# Patient Record
Sex: Female | Born: 1935 | Race: Black or African American | Hispanic: No | Smoking: Never smoker
Health system: Southern US, Community
[De-identification: ages and names within clinical notes are randomized; demographics above are authoritative.]

## PROBLEM LIST (undated history)

## (undated) DIAGNOSIS — N1832 Chronic kidney disease, stage 3b: Secondary | ICD-10-CM

## (undated) DIAGNOSIS — K219 Gastro-esophageal reflux disease without esophagitis: Secondary | ICD-10-CM

## (undated) DIAGNOSIS — I509 Heart failure, unspecified: Secondary | ICD-10-CM

## (undated) DIAGNOSIS — F419 Anxiety disorder, unspecified: Secondary | ICD-10-CM

## (undated) DIAGNOSIS — B351 Tinea unguium: Secondary | ICD-10-CM

## (undated) DIAGNOSIS — Z951 Presence of aortocoronary bypass graft: Secondary | ICD-10-CM

## (undated) DIAGNOSIS — M109 Gout, unspecified: Secondary | ICD-10-CM

## (undated) DIAGNOSIS — Z8701 Personal history of pneumonia (recurrent): Secondary | ICD-10-CM

## (undated) DIAGNOSIS — Z950 Presence of cardiac pacemaker: Secondary | ICD-10-CM

## (undated) DIAGNOSIS — I480 Paroxysmal atrial fibrillation: Secondary | ICD-10-CM

## (undated) DIAGNOSIS — E785 Hyperlipidemia, unspecified: Secondary | ICD-10-CM

## (undated) DIAGNOSIS — N183 Chronic kidney disease, stage 3 unspecified: Secondary | ICD-10-CM

## (undated) DIAGNOSIS — I2119 ST elevation (STEMI) myocardial infarction involving other coronary artery of inferior wall: Secondary | ICD-10-CM

## (undated) DIAGNOSIS — F329 Major depressive disorder, single episode, unspecified: Secondary | ICD-10-CM

## (undated) DIAGNOSIS — R600 Localized edema: Secondary | ICD-10-CM

## (undated) DIAGNOSIS — R51 Headache: Secondary | ICD-10-CM

## (undated) DIAGNOSIS — I255 Ischemic cardiomyopathy: Secondary | ICD-10-CM

## (undated) DIAGNOSIS — E039 Hypothyroidism, unspecified: Secondary | ICD-10-CM

## (undated) DIAGNOSIS — R410 Disorientation, unspecified: Secondary | ICD-10-CM

## (undated) DIAGNOSIS — I2510 Atherosclerotic heart disease of native coronary artery without angina pectoris: Secondary | ICD-10-CM

## (undated) DIAGNOSIS — E1129 Type 2 diabetes mellitus with other diabetic kidney complication: Secondary | ICD-10-CM

## (undated) DIAGNOSIS — I10 Essential (primary) hypertension: Secondary | ICD-10-CM

## (undated) DIAGNOSIS — Z9861 Coronary angioplasty status: Secondary | ICD-10-CM

## (undated) DIAGNOSIS — M1990 Unspecified osteoarthritis, unspecified site: Secondary | ICD-10-CM

## (undated) HISTORY — DX: Chronic kidney disease, stage 3b: N18.32

## (undated) HISTORY — DX: Ischemic cardiomyopathy: I25.5

## (undated) HISTORY — PX: CARDIAC CATHETERIZATION: SHX172

## (undated) HISTORY — DX: Presence of aortocoronary bypass graft: Z95.1

## (undated) HISTORY — DX: Paroxysmal atrial fibrillation: I48.0

## (undated) HISTORY — DX: Coronary angioplasty status: Z98.61

## (undated) HISTORY — DX: Personal history of pneumonia (recurrent): Z87.01

## (undated) HISTORY — DX: Type 2 diabetes mellitus with other diabetic kidney complication: E11.29

## (undated) HISTORY — DX: ST elevation (STEMI) myocardial infarction involving other coronary artery of inferior wall: I21.19

## (undated) HISTORY — DX: Unspecified osteoarthritis, unspecified site: M19.90

## (undated) HISTORY — DX: Essential (primary) hypertension: I10

## (undated) HISTORY — DX: Atherosclerotic heart disease of native coronary artery without angina pectoris: I25.10

## (undated) HISTORY — DX: Gastro-esophageal reflux disease without esophagitis: K21.9

## (undated) HISTORY — DX: Gout, unspecified: M10.9

## (undated) HISTORY — DX: Hyperlipidemia, unspecified: E78.5

## (undated) HISTORY — DX: Localized edema: R60.0

## (undated) HISTORY — PX: JOINT REPLACEMENT: SHX530

## (undated) HISTORY — DX: Chronic kidney disease, stage 3 (moderate): N18.3

## (undated) HISTORY — DX: Tinea unguium: B35.1

---

## 1998-04-29 ENCOUNTER — Other Ambulatory Visit: Admission: RE | Admit: 1998-04-29 | Discharge: 1998-04-29 | Payer: Self-pay | Admitting: *Deleted

## 1998-06-17 ENCOUNTER — Encounter: Payer: Self-pay | Admitting: Endocrinology

## 1998-06-17 ENCOUNTER — Ambulatory Visit (HOSPITAL_COMMUNITY): Admission: RE | Admit: 1998-06-17 | Discharge: 1998-06-17 | Payer: Self-pay | Admitting: Endocrinology

## 1999-04-27 ENCOUNTER — Other Ambulatory Visit: Admission: RE | Admit: 1999-04-27 | Discharge: 1999-04-27 | Payer: Self-pay | Admitting: *Deleted

## 1999-07-26 ENCOUNTER — Encounter: Payer: Self-pay | Admitting: Endocrinology

## 1999-07-26 ENCOUNTER — Encounter: Admission: RE | Admit: 1999-07-26 | Discharge: 1999-07-26 | Payer: Self-pay | Admitting: Endocrinology

## 2000-05-24 ENCOUNTER — Other Ambulatory Visit: Admission: RE | Admit: 2000-05-24 | Discharge: 2000-05-24 | Payer: Self-pay | Admitting: *Deleted

## 2000-07-26 ENCOUNTER — Encounter: Admission: RE | Admit: 2000-07-26 | Discharge: 2000-07-26 | Payer: Self-pay | Admitting: Endocrinology

## 2000-07-26 ENCOUNTER — Encounter: Payer: Self-pay | Admitting: Endocrinology

## 2001-07-20 ENCOUNTER — Encounter: Payer: Self-pay | Admitting: Endocrinology

## 2001-07-20 ENCOUNTER — Encounter: Admission: RE | Admit: 2001-07-20 | Discharge: 2001-07-20 | Payer: Self-pay | Admitting: Endocrinology

## 2002-07-29 ENCOUNTER — Encounter: Payer: Self-pay | Admitting: Endocrinology

## 2002-07-29 ENCOUNTER — Encounter: Admission: RE | Admit: 2002-07-29 | Discharge: 2002-07-29 | Payer: Self-pay | Admitting: Endocrinology

## 2002-12-09 ENCOUNTER — Encounter: Payer: Self-pay | Admitting: Orthopedic Surgery

## 2002-12-09 ENCOUNTER — Encounter: Admission: RE | Admit: 2002-12-09 | Discharge: 2002-12-09 | Payer: Self-pay | Admitting: Orthopedic Surgery

## 2003-05-14 ENCOUNTER — Encounter: Admission: RE | Admit: 2003-05-14 | Discharge: 2003-05-14 | Payer: Self-pay | Admitting: Orthopedic Surgery

## 2003-06-26 ENCOUNTER — Other Ambulatory Visit: Admission: RE | Admit: 2003-06-26 | Discharge: 2003-06-26 | Payer: Self-pay | Admitting: Endocrinology

## 2003-08-20 ENCOUNTER — Encounter: Admission: RE | Admit: 2003-08-20 | Discharge: 2003-08-20 | Payer: Self-pay | Admitting: Endocrinology

## 2003-09-22 ENCOUNTER — Inpatient Hospital Stay (HOSPITAL_COMMUNITY): Admission: RE | Admit: 2003-09-22 | Discharge: 2003-09-26 | Payer: Self-pay | Admitting: Orthopedic Surgery

## 2004-09-09 ENCOUNTER — Encounter: Admission: RE | Admit: 2004-09-09 | Discharge: 2004-09-09 | Payer: Self-pay | Admitting: Endocrinology

## 2005-09-15 ENCOUNTER — Encounter: Admission: RE | Admit: 2005-09-15 | Discharge: 2005-09-15 | Payer: Self-pay | Admitting: Endocrinology

## 2005-12-28 ENCOUNTER — Encounter: Admission: RE | Admit: 2005-12-28 | Discharge: 2005-12-28 | Payer: Self-pay | Admitting: Orthopedic Surgery

## 2006-09-22 ENCOUNTER — Encounter: Admission: RE | Admit: 2006-09-22 | Discharge: 2006-09-22 | Payer: Self-pay | Admitting: Endocrinology

## 2007-01-01 ENCOUNTER — Inpatient Hospital Stay (HOSPITAL_COMMUNITY): Admission: RE | Admit: 2007-01-01 | Discharge: 2007-01-05 | Payer: Self-pay | Admitting: Orthopedic Surgery

## 2007-01-09 ENCOUNTER — Encounter: Admission: RE | Admit: 2007-01-09 | Discharge: 2007-01-09 | Payer: Self-pay | Admitting: Endocrinology

## 2007-09-26 ENCOUNTER — Encounter: Admission: RE | Admit: 2007-09-26 | Discharge: 2007-09-26 | Payer: Self-pay | Admitting: Endocrinology

## 2008-09-30 ENCOUNTER — Encounter: Admission: RE | Admit: 2008-09-30 | Discharge: 2008-09-30 | Payer: Self-pay | Admitting: Endocrinology

## 2009-10-02 ENCOUNTER — Encounter: Admission: RE | Admit: 2009-10-02 | Discharge: 2009-10-02 | Payer: Self-pay | Admitting: Endocrinology

## 2010-07-23 ENCOUNTER — Emergency Department (HOSPITAL_COMMUNITY)
Admission: EM | Admit: 2010-07-23 | Discharge: 2010-07-23 | Payer: Self-pay | Source: Home / Self Care | Admitting: Emergency Medicine

## 2010-07-26 LAB — POCT CARDIAC MARKERS
CKMB, poc: 2.5 ng/mL (ref 1.0–8.0)
Myoglobin, poc: 277 ng/mL (ref 12–200)
Troponin i, poc: <0.05 ng/mL (ref 0.00–0.09)

## 2010-07-26 LAB — DIFFERENTIAL
Basophils Absolute: 0 10*3/uL (ref 0.0–0.1)
Basophils Relative: 1 % (ref 0–1)
Eosinophils Absolute: 0.3 10*3/uL (ref 0.0–0.7)
Eosinophils Relative: 4 % (ref 0–5)
Lymphocytes Relative: 21 % (ref 12–46)
Lymphs Abs: 1.8 10*3/uL (ref 0.7–4.0)
Monocytes Absolute: 0.7 10*3/uL (ref 0.1–1.0)
Monocytes Relative: 8 % (ref 3–12)
Neutro Abs: 5.5 10*3/uL (ref 1.7–7.7)
Neutrophils Relative %: 66 % (ref 43–77)

## 2010-07-26 LAB — CBC
HCT: 43.6 % (ref 36.0–46.0)
Hemoglobin: 14 g/dL (ref 12.0–15.0)
MCH: 26.7 pg (ref 26.0–34.0)
MCHC: 32.1 g/dL (ref 30.0–36.0)
MCV: 83.2 fL (ref 78.0–100.0)
Platelets: 214 10*3/uL (ref 150–400)
RBC: 5.24 MIL/uL — ABNORMAL HIGH (ref 3.87–5.11)
RDW: 16.6 % — ABNORMAL HIGH (ref 11.5–15.5)
WBC: 8.3 10*3/uL (ref 4.0–10.5)

## 2010-07-26 LAB — BASIC METABOLIC PANEL
BUN: 32 mg/dL — ABNORMAL HIGH (ref 6–23)
CO2: 24 mEq/L (ref 19–32)
Calcium: 9.9 mg/dL (ref 8.4–10.5)
Chloride: 108 mEq/L (ref 96–112)
Creatinine, Ser: 1.95 mg/dL — ABNORMAL HIGH (ref 0.4–1.2)
GFR calc Af Amer: 29 mL/min — ABNORMAL LOW (ref >60)
GFR calc non Af Amer: 24 mL/min — ABNORMAL LOW (ref >60)
Glucose, Bld: 199 mg/dL — ABNORMAL HIGH (ref 70–99)
Potassium: 4 mEq/L (ref 3.5–5.1)
Sodium: 143 mEq/L (ref 135–145)

## 2010-07-26 LAB — BRAIN NATRIURETIC PEPTIDE: Pro B Natriuretic peptide (BNP): <30 pg/mL (ref 0.0–100.0)

## 2010-09-06 ENCOUNTER — Other Ambulatory Visit: Payer: Self-pay | Admitting: Endocrinology

## 2010-09-06 DIAGNOSIS — Z1231 Encounter for screening mammogram for malignant neoplasm of breast: Secondary | ICD-10-CM

## 2010-10-06 ENCOUNTER — Ambulatory Visit
Admission: RE | Admit: 2010-10-06 | Discharge: 2010-10-06 | Disposition: A | Discharge status: Home / Self Care | Payer: Medicare Other | Source: Ambulatory Visit | Attending: Endocrinology | Admitting: Endocrinology

## 2010-10-06 DIAGNOSIS — Z1231 Encounter for screening mammogram for malignant neoplasm of breast: Secondary | ICD-10-CM

## 2010-11-23 NOTE — H&P (Signed)
NAMECHAVA, DULAC             ACCOUNT NO.:  [id number]   MEDICAL RECORD NO.:  [id number]          PATIENT TYPE:  INP   LOCATION:  NA                           FACILITY:  [facility]   PHYSICIAN:  Ollen Gross, M.D.    DATE OF BIRTH:  [birth year]   DATE OF ADMISSION:  01/01/2007  DATE OF DISCHARGE:                              HISTORY & PHYSICAL   DATE OF OFFICE VISIT HISTORY AND PHYSICAL:  12/28/2006   CHIEF COMPLAINT:  Right hip pain.   HISTORY OF PRESENT ILLNESS:  The patient is a 77 year old female who has  had ongoing progressive worsening right hip pain.  It has been treated  conservatively in the past.  She has also undergone intra-articular  injections, only help temporarily.  The pain over the past several  months has progressively gotten worse.  She has reached a point where  she would like to have a hip replacement.  She has had the left side  done back in 2005, now comes in to have the other side.   ALLERGIES:  STATIN DRUGS causes muscle pain and ache.   CURRENT MEDICATIONS:  Welchol, Zetia, Benicar,  Toprol XL, furosemide,  glimepiride,  Lotrel, Prevacid, Synthroid.   PAST MEDICAL HISTORY:  Hypercholesterolemia, hypertension, non-insulin-  dependent diabetes mellitus, hypothyroidism.   PAST SURGICAL HISTORY:  D&C approximately 28 years ago, left total hip  replacement 2005.   SOCIAL HISTORY:  Married, retired, nonsmoker.  No alcohol.  Six  children.   FAMILY HISTORY:  Father with history of heart disease.  Mother with  history of breast cancer and hypertension, sister with diabetes.   REVIEW OF SYSTEMS:  GENERAL: No fevers, chills or night sweats.  NEURO:  No seizures, syncope or paralysis.  RESPIRATORY: No shortness of breath,  productive cough or hemoptysis.  CARDIOVASCULAR:  No chest pain or  orthopnea.  GI: No nausea, vomiting, diarrhea  or constipation.  GU: No  dysuria, hematuria or discharge.  MUSCULOSKELETAL: Right hip.   PHYSICAL EXAM:  VITAL SIGNS:  Pulse 56, respirations 12, blood pressure  134/68.  GENERAL: 77 year old African American female, well-nourished, well-  developed, overweight, no acute distress.  She is alert, oriented and  cooperative, pleasant.  HEENT: Normocephalic, atraumatic.  Pupils round and reactive.  Oropharynx clear.  EOMs intact.  NECK:  Supple.  No bruits.  CHEST: Clear anterior, posterior chest walls.  HEART:  Regular rate and rhythm, no murmur, S1-S2 noted.  ABDOMEN: Soft, nontender.  Bowel sounds present.  Rectal and genitalia  not done and pertinent to present illness.  EXTREMITIES:  Right hip flexion 95 degrees zero, internal rotation zero,  external rotation about 20 degrees abduction.   IMPRESSION:  1. Osteoarthritis right hip  2. Hypertension  3. Hypercholesterolemia  4. Hypothyroidism.  5. Non-insulin-dependent diabetes mellitus.   PLAN:  The patient admitted to [facility] to undergo right  total hip replacement arthroplasty.  Surgery will be performed by Ollen Gross. Her medical physician, Dr. Adela Lank, will be notified of  the room number and admission and will be consulted if needed for  medical assistance for  the patient during the hospital course.      Alexzandrew L. Perkins, P.A.C.      Ollen Gross, M.D.  Electronically Signed    ALP/MEDQ  D:  12/31/2006  T:  12/31/2006  Job:  [id number]   cc:   Brooke Bonito, M.D.  Fax: [phone]   Ollen Gross, M.D.  Fax: [phone]

## 2010-11-23 NOTE — Op Note (Signed)
NAMESIERRA, [name]             ACCOUNT NO.:  [id number]   MEDICAL RECORD NO.:  [id number]          PATIENT TYPE:  INP   LOCATION:  0006                         FACILITY:  [facility]   PHYSICIAN:  Ollen Gross, M.D.    DATE OF BIRTH:  [birth year]   DATE OF PROCEDURE:  01/01/2007  DATE OF DISCHARGE:                               OPERATIVE REPORT   PREOPERATIVE DIAGNOSIS:  Osteoarthritis of the right hip.   POSTOPERATIVE DIAGNOSIS:  Osteoarthritis of the right hip.   PROCEDURE:  Right total hip arthroplasty.   SURGEON:  Ollen Gross, M.D.   ASSISTANT:  Alexzandrew L. Perkins, P.A.C.   ANESTHESIA:  General.   BLOOD LOSS:  400.   DRAINS:  None.   COMPLICATIONS:  None.   CONDITION:  Stable to recovery.   BRIEF CLINICAL NOTE:  Ms. Covington is an 77 year old female with severe  end-stage arthritis of the right hip.  She has had a previous successful  left total hip arthroplasty, and presents now for right total hip  arthroplasty.   PROCEDURE IN DETAIL:  After the successful administration of general  anesthetic, the patient was placed in left lateral decubitus position  with the right side up and held with the hip positioner.  The right  lower extremity was isolated from her perineum with plastic drapes, and  prepped and draped in the usual sterile fashion.  A posterolateral  incision was made with #10 blade, through the subcutaneous tissue to the  level of the fascia lata (which was incised in line with the skin  incision).  The sciatic nerve was palpated and protected, and the short  external rotators isolated off the femur.  A capsulectomy was performed  and the hip was dislocated.  The center of the femoral head was marked  and a trial prosthesis placed, such that the center of the trial head  corresponds to the center of native femoral head.  Osteotomy line was  marked on the femoral neck and osteotomy made with an oscillating saw.  Femoral head was removed and then the  femur retracted anteriorly to gain  acetabular exposure.   Acetabular retractors were placed and then the labrum and osteophytes  removed.  Reaming starts at 45 mm, coursing increments of 2 up to 51 mm.  Then a 52 mm Pinnacle acetabular shell was placed in anatomic position  and transfixed with two dome screws.  A trial 32-mm neutral +4 liner was  placed.   The femur was prepared with the canal finder and irrigation.  Axial  reaming was performed to 13.5 mm; proximal reaming to and 18D, and the  sleeve machined to a large.  An 18D large trial and previously placed  with an 18 x 13 stem, with a standard offset neck filling about 10  degrees beyond her native anteversion.  The 32.0 head was placed and the  hip was reduced, with outstanding stability.  There was full extension,  full external rotation to 70 degrees flexion, 40 degrees adduction, 90  degrees internal rotation, 90 degrees of flexion and 70 degrees of  internal rotation.  By placing  the right leg on top of the left it felt  as though the lengths were equal.  The hips were then dislocated and all  trials were removed.  The permanent apex hole eliminator was placed into  the acetabular shell.  The permanent 32 mm neutral +4 Marathon liner was  placed.  On the femoral side, the permanent 18D large sleeve was placed,  with an 18 x 13 stem and a 36 standard neck going 10 degrees beyond  native anteversion.  The 32 +0 head was placed, and the hip was reduced  with the same stability parameters.  The wounds were copiously irrigated  with saline solution, and the short rotators were reattached to the  femur through drill holes.  The fascia lata was closed over a Hemovac  drain with interrupted #1 Vicryl.  The subcutaneous  was closed with #1  and 2-0 Vicryl and subcuticular running 4-0 Monocryl.  The incision was  cleaned and dried, and Steri-Strips and a bulky sterile dressing  applied.  She was then awakened and transported to  recovery in stable  condition.      Ollen Gross, M.D.  Electronically Signed     FA/MEDQ  D:  01/01/2007  T:  01/02/2007  Job:  [id number]

## 2010-11-26 NOTE — Op Note (Signed)
NAME:  [name], [name]                       ACCOUNT NO.:  [id number]   MEDICAL RECORD NO.:  [id number]                   PATIENT TYPE:  INP   LOCATION:  0009                                 FACILITY:  [facility]   PHYSICIAN:  Ollen Gross, M.D.                 DATE OF BIRTH:  October 27, 1920   DATE OF PROCEDURE:  09/22/2003  DATE OF DISCHARGE:                                 OPERATIVE REPORT   PREOPERATIVE DIAGNOSIS:  Osteoarthritis, left hip.   POSTOPERATIVE DIAGNOSIS:  Osteoarthritis, left hip.   PROCEDURE:  Left total hip arthroplasty.   SURGEON:  Gus Rankin. Aluisio, M.D.   ASSISTANT:  Avel Peace, P.A.   ANESTHESIA:  General.   ESTIMATED BLOOD LOSS:  400.   DRAIN:  Hemovac x 1.   COMPLICATIONS:  None.   CONDITION:  Stable to recovery.   BRIEF CLINICAL NOTE:  Ms. Acres is an 77 year old female with severe end-  stage osteoarthritis of the left hip with pain refractory to nonoperative  management.  She presents now for left total hip arthroplasty.   PROCEDURE IN DETAIL:  After the successful administration of general  anesthetic, the patient is placed in the right lateral decubitus position  with the left side up and held with the hip positioner.  The left lower  extremity is isolated from her perineum with plastic drapes and prepped and  draped in the usual sterile fashion.  Standard posterolateral incision is  made with a 10 blade through a very thick layer of subcutaneous tissue down  to the level of the fascia lata which is incised in line with the skin  incision.  The sciatic nerve is palpated and protected, and the short  external rotator is isolated off the femur.  The capsulectomy is then  performed and the hip dislocated.  Center of the femoral head is marked and  the trial prosthesis placed such that the center of the trial head  corresponds to the center of her native femoral head.  Osteotomy line is  marked on the femoral neck and osteotomy made with an  oscillating saw.  The  femoral head is removed and then the femur retracted anteriorly to gain  acetabular exposure.   Acetabular exposure is obtained.  The retractor is placed.  The labrum and  osteophytes are removed.  Reaming started at 47 mm and coursing up in  increments of 2 to 53.  A 54 mm Pinnacle acetabular shell is placed in  anatomic position and transfixed with two dome screws.  Trial 32 mm neutral  liner is placed.   Femur is prepared first with the canal finder and then irrigation.  Axial  reaming is performed to 13.5 mm, proximal reaming to an 18D, and the sleeve  machined to a large.  An 18D large trial sleeve is placed, with an 18 x 13  stem and a 36 plus 8 neck.  Her version was  relatively neutral, so I added  10 degrees of anteversion.  We then placed a 32 plus 0 trial head and  reduced the hip.  With the 36 plus 8, there was too much offset, and we had  difficult reduction.  We went to a 36 standard which led to a more anatomic  reduction.  She had outstanding stability, full extension, full external  rotation, 70 degrees flexion, 40 degrees adduction, 90 degrees internal  rotation, and 90 degrees flexion, 70 degrees internal rotation.  By placing  the left leg on top of the right, her leg lengths were equal.  All trials  are then removed, and the permanent apex hole eliminator is placed into the  acetabular shell.  The 32 mm neutral Marathon liner is then placed.  The 18D  large sleeve is then placed into the proximal femur with 18 x 13 stem with a  standard neck.  Once again, we went about 10 degrees beyond her native  anteversion.  We then placed a 32 plus 0 head and reduced the hip with the  same stability parameters.  The wound is copiously irrigated with antibiotic  solution, and short rotator is reattached to the femur through drill holes.  Fascia lata is closed over a Hemovac drain with interrupted #1 Vicryl, subcu  closed with #1 and then 2-0 Vicryl, and  subcuticular with running 4-0  Monocryl.  Then 20 mL of 0.25% Marcaine with epinephrine are injected into  the subcutaneous tissues.  Steri-Strips and a bulky sterile dressing are  applied, and then the drain is hooked to the Vacutainer and bulky sterile  dressing applied, and she is placed into a knee immobilizer, awakened, and  transported to recovery in stable condition.                                               Ollen Gross, M.D.    FA/MEDQ  D:  09/22/2003  T:  09/22/2003  Job:  [id number]

## 2010-11-26 NOTE — Discharge Summary (Signed)
[name], [name]             ACCOUNT NO.:  [id number]   MEDICAL RECORD NO.:  [id number]          PATIENT TYPE:  INP   LOCATION:  1620                         FACILITY[facility]   PHYSICIAN:  Ollen Gross, M.D.    DATE OF BIRTH:  1921-02-25   DATE OF ADMISSION:  01/01/2007  DATE OF DISCHARGE:  01/05/2007                               DISCHARGE SUMMARY   ADMITTING DIAGNOSES:  1. Osteoarthritis of right hip.  2. Hypertension.  3. Hypercholesterolemia.  4. Hypothyroidism.  5. Non-insulin-dependent diabetes mellitus.   DISCHARGE DIAGNOSES:  1. Osteoarthritis of right hip, status post right total hip      arthroplasty.  2. Mild postoperative blood loss anemia.  3. Preexisting renal insufficiency with acute failure, improved.  4. Hypertension.  5. Hypercholesterolemia.  6. Hypothyroidism.  7. Non-insulin-dependent diabetes mellitus.  8. Postoperative atelectasis.   PROCEDURE:  January 01, 2007, right total hip; surgeon, Dr. Lequita Halt;  assistant, Avel Peace PA-C; anesthesia, general.   CONSULTS:  None.   BRIEF HISTORY:  [name] is an 85-year female with severe end-stage  arthritis of the right hip, who has a previous successful left total hip  and now presents for right total hip.   LABORATORY DATA:  Preop CBC showed a hemoglobin of 14.2, hematocrit of  43.4, white cell count 7.5; postop hemoglobin 11.5 and drifted down to  10, then to 9.8; last noted H&H were 9.3 and 27.4.  PT and PTT preop  were 15 and 34, respectively.  INR 1.2.  Serial pro times were followed;  last noted PT/INR 24.6 and 2.1.  Chemistry panel on admission did have  elevated glucose of 151, BUN and creatinine elevated both at 40 and  2.07, indicating some chronic renal insufficiency, remaining chemistry  panel within normal limits.  Serial BMETs were followed; sodium did drop  from 144 to 132 and last noted at 131.  BUN went from 40 and got as high  as 49, back down to 46; creatinine started to 2.07  and got as high as to  2.9 and back down to 2.38.  Preop UA negative.  Followup UA:  Moderate  leukocyte esterase, but only 0-2 white cells, 3-6 red cells and large  hemoglobin.  Blood group type B positive.   EKG dated January 24, 2007 is normal sinus rhythm, normal EKG, no  significant change since compared to September 15, 2003, confirmed by Dr.  Arvilla Meres.   Right hip films, December 25, 2006:  Severe right hip degenerative changes.   Two-view chest, December 25, 2006:  No acute cardiopulmonary disease with  mild cardiomegaly noted.  Bulky right carotid artery calcification.   Portable chest and pelvis, January 01, 2007:  Bilateral hip replacements  appear in satisfactory position.   Portable chest, January 03, 2007:  Very low lung volumes, right midlung,  and basilar subsegmental atelectasis.   HOSPITAL COURSE:  The patient was admitted to [facility].  She  tolerated the procedure well and later transferred to the recovery room  and orthopedic floor, started on PCA and p.o. analgesic for pain control  following the  surgery.  She did fairly well on the evening of surgery.  On the morning of day #1, did have some pain and discomfort, initially  was placed on PCA, but that was discontinued on postop day #1.  Her BUN  and creatinine had gone up on day #1.  She had a little bit of  preexisting renal insufficiency, so we held her benazepril and treated  her with fluids.  She had a little bit of low output on the morning of  day #1 also, so this was monitored closely with strict I's and O's.  By  day #2, the BUN and creatinine had gone up a little bit more and she had  had a hypoglycemic episode with her diabetes.  We checked chest x-ray  and it only showed some subsegmental atelectasis.  We continued the  fluids and held the medications.  The BUN and creatinine got as high as  49 and 2.9 at the highest point.  Dressing was changed; incision looked  good.  By day #3, she started getting  up, doing a little bit better.  The BUN and creatinine had gotten to 49 and 2.9, which were their  highest point, started diuresing off fluid and her BUN and creatinine  started to improve.  She had another hypoglycemic effect, day #2, day #3  and day #4, and we held the insulin for her diabetes.  From a therapy  standpoint, she started getting up and improving her mobility, becoming  more independent.  By postop day #4, she was doing better; her BUN and  creatinine had improved.  We stopped the fluids.  She had a hypoglycemic  effect, so we changed her medications and recommended holding her  insulin at home.  She actually did pretty well with her therapy and  sugar had responded to appropriate treatment for the hypoglycemia.  We  held her medications, she was doing better and we decided the patient  could be transferred home at that time, later that day.   DISCHARGE PLAN:  1. The patient was discharged home on January 05, 2007.  2. Discharge diagnoses:  Please see above.  3. Discharge medications:  Coumadin, Vicodin, Robaxin.  Her blood      pressure medications are currently on hold.  4. Diet:  Diabetic cardiac diet.   FOLLOWUP:  Follow up in 2 weeks, the week of July 7 through the 10th;  follow up with the PA in the office for followup care.   ACTIVITY:  Partial weightbearing, 25% to 50%, right lower extremity.  Home health PT and home health nursing.  Total hip protocol hip  precautions.   SPECIAL DISCHARGE INSTRUCTIONS:  We are going to recheck her BMET and  her hemoglobin on Monday, outpatient labs to follow up the postop anemia  and also to ensure that her BUN and creatinine are continuing to improve   DISPOSITION:  Home.   CONDITION ON DISCHARGE:  Improved.      Alexzandrew L. Perkins, P.A.C.      Ollen Gross, M.D.  Electronically Signed    ALP/MEDQ  D:  02/08/2007  T:  02/09/2007  Job:  [id number]   cc:   Brooke Bonito, M.D.  Fax: [phone]

## 2010-11-26 NOTE — H&P (Signed)
NAME:  MARCELL, PFEIFER                       ACCOUNT NO.:  [id number]   MEDICAL RECORD NO.:  [id number]                   PATIENT TYPE:  INP   LOCATION:  0460                                 FACILITY:  [facility]   PHYSICIAN:  Ollen Gross, M.D.                 DATE OF BIRTH:  Dec 03, 1920   DATE OF ADMISSION:  09/22/2003  DATE OF DISCHARGE:                                HISTORY & PHYSICAL   CHIEF COMPLAINT:  Left hip pain.   HISTORY OF PRESENT ILLNESS:  The patient is an 77 year old female seen by  Dr. Lequita Halt for ongoing left hip pain.  The pain has been progressively  getting worse to the point where she is having a difficult time walking and  getting up out of bed.  She is having some functional limitations due to her  arthritis.  She is seen in the office where x-rays show erosive end-stage  bone-on-bone changes in the left hip with a large superior lateral  osteophyte formation.  She is at a point to where she would like to have  something done about it.  Risks and benefits of a total hip procedure have  been discussed with the patient and she elected to proceed with surgery.   ALLERGIES:  There is a couple of CHOLESTEROL MEDICATIONS that she does not  TOLERATE.  She thinks one of them is BAYCOL which turned her urine a  different color and caused a rash.  Also CRESTOR caused nausea and rash.   CURRENT MEDICATIONS:  1. Amaryl 2 mg daily.  2. Toprol-XL 100 mg daily.  3. Atacand 16/12.5 mg daily.  4. WelChol 625 mg six tablets a day.  5. Zetia 10 mg daily.  6. Aspirin stopped prior to surgery.  7. Prevacid NapraPAC 500 mg three tablets a day:  Two in the morning and one     in the evening.   PAST MEDICAL HISTORY:  1. Hypertension.  2. Non-insulin-dependent diabetes mellitus.  3. History of ankle fracture.  4. Osteoarthritis.   PAST SURGICAL HISTORY:  She has had a D&C approximately 25 years ago.   SOCIAL HISTORY:  Married, retired, nonsmoker, no alcohol.  Has 5  children.  Her daughter will be assisting with care after surgery.  Lives in a one-  story home with 4 to 5 steps entering.   FAMILY HISTORY:  Father deceased age 93 with heart disease.  Mother deceased  age 20 with hypertension and breast cancer.   REVIEW OF SYSTEMS:  GENERAL:  No fevers, chills, night sweats.  NEUROLOGICAL:  No seizures, syncope, paralysis.  RESPIRATORY:  She does have  some shortness of breath on exertion.  No shortness of breath at rest.  Some  occasional trouble breathing lying flat.  No hemoptysis.  CARDIOVASCULAR:  No chest pain or angina.  GI:  No nausea, vomiting, diarrhea, constipation.  No blood or mucus in the stool.  GU:  No dysuria, hematuria, or discharge.  MUSCULOSKELETAL:  Pertinent to that of the hip found in the History of  Present Illness.   PHYSICAL EXAMINATION:  VITAL SIGNS:  Pulse 60, respirations 12, blood  pressure 200/82.  GENERAL:  An 77 year old African-American female, well nourished, well  developed, overweight.  In no acute distress.  She is accompanied by her  family.  HEENT:  Normocephalic, atraumatic.  Pupils round and reactive.  EOMs intact.  Noted to wear glasses.  NECK:  Supple.  CHEST:  Clear anterior and posterior chest walls.  No rhonchi, rales, or  wheezing.  HEART:  Regular rate and rhythm.  No murmurs.  ABDOMEN:  Soft, round, protuberant abdomen.  Bowel sounds are present.  RECTAL, BREASTS, GENITALIA:  Not done and not pertinent to present illness.  EXTREMITIES:  Left lower extremity hip flexion only is 100 degrees.  No  internal rotation, only 10 degrees of external rotation.  Abduction about 10  degrees.  She does ambulate with an antalgic gait.   IMPRESSION:  1. Osteoarthritis left hip.  2. Hypertension.  3. Hypercholesterolemia.  4. Non-insulin-dependent diabetes mellitus.   PLAN:  The patient will be admitted to [facility] to undergo a  left total hip arthroplasty.  Surgery will be performed by Dr.  Ollen Gross.  The patient's medical doctor is Dr. Juleen China.  Dr. Juleen China will be  notified of the room number and will be consulted if needed for any medical  assistance with the patient throughout the hospital course.     Alexzandrew L. Julien Girt, P.A.              Ollen Gross, M.D.    ALP/MEDQ  D:  09/23/2003  T:  09/24/2003  Job:  [id number]   cc:   Brooke Bonito, M.D.  [address]  [place]  [place] [address]  Fax: [phone]

## 2010-11-26 NOTE — Discharge Summary (Signed)
NAME:  [name], [name]                       ACCOUNT NO.:  [id number]   MEDICAL RECORD NO.:  [id number]                   PATIENT TYPE:  INP   LOCATION:  0460                                 FACILITY:  [facility]   PHYSICIAN:  Ollen Gross, M.D.                 DATE OF BIRTH:  07-Jan-1921   DATE OF ADMISSION:  09/22/2003  DATE OF DISCHARGE:  09/26/2003                                 DISCHARGE SUMMARY   ADMITTING DIAGNOSES:  1. Osteoarthritis of the left hip.  2. Hypertension.  3. Hypercholesterolemia.  4. Noninsulin-dependent diabetes mellitus.   DISCHARGE DIAGNOSIS:  1. Osteoarthritis of the left hip status post left total hip arthroplasty.  2. Mild postoperative blood loss anemia.  Did not require transfusion.  3. Hypertension.  4. Hypercholesterolemia.  5. Noninsulin-dependent diabetes mellitus.   PROCEDURE:  Date of surgery September 22, 2003 - left total hip arthroplasty.  Surgeon - Dr. Homero Fellers Aluisio.  Assistant - Avel Peace, P.A.C.  Anesthesia -  general.  Blood loss - 400 cc.  Hemovac drain x1.   BRIEF HISTORY:  [name] is an 77 year old female with severe end-stage  arthritis of the left hip that has been refractory to nonoperative  management, who now presents for left hip arthroplasty.   LABORATORY DATA:  CBC preoperative revealed hemoglobin of 14.4, hematocrit  of 43.5, white cell count 6.3, red cell count 5.3.  Differential had  elevated monos at 13.  The remaining differential within normal limits.  PT  and PTT preoperatively were 13.9 and 35 respectively.  INR of 1.1.  Serial  pro time was followed.  Last noted PT and INR were 20.3 and 2.2.  Chem panel  on admission revealed an elevated sodium preoperatively, dated September 15, 2003.  Elevated sodium was 146.  High calcium of 10.7.  Serial BMET's were  followed.  Sodium was normal during the hospital course.  Glucose went up  from 113 to 131 and then back down to 125.  Urinalysis on admission was  negative.  Blood  group type B positive.  Doppler study report on the chart  dated October 01, 2003.  No evidence of DVT, SVT, or Baker's cyst bilaterally.  EKG dated September 15, 2003 revealed normal sinus rhythm, nonspecific T wave  abnormalities.  Cannot exclude prior septal MI.  No previous tracings.  Confirms by Dr. Olga Millers.  Left hip film dated September 15, 2003 revealed  bilateral degenerative changes of the hips, left worse than right.  Two-view  of chest revealed borderline heart size with mild vascular congestion.  Portable hip film and pelvis film on September 22, 2003 revealed anatomic  __________ status post left total hip arthroplasty without acute  complicating features.   HOSPITAL COURSE:  The patient was admitted to [facility], taken to  the OR, and underwent the above-stated procedure without complication.  The  patient tolerated the procedure well, and later  was transferred to the  recovery room, and then to the orthopedic floor to continue postoperative  care.  Vital signs were followed.  The patient was given 24 hours of  postoperative IV antibiotics in the form of Ancef, placed on Coumadin,  started back on home medications, PCA and p.o. analgesic for pain control  following surgery.  PT and OT were consulted postoperatively.  The patient  was placed weightbearing as tolerated.  Hemovac drain placed at the time of  surgery was pulled on postoperative day #1 without difficulty.  She had some  mild volume overload, underwent some mild diuresis.  I&O's improved by day  #2.  She did have a little bit of left calf pain on the evening of day #1  and into day #2.  PCA's and IV's were discontinued.  Doppler study was  ordered.  Doppler study did prove to be negative during the hospital course.  By day #3, she was already feeling a little bit better, had less pain,  encouraged mobility.  From a therapy standpoint, she had actually progressed  very well.  She was up ambulating approximately  65 feet by day #2, and then  up to 120 feet by day #3.  She progressed so well, that it was decided she  could be discharged home on day #4.  The dressing was changed on  postoperative day #2, and by the time she was discharged home, the incision  was healing well.   DISCHARGE PLAN:  1. The patient was discharged home on September 26, 2003.  2. Discharge diagnosis - please see above.   DISCHARGE MEDICATIONS:  1. Percocet.  2. Robaxin.  3. Coumadin.   DIET:  Low-sodium, diabetic diet.   FOLLOW UP:  Follow up in 2 weeks.  Call for an appointment.   ACTIVITY:  Weightbearing as tolerated.  Home PT, home health nursing, hip  precautions.   DISPOSITION:  Home.   CONDITION ON DISCHARGE:  Improved.     Alexzandrew L. Julien Girt, P.A.              Ollen Gross, M.D.    ALP/MEDQ  D:  10/31/2003  T:  10/31/2003  Job:  [id number]   cc:   Brooke Bonito, M.D.  [address]  [place]  [place] [address]  Fax: [phone]

## 2011-04-27 LAB — URINALYSIS, ROUTINE W REFLEX MICROSCOPIC
Bilirubin Urine: NEGATIVE
Glucose, UA: NEGATIVE
Hgb urine dipstick: NEGATIVE
Ketones, ur: NEGATIVE
Nitrite: NEGATIVE
Protein, ur: NEGATIVE
Protein, ur: NEGATIVE
Urobilinogen, UA: 0.2
Urobilinogen, UA: 0.2

## 2011-04-27 LAB — CROSSMATCH: ABO/RH(D): B POS

## 2011-04-27 LAB — BASIC METABOLIC PANEL
BUN: 43 — ABNORMAL HIGH
BUN: 44 — ABNORMAL HIGH
BUN: 46 — ABNORMAL HIGH
BUN: 49 — ABNORMAL HIGH
BUN: 49 — ABNORMAL HIGH
CO2: 23
CO2: 25
Calcium: 8.5
Calcium: 8.6
Calcium: 9
Calcium: 9.1
Chloride: 104
Chloride: 107
Creatinine, Ser: 2.18 — ABNORMAL HIGH
Creatinine, Ser: 2.26 — ABNORMAL HIGH
Creatinine, Ser: 2.33 — ABNORMAL HIGH
Creatinine, Ser: 2.59 — ABNORMAL HIGH
GFR calc non Af Amer: 16 — ABNORMAL LOW
GFR calc non Af Amer: 19 — ABNORMAL LOW
GFR calc non Af Amer: 20 — ABNORMAL LOW
Glucose, Bld: 156 — ABNORMAL HIGH
Glucose, Bld: 185 — ABNORMAL HIGH
Glucose, Bld: 46 — ABNORMAL LOW
Glucose, Bld: 53 — ABNORMAL LOW
Glucose, Bld: 74
Potassium: 4
Potassium: 5
Sodium: 132 — ABNORMAL LOW

## 2011-04-27 LAB — CBC
HCT: 27.4 — ABNORMAL LOW
HCT: 43.4
Hemoglobin: 9.3 — ABNORMAL LOW
MCHC: 32.7
MCHC: 33.6
MCHC: 33.7
MCHC: 34.1
MCV: 80.1
MCV: 80.2
MCV: 81.2
Platelets: 205
Platelets: 208
Platelets: 224
Platelets: 229
Platelets: 260
RBC: 4.26
RDW: 14.6 — ABNORMAL HIGH
RDW: 14.7 — ABNORMAL HIGH
RDW: 14.8 — ABNORMAL HIGH
WBC: 10.7 — ABNORMAL HIGH

## 2011-04-27 LAB — COMPREHENSIVE METABOLIC PANEL
AST: 25
Albumin: 3.6
BUN: 40 — ABNORMAL HIGH
CO2: 26
Calcium: 10.2
Chloride: 108
Creatinine, Ser: 2.07 — ABNORMAL HIGH
GFR calc Af Amer: 28 — ABNORMAL LOW
GFR calc non Af Amer: 23 — ABNORMAL LOW
Total Bilirubin: 1.2

## 2011-04-27 LAB — PROTIME-INR
INR: 1.2
INR: 1.2
INR: 1.4
INR: 1.9 — ABNORMAL HIGH
INR: 2.1 — ABNORMAL HIGH
Prothrombin Time: 15.5 — ABNORMAL HIGH
Prothrombin Time: 17.1 — ABNORMAL HIGH

## 2011-04-27 LAB — APTT: aPTT: 34

## 2011-05-12 HISTORY — PX: CORONARY ANGIOPLASTY: SHX604

## 2011-05-26 ENCOUNTER — Encounter: Payer: Self-pay | Admitting: Emergency Medicine

## 2011-05-26 ENCOUNTER — Other Ambulatory Visit: Payer: Self-pay

## 2011-05-26 ENCOUNTER — Encounter (HOSPITAL_COMMUNITY)
Admission: EM | Disposition: A | Discharge status: Home Health | Payer: Self-pay | Source: Ambulatory Visit | Attending: Cardiology

## 2011-05-26 ENCOUNTER — Inpatient Hospital Stay (HOSPITAL_COMMUNITY)
Admission: EM | Admit: 2011-05-26 | Discharge: 2011-06-01 | DRG: 251 | Disposition: A | Discharge status: Home Health | Payer: Medicare Other | Source: Ambulatory Visit | Attending: Cardiology | Admitting: Cardiology

## 2011-05-26 DIAGNOSIS — I440 Atrioventricular block, first degree: Secondary | ICD-10-CM | POA: Diagnosis present

## 2011-05-26 DIAGNOSIS — L270 Generalized skin eruption due to drugs and medicaments taken internally: Secondary | ICD-10-CM | POA: Diagnosis not present

## 2011-05-26 DIAGNOSIS — N179 Acute kidney failure, unspecified: Secondary | ICD-10-CM | POA: Diagnosis present

## 2011-05-26 DIAGNOSIS — I129 Hypertensive chronic kidney disease with stage 1 through stage 4 chronic kidney disease, or unspecified chronic kidney disease: Secondary | ICD-10-CM | POA: Diagnosis present

## 2011-05-26 DIAGNOSIS — M109 Gout, unspecified: Secondary | ICD-10-CM | POA: Diagnosis present

## 2011-05-26 DIAGNOSIS — I213 ST elevation (STEMI) myocardial infarction of unspecified site: Secondary | ICD-10-CM | POA: Diagnosis present

## 2011-05-26 DIAGNOSIS — I2582 Chronic total occlusion of coronary artery: Secondary | ICD-10-CM | POA: Diagnosis present

## 2011-05-26 DIAGNOSIS — E039 Hypothyroidism, unspecified: Secondary | ICD-10-CM | POA: Diagnosis present

## 2011-05-26 DIAGNOSIS — E785 Hyperlipidemia, unspecified: Secondary | ICD-10-CM | POA: Diagnosis present

## 2011-05-26 DIAGNOSIS — Z8739 Personal history of other diseases of the musculoskeletal system and connective tissue: Secondary | ICD-10-CM

## 2011-05-26 DIAGNOSIS — T466X5A Adverse effect of antihyperlipidemic and antiarteriosclerotic drugs, initial encounter: Secondary | ICD-10-CM | POA: Diagnosis not present

## 2011-05-26 DIAGNOSIS — I2119 ST elevation (STEMI) myocardial infarction involving other coronary artery of inferior wall: Principal | ICD-10-CM | POA: Diagnosis present

## 2011-05-26 DIAGNOSIS — Z7982 Long term (current) use of aspirin: Secondary | ICD-10-CM

## 2011-05-26 DIAGNOSIS — I2510 Atherosclerotic heart disease of native coronary artery without angina pectoris: Secondary | ICD-10-CM | POA: Diagnosis present

## 2011-05-26 DIAGNOSIS — Z96649 Presence of unspecified artificial hip joint: Secondary | ICD-10-CM

## 2011-05-26 DIAGNOSIS — Y9210 Unspecified residential institution as the place of occurrence of the external cause: Secondary | ICD-10-CM | POA: Diagnosis not present

## 2011-05-26 DIAGNOSIS — I110 Hypertensive heart disease with heart failure: Secondary | ICD-10-CM | POA: Diagnosis present

## 2011-05-26 DIAGNOSIS — E1129 Type 2 diabetes mellitus with other diabetic kidney complication: Secondary | ICD-10-CM | POA: Diagnosis present

## 2011-05-26 DIAGNOSIS — E119 Type 2 diabetes mellitus without complications: Secondary | ICD-10-CM | POA: Diagnosis present

## 2011-05-26 DIAGNOSIS — Z7902 Long term (current) use of antithrombotics/antiplatelets: Secondary | ICD-10-CM

## 2011-05-26 DIAGNOSIS — Z23 Encounter for immunization: Secondary | ICD-10-CM

## 2011-05-26 DIAGNOSIS — N183 Chronic kidney disease, stage 3 unspecified: Secondary | ICD-10-CM | POA: Diagnosis present

## 2011-05-26 DIAGNOSIS — Z79899 Other long term (current) drug therapy: Secondary | ICD-10-CM

## 2011-05-26 HISTORY — DX: Hypothyroidism, unspecified: E03.9

## 2011-05-26 HISTORY — DX: Anxiety disorder, unspecified: F41.9

## 2011-05-26 HISTORY — DX: Headache: R51

## 2011-05-26 HISTORY — PX: PERCUTANEOUS CORONARY INTERVENTION-BALLOON ONLY: SHX6014

## 2011-05-26 HISTORY — PX: LEFT HEART CATHETERIZATION WITH CORONARY ANGIOGRAM: SHX5451

## 2011-05-26 SURGERY — LEFT HEART CATHETERIZATION WITH CORONARY ANGIOGRAM
Anesthesia: LOCAL

## 2011-05-26 MED ORDER — HEPARIN SODIUM (PORCINE) 5000 UNIT/ML IJ SOLN
INTRAMUSCULAR | Status: AC
  Administered 2011-05-26: 4000 [IU]
  Filled 2011-05-26: qty 1

## 2011-05-26 MED ORDER — HEPARIN (PORCINE) IN NACL 100-0.45 UNIT/ML-% IJ SOLN
900.0000 [IU]/h | INTRAMUSCULAR | Status: DC
  Administered 2011-05-27: 900 [IU]/h via INTRAVENOUS
  Filled 2011-05-26: qty 250

## 2011-05-26 MED ORDER — NITROGLYCERIN 0.2 MG/ML ON CALL CATH LAB
INTRAVENOUS | Status: AC
  Filled 2011-05-26: qty 1

## 2011-05-26 MED ORDER — ASPIRIN EC 81 MG PO TBEC: 81.0000 mg | DELAYED_RELEASE_TABLET | Freq: Every day | ORAL | Status: DC

## 2011-05-26 MED ORDER — LIDOCAINE HCL (PF) 1 % IJ SOLN
INTRAMUSCULAR | Status: AC
  Filled 2011-05-26: qty 30

## 2011-05-26 MED ORDER — GLIMEPIRIDE 2 MG PO TABS
2.0000 mg | ORAL_TABLET | Freq: Every day | ORAL | Status: DC
Start: 2011-05-27 — End: 2011-06-01
  Administered 2011-05-28 – 2011-06-01 (×3): 2 mg via ORAL
  Filled 2011-05-26 (×7): qty 1

## 2011-05-26 MED ORDER — BENAZEPRIL HCL 20 MG PO TABS
20.0000 mg | ORAL_TABLET | Freq: Every day | ORAL | Status: DC
  Filled 2011-05-26: qty 1

## 2011-05-26 MED ORDER — EPTIFIBATIDE 75 MG/100ML IV SOLN
1.0000 ug/kg/min | INTRAVENOUS | Status: AC
  Administered 2011-05-27 (×2): 1 ug/kg/min via INTRAVENOUS
  Filled 2011-05-26 (×3): qty 100

## 2011-05-26 MED ORDER — METOPROLOL TARTRATE 50 MG PO TABS
50.0000 mg | ORAL_TABLET | Freq: Two times a day (BID) | ORAL | Status: DC
  Administered 2011-05-27 – 2011-06-01 (×11): 50 mg via ORAL
  Filled 2011-05-26 (×13): qty 1

## 2011-05-26 MED ORDER — FUROSEMIDE 40 MG PO TABS
40.0000 mg | ORAL_TABLET | Freq: Two times a day (BID) | ORAL | Status: DC
  Administered 2011-05-27 (×2): 40 mg via ORAL
  Filled 2011-05-26 (×6): qty 1

## 2011-05-26 MED ORDER — NITROGLYCERIN IN D5W 200-5 MCG/ML-% IV SOLN: 3.0000 ug/min | INTRAVENOUS | Status: DC

## 2011-05-26 MED ORDER — FENTANYL CITRATE 0.05 MG/ML IJ SOLN
INTRAMUSCULAR | Status: AC
  Filled 2011-05-26: qty 2

## 2011-05-26 MED ORDER — ALLOPURINOL 100 MG PO TABS
100.0000 mg | ORAL_TABLET | Freq: Every day | ORAL | Status: DC
  Administered 2011-05-27 – 2011-06-01 (×6): 100 mg via ORAL
  Filled 2011-05-26 (×6): qty 1

## 2011-05-26 MED ORDER — BIVALIRUDIN 250 MG IV SOLR
INTRAVENOUS | Status: AC
  Filled 2011-05-26: qty 250

## 2011-05-26 MED ORDER — HEPARIN (PORCINE) IN NACL 100-0.45 UNIT/ML-% IJ SOLN
INTRAMUSCULAR | Status: AC
  Filled 2011-05-26: qty 250

## 2011-05-26 MED ORDER — INSULIN ASPART 100 UNIT/ML ~~LOC~~ SOLN
0.0000 [IU] | SUBCUTANEOUS | Status: DC
  Administered 2011-05-27: 0 [IU] via SUBCUTANEOUS
  Administered 2011-05-27 (×2): 1 [IU] via SUBCUTANEOUS
  Administered 2011-05-27 (×2): 2 [IU] via SUBCUTANEOUS
  Administered 2011-05-28: 1 [IU] via SUBCUTANEOUS
  Filled 2011-05-26 (×2): qty 3

## 2011-05-26 MED ORDER — LEVOTHYROXINE SODIUM 100 MCG PO TABS
100.0000 ug | ORAL_TABLET | Freq: Every day | ORAL | Status: DC
  Administered 2011-05-27 – 2011-06-01 (×6): 100 ug via ORAL
  Filled 2011-05-26 (×6): qty 1

## 2011-05-26 MED ORDER — NITROGLYCERIN 0.4 MG SL SUBL: 0.4000 mg | SUBLINGUAL_TABLET | SUBLINGUAL | Status: DC | PRN

## 2011-05-26 MED ORDER — SODIUM CHLORIDE 0.9 % IV BOLUS (SEPSIS)
250.0000 mL | Freq: Once | INTRAVENOUS | Status: AC
  Administered 2011-05-26: 250 mL via INTRAVENOUS

## 2011-05-26 MED ORDER — ONDANSETRON HCL 4 MG/2ML IJ SOLN
4.0000 mg | Freq: Four times a day (QID) | INTRAMUSCULAR | Status: DC | PRN
  Administered 2011-05-27: 4 mg via INTRAVENOUS
  Filled 2011-05-26: qty 2

## 2011-05-26 MED ORDER — ASPIRIN 300 MG RE SUPP
300.0000 mg | RECTAL | Status: AC
  Filled 2011-05-26: qty 1

## 2011-05-26 MED ORDER — AMLODIPINE BESYLATE 10 MG PO TABS
10.0000 mg | ORAL_TABLET | Freq: Every day | ORAL | Status: DC
  Administered 2011-05-27 – 2011-06-01 (×6): 10 mg via ORAL
  Filled 2011-05-26 (×6): qty 1

## 2011-05-26 MED ORDER — HEPARIN (PORCINE) IN NACL 2-0.9 UNIT/ML-% IJ SOLN
INTRAMUSCULAR | Status: AC
  Filled 2011-05-26: qty 2000

## 2011-05-26 MED ORDER — DEXTROSE-NACL 5-0.9 % IV SOLN
INTRAVENOUS | Status: DC
  Administered 2011-05-27 (×2): via INTRAVENOUS

## 2011-05-26 MED ORDER — EPTIFIBATIDE 75 MG/100ML IV SOLN
INTRAVENOUS | Status: AC
  Administered 2011-05-27: 1 ug/kg/min via INTRAVENOUS
  Filled 2011-05-26: qty 100

## 2011-05-26 MED ORDER — ZOLPIDEM TARTRATE 5 MG PO TABS: 5.0000 mg | ORAL_TABLET | Freq: Every evening | ORAL | Status: DC | PRN

## 2011-05-26 MED ORDER — EZETIMIBE 10 MG PO TABS
10.0000 mg | ORAL_TABLET | Freq: Every day | ORAL | Status: DC
  Administered 2011-05-28 – 2011-06-01 (×5): 10 mg via ORAL
  Filled 2011-05-26 (×6): qty 1

## 2011-05-26 MED ORDER — LORAZEPAM 0.5 MG PO TABS
0.5000 mg | ORAL_TABLET | Freq: Three times a day (TID) | ORAL | Status: DC
  Administered 2011-05-27 – 2011-06-01 (×16): 0.5 mg via ORAL
  Filled 2011-05-26 (×16): qty 1

## 2011-05-26 MED ORDER — SODIUM BICARBONATE 650 MG PO TABS
650.0000 mg | ORAL_TABLET | Freq: Every day | ORAL | Status: DC
  Administered 2011-05-27 – 2011-06-01 (×6): 650 mg via ORAL
  Filled 2011-05-26 (×6): qty 1

## 2011-05-26 MED ORDER — ACETAMINOPHEN 325 MG PO TABS: 650.0000 mg | ORAL_TABLET | ORAL | Status: DC | PRN

## 2011-05-26 NOTE — H&P (Signed)
[name] is an 77 y.o. female.   Chief Complaint:  Chest Pain STEMI HPI:  77 year old female with hx of htn and DM as well as dyslipidemia with 1-2 hours of substernal chest heaviness.  No previous cardiac history.  Past Medical History  Diagnosis Date  . Diabetes mellitus   . Hypertension     History reviewed. No pertinent past surgical history.  History reviewed. No pertinent family history. Social History:  does not have a smoking history on file. She does not have any smokeless tobacco history on file. Her alcohol and drug histories not on file.  Allergies: No Known Allergies  Medications Prior to Admission  Medication Dose Route Frequency Provider Last Rate Last Dose  . acetaminophen (TYLENOL) tablet 650 mg  650 mg Oral Q4H PRN Lenore Cordia, NP      . allopurinol (ZYLOPRIM) tablet 100 mg  100 mg Oral Daily Lenore Cordia, NP      . amLODipine (NORVASC) tablet 10 mg  10 mg Oral Daily Lenore Cordia, NP      . aspirin suppository 300 mg  300 mg Rectal NOW Lenore Cordia, NP      . benazepril (LOTENSIN) tablet 20 mg  20 mg Oral Daily Lenore Cordia, NP      . bivalirudin (ANGIOMAX) 250 MG injection           . dextrose 5 %-0.9 % sodium chloride infusion   Intravenous Continuous Lenore Cordia, NP      . eptifibatide (INTEGRILIN) 75 mg / 100 mL infusion           . eptifibatide (INTEGRILIN) 75 mg / 100 mL infusion  1 mcg/kg/min Intravenous Continuous Hilario Quarry Amend, PHARMD      . ezetimibe (ZETIA) tablet 10 mg  10 mg Oral Daily Lenore Cordia, NP      . fentaNYL (SUBLIMAZE) 0.05 MG/ML injection           . furosemide (LASIX) tablet 40 mg  40 mg Oral BID Lenore Cordia, NP      . glimepiride (AMARYL) tablet 2 mg  2 mg Oral QAC breakfast Lenore Cordia, NP      . heparin 2-0.9 UNIT/ML-% infusion           . heparin 5000 UNIT/ML injection        4,000 Units at 05/26/11 2142  . heparin ADULT infusion 100 units/mL (25000 units/250 mL)  900  Units/hr Intravenous Continuous Hilario Quarry Amend, PHARMD      . insulin aspart (novoLOG) injection 0-9 Units  0-9 Units Subcutaneous Q4H Lenore Cordia, NP      . levothyroxine (SYNTHROID, LEVOTHROID) tablet 100 mcg  100 mcg Oral Daily Lenore Cordia, NP      . lidocaine (XYLOCAINE) 1 % injection           . LORazepam (ATIVAN) tablet 0.5 mg  0.5 mg Oral Q8H Lenore Cordia, NP      . metoprolol (LOPRESSOR) tablet 50 mg  50 mg Oral BID Lenore Cordia, NP      . nitroGLYCERIN (NITROSTAT) SL tablet 0.4 mg  0.4 mg Sublingual Q5 min PRN Lenore Cordia, NP      . nitroGLYCERIN (NTG ON-CALL) 0.2 mg/mL injection           . nitroGLYCERIN 0.2 mg/mL in dextrose 5 % infusion  3-30 mcg/min Intravenous Titrated Lenore Cordia, NP      .  ondansetron (ZOFRAN) injection 4 mg  4 mg Intravenous Q6H PRN Lenore Cordia, NP      . sodium bicarbonate tablet 650 mg  650 mg Oral Daily Lenore Cordia, NP      . sodium chloride 0.9 % bolus 250 mL  250 mL Intravenous Once Nena Alexander, MD   250 mL at 05/26/11 2142  . zolpidem (AMBIEN) tablet 5 mg  5 mg Oral QHS PRN Lenore Cordia, NP      . DISCONTD: aspirin EC tablet 81 mg  81 mg Oral Daily Lenore Cordia, NP      . DISCONTD: heparin 100-0.45 UNIT/ML-% infusion            No current outpatient prescriptions on file as of 05/26/2011.    No results found for this or any previous visit (from the past 48 hour(s)). No results found.  Review of Systems  Unable to perform ROS   Blood pressure 113/66, pulse 68, resp. rate 20, SpO2 98.00%. Physical Exam  Constitutional: She is oriented to person, place, and time. She appears well-developed and well-nourished. She is cooperative. She appears distressed.  HENT:  Head: Normocephalic and atraumatic.  Eyes: Conjunctivae and EOM are normal. Pupils are equal, round, and reactive to light. No scleral icterus.  Neck: Normal range of motion. Neck supple. No tracheal deviation present. No  thyromegaly present.  Cardiovascular: Normal rate, regular rhythm, S1 normal and S2 normal.  Exam reveals distant heart sounds.   Respiratory: Effort normal. She has rhonchi.  GI: Soft. Bowel sounds are normal. She exhibits no mass. There is hepatosplenomegaly. There is no tenderness.  Musculoskeletal: Normal range of motion.  Neurological: She is alert and oriented to person, place, and time. She has normal strength. No cranial nerve deficit or sensory deficit.  Skin: Skin is warm, dry and intact. No cyanosis. Nails show no clubbing.  Psychiatric: Her speech is normal and behavior is normal. Judgment and thought content normal. Her mood appears anxious. Cognition and memory are normal.     Assessment/Plan 1) STEMI - Inferior MI 2) HTN 3) Dyslipidemia 4) DM 5) Hypothyroidism 6) gout  For emergent cardiac catheterization and likely percutaneous coronary intervention with Dr. [name].   [name],[name] ([name]) 05/26/2011, 10:15 PM  I have seen and examined the patient along with [name],[name], NP.   I have reviewed the chart, notes and new data.  I agree with [name]'s note. I saw the patient in the ER prior to taking her to the catheterization laboratory.    Key new complaints: Inferior STEMI Key new findings / data: SEE Catheterization report.  PLAN: LHC - PTCA of culprit vessel - 100% occluded RCA due to severe LM disease. See Catheterization report for additional plan.  [name] W 05/27/2011, 1:15 AM

## 2011-05-26 NOTE — ED Notes (Signed)
Dr. Herbie Baltimore cardiology at bedside to see pt. Family at bedside.

## 2011-05-26 NOTE — ED Provider Notes (Signed)
History     CSN: [id number] Arrival date & time: 05/26/2011  9:33 PM   None     Chief Complaint  Patient presents with  . Code STEMI    CP started around 2000    (Consider location/radiation/quality/duration/timing/severity/associated sxs/prior treatment) Patient is a 77 y.o. female presenting with chest pain. The history is provided by the patient and the EMS personnel.  Chest Pain The chest pain began 1 - 2 hours ago. Chest pain occurs constantly. The chest pain is improving. At its most intense, the pain is at 8/10. The pain is currently at 1/10. The severity of the pain is mild. The quality of the pain is described as heavy. The pain does not radiate. Pertinent negatives for primary symptoms include no fever, no wheezing, no dizziness and no altered mental status. She tried nitroglycerin for the symptoms. Risk factors include no known risk factors.     Past Medical History  Diagnosis Date  . Diabetes mellitus   . Hypertension     History reviewed. No pertinent past surgical history.  History reviewed. No pertinent family history.  History  Substance Use Topics  . Smoking status: Not on file  . Smokeless tobacco: Not on file  . Alcohol Use:     OB History    Grav Para Term Preterm Abortions TAB SAB Ect Mult Living                  Review of Systems  Constitutional: Negative for fever.  Respiratory: Negative for wheezing.   Cardiovascular: Positive for chest pain.  Neurological: Negative for dizziness.  Psychiatric/Behavioral: Negative for altered mental status.  All other systems reviewed and are negative.    Allergies  Review of patient's allergies indicates no known allergies.  Home Medications  No current outpatient prescriptions on file.  There were no vitals taken for this visit.  Physical Exam  Nursing note and vitals reviewed. Constitutional: She is oriented to person, place, and time. She appears well-developed and well-nourished. She  appears distressed (mild).  HENT:  Head: Normocephalic and atraumatic.  Eyes: Conjunctivae are normal. Pupils are equal, round, and reactive to light.  Neck: Normal range of motion. Neck supple.  Cardiovascular: Normal rate, regular rhythm and normal heart sounds.   Pulmonary/Chest: Effort normal and breath sounds normal. No respiratory distress.  Abdominal: Soft. There is no tenderness.  Musculoskeletal: Normal range of motion. She exhibits no edema and no tenderness.  Neurological: She is alert and oriented to person, place, and time. No cranial nerve deficit.  Skin: Skin is warm and dry.    ED Course  Procedures (including critical care time)  Labs Reviewed - No data to display No results found.   No diagnosis found.    Date: 05/26/2011  Rate:64  Rhythm: normal sinus rhythm  QRS Axis: normal  Intervals: PR prolonged  ST/T Wave abnormalities: ST elevations inferiorly  Conduction Disutrbances:first-degree A-V block   Narrative Interpretation:   Old EKG Reviewed: changes noted   MDM  PT presented due to CP.  STEMI inferior noted on EKG.  Code STEMI called prior to arrival.  Given heparin here.  ASA prior to arrival.  Minimal pain.  Given IVF due to slightly low BP.  Dr. Harding(cardiology) took pt to cath lab.          Nena Alexander, MD 05/26/11 2157

## 2011-05-26 NOTE — ED Provider Notes (Signed)
Level V casveat; urgent need for intervention Complains of anterior chest pain/epigastric pain onset 8 PM tonight pain constant. Anterior, nonradiating .Marland Kitchen Treated by EMS with 3 nitroglycerin sublingually and 4 baby aspirin his pain mild at present . On exam lungs clear to auscultation heart regular rate and rhythm abdomen obese nontender . Coat stemi called in the prehospital setting due to prehospital EKG showing acute inferior wall myocardial infarction  I read the EKG, concordantly with the resident and agree with the reading   Doug Sou, MD 05/26/11 2156

## 2011-05-26 NOTE — Procedures (Signed)
[facility]     CARDIAC CATHETERIZATION / PERCUTANEOUS CORONARY INTERVENTION REPORT  [name]  MRN: [id number] Jun 09, 1921   Date of Admission / Procedure: 05/27/2011  Performing Cardiologist: Marykay Lex MD Primary Physician: Adela Lank, MD Primary Cardiologist:  Marykay Lex  MD  Procedures Performed:  Left Heart Catheterization via 5 Fr RFA access  Native Coronary Angiography  Percutaneous coronary angioplasty to mid RCA for 100% occlusion  Intracoronary NTG injection x 3 ( each)  Indication(s): Inferior MI  Diabetes  Hypertension  Hyperlipidemia  History: 77 y.o. female with PMH above, transferred by EMS to [facility] ER with Inferior STEMI.  Onset of chest discomfort and shortness of breath was ~2000.  Arrival to ED 2132. To Cath Lab 2150.  Balloon - 2222.  Non-system delay was due to the need to consult via telephone with CT Surgeon (Dr. Laneta Simmers) due to ostial LM disease and occluded RCA - determining best COA was PTCA alone.  Consent: The procedure with Risks/Benefits/Alternatives and Indications was reviewed with the patient (and family).  All questions were answered.    Risks / Complications include, but not limited to: Death, MI, CVA/TIA, VF/VT (with defibrillation), Bradycardia (need for temporary pacer placement), contrast induced nephropathy, bleeding / bruising / hematoma / pseudoaneurysm, vascular or coronary injury (with possible emergent CT or Vascular Surgery), adverse medication reactions, infection.    The patient (and family) voice understanding and agree to proceed.    Risks of procedure as well as the alternatives and risks of each were explained to the (patient/caregiver).  Consent for procedure obtained. Consent for signed by MD and patient with RN witness -- placed on chart.  Procedure: The patient was brought to the 2nd Floor Mobile Cardiac Catheterization Lab in the fasting state and prepped and  draped in the usual sterile fashion for Right groin access.  Sterile technique was used including antiseptics, cap, gloves, gown, hand hygiene, mask and sheet.  Skin prep: Chlorhexidine;  Time Out: Verified patient identification, verified procedure, site/side was marked, verified correct patient position, special equipment/implants available, medications/allergies/relevent history reviewed, required imaging and test results available.  Performed   The right femoral head was identified using tactile and fluoroscopic technique.  The right groin was anesthetized with 1% subcutaneous Lidocaine.  The right Common Femoral Artery was accessed using the Modified Seldinger Technique with placement of a antimicrobial bonded/coated single lumen (6 Fr) sheath was placed in the Right Common Femoral Artery using the modified Seldinger technique.  The sheath was aspirated and flushed.    A 6 Fr JL4 Catheter was advanced of over a Standard J wire into the ascending Aorta.  The catheter was used to engage the Left coronary artery.  Only 2 cineangiographic views of the Left coronary artery system(s) were performed due to severe ostial Left Main disease and catheter related pressure damping and worsening inferior ST Elevations indicating poor perfusion with minimal vessel engagement.  A 6 Fr JRG Catheter was advanced of over a Standard J wire into the ascending Aorta.  The catheter was used to engage the Right coronary artery.  Multiple cineangiographic views of the Right coronary artery system were performed demonstrating mid vessel 100% thrombotic occlusion with TIMI 3 flow.  The diagnostic catheter was then exchanged for a 6Fr JR4 Guide Catheter that was used to engage the RCA, and an ACT was checked.    At this time - CT Surgery on call (Dr. Laneta Simmers) returned his page.  I stepped  to the phone to discuss the patient's presentation, history and potential therapeutic options.  Due to the definite need to consider CABG,  the decision was made to perform PTCA alone (POBA) of the RCA with the plan to avoid necessity of stent placement & oral antiplatelet therapy.  This ~5 minute phone call was an unavoidable non-system delay of the door to balloon time.  Integrelin bolus (single bolus) followed by renal dose infusion was administered.  PERCUTANEOUS CORONARY INTERVENTION PROCEDURE  After reviewing the initial cineangiography images, the culprit lesion(s) was identified, and the decision was made to proceed with percutaneous coronary intervention.  An ACT of > 200 Sec was confirmed prior to advancing the Guidewire.  Lesion #1:  Mid RCA (following a tortuous proximal vessel)  Pre-PCI Stenosis: 100 %, thrombotic Post-PCI Stenosis: 10-20%     TIMI 0 flow       TIMI 3 flow  Guide Catheter: 66F JR4    Guidewire: BMW  Pre-Dilitation Balloon: 2.0 mm x 12 mm Emerge -- Balloon time 2222 hrs.   1st Inflation:    8 Atm for 30 Sec; restored TIMI 3 flow with minimal distal embolization into RPDA; IC NTG injection   2nd Inflation:  12 Atm for 30 Sec Scout angiography did not reveal evidence of dissection or perforation  Final PTCA Balloon: 2.75 mm x 15 mm Switzer Quantum    1st Inflation:  (Distal portion of lesion) 12 Atm for 60 Sec - final diameter 2.8 mm -- IC NTG injection   2nd Inflation: (Proximal portion of lesion) 12 Atm for 90 Sec - final diameter 2.8 mm  Scout angiography did not reveal evidence of dissection or perforation,  There was residual ~10-20% stenosis following intra-coronary NTG injection. Due to the concern for PTCA related dissection, I decided to terminate the intervention at this point as there was TIMI 3 flow and no evidence of significant lesion recoil after 5 min.  The distal embolic occlusion was nearly resolved.  ECG ST Elevations were resolved and she was 100% pain free and hemodynamically stable.  This catheter was then exchanged over the Standard J wire for an angled Pigtail catheter that was  advanced across the Aortic Valve.  LV hemodynamics were measured, but due to an iSTAT lab indicating Creatinine of ~2.0 and an EDP of , Left Ventriculography was not performed.  Also, with LM stenosis, I decided to avoid the risk of Ventriculogram related VT.  The catheter was pulled back across the Aortic Valve for measurement of "pull-back" gradient.  The catheter and the wire were removed completely out of the body.  The patient was transported to the CCU  in stable condition chest pain 0/10.   The patient  was stable before, during and following the procedure.   Patient did tolerate procedure well. There were not complications.  EBL: <23ml  Medications:  Sedation:  50 mcg IV Fentanyl  Contrast:  200 ml Omnipaque  IV Heparin infusion (4000 Units administered in ER) - ACT >200sec  Integrelin single bolus with renal dose rate infusion  Hemodynamics:  Central Aortic Pressure / Mean Aortic Pressure: 116/56 mmHg,  LV Pressure / LV End diastolic Pressure:   116/18 mmHg, 25 mmHg  Left Ventriculography:  EF:  Not done  Wall Motion: not done  Coronary Angiographic Data:  Left Main:  80 - 90 % calcified ostial LM  Left Anterior Descending (LAD):  Proximal vessel not well visualized. Due to limited angiographic images (see above); there is a  tubular ~80-90% stenosis in the Mid LAD with the remainder of the vessel tapering toward the apex.  1st diagonal (D1):  Proximal vessel, no angiographic evidence of stenosis  Circumflex (LCx):  Large, No angiographically significant CAD  1st obtuse marginal:  No angiographically significant CAD  2nd obtuse marginal:  Large to moderate, No angiographically significant CAD 3rd obtuse marginal:  Moderate, No angiographically significant CAD  posterior lateral branch:  Not well visualized, but appears, No angiographically significant CAD  Right Coronary Artery: Proximal vessel is a large (~4-4.5 mm) vessel that is extremely tortuous with a  very proximal conus branch.  There is ~10-20% ostial stenosis, the vessel tapers down to a ~3.0 or smaller vessel prior to the 100% occlusion in the mid portion just after a small RV marginal branch.    right ventricle branch of right coronary artery: Small vessel, No angiographically significant CAD POST PTCA -  Residual ~10-20% in mid RCA after PTCA with 2.1mm [place] balloon; TIMI 3 Flow  posterior descending artery: small to moderate vessel - diffuse non-flow limiting disease with a distal embolic filling defect that was nearly resolved at the completion of the case  posterior lateral branch: small to moderate vessel - diffuse non-flow limiting disease   Impression: 1.  Culprit lesion is a 100% thrombotic occlusion of mid RCA, s/p successful Balloon Angioplasty wth residual ~10-20% stenosis, TIMI 3 flow with minimal distal emobolization into distal RPDA. 2.  Severe (80-90%) ostial LM stenosis that appears to be calcified with ~80-90% mid LAD lesion, but otherwise appears to have good distal targets.  3.  Elevated LVEDP with. 4.  Best overall option is CT Surgery.   5.  Hemodynamically stable with very tortuous iliac artery system - sub-optimal for IABP.    Plan: 1.  CT Surgery Consulted for MV CABG.  Other, less appealing options are med Rx only or High Risk PCI of LM preceded by stenting of existing RCA lesion to endure adequate perfusion. 2.  Admit to CCU. 3.  Keep RFA line in place overnight for BP monitoring and to allow continuation on IV Heparin and Integrelin infusion as no Oral Antiplatelet given due to potential CABG. 4.  Statin, BB. 5.  2D Echo in AM.  The case and results was discussed with the patient (and family). The case and results was not discussed with the patient's PCP as the procedure was performed late at night.  Should be notified by [facility] service in AM. The case and results was discussed with the patient's Cardiologist.  Time Spend Directly with Patient:  ~60  minutes  HARDING,DAVID W 05/26/2011, 11:47 PM

## 2011-05-26 NOTE — Progress Notes (Signed)
ANTICOAGULATION CONSULT NOTE - Initial Consult  Pharmacy Consult for Heparin/Integrilin Indication: STEMI/s/p balloon angioplasty; CVTS consult for CAD  No Known Allergies  Patient Measurements: Height: 5\' 2"  (157.5 cm) Weight: 209 lb 7 oz (95 kg) IBW/kg (Calculated) : 50.1  Heparin Dosing Weight: 73  Vital Signs: BP: 113/66 mmHg (11/15 2145) Pulse Rate: 68  (11/15 2145)  Labs: No results found for this basename: HGB:2,HCT:3,PLT:3,APTT:3,LABPROT:3,INR:3,HEPARINUNFRC:3,CREATININE:3,CKTOTAL:3,CKMB:3,TROPONINI:3 in the last 72 hours Estimated Creatinine Clearance: 21.9 ml/min (by C-G formula based on Cr of 1.95).  Medical History: Past Medical History  Diagnosis Date  . Diabetes mellitus   . Hypertension     Medications:  Prescriptions prior to admission  Medication Sig Dispense Refill  . allopurinol (ZYLOPRIM) 100 MG tablet Take 100 mg by mouth daily.        Marland Kitchen amLODipine (NORVASC) 10 MG tablet Take 10 mg by mouth daily.        Marland Kitchen aspirin 81 MG tablet Take 81 mg by mouth daily.        . benazepril (LOTENSIN) 20 MG tablet Take 20 mg by mouth daily.        . colesevelam (WELCHOL) 625 MG tablet Take 3,750 mg by mouth daily.        Marland Kitchen ezetimibe (ZETIA) 10 MG tablet Take 10 mg by mouth daily.        . furosemide (LASIX) 40 MG tablet Take 40 mg by mouth 2 (two) times daily.        Marland Kitchen glimepiride (AMARYL) 2 MG tablet Take 2 mg by mouth daily before breakfast.        . levothyroxine (SYNTHROID, LEVOTHROID) 100 MCG tablet Take 100 mcg by mouth daily.        Marland Kitchen LORazepam (ATIVAN) 0.5 MG tablet Take 0.5 mg by mouth every 8 (eight) hours.        . metoprolol (LOPRESSOR) 50 MG tablet Take 50 mg by mouth 2 (two) times daily.        Marland Kitchen olmesartan (BENICAR) 40 MG tablet Take 40 mg by mouth daily.        . sodium bicarbonate 650 MG tablet Take 650 mg by mouth daily.          Assessment: 77 y.o. female presents with STEMI. Taken to cath lab s/p balloon angioplasty of right mid artery. Also with  left main blockage. Plan for CVTS consult and may need CABG. Received integrilin bolus in cath lab and integrilin running at renal dose of 1 mcg/kg/min. Also to start heparin - pt still with sheath (to be pulled tomorrow) - per MD ok to start heparin with sheath in. Goal of Therapy:  Heparin level 0.3-0.5   Plan:  1. Continue integrilin at 37mcg/kg/min 2. Start heparin gtt at 900 units/hr 3. Heparin level and CBC 8 hr post heparin start 4. Daily heparin level and CBC beginning 11/17.  Lavonia Dana 05/26/2011,11:16 PM

## 2011-05-26 NOTE — H&P (Signed)
  Brief H&P  77 y/o AAF with HTN, HLD, DM presented via EMS with Inferior STEMI - pain 2/10 after SL NTG in EMS.  Pain at EMS arrival 90 from onset. Arrival to Cath Lab - 2156.  Risks of procedure as well as the alternatives and risks of each were explained to the (patient/caregiver).  Consent for procedure obtained. Verbal consent was given by the patient with her son & husband present.   [name] W 05/26/2011 9:59 PM

## 2011-05-27 ENCOUNTER — Encounter (HOSPITAL_COMMUNITY): Payer: Self-pay | Admitting: Certified Registered Nurse Anesthetist

## 2011-05-27 ENCOUNTER — Other Ambulatory Visit: Payer: Self-pay

## 2011-05-27 ENCOUNTER — Inpatient Hospital Stay (HOSPITAL_COMMUNITY): Payer: Medicare Other

## 2011-05-27 DIAGNOSIS — I2510 Atherosclerotic heart disease of native coronary artery without angina pectoris: Secondary | ICD-10-CM

## 2011-05-27 DIAGNOSIS — E039 Hypothyroidism, unspecified: Secondary | ICD-10-CM | POA: Diagnosis present

## 2011-05-27 LAB — POCT ACTIVATED CLOTTING TIME: Activated Clotting Time: 149 seconds

## 2011-05-27 LAB — DIFFERENTIAL
Basophils Relative: 0 % (ref 0–1)
Eosinophils Absolute: 0 10*3/uL (ref 0.0–0.7)
Eosinophils Relative: 0 % (ref 0–5)
Lymphs Abs: 0.6 10*3/uL — ABNORMAL LOW (ref 0.7–4.0)
Monocytes Absolute: 0.3 10*3/uL (ref 0.1–1.0)
Monocytes Relative: 3 % (ref 3–12)
Neutrophils Relative %: 90 % — ABNORMAL HIGH (ref 43–77)

## 2011-05-27 LAB — LIPID PANEL
HDL: 66 mg/dL (ref >39)
LDL Cholesterol: 108 mg/dL — ABNORMAL HIGH (ref 0–99)
Total CHOL/HDL Ratio: 2.9 RATIO

## 2011-05-27 LAB — CBC
HCT: 39.4 % (ref 36.0–46.0)
HCT: 40 % (ref 36.0–46.0)
Hemoglobin: 12.5 g/dL (ref 12.0–15.0)
Hemoglobin: 12.8 g/dL (ref 12.0–15.0)
MCH: 26.7 pg (ref 26.0–34.0)
MCH: 26.9 pg (ref 26.0–34.0)
MCHC: 31.7 g/dL (ref 30.0–36.0)
MCHC: 32 g/dL (ref 30.0–36.0)
MCV: 84.2 fL (ref 78.0–100.0)
MCV: 84 fL (ref 78.0–100.0)
RBC: 4.68 MIL/uL (ref 3.87–5.11)
RBC: 4.76 MIL/uL (ref 3.87–5.11)

## 2011-05-27 LAB — POCT I-STAT 3, ART BLOOD GAS (G3+)
Acid-base deficit: 1 mmol/L (ref 0.0–2.0)
Bicarbonate: 25.5 mEq/L — ABNORMAL HIGH (ref 20.0–24.0)
O2 Saturation: 95 %
pO2, Arterial: 85 mmHg (ref 80.0–100.0)

## 2011-05-27 LAB — CARDIAC PANEL(CRET KIN+CKTOT+MB+TROPI)
Relative Index: 6.7 — ABNORMAL HIGH (ref 0.0–2.5)
Total CK: 123 U/L (ref 7–177)
Total CK: 151 U/L (ref 7–177)

## 2011-05-27 LAB — POCT I-STAT, CHEM 8
Calcium, Ion: 1.23 mmol/L (ref 1.12–1.32)
Glucose, Bld: 147 mg/dL — ABNORMAL HIGH (ref 70–99)
HCT: 37 % (ref 36.0–46.0)
Hemoglobin: 12.6 g/dL (ref 12.0–15.0)
TCO2: 23 mmol/L (ref 0–100)

## 2011-05-27 LAB — COMPREHENSIVE METABOLIC PANEL
Alkaline Phosphatase: 107 U/L (ref 39–117)
BUN: 40 mg/dL — ABNORMAL HIGH (ref 6–23)
Creatinine, Ser: 1.62 mg/dL — ABNORMAL HIGH (ref 0.50–1.10)
GFR calc Af Amer: 32 mL/min — ABNORMAL LOW (ref >90)
Glucose, Bld: 146 mg/dL — ABNORMAL HIGH (ref 70–99)
Potassium: 4.4 mEq/L (ref 3.5–5.1)
Total Protein: 6 g/dL (ref 6.0–8.3)

## 2011-05-27 LAB — MAGNESIUM: Magnesium: 2.1 mg/dL (ref 1.5–2.5)

## 2011-05-27 LAB — GLUCOSE, CAPILLARY
Glucose-Capillary: 134 mg/dL — ABNORMAL HIGH (ref 70–99)
Glucose-Capillary: 129 mg/dL — ABNORMAL HIGH (ref 70–99)
Glucose-Capillary: 153 mg/dL — ABNORMAL HIGH (ref 70–99)

## 2011-05-27 LAB — TSH: TSH: 2.679 u[IU]/mL (ref 0.350–4.500)

## 2011-05-27 LAB — HEMOGLOBIN A1C
Hgb A1c MFr Bld: 5.9 % — ABNORMAL HIGH (ref <5.7)
Mean Plasma Glucose: 123 mg/dL — ABNORMAL HIGH (ref <117)

## 2011-05-27 LAB — MRSA PCR SCREENING: MRSA by PCR: NEGATIVE

## 2011-05-27 MED ORDER — BIOTENE DRY MOUTH MT LIQD
15.0000 mL | Freq: Two times a day (BID) | OROMUCOSAL | Status: DC
  Administered 2011-05-27 – 2011-06-01 (×11): 15 mL via OROMUCOSAL

## 2011-05-27 MED ORDER — ENOXAPARIN SODIUM 30 MG/0.3ML ~~LOC~~ SOLN: 30.0000 mg | SUBCUTANEOUS | Status: DC

## 2011-05-27 MED ORDER — HEPARIN (PORCINE) IN NACL 100-0.45 UNIT/ML-% IJ SOLN
1000.0000 [IU]/h | INTRAMUSCULAR | Status: DC
  Administered 2011-05-28: 1000 [IU]/h via INTRAVENOUS
  Filled 2011-05-27: qty 250

## 2011-05-27 MED ORDER — ATROPINE SULFATE 1 MG/ML IJ SOLN
INTRAMUSCULAR | Status: AC
  Filled 2011-05-27: qty 1

## 2011-05-27 MED ORDER — OXYCODONE-ACETAMINOPHEN 5-325 MG PO TABS
1.0000 | ORAL_TABLET | ORAL | Status: DC | PRN
Start: 2011-05-27 — End: 2011-06-01
  Filled 2011-05-27: qty 1

## 2011-05-27 MED ORDER — HEPARIN (PORCINE) IN NACL 100-0.45 UNIT/ML-% IJ SOLN
900.0000 [IU]/h | INTRAMUSCULAR | Status: DC
  Administered 2011-05-27: 900 [IU]/h via INTRAVENOUS
  Filled 2011-05-27: qty 250

## 2011-05-27 MED ORDER — DIAZEPAM 2 MG PO TABS: 2.0000 mg | ORAL_TABLET | ORAL | Status: DC | PRN

## 2011-05-27 MED ORDER — PNEUMOCOCCAL VAC POLYVALENT 25 MCG/0.5ML IJ INJ
0.5000 mL | INJECTION | INTRAMUSCULAR | Status: AC
  Administered 2011-05-28: 0.5 mL via INTRAMUSCULAR
  Filled 2011-05-27: qty 0.5

## 2011-05-27 MED ORDER — ASPIRIN 325 MG PO TABS
325.0000 mg | ORAL_TABLET | Freq: Every day | ORAL | Status: DC
  Filled 2011-05-27 (×2): qty 1

## 2011-05-27 MED ORDER — MORPHINE SULFATE 2 MG/ML IJ SOLN: 1.0000 mg | INTRAMUSCULAR | Status: DC | PRN

## 2011-05-27 MED ORDER — WHITE PETROLATUM GEL
Status: AC
  Administered 2011-05-27: 15:00:00
  Filled 2011-05-27: qty 5

## 2011-05-27 MED ORDER — EPTIFIBATIDE 75 MG/100ML IV SOLN
1.0000 ug/kg/min | INTRAVENOUS | Status: DC
  Administered 2011-05-27: 1 ug/kg/min via INTRAVENOUS
  Filled 2011-05-27: qty 100

## 2011-05-27 MED ORDER — PANTOPRAZOLE SODIUM 40 MG PO TBEC
40.0000 mg | DELAYED_RELEASE_TABLET | Freq: Every day | ORAL | Status: DC
  Administered 2011-05-27 – 2011-06-01 (×6): 40 mg via ORAL
  Filled 2011-05-27 (×6): qty 1

## 2011-05-27 MED ORDER — ASPIRIN EC 325 MG PO TBEC
325.0000 mg | DELAYED_RELEASE_TABLET | Freq: Every day | ORAL | Status: DC
  Administered 2011-05-27 – 2011-06-01 (×5): 325 mg via ORAL
  Filled 2011-05-27 (×6): qty 1

## 2011-05-27 MED ORDER — ROSUVASTATIN CALCIUM 20 MG PO TABS
20.0000 mg | ORAL_TABLET | Freq: Every day | ORAL | Status: DC
  Administered 2011-05-27 – 2011-05-30 (×4): 20 mg via ORAL
  Filled 2011-05-27 (×5): qty 1

## 2011-05-27 NOTE — Consult Note (Signed)
CARDIOTHORACIC SURGERY CONSULTATION REPORT  PCP is Adela Lank, MD Attending physician is Bryan Lemma, MD Referring Provider is Bryan Lemma, MD   Reason for consultation:  Left main disease, s/p acute inferior MI  HPI:  Patient is an 77 year old morbidly obese African American female from [place] with no previous history of coronary artery disease but risk factors notable for history of hypertension, type 2 diabetes mellitus, and hyperlipidemia. The patient has a long history of exertional shortness of breath that has reportedly gotten progressively worse over several years. The patient has very limited physical mobility do to arthritis involving her hips and knees. She reports that functionally she has not been able to do much of anything for quite some time. Yesterday evening she developed sudden onset of substernal chest tightness that was associated with shortness of breath and persisted, prompting her to present to the emergency room where she was diagnosed with acute of all for an inferior wall myocardial infarction. Cardiac catheterization was performed by Dr. Herbie Baltimore and notable for ostial left main stenosis and 100% occlusion of the mid right coronary artery. Telephone consultation with cardiothoracic surgery was performed and a decision was made to proceed with primary balloon angioplasty of the culprit right coronary artery. This was performed successfully and the patient has remained clinically stable since then. The patient has had no further chest pain since the intervention. Cardiothoracic surgical consultation has been requested to consider coronary artery bypass grafting.  Past Medical History  Diagnosis Date  . Diabetes mellitus   . Hypertension   . PONV (postoperative nausea and vomiting)   . Angina   . Shortness of breath   . Chronic kidney disease   . Hypothyroidism   . Headache   . Arthritis   . Anxiety   . Myocardial infarction   . Pneumonia      Past Surgical History  Procedure Date  . Joint replacement     both hips replaced  . Cardiac catheterization     Family History  Problem Relation Age of Onset  . Breast cancer Mother   . Heart disease Father     Social History History  Substance Use Topics  . Smoking status: Never Smoker   . Smokeless tobacco: Not on file  . Alcohol Use: 0.6 oz/week    1 Glasses of wine per week    Current Facility-Administered Medications  Medication Dose Route Frequency Provider Last Rate Last Dose  . acetaminophen (TYLENOL) tablet 650 mg  650 mg Oral Q4H PRN Lenore Cordia, NP      . allopurinol (ZYLOPRIM) tablet 100 mg  100 mg Oral Daily Lenore Cordia, NP   100 mg at 05/27/11 0907  . amLODipine (NORVASC) tablet 10 mg  10 mg Oral Daily Lenore Cordia, NP   10 mg at 05/27/11 0907  . antiseptic oral rinse (BIOTENE) solution 15 mL  15 mL Mouth Rinse BID Marykay Lex   15 mL at 05/27/11 0800  . aspirin EC tablet 325 mg  325 mg Oral Daily Lenore Cordia, NP   325 mg at 05/27/11 1610  . aspirin suppository 300 mg  300 mg Rectal NOW Lenore Cordia, NP      . aspirin tablet 325 mg  325 mg Oral Daily Lenore Cordia, NP      . atropine 1 MG/ML injection           . benazepril (LOTENSIN) tablet 20 mg  20 mg Oral Daily Lenore Cordia, NP      . bivalirudin (ANGIOMAX) 250 MG injection           . diazepam (VALIUM) tablet 2 mg  2 mg Oral Q4H PRN Lenore Cordia, NP      . eptifibatide (INTEGRILIN) 75 mg / 100 mL infusion  1 mcg/kg/min Intravenous Continuous Hilario Quarry Amend, PHARMD 7.6 mL/hr at 05/27/11 0640 1 mcg/kg/min at 05/27/11 0640  . ezetimibe (ZETIA) tablet 10 mg  10 mg Oral Daily Lenore Cordia, NP      . fentaNYL (SUBLIMAZE) 0.05 MG/ML injection           . furosemide (LASIX) tablet 40 mg  40 mg Oral BID Lenore Cordia, NP   40 mg at 05/27/11 0800  . glimepiride (AMARYL) tablet 2 mg  2 mg Oral QAC breakfast Lenore Cordia, NP      . heparin 2-0.9  UNIT/ML-% infusion           . heparin 5000 UNIT/ML injection        4,000 Units at 05/26/11 2142  . heparin ADULT infusion 100 units/mL (25000 units/250 mL)  900 Units/hr Intravenous Continuous Anh P Pham, PHARMD 9 mL/hr at 05/27/11 1200 900 Units/hr at 05/27/11 1200  . insulin aspart (novoLOG) injection 0-9 Units  0-9 Units Subcutaneous Q4H Lenore Cordia, NP   1 Units at 05/27/11 1200  . levothyroxine (SYNTHROID, LEVOTHROID) tablet 100 mcg  100 mcg Oral Daily Lenore Cordia, NP   100 mcg at 05/27/11 0908  . lidocaine (XYLOCAINE) 1 % injection           . LORazepam (ATIVAN) tablet 0.5 mg  0.5 mg Oral Q8H Lenore Cordia, NP   0.5 mg at 05/27/11 1437  . metoprolol (LOPRESSOR) tablet 50 mg  50 mg Oral BID Lenore Cordia, NP   50 mg at 05/27/11 1610  . morphine 2 MG/ML injection 1 mg  1 mg Intravenous Q1H PRN Lenore Cordia, NP      . nitroGLYCERIN (NITROSTAT) SL tablet 0.4 mg  0.4 mg Sublingual Q5 min PRN Lenore Cordia, NP      . nitroGLYCERIN (NTG ON-CALL) 0.2 mg/mL injection           . nitroGLYCERIN 0.2 mg/mL in dextrose 5 % infusion  3-30 mcg/min Intravenous Titrated Lenore Cordia, NP      . ondansetron [facility]) injection 4 mg  4 mg Intravenous Q6H PRN Lenore Cordia, NP   4 mg at 05/27/11 0005  . oxyCODONE-acetaminophen (PERCOCET) 5-325 MG per tablet 1-2 tablet  1-2 tablet Oral Q4H PRN Lenore Cordia, NP      . pantoprazole (PROTONIX) EC tablet 40 mg  40 mg Oral Q1200 Lenore Cordia, NP   40 mg at 05/27/11 1200  . pneumococcal 23 valent vaccine (PNU-IMMUNE) injection 0.5 mL  0.5 mL Intramuscular Tomorrow-1000 Marykay Lex      . rosuvastatin (CRESTOR) tablet 20 mg  20 mg Oral q1800 Lenore Cordia, NP      . sodium bicarbonate tablet 650 mg  650 mg Oral Daily Lenore Cordia, NP   650 mg at 05/27/11 0908  . sodium chloride 0.9 % bolus 250 mL  250 mL Intravenous Once Nena Alexander, MD   250 mL at 05/26/11 2142  . white petrolatum (VASELINE) gel            . zolpidem (AMBIEN) tablet 5  mg  5 mg Oral QHS PRN Lenore Cordia, NP      . DISCONTD: aspirin EC tablet 81 mg  81 mg Oral Daily Lenore Cordia, NP      . DISCONTD: dextrose 5 %-0.9 % sodium chloride infusion   Intravenous Continuous Lenore Cordia, NP 10 mL/hr at 05/27/11 1700    . DISCONTD: enoxaparin (LOVENOX) injection 30 mg  30 mg Subcutaneous Q24H Lenore Cordia, NP      . DISCONTD: heparin 100-0.45 UNIT/ML-% infusion           . DISCONTD: heparin ADULT infusion 100 units/mL (25000 units/250 mL)  900 Units/hr Intravenous Continuous Hilario Quarry Amend, PHARMD 9 mL/hr at 05/27/11 0600 9 mL/hr at 05/27/11 0600    No Known Allergies  Review of Systems:  General:  normal appetite but moderate progressive generalized weakness  Respiratory:  no cough, no wheezing, no hemoptysis, no pain with inspiration or cough, episode pneumonia last year  Cardiac:  As per HPI, the patient has fairly severe exertional SOB which has been slowly progressive for several years, no resting SOB, no PND, no orthopnea, she has chronic LE edema, no palpitations, no syncope  GI:   no difficulty swallowing, no hematochezia, no hematemesis, no melena, no constipation, no diarrhea  GU:   no dysuria, no urgency, no frequency, one severe UTI last year  Musculoskeletal: Significant arthritis both knees, both hips (s/p bilateral THR), and hands. Very limited mobility with walker or cane  Vascular:  no pain suggestive of claudication   Neuro:   no symptoms suggestive of TIA's, no seizures, no headaches, + peripheral neuropathy both feet  Endocrine:  Diabetes reportedly well controlled  HEENT:  no loose teeth or painful teeth,  no recent vision changes  Psych:   no anxiety, no depression    Physical Exam:   BP 138/51  Pulse 73  Temp(Src) 98.2 F (36.8 C) (Oral)  Resp 17  Ht 5\' 2"  (1.575 m)  Wt 99.9 kg (220 lb 3.8 oz)  BMI 40.28 kg/m2  SpO2 99%  General:  Obese, elderly, NAD   HEENT:  Unremarkable    Neck:   no JVD, no bruits, no adenopathy   Chest:   clear to auscultation, symmetrical breath sounds, no wheezes, no rhonchi   CV:   RRR, no murmur   Abdomen:  soft, non-tender, no masses   Extremities:  warm, adequately-perfused, pulses non-palpable, chronic venous insufficiency and chronic skin changes  Rectal/GU  Deferred  Neuro:   Grossly non-focal and symmetrical throughout  Skin:   Clean and dry, no rashes, no breakdown  Diagnostic Tests:  Cardiac catheterization performed yesterday by Dr. Herbie Baltimore is reviewed. There is 100% occlusion of the mid right coronary artery. There were 2 brief images of the left coronary circulation. There is ostial stenosis of left main coronary artery with a somewhat angulated take off of the left main. The severity of stenosis is difficult to quantify but probably is significant. The remainder of the left coronary circulation is not well visualized. There does appear to be high-grade stenosis of the distal left anterior descending coronary artery. There is significant calcification in the descending aorta and significant calcification in the coronary arteries themselves. Primary balloon angioplasty of the mid right coronary artery was performed with good result. There is right dominant coronary circulation with normal antegrade flow after completion of the procedure.  Transthoracic echocardiogram is reviewed. This demonstrates essentially normal left ventricular size and function. There is no  significant mitral regurgitation.  Impression:  Ostial left main disease with three-vessel coronary artery disease status post acute inferior wall myocardial infarction treated with primary balloon angioplasty of the culprit right coronary artery. The patient has been clinically stable since the procedure with no further chest pain. The patient has chronic exertional shortness of breath which is fairly significant despite normal left ventricular systolic function. This  could be anginal equivalent and related to the patient's left main disease. The patient also has very limited physical mobility do to generalized weakness, exertional shortness of breath, obesity, and significant chronic arthritis afflicting her hips and knees.  There is no question that long-term survival would be best if she was treated for surgical revascularization of her coronary artery disease. However, the risks of surgery will be fairly high and associated with potential for very slow physical recovery as well as the possibility that her functional status may never make it back to her current baseline. Options include medical therapy versus PCI with stenting of the right coronary artery to decrease the risk of recurrent ischemia or infarction do to the right coronary artery stenosis versus high-risk PCI and stenting of the left main coronary artery. Based upon the limited views from the catheterization, it is difficult to tell exactly how tight left main stenosis is, although it is undoubtedly significant.  Plan:  I've discussed matters at length with the patient and her entire family in her hospital room this evening. They wish to talk matters over further before the make final decision. I have made it clear that if she is ever going to consider coronary artery bypass surgery she should have it in the near future to give her the best chance for recovery and prolonged life. If her decision is that she does not wish to go through high-risk coronary artery bypass surgery it would not be reasonable to consider surgical intervention a later time should her condition deteriorate further. She is clearly not a good candidate for surgery, but I would not rule out surgery if the patient understands the risks and wants to proceed. All of their questions have been addressed.   [name] Decent. Cornelius Moras, MD

## 2011-05-27 NOTE — Progress Notes (Signed)
ANTICOAGULATION CONSULT NOTE - Follow Up Consult  Pharmacy Consult for heparin and and integrilin  Indication: CAD  No Known Allergies  Patient Measurements: Height: 5\' 2"  (157.5 cm) Weight: 220 lb 3.8 oz (99.9 kg) IBW/kg (Calculated) : 50.1  Adjusted Body Weight:  Vital Signs: Temp: 98.2 F (36.8 C) (11/16 0757) Temp src: Axillary (11/16 0757) BP: 134/48 mmHg (11/16 0800) Pulse Rate: 91  (11/16 0800)  Labs:  Basename 05/27/11 0842 05/27/11 0528 05/27/11 0240 05/27/11 0231  HGB 12.8 -- -- 12.5  HCT 40.0 -- -- 39.4  PLT 195 -- -- 202  APTT -- -- -- --  LABPROT -- -- -- --  INR -- -- -- --  HEPARINUNFRC 0.27* -- -- --  CREATININE -- -- -- 1.62*  CKTOTAL -- 151 123 --  CKMB -- 10.1* 8.7* --  TROPONINI -- 1.31* 1.04* --   Estimated Creatinine Clearance: 27 ml/min (by C-G formula based on Cr of 1.62).   Assessment:  Patient is an 77 y.o. F s/p cath on 11/15 with noted 3 vessels dz with plan to consult CVTS for possible CABG.  Heparin and Integrilin started last night with heparin level of  0.27 this AM but RN started that level was drawn 10 minutes after heparin gtt was d/ced for sheath removal.  To resume heparin 2 hours after sheath removed per MD request.    Goal of Therapy:  Heparin level 0.3-0.5 (while on Integrilin)   Plan:  1) Resume heparin at 900 units/hr two hours after sheath removal. 2) recheck 8 hour heparin level 3) no change for Integrilin   [name] P 05/27/2011,9:29 AM

## 2011-05-27 NOTE — Plan of Care (Signed)
Problem: Phase I Progression Outcomes Goal: Anginal pain relieved Denies pain Goal: Vascular site scale level 0 - I Vascular Site Scale Level 0: No bruising/bleeding/hematoma Level I (Mild): Bruising/Ecchymosis, minimal bleeding/ooozing, palpable hematoma < 3 cm Level II (Moderate): Bleeding not affecting hemodynamic parameters, pseudoaneurysm, palpable hematoma > 3 cm  R groin level 0

## 2011-05-27 NOTE — Progress Notes (Signed)
Arterial sheath on admission had no wave form present. Attempted to draw back blood and unsuccessful. Notified Dr. Herbie Baltimore. Dr. Herbie Baltimore at bedside to assess problem. Pt went bradycardic on monitor for brief period of time while nauseous and coughing. MD gave orders for zofran 4mg  IV. Will continue to monitor. Natividad Brood 05/27/2011

## 2011-05-27 NOTE — Progress Notes (Signed)
ANTICOAGULATION CONSULT NOTE - Follow Up Consult  Pharmacy Consult for heparin and and integrilin  Indication: STEMI  No Known Allergies  Patient Measurements: Height: 5\' 2"  (157.5 cm) Weight: 220 lb 3.8 oz (99.9 kg) IBW/kg (Calculated) : 50.1  Heparin dosing wt= 74kg  Vital Signs: Temp: 98 F (36.7 C) (11/16 2000) Temp src: Oral (11/16 2000) BP: 138/51 mmHg (11/16 1700) Pulse Rate: 73  (11/16 1700)  Labs:  Basename 05/27/11 1947 05/27/11 0842 05/27/11 0528 05/27/11 0240 05/27/11 0231 05/26/11 2235  HGB -- 12.8 -- -- 12.5 --  HCT -- 40.0 -- -- 39.4 37.0  PLT -- 195 -- -- 202 --  APTT -- -- -- -- -- --  LABPROT -- -- -- -- -- --  INR -- -- -- -- -- --  HEPARINUNFRC 0.21* 0.27* -- -- -- --  CREATININE -- -- -- -- 1.62* 2.00*  CKTOTAL -- -- 151 123 -- --  CKMB -- -- 10.1* 8.7* -- --  TROPONINI -- -- 1.31* 1.04* -- --   Estimated Creatinine Clearance: 27 ml/min (by C-G formula based on Cr of 1.62).   Assessment:   Patient is an 77 y.o. F s/p cath on 11/15 with noted 3 vessels dz with plan to consult CVTS for possible CABG.  Patient on heparin and integrilin (37mcg/kg/min) and current heparin level is below goal (heparin at 900 units/hr)  Goal of Therapy:  Heparin level 0.3-0.5 (while on Integrilin)   Plan:  Will increase heparin to 1050 units/hr and check a level 11/17 at 0500 (with morning labs).    Benny Lennert 05/27/2011,8:30 PM

## 2011-05-27 NOTE — Progress Notes (Addendum)
Subjective:  No Complaints  Objective:  Vital Signs in the last 24 hours: Temp:  [97.4 F (36.3 C)-97.8 F (36.6 C)] 97.8 F (36.6 C) (11/16 0400) Pulse Rate:  [68-87] 87  (11/16 0600) Resp:  [15-22] 15  (11/16 0600) BP: (105-157)/(53-87) 138/53 mmHg (11/16 0600) SpO2:  [96 %-98 %] 97 % (11/16 0600) Arterial Line BP: (116-139)/(53-63) 139/58 mmHg (11/16 0600) Weight:  [95 kg (209 lb 7 oz)-99.9 kg (220 lb 3.8 oz)] 220 lb 3.8 oz (99.9 kg) (11/16 0500)  Intake/Output from previous day: 11/15 0701 - 11/16 0700 In: 664.2 [I.V.:664.2] Out: 1025 [Urine:1025] Intake/Output from this shift: Total I/O In: 664.2 [I.V.:664.2] Out: 1025 [Urine:1025]  Physical Exam: Constitutional: She is oriented to person, place, and time. She appears well-developed and well-nourished. She is cooperative. She appears distressed.  HENT:  Head: Normocephalic and atraumatic.  Eyes: Conjunctivae and EOM are normal. Pupils are equal, round, and reactive to light. No scleral icterus.  Neck: Normal range of motion. Neck supple. No tracheal deviation present. No thyromegaly present.  Cardiovascular: Normal rate, regular rhythm, S1 normal and S2 normal. Exam reveals distant heart sounds.  Respiratory: Effort normal. She has rhonchi.  GI: Soft. Bowel sounds are normal. She exhibits no mass. There is hepatosplenomegaly. There is no tenderness.  Musculoskeletal: Normal range of motion.  Neurological: She is alert and oriented to person, place, and time. She has normal strength. No cranial nerve deficit or sensory deficit.  Skin: Skin is warm, dry and intact. No cyanosis. Nails show no clubbing.  Psychiatric: Her speech is normal and behavior is normal. Judgment and thought content normal. Her mood appears anxious. Cognition and memory are normal.   Lab Results:  Basename 05/27/11 0231  WBC 8.9  HGB 12.5  PLT 202    Basename 05/27/11 0231  NA 142  K 4.4  CL 108  CO2 24  GLUCOSE 146*  BUN 40*  CREATININE  1.62*    Basename 05/27/11 0240  TROPONINI 1.04*   Hepatic Function Panel  Basename 05/27/11 0231  PROT 6.0  ALBUMIN 3.0*  AST 22  ALT 17  ALKPHOS 107  BILITOT 0.2*  BILIDIR --  IBILI --   No results found for this basename: CHOL in the last 72 hours No results found for this basename: PROTIME in the last 72 hours  Imaging: No results found.  Cardiac Studies:  Assessment/Plan:  1)STEMI Inferior 2) Multivessel CAD including L main disease 3)Dyslipidemia 4) HTN 5)Hypothyroidism 6)Obesity 7) Renal Insufficiency CT surgery to see this am.CXR this am, lipids. Further recs per MD  LOS: 1 day    [name],[name] 05/27/2011, 6:35 AM   Pt. Seen and examined. Agree with the NP/PA-C note as written. Critical 3 vessel CAD . S/p POBA to the RCA. Will need CT surgery evaluation .Marland Kitchen Obviously high risk and not a great surgical candidate, however, PCI options are limited as well.  Currently on heparin and integrillin. Sheath in place. Continue medical therapy for now. Will pull sheath today.

## 2011-05-27 NOTE — Progress Notes (Signed)
  Echocardiogram 2D Echocardiogram has been performed.  Wakeelah Solan Lynn Alie Hardgrove, RDCS 05/27/2011, 11:18 AM

## 2011-05-27 NOTE — ED Provider Notes (Signed)
I saw and evaluated the patient, reviewed the resident's note and I agree with the findings and plan.  Eris Breck, MD 05/27/11 0059 

## 2011-05-27 NOTE — Progress Notes (Signed)
CRITICAL VALUE ALERT  Critical value received: ck-mb 8.7 troponin 1.04  Date of notification:  05/27/2011   Time of notification:  02:40am   Critical value read back:yes  Nurse who received alert:  Natividad Brood  MD notified (1st page):  Dr. Herbie Baltimore   Time of first page: 02:40am  MD notified (2nd page):  Time of second page:  Responding MD:  Dr. Herbie Baltimore  Time MD responded:  02:40am

## 2011-05-27 NOTE — Progress Notes (Signed)
45fr sheath was removed from patient's RFA usinh manual pressure for 20 minutes.  There was no complications during or after sheath pull.  Patient tolerated well and post care instructions were given to patient.  Post vital: BP 132/70, SAT 98% on 2 liters O2, RR 18, Rate 70.

## 2011-05-28 ENCOUNTER — Other Ambulatory Visit: Payer: Self-pay

## 2011-05-28 DIAGNOSIS — E1129 Type 2 diabetes mellitus with other diabetic kidney complication: Secondary | ICD-10-CM | POA: Diagnosis present

## 2011-05-28 DIAGNOSIS — I2510 Atherosclerotic heart disease of native coronary artery without angina pectoris: Secondary | ICD-10-CM

## 2011-05-28 DIAGNOSIS — Z8739 Personal history of other diseases of the musculoskeletal system and connective tissue: Secondary | ICD-10-CM

## 2011-05-28 DIAGNOSIS — I213 ST elevation (STEMI) myocardial infarction of unspecified site: Secondary | ICD-10-CM | POA: Diagnosis present

## 2011-05-28 LAB — CBC
HCT: 43.7 % (ref 36.0–46.0)
Hemoglobin: 13.5 g/dL (ref 12.0–15.0)
MCH: 26.5 pg (ref 26.0–34.0)
MCHC: 30.9 g/dL (ref 30.0–36.0)
RDW: 16.3 % — ABNORMAL HIGH (ref 11.5–15.5)

## 2011-05-28 LAB — GLUCOSE, CAPILLARY
Glucose-Capillary: 110 mg/dL — ABNORMAL HIGH (ref 70–99)
Glucose-Capillary: 133 mg/dL — ABNORMAL HIGH (ref 70–99)

## 2011-05-28 LAB — HEPARIN LEVEL (UNFRACTIONATED): Heparin Unfractionated: 0.61 IU/mL (ref 0.30–0.70)

## 2011-05-28 LAB — CARDIAC PANEL(CRET KIN+CKTOT+MB+TROPI)
CK, MB: 6.5 ng/mL (ref 0.3–4.0)
Relative Index: 4.8 — ABNORMAL HIGH (ref 0.0–2.5)
Total CK: 135 U/L (ref 7–177)
Troponin I: 0.59 ng/mL (ref <0.30)

## 2011-05-28 MED ORDER — MAGNESIUM HYDROXIDE NICU ORAL SYRINGE 400 MG/5 ML
30.0000 mL | Freq: Every day | ORAL | Status: DC | PRN
  Filled 2011-05-28: qty 30

## 2011-05-28 MED ORDER — SODIUM CHLORIDE 0.9 % IV SOLN
INTRAVENOUS | Status: DC
  Administered 2011-05-28 – 2011-05-29 (×2): via INTRAVENOUS
  Administered 2011-05-29: 100 mL via INTRAVENOUS

## 2011-05-28 MED ORDER — ENOXAPARIN SODIUM 30 MG/0.3ML ~~LOC~~ SOLN
30.0000 mg | SUBCUTANEOUS | Status: DC
  Administered 2011-05-28 – 2011-05-31 (×4): 30 mg via SUBCUTANEOUS
  Filled 2011-05-28 (×5): qty 0.3

## 2011-05-28 MED ORDER — INSULIN ASPART 100 UNIT/ML ~~LOC~~ SOLN
0.0000 [IU] | Freq: Three times a day (TID) | SUBCUTANEOUS | Status: DC
  Administered 2011-05-30: 1 [IU] via SUBCUTANEOUS
  Administered 2011-05-30: 2 [IU] via SUBCUTANEOUS
  Administered 2011-05-31: 1 [IU] via SUBCUTANEOUS
  Filled 2011-05-28: qty 3

## 2011-05-28 NOTE — Progress Notes (Deleted)
CARDIAC REHAB PHASE I   PRE:  Rate/Rhythm: 82 sr  BP:  Supine:  Sitting: 123/50  Standing:    SaO2: 100 2lnccc  MODE:  Ambulation:  Up to chair  POST:  Rate/Rhythem: 94  BP:  Supine:   Sitting: 116/92 rck 107/59  Standing:    SaO2: 100 2lncc  [name]  Activity orders- up to chair.  Pt assisted up to chair.  Pt tolerated well with no complaints.  Family at bedside.  Call bell in place.  Pt educated on Phase I Cardiac Rehab.  Will continue to monitor and progress activity as ordered by the physcian.  [phone]

## 2011-05-28 NOTE — Progress Notes (Signed)
Pt family called for [name] to see pt. Noticed pt eyes appear more bloodshot then this am. Call placed to [place], [place].

## 2011-05-28 NOTE — Progress Notes (Signed)
PRE: Rate/Rhythm: 82 sr  BP: Supine: Sitting: 123/50 Standing:  SaO2: 100 2lnccc  MODE: Ambulation: Up to chair  POST: Rate/Rhythem: 94  BP: Supine: Sitting: 116/92 rck 107/59 Standing:  SaO2: 100 2lncc  [name]  Activity orders- up to chair. Pt assisted up to chair. Pt tolerated well with no complaints. Family at bedside. Call bell in place. Pt educated on Phase I Cardiac Rehab. Will continue to monitor and progress activity as ordered by the physcian. [phone]

## 2011-05-28 NOTE — Progress Notes (Signed)
  Subjective:  No chest pain, quiet night  Objective:  Vital Signs in the last 24 hours: Temp:  [98 F (36.7 C)-98.2 F (36.8 C)] 98.2 F (36.8 C) (11/17 0400) Pulse Rate:  [56-93] 69  (11/17 0716) Resp:  [12-23] 15  (11/17 0716) BP: (97-161)/(35-124) 114/51 mmHg (11/17 0716) SpO2:  [97 %-100 %] 99 % (11/17 0716) Arterial Line BP: (130-146)/(57-63) 130/57 mmHg (11/16 1015) Weight:  [98.7 kg (217 lb 9.5 oz)] 217 lb 9.5 oz (98.7 kg) (11/17 0500)  Intake/Output from previous day: 11/16 0701 - 11/17 0700 In: 1147.7 [I.V.:1147.7] Out: 1370 [Urine:1370]  Physical Exam: General appearance: alert, cooperative and no distress Lungs: clear to auscultation bilaterally Heart: regular rate and rhythm, S1, S2 normal, no murmur, click, rub or gallop Extremities: no hematoma rt groin   Rate: 65  Rhythm: normal sinus rhythm  Lab Results:  Basename 05/28/11 0630 05/27/11 0842  WBC 6.1 8.6  HGB 13.5 12.8  PLT 184 195    Basename 05/27/11 0231 05/26/11 2235  NA 142 144  K 4.4 3.8  CL 108 114*  CO2 24 --  GLUCOSE 146* 147*  BUN 40* 41*  CREATININE 1.62* 2.00*    Basename 05/27/11 0528 05/27/11 0240  TROPONINI 1.31* 1.04*   Hepatic Function Panel  Basename 05/27/11 0231  PROT 6.0  ALBUMIN 3.0*  AST 22  ALT 17  ALKPHOS 107  BILITOT 0.2*  BILIDIR --  IBILI --    Basename 05/27/11 0842  CHOL 189   No results found for this basename: INR in the last 72 hours  Imaging: Dg Chest Port 1 View  05/27/2011  *RADIOLOGY REPORT*  Clinical Data: Acute myocardial infarction.  PORTABLE CHEST - 1 VIEW  Comparison: 07/23/2010  Findings: The heart size and vascularity are normal and the lungs are clear.  No osseous abnormality.  IMPRESSION: Normal chest.  Original Report Authenticated By: Gwynn Burly, M.D.    Cardiac Studies:2D EF 60-65%  Assessment/Plan:   Principal Problem:  *ST elevation (STEMI) myocardial infarction, treated with RCA PCI (POBA only) Active Problems:  CAD, multiple vessel, residual 80% LM, LAD  Renal insufficiency, cr 2.0  Diabetes type 2, controlled  Hypertension  Dyslipidemia  Hypothyroidism  History of gout  Plan- I have contacted CVTS to see. Will hold ACE and diuretics with renal insufficiency. ? Continue Integrelin, heparin.Marland Kitchen Hydrate      Luke Kilroy PA-C 05/28/2011, 7:52 AM Agree with note written by [name] PAC  Pt is S/P PCI of RCA (no stent)  in setting of inf STEMI. She has 80% ostial LM dz and nl LV fxn by 2D. No recurrent CP. Will D/C integrillin. OOB to chair. Long discussion with pt and daughter about CABG vs medical Rx vs high risk LM stent. They are aware of all the issues and apparently Dr. Cornelius Moras spent a long time thoroughly discussing the risks and benefits. They are still deciding what course they want to pursue.  Runell Gess 05/28/2011 8:31 AM

## 2011-05-28 NOTE — Progress Notes (Signed)
TCTS BRIEF PROGRESS NOTE   Mrs Morash remains clinically stable.  No chest pain. No SOB. Exam unchanged. I again spent in excess of 20-30 minutes discussing options with Mrs. Consiglio and her family.    Assessment/Plan:  I have made it quite clear that she likely would survive CABG surgery, but that her physical recovery would be very slow even in the absence of any complications.  Surgery would offer likely better long term survival but a challenging recovery and the possibility of further decline in her already limited functional status.  Stenting of RCA (culprit lesion for her acute MI) would likely be relatively low risk and quickly get her back to her existing quality of life.  However, this would obviously leave the left main stenosis untreated.  Based upon the very limited examination of the left coronary system at the time of her cath, it's hard to predict with any confidence how threatening the left main stenosis might be in the near future.  She might do well for several months or even years, but she would be at significantly higher risk for repeat cardiac event if she chooses not to go through CABG.  Nevertheless, this is not an unreasonable choice for her under the circumstances.  I would not favor high risk PCI of left main over surgery.  Furthermore, I would not favor stenting of RCA if the patient thinks she would reconsider and want CABG in the future when she ultimately develops recurrent symptoms of CAD.  Mrs. Badour seems inclined to favor the least invasive approach.  For an 77yo with limited functional status and numerous serious comorbidities, the least invasive approach may be the most appropriate.  All questions answered.  OWEN,CLARENCE H

## 2011-05-28 NOTE — Progress Notes (Addendum)
ANTICOAGULATION CONSULT NOTE - Follow Up Consult  Pharmacy Consult for heparin Indication: CAD  No Known Allergies  Patient Measurements: Height: 5\' 2"  (157.5 cm) Weight: 217 lb 9.5 oz (98.7 kg) IBW/kg (Calculated) : 50.1  Adjusted Body Weight:  Vital Signs: Temp: 98.2 F (36.8 C) (11/17 0400) Temp src: Oral (11/17 0400) BP: 114/51 mmHg (11/17 0716) Pulse Rate: 69  (11/17 0716)  Labs:  Basename 05/28/11 0630 05/27/11 1947 05/27/11 0842 05/27/11 0528 05/27/11 0240 05/27/11 0231 05/26/11 2235  HGB 13.5 -- 12.8 -- -- -- --  HCT 43.7 -- 40.0 -- -- 39.4 --  PLT 184 -- 195 -- -- 202 --  APTT -- -- -- -- -- -- --  LABPROT -- -- -- -- -- -- --  INR -- -- -- -- -- -- --  HEPARINUNFRC 0.61 0.21* 0.27* -- -- -- --  CREATININE -- -- -- -- -- 1.62* 2.00*  CKTOTAL -- -- -- 151 123 -- --  CKMB -- -- -- 10.1* 8.7* -- --  TROPONINI -- -- -- 1.31* 1.04* -- --   Estimated Creatinine Clearance: 26.8 ml/min (by C-G formula based on Cr of 1.62).   Assessment: Patient is an 77 y.o F on heparin for 3 vessels dz CAD.  Awaiting family's decision on CABG procedure for patient.  Heparin level is supratherapeutic at 0.61 this AM after rate increased to 1050 units/hr last night.  Goal of Therapy:  Heparin level 0.3-0.5 (while on Integrilin)   Plan:  1) Decrease heparin gtt at 1000 units/hr 2) Recheck another 8 hour heparin level to assess new regimen   [name] P 05/28/2011,7:43 AM  Heparin and Integrilin d/ced.  Start Lovenox for VTE prophylaxis.  Will initiate lovenox 30 mg SQ q24h.

## 2011-05-29 LAB — URINALYSIS, ROUTINE W REFLEX MICROSCOPIC
Glucose, UA: NEGATIVE mg/dL
Ketones, ur: NEGATIVE mg/dL
Nitrite: NEGATIVE
Protein, ur: NEGATIVE mg/dL
Protein, ur: NEGATIVE mg/dL
Specific Gravity, Urine: 1.023 (ref 1.005–1.030)
Urobilinogen, UA: 1 mg/dL (ref 0.0–1.0)
Urobilinogen, UA: 1 mg/dL (ref 0.0–1.0)

## 2011-05-29 LAB — URINE MICROSCOPIC-ADD ON

## 2011-05-29 LAB — BASIC METABOLIC PANEL
BUN: 33 mg/dL — ABNORMAL HIGH (ref 6–23)
CO2: 25 mEq/L (ref 19–32)
Calcium: 8.9 mg/dL (ref 8.4–10.5)
Chloride: 109 mEq/L (ref 96–112)
Creatinine, Ser: 2.01 mg/dL — ABNORMAL HIGH (ref 0.50–1.10)
GFR calc Af Amer: 25 mL/min — ABNORMAL LOW (ref >90)
GFR calc non Af Amer: 21 mL/min — ABNORMAL LOW (ref >90)
Glucose, Bld: 120 mg/dL — ABNORMAL HIGH (ref 70–99)
Potassium: 3.7 mEq/L (ref 3.5–5.1)
Sodium: 143 mEq/L (ref 135–145)

## 2011-05-29 LAB — CBC
HCT: 39 % (ref 36.0–46.0)
Hemoglobin: 12.3 g/dL (ref 12.0–15.0)
MCH: 26.7 pg (ref 26.0–34.0)
MCHC: 31.5 g/dL (ref 30.0–36.0)
RBC: 4.6 MIL/uL (ref 3.87–5.11)

## 2011-05-29 LAB — GLUCOSE, CAPILLARY
Glucose-Capillary: 63 mg/dL — ABNORMAL LOW (ref 70–99)
Glucose-Capillary: 93 mg/dL (ref 70–99)
Glucose-Capillary: 93 mg/dL (ref 70–99)

## 2011-05-29 MED ORDER — ASPIRIN 81 MG PO CHEW
324.0000 mg | CHEWABLE_TABLET | ORAL | Status: AC
  Administered 2011-05-30: 324 mg via ORAL
  Filled 2011-05-29: qty 4

## 2011-05-29 MED ORDER — ACETAMINOPHEN 325 MG PO TABS: 650.0000 mg | ORAL_TABLET | ORAL | Status: DC | PRN

## 2011-05-29 MED ORDER — ONDANSETRON HCL 4 MG/2ML IJ SOLN
4.0000 mg | Freq: Four times a day (QID) | INTRAMUSCULAR | Status: DC | PRN
Start: 2011-05-29 — End: 2011-06-01

## 2011-05-29 MED ORDER — SODIUM CHLORIDE 0.9 % IV SOLN: 1.0000 mL/kg/h | INTRAVENOUS | Status: DC

## 2011-05-29 NOTE — Progress Notes (Signed)
TCTS BRIEF PROGRESS NOTE   Clinically stable.  Assessment/Plan:  Plans for stenting of RCA noted.  Please call if we can be of further assistance.  OWEN,CLARENCE H

## 2011-05-29 NOTE — Progress Notes (Signed)
  Subjective:  No chest pain  Objective:  Vital Signs in the last 24 hours: Temp:  [98 F (36.7 C)-98.8 F (37.1 C)] 98.8 F (37.1 C) (11/18 0400) Pulse Rate:  [64-98] 76  (11/18 0700) Resp:  [13-23] 17  (11/18 0700) BP: (91-139)/(37-90) 126/46 mmHg (11/18 0600) SpO2:  [96 %-99 %] 99 % (11/18 0700) Weight:  [101.3 kg (223 lb 5.2 oz)] 223 lb 5.2 oz (101.3 kg) (11/18 0500)  Intake/Output from previous day: 11/17 0701 - 11/18 0700 In: 2626.6 [P.O.:370; I.V.:2256.6] Out: 200 [Urine:200]  Physical Exam: General appearance: alert, cooperative and no distress Lungs: clear to auscultation bilaterally Heart: regular rate and rhythm, S1, S2 normal, no murmur, click, rub or gallop   Rate: 74  Rhythm: normal sinus rhythm  Lab Results:  Basename 05/28/11 0630 05/27/11 0842  WBC 6.1 8.6  HGB 13.5 12.8  PLT 184 195    Basename 05/27/11 0231 05/26/11 2235  NA 142 144  K 4.4 3.8  CL 108 114*  CO2 24 --  GLUCOSE 146* 147*  BUN 40* 41*  CREATININE 1.62* 2.00*    Basename 05/28/11 1152 05/27/11 0528  TROPONINI 0.59* 1.31*   Hepatic Function Panel  Basename 05/27/11 0231  PROT 6.0  ALBUMIN 3.0*  AST 22  ALT 17  ALKPHOS 107  BILITOT 0.2*  BILIDIR --  IBILI --    Basename 05/27/11 0842  CHOL 189   No results found for this basename: INR in the last 72 hours  Imaging: No results found.  Cardiac Studies:  Assessment/Plan:   Principal Problem:  *ST elevation (STEMI) myocardial infarction Active Problems:  CAD, multiple vessel  Renal insufficiency  Diabetes type 2, controlled  Hypertension  Dyslipidemia  Hypothyroidism  History of gout   Plan- Dr Allyson Sabal to discuss options with patient. Heparin stopped yesterday secondary to some sclarea bleeding   [name] PA-C 05/29/2011, 8:05 AM    Agree with note written by [name] PAC  Pt is S/P POBA RCA 3 days ago. She has significant LM disease. No CP. Hemodynamically stable. Iv Hep D/Cd secondary to  nasal bleeding. Long discussion with Pt , family and Dr. Cornelius Moras. I think best option considering ALL issues is stent to RCA tomorrow and med treatment of LM . Pt understands the risks. Will schedule tomorrow with Dr. Perry Mount J 05/29/2011 8:12 AM

## 2011-05-30 ENCOUNTER — Encounter (HOSPITAL_COMMUNITY)
Admission: EM | Disposition: A | Discharge status: Home Health | Payer: Self-pay | Source: Ambulatory Visit | Attending: Cardiology

## 2011-05-30 LAB — CBC
HCT: 38.9 % (ref 36.0–46.0)
Hemoglobin: 12.2 g/dL (ref 12.0–15.0)
MCH: 26.6 pg (ref 26.0–34.0)
MCHC: 31.4 g/dL (ref 30.0–36.0)

## 2011-05-30 LAB — BASIC METABOLIC PANEL
BUN: 35 mg/dL — ABNORMAL HIGH (ref 6–23)
CO2: 24 mEq/L (ref 19–32)
Calcium: 9.2 mg/dL (ref 8.4–10.5)
Chloride: 110 mEq/L (ref 96–112)
Creatinine, Ser: 1.92 mg/dL — ABNORMAL HIGH (ref 0.50–1.10)
GFR calc Af Amer: 26 mL/min — ABNORMAL LOW (ref >90)
GFR calc non Af Amer: 22 mL/min — ABNORMAL LOW (ref >90)
Glucose, Bld: 84 mg/dL (ref 70–99)
Potassium: 3.7 mEq/L (ref 3.5–5.1)
Sodium: 144 mEq/L (ref 135–145)

## 2011-05-30 LAB — GLUCOSE, CAPILLARY
Glucose-Capillary: 109 mg/dL — ABNORMAL HIGH (ref 70–99)
Glucose-Capillary: 72 mg/dL (ref 70–99)
Glucose-Capillary: 129 mg/dL — ABNORMAL HIGH (ref 70–99)

## 2011-05-30 LAB — PROTIME-INR
INR: 1.18 (ref 0.00–1.49)
Prothrombin Time: 15.3 seconds — ABNORMAL HIGH (ref 11.6–15.2)

## 2011-05-30 SURGERY — PERCUTANEOUS CORONARY STENT INTERVENTION (PCI-S)
Anesthesia: Choice

## 2011-05-30 MED ORDER — CIPROFLOXACIN HCL 250 MG PO TABS
250.0000 mg | ORAL_TABLET | Freq: Two times a day (BID) | ORAL | Status: DC
  Administered 2011-05-30: 250 mg via ORAL
  Filled 2011-05-30 (×3): qty 1

## 2011-05-30 MED ORDER — CIPROFLOXACIN HCL 250 MG PO TABS
250.0000 mg | ORAL_TABLET | Freq: Every day | ORAL | Status: DC
  Administered 2011-05-31 – 2011-06-01 (×2): 250 mg via ORAL
  Filled 2011-05-30 (×3): qty 1

## 2011-05-30 MED ORDER — FUROSEMIDE 10 MG/ML IJ SOLN
40.0000 mg | Freq: Once | INTRAMUSCULAR | Status: AC
  Administered 2011-05-30: 40 mg via INTRAVENOUS
  Filled 2011-05-30: qty 4

## 2011-05-30 NOTE — Progress Notes (Signed)
Physical Therapy Evaluation Patient Details Name: [name] MRN: [id number] DOB: 1920-11-06 Today's Date: 05/30/2011  Problem List:  Patient Active Problem List  Diagnoses  . CAD, multiple vessel  . Hypertension  . Dyslipidemia  . Hypothyroidism  . Renal insufficiency  . ST elevation (STEMI) myocardial infarction  . Diabetes type 2, controlled  . History of gout    Past Medical History:  Past Medical History  Diagnosis Date  . Diabetes mellitus   . Hypertension   . PONV (postoperative nausea and vomiting)   . Angina   . Shortness of breath   . Chronic kidney disease   . Hypothyroidism   . Headache   . Arthritis   . Anxiety   . Myocardial infarction   . Pneumonia    Past Surgical History:  Past Surgical History  Procedure Date  . Joint replacement     both hips replaced  . Cardiac catheterization     PT Assessment/Plan/Recommendation PT Assessment Clinical Impression Statement: Pt would benefit from PT in the acute setting to maximize independence and improve functional mobility to prepare for safe d/c. PT Recommendation/Assessment: Patient will need skilled PT in the acute care venue PT Problem List: Decreased strength;Decreased activity tolerance;Decreased balance;Decreased mobility;Decreased knowledge of use of DME;Cardiopulmonary status limiting activity Barriers to Discharge: None PT Therapy Diagnosis : Difficulty walking;Abnormality of gait;Generalized weakness PT Plan PT Frequency: Min 3X/week PT Treatment/Interventions: DME instruction;Gait training;Stair training;Functional mobility training;Therapeutic exercise;Balance training;Patient/family education PT Recommendation Follow Up Recommendations: Home health PT Equipment Recommended: Defer to next venue PT Goals  Acute Rehab PT Goals PT Goal Formulation: With patient Time For Goal Achievement: 7 days Pt will go Supine/Side to Sit: with modified independence PT Goal: Supine/Side to Sit -  Progress: Progressing toward goal Pt will go Sit to Supine/Side: with modified independence PT Goal: Sit to Supine/Side - Progress: Progressing toward goal Pt will Transfer Sit to Stand/Stand to Sit: with modified independence PT Transfer Goal: Sit to Stand/Stand to Sit - Progress: Progressing toward goal Pt will Transfer Bed to Chair/Chair to Bed: with modified independence PT Transfer Goal: Bed to Chair/Chair to Bed - Progress: Progressing toward goal Pt will Ambulate: 51 - 150 feet;with modified independence;with least restrictive assistive device PT Goal: Ambulate - Progress: Progressing toward goal Pt will Go Up / Down Stairs: 3-5 stairs;with modified independence;with least restrictive assistive device PT Goal: Up/Down Stairs - Progress: Not met  PT Evaluation Precautions/Restrictions  Precautions Precautions: Fall;Other (comment) (Bilateral hip replacements in 2005, 2007) Required Braces or Orthoses: No Restrictions Weight Bearing Restrictions: No Prior Functioning  Home Living Lives With: Spouse Receives Help From: Family Type of Home: House Home Layout: One level Home Access: Stairs to enter Entrance Stairs-Rails: Right;Left;Can reach both Secretary/administrator of Steps: 4 Bathroom Shower/Tub: Health visitor: Handicapped height Bathroom Accessibility: Yes Home Adaptive Equipment: Walker - rolling;Wheelchair - manual;Straight cane;Grab bars in shower Prior Function Level of Independence: Independent with basic ADLs;Needs assistance with homemaking;Requires assistive device for independence;Independent with transfers Meal Prep: Total (Receives meals from [employer]" program) Light Housekeeping: Total (Has someone come to clean her house) Able to Take Stairs?: Yes Driving: No (Pt reports quitting driving approximately 1 year ago) Vocation: Retired Financial risk analyst Arousal/Alertness: Awake/alert Overall Cognitive Status: Appears within  functional limits for tasks assessed Orientation Level: Oriented X4 Sensation/Coordination   Extremity Assessment RUE Assessment RUE Assessment: Within Functional Limits LUE Assessment LUE Assessment: Within Functional Limits RLE Assessment RLE Assessment: Within Functional Limits LLE Assessment LLE  Assessment: Within Functional Limits Mobility (including Balance) Bed Mobility Bed Mobility: Yes Supine to Sit: 4: Min assist;With rails;HOB elevated (Comment degrees) Supine to Sit Details (indicate cue type and reason): Verbal cues for UE and LE placement.  A with LE movement and to maintain balance while initiating and completing task. Sitting - Scoot to Edge of Bed: 4: Min assist;With rail Sitting - Scoot to [place] of Bed Details (indicate cue type and reason): Verbal cues to initiate.  A to maintain balance during scoot. Transfers Transfers: Yes Sit to Stand: 4: Min assist;With upper extremity assist;From bed Sit to Stand Details (indicate cue type and reason): A to initiate task and maintain pt balance. Stand to Sit: 4: Min assist;With armrests;To chair/3-in-1;With upper extremity assist Stand to Sit Details: A to maintain eccentric control with descent. Verbal cues for hand placement on armrests prior to initiating sit. Ambulation/Gait Ambulation/Gait: Yes Ambulation/Gait Assistance: 4: Min assist Ambulation/Gait Assistance Details (indicate cue type and reason): Min A for balance and pt safety.  A to organize lines and leads. Ambulation Distance (Feet): 25 Feet (Ambulation distance limited by telebox.) Assistive device: Rolling walker Gait Pattern: Trunk flexed Gait velocity: Decreased gait speed, but unable to measure due to limited ambulation distance Stairs: No Wheelchair Mobility Wheelchair Mobility: No  Posture/Postural Control Posture/Postural Control: Postural limitations Postural Limitations: Forward flexed trunk Balance Balance Assessed: No  Vitals: BP -  137/53 O2 Sats - 95%  (after ambulation, sitting in recliner) Pt reports 0 pain.  Exercise    End of Session PT - End of Session Equipment Utilized During Treatment: Gait belt;Other (comment) (RW) Activity Tolerance: Patient tolerated treatment well Patient left: in chair;with family/visitor present;with call bell in reach Nurse Communication: Mobility status for transfers;Mobility status for ambulation General Behavior During Session: [facility] for tasks performed Cognition: [facility] for tasks performed  Lacinda Axon 05/30/2011, 3:38 PM

## 2011-05-30 NOTE — Progress Notes (Addendum)
Pt. Seen and examined. Agree with the NP/PA-C note as written except for the following:  Pt. Seen and examined. Case discussed this am with my partners (Drs. Herbie Baltimore, Dorothea Ogle, Alanda Amass and Croitoru), there is now 100% agreement among our practice that there is no role for stenting in the RCA in an asymptomatic patient after successful POBA. There is risk of restenosis, which may be as high as 50%, however this is less likely present with acute occlusion and STEMI. There is also risk of worsening renal function due to her chronic kidney disease. The main problem is her 80-95% LM lesion.  She is felt by surgery to likely be able to survive surgery, however, she would most likely not thrive thereafter and could likely require long-term nursing home placement. The patient has subsequently decided not to pursue surgery. Stenting the LM lesion would be high risk according to my interventional cardiology partners, with the possibility of a salvage surgical operation if there were complications. She is not interested in this.  We will then pursue medical management. At this point, I would discontinue her IV fluids. Old records indicate she has chronic kidney disease with a Cr of ~2.0. She may need gentle diuresis due to some lower extremity edema. Will need to get her ambulating with PT and evaluated for home needs by OT.  I also discussed out of hospital DNR which should be strongly considered. The discussion was with the patient and the family who was present and they voiced their understanding of the difficult situation and as to why we have changed our recommendations several times over the past few days. The asked that I contact Dr. Juleen China her primary physician to update him on the patient's situation.  We will work toward discharge sometime this week based on her needs.  Chrystie Nose, MD Attending Cardiologist [facility]

## 2011-05-30 NOTE — Progress Notes (Signed)
Subjective: No chest pain.No SOB.  Objective: Vital signs in last 24 hours: Temp:  [97.9 F (36.6 C)-98.7 F (37.1 C)] 98.7 F (37.1 C) (11/19 0400) Pulse Rate:  [63-88] 65  (11/19 0500) Resp:  [14-23] 15  (11/19 0500) BP: (95-139)/(31-73) 112/37 mmHg (11/19 0400) SpO2:  [93 %-99 %] 97 % (11/19 0500) Weight:  [103.9 kg (229 lb 0.9 oz)] 229 lb 0.9 oz (103.9 kg) (11/19 0443) Weight change: 2.6 kg (5 lb 11.7 oz) Last BM Date: 05/28/11 Intake/Output from previous day: + 5000 cc in 48 hours. 11/18 0701 - 11/19 0700 In: 3300 [P.O.:1200; I.V.:2100] Out: 650 [Urine:650] Intake/Output this shift: Total I/O In: 1240 [P.O.:240; I.V.:1000] Out: 450 [Urine:450]  PE:  GENERAL: HEART: LUNGS: ABD: EXT:   Lab Results:  Basename 05/29/11 0930 05/28/11 0630  WBC 11.0* 6.1  HGB 12.3 13.5  HCT 39.0 43.7  PLT 167 184   BMET  Basename 05/29/11 0930  NA 143  K 3.7  CL 109  CO2 25  GLUCOSE 120*  BUN 33*  CREATININE 2.01*  CALCIUM 8.9    Basename 05/28/11 1152  TROPONINI 0.59*    Lab Results  Component Value Date   CHOL 189 05/27/2011   HDL 66 05/27/2011   LDLCALC 108* 05/27/2011   TRIG 73 05/27/2011   CHOLHDL 2.9 05/27/2011   Lab Results  Component Value Date   HGBA1C 5.9* 05/27/2011     Lab Results  Component Value Date   TSH 2.679 05/27/2011    Hepatic Function Panel No results found for this basename: PROT,ALBUMIN,AST,ALT,ALKPHOS,BILITOT,BILIDIR,IBILI in the last 72 hours  Basename 05/27/11 0842  CHOL 189   No results found for this basename: PROTIME in the last 72 hours    EKG: Orders placed during the hospital encounter of 05/26/11  . EKG 12-LEAD  . EKG 12-LEAD  . EKG 12-LEAD  . EKG 12-LEAD  . EKG 12-LEAD  . EKG 12-LEAD  . EKG 12-LEAD  . EKG 12-LEAD  . EKG 12-LEAD  . EKG 12-LEAD    Studies/Results: No results found.  Medications: I have reviewed the patient's current medications.  Assessment/Plan: Patient Active Problem List    Diagnoses  . CAD, multiple vessel  . Hypertension  . Dyslipidemia  . Hypothyroidism  . Renal insufficiency  . ST elevation (STEMI) myocardial infarction  . Diabetes type 2, controlled  . History of gout    PLAN:   For stent to  The RCA today.  POBA of the RCA on the 15th on admit for STEMI with culprit vessel the RCA.  Because of severe LM disease TCTS was consulted. After discussions with pt. And family plan is to place the stent to RCA and medically treat the LM disease.  Acute on chronic renal failure. May need to hold off on PCI today-  LABS PENDING. But if cr. Improved will proceed.   LOS: 4 days   Akasia Ahmad R 05/30/2011, 6:00 AM

## 2011-05-31 ENCOUNTER — Encounter (HOSPITAL_COMMUNITY): Payer: Self-pay

## 2011-05-31 LAB — CBC
MCH: 26.5 pg (ref 26.0–34.0)
MCHC: 31.5 g/dL (ref 30.0–36.0)
Platelets: 172 10*3/uL (ref 150–400)

## 2011-05-31 LAB — MAGNESIUM: Magnesium: 1.8 mg/dL (ref 1.5–2.5)

## 2011-05-31 LAB — BASIC METABOLIC PANEL
Calcium: 8.8 mg/dL (ref 8.4–10.5)
Creatinine, Ser: 1.91 mg/dL — ABNORMAL HIGH (ref 0.50–1.10)
GFR calc non Af Amer: 22 mL/min — ABNORMAL LOW (ref >90)
Glucose, Bld: 80 mg/dL (ref 70–99)
Sodium: 143 mEq/L (ref 135–145)

## 2011-05-31 LAB — URINE CULTURE
Colony Count: NO GROWTH
Culture: NO GROWTH

## 2011-05-31 LAB — GLUCOSE, CAPILLARY
Glucose-Capillary: 78 mg/dL (ref 70–99)
Glucose-Capillary: 119 mg/dL — ABNORMAL HIGH (ref 70–99)
Glucose-Capillary: 91 mg/dL (ref 70–99)
Glucose-Capillary: 131 mg/dL — ABNORMAL HIGH (ref 70–99)

## 2011-05-31 LAB — PRO B NATRIURETIC PEPTIDE: Pro B Natriuretic peptide (BNP): 800.6 pg/mL — ABNORMAL HIGH (ref 0–450)

## 2011-05-31 MED ORDER — SODIUM CHLORIDE 0.9 % IJ SOLN
3.0000 mL | Freq: Two times a day (BID) | INTRAMUSCULAR | Status: DC
  Administered 2011-05-31 (×2): 3 mL via INTRAVENOUS

## 2011-05-31 MED ORDER — DIPHENHYDRAMINE HCL 25 MG PO CAPS
25.0000 mg | ORAL_CAPSULE | Freq: Once | ORAL | Status: AC
  Administered 2011-05-31: 25 mg via ORAL
  Filled 2011-05-31: qty 1

## 2011-05-31 NOTE — Progress Notes (Signed)
Subjective:  No acute concerns overnight per NS, pt without c/o CP and or SOB sx.  Objective:  Vital Signs in the last 24 hours: Temp:  [97.9 F (36.6 C)-98.9 F (37.2 C)] 98.3 F (36.8 C) (11/20 0400) Pulse Rate:  [63-96] 70  (11/20 0400) Resp:  [13-24] 17  (11/20 0400) BP: (105-139)/(30-86) 127/45 mmHg (11/20 0400) SpO2:  [91 %-100 %] 99 % (11/20 0400) Weight:  [103.5 kg (228 lb 2.8 oz)] 228 lb 2.8 oz (103.5 kg) (11/20 0400)  Intake/Output from previous day: 11/19 0701 - 11/20 0700 In: 610 [P.O.:510; I.V.:100] Out: 2560 [Urine:2560] Intake/Output from this shift: Total I/O In: 150 [P.O.:150] Out: 550 [Urine:550]  Physical Exam: PE not performed  Lab Results:  Basename 05/30/11 0600 05/29/11 0930  WBC 9.9 11.0*  HGB 12.2 12.3  PLT 174 167    Basename 05/30/11 0600 05/29/11 0930  NA 144 143  K 3.7 3.7  CL 110 109  CO2 24 25  GLUCOSE 84 120*  BUN 35* 33*  CREATININE 1.92* 2.01*    Basename 05/28/11 1152  TROPONINI 0.59*   Hepatic Function Panel No results found for this basename: PROT,ALBUMIN,AST,ALT,ALKPHOS,BILITOT,BILIDIR,IBILI in the last 72 hours No results found for this basename: CHOL in the last 72 hours No results found for this basename: PROTIME in the last 72 hours  Imaging: No results found.  Cardiac Studies:  Assessment/Plan:  1) recent STEMI, s/p DX LHC, Medical mgmt 2) Multi vessel CAD, RCA and LM 3)CKD 4)DM 5)HTN 6)HLD 7)Hypothyroidism 8)Gout  LOS: 5 days    SIMON,SPENCER E 05/31/2011, 6:00 AM  Agree with note written by Donell Sievert PAC  Day #5 Inf STEMI with LM disease and preserved LV fxn by 2D. Some SOB but no CP. Cr stable at 2. Diuresed yesterday. Long discussion with pt and family. Final decision is medical therapy and no stent. Will get SS consult. OOB to chair. Transfer to [employer] J 05/31/2011 9:23 AM

## 2011-05-31 NOTE — Progress Notes (Signed)
At 1400, pt's family called RN to room regarding rash.  Pt has unraised, ascending red rash on bilateral arms.  Rash causes itching per pt.  RN notified Dr. Allyson Sabal of rash and MD came to bedside to assess.  MD ordered Benadryl x1 and to d/c statin.  RN will continue to monitor pt.

## 2011-05-31 NOTE — Progress Notes (Signed)
CARDIAC REHAB PHASE I   PRE:  Rate/Rhythm: 77 SR  BP:  Supine:   Sitting: 117/49  Standing:    SaO2: 98% 2L  MODE:  Ambulation: 220  ft   POST:  Rate/Rhythem: 96 SR  BP:  Supine:   Sitting: 126/64  Standing:    SaO2: 99% 2L  1115 - 1200 PT AMBULATED ON 2L O2 TOLERATED FAIRLY WELL. SLIGHT SOB, STATES "NORMAL" FOR HER. BACK TO CHAIR WITH FEET ELEVATED AND CALL BELL IN REACH.  EDUCATION ON NITRO AND MI BOOK GIVEN.   Rosalie Doctor

## 2011-06-01 LAB — BASIC METABOLIC PANEL
Calcium: 9.1 mg/dL (ref 8.4–10.5)
GFR calc Af Amer: 25 mL/min — ABNORMAL LOW (ref >90)
GFR calc non Af Amer: 21 mL/min — ABNORMAL LOW (ref >90)
Glucose, Bld: 84 mg/dL (ref 70–99)
Potassium: 3.7 mEq/L (ref 3.5–5.1)
Sodium: 142 mEq/L (ref 135–145)

## 2011-06-01 LAB — CBC
Hemoglobin: 11.6 g/dL — ABNORMAL LOW (ref 12.0–15.0)
MCH: 26.5 pg (ref 26.0–34.0)
MCHC: 31.4 g/dL (ref 30.0–36.0)
Platelets: 177 10*3/uL (ref 150–400)
RDW: 16.1 % — ABNORMAL HIGH (ref 11.5–15.5)

## 2011-06-01 MED ORDER — ACETAMINOPHEN 325 MG PO TABS: 650.0000 mg | ORAL_TABLET | ORAL | Status: AC | PRN

## 2011-06-01 MED ORDER — CLOPIDOGREL BISULFATE 75 MG PO TABS: 75.0000 mg | ORAL_TABLET | Freq: Every day | ORAL | Status: DC

## 2011-06-01 MED ORDER — NITROGLYCERIN 0.4 MG SL SUBL: 0.4000 mg | SUBLINGUAL_TABLET | SUBLINGUAL | Status: DC | PRN

## 2011-06-01 MED ORDER — PANTOPRAZOLE SODIUM 40 MG PO TBEC: 40.0000 mg | DELAYED_RELEASE_TABLET | Freq: Every day | ORAL | Status: DC

## 2011-06-01 MED ORDER — CLOPIDOGREL BISULFATE 75 MG PO TABS
75.0000 mg | ORAL_TABLET | Freq: Every day | ORAL | Status: DC
  Administered 2011-06-01: 75 mg via ORAL
  Filled 2011-06-01: qty 1

## 2011-06-01 NOTE — Progress Notes (Signed)
   CARE MANAGEMENT NOTE 06/01/2011  Patient:  [name], [name]   Account Number:  [id number]  Date Initiated:  06/01/2011  Documentation initiated by:  GRAVES-BIGELOW,Winda Summerall  Subjective/Objective Assessment:   Pt admitted with stemi. Pt is form home with husband and has family support of son.     Action/Plan:   CM spoke to family and they wanted hh services of RN and PT and aide.   Anticipated DC Date:  06/01/2011   Anticipated DC Plan:  HOME W HOME HEALTH SERVICES      DC Planning Services  CM consult      [facility]  HOME HEALTH   Choice offered to / List presented to:  C-4 Adult Children        HH arranged  HH-10 DISEASE MANAGEMENT  HH-1 RN  HH-2 PT  HH-4 NURSE'S AIDE      HH agency  [facility]   Status of service:  Completed, signed off Medicare Important Message given?   (If response is "NO", the following Medicare IM given date fields will be blank) Date Medicare IM given:   Date Additional Medicare IM given:    Discharge Disposition:  HOME W HOME HEALTH SERVICES  Per UR Regulation:    Comments:  06-01-11 0949 Tomi Bamberger, RN,BSN [phone] CM made referral with Hilda Lias with [facility]. SOC to begin within 24-48 hrs post d/c. CM provided family with information on PCS. Per pt's son he and his wife will be providing care for pt. Pt is planned for d/c today.

## 2011-06-01 NOTE — Progress Notes (Signed)
Patient ID: [name], female   DOB: [birth year], 77 y.o.   MRN: [id number] Subjective:  Up in halls, no chest pain. Rash less puritic.  Objective:  Vital Signs in the last 24 hours: Temp:  [97.7 F (36.5 C)-98.8 F (37.1 C)] 97.7 F (36.5 C) (11/21 0800) Pulse Rate:  [72-102] 79  (11/21 0800) Resp:  [16-25] 16  (11/21 0800) BP: (113-150)/(35-85) 113/56 mmHg (11/21 0800) SpO2:  [97 %-100 %] 99 % (11/21 0800) Weight:  [103 kg (227 lb 1.2 oz)] 227 lb 1.2 oz (103 kg) (11/21 0519)  Intake/Output from previous day: 11/20 0701 - 11/21 0700 In: 843 [P.O.:840; I.V.:3] Out: 850 [Urine:850]  Physical Exam: General appearance: alert, cooperative and no distress Lungs: clear to auscultation bilaterally Heart: regular rate and rhythm, S1, S2 normal, no murmur, click, rub or gallop   Rate: 78  Rhythm: normal sinus rhythm  Lab Results:  Basename 06/01/11 0730 05/31/11 0555  WBC 7.7 8.7  HGB 11.6* 11.4*  PLT 177 172    Basename 06/01/11 0730 05/31/11 0555  NA 142 143  K 3.7 3.5  CL 109 109  CO2 23 25  GLUCOSE 84 80  BUN 36* 33*  CREATININE 1.99* 1.91*   No results found for this basename: TROPONINI:2,CK,MB:2 in the last 72 hours Hepatic Function Panel No results found for this basename: PROT,ALBUMIN,AST,ALT,ALKPHOS,BILITOT,BILIDIR,IBILI in the last 72 hours No results found for this basename: CHOL in the last 72 hours  Basename 05/30/11 0600  INR 1.18    Imaging: No results found.  Cardiac Studies: 2D EF 60-65%  Assessment/Plan:   Principal Problem:  *ST elevation (STEMI) myocardial infarction, medical Rx Active Problems:  Drug reaction, either Crestor or Cipro, both stopped, (urine culture negative)  CAD, multiple vessel  Renal insufficiency, stage 3  Diabetes type 2, controlled  Hypertension  Dyslipidemia, on Zetia and Welchol pta  Hypothyroidism  History of gout  Plan- Will discuss with MD, ? D/C today. Not on ACE/ARB secondary to renal  insufficiency.    [name] Shelter PA-C 06/01/2011, 9:44 AM   Agree with [name] notes. Patient seen and examined by me. She has not had any angina pectoris. She was able to ambulate around the intensive care unit without any angina or dyspnea. She had mild discomfort in her left ankle. [name] to be red Dors goes up we tender to suggest gout. She does have a minimal residual ankle edema.  On physical exam her jugular venous pulsations are slightly elevated maybe at 6 cm above the sternal angle. Her lungs clear to auscultation there is no overt evidence of congestive heart failure.  I had a another detailed discussion the family regarding the anatomical coronary situation and the reasons behind her decisions for medical therapy. She should be discharged on aspirin and Plavix.  Importantly she cannot tolerate statins. After the rash has occurred [name] recalls that she was placed on Crestor in the past by Dr.Kohut, and she also developed a rash.space infection of the valve rashes with other statins as well.  We'll discharge today with arrangements made for home physical therapy.

## 2011-06-01 NOTE — Progress Notes (Signed)
CARDIAC REHAB PHASE I   PRE:  Rate/Rhythm: 86SR  BP:  Supine: 144/51  Sitting:   Standing:    SaO2: 92%RA  MODE:  Ambulation: 286 ft   POST:  Rate/Rhythem: 119ST  BP:  Supine:   Sitting: 177/64  Standing:    SaO2: 93%RA  Pt walked 286 ft with rolling walker and asst x 2 with fairly steady gait. Denied CP but c/o SOB. O2 sat checked at 93%.Pt did c/o left ankle pain during walk. Stated she took med daily for gout. To chair after wallk. HR to 119. [phone]  Duanne Limerick

## 2011-06-01 NOTE — Progress Notes (Addendum)
Occupational Therapy Evaluation   Late entry for 05/31/11 [phone])  Patient Details Name: [name] MRN: [id number] DOB: 03-23-1921 Today's Date: 06/01/2011  Problem List:  Patient Active Problem List  Diagnoses  . CAD, multiple vessel  . Hypertension  . Dyslipidemia  . Hypothyroidism  . Renal insufficiency  . ST elevation (STEMI) myocardial infarction  . Diabetes type 2, controlled  . History of gout  . Drug reaction    Past Medical History:  Past Medical History  Diagnosis Date  . Diabetes mellitus   . Hypertension   . PONV (postoperative nausea and vomiting)   . Angina   . Shortness of breath   . Chronic kidney disease   . Hypothyroidism   . Headache   . Arthritis   . Anxiety   . Myocardial infarction   . Pneumonia    Past Surgical History:  Past Surgical History  Procedure Date  . Joint replacement     both hips replaced  . Cardiac catheterization     OT Assessment/Plan/Recommendation OT Assessment Clinical Impression Statement: This 77 y.o. female presents to OT with generalized weakness and decreased activity tolerance.  Feel pt. would benefit from OT to maximize safety and indpendence with BADLs to allow pt. to return home at modified indpendence to min A  OT Recommendation/Assessment: Patient will need skilled OT in the acute care venue OT Problem List: Decreased strength;Decreased activity tolerance;Impaired balance (sitting and/or standing);Cardiopulmonary status limiting activity Barriers to Discharge: None OT Therapy Diagnosis : Generalized weakness OT Plan OT Frequency: Min 2X/week OT Treatment/Interventions: Self-care/ADL training;Energy conservation;Therapeutic activities;Patient/family education;Balance training OT Recommendation Recommendations for Other Services: PT consult Follow Up Recommendations: Home health OT Equipment Recommended: None recommended by OT OT Goals Acute Rehab OT Goals OT Goal Formulation: With  patient/family Time For Goal Achievement: 2 weeks ADL Goals Pt Will Perform Grooming: with supervision;Standing at sink Pt Will Perform Upper Body Bathing: with set-up (No rest breaks and incorporating energy conservation ) Pt Will Perform Lower Body Bathing: with supervision;Sit to stand from chair Pt Will Perform Lower Body Dressing: with adaptive equipment;with supervision Pt Will Transfer to Toilet: with supervision;Ambulation;Comfort height toilet Pt Will Perform Toileting - Clothing Manipulation: with supervision;Standing Pt Will Perform Tub/Shower Transfer: Shower transfer;with min assist (min guard assist) Additional ADL Goal #1: Pt. will incorporate energy conservation techniques I  OT Evaluation Precautions/Restrictions  Precautions Precautions: Fall Required Braces or Orthoses: No Restrictions Weight Bearing Restrictions: No Prior Functioning Home Living Home Adaptive Equipment: Shower chair with back Prior Function Level of Independence: Requires assistive device for independence (uses a sock aid) Comments: Pt. and dtr report that husband has dementia.  He is indpendent with ADLs and mobility, but pt. is caregiver for him.  Pt. and daughter report that she furniture walks at home, and that she requires frequent rest breaks due to SOB ADL ADL Eating/Feeding: Simulated;Independent Where Assessed - Eating/Feeding: Chair Grooming: Simulated;Wash/dry face;Wash/dry hands;Brushing hair;Minimal assistance Where Assessed - Grooming: Standing at sink Upper Body Bathing: Simulated;Set up (with 1-2 rest breaks due to fatigue) Where Assessed - Upper Body Bathing: Sitting, chair Lower Body Bathing: Minimal assistance Where Assessed - Lower Body Bathing: Sit to stand from chair Upper Body Dressing: Simulated;Minimal assistance (due to lines) Where Assessed - Upper Body Dressing: Sitting, chair Lower Body Dressing: Moderate assistance;Simulated Where Assessed - Lower Body Dressing:  Sit to stand from chair Toilet Transfer: Minimal assistance;Simulated Toilet Transfer Method: Ambulating Toilet Transfer Equipment: Comfort height toilet Toileting - Clothing Manipulation: Simulated;Minimal assistance (min  guard assist) Where Assessed - Toileting Clothing Manipulation: Standing Toileting - Hygiene: Simulated;Modified independent Where Assessed - Toileting Hygiene: Sit on 3-in-1 or toilet Tub/Shower Transfer: Not assessed ADL Comments: Pt. able to maintain standing and dynamic standing for ~5 mins. before fatiguing requiring a rest break. Pt. unable to access feet in sitting position in chair Vision/Perception    Cognition Cognition Arousal/Alertness: Awake/alert Overall Cognitive Status: Appears within functional limits for tasks assessed Orientation Level: Oriented X4 Sensation/Coordination Coordination Gross Motor Movements are Fluid and Coordinated: Yes Fine Motor Movements are Fluid and Coordinated: Yes Extremity Assessment RUE Assessment RUE Assessment: Within Functional Limits LUE Assessment LUE Assessment: Within Functional Limits Mobility  Transfers Transfers: Yes Sit to Stand: 4: Min assist Stand to Sit: 4: Min assist Exercises   End of Session OT - End of Session Equipment Utilized During Treatment:  (none) Activity Tolerance: Patient limited by fatigue Patient left: in chair;with family/visitor present;with call bell in reach General Behavior During Session: [facility] for tasks performed Cognition: [facility] for tasks performed   Salle Brandle M 06/01/2011, 11:16 AM

## 2011-06-01 NOTE — Progress Notes (Addendum)
Pt admitted with stemi. Pt is form home with husband and has family support of son. CM spoke to family and they wanted [facility] services of RN and PT and Aide. CM made referral with Hilda Lias of [facility]. SOC to begin within 24-48 hrs post d/c. CM provided family with information on PCS. Per pt's son he and his wife will be providing care for pt. Pt is planned for d/c today. Gala Lewandowsky, [place] 06-01-11 1003.

## 2011-06-01 NOTE — Progress Notes (Signed)
Physical Therapy Treatment Patient Details Name: [name] MRN: [id number] DOB: [birth year] Today's Date: 06/01/2011  PT Assessment/Plan  PT - Assessment/Plan Comments on Treatment Session: Pt tolerated treatment session well.  Ambulation distance limited due to pt walking earlier with cardiac therapy, and L ankle pain.  Pt stated when mentioned to MD he said it may be her gout.  Pt able to tolerate addition of stairs.  Pt required mod A to prevent loss of balance during steps, but reports she always has someone at home to help her when she climbs the 4 stairs to her house.  Pt very pleasant and said she was ready to go home! PT Plan: Discharge plan remains appropriate;Frequency remains appropriate PT Frequency: Min 3X/week Follow Up Recommendations: Home health PT Equipment Recommended: Defer to next venue PT Goals  Acute Rehab PT Goals PT Goal Formulation: With patient Time For Goal Achievement: 7 days PT Goal: Supine/Side to Sit - Progress: Other (comment) (Not assessed) PT Goal: Sit to Supine/Side - Progress: Other (comment) (Not assessed) PT Transfer Goal: Sit to Stand/Stand to Sit - Progress: Progressing toward goal PT Transfer Goal: Bed to Chair/Chair to Bed - Progress: Other (comment) (Not assessed.  Pt is progressing with stand-pivot transfer.) PT Goal: Ambulate - Progress: Progressing toward goal PT Goal: Up/Down Stairs - Progress: Progressing toward goal  PT Treatment Precautions/Restrictions  Precautions Precautions: Fall Required Braces or Orthoses: No Restrictions Weight Bearing Restrictions: No Mobility (including Balance) Bed Mobility Bed Mobility: No Transfers Transfers: Yes Sit to Stand: 5: Supervision;From chair/3-in-1;With armrests;With upper extremity assist Sit to Stand Details (indicate cue type and reason): Supervision for pt safety.  Cues for hand placement on armrests. Stand to Sit: 5: Supervision;With upper extremity assist;With armrests;To  chair/3-in-1 Stand to Sit Details: Supervision for pt safety.  Cues for hand placement. Stand Pivot Transfers: 5: Supervision Stand Pivot Transfer Details (indicate cue type and reason): Supervision for pt safety.  Verbal cues for UE and LE placement. Ambulation/Gait Ambulation/Gait: Yes Ambulation/Gait Assistance: 4: Min assist Ambulation/Gait Assistance Details (indicate cue type and reason): Mingaurd A for pt safety.   Ambulation Distance (Feet): 150 Feet Assistive device: Rolling walker Gait Pattern: Decreased stance time - left;Antalgic;Trunk flexed Gait velocity: Decreased Stairs: Yes Stairs Assistance: 3: Mod assist Stairs Assistance Details (indicate cue type and reason): Mod A to maintain balance, especially going down stairs.  Pt's L leg buckled.  Verbal cues for proper stair technique.  Stair Management Technique: One rail Right;Step to pattern;Forwards;Other (comment) (Hand held assist) Number of Stairs: 4  Wheelchair Mobility Wheelchair Mobility: No  Posture/Postural Control Posture/Postural Control: Postural limitations Postural Limitations: Forward flexed trunk Balance Balance Assessed: No Exercise    End of Session PT - End of Session Equipment Utilized During Treatment: Gait belt;Other (comment) (RW) Activity Tolerance: Patient tolerated treatment well Patient left: in chair;with call bell in reach General Behavior During Session: [facility] for tasks performed Cognition: [facility] for tasks performed  Lacinda Axon 06/01/2011, 12:44 PM  Jake Shark, PT DPT [phone]

## 2011-06-01 NOTE — Discharge Summary (Signed)
Patient ID: JOHNANNA BAKKE,  MRN: [id number], DOB/AGE: 75-Apr-1922 75 y.o.  Admit date: 05/26/2011 Discharge date: 06/01/2011  Primary Care Provider: Dr Juleen China Primary Cardiologist: Dr Herbie Baltimore  Discharge Diagnoses Principal Problem:  * Inferior ST elevation (STEMI) myocardial infarction, POBA to RCA     Severe Ostial Left Main Disease Active Problems:  Drug reaction, rash with Crestor  CAD, multiple vessel, medical Rx  Renal insufficiency, ACE/diuretic/ARB stopped  Diabetes type 2, controlled  Hypertension  Dyslipidemia  Hypothyroidism  History of gout    Procedures Urgent agiogram and POBA to RCA 05/26/11  History of Present Illness Pt presented as STEMI  Hospital Course Ms. Lasch is a pleasant 77 year old female followed by Dr. Juleen China with a history of diabetes and dyslipidemia and hypertension. She presented as an Inferior ST elevation MI on 05/26/2011. He was taken urgently to the cath lab by Dr. Herbie Baltimore. The culprit vessel was felt to be the RCA this was treated with a balloon angioplasty only. She had significant ostial  left main disease of approximately 80% to Dr. Herbie Baltimore did not want to start her on Plavix in case she needed bypass grafting.  A consult was called with the heart surgeons. After discussion with the family and patient the plan was initially to stent the RCA electively. The patient was seen again the next morning by Dr. Tressie Stalker. He had along discussion with the patient and the family and again it was confirmed that the best course of action would be medical therapy. We considered putting a stent in the RCA electively but as the patient was doing well with her angioplasty result and she had left main stenosis we elected to continue to treat her medically. She did develop a rash with the addition of Crestor. In retrospect the patient remembers having taken Crestor in the past with a rash. This was stopped at discharge. We did add Plavix at discharge. She'll  followup with Dr. Herbie Baltimore as an outpatient. She has ambulated without problems in the hallway prior to discharge. Echo done prior to discharge showed her EF to be 60-65%. Troponin peak 1.31.   Discharge Vitals:  Blood pressure 177/64, pulse 94, temperature 97.7 F (36.5 C), temperature source Oral, resp. rate 16, height 5\' 2"  (1.575 m), weight 103 kg (227 lb 1.2 oz), SpO2 99.00%.    Labs: Results for orders placed during the hospital encounter of 05/26/11 (from the past 48 hour(s))  GLUCOSE, CAPILLARY     Status: Abnormal   Collection Time   05/30/11 12:14 PM      Component Value Range Comment   Glucose-Capillary 117 (*) 70 - 99 (mg/dL)   GLUCOSE, CAPILLARY     Status: Abnormal   Collection Time   05/30/11  5:06 PM      Component Value Range Comment   Glucose-Capillary 159 (*) 70 - 99 (mg/dL)   GLUCOSE, CAPILLARY     Status: Abnormal   Collection Time   05/30/11  9:29 PM      Component Value Range Comment   Glucose-Capillary 129 (*) 70 - 99 (mg/dL)   CBC     Status: Abnormal   Collection Time   05/31/11  5:55 AM      Component Value Range Comment   WBC 8.7  4.0 - 10.5 (K/uL)    RBC 4.31  3.87 - 5.11 (MIL/uL)    Hemoglobin 11.4 (*) 12.0 - 15.0 (g/dL)    HCT 82.9  56.2 - 13.0 (%)    MCV 84.0  78.0 - 100.0 (fL)    MCH 26.5  26.0 - 34.0 (pg)    MCHC 31.5  30.0 - 36.0 (g/dL)    RDW 04.5 (*) 40.9 - 15.5 (%)    Platelets 172  150 - 400 (K/uL)   BASIC METABOLIC PANEL     Status: Abnormal   Collection Time   05/31/11  5:55 AM      Component Value Range Comment   Sodium 143  135 - 145 (mEq/L)    Potassium 3.5  3.5 - 5.1 (mEq/L)    Chloride 109  96 - 112 (mEq/L)    CO2 25  19 - 32 (mEq/L)    Glucose, Bld 80  70 - 99 (mg/dL)    BUN 33 (*) 6 - 23 (mg/dL)    Creatinine, Ser 8.11 (*) 0.50 - 1.10 (mg/dL)    Calcium 8.8  8.4 - 10.5 (mg/dL)    GFR calc non Af Amer 22 (*) >90 (mL/min)    GFR calc Af Amer 26 (*) >90 (mL/min)   MAGNESIUM     Status: Normal   Collection Time   05/31/11   5:55 AM      Component Value Range Comment   Magnesium 1.8  1.5 - 2.5 (mg/dL)   PRO B NATRIURETIC PEPTIDE     Status: Abnormal   Collection Time   05/31/11  5:55 AM      Component Value Range Comment   BNP, POC 800.6 (*) 0 - 450 (pg/mL)   GLUCOSE, CAPILLARY     Status: Normal   Collection Time   05/31/11  7:43 AM      Component Value Range Comment   Glucose-Capillary 78  70 - 99 (mg/dL)   GLUCOSE, CAPILLARY     Status: Abnormal   Collection Time   05/31/11 11:48 AM      Component Value Range Comment   Glucose-Capillary 119 (*) 70 - 99 (mg/dL)   GLUCOSE, CAPILLARY     Status: Normal   Collection Time   05/31/11  5:12 PM      Component Value Range Comment   Glucose-Capillary 91  70 - 99 (mg/dL)   GLUCOSE, CAPILLARY     Status: Abnormal   Collection Time   05/31/11  9:38 PM      Component Value Range Comment   Glucose-Capillary 131 (*) 70 - 99 (mg/dL)    Comment 1 Documented in Chart      Comment 2 Notify RN     CBC     Status: Abnormal   Collection Time   06/01/11  7:30 AM      Component Value Range Comment   WBC 7.7  4.0 - 10.5 (K/uL)    RBC 4.37  3.87 - 5.11 (MIL/uL)    Hemoglobin 11.6 (*) 12.0 - 15.0 (g/dL)    HCT 91.4  78.2 - 95.6 (%)    MCV 84.4  78.0 - 100.0 (fL)    MCH 26.5  26.0 - 34.0 (pg)    MCHC 31.4  30.0 - 36.0 (g/dL)    RDW 21.3 (*) 08.6 - 15.5 (%)    Platelets 177  150 - 400 (K/uL)   BASIC METABOLIC PANEL     Status: Abnormal   Collection Time   06/01/11  7:30 AM      Component Value Range Comment   Sodium 142  135 - 145 (mEq/L)    Potassium 3.7  3.5 - 5.1 (mEq/L)    Chloride 109  96 -  112 (mEq/L)    CO2 23  19 - 32 (mEq/L)    Glucose, Bld 84  70 - 99 (mg/dL)    BUN 36 (*) 6 - 23 (mg/dL)    Creatinine, Ser 4.54 (*) 0.50 - 1.10 (mg/dL)    Calcium 9.1  8.4 - 10.5 (mg/dL)    GFR calc non Af Amer 21 (*) >90 (mL/min)    GFR calc Af Amer 25 (*) >90 (mL/min)   GLUCOSE, CAPILLARY     Status: Normal   Collection Time   06/01/11  7:57 AM      Component  Value Range Comment   Glucose-Capillary 96  70 - 99 (mg/dL)    Comment 1 Notify RN       Disposition:  Follow-up Information    Follow up with HARDING,DAVID W. (office will call)    Contact information:   [facility] [address], [address] [place] [address] [phone]          Discharge Medications:  Current Discharge Medication List    START taking these medications   Details  acetaminophen (TYLENOL) 325 MG tablet Take 2 tablets (650 mg total) by mouth every 4 (four) hours as needed. Qty: 30 tablet    clopidogrel (PLAVIX) 75 MG tablet Take 1 tablet (75 mg total) by mouth daily with breakfast. Qty: 30 tablet, Refills: 5    nitroGLYCERIN (NITROSTAT) 0.4 MG SL tablet Place 1 tablet (0.4 mg total) under the tongue every 5 (five) minutes as needed for chest pain. Qty: 25 tablet, Refills: 2    pantoprazole (PROTONIX) 40 MG tablet Take 1 tablet (40 mg total) by mouth daily at 12 noon. Qty: 30 tablet, Refills: 5      CONTINUE these medications which have NOT CHANGED   Details  allopurinol (ZYLOPRIM) 100 MG tablet Take 100 mg by mouth daily.      amLODipine (NORVASC) 10 MG tablet Take 10 mg by mouth daily.      aspirin 81 MG tablet Take 81 mg by mouth daily.      colesevelam (WELCHOL) 625 MG tablet Take 3,750 mg by mouth daily.      ezetimibe (ZETIA) 10 MG tablet Take 10 mg by mouth daily.      glimepiride (AMARYL) 2 MG tablet Take 2 mg by mouth daily before breakfast.      levothyroxine (SYNTHROID, LEVOTHROID) 100 MCG tablet Take 100 mcg by mouth daily.      LORazepam (ATIVAN) 0.5 MG tablet Take 0.5 mg by mouth every 8 (eight) hours.      metoprolol (LOPRESSOR) 50 MG tablet Take 50 mg by mouth 2 (two) times daily.      sodium bicarbonate 650 MG tablet Take 650 mg by mouth daily.        STOP taking these medications     benazepril (LOTENSIN) 20 MG tablet      furosemide (LASIX) 40 MG tablet      olmesartan (BENICAR)  40 MG tablet         Duration of Discharge Encounter: Greater than 30 minutes including physician time.  Signed, [name] PA-C 06/01/2011 11:50 AM  I did not see Mrs. Fine following my initial encounter when I performed her catheterization and PTCA.  Thankfully, she only had minimal Troponin elevation and remained stable throughout her hospitalization.  After multiple long conversations with D. Cornelius Moras from [facility] and my partners, the final decision was to proceed with aggressive medical therapy and not  go for CT Surgery.  We also determined that with a satisfactory PTCA result, there is no clear indication to go back to stent the RCA, unless symptoms recur.  She was seen today by Dr. Royann Shivers, who felt that she was stable for discharge with home PT.  Please refer to the final progress note.   I agree with Mr. Wynelle Link summary note.   HARDING,DAVID W 06/01/2011, 10:43 PM

## 2011-07-12 DIAGNOSIS — I480 Paroxysmal atrial fibrillation: Secondary | ICD-10-CM

## 2011-07-12 HISTORY — DX: Paroxysmal atrial fibrillation: I48.0

## 2011-07-28 ENCOUNTER — Other Ambulatory Visit: Payer: Self-pay

## 2011-07-28 ENCOUNTER — Emergency Department (HOSPITAL_COMMUNITY): Payer: Medicare Other

## 2011-07-28 ENCOUNTER — Encounter (HOSPITAL_COMMUNITY): Payer: Self-pay

## 2011-07-28 ENCOUNTER — Emergency Department (HOSPITAL_COMMUNITY)
Admission: EM | Admit: 2011-07-28 | Discharge: 2011-07-28 | Disposition: A | Discharge status: Home / Self Care | Payer: Medicare Other | Attending: Emergency Medicine | Admitting: Emergency Medicine

## 2011-07-28 DIAGNOSIS — E039 Hypothyroidism, unspecified: Secondary | ICD-10-CM | POA: Insufficient documentation

## 2011-07-28 DIAGNOSIS — R609 Edema, unspecified: Secondary | ICD-10-CM | POA: Insufficient documentation

## 2011-07-28 DIAGNOSIS — R42 Dizziness and giddiness: Secondary | ICD-10-CM

## 2011-07-28 DIAGNOSIS — Z79899 Other long term (current) drug therapy: Secondary | ICD-10-CM | POA: Insufficient documentation

## 2011-07-28 DIAGNOSIS — Z7982 Long term (current) use of aspirin: Secondary | ICD-10-CM | POA: Insufficient documentation

## 2011-07-28 DIAGNOSIS — E119 Type 2 diabetes mellitus without complications: Secondary | ICD-10-CM | POA: Insufficient documentation

## 2011-07-28 DIAGNOSIS — R0602 Shortness of breath: Secondary | ICD-10-CM | POA: Insufficient documentation

## 2011-07-28 DIAGNOSIS — I129 Hypertensive chronic kidney disease with stage 1 through stage 4 chronic kidney disease, or unspecified chronic kidney disease: Secondary | ICD-10-CM | POA: Insufficient documentation

## 2011-07-28 DIAGNOSIS — N189 Chronic kidney disease, unspecified: Secondary | ICD-10-CM | POA: Insufficient documentation

## 2011-07-28 DIAGNOSIS — F411 Generalized anxiety disorder: Secondary | ICD-10-CM | POA: Insufficient documentation

## 2011-07-28 DIAGNOSIS — I252 Old myocardial infarction: Secondary | ICD-10-CM | POA: Insufficient documentation

## 2011-07-28 LAB — DIFFERENTIAL
Basophils Absolute: 0.1 10*3/uL (ref 0.0–0.1)
Basophils Relative: 1 % (ref 0–1)
Eosinophils Absolute: 0.4 10*3/uL (ref 0.0–0.7)
Eosinophils Relative: 6 % — ABNORMAL HIGH (ref 0–5)
Lymphocytes Relative: 24 % (ref 12–46)
Lymphs Abs: 1.5 10*3/uL (ref 0.7–4.0)
Monocytes Absolute: 0.5 10*3/uL (ref 0.1–1.0)
Monocytes Relative: 8 % (ref 3–12)
Neutro Abs: 3.9 10*3/uL (ref 1.7–7.7)
Neutrophils Relative %: 61 % (ref 43–77)

## 2011-07-28 LAB — CBC
HCT: 41.6 % (ref 36.0–46.0)
Hemoglobin: 13.3 g/dL (ref 12.0–15.0)
MCH: 26.4 pg (ref 26.0–34.0)
MCHC: 32 g/dL (ref 30.0–36.0)
MCV: 82.5 fL (ref 78.0–100.0)
Platelets: 221 10*3/uL (ref 150–400)
RBC: 5.04 MIL/uL (ref 3.87–5.11)
RDW: 15.6 % — ABNORMAL HIGH (ref 11.5–15.5)
WBC: 6.4 10*3/uL (ref 4.0–10.5)

## 2011-07-28 LAB — URINALYSIS, ROUTINE W REFLEX MICROSCOPIC
Bilirubin Urine: NEGATIVE
Glucose, UA: NEGATIVE mg/dL
Hgb urine dipstick: NEGATIVE
Ketones, ur: NEGATIVE mg/dL
Leukocytes, UA: NEGATIVE
Nitrite: NEGATIVE
Protein, ur: NEGATIVE mg/dL
Specific Gravity, Urine: 1.007 (ref 1.005–1.030)
Urobilinogen, UA: 0.2 mg/dL (ref 0.0–1.0)
pH: 5 (ref 5.0–8.0)

## 2011-07-28 LAB — CARDIAC PANEL(CRET KIN+CKTOT+MB+TROPI)
CK, MB: 5.1 ng/mL — ABNORMAL HIGH (ref 0.3–4.0)
Relative Index: INVALID (ref 0.0–2.5)
Total CK: 95 U/L (ref 7–177)
Troponin I: <0.3 ng/mL (ref <0.30)

## 2011-07-28 LAB — BASIC METABOLIC PANEL
BUN: 25 mg/dL — ABNORMAL HIGH (ref 6–23)
CO2: 20 mEq/L (ref 19–32)
Calcium: 10.3 mg/dL (ref 8.4–10.5)
Creatinine, Ser: 1.45 mg/dL — ABNORMAL HIGH (ref 0.50–1.10)
GFR calc non Af Amer: 31 mL/min — ABNORMAL LOW (ref >90)
Glucose, Bld: 168 mg/dL — ABNORMAL HIGH (ref 70–99)
Sodium: 143 mEq/L (ref 135–145)

## 2011-07-28 MED ORDER — SODIUM CHLORIDE 0.9 % IV BOLUS (SEPSIS)
500.0000 mL | Freq: Once | INTRAVENOUS | Status: AC
  Administered 2011-07-28: 500 mL via INTRAVENOUS

## 2011-07-28 NOTE — ED Provider Notes (Signed)
History     CSN: [id number]  Arrival date & time 07/28/11  1156   First MD Initiated Contact with Patient 07/28/11 1200      Chief Complaint  Patient presents with  . Shortness of Breath     Patient is a 78 y.o. female presenting with shortness of breath. The history is provided by the patient.  Shortness of Breath     78 year old AA female with an extensive PMH presents to ED with dizziness that began this morning. Patient reports that she had 2 near-syncope events ("dizzy spells") between 0700 and 0800 at home while sitting. She denies LOC or fall. She denies any alleviating or aggravating factors. She denies chest pain, fever, palpitations, nausea, abdominal pain, fever, headache, changes in vision, numbness, weakness, paresthesias, changes in bowel habits, urinary symptoms, recent URI symptoms, and sick contacts. She reports chronic lower leg swelling. She was here in November for MI and stent placement.   Past Medical History  Diagnosis Date  . Diabetes mellitus   . Hypertension   . PONV (postoperative nausea and vomiting)   . Angina   . Shortness of breath   . Chronic kidney disease   . Hypothyroidism   . Headache   . Arthritis   . Anxiety   . Myocardial infarction   . Pneumonia     Past Surgical History  Procedure Date  . Joint replacement     both hips replaced  . Cardiac catheterization     Family History  Problem Relation Age of Onset  . Breast cancer Mother   . Heart disease Father     History  Substance Use Topics  . Smoking status: Never Smoker   . Smokeless tobacco: Not on file  . Alcohol Use: 0.6 oz/week    1 Glasses of wine per week    OB History    Grav Para Term Preterm Abortions TAB SAB Ect Mult Living                  Review of Systems  Neurological: Positive for dizziness.   All pertinent positives and negatives reviewed in the history of present illness   Allergies  Statins  Home Medications   Current Outpatient Rx  Name  Route Sig Dispense Refill  . ALLOPURINOL 100 MG PO TABS Oral Take 100 mg by mouth daily.      Marland Kitchen AMLODIPINE BESYLATE 10 MG PO TABS Oral Take 10 mg by mouth daily.      . ASPIRIN 81 MG PO TABS Oral Take 81 mg by mouth daily.      Marland Kitchen CLOPIDOGREL BISULFATE 75 MG PO TABS Oral Take 1 tablet (75 mg total) by mouth daily with breakfast. 30 tablet 5  . COLESEVELAM HCL 625 MG PO TABS Oral Take 1,875 mg by mouth 2 (two) times daily with a meal.     . EZETIMIBE 10 MG PO TABS Oral Take 10 mg by mouth daily.      . FUROSEMIDE 40 MG PO TABS Oral Take 80 mg by mouth daily.    Marland Kitchen GLIMEPIRIDE 2 MG PO TABS Oral Take 2 mg by mouth daily before breakfast.      . LEVOTHYROXINE SODIUM 100 MCG PO TABS Oral Take 100 mcg by mouth daily.      Marland Kitchen LORAZEPAM 0.5 MG PO TABS Oral Take 0.5 mg by mouth every 8 (eight) hours.      Marland Kitchen METOPROLOL TARTRATE 50 MG PO TABS Oral Take 50 mg  by mouth daily.     Marland Kitchen PANTOPRAZOLE SODIUM 40 MG PO TBEC Oral Take 1 tablet (40 mg total) by mouth daily at 12 noon. 30 tablet 5  . SODIUM BICARBONATE 650 MG PO TABS Oral Take 650 mg by mouth daily.      Marland Kitchen NITROGLYCERIN 0.4 MG SL SUBL Sublingual Place 1 tablet (0.4 mg total) under the tongue every 5 (five) minutes as needed for chest pain. 25 tablet 2    BP 164/58  Temp(Src) 97.9 F (36.6 C) (Oral)  Resp 18  SpO2 97%  Physical Exam  Nursing note and vitals reviewed. Constitutional: She is oriented to person, place, and time. She appears well-developed and well-nourished. No distress.  HENT:  Head: Normocephalic and atraumatic.  Eyes: Conjunctivae are normal. Pupils are equal, round, and reactive to light.  Neck: Normal range of motion. Neck supple.  Cardiovascular: Normal rate, regular rhythm and normal heart sounds.  Exam reveals decreased pulses.   Pulses:      Dorsalis pedis pulses are 1+ on the right side, and 1+ on the left side.       Posterior tibial pulses are 1+ on the right side, and 1+ on the left side.       2+ pitting edema  present bilaterally  Pulmonary/Chest: Effort normal. No respiratory distress. She has no wheezes. She exhibits no tenderness.  Abdominal: Soft. Bowel sounds are normal. She exhibits no distension. There is no tenderness.  Neurological: She is alert and oriented to person, place, and time. Coordination normal.  Skin: Skin is warm and dry. She is not diaphoretic.  Psychiatric: She has a normal mood and affect. Her behavior is normal.    ED Course  Procedures (including critical care time)  Results for orders placed during the hospital encounter of 07/28/11  CBC      Component Value Range   WBC 6.4  4.0 - 10.5 (K/uL)   RBC 5.04  3.87 - 5.11 (MIL/uL)   Hemoglobin 13.3  12.0 - 15.0 (g/dL)   HCT 16.1  09.6 - 04.5 (%)   MCV 82.5  78.0 - 100.0 (fL)   MCH 26.4  26.0 - 34.0 (pg)   MCHC 32.0  30.0 - 36.0 (g/dL)   RDW 40.9 (*) 81.1 - 15.5 (%)   Platelets 221  150 - 400 (K/uL)  DIFFERENTIAL      Component Value Range   Neutrophils Relative 61  43 - 77 (%)   Neutro Abs 3.9  1.7 - 7.7 (K/uL)   Lymphocytes Relative 24  12 - 46 (%)   Lymphs Abs 1.5  0.7 - 4.0 (K/uL)   Monocytes Relative 8  3 - 12 (%)   Monocytes Absolute 0.5  0.1 - 1.0 (K/uL)   Eosinophils Relative 6 (*) 0 - 5 (%)   Eosinophils Absolute 0.4  0.0 - 0.7 (K/uL)   Basophils Relative 1  0 - 1 (%)   Basophils Absolute 0.1  0.0 - 0.1 (K/uL)  URINALYSIS, ROUTINE W REFLEX MICROSCOPIC      Component Value Range   Color, Urine YELLOW  YELLOW    APPearance CLEAR  CLEAR    Specific Gravity, Urine 1.007  1.005 - 1.030    pH 5.0  5.0 - 8.0    Glucose, UA NEGATIVE  NEGATIVE (mg/dL)   Hgb urine dipstick NEGATIVE  NEGATIVE    Bilirubin Urine NEGATIVE  NEGATIVE    Ketones, ur NEGATIVE  NEGATIVE (mg/dL)   Protein, ur NEGATIVE  NEGATIVE (  mg/dL)   Urobilinogen, UA 0.2  0.0 - 1.0 (mg/dL)   Nitrite NEGATIVE  NEGATIVE    Leukocytes, UA NEGATIVE  NEGATIVE   CARDIAC PANEL(CRET KIN+CKTOT+MB+TROPI)      Component Value Range   Total CK 95  7 -  177 (U/L)   CK, MB 5.1 (*) 0.3 - 4.0 (ng/mL)   Troponin I <0.30  <0.30 (ng/mL)   Relative Index RELATIVE INDEX IS INVALID  0.0 - 2.5   BASIC METABOLIC PANEL      Component Value Range   Sodium 143  135 - 145 (mEq/L)   Potassium 4.0  3.5 - 5.1 (mEq/L)   Chloride 109  96 - 112 (mEq/L)   CO2 20  19 - 32 (mEq/L)   Glucose, Bld 168 (*) 70 - 99 (mg/dL)   BUN 25 (*) 6 - 23 (mg/dL)   Creatinine, Ser 9.62 (*) 0.50 - 1.10 (mg/dL)   Calcium 95.2  8.4 - 10.5 (mg/dL)   GFR calc non Af Amer 31 (*) >90 (mL/min)   GFR calc Af Amer 36 (*) >90 (mL/min)   Patient did not have syncope.  She has chronic findings on her lab testing here today.  But nothing that shows an acute process.  She is feeling much better and has not had any symptoms while here.  She has been up to the bathroom and had no difficulty.  She has some baseline shortness of breath that she says is no worsening in that.  We will have the patient ambulate and see how she does one more time.  Patient would like to go home.  Follow up with her primary care Dr. the patient is stable here in the family feels comfortable with her going home as well.  Patient did denies shortness of breath of note as mentioned by the nurse in her assessment.    4:09 PM The the patient was able to walk and ambulate without issue again.  She was sent home and advised she will need to followup with her primary care doctor tomorrow for recheck.  She is to return here for any worsening in her condition.    MDM    Date: 07/28/2011  Rate: 92  Rhythm: normal sinus rhythm  QRS Axis: normal  Intervals: normal and PR prolonged  ST/T Wave abnormalities: nonspecific T wave changes  Conduction Disutrbances:first-degree A-V block   Narrative Interpretation:   Old EKG Reviewed: unchanged  MDM Reviewed: nursing note and vitals Reviewed previous: labs, ECG, x-ray and CT scan Interpretation: labs, ECG and x-ray     Seems unlikely the patient is having cardiac symptoms  based on her presentation here today.  She states this is not like when she had her previous cardiac symptoms.  She did have some brief episodes of dizziness this morning but has since cleared and had no other symptoms.  She has not had shortness of breath as mentioned in the nursing notes.  She states that her anxiety was very high this morning when this occurred.  Patient does not seem to be having any symptoms or signs of PE at this time.  Carlyle Dolly, PA-C 07/28/11 1552  Carlyle Dolly, PA-C 07/28/11 1610  Carlyle Dolly, PA-C 07/28/11 [phone]

## 2011-07-28 NOTE — ED Notes (Signed)
Pt reports she is feeling better. NSR noted on monitor. VSS. Denies shortness of breath. Resp are unlabored. Skin warm and dry. NS bolus continues to infuse into benign iv site. Denies needs or complaints. Family at bedside and aware of plan of care.

## 2011-07-28 NOTE — ED Notes (Signed)
Pt reports dizzy spells and SOB beginning last night and was worse this am, very anxious  sats 98% on room air

## 2011-07-28 NOTE — ED Notes (Signed)
Ambulated patient to monitor pulse ox values.  Patient walked to bathroom and back.  Pulse ox was 96% at rest and dropped to 90% while walking.

## 2011-07-28 NOTE — ED Notes (Signed)
Patient transported to X-ray 

## 2011-07-28 NOTE — ED Notes (Signed)
Patient is unable to void at this time 

## 2011-07-29 NOTE — ED Provider Notes (Signed)
Medical screening examination/treatment/procedure(s) were conducted as a shared visit with non-physician practitioner(s) and myself.  I personally evaluated the patient during the encounter.  Patient seen and evaluated along with C. Lawyer.  I agree with his note, assessment, and plan.  Patient presented complaining of two near-syncopal episodes in the past few days.  The physical exam was essentially unremarkable, including a normal heart and lung exam.  A workup was initiated and failed to reveal a cause as well.  After discussion with the patient and family, she did not want to stay in the hospital and preferred to be discharged.  I felt this was a reasonable plan and will advise her to return if she worsens and follow up with her doctor in the next week.  Geoffery Lyons, MD 07/29/11 [phone]

## 2011-07-30 ENCOUNTER — Encounter (HOSPITAL_COMMUNITY): Payer: Self-pay | Admitting: *Deleted

## 2011-07-30 ENCOUNTER — Other Ambulatory Visit: Payer: Self-pay

## 2011-07-30 ENCOUNTER — Emergency Department (HOSPITAL_COMMUNITY): Payer: Medicare Other

## 2011-07-30 ENCOUNTER — Inpatient Hospital Stay (HOSPITAL_COMMUNITY)
Admission: EM | Admit: 2011-07-30 | Discharge: 2011-08-15 | DRG: 236 | Disposition: A | Discharge status: Home Health | Payer: Medicare Other | Attending: Cardiothoracic Surgery | Admitting: Cardiothoracic Surgery

## 2011-07-30 DIAGNOSIS — I4891 Unspecified atrial fibrillation: Secondary | ICD-10-CM | POA: Diagnosis not present

## 2011-07-30 DIAGNOSIS — E1129 Type 2 diabetes mellitus with other diabetic kidney complication: Secondary | ICD-10-CM | POA: Diagnosis present

## 2011-07-30 DIAGNOSIS — Y9210 Unspecified residential institution as the place of occurrence of the external cause: Secondary | ICD-10-CM | POA: Diagnosis not present

## 2011-07-30 DIAGNOSIS — D62 Acute posthemorrhagic anemia: Secondary | ICD-10-CM | POA: Diagnosis not present

## 2011-07-30 DIAGNOSIS — I252 Old myocardial infarction: Secondary | ICD-10-CM

## 2011-07-30 DIAGNOSIS — R000 Tachycardia, unspecified: Secondary | ICD-10-CM | POA: Diagnosis present

## 2011-07-30 DIAGNOSIS — K5900 Constipation, unspecified: Secondary | ICD-10-CM | POA: Diagnosis not present

## 2011-07-30 DIAGNOSIS — Y832 Surgical operation with anastomosis, bypass or graft as the cause of abnormal reaction of the patient, or of later complication, without mention of misadventure at the time of the procedure: Secondary | ICD-10-CM | POA: Diagnosis not present

## 2011-07-30 DIAGNOSIS — Z23 Encounter for immunization: Secondary | ICD-10-CM

## 2011-07-30 DIAGNOSIS — N189 Chronic kidney disease, unspecified: Secondary | ICD-10-CM | POA: Diagnosis present

## 2011-07-30 DIAGNOSIS — Z9861 Coronary angioplasty status: Secondary | ICD-10-CM

## 2011-07-30 DIAGNOSIS — I480 Paroxysmal atrial fibrillation: Secondary | ICD-10-CM | POA: Diagnosis not present

## 2011-07-30 DIAGNOSIS — I200 Unstable angina: Secondary | ICD-10-CM | POA: Diagnosis present

## 2011-07-30 DIAGNOSIS — I129 Hypertensive chronic kidney disease with stage 1 through stage 4 chronic kidney disease, or unspecified chronic kidney disease: Secondary | ICD-10-CM | POA: Diagnosis present

## 2011-07-30 DIAGNOSIS — I2510 Atherosclerotic heart disease of native coronary artery without angina pectoris: Principal | ICD-10-CM | POA: Diagnosis present

## 2011-07-30 DIAGNOSIS — E119 Type 2 diabetes mellitus without complications: Secondary | ICD-10-CM | POA: Diagnosis present

## 2011-07-30 DIAGNOSIS — R5381 Other malaise: Secondary | ICD-10-CM | POA: Diagnosis present

## 2011-07-30 DIAGNOSIS — E785 Hyperlipidemia, unspecified: Secondary | ICD-10-CM | POA: Diagnosis present

## 2011-07-30 DIAGNOSIS — I519 Heart disease, unspecified: Secondary | ICD-10-CM | POA: Diagnosis not present

## 2011-07-30 DIAGNOSIS — E8779 Other fluid overload: Secondary | ICD-10-CM | POA: Diagnosis not present

## 2011-07-30 DIAGNOSIS — F411 Generalized anxiety disorder: Secondary | ICD-10-CM | POA: Diagnosis present

## 2011-07-30 DIAGNOSIS — Z803 Family history of malignant neoplasm of breast: Secondary | ICD-10-CM

## 2011-07-30 DIAGNOSIS — R42 Dizziness and giddiness: Secondary | ICD-10-CM | POA: Diagnosis present

## 2011-07-30 DIAGNOSIS — Z96649 Presence of unspecified artificial hip joint: Secondary | ICD-10-CM

## 2011-07-30 DIAGNOSIS — E039 Hypothyroidism, unspecified: Secondary | ICD-10-CM | POA: Diagnosis present

## 2011-07-30 DIAGNOSIS — Z79899 Other long term (current) drug therapy: Secondary | ICD-10-CM

## 2011-07-30 DIAGNOSIS — Z8249 Family history of ischemic heart disease and other diseases of the circulatory system: Secondary | ICD-10-CM

## 2011-07-30 DIAGNOSIS — Z951 Presence of aortocoronary bypass graft: Secondary | ICD-10-CM

## 2011-07-30 DIAGNOSIS — Z7982 Long term (current) use of aspirin: Secondary | ICD-10-CM

## 2011-07-30 DIAGNOSIS — N289 Disorder of kidney and ureter, unspecified: Secondary | ICD-10-CM | POA: Diagnosis not present

## 2011-07-30 LAB — CBC
HCT: 41.7 % (ref 36.0–46.0)
MCH: 26.6 pg (ref 26.0–34.0)
MCHC: 32.4 g/dL (ref 30.0–36.0)
MCV: 82.2 fL (ref 78.0–100.0)
Platelets: 210 10*3/uL (ref 150–400)
RDW: 15.6 % — ABNORMAL HIGH (ref 11.5–15.5)

## 2011-07-30 LAB — COMPREHENSIVE METABOLIC PANEL
AST: 39 U/L — ABNORMAL HIGH (ref 0–37)
Albumin: 3.6 g/dL (ref 3.5–5.2)
BUN: 21 mg/dL (ref 6–23)
Calcium: 10.4 mg/dL (ref 8.4–10.5)
Creatinine, Ser: 1.4 mg/dL — ABNORMAL HIGH (ref 0.50–1.10)
Total Bilirubin: 0.3 mg/dL (ref 0.3–1.2)

## 2011-07-30 LAB — APTT: aPTT: 32 seconds (ref 24–37)

## 2011-07-30 LAB — CARDIAC PANEL(CRET KIN+CKTOT+MB+TROPI)
CK, MB: 4.7 ng/mL — ABNORMAL HIGH (ref 0.3–4.0)
Relative Index: 4.5 — ABNORMAL HIGH (ref 0.0–2.5)
Total CK: 105 U/L (ref 7–177)

## 2011-07-30 LAB — URINALYSIS, ROUTINE W REFLEX MICROSCOPIC
Glucose, UA: NEGATIVE mg/dL
Hgb urine dipstick: NEGATIVE
Protein, ur: NEGATIVE mg/dL
Specific Gravity, Urine: 1.007 (ref 1.005–1.030)
Urobilinogen, UA: 0.2 mg/dL (ref 0.0–1.0)

## 2011-07-30 LAB — PROTIME-INR
INR: 1.01 (ref 0.00–1.49)
Prothrombin Time: 13.5 seconds (ref 11.6–15.2)

## 2011-07-30 LAB — URINE MICROSCOPIC-ADD ON

## 2011-07-30 LAB — MAGNESIUM: Magnesium: 1.9 mg/dL (ref 1.5–2.5)

## 2011-07-30 LAB — POCT I-STAT TROPONIN I: Troponin i, poc: 0.01 ng/mL (ref 0.00–0.08)

## 2011-07-30 LAB — D-DIMER, QUANTITATIVE: D-Dimer, Quant: 0.65 ug/mL-FEU — ABNORMAL HIGH (ref 0.00–0.48)

## 2011-07-30 LAB — GLUCOSE, CAPILLARY: Glucose-Capillary: 125 mg/dL — ABNORMAL HIGH (ref 70–99)

## 2011-07-30 MED ORDER — NITROGLYCERIN 0.4 MG SL SUBL
0.4000 mg | SUBLINGUAL_TABLET | SUBLINGUAL | Status: DC | PRN
Start: 2011-07-30 — End: 2011-08-05

## 2011-07-30 MED ORDER — COLESEVELAM HCL 625 MG PO TABS
1875.0000 mg | ORAL_TABLET | Freq: Two times a day (BID) | ORAL | Status: DC
  Administered 2011-07-31 – 2011-08-15 (×28): 1875 mg via ORAL
  Filled 2011-07-30 (×33): qty 3

## 2011-07-30 MED ORDER — ASPIRIN 81 MG PO CHEW
81.0000 mg | CHEWABLE_TABLET | Freq: Every day | ORAL | Status: DC
Start: 2011-07-31 — End: 2011-08-05
  Administered 2011-07-31 – 2011-08-04 (×5): 81 mg via ORAL
  Filled 2011-07-30 (×6): qty 1

## 2011-07-30 MED ORDER — METOPROLOL TARTRATE 50 MG PO TABS
50.0000 mg | ORAL_TABLET | Freq: Two times a day (BID) | ORAL | Status: DC
  Administered 2011-07-30 – 2011-08-04 (×11): 50 mg via ORAL
  Filled 2011-07-30 (×13): qty 1

## 2011-07-30 MED ORDER — ACETAMINOPHEN 325 MG PO TABS
650.0000 mg | ORAL_TABLET | ORAL | Status: DC | PRN
  Filled 2011-07-30 (×4): qty 2

## 2011-07-30 MED ORDER — CLOPIDOGREL BISULFATE 75 MG PO TABS
75.0000 mg | ORAL_TABLET | Freq: Every day | ORAL | Status: DC
  Administered 2011-07-31: 75 mg via ORAL
  Filled 2011-07-30: qty 1

## 2011-07-30 MED ORDER — HEPARIN SOD (PORCINE) IN D5W 100 UNIT/ML IV SOLN
1100.0000 [IU]/h | INTRAVENOUS | Status: DC
  Administered 2011-07-30: 900 [IU]/h via INTRAVENOUS
  Administered 2011-07-31 (×2): 1100 [IU]/h via INTRAVENOUS
  Filled 2011-07-30 (×3): qty 250

## 2011-07-30 MED ORDER — AMLODIPINE BESYLATE 10 MG PO TABS
10.0000 mg | ORAL_TABLET | Freq: Every day | ORAL | Status: DC
  Administered 2011-07-30 – 2011-08-04 (×6): 10 mg via ORAL
  Filled 2011-07-30 (×7): qty 1

## 2011-07-30 MED ORDER — SODIUM CHLORIDE 0.9 % IJ SOLN
3.0000 mL | Freq: Two times a day (BID) | INTRAMUSCULAR | Status: DC
  Administered 2011-07-30 – 2011-08-04 (×7): 3 mL via INTRAVENOUS

## 2011-07-30 MED ORDER — ONDANSETRON HCL 4 MG/2ML IJ SOLN: 4.0000 mg | Freq: Four times a day (QID) | INTRAMUSCULAR | Status: DC | PRN

## 2011-07-30 MED ORDER — ASPIRIN 81 MG PO CHEW
324.0000 mg | CHEWABLE_TABLET | Freq: Once | ORAL | Status: AC
  Administered 2011-07-30: 243 mg via ORAL
  Filled 2011-07-30: qty 3

## 2011-07-30 MED ORDER — SODIUM BICARBONATE 650 MG PO TABS
650.0000 mg | ORAL_TABLET | Freq: Every day | ORAL | Status: DC
  Administered 2011-07-31 – 2011-08-04 (×5): 650 mg via ORAL
  Filled 2011-07-30 (×6): qty 1

## 2011-07-30 MED ORDER — ALLOPURINOL 100 MG PO TABS
100.0000 mg | ORAL_TABLET | Freq: Every day | ORAL | Status: DC
  Administered 2011-07-31 – 2011-08-15 (×14): 100 mg via ORAL
  Filled 2011-07-30 (×16): qty 1

## 2011-07-30 MED ORDER — NITROGLYCERIN 2 % TD OINT
1.0000 [in_us] | TOPICAL_OINTMENT | Freq: Three times a day (TID) | TRANSDERMAL | Status: DC
  Administered 2011-07-30 – 2011-08-01 (×6): 1 [in_us] via TOPICAL
  Filled 2011-07-30: qty 1
  Filled 2011-07-30: qty 30

## 2011-07-30 MED ORDER — HEPARIN BOLUS VIA INFUSION
3700.0000 [IU] | Freq: Once | INTRAVENOUS | Status: AC
  Administered 2011-07-30: 3700 [IU] via INTRAVENOUS
  Filled 2011-07-30: qty 3700

## 2011-07-30 MED ORDER — EZETIMIBE 10 MG PO TABS
10.0000 mg | ORAL_TABLET | Freq: Every day | ORAL | Status: DC
  Administered 2011-07-30 – 2011-08-15 (×15): 10 mg via ORAL
  Filled 2011-07-30 (×17): qty 1

## 2011-07-30 MED ORDER — GLIMEPIRIDE 2 MG PO TABS
2.0000 mg | ORAL_TABLET | Freq: Every day | ORAL | Status: DC
  Filled 2011-07-30 (×2): qty 1

## 2011-07-30 MED ORDER — ACETAMINOPHEN 325 MG PO TABS
650.0000 mg | ORAL_TABLET | Freq: Two times a day (BID) | ORAL | Status: DC
  Administered 2011-07-30 – 2011-08-04 (×11): 650 mg via ORAL
  Filled 2011-07-30 (×7): qty 2

## 2011-07-30 MED ORDER — LORAZEPAM 0.5 MG PO TABS
0.5000 mg | ORAL_TABLET | Freq: Three times a day (TID) | ORAL | Status: DC
  Administered 2011-07-30 – 2011-08-05 (×16): 0.5 mg via ORAL
  Filled 2011-07-30 (×16): qty 1

## 2011-07-30 MED ORDER — FUROSEMIDE 40 MG PO TABS
40.0000 mg | ORAL_TABLET | Freq: Two times a day (BID) | ORAL | Status: DC
  Administered 2011-07-30 – 2011-08-04 (×11): 40 mg via ORAL
  Filled 2011-07-30 (×15): qty 1

## 2011-07-30 MED ORDER — PANTOPRAZOLE SODIUM 40 MG PO TBEC
40.0000 mg | DELAYED_RELEASE_TABLET | Freq: Every day | ORAL | Status: DC
  Administered 2011-07-30 – 2011-08-04 (×6): 40 mg via ORAL
  Filled 2011-07-30 (×6): qty 1

## 2011-07-30 MED ORDER — NITROGLYCERIN 0.4 MG SL SUBL
0.4000 mg | SUBLINGUAL_TABLET | SUBLINGUAL | Status: AC | PRN
  Administered 2011-07-30 (×3): 0.4 mg via SUBLINGUAL
  Filled 2011-07-30: qty 25

## 2011-07-30 MED ORDER — LEVOTHYROXINE SODIUM 100 MCG PO TABS
100.0000 ug | ORAL_TABLET | Freq: Every day | ORAL | Status: DC
  Administered 2011-07-31 – 2011-08-15 (×15): 100 ug via ORAL
  Filled 2011-07-30 (×18): qty 1

## 2011-07-30 MED ORDER — SODIUM CHLORIDE 0.9 % IV SOLN
250.0000 mL | INTRAVENOUS | Status: DC | PRN
  Administered 2011-08-03: 10 mL via INTRAVENOUS

## 2011-07-30 MED ORDER — SODIUM CHLORIDE 0.9 % IJ SOLN: 3.0000 mL | INTRAMUSCULAR | Status: DC | PRN

## 2011-07-30 MED ORDER — ZOLPIDEM TARTRATE 5 MG PO TABS: 5.0000 mg | ORAL_TABLET | Freq: Every evening | ORAL | Status: DC | PRN

## 2011-07-30 MED ORDER — SODIUM CHLORIDE 0.9 % IV SOLN
INTRAVENOUS | Status: DC
  Administered 2011-07-30: 15:00:00 via INTRAVENOUS

## 2011-07-30 NOTE — Progress Notes (Signed)
ANTICOAGULATION CONSULT NOTE - Initial Consult  Pharmacy Consult for IV Heparin Indication: chest pain/ACS  Allergies  Allergen Reactions  . Statins Rash    Patient Measurements: Weight- 103kg from 06/01/11. Height- 157cm from 05/27/11. Heparin dosing weight: 74.4 kg  Vital Signs: Temp: 98 F (36.7 C) (01/19 1441) Temp src: Oral (01/19 1441) BP: 142/67 mmHg (01/19 1511) Pulse Rate: 92  (01/19 1511)  Labs:  Basename 07/30/11 1448 07/30/11 1152 07/28/11 1355 07/28/11 1350 07/28/11 1344  HGB -- 13.5 -- 13.3 --  HCT -- 41.7 -- 41.6 --  PLT -- 210 -- 221 --  APTT -- -- -- -- --  LABPROT -- -- -- -- --  INR -- -- -- -- --  HEPARINUNFRC -- -- -- -- --  CREATININE -- 1.40* -- -- 1.45*  CKTOTAL 105 -- 95 -- --  CKMB 4.7* -- 5.1* -- --  TROPONINI <0.30 -- <0.30 -- --   The CrCl is unknown because both a height and weight (above a minimum accepted value) are required for this calculation.  Medical History: Past Medical History  Diagnosis Date  . Diabetes mellitus   . Hypertension   . PONV (postoperative nausea and vomiting)   . Angina   . Shortness of breath   . Chronic kidney disease   . Hypothyroidism   . Headache   . Arthritis   . Anxiety   . Myocardial infarction   . Pneumonia   . Episodic lightheadedness, with near syncope X 3 07/30/2011  . Coronary artery disease     Medications:   (Not in a hospital admission) Scheduled:    . aspirin  324 mg Oral Once  . nitroGLYCERIN  1 inch Topical Q8H  . sodium chloride  3 mL Intravenous Q12H   Infusions:    . sodium chloride      Assessment: 78 year old female with known severe multiple vessel CAD and recent history of STEMI in November 2012 now admitted for chest pain. Patient was not on anticoagulation prior to admission. SCr 1.40. INR 1.18 from 05/30/11. CBC is wnl. CK-MD elevated, Troponin negative x2. D-dimer 0.65. Plan for CT surgery consult for possible CABG.   Goal of Therapy:  Heparin level 0.3-0.7  units/ml   Plan:  1. Heparin bolus of 3700 units x1, then heparin drip at 900 units/hr.  2. Daily heparin level and CBC.  3. Will check heparin level 8 hours after initiated.   Fayne Norrie 07/30/2011,4:40 PM

## 2011-07-30 NOTE — H&P (Signed)
[name] is an 78 y.o. female.    CARDIOLOGIST:  Dr. Bryan Lemma  Primary MD:Dr. Adela Lank  Chief Complaint: Lightheaded and lt. Sided chest pain. HPI: 78 year old African American female presents to the ER today with lightheadedness and left-sided chest pain. She has a history of severe coronary artery disease with 80% -90% left main stenosis and tubular 80-90% stenosis in the mid LAD.  Also RCA disease of 100% in the midportion that she underwent balloon angioplasty only 05/27/2011, when she presented with ST elevation MI of the inferior wall. The left main and LAD are treated medically.   Patient was evaluated by Dr. Cornelius Moras with [facility] for possible coronary artery bypass grafting. The patient decided, she did not wish to undergo surgery.  It was also discussed placing a stent in the RCA but due to the severity of the left main disease it was felt medical therapy was best option unless the patient had recurrent chest pain.  Patient has done quite well until the last week. She began having episodes of lightheadedness as if she would pass out. There was no room spinning only lightheadedness some darkness and some blurred vision. She was seen in the emergency room 07/28/2011 for the lightheadedness at that time she had no chest discomfort. It was felt the lightheadedness was not related to her heart and patient was discharged.  Today she presents with a lightheadedness as well as left anterior chest pressure discomfort and she has some right shoulder discomfort which she relates is her arthritis.  The chest pressure is not the same type of discomfort she had with her ST elevation MI. This is mild discomfort in the left anterior chest. Associated symptoms do include shortness of breath no diaphoresis and no nausea or vomiting. She also states around her eyes also feels swollen. Additionally at times she does feel as if her heart is racing but this is not associated with her lightheaded  feeling.  Currently she continues with a mild left anterior chest discomfort we will try nitroglycerin sublingual to see if this helps in any way. Cardiac markers are negative, an EKG was without acute changes. On 07/28/2011 her cardiac markers were also negative.    Past Medical History  Diagnosis Date  . Diabetes mellitus   . Hypertension   . PONV (postoperative nausea and vomiting)   . Angina   . Shortness of breath   . Chronic kidney disease   . Hypothyroidism   . Headache   . Arthritis   . Anxiety   . Myocardial infarction   . Pneumonia   . Episodic lightheadedness, with near syncope X 3 07/30/2011  . Coronary artery disease     Past Surgical History  Procedure Date  . Joint replacement     both hips replaced  . Cardiac catheterization   . Coronary angioplasty     Family History  Problem Relation Age of Onset  . Breast cancer Mother   . Heart disease Father    Social History:  reports that she has never smoked. She does not have any smokeless tobacco history on file. She reports that she drinks about .6 ounces of alcohol per week. Her drug history not on file. No illicit drug use.  Allergies:  Allergies  Allergen Reactions  . Statins Rash    Medications Prior to Admission  Medication Dose Route Frequency Provider Last Rate Last Dose  . sodium chloride 0.9 % injection 3 mL  3 mL Intravenous Q12H Meagan Hunt,  MD   3 mL at 07/30/11 1230   And  . sodium chloride 0.9 % injection 3 mL  3 mL Intravenous PRN Cyndra Numbers, MD       And  . 0.9 %  sodium chloride infusion  250 mL Intravenous PRN Cyndra Numbers, MD      . 0.9 %  sodium chloride infusion   Intravenous Continuous Leone Brand, NP      . aspirin chewable tablet 324 mg  324 mg Oral Once Cyndra Numbers, MD   243 mg at 07/30/11 1207  . nitroGLYCERIN (NITROSTAT) SL tablet 0.4 mg  0.4 mg Sublingual Q5 min PRN Cyndra Numbers, MD   0.4 mg at 07/30/11 1450   Medications Prior to Admission  Medication Sig Dispense  Refill  . allopurinol (ZYLOPRIM) 100 MG tablet Take 100 mg by mouth daily.       Marland Kitchen amLODipine (NORVASC) 10 MG tablet Take 10 mg by mouth daily.        Marland Kitchen aspirin 81 MG tablet Take 81 mg by mouth daily.        . clopidogrel (PLAVIX) 75 MG tablet Take 1 tablet (75 mg total) by mouth daily with breakfast.  30 tablet  5  . colesevelam (WELCHOL) 625 MG tablet Take 1,875 mg by mouth 2 (two) times daily with a meal.       . ezetimibe (ZETIA) 10 MG tablet Take 10 mg by mouth daily.        . furosemide (LASIX) 40 MG tablet Take 40 mg by mouth 2 (two) times daily.       Marland Kitchen glimepiride (AMARYL) 2 MG tablet Take 2 mg by mouth daily before breakfast.        . levothyroxine (SYNTHROID, LEVOTHROID) 100 MCG tablet Take 100 mcg by mouth daily.        Marland Kitchen LORazepam (ATIVAN) 0.5 MG tablet Take 0.5 mg by mouth every 8 (eight) hours.        . metoprolol (LOPRESSOR) 50 MG tablet Take 50 mg by mouth daily.       . pantoprazole (PROTONIX) 40 MG tablet Take 1 tablet (40 mg total) by mouth daily at 12 noon.  30 tablet  5  . sodium bicarbonate 650 MG tablet Take 650 mg by mouth daily.        . nitroGLYCERIN (NITROSTAT) 0.4 MG SL tablet Place 1 tablet (0.4 mg total) under the tongue every 5 (five) minutes as needed for chest pain.  25 tablet  2    Results for orders placed during the hospital encounter of 07/30/11 (from the past 48 hour(s))  PRO B NATRIURETIC PEPTIDE     Status: Normal   Collection Time   07/30/11 11:50 AM      Component Value Range Comment   Pro B Natriuretic peptide (BNP) 144.5  0 - 450 (pg/mL)   CBC     Status: Abnormal   Collection Time   07/30/11 11:52 AM      Component Value Range Comment   WBC 5.9  4.0 - 10.5 (K/uL)    RBC 5.07  3.87 - 5.11 (MIL/uL)    Hemoglobin 13.5  12.0 - 15.0 (g/dL)    HCT 11.9  14.7 - 82.9 (%)    MCV 82.2  78.0 - 100.0 (fL)    MCH 26.6  26.0 - 34.0 (pg)    MCHC 32.4  30.0 - 36.0 (g/dL)    RDW 56.2 (*) 13.0 - 15.5 (%)  Platelets 210  150 - 400 (K/uL)   COMPREHENSIVE  METABOLIC PANEL     Status: Abnormal   Collection Time   07/30/11 11:52 AM      Component Value Range Comment   Sodium 139  135 - 145 (mEq/L)    Potassium 3.9  3.5 - 5.1 (mEq/L)    Chloride 106  96 - 112 (mEq/L)    CO2 23  19 - 32 (mEq/L)    Glucose, Bld 142 (*) 70 - 99 (mg/dL)    BUN 21  6 - 23 (mg/dL)    Creatinine, Ser 1.61 (*) 0.50 - 1.10 (mg/dL)    Calcium 09.6  8.4 - 10.5 (mg/dL)    Total Protein 6.9  6.0 - 8.3 (g/dL)    Albumin 3.6  3.5 - 5.2 (g/dL)    AST 39 (*) 0 - 37 (U/L)    ALT 44 (*) 0 - 35 (U/L)    Alkaline Phosphatase 135 (*) 39 - 117 (U/L)    Total Bilirubin 0.3  0.3 - 1.2 (mg/dL)    GFR calc non Af Amer 32 (*) >90 (mL/min)    GFR calc Af Amer 38 (*) >90 (mL/min)   POCT I-STAT TROPONIN I     Status: Normal   Collection Time   07/30/11 12:05 PM      Component Value Range Comment   Troponin i, poc 0.01  0.00 - 0.08 (ng/mL)    Comment 3            URINALYSIS, ROUTINE W REFLEX MICROSCOPIC     Status: Abnormal   Collection Time   07/30/11 12:22 PM      Component Value Range Comment   Color, Urine YELLOW  YELLOW     APPearance CLEAR  CLEAR     Specific Gravity, Urine 1.007  1.005 - 1.030     pH 5.5  5.0 - 8.0     Glucose, UA NEGATIVE  NEGATIVE (mg/dL)    Hgb urine dipstick NEGATIVE  NEGATIVE     Bilirubin Urine NEGATIVE  NEGATIVE     Ketones, ur NEGATIVE  NEGATIVE (mg/dL)    Protein, ur NEGATIVE  NEGATIVE (mg/dL)    Urobilinogen, UA 0.2  0.0 - 1.0 (mg/dL)    Nitrite NEGATIVE  NEGATIVE     Leukocytes, UA SMALL (*) NEGATIVE    URINE MICROSCOPIC-ADD ON     Status: Normal   Collection Time   07/30/11 12:22 PM      Component Value Range Comment   Squamous Epithelial / LPF RARE  RARE     WBC, UA 3-6  <3 (WBC/hpf)    Dg Chest 2 View  07/30/2011  *RADIOLOGY REPORT*  Clinical Data: Shortness of breath with exertion, chest pain  CHEST - 2 VIEW  Comparison: 07/28/2011  Findings: Cardiomegaly.  No frank interstitial edema. No pleural effusion or pneumothorax.  Degenerative  changes of the visualized thoracolumbar spine.  IMPRESSION: No evidence of acute cardiopulmonary disease.  Cardiomegaly without frank interstitial edema.  Original Report Authenticated By: Charline Bills, M.D.    ROS: Gen.: No colds or fevers no weight changes. Skin: No rashes or ulcers. HEENT: Feels as if her eyes are swollen, occasional blurred vision.  Cardiovascular: See history of present illness. Pulmonary: Occasional shortness of breath.recently she has had some wheezes per her family Endocrine: She does not check her glucose at home but states that Dr. Juleen China has told her,her diabetes is stable. GI: No diarrhea constipation or melena. GU: No  dysuria or hematuria. Musculoskeletal: No current complaints of arthritis pain except for right shoulder. Neuro: No vertigo, but lightheadedness feeling as if she may pass out.  Blood pressure 128/52, pulse 90, temperature 98 F (36.7 C), temperature source Oral, resp. rate 18, SpO2 98.00%. PE: general: Alert oriented African American female in no acute distress pleasant affect. Skin: Warm and dry, brisk capillary refill. HEENT:normocephalic sclera clear, no edema of eyes. Neck: Supple, no JVD, no carotid bruits. Heart: S1-S2, regular rate and rhythm, no obvious murmur.occasional premature beat Lungs: Bilateral crackles in the bases no wheezes today. Abdomen: Obese soft nontender positive bowel sounds do not palpate liver spleen or masses. Extremities: 1+ pedal pulses bilaterally, trace edema of lower extremities. Neuro: Alert oriented x3, moves all extremities, follows commands.   Assessment/Plan Patient Active Problem List  Diagnoses  . CAD, multiple vessel, severe disease, no stents placed only angioplasty to mid RCA.   Marland Kitchen Hypertension, uncontrolled on arrival 07/30/11  . Dyslipidemia  . Hypothyroidism  . Renal insufficiency  . ST elevation (STEMI) myocardial infarction  . Diabetes type 2, controlled  . History of gout  . Drug  reaction  . Episodic lightheadedness, with near syncope X 3  . Racing heart beat, episodically  . Chest pain at rest, possible cardiac.   PLAN: Difficult situation with 78 year old with severe coronary artery disease and recent angioplasty of the RCA with an ST elevation MI in November of 2012.  Her symptoms could be related to ischemia.  She has declined to undergo coronary artery bypass grafting.  We will admit her to step down, monitor serial cardiac enzymes and EKG.  We'll adjust her medications perhaps with better blood pressure control we will be able to prevent anginal symptoms. Dr. Rennis Golden to see for further recommendations.   INGOLD,LAURA R 07/30/2011, 2:57 PM

## 2011-07-30 NOTE — ED Provider Notes (Signed)
History     CSN: [id number]  Arrival date & time 07/30/11  1130   First MD Initiated Contact with Patient 07/30/11 1142      Chief Complaint  Patient presents with  . Chest Pain  . Anxiety    (Consider location/radiation/quality/duration/timing/severity/associated sxs/prior treatment) HPI Comments: Patient presents with sharp left-sided chest pain onset this morning lasting a few minutes at a time. She has similar pain last night that lasted 2 minutes as well. Associated with shortness of breath, anxiety, nervousness and shakiness. She was seen in this ED 2 days ago for lightheadedness and presyncope and had negative cardiac workup at that point. Her history is significant for ST elevation MI in November with 2 stents placed. The pain she is having now is not like the pain she had with her heart attack. She denies any cough, fever, abdominal pain nausea or vomiting. She has nitroglycerin at home but has not been taking it.  The history is provided by the patient and the EMS personnel.    Past Medical History  Diagnosis Date  . Diabetes mellitus   . Hypertension   . PONV (postoperative nausea and vomiting)   . Angina   . Shortness of breath   . Chronic kidney disease   . Hypothyroidism   . Headache   . Arthritis   . Anxiety   . Myocardial infarction   . Pneumonia   . Episodic lightheadedness, with near syncope X 3 07/30/2011  . Coronary artery disease     Past Surgical History  Procedure Date  . Joint replacement     both hips replaced  . Cardiac catheterization   . Coronary angioplasty     Family History  Problem Relation Age of Onset  . Breast cancer Mother   . Heart disease Father     History  Substance Use Topics  . Smoking status: Never Smoker   . Smokeless tobacco: Not on file  . Alcohol Use: 0.6 oz/week    1 Glasses of wine per week    OB History    Grav Para Term Preterm Abortions TAB SAB Ect Mult Living                  Review of Systems    Constitutional: Positive for activity change, appetite change and fatigue. Negative for fever.  HENT: Negative for congestion and rhinorrhea.   Respiratory: Positive for chest tightness and shortness of breath. Negative for cough.   Cardiovascular: Positive for chest pain.  Gastrointestinal: Negative for nausea, vomiting and abdominal pain.  Genitourinary: Negative for dysuria, hematuria and menstrual problem.  Musculoskeletal: Negative for back pain.  Skin: Negative for rash.  Neurological: Positive for dizziness, weakness and light-headedness. Negative for headaches.    Allergies  Statins  Home Medications   No current outpatient prescriptions on file.  BP 124/89  Pulse 90  Temp(Src) 98 F (36.7 C) (Oral)  Resp 18  Ht 5' 1.81" (1.57 m)  Wt 227 lb 1.2 oz (103 kg)  BMI 41.79 kg/m2  SpO2 97%  Physical Exam  Constitutional: She is oriented to person, place, and time. She appears well-developed and well-nourished. No distress.       Anxious no distress  HENT:  Head: Normocephalic and atraumatic.  Mouth/Throat: Oropharynx is clear and moist. No oropharyngeal exudate.  Eyes: Conjunctivae are normal. Pupils are equal, round, and reactive to light.  Neck: Normal range of motion. Neck supple.  Cardiovascular: Normal rate, regular rhythm and normal heart sounds.  No murmur heard. Pulmonary/Chest: Effort normal and breath sounds normal. No respiratory distress. She exhibits no tenderness.       No rash on chest wall  Abdominal: Soft. There is no tenderness. There is no rebound and no guarding.  Musculoskeletal: Normal range of motion. She exhibits no edema and no tenderness.  Neurological: She is alert and oriented to person, place, and time. She displays normal reflexes. No cranial nerve deficit.  Skin: Skin is warm.    ED Course  Procedures (including critical care time)  Labs Reviewed  CBC - Abnormal; Notable for the following:    RDW 15.6 (*)    All other components  within normal limits  COMPREHENSIVE METABOLIC PANEL - Abnormal; Notable for the following:    Glucose, Bld 142 (*)    Creatinine, Ser 1.40 (*)    AST 39 (*)    ALT 44 (*)    Alkaline Phosphatase 135 (*)    GFR calc non Af Amer 32 (*)    GFR calc Af Amer 38 (*)    All other components within normal limits  URINALYSIS, ROUTINE W REFLEX MICROSCOPIC - Abnormal; Notable for the following:    Leukocytes, UA SMALL (*)    All other components within normal limits  D-DIMER, QUANTITATIVE - Abnormal; Notable for the following:    D-Dimer, Quant 0.65 (*)    All other components within normal limits  CARDIAC PANEL(CRET KIN+CKTOT+MB+TROPI) - Abnormal; Notable for the following:    CK, MB 4.7 (*)    Relative Index 4.5 (*)    All other components within normal limits  PRO B NATRIURETIC PEPTIDE  POCT I-STAT TROPONIN I  URINE MICROSCOPIC-ADD ON  I-STAT TROPONIN I  HEPARIN LEVEL (UNFRACTIONATED)   Dg Chest 2 View  07/30/2011  *RADIOLOGY REPORT*  Clinical Data: Shortness of breath with exertion, chest pain  CHEST - 2 VIEW  Comparison: 07/28/2011  Findings: Cardiomegaly.  No frank interstitial edema. No pleural effusion or pneumothorax.  Degenerative changes of the visualized thoracolumbar spine.  IMPRESSION: No evidence of acute cardiopulmonary disease.  Cardiomegaly without frank interstitial edema.  Original Report Authenticated By: Charline Bills, M.D.     No diagnosis found.    MDM  Intermittent chest pain, lightheadedness, shortness of breath and dizziness. History of CAD with stent placement 2 months ago.  Severe CAD, patient previously considered for CABG but declined.  Increased DOE, chest pain, SOB, dizziness.  Seen two days ago for dizziness.  No chest pain then.  Concern for unstable angina, recurrent angina in setting of known CAD. D/w Dr. Rennis Golden who will admit.   Date: 07/30/2011  Rate: 85  Rhythm: normal sinus rhythm  QRS Axis: normal  Intervals: normal  ST/T Wave  abnormalities: normal  Conduction Disutrbances:none  Narrative Interpretation: Septal Q waves unchanged  Old EKG Reviewed: unchanged  CRITICAL CARE Performed by: Glynn Octave   Total critical care time: 30  Critical care time was exclusive of separately billable procedures and treating other patients.  Critical care was necessary to treat or prevent imminent or life-threatening deterioration.  Critical care was time spent personally by me on the following activities: development of treatment plan with patient and/or surrogate as well as nursing, discussions with consultants, evaluation of patient's response to treatment, examination of patient, obtaining history from patient or surrogate, ordering and performing treatments and interventions, ordering and review of laboratory studies, ordering and review of radiographic studies, pulse oximetry and re-evaluation of patient's condition.  Glynn Octave, MD 07/30/11 [phone]

## 2011-07-30 NOTE — ED Notes (Signed)
Pt arrived by [facility], having left side chest pain that increases with movement and mild sob. Pt very nervous and shaky. Reports recent high stress levels. Was seen here on Thursday for same and told that it was anxiety. bp 204/98 pta.

## 2011-07-30 NOTE — H&P (Signed)
Pt. Seen and examined. Agree with the NP/PA-C note as written. Well known patient to our service with recent inferior STEMI s/p POBA.  She also has severe concomitant 80-90% LM and mid-LAD stenosis.  Please refer to earlier CT surgery notes for their evaluation.  Ultimately, she did not undergo CABG and it was felt that since she had a good result to the RCA with POBA, that further stenting was not necessary at that time.  She now returns with intermittent chest pain as well as dizziness. In fact, she was in the ER 2 days ago for dizziness, but no chest pain at that time.  She feels more fatigued and gets more easily short of breath.  Discussion with the patient and the family today seems to indicated that knowing the risks of surgery, she would be willing to proceed with CABG.  I would like admit her and treat for ACS. I will re-consult CT surgery for their recommendations.  There is some question of possibly "incomplete" left system pictures and a repeat cath may be necessary.  I will discuss this with Dr. Herbie Baltimore, her primary cardiologist.  Chrystie Nose, MD Attending Cardiologist [facility]

## 2011-07-30 NOTE — ED Provider Notes (Signed)
78 year old female presents by EMS today for evaluation of continued left-sided chest pain. BP at EMS arrival was 204/98. Patient was last seen here on January 17. At that time patient had negative evaluation for chest pain and shortness of breath. She presented with 2 episodes of presyncope. Note indicated that the patient elected to be discharged home the patient and family state that they didn't realize admission was an option. Patient had cardiac cath by Dr. Susette Racer in November. 2 stents were placed at that time and patient says she hasn't felt right since. Though she has nitroglycerin at home she has not been taking this. Patient does endorse 2/10 left-sided chest pain at this time. She took all her medications at home this morning. Blood pressure is 183/79 on presentation. Initial EKG shows no acute changes from previous visit 2 days ago.    Cardiovascular: Regular rate and rhythm, no murmurs rubs or gallops, no peripheral edema.  Pulmonary: Clear to auscultation bilaterally, no wheezes, rhonchi, or rales GI: Positive bowel sounds. Soft,  Nontender,  nondistended. No rebound or guarding.  Date: 07/30/2011  Rate: 85  Rhythm: normal sinus rhythm  QRS Axis: normal  Intervals: PR prolonged  ST/T Wave abnormalities: nonspecific T wave changes  Conduction Disutrbances:low voltage QRS  Narrative Interpretation:   Old EKG Reviewed: unchanged from 07/28/2011  Assessment: 78 year-old female with history of CAD and diabetes presents today with intermittent ongoing chest pain.  Plan: Patient had initial cardiac workup ordered as well as aspirin and nitroglycerin tabs. She will be transferred back to acute care area for further management.    Cyndra Numbers, MD 07/30/11 1212

## 2011-07-31 DIAGNOSIS — I2510 Atherosclerotic heart disease of native coronary artery without angina pectoris: Secondary | ICD-10-CM

## 2011-07-31 LAB — GLUCOSE, CAPILLARY
Glucose-Capillary: 79 mg/dL (ref 70–99)
Glucose-Capillary: 115 mg/dL — ABNORMAL HIGH (ref 70–99)
Glucose-Capillary: 128 mg/dL — ABNORMAL HIGH (ref 70–99)

## 2011-07-31 LAB — BASIC METABOLIC PANEL
Calcium: 10.4 mg/dL (ref 8.4–10.5)
Creatinine, Ser: 1.33 mg/dL — ABNORMAL HIGH (ref 0.50–1.10)
GFR calc Af Amer: 40 mL/min — ABNORMAL LOW (ref >90)

## 2011-07-31 LAB — CBC
MCH: 26.1 pg (ref 26.0–34.0)
MCHC: 31.8 g/dL (ref 30.0–36.0)
MCV: 81.9 fL (ref 78.0–100.0)
Platelets: 194 10*3/uL (ref 150–400)
RDW: 15.5 % (ref 11.5–15.5)

## 2011-07-31 LAB — TSH: TSH: 1.618 u[IU]/mL (ref 0.350–4.500)

## 2011-07-31 LAB — LIPID PANEL
Cholesterol: 169 mg/dL (ref 0–200)
LDL Cholesterol: 88 mg/dL (ref 0–99)
Triglycerides: 64 mg/dL (ref <150)

## 2011-07-31 LAB — CARDIAC PANEL(CRET KIN+CKTOT+MB+TROPI)
Relative Index: INVALID (ref 0.0–2.5)
Total CK: 86 U/L (ref 7–177)

## 2011-07-31 MED ORDER — INSULIN ASPART 100 UNIT/ML ~~LOC~~ SOLN
0.0000 [IU] | Freq: Three times a day (TID) | SUBCUTANEOUS | Status: DC
  Administered 2011-07-31: 1 [IU] via SUBCUTANEOUS
  Administered 2011-08-01: 2 [IU] via SUBCUTANEOUS
  Filled 2011-07-31: qty 3

## 2011-07-31 MED ORDER — GLIMEPIRIDE 1 MG PO TABS
1.0000 mg | ORAL_TABLET | Freq: Every day | ORAL | Status: DC
  Administered 2011-08-01 – 2011-08-04 (×4): 1 mg via ORAL
  Filled 2011-07-31 (×6): qty 1

## 2011-07-31 NOTE — Progress Notes (Signed)
ANTICOAGULATION CONSULT NOTE - Follow Up Consult  Pharmacy Consult for heparin Indication: chest pain/ACS  Allergies  Allergen Reactions  . Statins Rash    Patient Measurements: Height: 5' 1.81" (157 cm) (from 05/27/11) Weight: 227 lb 1.2 oz (103 kg) (from 06/01/11) IBW/kg (Calculated) : 49.67  Adjusted Body Weight: 74kg  Vital Signs: Temp: 97.6 F (36.4 C) (01/20 1215) Temp src: Oral (01/20 1215) BP: 123/62 mmHg (01/20 0335) Pulse Rate: 68  (01/20 0600)  Labs:  Basename 07/31/11 0844 07/31/11 0535 07/30/11 2340 07/30/11 1817 07/30/11 1448 07/30/11 1152 07/28/11 1350 07/28/11 1344  HGB -- 12.4 -- -- -- 13.5 -- --  HCT -- 39.0 -- -- -- 41.7 41.6 --  PLT -- 194 -- -- -- 210 221 --  APTT -- -- -- 32 -- -- -- --  LABPROT -- -- -- 13.5 -- -- -- --  INR -- -- -- 1.01 -- -- -- --  HEPARINUNFRC 0.45 -- 0.28* -- -- -- -- --  CREATININE -- 1.33* -- -- -- 1.40* -- 1.45*  CKTOTAL 86 -- 86 -- 105 -- -- --  CKMB 4.2* -- 4.5* -- 4.7* -- -- --  TROPONINI <0.30 -- <0.30 -- <0.30 -- -- --   Estimated Creatinine Clearance: 32.8 ml/min (by C-G formula based on Cr of 1.33).   Medications:  Scheduled:     . acetaminophen  650 mg Oral BID  . allopurinol  100 mg Oral Daily  . amLODipine  10 mg Oral Daily  . aspirin  81 mg Oral Daily  . clopidogrel  75 mg Oral Q breakfast  . colesevelam  1,875 mg Oral BID WC  . ezetimibe  10 mg Oral Daily  . furosemide  40 mg Oral BID  . glimepiride  1 mg Oral QAC breakfast  . heparin  3,700 Units Intravenous Once  . insulin aspart  0-9 Units Subcutaneous TID WC  . levothyroxine  100 mcg Oral QAC breakfast  . LORazepam  0.5 mg Oral Q8H  . metoprolol  50 mg Oral BID  . nitroGLYCERIN  1 inch Topical Q8H  . pantoprazole  40 mg Oral Q1200  . sodium bicarbonate  650 mg Oral Daily  . sodium chloride  3 mL Intravenous Q12H  . DISCONTD: glimepiride  2 mg Oral QAC breakfast   Infusions:     . sodium chloride 10 mL/hr at 07/31/11 0600  . heparin 11  mL/hr (07/31/11 0600)    Assessment: 78yo female therapeutic on heparin for CP. CBC wnl. No bleeding noted. Cardiac enzymes negative. EKG today. Dr. Tyrone Sage to evaluate.   Goal of Therapy:  Heparin level 0.3-0.7 units/ml   Plan:  Continue heparin at current rate of 1100 units/hr. F/u AM labs.  Wyline Copas PharmD  07/31/2011,1:26 PM

## 2011-07-31 NOTE — Progress Notes (Signed)
Subjective: No chest pain  Objective: Vital signs in last 24 hours: Temp:  [97.4 F (36.3 C)-98 F (36.7 C)] 97.5 F (36.4 C) (01/20 0820) Pulse Rate:  [59-98] 68  (01/20 0600) Resp:  [14-26] 17  (01/20 0600) BP: (123-183)/(52-105) 123/62 mmHg (01/20 0335) SpO2:  [0 %-99 %] 94 % (01/20 0600) FiO2 (%):  [100 %] 100 % (01/19 1813) Weight:  [103 kg (227 lb 1.2 oz)] 103 kg (227 lb 1.2 oz) (01/19 1641) Weight change:  Last BM Date: 07/30/11 Intake/Output from previous day:-1425 01/19 0701 - 01/20 0700 In: -  Out: 1425 [Urine:1425] Intake/Output this shift:    PE: General: Awake, NAD Heart: RRR, s1/s2, no M/R/G's Lungs: clear bilaterally Abd: soft, non-tender, obese, +BS Ext: 2+ pulses, no edema   Lab Results:  Basename 07/31/11 0535 07/30/11 1152  WBC 5.2 5.9  HGB 12.4 13.5  HCT 39.0 41.7  PLT 194 210   BMET  Basename 07/31/11 0535 07/30/11 1152  NA 142 139  K 3.9 3.9  CL 107 106  CO2 28 23  GLUCOSE 78 142*  BUN 19 21  CREATININE 1.33* 1.40*  CALCIUM 10.4 10.4    Basename 07/30/11 2340 07/30/11 1448  TROPONINI <0.30 <0.30    Lab Results  Component Value Date   CHOL 169 07/31/2011   HDL 68 07/31/2011   LDLCALC 88 07/31/2011   TRIG 64 07/31/2011   CHOLHDL 2.5 07/31/2011   Lab Results  Component Value Date   HGBA1C 5.9* 07/30/2011     Lab Results  Component Value Date   TSH 1.618 07/30/2011    Hepatic Function Panel  Basename 07/30/11 1152  PROT 6.9  ALBUMIN 3.6  AST 39*  ALT 44*  ALKPHOS 135*  BILITOT 0.3  BILIDIR --  IBILI --    Basename 07/31/11 0535  CHOL 169   No results found for this basename: PROTIME in the last 72 hours    EKG: Orders placed during the hospital encounter of 07/30/11  . EKG 12-LEAD  . EKG 12-LEAD  . EKG 12-LEAD    Studies/Results: Dg Chest 2 View  07/30/2011  *RADIOLOGY REPORT*  Clinical Data: Shortness of breath with exertion, chest pain  CHEST - 2 VIEW  Comparison: 07/28/2011  Findings: Cardiomegaly.  No  frank interstitial edema. No pleural effusion or pneumothorax.  Degenerative changes of the visualized thoracolumbar spine.  IMPRESSION: No evidence of acute cardiopulmonary disease.  Cardiomegaly without frank interstitial edema.  Original Report Authenticated By: Charline Bills, M.D.    Medications: I have reviewed the patient's current medications.    Marland Kitchen acetaminophen  650 mg Oral BID  . allopurinol  100 mg Oral Daily  . amLODipine  10 mg Oral Daily  . aspirin  324 mg Oral Once  . aspirin  81 mg Oral Daily  . clopidogrel  75 mg Oral Q breakfast  . colesevelam  1,875 mg Oral BID WC  . ezetimibe  10 mg Oral Daily  . furosemide  40 mg Oral BID  . glimepiride  2 mg Oral QAC breakfast  . heparin  3,700 Units Intravenous Once  . levothyroxine  100 mcg Oral QAC breakfast  . LORazepam  0.5 mg Oral Q8H  . metoprolol  50 mg Oral BID  . nitroGLYCERIN  1 inch Topical Q8H  . pantoprazole  40 mg Oral Q1200  . sodium bicarbonate  650 mg Oral Daily  . sodium chloride  3 mL Intravenous Q12H   Assessment/Plan: Patient Active Problem List  Diagnoses  . CAD, multiple vessel, severe disease, no stents placed only angioplasty to mid RCA.   Marland Kitchen Hypertension, uncontrolled on arrival 07/30/11  . Dyslipidemia  . Hypothyroidism  . Renal insufficiency  . Diabetes type 2, controlled  . History of gout  . Drug reaction  . Episodic lightheadedness, with near syncope X 3  . Racing heart beat, episodically  . Chest pain at rest, possible cardiac.   PLAN: Negative cardiac enzymes.  Cr. Improved from 1.45 to 1.33. TSH stable.  Pt. Complains of feeling hot at times. Dr. Tyrone Sage to evaluate today. Heparin infusing. VS stable.  LOS: 1 day   INGOLD,LAURA R 07/31/2011, 8:31 AM  Pt. Seen and examined. Agree with the NP/PA-C note as written.  Chrystie Nose, MD Attending Cardiologist [facility]

## 2011-07-31 NOTE — Progress Notes (Signed)
ANTICOAGULATION CONSULT NOTE - Follow Up Consult  Pharmacy Consult for heparin Indication: chest pain/ACS  Allergies  Allergen Reactions  . Statins Rash    Patient Measurements: Height: 5' 1.81" (157 cm) (from 05/27/11) Weight: 227 lb 1.2 oz (103 kg) (from 06/01/11) IBW/kg (Calculated) : 49.67  Adjusted Body Weight: 74kg  Vital Signs: Temp: 97.6 F (36.4 C) (01/19 2352) Temp src: Oral (01/19 2352) BP: 128/58 mmHg (01/19 2300) Pulse Rate: 59  (01/20 0100)  Labs:  Basename 07/30/11 2340 07/30/11 1817 07/30/11 1448 07/30/11 1152 07/28/11 1355 07/28/11 1350 07/28/11 1344  HGB -- -- -- 13.5 -- 13.3 --  HCT -- -- -- 41.7 -- 41.6 --  PLT -- -- -- 210 -- 221 --  APTT -- 32 -- -- -- -- --  LABPROT -- 13.5 -- -- -- -- --  INR -- 1.01 -- -- -- -- --  HEPARINUNFRC 0.28* -- -- -- -- -- --  CREATININE -- -- -- 1.40* -- -- 1.45*  CKTOTAL 86 -- 105 -- 95 -- --  CKMB 4.5* -- 4.7* -- 5.1* -- --  TROPONINI <0.30 -- <0.30 -- <0.30 -- --   Estimated Creatinine Clearance: 31.1 ml/min (by C-G formula based on Cr of 1.4).   Medications:  Scheduled:    . acetaminophen  650 mg Oral BID  . allopurinol  100 mg Oral Daily  . amLODipine  10 mg Oral Daily  . aspirin  324 mg Oral Once  . aspirin  81 mg Oral Daily  . clopidogrel  75 mg Oral Q breakfast  . colesevelam  1,875 mg Oral BID WC  . ezetimibe  10 mg Oral Daily  . furosemide  40 mg Oral BID  . glimepiride  2 mg Oral QAC breakfast  . heparin  3,700 Units Intravenous Once  . levothyroxine  100 mcg Oral QAC breakfast  . LORazepam  0.5 mg Oral Q8H  . metoprolol  50 mg Oral BID  . nitroGLYCERIN  1 inch Topical Q8H  . pantoprazole  40 mg Oral Q1200  . sodium bicarbonate  650 mg Oral Daily  . sodium chloride  3 mL Intravenous Q12H   Infusions:    . sodium chloride 10 mL/hr at 07/31/11 0100  . heparin 9 mL/hr (07/31/11 0100)    Assessment: 78yo female subtherapeutic on heparin with initial dosing for CP; suspect level may be  falsely higher than expected due to bolus effect (103kg with gtt at <10units/kg/hr).  Goal of Therapy:  Heparin level 0.3-0.7 units/ml   Plan:  Will increase heparin by 2 units/kg/hr to 1100 units/hr and check level with next CE (will be early but attempting to decrease number of sticks to pt).  Colleen Can PharmD BCPS 07/31/2011,2:18 AM

## 2011-07-31 NOTE — Consult Note (Signed)
[address]            [name] [address]          [phone]     CARDIOTHORACIC SURGERY CONSULTATION REPORT  PCP is Adela Lank, MD Attending physician is Bryan Lemma, MD Referring Provider is Bryan Lemma, MD   Reason for consultation:  Left main disease, s/p acute inferior MI  HPI:  Patient is an 78 year old morbidly obese African American female from [place] with no previous history of coronary artery disease but risk factors notable for history of hypertension, type 2 diabetes mellitus, and hyperlipidemia. The patient has a long history of exertional shortness of breath that has reportedly gotten progressively worse over several years. The patient has very limited physical mobility do to arthritis involving her hips and knees. She reports that functionally she has not been able to do much of anything for quite some time. In NOV 2012 she developed sudden onset of substernal chest tightness that was associated with shortness of breath and persisted, prompting her to present to the emergency room where she was diagnosed with acute of all for an inferior wall myocardial infarction. Cardiac catheterization was performed by Dr. Herbie Baltimore and notable for ostial left main stenosis and 100% occlusion of the mid right coronary artery. Telephone consultation with cardiothoracic surgery was performed and a decision was made to proceed with primary balloon angioplasty of the culprit right coronary artery. This was performed successfully and the patient has remained clinically stable after. Dr Cornelius Moras discussed CABG with patient and family. She ultima ly  did not undergo treatment for left main disease and was discharged home on Plavix. She returned yesterday with mild chest discomfort, not as severe as before and now is pain free on heparin. Repeat cardiac surgery consult requested.   Past Medical History  Diagnosis Date  . Diabetes mellitus   .  Hypertension   . PONV (postoperative nausea and vomiting)   . Angina   . Shortness of breath   . Chronic kidney disease Base line Cr 1.4  . Hypothyroidism   . Headache   . Arthritis   . Anxiety   . Myocardial infarction   . Pneumonia   . Episodic lightheadedness, with near syncope X 3 07/30/2011  . Coronary artery disease Left main and inferior infarct 05/2011    Past Surgical History  Procedure Date  . Joint replacement     both hips replaced  . Cardiac catheterization   . Coronary angioplasty     Family History  Problem Relation Age of Onset  . Breast cancer Mother   . Heart disease Father     Social History History  Substance Use Topics  . Smoking status: Never Smoker   . Smokeless tobacco: Not on file  . Alcohol Use: 0.6 oz/week    1 Glasses of wine per week    Current Facility-Administered Medications  Medication Dose Route Frequency Provider Last Rate Last Dose  . sodium chloride 0.9 % injection 3 mL  3 mL Intravenous Q12H Meagan Hunt, MD   3 mL at 07/30/11 1230   And  . sodium chloride 0.9 % injection 3 mL  3 mL Intravenous PRN Meagan Hunt, MD       And  . 0.9 %  sodium chloride infusion  250 mL Intravenous PRN Cyndra Numbers, MD      .  0.9 %  sodium chloride infusion   Intravenous Continuous Leone Brand, NP 10 mL/hr at 07/31/11 0600    . acetaminophen (TYLENOL) tablet 650 mg  650 mg Oral BID Leone Brand, NP   650 mg at 07/31/11 1400  . acetaminophen (TYLENOL) tablet 650 mg  650 mg Oral Q4H PRN Leone Brand, NP      . allopurinol (ZYLOPRIM) tablet 100 mg  100 mg Oral Daily Leone Brand, NP   100 mg at 07/31/11 1035  . amLODipine (NORVASC) tablet 10 mg  10 mg Oral Daily Leone Brand, NP   10 mg at 07/31/11 1036  . aspirin chewable tablet 81 mg  81 mg Oral Daily Leone Brand, NP   81 mg at 07/31/11 1035  . clopidogrel (PLAVIX) tablet 75 mg  75 mg Oral Q breakfast Leone Brand, NP   75 mg at 07/31/11 1036  . colesevelam [facility]) tablet 1,875 mg   1,875 mg Oral BID WC Leone Brand, NP   1,875 mg at 07/31/11 1035  . ezetimibe (ZETIA) tablet 10 mg  10 mg Oral Daily Leone Brand, NP   10 mg at 07/31/11 1036  . furosemide (LASIX) tablet 40 mg  40 mg Oral BID Leone Brand, NP   40 mg at 07/31/11 1035  . glimepiride (AMARYL) tablet 1 mg  1 mg Oral QAC breakfast Leone Brand, NP      . heparin ADULT infusion 100 units/ml (25000 units/250 ml)  1,100 Units/hr Intravenous Continuous Colleen Can, PHARMD 11 mL/hr at 07/31/11 0600 11 mL/hr at 07/31/11 0600  . heparin bolus via infusion 3,700 Units  3,700 Units Intravenous Once Gala Lewandowsky [place], [place]   3,700 Units at 07/30/11 1852  . insulin aspart (novoLOG) injection 0-9 Units  0-9 Units Subcutaneous TID WC Leone Brand, NP      . levothyroxine (SYNTHROID, LEVOTHROID) tablet 100 mcg  100 mcg Oral QAC breakfast Leone Brand, NP   100 mcg at 07/31/11 1034  . LORazepam (ATIVAN) tablet 0.5 mg  0.5 mg Oral Q8H Leone Brand, NP   0.5 mg at 07/31/11 1400  . metoprolol (LOPRESSOR) tablet 50 mg  50 mg Oral BID Leone Brand, NP   50 mg at 07/31/11 1036  . nitroGLYCERIN (NITROGLYN) 2 % ointment 1 inch  1 inch Topical Q8H Leone Brand, NP   1 inch at 07/31/11 1400  . nitroGLYCERIN (NITROSTAT) SL tablet 0.4 mg  0.4 mg Sublingual Q5 min PRN Leone Brand, NP      . ondansetron [facility]) injection 4 mg  4 mg Intravenous Q6H PRN Leone Brand, NP      . pantoprazole (PROTONIX) EC tablet 40 mg  40 mg Oral Q1200 Leone Brand, NP   40 mg at 07/31/11 1401  . sodium bicarbonate tablet 650 mg  650 mg Oral Daily Leone Brand, NP   650 mg at 07/31/11 1036  . zolpidem (AMBIEN) tablet 5 mg  5 mg Oral QHS PRN Leone Brand, NP      . DISCONTD: glimepiride (AMARYL) tablet 2 mg  2 mg Oral QAC breakfast Leone Brand, NP        Allergies  Allergen Reactions  . Statins Rash    Review of Systems:  General:  normal appetite but moderate progressive generalized weakness  Respiratory:  no  cough, no wheezing, no hemoptysis, no pain with inspiration or cough, episode pneumonia  last year  Cardiac:  As per HPI, the patient has fairly severe exertional SOB which has been slowly progressive for several years, no resting SOB, no PND, no orthopnea, she has                                                 chronic LE edema, no palpitations, no syncope  GI:   no difficulty swallowing, no hematochezia, no hematemesis, no melena, no constipation, no diarrhea  GU:   no dysuria, no urgency, no frequency, one severe UTI last year  Musculoskeletal: Significant arthritis both knees, both hips (s/p bilateral THR), and hands. Very limited mobility with walker or cane  Vascular:  no pain suggestive of claudication   Neuro:   no symptoms suggestive of TIA's, no seizures, no headaches, + peripheral neuropathy both feet  Endocrine:  Diabetes reportedly well controlled  HEENT:  no loose teeth or painful teeth,  no recent vision changes  Psych:   no anxiety, no depression    Physical Exam:   BP 123/62  Pulse 68  Temp(Src) 98.2 F (36.8 C) (Oral)  Resp 17  Ht 5' 1.81" (1.57 m)  Wt 227 lb 1.2 oz (103 kg)  BMI 41.79 kg/m2  SpO2 94%  General:  Obese, elderly, NAD   HEENT:  Unremarkable   Neck:   no JVD, no bruits, no adenopathy   Chest:   clear to auscultation, symmetrical breath sounds, no wheezes, no rhonchi   CV:   RRR, no murmur   Abdomen:  soft, non-tender, no masses   Extremities:  warm, adequately-perfused, pulses non-palpable, chronic venous insufficiency and chronic skin changes unable access suitable of leg vein for condiut  Rectal/GU  Deferred  Neuro:   Grossly non-focal and symmetrical throughout  Skin:   Clean and dry, no rashes, no breakdown  Diagnostic Tests:  Cardiac catheterization performed yesterday by Dr. Herbie Baltimore is reviewed. There is 100% occlusion of the mid right coronary artery. There were 2 brief images of the left coronary circulation. There is ostial stenosis of left  main coronary artery with a somewhat angulated take off of the left main. The severity of stenosis is difficult to quantify but probably is significant. The remainder of the left coronary circulation is not well visualized. There does appear to be high-grade stenosis of the distal left anterior descending coronary artery. There is significant calcification in the descending aorta and significant calcification in the coronary arteries themselves. Primary balloon angioplasty of the mid right coronary artery was performed with good result. There is right dominant coronary circulation with normal antegrade flow after completion of the procedure.  Transthoracic echocardiogram is reviewed. This demonstrates essentially normal left ventricular size and function. There is no significant mitral regurgitation.  Impression:  Ostial left main disease with three-vessel coronary artery disease status post acute inferior wall myocardial infarction treated with primary balloon angioplasty of the culprit right coronary artery. The patient readmitted with further chest pain. The patient has chronic exertional shortness of breath which is fairly significant despite normal left ventricular systolic function. This could be anginal equivalent and related to the patient's left main disease. The patient also has very limited physical mobility do to generalized weakness, exertional shortness of breath, obesity, and significant chronic arthritis afflicting her hips and knees.  There is no question that long-term survival would be best if  she was treated for surgical revascularization of her coronary artery disease. However, the risks of surgery will be fairly high. STS score: Mortality 7.4% (4 times average) Morbidity or Mortality 38% Long LOS    25% Stroke     4.2% Prolonged ventilation 26% Sternal wound infection 1.4% Renal Failure      21.2% Reoperation 9.8% This information has been discussed with the patient and daughter  in detail   Options include   high-risk PCI and stenting of the left main coronary artery and relook at RCA vs high risk CABG (most of risk involves prolonged hospitalization and recovery rather then intra op death). Based upon the limited views from the catheterization, it is difficult to tell exactly how tight left main stenosis is, although it is undoubtedly significant.  Plan: Hold Plavix pending decision P2Y12 test Vein mapping to access conduit in AM Dr Tresa Endo to review films for risk assessment of high risk angioplasty/stent/rotoblade of left main and relook at RCA  [name] MD  Beeper [phone] Office [phone] 07/31/2011 4:12 PM

## 2011-07-31 NOTE — Progress Notes (Signed)
Pt c/o of feeling hot despite room temperature adjustment. Pt says that she has had this feeling for several weeks. Pt had not advised PCP. Pt on thyroid medication. Will continue to monitor.

## 2011-08-01 ENCOUNTER — Inpatient Hospital Stay (HOSPITAL_COMMUNITY): Payer: Medicare Other

## 2011-08-01 DIAGNOSIS — I2510 Atherosclerotic heart disease of native coronary artery without angina pectoris: Secondary | ICD-10-CM

## 2011-08-01 DIAGNOSIS — Z01810 Encounter for preprocedural cardiovascular examination: Secondary | ICD-10-CM

## 2011-08-01 LAB — BASIC METABOLIC PANEL
Calcium: 10.2 mg/dL (ref 8.4–10.5)
GFR calc non Af Amer: 31 mL/min — ABNORMAL LOW (ref >90)
Glucose, Bld: 92 mg/dL (ref 70–99)
Sodium: 145 mEq/L (ref 135–145)

## 2011-08-01 LAB — CBC
MCH: 26.2 pg (ref 26.0–34.0)
MCHC: 31.7 g/dL (ref 30.0–36.0)
Platelets: 203 10*3/uL (ref 150–400)

## 2011-08-01 LAB — SURGICAL PCR SCREEN: MRSA, PCR: NEGATIVE

## 2011-08-01 LAB — GLUCOSE, CAPILLARY
Glucose-Capillary: 118 mg/dL — ABNORMAL HIGH (ref 70–99)
Glucose-Capillary: 152 mg/dL — ABNORMAL HIGH (ref 70–99)

## 2011-08-01 LAB — PLATELET INHIBITION P2Y12: Platelet Function  P2Y12: 249 [PRU] (ref 194–418)

## 2011-08-01 LAB — HEPARIN LEVEL (UNFRACTIONATED): Heparin Unfractionated: 0.68 IU/mL (ref 0.30–0.70)

## 2011-08-01 MED ORDER — ALBUTEROL SULFATE (5 MG/ML) 0.5% IN NEBU
2.5000 mg | INHALATION_SOLUTION | Freq: Once | RESPIRATORY_TRACT | Status: AC
  Administered 2011-08-01: 2.5 mg via RESPIRATORY_TRACT

## 2011-08-01 MED ORDER — HEPARIN SOD (PORCINE) IN D5W 100 UNIT/ML IV SOLN
1050.0000 [IU]/h | INTRAVENOUS | Status: DC
  Administered 2011-08-01: 900 [IU]/h via INTRAVENOUS
  Administered 2011-08-05: 1050 [IU]/h via INTRAVENOUS
  Filled 2011-08-01 (×7): qty 250

## 2011-08-01 MED ORDER — ISOSORBIDE MONONITRATE ER 30 MG PO TB24
30.0000 mg | ORAL_TABLET | Freq: Every day | ORAL | Status: DC
  Administered 2011-08-01 – 2011-08-02 (×2): 30 mg via ORAL
  Filled 2011-08-01 (×3): qty 1

## 2011-08-01 NOTE — Progress Notes (Addendum)
Bilateral carotid artery duplex completed.  Preliminary report is no evidence of significant ICA stenosis.   Smiley Houseman 08/01/2011, 11:04 AM

## 2011-08-01 NOTE — Progress Notes (Signed)
Patient ID: [name], female   DOB: [birth year], 78 y.o.   MRN: [id number] TCTS DAILY PROGRESS NOTE                   [address]            [name] [address]          [phone]        Total Length of Stay:  LOS: 2 days   Subjective: No chest pain, went to PFT's off monitor with out symptoms  Objective: Vital signs in last 24 hours: Temp:  [97.5 F (36.4 C)-98.4 F (36.9 C)] 98 F (36.7 C) (01/21 1606) Cardiac Rhythm:  [-] Normal sinus rhythm (01/21 1607) Resp:  [13-25] 25  (01/21 1607) BP: (122-140)/(53-94) 140/61 mmHg (01/21 1607) SpO2:  [93 %-98 %] 93 % (01/21 1606)  Wt Readings from Last 3 Encounters:  07/30/11 227 lb 1.2 oz (103 kg)  06/01/11 227 lb 1.2 oz (103 kg)  06/01/11 227 lb 1.2 oz (103 kg)      Intake/Output from previous day: 01/20 0701 - 01/21 0700 In: 684 [P.O.:180; I.V.:504] Out: 700 [Urine:700]  Intake/Output this shift: Total I/O In: 893.7 [P.O.:720; I.V.:173.7] Out: 350 [Urine:350]  Current Meds: Scheduled Meds:   . acetaminophen  650 mg Oral BID  . albuterol  2.5 mg Nebulization Once  . allopurinol  100 mg Oral Daily  . amLODipine  10 mg Oral Daily  . aspirin  81 mg Oral Daily  . colesevelam  1,875 mg Oral BID WC  . ezetimibe  10 mg Oral Daily  . furosemide  40 mg Oral BID  . glimepiride  1 mg Oral QAC breakfast  . insulin aspart  0-9 Units Subcutaneous TID WC  . isosorbide mononitrate  30 mg Oral Daily  . levothyroxine  100 mcg Oral QAC breakfast  . LORazepam  0.5 mg Oral Q8H  . metoprolol  50 mg Oral BID  . pantoprazole  40 mg Oral Q1200  . sodium bicarbonate  650 mg Oral Daily  . sodium chloride  3 mL Intravenous Q12H  . DISCONTD: clopidogrel  75 mg Oral Q breakfast  . DISCONTD: nitroGLYCERIN  1 inch Topical Q8H   Continuous Infusions:   . sodium chloride 10 mL/hr at 07/31/11 0600  . heparin 900 Units/hr (08/01/11 1639)  . DISCONTD: heparin 1,100 Units/hr (07/31/11 1600)   PRN Meds:.sodium  chloride, acetaminophen, nitroGLYCERIN, ondansetron (ZOFRAN) IV, sodium chloride, zolpidem  General appearance: alert, cooperative, appears stated age and no distress Neurologic: intact Heart: regular rate and rhythm, S1, S2 normal, no murmur, click, rub or gallop Lungs: clear to auscultation bilaterally Extremities: Homans sign is negative, no sign of DVT  Lab Results: CBC: Basename 08/01/11 0522 07/31/11 0535  WBC 5.1 5.2  HGB 12.9 12.4  HCT 40.7 39.0  PLT 203 194   BMET:  Basename 08/01/11 0522 07/31/11 0535  NA 145 142  K 3.6 3.9  CL 109 107  CO2 28 28  GLUCOSE 92 78  BUN 22 19  CREATININE 1.47* 1.33*  CALCIUM 10.2 10.4    PT/INR:  Basename 07/30/11 1817  LABPROT 13.5  INR 1.01   Radiology: No results found.  PFTs FEV1= .74 75% DLCO             33%         moderate sever restrictive lung disease with sever diffusion defect  Assessment/Plan:  Considering   CORONARY ARTERY BYPASS GRAFTING (CABG) (  N/A) Discussed left main stenting with Dr Tresa Endo, obviously high risk he is not inclined to offer Patient considering her options Keep off Plavix for now    [name] MD  Beeper [phone] Office [phone] 08/01/2011 4:59 PM

## 2011-08-01 NOTE — Progress Notes (Signed)
Pharmacy Consult - Heparin  Heparin level this PM = 0.68 (therapeutic) No bleeding noted Reviewing options for CABG  Plan:  1) Continue heparin at 900 units / hr 2) Follow up AM heparin level  Thank you.  Okey Regal, PharmD

## 2011-08-01 NOTE — Progress Notes (Signed)
6:48 AM  Heparin level this am increased to 0.91. No bleeding noted.   Hgb/hct/platelets are stable.   Decreasing to 900 units/hr will recheck in 8 hours.   Janice Coffin

## 2011-08-01 NOTE — Progress Notes (Signed)
Pt. Seen and examined. Agree with the NP/PA-C note as written.  Dr. Tresa Endo has reviewed films. Will discuss with Dr. Tyrone Sage. Holding plavix. Await P2Y12 today.  Chrystie Nose, MD Attending Cardiologist [facility]

## 2011-08-01 NOTE — Progress Notes (Signed)
Subjective: No chest pain, but occ. Mild chest pain will come and go. Denies any dizziness since admit. +SOB with ambulation.  Objective: Vital signs in last 24 hours: Temp:  [97.5 F (36.4 C)-98.4 F (36.9 C)] 97.5 F (36.4 C) (01/21 0728) Pulse Rate:  [64-82] 65  (01/20 1200) Resp:  [13-23] 15  (01/21 0728) BP: (122-125)/(56-94) 122/94 mmHg (01/21 0728) SpO2:  [93 %-98 %] 98 % (01/20 2003) Weight change:  Last BM Date: 07/31/11 Intake/Output from previous day: +244 01/20 0701 - 01/21 0700 In: 684 [P.O.:180; I.V.:504] Out: 700 [Urine:700] Intake/Output this shift:    PE: General:A& O X3, MAE, pleasant affect. Heart:S1S2 RRR Lungs:few crackles bases. ABD:+BS, soft, non-tender Ext:no edema.  TELE:  SR Lab Results:  Basename 08/01/11 0522 07/31/11 0535  WBC 5.1 5.2  HGB 12.9 12.4  HCT 40.7 39.0  PLT 203 194   BMET  Basename 08/01/11 0522 07/31/11 0535  NA 145 142  K 3.6 3.9  CL 109 107  CO2 28 28  GLUCOSE 92 78  BUN 22 19  CREATININE 1.47* 1.33*  CALCIUM 10.2 10.4    Basename 07/31/11 0844 07/30/11 2340  TROPONINI <0.30 <0.30    Lab Results  Component Value Date   CHOL 169 07/31/2011   HDL 68 07/31/2011   LDLCALC 88 07/31/2011   TRIG 64 07/31/2011   CHOLHDL 2.5 07/31/2011   Lab Results  Component Value Date   HGBA1C 5.9* 07/30/2011     Lab Results  Component Value Date   TSH 1.618 07/30/2011    Hepatic Function Panel  Basename 07/30/11 1152  PROT 6.9  ALBUMIN 3.6  AST 39*  ALT 44*  ALKPHOS 135*  BILITOT 0.3  BILIDIR --  IBILI --    Basename 07/31/11 0535  CHOL 169   No results found for this basename: PROTIME in the last 72 hours    EKG: Orders placed during the hospital encounter of 07/30/11  . EKG 12-LEAD  . EKG 12-LEAD  . EKG 12-LEAD  . EKG    Studies/Results: Dg Chest 2 View  07/30/2011  *RADIOLOGY REPORT*  Clinical Data: Shortness of breath with exertion, chest pain  CHEST - 2 VIEW  Comparison: 07/28/2011  Findings:  Cardiomegaly.  No frank interstitial edema. No pleural effusion or pneumothorax.  Degenerative changes of the visualized thoracolumbar spine.  IMPRESSION: No evidence of acute cardiopulmonary disease.  Cardiomegaly without frank interstitial edema.  Original Report Authenticated By: Charline Bills, M.D.    Medications: I have reviewed the patient's current medications.    Marland Kitchen acetaminophen  650 mg Oral BID  . allopurinol  100 mg Oral Daily  . amLODipine  10 mg Oral Daily  . aspirin  81 mg Oral Daily  . colesevelam  1,875 mg Oral BID WC  . ezetimibe  10 mg Oral Daily  . furosemide  40 mg Oral BID  . glimepiride  1 mg Oral QAC breakfast  . insulin aspart  0-9 Units Subcutaneous TID WC  . levothyroxine  100 mcg Oral QAC breakfast  . LORazepam  0.5 mg Oral Q8H  . metoprolol  50 mg Oral BID  . nitroGLYCERIN  1 inch Topical Q8H  . pantoprazole  40 mg Oral Q1200  . sodium bicarbonate  650 mg Oral Daily  . sodium chloride  3 mL Intravenous Q12H  . DISCONTD: clopidogrel  75 mg Oral Q breakfast   Assessment/Plan: Patient Active Problem List  Diagnoses  . CAD, multiple vessel, severe disease, no stents placed  only angioplasty to mid RCA.   Marland Kitchen Hypertension, uncontrolled on arrival 07/30/11  . Dyslipidemia  . Hypothyroidism  . Renal insufficiency  . Diabetes type 2, controlled  . History of gout  . Drug reaction  . Episodic lightheadedness, with near syncope X 3  . Racing heart beat, episodically  . Chest pain at rest, possible cardiac.   PLAN:Appreciate Dr. Dennie Maizes assistance. Dr. Tresa Endo to review films.  Will change nitro paste to Imdur.   Glucose stable with less amaryl.  LOS: 2 days   Laine Fonner R 08/01/2011, 8:58 AM

## 2011-08-01 NOTE — Progress Notes (Signed)
VASCULAR LAB PRELIMINARY  PRELIMINARY  PRELIMINARY  PRELIMINARY  Right Lower Extremity Vein Map    Right Great Saphenous Vein   Segment Diameter Comment  1. Origin mm Too oblique  2. High Thigh 4.9 mm   3. Mid Thigh 4.4 mm branch  4. Low Thigh 2.8 mm   5. At Knee 2.8 mm branch  6. High Calf 1.9 mm branch  7. Low Calf 2.3 mm   8. Ankle 3.1 mm branch   mm    mm    mm    Left Lower Extremity Vein Map    Left Great Saphenous Vein   Segment Diameter Comment  1. Origin mm Too oblique  2. High Thigh 3.8 mm branch  3. Mid Thigh 2.2 mm branch  4. Low Thigh 1.7 mm   5. At Knee 2.0 mm   6. High Calf 1.8 mm   7. Low Calf 1.9 mm   8. Ankle 1.9 mm    mm    mm    mm     Smiley Houseman 08/01/2011, 11:07 AM

## 2011-08-02 DIAGNOSIS — I2510 Atherosclerotic heart disease of native coronary artery without angina pectoris: Secondary | ICD-10-CM

## 2011-08-02 DIAGNOSIS — Z01810 Encounter for preprocedural cardiovascular examination: Secondary | ICD-10-CM

## 2011-08-02 LAB — BASIC METABOLIC PANEL
GFR calc Af Amer: 32 mL/min — ABNORMAL LOW (ref >90)
GFR calc non Af Amer: 27 mL/min — ABNORMAL LOW (ref >90)
Glucose, Bld: 78 mg/dL (ref 70–99)
Potassium: 3.7 mEq/L (ref 3.5–5.1)
Sodium: 143 mEq/L (ref 135–145)

## 2011-08-02 LAB — HEPARIN LEVEL (UNFRACTIONATED): Heparin Unfractionated: 0.49 IU/mL (ref 0.30–0.70)

## 2011-08-02 LAB — CBC
Hemoglobin: 12.8 g/dL (ref 12.0–15.0)
MCHC: 31.5 g/dL (ref 30.0–36.0)
Platelets: 217 10*3/uL (ref 150–400)

## 2011-08-02 LAB — GLUCOSE, CAPILLARY
Glucose-Capillary: 104 mg/dL — ABNORMAL HIGH (ref 70–99)
Glucose-Capillary: 102 mg/dL — ABNORMAL HIGH (ref 70–99)
Glucose-Capillary: 99 mg/dL (ref 70–99)

## 2011-08-02 LAB — PLATELET INHIBITION P2Y12: Platelet Function  P2Y12: 270 [PRU] (ref 194–418)

## 2011-08-02 NOTE — Progress Notes (Signed)
Pre-op Cardiac Surgery   Upper Extremity Right Left  Brachial Pressures 145 126  Radial Waveforms Triphasic Triphasic  Ulnar Waveforms Triphasic Triphasic  Palmar Arch (Allen's Test) Waveform remains normal with radial compression and obliterates with ulnar compression Waveform obliterates with radial compression and remains normal with ulnar compression      Lower  Extremity Right Left  Dorsalis Pedis 109, Monophasic 111, Monophasic  Anterior Tibial X X  Posterior Tibial 97, Monophasic 96, Monophasic  Ankle/Brachial Indices 0.75 0.77    Findings:  Bilateral lower extremities consistent with mild disease.

## 2011-08-02 NOTE — Progress Notes (Signed)
TCTS DAILY PROGRESS NOTE                   [address]            [name] [address]          [phone]        Procedure(s) (LRB): CORONARY ARTERY BYPASS GRAFTING (CABG) (N/A)  Total Length of Stay:  LOS: 3 days   Subjective: Seen by rehab today  Objective: Vital signs in last 24 hours: Temp:  [97.4 F (36.3 C)-98.2 F (36.8 C)] 97.7 F (36.5 C) (01/22 1900) Pulse Rate:  [58-75] 75  (01/22 2146) Cardiac Rhythm:  [-] Normal sinus rhythm (01/22 1600) Resp:  [13-24] 17  (01/22 1930) BP: (113-138)/(49-65) 134/65 mmHg (01/22 1930) SpO2:  [90 %-95 %] 95 % (01/22 1616)  Filed Weights   07/30/11 1641  Weight: 227 lb 1.2 oz (103 kg)    Weight change:    Hemodynamic parameters for last 24 hours:    Intake/Output from previous day: 01/21 0701 - 01/22 0700 In: 1418.7 [P.O.:960; I.V.:458.7] Out: 650 [Urine:650]  Intake/Output this shift: Total I/O In: -  Out: 350 [Urine:350]  Current Meds: Scheduled Meds:   . acetaminophen  650 mg Oral BID  . allopurinol  100 mg Oral Daily  . amLODipine  10 mg Oral Daily  . aspirin  81 mg Oral Daily  . colesevelam  1,875 mg Oral BID WC  . ezetimibe  10 mg Oral Daily  . furosemide  40 mg Oral BID  . glimepiride  1 mg Oral QAC breakfast  . insulin aspart  0-9 Units Subcutaneous TID WC  . isosorbide mononitrate  30 mg Oral Daily  . levothyroxine  100 mcg Oral QAC breakfast  . LORazepam  0.5 mg Oral Q8H  . metoprolol  50 mg Oral BID  . pantoprazole  40 mg Oral Q1200  . sodium bicarbonate  650 mg Oral Daily  . sodium chloride  3 mL Intravenous Q12H   Continuous Infusions:   . sodium chloride 10 mL/hr at 08/01/11 1900  . heparin 9 mL/hr (08/01/11 1900)   PRN Meds:.sodium chloride, acetaminophen, nitroGLYCERIN, ondansetron (ZOFRAN) IV, sodium chloride, zolpidem  General appearance: alert and cooperative Neurologic: intact Heart: regular rate and rhythm, S1, S2 normal, no murmur, click, rub or gallop Lungs:  clear to auscultation bilaterally Abdomen: soft, non-tender; bowel sounds normal; no masses,  no organomegaly  Lab Results: CBC: Basename 08/02/11 0610 08/01/11 0522  WBC 6.8 5.1  HGB 12.8 12.9  HCT 40.6 40.7  PLT 217 203   BMET:  Basename 08/02/11 0610 08/01/11 0522  NA 143 145  K 3.7 3.6  CL 107 109  CO2 26 28  GLUCOSE 78 92  BUN 27* 22  CREATININE 1.61* 1.47*  CALCIUM 10.2 10.2    PT/INR: No results found for this basename: LABPROT,INR in the last 72 hours Radiology: No results found.   Assessment/Plan: S/P Procedure(s) (LRB): CORONARY ARTERY BYPASS GRAFTING (CABG) (N/A) planned for Friday  Patient agreeable Rehab came by but did not answer question asked I have sent request to return Cr is more elevated today if continues to rise would preclude surgery  [name] MD  Beeper [phone] Office [phone] 08/02/2011 9:59 PM

## 2011-08-02 NOTE — Progress Notes (Signed)
ANTICOAGULATION CONSULT NOTE - Follow Up Consult  Pharmacy Consult for heparin  Indication: awaiting CABG vs PCI  Allergies  Allergen Reactions  . Statins Rash    Patient Measurements: Height: 5' 1.81" (157 cm) (from 05/27/11) Weight: 227 lb 1.2 oz (103 kg) (from 06/01/11) IBW/kg (Calculated) : 49.67  Heparin Dosing Weight:   Vital Signs: Temp: 98.2 F (36.8 C) (01/22 0800) Temp src: Oral (01/22 0800) BP: 128/52 mmHg (01/22 0400)  Labs:  Alvira Philips 08/02/11 0610 08/01/11 1610 08/01/11 0522 07/31/11 0844 07/31/11 0535 07/30/11 2340 07/30/11 1817 07/30/11 1448  HGB 12.8 -- 12.9 -- -- -- -- --  HCT 40.6 -- 40.7 -- 39.0 -- -- --  PLT 217 -- 203 -- 194 -- -- --  APTT -- -- -- -- -- -- 32 --  LABPROT -- -- -- -- -- -- 13.5 --  INR -- -- -- -- -- -- 1.01 --  HEPARINUNFRC 0.49 0.68 0.91* -- -- -- -- --  CREATININE 1.61* -- 1.47* -- 1.33* -- -- --  CKTOTAL -- -- -- 86 -- 86 -- 105  CKMB -- -- -- 4.2* -- 4.5* -- 4.7*  TROPONINI -- -- -- <0.30 -- <0.30 -- <0.30   Estimated Creatinine Clearance: 27.1 ml/min (by C-G formula based on Cr of 1.61).   Medications:  Infusions:    . sodium chloride 10 mL/hr at 08/01/11 1900  . heparin 9 mL/hr (08/01/11 1900)    Assessment: 44 yof s/p cath with angioplasty to RCA yesterday. Restarted on heparin pending CABG vs PCI decision. Heparin is at goal (0.49) and appears to have stabilized, no bleeding complications noted overnight.  Goal of Therapy:  Heparin level 0.3-0.7 units/ml   Plan:  Continue heparin at 900 units/hr Follow plan  Severiano Gilbert 08/02/2011,9:18 AM

## 2011-08-02 NOTE — Progress Notes (Signed)
[facility]  Subjective: No CP, SOB, orthopnea  Objective: Vital signs in last 24 hours: Temp:  [97.4 F (36.3 C)-98 F (36.7 C)] 97.4 F (36.3 C) (01/22 0400) Resp:  [13-25] 13  (01/22 0400) BP: (113-140)/(48-98) 128/52 mmHg (01/22 0400) SpO2:  [93 %-95 %] 95 % (01/22 0400) Last BM Date: 08/01/11  Intake/Output from previous day: 01/21 0701 - 01/22 0700 In: 1361.7 [P.O.:960; I.V.:401.7] Out: 350 [Urine:350] Intake/Output this shift: Total I/O In: 411 [P.O.:240; I.V.:171] Out: -   Medications Current Facility-Administered Medications  Medication Dose Route Frequency Provider Last Rate Last Dose  . sodium chloride 0.9 % injection 3 mL  3 mL Intravenous Q12H Meagan Hunt, MD   3 mL at 08/01/11 2125   And  . sodium chloride 0.9 % injection 3 mL  3 mL Intravenous PRN Cyndra Numbers, MD       And  . 0.9 %  sodium chloride infusion  250 mL Intravenous PRN Meagan Hunt, MD      . 0.9 %  sodium chloride infusion   Intravenous Continuous Leone Brand, NP 10 mL/hr at 08/01/11 1900    . acetaminophen (TYLENOL) tablet 650 mg  650 mg Oral BID Leone Brand, NP   650 mg at 08/01/11 2124  . acetaminophen (TYLENOL) tablet 650 mg  650 mg Oral Q4H PRN Leone Brand, NP      . albuterol (PROVENTIL) (5 MG/ML) 0.5% nebulizer solution 2.5 mg  2.5 mg Nebulization Once Purcell Nails, MD   2.5 mg at 08/01/11 1527  . allopurinol (ZYLOPRIM) tablet 100 mg  100 mg Oral Daily Leone Brand, NP   100 mg at 08/01/11 1054  . amLODipine (NORVASC) tablet 10 mg  10 mg Oral Daily Leone Brand, NP   10 mg at 08/01/11 1054  . aspirin chewable tablet 81 mg  81 mg Oral Daily Leone Brand, NP   81 mg at 08/01/11 1056  . colesevelam [facility]) tablet 1,875 mg  1,875 mg Oral BID WC Leone Brand, NP   1,875 mg at 08/01/11 1730  . ezetimibe (ZETIA) tablet 10 mg  10 mg Oral Daily Leone Brand, NP   10 mg at 08/01/11 1054  . furosemide (LASIX) tablet 40 mg  40 mg Oral BID Leone Brand, NP   40 mg at 08/01/11 1729  . glimepiride (AMARYL) tablet 1 mg  1 mg Oral QAC breakfast Leone Brand, NP   1 mg at 08/01/11 0818  . heparin ADULT infusion 100 units/ml (25000 units/250 ml)  900 Units/hr Intravenous Continuous Janice Coffin, RPH 9 mL/hr at 08/01/11 1900 9 mL/hr at 08/01/11 1900  . insulin aspart (novoLOG) injection 0-9 Units  0-9 Units Subcutaneous TID WC Leone Brand, NP   2 Units at 08/01/11 1731  . isosorbide mononitrate (IMDUR) 24 hr tablet 30 mg  30 mg Oral Daily Leone Brand, NP   30 mg at 08/01/11 1030  . levothyroxine (SYNTHROID, LEVOTHROID) tablet 100 mcg  100 mcg Oral QAC breakfast Leone Brand, NP   100 mcg at 08/01/11 1030  . LORazepam (ATIVAN) tablet 0.5 mg  0.5 mg Oral Q8H Leone Brand, NP   0.5 mg at 08/01/11 2125  . metoprolol (LOPRESSOR) tablet 50 mg  50 mg Oral BID Leone Brand, NP   50 mg at 08/01/11 2128  . nitroGLYCERIN (NITROSTAT) SL tablet 0.4 mg  0.4 mg Sublingual Q5 min PRN  Leone Brand, NP      . ondansetron [facility]) injection 4 mg  4 mg Intravenous Q6H PRN Leone Brand, NP      . pantoprazole (PROTONIX) EC tablet 40 mg  40 mg Oral Q1200 Leone Brand, NP   40 mg at 08/01/11 1301  . sodium bicarbonate tablet 650 mg  650 mg Oral Daily Leone Brand, NP   650 mg at 08/01/11 1055  . zolpidem (AMBIEN) tablet 5 mg  5 mg Oral QHS PRN Leone Brand, NP      . DISCONTD: heparin ADULT infusion 100 units/ml (25000 units/250 ml)  1,100 Units/hr Intravenous Continuous Colleen Can, PHARMD 11 mL/hr at 07/31/11 1600 1,100 Units/hr at 07/31/11 1600  . DISCONTD: nitroGLYCERIN (NITROGLYN) 2 % ointment 1 inch  1 inch Topical Q8H Leone Brand, NP   1 inch at 08/01/11 0631    PE: General appearance: alert, cooperative, no distress and Pt sleeping upon entering the room Lungs: Decreased BS on right. With bibasal rales. Heart: regular rate and rhythm, S1, S2 normal, no murmur, click, rub or gallop Extremities: No LEE Pulses: 2+  and symmetric radials 2+ left DP, 1+ right DP  Lab Results:   Basename 08/01/11 0522 07/31/11 0535 07/30/11 1152  WBC 5.1 5.2 5.9  HGB 12.9 12.4 13.5  HCT 40.7 39.0 41.7  PLT 203 194 210   BMET  Basename 08/01/11 0522 07/31/11 0535 07/30/11 1152  NA 145 142 139  K 3.6 3.9 3.9  CL 109 107 106  CO2 28 28 23   GLUCOSE 92 78 142*  BUN 22 19 21   CREATININE 1.47* 1.33* 1.40*  CALCIUM 10.2 10.4 10.4   PT/INR  Basename 07/30/11 1817  LABPROT 13.5  INR 1.01   Cholesterol  Basename 07/31/11 0535  CHOL 169   Assessment/Plan  Principal Problem:  *Chest pain at rest, possible cardiac. Active Problems:  CAD, multiple vessel, severe disease, no stents placed only angioplasty to mid RCA.   Dyslipidemia  Diabetes type 2, controlled  Episodic lightheadedness, with near syncope X 3  Racing heart beat, episodically  Plan:  BP HR stable and controlled.  CABG vs. PCI.   LOS: 3 days    HAGER,BRYAN W 08/02/2011 5:30 AM   I had a long talk with the patient & family this afternoon, examined her & reviewed the chart.   She is well known to me - h/o Inf STEMI with PTCA of p-mRCA due to presence of ostial LM disease.  during that hospitalization, it was decided to continue with med Rx, no stent to RCA - but no CABG due to concern for post-op recovery difficulties.   She has done well through December, but has been the the ER twice in the past week.   She is agreeable to proceeding with CABG, with the understanding that she is a high risk pt.    Holding Plavix for potential CABG on Friday.  In the interim - would consider increasing Imdur dose. On BB & CCB, statin & Hydralazine (in lieu of ACE-I / ARB)  She is stable for now with no further CP => her Angina & dyspnea is exertional.  At baseline, she is mobile, ambulates around the house - and is limited by dyspnea, & lately - angina. May need to increase the lasix with her having rales.  I feel that CABG is her best long-term option  for symptom relieve & improved quality of life.  She is aware of  the potential for complications & prolonged recovery. I appreciate Dr. Dennie Maizes input. Will re-consult Rehab.   Marykay Lex, M.D., M.S. [facility] [address] [place], [place]  [address]  [phone]  08/02/2011 6:08 PM

## 2011-08-02 NOTE — Progress Notes (Signed)
Physical medicine and rehabilitation consult in chart reviewed. Need to await plan from a cardiac standpoint of stenting versus high-risk CABG. Physical and occupational therapy yet to see patient. Please reconsult physical medicine rehabilitation when plan in place for appropriate followup.

## 2011-08-03 LAB — CBC
Hemoglobin: 12.3 g/dL (ref 12.0–15.0)
MCH: 26.6 pg (ref 26.0–34.0)
MCV: 81 fL (ref 78.0–100.0)
Platelets: 206 10*3/uL (ref 150–400)
RBC: 4.62 MIL/uL (ref 3.87–5.11)
WBC: 5.5 10*3/uL (ref 4.0–10.5)

## 2011-08-03 LAB — BASIC METABOLIC PANEL
CO2: 26 mEq/L (ref 19–32)
Calcium: 9.9 mg/dL (ref 8.4–10.5)
Chloride: 107 mEq/L (ref 96–112)
Glucose, Bld: 76 mg/dL (ref 70–99)
Sodium: 143 mEq/L (ref 135–145)

## 2011-08-03 LAB — GLUCOSE, CAPILLARY
Glucose-Capillary: 80 mg/dL (ref 70–99)
Glucose-Capillary: 79 mg/dL (ref 70–99)
Glucose-Capillary: 115 mg/dL — ABNORMAL HIGH (ref 70–99)
Glucose-Capillary: 106 mg/dL — ABNORMAL HIGH (ref 70–99)

## 2011-08-03 MED ORDER — ISOSORBIDE MONONITRATE ER 60 MG PO TB24
60.0000 mg | ORAL_TABLET | Freq: Every day | ORAL | Status: DC
  Administered 2011-08-03 – 2011-08-04 (×2): 60 mg via ORAL
  Filled 2011-08-03 (×4): qty 1

## 2011-08-03 NOTE — Progress Notes (Signed)
ANTICOAGULATION CONSULT NOTE - Follow Up Consult  Pharmacy Consult for heparin Indication: ACS/awaiting CABG  Assessment: 89yof s/p cath with angioplasty to RCA only. Multivessel CAD awaiting CABG, currently at goal on IV heparin (0.34) no bleeding issues noted.  Goal of Therapy:  Heparin level 0.3-0.7 units/ml   Plan:  Continue IV heparin at 900 units/hr Possible CABG 08/05/11   Allergies  Allergen Reactions  . Statins Rash    Patient Measurements: Height: 5' 1.81" (157 cm) (from 05/27/11) Weight: 200 lb 3.2 oz (90.81 kg) IBW/kg (Calculated) : 49.67    Vital Signs: Temp: 98.3 F (36.8 C) (01/23 0745) Temp src: Oral (01/23 0745) BP: 143/51 mmHg (01/23 0745) Pulse Rate: 70  (01/23 0745)  Labs:  Basename 08/03/11 0550 08/02/11 0610 08/01/11 1610 08/01/11 0522  HGB 12.3 12.8 -- --  HCT 37.4 40.6 -- 40.7  PLT 206 217 -- 203  APTT -- -- -- --  LABPROT -- -- -- --  INR -- -- -- --  HEPARINUNFRC 0.34 0.49 0.68 --  CREATININE 1.59* 1.61* -- 1.47*  CKTOTAL -- -- -- --  CKMB -- -- -- --  TROPONINI -- -- -- --   Estimated Creatinine Clearance: 25.5 ml/min (by C-G formula based on Cr of 1.59).   Medications:  Infusions:    . sodium chloride 10 mL/hr at 08/01/11 1900  . heparin 900 Units/hr (08/03/11 0344)   [name] 08/03/2011,11:46 AM

## 2011-08-03 NOTE — Progress Notes (Signed)
Subjective:  Pt rested well with out c/o of DOE and or CP sx.  Objective:  Vital Signs in the last 24 hours: Temp:  [97.7 F (36.5 C)-98.4 F (36.9 C)] 98.4 F (36.9 C) (01/23 0400) Pulse Rate:  [58-75] 75  (01/22 2146) Resp:  [16-20] 17  (01/22 1930) BP: (113-138)/(49-81) 125/81 mmHg (01/23 0400) SpO2:  [90 %-96 %] 95 % (01/23 0400) Weight:  [90.81 kg (200 lb 3.2 oz)] 90.81 kg (200 lb 3.2 oz) (01/23 0500)  Intake/Output from previous day: 01/22 0701 - 01/23 0700 In: 1320.7 [P.O.:920; I.V.:400.7] Out: 1600 [Urine:1600] Intake/Output from this shift: Total I/O In: 271.7 [P.O.:80; I.V.:191.7] Out: 350 [Urine:350]    . acetaminophen  650 mg Oral BID  . allopurinol  100 mg Oral Daily  . amLODipine  10 mg Oral Daily  . aspirin  81 mg Oral Daily  . colesevelam  1,875 mg Oral BID WC  . ezetimibe  10 mg Oral Daily  . furosemide  40 mg Oral BID  . glimepiride  1 mg Oral QAC breakfast  . insulin aspart  0-9 Units Subcutaneous TID WC  . isosorbide mononitrate  30 mg Oral Daily  . levothyroxine  100 mcg Oral QAC breakfast  . LORazepam  0.5 mg Oral Q8H  . metoprolol  50 mg Oral BID  . pantoprazole  40 mg Oral Q1200  . sodium bicarbonate  650 mg Oral Daily  . sodium chloride  3 mL Intravenous Q12H   Physical Exam: [place]/AT No jvd Slightly decreased BS at bases RRR 1/6 sem Abd soft BS+ No sig edema nonfocal neuro  Lab Results: Results for orders placed during the hospital encounter of 07/30/11 (from the past 24 hour(s))  GLUCOSE, CAPILLARY     Status: Abnormal   Collection Time   08/02/11 11:38 AM      Component Value Range   Glucose-Capillary 104 (*) 70 - 99 (mg/dL)  GLUCOSE, CAPILLARY     Status: Abnormal   Collection Time   08/02/11  4:12 PM      Component Value Range   Glucose-Capillary 102 (*) 70 - 99 (mg/dL)  GLUCOSE, CAPILLARY     Status: Normal   Collection Time   08/02/11  9:20 PM      Component Value Range   Glucose-Capillary 99  70 - 99 (mg/dL)  HEPARIN LEVEL  (UNFRACTIONATED)     Status: Normal   Collection Time   08/03/11  5:50 AM      Component Value Range   Heparin Unfractionated 0.34  0.30 - 0.70 (IU/mL)  CBC     Status: Normal   Collection Time   08/03/11  5:50 AM      Component Value Range   WBC 5.5  4.0 - 10.5 (K/uL)   RBC 4.62  3.87 - 5.11 (MIL/uL)   Hemoglobin 12.3  12.0 - 15.0 (g/dL)   HCT 45.4  09.8 - 11.9 (%)   MCV 81.0  78.0 - 100.0 (fL)   MCH 26.6  26.0 - 34.0 (pg)   MCHC 32.9  30.0 - 36.0 (g/dL)   RDW 14.7  82.9 - 56.2 (%)   Platelets 206  150 - 400 (K/uL)  BASIC METABOLIC PANEL     Status: Abnormal   Collection Time   08/03/11  5:50 AM      Component Value Range   Sodium 143  135 - 145 (mEq/L)   Potassium 3.9  3.5 - 5.1 (mEq/L)   Chloride 107  96 -  112 (mEq/L)   CO2 26  19 - 32 (mEq/L)   Glucose, Bld 76  70 - 99 (mg/dL)   BUN 29 (*) 6 - 23 (mg/dL)   Creatinine, Ser 1.61 (*) 0.50 - 1.10 (mg/dL)   Calcium 9.9  8.4 - 09.6 (mg/dL)   GFR calc non Af Amer 28 (*) >90 (mL/min)   GFR calc Af Amer 32 (*) >90 (mL/min)    Basename 08/02/11 0610 08/01/11 0522  WBC 6.8 5.1  HGB 12.8 12.9  PLT 217 203    Basename 08/02/11 0610 08/01/11 0522  NA 143 145  K 3.7 3.6  CL 107 109  CO2 26 28  GLUCOSE 78 92  BUN 27* 22  CREATININE 1.61* 1.47*    Basename 07/31/11 0844  TROPONINI <0.30   Hepatic Function Panel No results found for this basename: PROT,ALBUMIN,AST,ALT,ALKPHOS,BILITOT,BILIDIR,IBILI in the last 72 hours  Basename 07/31/11 0535  CHOL 169   No results found for this basename: PROTIME in the last 72 hours  Imaging: Imaging results have been reviewed  Cardiac Studies:  Assessment/Plan:  1) Multi Vessel CAD, anticipated CABG Friday 2)HLD 3)DM2 4)HTN 5)CRI 6)Gout  LOS: 4 days    SIMON,SPENCER E 08/03/2011, 5:27 AM    Patient seen and examined. Agree with assessment and plan. Cr slightly improved today at 1.59. No angina on IV heparin.  Will increase nitrates. CABG on Friday.   Lennette Bihari, MD, [facility] 08/03/2011 8:34 AM

## 2011-08-04 ENCOUNTER — Inpatient Hospital Stay (HOSPITAL_COMMUNITY): Payer: Medicare Other

## 2011-08-04 DIAGNOSIS — I2510 Atherosclerotic heart disease of native coronary artery without angina pectoris: Secondary | ICD-10-CM

## 2011-08-04 LAB — BASIC METABOLIC PANEL
BUN: 29 mg/dL — ABNORMAL HIGH (ref 6–23)
CO2: 23 mEq/L (ref 19–32)
Calcium: 10.4 mg/dL (ref 8.4–10.5)
Creatinine, Ser: 1.67 mg/dL — ABNORMAL HIGH (ref 0.50–1.10)
GFR calc Af Amer: 32 mL/min — ABNORMAL LOW (ref >90)
GFR calc non Af Amer: 26 mL/min — ABNORMAL LOW (ref >90)
GFR calc non Af Amer: 27 mL/min — ABNORMAL LOW (ref >90)
Glucose, Bld: 81 mg/dL (ref 70–99)
Potassium: 4.2 mEq/L (ref 3.5–5.1)
Sodium: 142 mEq/L (ref 135–145)
Sodium: 138 mEq/L (ref 135–145)

## 2011-08-04 LAB — ABO/RH: ABO/RH(D): B POS

## 2011-08-04 LAB — CBC
Hemoglobin: 12.7 g/dL (ref 12.0–15.0)
Platelets: 187 10*3/uL (ref 150–400)
RBC: 4.48 MIL/uL (ref 3.87–5.11)
RBC: 4.83 MIL/uL (ref 3.87–5.11)
RDW: 15.5 % (ref 11.5–15.5)
WBC: 5.5 10*3/uL (ref 4.0–10.5)

## 2011-08-04 LAB — GLUCOSE, CAPILLARY
Glucose-Capillary: 82 mg/dL (ref 70–99)
Glucose-Capillary: 102 mg/dL — ABNORMAL HIGH (ref 70–99)

## 2011-08-04 LAB — HEPARIN LEVEL (UNFRACTIONATED): Heparin Unfractionated: 0.23 IU/mL — ABNORMAL LOW (ref 0.30–0.70)

## 2011-08-04 LAB — PREPARE RBC (CROSSMATCH)

## 2011-08-04 MED ORDER — DEXTROSE 5 % IV SOLN
750.0000 mg | INTRAVENOUS | Status: DC
  Filled 2011-08-04 (×2): qty 750

## 2011-08-04 MED ORDER — MAGNESIUM SULFATE 50 % IJ SOLN
40.0000 meq | INTRAMUSCULAR | Status: DC
  Filled 2011-08-04: qty 10

## 2011-08-04 MED ORDER — DEXTROSE 5 % IV SOLN
1.5000 g | INTRAVENOUS | Status: AC
  Administered 2011-08-05: .75 g via INTRAVENOUS
  Administered 2011-08-05: 1.5 g via INTRAVENOUS
  Filled 2011-08-04: qty 1.5

## 2011-08-04 MED ORDER — SODIUM CHLORIDE 0.9 % IV SOLN
INTRAVENOUS | Status: DC
  Filled 2011-08-04: qty 40

## 2011-08-04 MED ORDER — CHLORHEXIDINE GLUCONATE 4 % EX LIQD
60.0000 mL | Freq: Once | CUTANEOUS | Status: AC
  Administered 2011-08-04: 4 via TOPICAL
  Filled 2011-08-04: qty 60

## 2011-08-04 MED ORDER — NITROGLYCERIN IN D5W 200-5 MCG/ML-% IV SOLN
2.0000 ug/min | INTRAVENOUS | Status: DC
  Filled 2011-08-04 (×2): qty 250

## 2011-08-04 MED ORDER — CHLORHEXIDINE GLUCONATE 4 % EX LIQD: 60.0000 mL | Freq: Once | CUTANEOUS | Status: DC

## 2011-08-04 MED ORDER — VANCOMYCIN HCL 1000 MG IV SOLR
1500.0000 mg | INTRAVENOUS | Status: AC
  Administered 2011-08-05: 1500 mg via INTRAVENOUS
  Filled 2011-08-04: qty 1500

## 2011-08-04 MED ORDER — PHENYLEPHRINE HCL 10 MG/ML IJ SOLN
30.0000 ug/min | INTRAVENOUS | Status: DC
  Filled 2011-08-04: qty 2

## 2011-08-04 MED ORDER — CHLORHEXIDINE GLUCONATE 4 % EX LIQD
60.0000 mL | Freq: Once | CUTANEOUS | Status: AC
  Administered 2011-08-05: 4 via TOPICAL
  Filled 2011-08-04: qty 60

## 2011-08-04 MED ORDER — METOPROLOL TARTRATE 12.5 MG HALF TABLET
12.5000 mg | ORAL_TABLET | Freq: Once | ORAL | Status: AC
  Administered 2011-08-05: 12.5 mg via ORAL
  Filled 2011-08-04: qty 1

## 2011-08-04 MED ORDER — POTASSIUM CHLORIDE 2 MEQ/ML IV SOLN
80.0000 meq | INTRAVENOUS | Status: DC
  Filled 2011-08-04: qty 40

## 2011-08-04 MED ORDER — DOPAMINE-DEXTROSE 3.2-5 MG/ML-% IV SOLN
2.0000 ug/kg/min | INTRAVENOUS | Status: DC
  Filled 2011-08-04 (×2): qty 250

## 2011-08-04 MED ORDER — SODIUM CHLORIDE 0.9 % IV SOLN
0.1000 ug/kg/h | INTRAVENOUS | Status: DC
  Filled 2011-08-04: qty 4

## 2011-08-04 MED ORDER — VERAPAMIL HCL 2.5 MG/ML IV SOLN
INTRAVENOUS | Status: AC
  Administered 2011-08-05: 08:00:00
  Filled 2011-08-04 (×3): qty 2.5

## 2011-08-04 MED ORDER — EPINEPHRINE HCL 1 MG/ML IJ SOLN
0.5000 ug/min | INTRAVENOUS | Status: DC
  Filled 2011-08-04: qty 4

## 2011-08-04 MED ORDER — SODIUM CHLORIDE 0.9 % IV SOLN
INTRAVENOUS | Status: DC
  Filled 2011-08-04: qty 1

## 2011-08-04 NOTE — Progress Notes (Signed)
ANTICOAGULATION CONSULT NOTE - Follow Up Consult  Pharmacy Consult for heparin Indication: ACS/awaiting CABG  Assessment: 78 yo female s/p cath with angioplasty to RCA only. Plan CABG for multivessel CAD on Friday. Heparin level is below-goal.  No problem with infusion per RN.  Goal of Therapy:  Heparin level 0.3-0.7 units/ml   Plan:  1. Increase IV heparin to 1050 units/hr. 2. Heparin level in 8 hours.  3. Possible CABG 08/05/11   Allergies  Allergen Reactions  . Statins Rash    Patient Measurements: Height: 5' 1.81" (157 cm) (from 05/27/11) Weight: 200 lb 3.2 oz (90.81 kg) IBW/kg (Calculated) : 49.67    Vital Signs: Temp: 97.7 F (36.5 C) (01/24 0402) Temp src: Oral (01/24 0402) BP: 136/52 mmHg (01/24 0400) Pulse Rate: 73  (01/23 2127)  Labs:  Basename 08/04/11 0530 08/03/11 0550 08/02/11 0610  HGB 11.9* 12.3 --  HCT 36.6 37.4 40.6  PLT 187 206 217  APTT -- -- --  LABPROT -- -- --  INR -- -- --  HEPARINUNFRC 0.23* 0.34 0.49  CREATININE -- 1.59* 1.61*  CKTOTAL -- -- --  CKMB -- -- --  TROPONINI -- -- --   Estimated Creatinine Clearance: 25.5 ml/min (by C-G formula based on Cr of 1.59).   Medications:  Infusions:     . sodium chloride 10 mL/hr at 08/01/11 1900  . heparin 900 Units/hr (08/03/11 0344)   Emeline Gins 08/04/2011,6:17 AM

## 2011-08-04 NOTE — Progress Notes (Signed)
Subjective:  No acute concerns overnight, pt without cp and or DOE sx. Slated for CABG Friday. On IV heparin.  Objective:  Vital Signs in the last 24 hours: Temp:  [97.5 F (36.4 C)-98.3 F (36.8 C)] 97.7 F (36.5 C) (01/24 0402) Pulse Rate:  [62-74] 73  (01/23 2127) Resp:  [16] 16  (01/24 0000) BP: (124-143)/(51-62) 136/52 mmHg (01/24 0400) SpO2:  [94 %-99 %] 95 % (01/24 0402)  Intake/Output from previous day: 01/23 0701 - 01/24 0700 In: 1098 [P.O.:680; I.V.:418] Out: 1050 [Urine:800; Stool:250] Intake/Output from this shift: Total I/O In: 289 [P.O.:80; I.V.:209] Out: 250 [Stool:250]     . acetaminophen  650 mg Oral BID  . allopurinol  100 mg Oral Daily  . amLODipine  10 mg Oral Daily  . aspirin  81 mg Oral Daily  . colesevelam  1,875 mg Oral BID WC  . ezetimibe  10 mg Oral Daily  . furosemide  40 mg Oral BID  . glimepiride  1 mg Oral QAC breakfast  . insulin aspart  0-9 Units Subcutaneous TID WC  . isosorbide mononitrate  60 mg Oral Daily  . levothyroxine  100 mcg Oral QAC breakfast  . LORazepam  0.5 mg Oral Q8H  . metoprolol  50 mg Oral BID  . pantoprazole  40 mg Oral Q1200  . sodium bicarbonate  650 mg Oral Daily  . sodium chloride  3 mL Intravenous Q12H   Physical Exam: [place]/AT No JVD Faint basilar rales RRR 1/6 sem Abd soft Trace edema   Lab Results: Results for orders placed during the hospital encounter of 07/30/11 (from the past 24 hour(s))  GLUCOSE, CAPILLARY     Status: Normal   Collection Time   08/03/11 12:42 PM      Component Value Range   Glucose-Capillary 79  70 - 99 (mg/dL)  GLUCOSE, CAPILLARY     Status: Abnormal   Collection Time   08/03/11  5:01 PM      Component Value Range   Glucose-Capillary 115 (*) 70 - 99 (mg/dL)  GLUCOSE, CAPILLARY     Status: Abnormal   Collection Time   08/03/11  9:35 PM      Component Value Range   Glucose-Capillary 106 (*) 70 - 99 (mg/dL)   Comment 1 Documented in Chart     Comment 2 Notify RN    HEPARIN  LEVEL (UNFRACTIONATED)     Status: Abnormal   Collection Time   08/04/11  5:30 AM      Component Value Range   Heparin Unfractionated 0.23 (*) 0.30 - 0.70 (IU/mL)  CBC     Status: Abnormal   Collection Time   08/04/11  5:30 AM      Component Value Range   WBC 5.5  4.0 - 10.5 (K/uL)   RBC 4.48  3.87 - 5.11 (MIL/uL)   Hemoglobin 11.9 (*) 12.0 - 15.0 (g/dL)   HCT 16.1  09.6 - 04.5 (%)   MCV 81.7  78.0 - 100.0 (fL)   MCH 26.6  26.0 - 34.0 (pg)   MCHC 32.5  30.0 - 36.0 (g/dL)   RDW 40.9  81.1 - 91.4 (%)   Platelets 187  150 - 400 (K/uL)  GLUCOSE, CAPILLARY     Status: Normal   Collection Time   08/04/11  7:45 AM      Component Value Range   Glucose-Capillary 78  70 - 99 (mg/dL)    Basename 78/29/56 2130 08/02/11 0610  WBC 5.5 6.8  HGB 12.3 12.8  PLT 206 217    Basename 08/03/11 0550 08/02/11 0610  NA 143 143  K 3.9 3.7  CL 107 107  CO2 26 26  GLUCOSE 76 78  BUN 29* 27*  CREATININE 1.59* 1.61*   No results found for this basename: TROPONINI:2,CK,MB:2 in the last 72 hours Hepatic Function Panel No results found for this basename: PROT,ALBUMIN,AST,ALT,ALKPHOS,BILITOT,BILIDIR,IBILI in the last 72 hours No results found for this basename: CHOL in the last 72 hours No results found for this basename: PROTIME in the last 72 hours  Imaging: Imaging results have been reviewed  Cardiac Studies:  Assessment/Plan:  1) Angina,(asymptomatic) with Multi -Vessel CAD, CABG Friday 2) HLD 3) DM2 4) h/o near syncope 5) Htn 6) Hypothyroidism 7)CKD  LOS: 5 days    [name],[name] E 08/04/2011, 5:49 AM     Patient seen and examined. Agree with assessment and plan.  No recurrent Chest pain.  For CABG in am. Will f/u renal profile.  Lennette Bihari, MD, [facility] 08/04/2011 8:44 AM

## 2011-08-04 NOTE — Progress Notes (Signed)
Patient ID: [name], female   DOB: 04-Dec-1920, 78 y.o.   MRN: [id number] TCTS DAILY PROGRESS NOTE                   [address]            [name] [address]          [phone]        Procedure(s) (LRB): CORONARY ARTERY BYPASS GRAFTING (CABG)  Planned for tomorrow  Total Length of Stay:  LOS: 5 days   Subjective: No chest pain today  Objective: Vital signs in last 24 hours: Temp:  [97.5 F (36.4 C)-98.2 F (36.8 C)] 98.2 F (36.8 C) (01/24 0743) Pulse Rate:  [62-74] 63  (01/24 0743) Cardiac Rhythm:  [-] Normal sinus rhythm (01/24 0743) Resp:  [16] 16  (01/24 0000) BP: (108-141)/(52-62) 108/60 mmHg (01/24 0743) SpO2:  [93 %-99 %] 93 % (01/24 0743)  Filed Weights   07/30/11 1641 08/03/11 0500  Weight: 227 lb 1.2 oz (103 kg) 200 lb 3.2 oz (90.81 kg)          Intake/Output from previous day: 01/23 0701 - 01/24 0700 In: 1137.5 [P.O.:680; I.V.:457.5] Out: 1500 [Urine:1500]  Intake/Output this shift: Total I/O In: 421.5 [P.O.:360; I.V.:61.5] Out: -   Current Meds: Scheduled Meds:   . acetaminophen  650 mg Oral BID  . allopurinol  100 mg Oral Daily  . amLODipine  10 mg Oral Daily  . aspirin  81 mg Oral Daily  . colesevelam  1,875 mg Oral BID WC  . ezetimibe  10 mg Oral Daily  . furosemide  40 mg Oral BID  . glimepiride  1 mg Oral QAC breakfast  . insulin aspart  0-9 Units Subcutaneous TID WC  . isosorbide mononitrate  60 mg Oral Daily  . levothyroxine  100 mcg Oral QAC breakfast  . LORazepam  0.5 mg Oral Q8H  . metoprolol  50 mg Oral BID  . pantoprazole  40 mg Oral Q1200  . sodium bicarbonate  650 mg Oral Daily  . sodium chloride  3 mL Intravenous Q12H   Continuous Infusions:   . sodium chloride 10 mL/hr at 08/01/11 1900  . heparin 1,050 Units/hr (08/04/11 0625)   PRN Meds:.sodium chloride, acetaminophen, nitroGLYCERIN, ondansetron (ZOFRAN) IV, sodium chloride, zolpidem  General appearance: alert, cooperative and no  distress Neurologic: intact Heart: regular rate and rhythm, S1, S2 normal, no murmur, click, rub or gallop Lungs: clear to auscultation bilaterally Abdomen: soft, non-tender; bowel sounds normal; no masses,  no organomegaly  Lab Results: CBC: Basename 08/04/11 0530 08/03/11 0550  WBC 5.5 5.5  HGB 11.9* 12.3  HCT 36.6 37.4  PLT 187 206   BMET:  Basename 08/03/11 0550 08/02/11 0610  NA 143 143  K 3.9 3.7  CL 107 107  CO2 26 26  GLUCOSE 76 78  BUN 29* 27*  CREATININE 1.59* 1.61*  CALCIUM 9.9 10.2    PT/INR: No results found for this basename: LABPROT,INR in the last 72 hours Radiology: No results found.   Assessment/Plan: S/P Procedure(s) (LRB): CORONARY ARTERY BYPASS GRAFTING (CABG)  Planned for tomorrow The following information about risk has been shared with patient and family STS score:  Mortality 7.4% (4 times average)  Morbidity or Mortality 38%  Long LOS 25%  Stroke 4.2%  Prolonged ventilation 26%  Sternal wound infection 1.4%  Renal Failure 21.2%  Reoperation 9.8% The goals risks and alternatives of the planned surgical procedure CABG  have been discussed with the patient in detail. The risks of the procedure including death, infection, stroke, myocardial infarction, bleeding, blood transfusion have all been discussed specifically.  I have quoted [name] above risks.  She is aware of the high risk also of medical therapy.  The patient's questions have been answered.[name] is willing  to proceed with the planned procedure.     [name] MD  Beeper [phone] Office [phone] 08/04/2011 10:46 AM

## 2011-08-04 NOTE — Progress Notes (Signed)
ANTICOAGULATION CONSULT NOTE - Follow Up Consult  Pharmacy Consult for heparin  Indication: ACS/awaiting CABG  Assessment: 89yof s/p cath with angioplasty to RCA only. Multivessel CAD awaiting CABG, currently at goal on IV heparin (0.37) no bleeding issues noted.  Goal of Therapy:  Heparin level 0.3-0.7 units/ml   Plan:  No changes to heparin rate CABG planned for 1/25  Labs:  Basename 08/04/11 1441 08/04/11 0904 08/04/11 0530 08/03/11 0550 08/02/11 0610  HGB -- -- 11.9* 12.3 --  HCT -- -- 36.6 37.4 40.6  PLT -- -- 187 206 217  APTT -- -- -- -- --  LABPROT -- -- -- -- --  INR -- -- -- -- --  HEPARINUNFRC 0.37 -- 0.23* 0.34 --  CREATININE -- 1.67* -- 1.59* 1.61*  CKTOTAL -- -- -- -- --  CKMB -- -- -- -- --  TROPONINI -- -- -- -- --   Estimated Creatinine Clearance: 24.3 ml/min (by C-G formula based on Cr of 1.67).   Severiano Gilbert 08/04/2011,3:28 PM

## 2011-08-05 ENCOUNTER — Other Ambulatory Visit: Payer: Self-pay

## 2011-08-05 ENCOUNTER — Inpatient Hospital Stay (HOSPITAL_COMMUNITY): Payer: Medicare Other

## 2011-08-05 ENCOUNTER — Encounter (HOSPITAL_COMMUNITY): Payer: Self-pay | Admitting: Anesthesiology

## 2011-08-05 ENCOUNTER — Encounter (HOSPITAL_COMMUNITY)
Admission: EM | Disposition: A | Discharge status: Home Health | Payer: Self-pay | Source: Home / Self Care | Attending: Cardiothoracic Surgery

## 2011-08-05 ENCOUNTER — Inpatient Hospital Stay (HOSPITAL_COMMUNITY): Payer: Medicare Other | Admitting: Anesthesiology

## 2011-08-05 DIAGNOSIS — Z951 Presence of aortocoronary bypass graft: Secondary | ICD-10-CM

## 2011-08-05 DIAGNOSIS — I2510 Atherosclerotic heart disease of native coronary artery without angina pectoris: Secondary | ICD-10-CM

## 2011-08-05 HISTORY — PX: CORONARY ARTERY BYPASS GRAFT: SHX141

## 2011-08-05 HISTORY — DX: Presence of aortocoronary bypass graft: Z95.1

## 2011-08-05 LAB — CBC
HCT: 25.7 % — ABNORMAL LOW (ref 36.0–46.0)
HCT: 23.5 % — ABNORMAL LOW (ref 36.0–46.0)
Hemoglobin: 7.9 g/dL — ABNORMAL LOW (ref 12.0–15.0)
MCH: 26.7 pg (ref 26.0–34.0)
MCH: 26.7 pg (ref 26.0–34.0)
MCHC: 32.7 g/dL (ref 30.0–36.0)
MCHC: 33.1 g/dL (ref 30.0–36.0)
MCHC: 33.6 g/dL (ref 30.0–36.0)
MCV: 79.4 fL (ref 78.0–100.0)
Platelets: 210 10*3/uL (ref 150–400)
Platelets: 112 10*3/uL — ABNORMAL LOW (ref 150–400)
RBC: 4.94 MIL/uL (ref 3.87–5.11)
RBC: 2.96 MIL/uL — ABNORMAL LOW (ref 3.87–5.11)
RDW: 15.2 % (ref 11.5–15.5)
RDW: 15 % (ref 11.5–15.5)
WBC: 16 10*3/uL — ABNORMAL HIGH (ref 4.0–10.5)

## 2011-08-05 LAB — POCT I-STAT 4, (NA,K, GLUC, HGB,HCT)
Glucose, Bld: 114 mg/dL — ABNORMAL HIGH (ref 70–99)
Glucose, Bld: 149 mg/dL — ABNORMAL HIGH (ref 70–99)
Glucose, Bld: 178 mg/dL — ABNORMAL HIGH (ref 70–99)
Glucose, Bld: 116 mg/dL — ABNORMAL HIGH (ref 70–99)
HCT: 31 % — ABNORMAL LOW (ref 36.0–46.0)
HCT: 22 % — ABNORMAL LOW (ref 36.0–46.0)
HCT: 26 % — ABNORMAL LOW (ref 36.0–46.0)
Hemoglobin: 10.5 g/dL — ABNORMAL LOW (ref 12.0–15.0)
Hemoglobin: 7.8 g/dL — ABNORMAL LOW (ref 12.0–15.0)
Hemoglobin: 7.5 g/dL — ABNORMAL LOW (ref 12.0–15.0)
Potassium: 3.6 mEq/L (ref 3.5–5.1)
Sodium: 142 mEq/L (ref 135–145)
Sodium: 137 mEq/L (ref 135–145)
Sodium: 138 mEq/L (ref 135–145)

## 2011-08-05 LAB — BLOOD GAS, ARTERIAL
Acid-Base Excess: 0.7 mmol/L (ref 0.0–2.0)
Bicarbonate: 25.5 mEq/L — ABNORMAL HIGH (ref 20.0–24.0)
Drawn by: 347791
FIO2: 0.21 %
O2 Saturation: 94.2 %
Patient temperature: 98.6
TCO2: 26.9 mmol/L (ref 0–100)
pCO2 arterial: 46.3 mmHg — ABNORMAL HIGH (ref 35.0–45.0)
pH, Arterial: 7.36 (ref 7.350–7.400)
pO2, Arterial: 69.2 mmHg — ABNORMAL LOW (ref 80.0–100.0)

## 2011-08-05 LAB — HEMOGLOBIN AND HEMATOCRIT, BLOOD
HCT: 21.3 % — ABNORMAL LOW (ref 36.0–46.0)
Hemoglobin: 7.1 g/dL — ABNORMAL LOW (ref 12.0–15.0)

## 2011-08-05 LAB — POCT I-STAT 3, ART BLOOD GAS (G3+)
Acid-base deficit: 2 mmol/L (ref 0.0–2.0)
Bicarbonate: 21.9 mEq/L (ref 20.0–24.0)
Patient temperature: 98.6
pCO2 arterial: 40.6 mmHg (ref 35.0–45.0)
pCO2 arterial: 38.1 mmHg (ref 35.0–45.0)
pH, Arterial: 7.393 (ref 7.350–7.400)
pH, Arterial: 7.351 (ref 7.350–7.400)

## 2011-08-05 LAB — CREATININE, SERUM
Creatinine, Ser: 1.2 mg/dL — ABNORMAL HIGH (ref 0.50–1.10)
GFR calc Af Amer: 45 mL/min — ABNORMAL LOW (ref >90)
GFR calc non Af Amer: 39 mL/min — ABNORMAL LOW (ref >90)

## 2011-08-05 LAB — BASIC METABOLIC PANEL
CO2: 26 mEq/L (ref 19–32)
Calcium: 10.2 mg/dL (ref 8.4–10.5)
GFR calc non Af Amer: 28 mL/min — ABNORMAL LOW (ref >90)
Sodium: 141 mEq/L (ref 135–145)

## 2011-08-05 LAB — PLATELET COUNT: Platelets: 115 10*3/uL — ABNORMAL LOW (ref 150–400)

## 2011-08-05 LAB — POCT I-STAT, CHEM 8
BUN: 21 mg/dL (ref 6–23)
Calcium, Ion: 1.39 mmol/L — ABNORMAL HIGH (ref 1.12–1.32)
Creatinine, Ser: 1.4 mg/dL — ABNORMAL HIGH (ref 0.50–1.10)
TCO2: 22 mmol/L (ref 0–100)

## 2011-08-05 LAB — HEPARIN LEVEL (UNFRACTIONATED): Heparin Unfractionated: 0.59 IU/mL (ref 0.30–0.70)

## 2011-08-05 LAB — PROTIME-INR: INR: 1.72 — ABNORMAL HIGH (ref 0.00–1.49)

## 2011-08-05 LAB — MAGNESIUM: Magnesium: 2.2 mg/dL (ref 1.5–2.5)

## 2011-08-05 SURGERY — CORONARY ARTERY BYPASS GRAFTING (CABG)
Anesthesia: General | Site: Chest | Wound class: Clean

## 2011-08-05 MED ORDER — PANTOPRAZOLE SODIUM 40 MG PO TBEC
40.0000 mg | DELAYED_RELEASE_TABLET | Freq: Every day | ORAL | Status: DC
  Administered 2011-08-07 – 2011-08-10 (×4): 40 mg via ORAL
  Filled 2011-08-05 (×4): qty 1

## 2011-08-05 MED ORDER — PHENYLEPHRINE HCL 10 MG/ML IJ SOLN
0.0000 ug/min | INTRAVENOUS | Status: DC
  Administered 2011-08-06: 40 ug/min via INTRAVENOUS
  Filled 2011-08-05 (×6): qty 2

## 2011-08-05 MED ORDER — ASPIRIN 81 MG PO CHEW: 324.0000 mg | CHEWABLE_TABLET | Freq: Every day | ORAL | Status: DC

## 2011-08-05 MED ORDER — HETASTARCH-ELECTROLYTES 6 % IV SOLN
INTRAVENOUS | Status: DC | PRN
  Administered 2011-08-05: 13:00:00 via INTRAVENOUS

## 2011-08-05 MED ORDER — METOPROLOL TARTRATE 1 MG/ML IV SOLN: 2.5000 mg | INTRAVENOUS | Status: DC | PRN

## 2011-08-05 MED ORDER — DOPAMINE-DEXTROSE 3.2-5 MG/ML-% IV SOLN
1.5000 ug/kg/min | INTRAVENOUS | Status: DC
  Administered 2011-08-09: 3 ug/kg/min via INTRAVENOUS
  Filled 2011-08-05 (×3): qty 250

## 2011-08-05 MED ORDER — FENTANYL CITRATE 0.05 MG/ML IJ SOLN
INTRAMUSCULAR | Status: DC | PRN
  Administered 2011-08-05: 325 ug via INTRAVENOUS
  Administered 2011-08-05: 400 ug via INTRAVENOUS
  Administered 2011-08-05: 25 ug via INTRAVENOUS
  Administered 2011-08-05: 50 ug via INTRAVENOUS

## 2011-08-05 MED ORDER — 0.9 % SODIUM CHLORIDE (POUR BTL) OPTIME
TOPICAL | Status: DC | PRN
  Administered 2011-08-05: 5000 mL

## 2011-08-05 MED ORDER — SODIUM CHLORIDE 0.9 % IV SOLN
0.1000 ug/kg/h | INTRAVENOUS | Status: DC
  Filled 2011-08-05: qty 2

## 2011-08-05 MED ORDER — SODIUM CHLORIDE 0.9 % IV SOLN
10.0000 g | INTRAVENOUS | Status: DC | PRN
  Administered 2011-08-05: 5 g/h via INTRAVENOUS

## 2011-08-05 MED ORDER — SODIUM CHLORIDE 0.9 % IV SOLN
100.0000 [IU] | INTRAVENOUS | Status: DC | PRN
  Administered 2011-08-05: 1 [IU]/h via INTRAVENOUS

## 2011-08-05 MED ORDER — ACETAMINOPHEN 160 MG/5ML PO SOLN: 650.0000 mg | ORAL | Status: AC

## 2011-08-05 MED ORDER — INSULIN REGULAR BOLUS VIA INFUSION
0.0000 [IU] | Freq: Three times a day (TID) | INTRAVENOUS | Status: DC
Start: 2011-08-05 — End: 2011-08-06
  Filled 2011-08-05 (×5): qty 10

## 2011-08-05 MED ORDER — DOCUSATE SODIUM 100 MG PO CAPS
200.0000 mg | ORAL_CAPSULE | Freq: Every day | ORAL | Status: DC
  Administered 2011-08-07 – 2011-08-10 (×4): 200 mg via ORAL
  Filled 2011-08-05 (×5): qty 2

## 2011-08-05 MED ORDER — DEXTROSE 5 % IV SOLN
1.5000 g | Freq: Two times a day (BID) | INTRAVENOUS | Status: AC
  Administered 2011-08-05 – 2011-08-07 (×4): 1.5 g via INTRAVENOUS
  Filled 2011-08-05 (×4): qty 1.5

## 2011-08-05 MED ORDER — DEXTROSE 5 % IV SOLN
150.0000 mg | INTRAVENOUS | Status: DC | PRN
  Administered 2011-08-05: 150 mg via INTRAVENOUS

## 2011-08-05 MED ORDER — ACETAMINOPHEN 650 MG RE SUPP
650.0000 mg | RECTAL | Status: AC
  Administered 2011-08-05: 650 mg via RECTAL

## 2011-08-05 MED ORDER — HEPARIN SODIUM (PORCINE) 1000 UNIT/ML IJ SOLN
INTRAMUSCULAR | Status: DC | PRN
  Administered 2011-08-05: 5000 [IU] via INTRAVENOUS
  Administered 2011-08-05: 28000 [IU] via INTRAVENOUS

## 2011-08-05 MED ORDER — SODIUM CHLORIDE 0.9 % IV SOLN: INTRAVENOUS | Status: DC

## 2011-08-05 MED ORDER — DEXTROSE 5 % IV SOLN: 30.0000 mg/h | INTRAVENOUS | Status: DC

## 2011-08-05 MED ORDER — FUROSEMIDE 10 MG/ML IJ SOLN
40.0000 mg | Freq: Once | INTRAMUSCULAR | Status: AC
  Administered 2011-08-05: 40 mg via INTRAVENOUS
  Filled 2011-08-05: qty 4

## 2011-08-05 MED ORDER — LACTATED RINGERS IV SOLN
INTRAVENOUS | Status: DC
  Administered 2011-08-07: 20 mL/h via INTRAVENOUS

## 2011-08-05 MED ORDER — SODIUM CHLORIDE 0.9 % IV SOLN
200.0000 ug | INTRAVENOUS | Status: DC | PRN
  Administered 2011-08-05: 0.3 ug/kg/h via INTRAVENOUS

## 2011-08-05 MED ORDER — NITROGLYCERIN IN D5W 200-5 MCG/ML-% IV SOLN
0.0000 ug/min | INTRAVENOUS | Status: DC
  Filled 2011-08-05: qty 250

## 2011-08-05 MED ORDER — AMIODARONE HCL IN DEXTROSE 360-4.14 MG/200ML-% IV SOLN
30.0000 mg/h | INTRAVENOUS | Status: DC
  Filled 2011-08-05 (×3): qty 200

## 2011-08-05 MED ORDER — ROCURONIUM BROMIDE 100 MG/10ML IV SOLN
INTRAVENOUS | Status: DC | PRN
  Administered 2011-08-05: 70 mg via INTRAVENOUS
  Administered 2011-08-05: 30 mg via INTRAVENOUS

## 2011-08-05 MED ORDER — SODIUM CHLORIDE 0.9 % IJ SOLN
3.0000 mL | Freq: Two times a day (BID) | INTRAMUSCULAR | Status: DC
  Administered 2011-08-06 – 2011-08-10 (×8): 3 mL via INTRAVENOUS

## 2011-08-05 MED ORDER — OXYCODONE HCL 5 MG PO TABS: 5.0000 mg | ORAL_TABLET | ORAL | Status: DC | PRN

## 2011-08-05 MED ORDER — VECURONIUM BROMIDE 10 MG IV SOLR
INTRAVENOUS | Status: DC | PRN
  Administered 2011-08-05 (×2): 5 mg via INTRAVENOUS

## 2011-08-05 MED ORDER — ACETAMINOPHEN 160 MG/5ML PO SOLN
975.0000 mg | Freq: Four times a day (QID) | ORAL | Status: AC
  Administered 2011-08-05 – 2011-08-10 (×4): 975 mg
  Filled 2011-08-05: qty 40.6
  Filled 2011-08-05 (×2): qty 20.3
  Filled 2011-08-05 (×2): qty 40.6

## 2011-08-05 MED ORDER — PHENYLEPHRINE HCL 10 MG/ML IJ SOLN
20.0000 mg | INTRAVENOUS | Status: DC | PRN
  Administered 2011-08-05: 25 ug/min via INTRAVENOUS

## 2011-08-05 MED ORDER — SODIUM CHLORIDE 0.9 % IJ SOLN: 3.0000 mL | INTRAMUSCULAR | Status: DC | PRN

## 2011-08-05 MED ORDER — PROTAMINE SULFATE 10 MG/ML IV SOLN
INTRAVENOUS | Status: DC | PRN
  Administered 2011-08-05: 280 mg via INTRAVENOUS

## 2011-08-05 MED ORDER — BISACODYL 10 MG RE SUPP: 10.0000 mg | Freq: Every day | RECTAL | Status: DC

## 2011-08-05 MED ORDER — MIDAZOLAM HCL 2 MG/2ML IJ SOLN: 2.0000 mg | INTRAMUSCULAR | Status: DC | PRN

## 2011-08-05 MED ORDER — VANCOMYCIN HCL 1000 MG IV SOLR
1000.0000 mg | Freq: Once | INTRAVENOUS | Status: AC
  Administered 2011-08-05: 1000 mg via INTRAVENOUS
  Filled 2011-08-05: qty 1000

## 2011-08-05 MED ORDER — SODIUM CHLORIDE 0.9 % IJ SOLN
OROMUCOSAL | Status: DC | PRN
  Administered 2011-08-05 (×2): via TOPICAL

## 2011-08-05 MED ORDER — FAMOTIDINE IN NACL 20-0.9 MG/50ML-% IV SOLN
20.0000 mg | Freq: Two times a day (BID) | INTRAVENOUS | Status: AC
  Administered 2011-08-05 – 2011-08-06 (×3): 20 mg via INTRAVENOUS
  Filled 2011-08-05: qty 50

## 2011-08-05 MED ORDER — SODIUM CHLORIDE 0.9 % IV SOLN
INTRAVENOUS | Status: DC | PRN
  Administered 2011-08-05 (×2): via INTRAVENOUS

## 2011-08-05 MED ORDER — LACTATED RINGERS IV SOLN: 500.0000 mL | Freq: Once | INTRAVENOUS | Status: AC | PRN

## 2011-08-05 MED ORDER — INSULIN ASPART 100 UNIT/ML ~~LOC~~ SOLN
0.0000 [IU] | SUBCUTANEOUS | Status: DC
  Administered 2011-08-06 (×2): 2 [IU] via SUBCUTANEOUS

## 2011-08-05 MED ORDER — INSULIN REGULAR HUMAN 100 UNIT/ML IJ SOLN
INTRAMUSCULAR | Status: DC
  Filled 2011-08-05: qty 1

## 2011-08-05 MED ORDER — BISACODYL 5 MG PO TBEC
10.0000 mg | DELAYED_RELEASE_TABLET | Freq: Every day | ORAL | Status: DC
  Administered 2011-08-07 – 2011-08-10 (×4): 10 mg via ORAL
  Filled 2011-08-05 (×5): qty 2

## 2011-08-05 MED ORDER — POTASSIUM CHLORIDE 10 MEQ/50ML IV SOLN
10.0000 meq | INTRAVENOUS | Status: AC
  Administered 2011-08-05: 10 meq via INTRAVENOUS

## 2011-08-05 MED ORDER — NITROGLYCERIN IN D5W 200-5 MCG/ML-% IV SOLN
INTRAVENOUS | Status: DC | PRN
  Administered 2011-08-05: 5 ug/min via INTRAVENOUS

## 2011-08-05 MED ORDER — MORPHINE SULFATE 4 MG/ML IJ SOLN
2.0000 mg | INTRAMUSCULAR | Status: DC | PRN
  Administered 2011-08-05 – 2011-08-06 (×3): 2 mg via INTRAVENOUS
  Filled 2011-08-05 (×2): qty 1

## 2011-08-05 MED ORDER — MIDAZOLAM HCL 5 MG/5ML IJ SOLN
INTRAMUSCULAR | Status: DC | PRN
  Administered 2011-08-05: 1 mg via INTRAVENOUS
  Administered 2011-08-05: 5 mg via INTRAVENOUS
  Administered 2011-08-05: 2 mg via INTRAVENOUS
  Administered 2011-08-05: 3 mg via INTRAVENOUS
  Administered 2011-08-05: 1 mg via INTRAVENOUS

## 2011-08-05 MED ORDER — MORPHINE SULFATE 2 MG/ML IJ SOLN
1.0000 mg | INTRAMUSCULAR | Status: AC | PRN
  Filled 2011-08-05: qty 1

## 2011-08-05 MED ORDER — ALBUMIN HUMAN 5 % IV SOLN
250.0000 mL | INTRAVENOUS | Status: AC | PRN
  Administered 2011-08-05 (×3): 250 mL via INTRAVENOUS
  Filled 2011-08-05: qty 250

## 2011-08-05 MED ORDER — INSULIN ASPART 100 UNIT/ML ~~LOC~~ SOLN
0.0000 [IU] | SUBCUTANEOUS | Status: DC
  Administered 2011-08-05 – 2011-08-06 (×3): 2 [IU] via SUBCUTANEOUS
  Filled 2011-08-05: qty 3

## 2011-08-05 MED ORDER — SODIUM CHLORIDE 0.9 % IV SOLN: 250.0000 mL | INTRAVENOUS | Status: DC

## 2011-08-05 MED ORDER — METOPROLOL TARTRATE 12.5 MG HALF TABLET
12.5000 mg | ORAL_TABLET | Freq: Two times a day (BID) | ORAL | Status: DC
  Filled 2011-08-05 (×3): qty 1

## 2011-08-05 MED ORDER — SODIUM CHLORIDE 0.45 % IV SOLN
INTRAVENOUS | Status: DC
  Administered 2011-08-05 – 2011-08-06 (×2): via INTRAVENOUS

## 2011-08-05 MED ORDER — DEXTROSE 5 % IV SOLN
450.0000 mg | INTRAVENOUS | Status: DC | PRN
  Administered 2011-08-05: 60 mg/h via INTRAVENOUS

## 2011-08-05 MED ORDER — TRAMADOL HCL 50 MG PO TABS
50.0000 mg | ORAL_TABLET | Freq: Four times a day (QID) | ORAL | Status: DC | PRN
  Administered 2011-08-07: 50 mg via ORAL
  Filled 2011-08-05: qty 1

## 2011-08-05 MED ORDER — AMIODARONE HCL IN DEXTROSE 360-4.14 MG/200ML-% IV SOLN
60.0000 mg/h | INTRAVENOUS | Status: DC
  Filled 2011-08-05: qty 200

## 2011-08-05 MED ORDER — MAGNESIUM SULFATE 40 MG/ML IJ SOLN
4.0000 g | Freq: Once | INTRAMUSCULAR | Status: AC
  Administered 2011-08-05: 2 g via INTRAVENOUS
  Filled 2011-08-05: qty 100

## 2011-08-05 MED ORDER — HEMOSTATIC AGENTS (NO CHARGE) OPTIME
TOPICAL | Status: DC | PRN
  Administered 2011-08-05: 1 via TOPICAL

## 2011-08-05 MED ORDER — DEXTROSE 50 % IV SOLN
INTRAVENOUS | Status: AC
  Administered 2011-08-05: 13 mL via INTRAVENOUS
  Filled 2011-08-05: qty 50

## 2011-08-05 MED ORDER — DOPAMINE-DEXTROSE 3.2-5 MG/ML-% IV SOLN
INTRAVENOUS | Status: DC | PRN
  Administered 2011-08-05: 3 ug/kg/min via INTRAVENOUS

## 2011-08-05 MED ORDER — LACTATED RINGERS IV SOLN
INTRAVENOUS | Status: DC | PRN
  Administered 2011-08-05 (×2): via INTRAVENOUS

## 2011-08-05 MED ORDER — ACETAMINOPHEN 500 MG PO TABS
1000.0000 mg | ORAL_TABLET | Freq: Four times a day (QID) | ORAL | Status: AC
  Administered 2011-08-06 – 2011-08-10 (×12): 1000 mg via ORAL
  Filled 2011-08-05 (×22): qty 2

## 2011-08-05 MED ORDER — ONDANSETRON HCL 4 MG/2ML IJ SOLN
4.0000 mg | Freq: Four times a day (QID) | INTRAMUSCULAR | Status: DC | PRN
  Administered 2011-08-06 (×2): 4 mg via INTRAVENOUS
  Filled 2011-08-05 (×2): qty 2

## 2011-08-05 MED ORDER — ASPIRIN EC 325 MG PO TBEC
325.0000 mg | DELAYED_RELEASE_TABLET | Freq: Every day | ORAL | Status: DC
  Administered 2011-08-07 – 2011-08-10 (×4): 325 mg via ORAL
  Filled 2011-08-05 (×6): qty 1

## 2011-08-05 MED ORDER — LACTATED RINGERS IV SOLN
INTRAVENOUS | Status: DC | PRN
  Administered 2011-08-05 (×4): via INTRAVENOUS

## 2011-08-05 MED ORDER — METOPROLOL TARTRATE 25 MG/10 ML ORAL SUSPENSION
12.5000 mg | Freq: Two times a day (BID) | ORAL | Status: DC
  Filled 2011-08-05 (×3): qty 5

## 2011-08-05 MED ORDER — DEXTROSE 50 % IV SOLN
13.0000 mL | Freq: Once | INTRAVENOUS | Status: AC
  Administered 2011-08-05: 13 mL via INTRAVENOUS

## 2011-08-05 MED ORDER — PROPOFOL 10 MG/ML IV EMUL
INTRAVENOUS | Status: DC | PRN
  Administered 2011-08-05 (×2): 50 mg via INTRAVENOUS

## 2011-08-05 MED FILL — Nitroglycerin IV Soln 5 MG/ML: INTRAVENOUS | Qty: 10 | Status: AC

## 2011-08-05 MED FILL — Dexmedetomidine HCl IV Soln 200 MCG/2ML: INTRAVENOUS | Qty: 2 | Status: AC

## 2011-08-05 MED FILL — Lactated Ringer's Solution: INTRAVENOUS | Qty: 500 | Status: AC

## 2011-08-05 MED FILL — Magnesium Sulfate Inj 50%: INTRAMUSCULAR | Qty: 10 | Status: AC

## 2011-08-05 MED FILL — Potassium Chloride Inj 2 mEq/ML: INTRAVENOUS | Qty: 40 | Status: AC

## 2011-08-05 MED FILL — Verapamil HCl IV Soln 2.5 MG/ML: INTRAVENOUS | Qty: 4 | Status: AC

## 2011-08-05 MED FILL — Heparin Sodium (Porcine) Inj 1000 Unit/ML: INTRAMUSCULAR | Qty: 10 | Status: AC

## 2011-08-05 SURGICAL SUPPLY — 122 items
ATTRACTOMAT 16X20 MAGNETIC DRP (DRAPES) ×2 IMPLANT
BAG DECANTER FOR FLEXI CONT (MISCELLANEOUS) ×2 IMPLANT
BANDAGE ACE 4 STERILE (GAUZE/BANDAGES/DRESSINGS) ×1 IMPLANT
BANDAGE ELASTIC 4 VELCRO ST LF (GAUZE/BANDAGES/DRESSINGS) ×2 IMPLANT
BANDAGE ELASTIC 6 VELCRO ST LF (GAUZE/BANDAGES/DRESSINGS) ×2 IMPLANT
BANDAGE GAUZE ELAST BULKY 4 IN (GAUZE/BANDAGES/DRESSINGS) ×2 IMPLANT
BLADE SAW STERNAL (BLADE) ×2 IMPLANT
BLADE SURG ROTATE 9660 (MISCELLANEOUS) IMPLANT
CANISTER SUCTION 2500CC (MISCELLANEOUS) ×2 IMPLANT
CANN PRFSN .5XCNCT 15X34-48 (MISCELLANEOUS) ×1
CANNULA PRFSN .5XCNCT 15X34-48 (MISCELLANEOUS) ×1 IMPLANT
CANNULA VEN 2 STAGE (MISCELLANEOUS) ×3 IMPLANT
CANNULA VESSEL W/WING WO/VALVE (CANNULA) IMPLANT
CATH CPB KIT GERHARDT (MISCELLANEOUS) ×2 IMPLANT
CATH ROBINSON RED A/P 18FR (CATHETERS) IMPLANT
CATH THORACIC 28FR (CATHETERS) ×2 IMPLANT
CATH THORACIC 28FR RT ANG (CATHETERS) IMPLANT
CATH THORACIC 36FR (CATHETERS) IMPLANT
CATH THORACIC 36FR RT ANG (CATHETERS) IMPLANT
CLIP FOGARTY SPRING 6M (CLIP) IMPLANT
CLIP TI MEDIUM 24 (CLIP) IMPLANT
CLIP TI WIDE RED SMALL 24 (CLIP) IMPLANT
CLOTH BEACON ORANGE TIMEOUT ST (SAFETY) ×2 IMPLANT
COVER SURGICAL LIGHT HANDLE (MISCELLANEOUS) ×4 IMPLANT
CRADLE DONUT ADULT HEAD (MISCELLANEOUS) ×2 IMPLANT
DRAIN CHANNEL 19F RND (DRAIN) ×1 IMPLANT
DRAIN CHANNEL 28F RND 3/8 FF (WOUND CARE) ×2 IMPLANT
DRAIN CHANNEL 32F RND 10.7 FF (WOUND CARE) IMPLANT
DRAPE CARDIOVASCULAR INCISE (DRAPES) ×2
DRAPE SLUSH MACHINE 52X66 (DRAPES) IMPLANT
DRAPE SLUSH/WARMER DISC (DRAPES) IMPLANT
DRAPE SRG 135X102X78XABS (DRAPES) ×1 IMPLANT
DRSG COVADERM 4X14 (GAUZE/BANDAGES/DRESSINGS) ×2 IMPLANT
DRSG COVADERM 4X8 (GAUZE/BANDAGES/DRESSINGS) ×1 IMPLANT
ELECT BLADE 4.0 EZ CLEAN MEGAD (MISCELLANEOUS) ×2
ELECT CAUTERY BLADE 6.4 (BLADE) ×2 IMPLANT
ELECT REM PT RETURN 9FT ADLT (ELECTROSURGICAL) ×4
ELECTRODE BLDE 4.0 EZ CLN MEGD (MISCELLANEOUS) ×1 IMPLANT
ELECTRODE REM PT RTRN 9FT ADLT (ELECTROSURGICAL) ×2 IMPLANT
EVACUATOR SILICONE 100CC (DRAIN) ×1 IMPLANT
GAUZE KERLIX 2  STERILE LF (GAUZE/BANDAGES/DRESSINGS) ×2 IMPLANT
GLOVE BIO SURGEON STRL SZ 6 (GLOVE) IMPLANT
GLOVE BIO SURGEON STRL SZ 6.5 (GLOVE) ×8 IMPLANT
GLOVE BIO SURGEON STRL SZ7 (GLOVE) IMPLANT
GLOVE BIO SURGEON STRL SZ7.5 (GLOVE) IMPLANT
GLOVE BIOGEL PI IND STRL 6 (GLOVE) IMPLANT
GLOVE BIOGEL PI IND STRL 6.5 (GLOVE) IMPLANT
GLOVE BIOGEL PI IND STRL 7.0 (GLOVE) IMPLANT
GLOVE BIOGEL PI INDICATOR 6 (GLOVE)
GLOVE BIOGEL PI INDICATOR 6.5 (GLOVE) ×2
GLOVE BIOGEL PI INDICATOR 7.0 (GLOVE) ×2
GLOVE EUDERMIC 7 POWDERFREE (GLOVE) IMPLANT
GLOVE ORTHO TXT STRL SZ7.5 (GLOVE) IMPLANT
GOWN STRL NON-REIN LRG LVL3 (GOWN DISPOSABLE) ×8 IMPLANT
HEMOSTAT POWDER SURGIFOAM 1G (HEMOSTASIS) ×6 IMPLANT
HEMOSTAT SURGICEL 2X14 (HEMOSTASIS) ×2 IMPLANT
INSERT FOGARTY 61MM (MISCELLANEOUS) IMPLANT
INSERT FOGARTY XLG (MISCELLANEOUS) IMPLANT
KIT BASIN OR (CUSTOM PROCEDURE TRAY) ×2 IMPLANT
KIT ROOM TURNOVER OR (KITS) ×2 IMPLANT
KIT SUCTION CATH 14FR (SUCTIONS) ×4 IMPLANT
KIT VASOVIEW W/TROCAR VH 2000 (KITS) ×2 IMPLANT
LEAD PACING MYOCARDI (MISCELLANEOUS) ×2 IMPLANT
MARKER GRAFT CORONARY BYPASS (MISCELLANEOUS) ×6 IMPLANT
NS IRRIG 1000ML POUR BTL (IV SOLUTION) ×10 IMPLANT
PACK OPEN HEART (CUSTOM PROCEDURE TRAY) ×2 IMPLANT
PAD ARMBOARD 7.5X6 YLW CONV (MISCELLANEOUS) ×4 IMPLANT
PENCIL BUTTON HOLSTER BLD 10FT (ELECTRODE) ×2 IMPLANT
PUNCH AORTIC ROTATE 4.0MM (MISCELLANEOUS) IMPLANT
PUNCH AORTIC ROTATE 4.5MM 8IN (MISCELLANEOUS) ×1 IMPLANT
PUNCH AORTIC ROTATE 5MM 8IN (MISCELLANEOUS) IMPLANT
SET CARDIOPLEGIA MPS 5001102 (MISCELLANEOUS) ×1 IMPLANT
SOLUTION ANTI FOG 6CC (MISCELLANEOUS) IMPLANT
SPONGE GAUZE 4X4 12PLY (GAUZE/BANDAGES/DRESSINGS) ×4 IMPLANT
SPONGE GAUZE 4X4 STERILE 39 (GAUZE/BANDAGES/DRESSINGS) ×3 IMPLANT
SPONGE INTESTINAL PEANUT (DISPOSABLE) IMPLANT
SPONGE LAP 18X18 X RAY DECT (DISPOSABLE) ×5 IMPLANT
SPONGE LAP 4X18 X RAY DECT (DISPOSABLE) IMPLANT
SUT BONE WAX W31G (SUTURE) IMPLANT
SUT MNCRL AB 4-0 PS2 18 (SUTURE) ×1 IMPLANT
SUT PROLENE 2 0 SH DA (SUTURE) ×1 IMPLANT
SUT PROLENE 3 0 SH 1 (SUTURE) ×2 IMPLANT
SUT PROLENE 3 0 SH DA (SUTURE) IMPLANT
SUT PROLENE 3 0 SH1 36 (SUTURE) IMPLANT
SUT PROLENE 4 0 RB 1 (SUTURE)
SUT PROLENE 4 0 SH DA (SUTURE) IMPLANT
SUT PROLENE 4 0 TF (SUTURE) IMPLANT
SUT PROLENE 4-0 RB1 .5 CRCL 36 (SUTURE) IMPLANT
SUT PROLENE 5 0 C 1 36 (SUTURE) IMPLANT
SUT PROLENE 6 0 C 1 30 (SUTURE) IMPLANT
SUT PROLENE 6 0 CC (SUTURE) ×4 IMPLANT
SUT PROLENE 7 0 BV 1 (SUTURE) IMPLANT
SUT PROLENE 7 0 BV1 MDA (SUTURE) ×3 IMPLANT
SUT PROLENE 7.0 RB 3 (SUTURE) IMPLANT
SUT PROLENE 8 0 BV175 6 (SUTURE) ×3 IMPLANT
SUT SILK  1 MH (SUTURE)
SUT SILK 1 MH (SUTURE) IMPLANT
SUT SILK 2 0 SH CR/8 (SUTURE) IMPLANT
SUT SILK 3 0 SH CR/8 (SUTURE) IMPLANT
SUT STEEL 6MS V (SUTURE) ×1 IMPLANT
SUT STEEL STERNAL CCS#1 18IN (SUTURE) ×2 IMPLANT
SUT STEEL SZ 6 DBL 3X14 BALL (SUTURE) ×2 IMPLANT
SUT VIC AB 1 CTX 18 (SUTURE) ×4 IMPLANT
SUT VIC AB 1 CTX 36 (SUTURE)
SUT VIC AB 1 CTX36XBRD ANBCTR (SUTURE) IMPLANT
SUT VIC AB 2-0 CT1 27 (SUTURE) ×6
SUT VIC AB 2-0 CT1 TAPERPNT 27 (SUTURE) IMPLANT
SUT VIC AB 2-0 CTX 27 (SUTURE) IMPLANT
SUT VIC AB 3-0 SH 27 (SUTURE)
SUT VIC AB 3-0 SH 27X BRD (SUTURE) IMPLANT
SUT VIC AB 3-0 X1 27 (SUTURE) IMPLANT
SUT VICRYL 4-0 PS2 18IN ABS (SUTURE) IMPLANT
SUTURE E-PAK OPEN HEART (SUTURE) ×2 IMPLANT
SYSTEM SAHARA CHEST DRAIN ATS (WOUND CARE) ×2 IMPLANT
TOWEL OR 17X24 6PK STRL BLUE (TOWEL DISPOSABLE) ×4 IMPLANT
TOWEL OR 17X26 10 PK STRL BLUE (TOWEL DISPOSABLE) ×4 IMPLANT
TRAY FOLEY IC TEMP SENS 14FR (CATHETERS) ×2 IMPLANT
TUBE FEEDING 8FR 16IN STR KANG (MISCELLANEOUS) ×2 IMPLANT
TUBE SUCT INTRACARD DLP 20F (MISCELLANEOUS) ×2 IMPLANT
TUBING INSUFFLATION 10FT LAP (TUBING) ×3 IMPLANT
UNDERPAD 30X30 INCONTINENT (UNDERPADS AND DIAPERS) ×2 IMPLANT
WATER STERILE IRR 1000ML POUR (IV SOLUTION) ×4 IMPLANT

## 2011-08-05 NOTE — Anesthesia Postprocedure Evaluation (Signed)
  Anesthesia Post-op Note  Patient: [name]  Procedure(s) Performed:  CORONARY ARTERY BYPASS GRAFTING (CABG) - coronary artery bypass graft times 4 using left internal mammary artery and right leg saphenous vein harvested endoscopically  Patient Location: PACU  Anesthesia Type: General  Level of Consciousness: sedated and Patient remains intubated per anesthesia plan  Airway and Oxygen Therapy: Patient remains intubated per anesthesia plan and Patient placed on Ventilator (see vital sign flow sheet for setting)  Post-op Pain: none  Post-op Assessment: Post-op Vital signs reviewed, Patient's Cardiovascular Status Stable, Respiratory Function Stable, Patent Airway, No signs of Nausea or vomiting and Pain level controlled  Post-op Vital Signs: Reviewed and stable  Complications: No apparent anesthesia complications

## 2011-08-05 NOTE — Anesthesia Preprocedure Evaluation (Addendum)
Anesthesia Evaluation  Patient identified by MRN, date of birth, ID band Patient awake    Reviewed: Allergy & Precautions, H&P , NPO status , Patient's Chart, lab work & pertinent test results  History of Anesthesia Complications (+) PONV  Airway Mallampati: I TM Distance: >3 FB Neck ROM: Full    Dental  (+) Teeth Intact and Dental Advisory Given   Pulmonary shortness of breath and with exertion, pneumonia ,  clear to auscultation        Cardiovascular hypertension, Pt. on home beta blockers and Pt. on medications + angina with exertion + CAD and + Past MI Regular Normal    Neuro/Psych  Headaches, Anxiety    GI/Hepatic   Endo/Other  Diabetes mellitus-, Type 2, Oral Hypoglycemic AgentsHypothyroidism   Renal/GU      Musculoskeletal   Abdominal   Peds  Hematology   Anesthesia Other Findings   Reproductive/Obstetrics                        Anesthesia Physical Anesthesia Plan  ASA: IV  Anesthesia Plan: General   Post-op Pain Management:    Induction: Intravenous  Airway Management Planned: Oral ETT  Additional Equipment: Arterial line, PA Cath and Ultrasound Guidance Line Placement  Intra-op Plan:   Post-operative Plan:   Informed Consent: I have reviewed the patients History and Physical, chart, labs and discussed the procedure including the risks, benefits and alternatives for the proposed anesthesia with the patient or authorized representative who has indicated his/her understanding and acceptance.   Dental advisory given  Plan Discussed with: CRNA, Anesthesiologist and Surgeon  Anesthesia Plan Comments:         Anesthesia Quick Evaluation

## 2011-08-05 NOTE — Progress Notes (Signed)
Patient ID: [name], female   DOB: 07-08-1921, 78 y.o.   MRN: [id number]  Filed Vitals:   08/05/11 1830  BP: 101/49  Pulse: 90  Temp: 96.3 F (35.7 C)  Resp: 16   Still asleep on vent.  Urine output ok  CT output low.  CBC    Component Value Date/Time   WBC 15.7* 08/05/2011 1410   RBC 3.19* 08/05/2011 1410   HGB 8.8* 08/05/2011 1414   HCT 26.0* 08/05/2011 1414   PLT PLATELET CLUMPS NOTED ON SMEAR, COUNT APPEARS DECREASED 08/05/2011 1410   MCV 80.6 08/05/2011 1410   MCH 26.6 08/05/2011 1410   MCHC 33.1 08/05/2011 1410   RDW 15.2 08/05/2011 1410   LYMPHSABS 1.5 07/28/2011 1350   MONOABS 0.5 07/28/2011 1350   EOSABS 0.4 07/28/2011 1350   BASOSABS 0.1 07/28/2011 1350    BMET    Component Value Date/Time   NA 138 08/05/2011 1414   K 3.7 08/05/2011 1414   CL 105 08/05/2011 0510   CO2 26 08/05/2011 0510   GLUCOSE 116* 08/05/2011 1414   BUN 29* 08/05/2011 0510   CREATININE 1.57* 08/05/2011 0510   CALCIUM 10.2 08/05/2011 0510   GFRNONAA 28* 08/05/2011 0510   GFRAA 33* 08/05/2011 0510    A/P:  Borderline BP and anemia.  May need transfusion.  6 hr labs pending. Wean vent as tolerated.

## 2011-08-05 NOTE — Progress Notes (Signed)
Patient ID: [name], female   DOB: 1921/06/06, 78 y.o.   MRN: [id number] Subjective:  Pt. Seen post-op CABG; remains intubated - awaiting return of antive respiratory drive for extubation. DDD pacing  BP's borderline on low dose Dopamine & Neo.    Objective:  Vital Signs in the last 24 hours: Temp:  [94.8 F (34.9 C)-98 F (36.7 C)] 95.9 F (35.5 C) (01/25 1700) Pulse Rate:  [88-90] 90  (01/25 1700) Resp:  [0-18] 9  (01/25 1700) BP: (104-142)/(53-85) 120/56 mmHg (01/25 1700) SpO2:  [96 %-100 %] 100 % (01/25 1700) Arterial Line BP: (67-116)/(42-60) 101/53 mmHg (01/25 1700) FiO2 (%):  [50 %] 50 % (01/25 1600) Weight:  [91.6 kg (201 lb 15.1 oz)] 91.6 kg (201 lb 15.1 oz) (01/25 1415)  Intake/Output from previous day: 01/24 0701 - 01/25 0700 In: 990.5 [P.O.:560; I.V.:430.5] Out: 750 [Urine:750] Intake/Output from this shift: Total I/O In: 4565 [I.V.:3475; Blood:550; IV Piggyback:540] Out: 1265 [Urine:1160; Drains:35; Chest Tube:70]  Physical Exam: General appearance: Intubated, sedated.  Tears from eyes. Neck: no adenopathy, no carotid bruit, no JVD, supple, symmetrical, trachea midline and thyroid not enlarged, symmetric, no tenderness/mass/nodules Lungs: clear to auscultation bilaterally and in anterior & lateral lung fields with mild basal rales. Heart: S1, S2 normal and no S3 or S4; + rub post-op; 1/6 SM Abdomen: soft, non-tender; bowel sounds normal; no masses,  no organomegaly Neurologic: Mental status: intubated, sedated  LABS Reviewed; WBC @ ~15  . acetaminophen (TYLENOL) oral liquid 160 mg/5 mL  650 mg Per Tube NOW   Or  . acetaminophen  650 mg Rectal NOW  . acetaminophen  1,000 mg Oral Q6H   Or  . acetaminophen (TYLENOL) oral liquid 160 mg/5 mL  975 mg Per Tube Q6H  . allopurinol  100 mg Oral Daily  . aspirin EC  325 mg Oral Daily   Or  . aspirin  324 mg Per Tube Daily  . bisacodyl  10 mg Oral Daily   Or  . bisacodyl  10 mg Rectal Daily  . cefUROXime  (ZINACEF)  IV  1.5 g Intravenous To OR  . cefUROXime (ZINACEF)  IV  1.5 g Intravenous Q12H  . chlorhexidine  60 mL Topical Once  . chlorhexidine  60 mL Topical Once  . colesevelam  1,875 mg Oral BID WC  . dextrose  13 mL Intravenous Once  . docusate sodium  200 mg Oral Daily  . ezetimibe  10 mg Oral Daily  . famotidine (PEPCID) IV  20 mg Intravenous Q12H  . insulin regular  0-10 Units Intravenous TID WC  . levothyroxine  100 mcg Oral QAC breakfast  . magnesium sulfate  4 g Intravenous Once  . metoprolol tartrate  12.5 mg Oral BID   Or  . metoprolol tartrate  12.5 mg Per Tube BID  . metoprolol tartrate  12.5 mg Oral Once  . nitroglycerin/verapamil/heparin/sodium bicarbonate solution irrigation for artery spasm   Irrigation To OR  . pantoprazole  40 mg Oral Q1200  . potassium chloride  10 mEq Intravenous Q1 Hr x 3  . sodium chloride  3 mL Intravenous Q12H  . vancomycin (VANCOCIN) IVPB 1000 mg/100 mL central line  1,000 mg Intravenous Once  . vancomycin  1,500 mg Intravenous To OR     Lab Results:   Basename 08/05/11 1414 08/05/11 1410 08/05/11 1145 08/05/11 0510  WBC -- 15.7* -- 6.7  HGB 8.8* 8.5* -- --  PLT -- PLATELET CLUMPS NOTED ON SMEAR, COUNT APPEARS  DECREASED 115* --    Basename 08/05/11 1414 08/05/11 1257 08/05/11 0510 08/04/11 2006  NA 138 138 -- --  K 3.7 3.7 -- --  CL -- -- 105 104  CO2 -- -- 26 23  GLUCOSE 116* 178* -- --  BUN -- -- 29* 30*  CREATININE -- -- 1.57* 1.62*    Cardiac Studies: CABG x 3 (LIMA mid-distal LAD, SVG-OM, SVG- rPDA)  Assessment/Plan:  1) Angina,(asymptomatic) with Multi -Vessel CAD, CABG today  2) HLD 3) DM2 4) h/o near syncope 5) Htn 6) Hypothyroidism 7)CKD  S/p difficult CABG x 3 (difficult to find adequate conduits) -- LIMA sequential to mid & distal LAD (due to mid LAD lesion); SVG-OM, SVG-rPDA  On minimal pressor support, good UOP Still requiring Pacing Anticipate extubation later tonite.   We will follow. --  LOS: 6  days    HARDING,DAVID W 08/05/2011, 5:23 PM

## 2011-08-05 NOTE — Preoperative (Signed)
Beta Blockers  Took Metoprolol 50 @ 5 am today

## 2011-08-05 NOTE — Progress Notes (Signed)
Picked up patient at 2300, weaning started earlier with patient vent settings at Fi02 40% with a rate of 4. BP fairly labile with systolic 60-100. Second unit of PRBC infusing and patient being AV paced at 90. Patient temp 96.5. BP and temperature not meeting parameters for weaning. Patient placed back on full vent support at this time. Pacer changed to AV backup at 60, BP increased to 110-120's. Will continue to warm patient until parameters met and will be begin weaning at that time.

## 2011-08-05 NOTE — Transfer of Care (Signed)
Immediate Anesthesia Transfer of Care Note  Patient: [name]  Procedure(s) Performed:  CORONARY ARTERY BYPASS GRAFTING (CABG) - coronary artery bypass graft times 4 using left internal mammary artery and right leg saphenous vein harvested endoscopically  Patient Location: SICU  Anesthesia Type: General  Level of Consciousness: unresponsive  Airway & Oxygen Therapy: Patient placed on Ventilator (see vital sign flow sheet for setting)  Post-op Assessment: Post -op Vital signs reviewed and stable  Post vital signs: Reviewed and stable  Complications: No apparent anesthesia complications

## 2011-08-05 NOTE — Brief Op Note (Signed)
07/30/2011 - 08/05/2011  1:21 PM  PATIENT:  [name]  78 y.o. female  PRE-OPERATIVE DIAGNOSIS: 1. S/P STEMI 2. History of CAD (s/p balloon angioplasty RCA November 2012)  POST-OPERATIVE DIAGNOSIS:   1. S/P STEMI 2. History of CAD (s/p balloon angioplasty RCA November 2012)  PROCEDURE:  CORONARY ARTERY BYPASS GRAFTING (CABG) x 4 (LIMA sequentially to mid and distal LAD,SVG to distal Circumflex, and SVG to RCA) with EVH right thigh only  SURGEON:  [name], MD  PHYSICIAN ASSISTANT: Doree Fudge PA-C   ANESTHESIA:   general  EBL:  Total I/O In: 4100 [I.V.:3050; Blood:550; IV Piggyback:500] Out: 825 [Urine:825]  DRAINS: Chest tubes placed in pleural spaces, mediastinum, and Blake drain in the right lower extremity  COUNTS CORRECT:  YES  DICTATION: .Dragon Dictation  PLAN OF CARE: Admit to inpatient   PATIENT DISPOSITION:  ICU - intubated and hemodynamically stable.   Delay start of Pharmacological VTE agent (>24hrs) due to surgical blood loss or risk of bleeding: YES   PRE OP weight: 91.6 kg

## 2011-08-05 NOTE — Anesthesia Procedure Notes (Signed)
Procedure Name: Intubation Date/Time: 08/05/2011 7:42 AM Performed by: Tyrone Nine Pre-anesthesia Checklist: Patient identified, Emergency Drugs available, Suction available and Patient being monitored Patient Re-evaluated:Patient Re-evaluated prior to inductionOxygen Delivery Method: Circle System Utilized Preoxygenation: Pre-oxygenation with 100% oxygen Intubation Type: IV induction Ventilation: Mask ventilation without difficulty Laryngoscope Size: Mac and 3 Grade View: Grade II Tube type: Oral Tube size: 8.0 mm Number of attempts: 1 Airway Equipment and Method: stylet

## 2011-08-06 ENCOUNTER — Encounter (HOSPITAL_COMMUNITY): Payer: Self-pay | Admitting: Cardiology

## 2011-08-06 ENCOUNTER — Inpatient Hospital Stay (HOSPITAL_COMMUNITY): Payer: Medicare Other

## 2011-08-06 LAB — CBC
HCT: 29.4 % — ABNORMAL LOW (ref 36.0–46.0)
MCHC: 33.8 g/dL (ref 30.0–36.0)
MCHC: 33.3 g/dL (ref 30.0–36.0)
MCV: 81.4 fL (ref 78.0–100.0)
Platelets: 99 10*3/uL — ABNORMAL LOW (ref 150–400)
RDW: 15.3 % (ref 11.5–15.5)
RDW: 15.7 % — ABNORMAL HIGH (ref 11.5–15.5)
WBC: 18.2 10*3/uL — ABNORMAL HIGH (ref 4.0–10.5)

## 2011-08-06 LAB — BASIC METABOLIC PANEL
BUN: 21 mg/dL (ref 6–23)
Chloride: 107 mEq/L (ref 96–112)
GFR calc Af Amer: 41 mL/min — ABNORMAL LOW (ref >90)
GFR calc non Af Amer: 36 mL/min — ABNORMAL LOW (ref >90)
Potassium: 4.2 mEq/L (ref 3.5–5.1)
Sodium: 139 mEq/L (ref 135–145)

## 2011-08-06 LAB — GLUCOSE, CAPILLARY
Glucose-Capillary: 114 mg/dL — ABNORMAL HIGH (ref 70–99)
Glucose-Capillary: 123 mg/dL — ABNORMAL HIGH (ref 70–99)
Glucose-Capillary: 128 mg/dL — ABNORMAL HIGH (ref 70–99)
Glucose-Capillary: 136 mg/dL — ABNORMAL HIGH (ref 70–99)
Glucose-Capillary: 148 mg/dL — ABNORMAL HIGH (ref 70–99)
Glucose-Capillary: 157 mg/dL — ABNORMAL HIGH (ref 70–99)
Glucose-Capillary: 170 mg/dL — ABNORMAL HIGH (ref 70–99)
Glucose-Capillary: 68 mg/dL — ABNORMAL LOW (ref 70–99)
Glucose-Capillary: 99 mg/dL (ref 70–99)

## 2011-08-06 LAB — TYPE AND SCREEN: Unit division: 0

## 2011-08-06 LAB — POCT I-STAT 3, ART BLOOD GAS (G3+)
Patient temperature: 36.3
Patient temperature: 35.9
pCO2 arterial: 41.8 mmHg (ref 35.0–45.0)
pCO2 arterial: 53.2 mmHg — ABNORMAL HIGH (ref 35.0–45.0)
pH, Arterial: 7.35 (ref 7.350–7.400)
pH, Arterial: 7.255 — ABNORMAL LOW (ref 7.350–7.400)

## 2011-08-06 LAB — POCT I-STAT, CHEM 8
BUN: 22 mg/dL (ref 6–23)
Sodium: 141 mEq/L (ref 135–145)
TCO2: 22 mmol/L (ref 0–100)

## 2011-08-06 LAB — MAGNESIUM
Magnesium: 2 mg/dL (ref 1.5–2.5)
Magnesium: 1.9 mg/dL (ref 1.5–2.5)

## 2011-08-06 MED ORDER — INSULIN GLARGINE 100 UNIT/ML ~~LOC~~ SOLN
30.0000 [IU] | SUBCUTANEOUS | Status: DC
  Administered 2011-08-06 – 2011-08-10 (×5): 30 [IU] via SUBCUTANEOUS
  Filled 2011-08-06: qty 3

## 2011-08-06 MED ORDER — INSULIN ASPART 100 UNIT/ML ~~LOC~~ SOLN
0.0000 [IU] | SUBCUTANEOUS | Status: DC
  Administered 2011-08-06: 2 [IU] via SUBCUTANEOUS
  Administered 2011-08-06: 4 [IU] via SUBCUTANEOUS
  Administered 2011-08-06: 2 [IU] via SUBCUTANEOUS
  Administered 2011-08-06: 4 [IU] via SUBCUTANEOUS
  Administered 2011-08-07 (×2): 2 [IU] via SUBCUTANEOUS
  Administered 2011-08-07: 4 [IU] via SUBCUTANEOUS
  Administered 2011-08-07 – 2011-08-08 (×4): 2 [IU] via SUBCUTANEOUS
  Administered 2011-08-08: 4 [IU] via SUBCUTANEOUS
  Administered 2011-08-08 – 2011-08-10 (×7): 2 [IU] via SUBCUTANEOUS
  Filled 2011-08-06: qty 3

## 2011-08-06 NOTE — Procedures (Signed)
Extubation Procedure Note  Patient Details:   Name: [name] DOB: 08/04/1920 MRN: [id number]   Airway Documentation:  Patient extubated to 4 lpm nasal cannula.  VC 520 ml, NIF -35, patient able to hold head off bed 10 seconds.  No complications, able to breathe around deflated cuff, able to vocalize post procedure.  Evaluation  O2 sats: stable throughout Complications: No apparent complications Patient did tolerate procedure well. Bilateral Breath Sounds: Clear;Diminished   Yes  Umar Patmon, Aloha Gell 08/06/2011, 2:50 AM

## 2011-08-06 NOTE — Progress Notes (Signed)
1 Day Post-Op Procedure(s) (LRB): CORONARY ARTERY BYPASS GRAFTING (CABG) (N/A) Subjective: No complaints  Objective: Vital signs in last 24 hours: Temp:  [94.8 F (34.9 C)-97.3 F (36.3 C)] 97 F (36.1 C) (01/26 0915) Pulse Rate:  [60-132] 87  (01/26 0915) Cardiac Rhythm:  [-] Normal sinus rhythm (01/26 0700) Resp:  [0-27] 20  (01/26 0915) BP: (76-168)/(34-92) 118/45 mmHg (01/26 0900) SpO2:  [76 %-100 %] 96 % (01/26 0915) Arterial Line BP: (67-125)/(41-66) 93/44 mmHg (01/26 0915) FiO2 (%):  [40 %-50 %] 40 % (01/26 0211) Weight:  [91.6 kg (201 lb 15.1 oz)-97.9 kg (215 lb 13.3 oz)] 97.9 kg (215 lb 13.3 oz) (01/26 0430)  Hemodynamic parameters for last 24 hours: PAP: (24-57)/(12-33) 40/13 mmHg CO:  [3.6 L/min-6.1 L/min] 6.1 L/min CI:  [1.9 L/min/m2-3.2 L/min/m2] 3.2 L/min/m2  Intake/Output from previous day: 01/25 0701 - 01/26 0700 In: 6701.9 [I.V.:4551.9; Blood:1560; NG/GT:50; IV Piggyback:540] Out: 4135 [Urine:3330; Emesis/NG output:100; Drains:145; Chest Tube:410] Intake/Output this shift: Total I/O In: 40 [I.V.:40] Out: 140 [Urine:120; Chest Tube:20]  General appearance: cooperative and appropriate. sleepy Neurologic: intact Heart: regular rate and rhythm Lungs: clear to auscultation bilaterally Extremities: edema moderate edema Wound: dressing dry  Lab Results:  Basename 08/06/11 0400 08/05/11 1854 08/05/11 1847  WBC 18.2* -- 16.0*  HGB 10.6* 9.9* --  HCT 31.4* 29.0* --  PLT 99* -- 112*   BMET:  Basename 08/06/11 0400 08/05/11 1854 08/05/11 0510  NA 139 139 --  K 4.2 4.3 --  CL 107 111 --  CO2 22 -- 26  GLUCOSE 144* 106* --  BUN 21 21 --  CREATININE 1.30* 1.40* --  CALCIUM 9.6 -- 10.2    PT/INR:  Basename 08/05/11 1410  LABPROT 20.5*  INR 1.72*   ABG    Component Value Date/Time   PHART 7.255* 08/06/2011 0410   HCO3 23.9 08/06/2011 0410   TCO2 26 08/06/2011 0410   ACIDBASEDEF 4.0* 08/06/2011 0410   O2SAT 91.0 08/06/2011 0410   CBG (last 3)    Basename 08/06/11 0808 08/06/11 0334 08/06/11 0019  GLUCAP 157* 128* 132*    Assessment/Plan: S/P Procedure(s) (LRB): CORONARY ARTERY BYPASS GRAFTING (CABG) (N/A) Wean neo then dopamine as BP allows. Start Lantus DC swan, chest tubes Hold on further diuresis until off vasopressors. Acute blood loss anemia improved after transfusion. Mobilize, IS   LOS: 7 days    Radie Berges K 08/06/2011

## 2011-08-06 NOTE — Progress Notes (Signed)
Patient ID: SCARLETTROSE COSTILOW, female   DOB: 03-29-1921, 78 y.o.   MRN: [id number] Subjective:  Awake- sore but breathing easier Blood transfusion o/n for anemia; Extubated last PM & Swan / CT removed this AM. Remains on Neo & Renal Dopamine  Objective:  Vital Signs in the last 24 hours: Temp:  [94.8 F (34.9 C)-97.3 F (36.3 C)] 97.3 F (36.3 C) (01/26 1130) Pulse Rate:  [60-132] 93  (01/26 1130) Resp:  [0-27] 17  (01/26 1130) BP: (76-168)/(34-92) 120/40 mmHg (01/26 1000) SpO2:  [76 %-100 %] 97 % (01/26 1130) Arterial Line BP: (67-125)/(41-66) 95/47 mmHg (01/26 1130) FiO2 (%):  [40 %-50 %] 40 % (01/26 0211) Weight:  [91.6 kg (201 lb 15.1 oz)-97.9 kg (215 lb 13.3 oz)] 97.9 kg (215 lb 13.3 oz) (01/26 0430)  Intake/Output from previous day: 01/25 0701 - 01/26 0700 In: 6701.9 [I.V.:4551.9; Blood:1560; NG/GT:50; IV Piggyback:540] Out: 4135 [Urine:3330; Emesis/NG output:100; Drains:145; Chest Tube:410] Intake/Output from this shift: Total I/O In: 100 [I.V.:100] Out: 335 [Urine:295; Chest Tube:40]  Physical Exam: General appearance: Happy, sore, NAD. Neck: no adenopathy, no carotid bruit, no JVD, supple, symmetrical, trachea midline and thyroid not enlarged, symmetric, no tenderness/mass/nodules Lungs: clear to auscultation bilaterally and in anterior & lateral lung fields with mild basal rales. Heart: S1, S2 normal and no S3 or S4; + rub post-op; 1/6 SM Abdomen: soft, non-tender; bowel sounds normal; no masses,  no organomegaly Neurologic: Mental status: A&O x3   . acetaminophen (TYLENOL) oral liquid 160 mg/5 mL  650 mg Per Tube NOW   Or  . acetaminophen  650 mg Rectal NOW  . acetaminophen  1,000 mg Oral Q6H   Or  . acetaminophen (TYLENOL) oral liquid 160 mg/5 mL  975 mg Per Tube Q6H  . allopurinol  100 mg Oral Daily  . aspirin EC  325 mg Oral Daily   Or  . aspirin  324 mg Per Tube Daily  . bisacodyl  10 mg Oral Daily   Or  . bisacodyl  10 mg Rectal Daily  . cefUROXime  (ZINACEF)  IV  1.5 g Intravenous To OR  . cefUROXime (ZINACEF)  IV  1.5 g Intravenous Q12H  . chlorhexidine  60 mL Topical Once  . chlorhexidine  60 mL Topical Once  . colesevelam  1,875 mg Oral BID WC  . dextrose  13 mL Intravenous Once  . docusate sodium  200 mg Oral Daily  . ezetimibe  10 mg Oral Daily  . famotidine (PEPCID) IV  20 mg Intravenous Q12H  . insulin regular  0-10 Units Intravenous TID WC  . levothyroxine  100 mcg Oral QAC breakfast  . magnesium sulfate  4 g Intravenous Once  . metoprolol tartrate  12.5 mg Oral BID   Or  . metoprolol tartrate  12.5 mg Per Tube BID  . metoprolol tartrate  12.5 mg Oral Once  . nitroglycerin/verapamil/heparin/sodium bicarbonate solution irrigation for artery spasm   Irrigation To OR  . pantoprazole  40 mg Oral Q1200  . potassium chloride  10 mEq Intravenous Q1 Hr x 3  . sodium chloride  3 mL Intravenous Q12H  . vancomycin (VANCOCIN) IVPB 1000 mg/100 mL central line  1,000 mg Intravenous Once  . vancomycin  1,500 mg Intravenous To OR     Lab Results:   Basename 08/06/11 0400 08/05/11 1854 08/05/11 1847  WBC 18.2* -- 16.0*  HGB 10.6* 9.9* --  PLT 99* -- 112*    Basename 08/06/11 0400 08/05/11 1854 08/05/11  0510  NA 139 139 --  K 4.2 4.3 --  CL 107 111 --  CO2 22 -- 26  GLUCOSE 144* 106* --  BUN 21 21 --  CREATININE 1.30* 1.40* --   LABS REVIEWED: Renal Fx stable, appropriate H/H increase s/p transfusion. Cardiac Studies: CABG x 3 (LIMA mid-distal LAD, SVG-OM, SVG- rPDA)  Assessment/Plan:  1) Angina,(asymptomatic) with Multi -Vessel CAD, CABG 08/05/2011 2) HLD 3) DM2 4) h/o near syncope 5) Htn 6) Hypothyroidism 7)CKD  POD #2 S/p difficult CABG x 3 (difficult to find adequate conduits) -- LIMA sequential to mid & distal LAD (due to mid LAD lesion); SVG-OM, SVG-rPDA  Recovering remarkably well.  On minimal pressor support, good UOP - Swan & CT d/c'd No longer Pacing Agree with holding off diuresis until BP stable off  pressors OOB to chair today  We will follow. --  LOS: 7 days    HARDING,DAVID W 08/06/2011, 1:12 PM

## 2011-08-06 NOTE — Progress Notes (Signed)
One hour post extubation ABG results showed respiratory acidosis. IS and deep breathing done with patient at this time. Also dangled and stood patient at side of bed followed by some coughing and deep breathing. Patient alert and oriented. Will continue to monitor mental status and work on deep breathing with patient.

## 2011-08-07 ENCOUNTER — Inpatient Hospital Stay (HOSPITAL_COMMUNITY): Payer: Medicare Other

## 2011-08-07 DIAGNOSIS — I2510 Atherosclerotic heart disease of native coronary artery without angina pectoris: Secondary | ICD-10-CM

## 2011-08-07 LAB — CBC
MCH: 27.4 pg (ref 26.0–34.0)
Platelets: 95 10*3/uL — ABNORMAL LOW (ref 150–400)
RBC: 3.54 MIL/uL — ABNORMAL LOW (ref 3.87–5.11)
WBC: 20.8 10*3/uL — ABNORMAL HIGH (ref 4.0–10.5)

## 2011-08-07 LAB — BASIC METABOLIC PANEL
Calcium: 9.3 mg/dL (ref 8.4–10.5)
GFR calc non Af Amer: 30 mL/min — ABNORMAL LOW (ref >90)
Sodium: 140 mEq/L (ref 135–145)

## 2011-08-07 LAB — GLUCOSE, CAPILLARY
Glucose-Capillary: 165 mg/dL — ABNORMAL HIGH (ref 70–99)
Glucose-Capillary: 167 mg/dL — ABNORMAL HIGH (ref 70–99)

## 2011-08-07 MED ORDER — AMIODARONE HCL IN DEXTROSE 360-4.14 MG/200ML-% IV SOLN: 60.0000 mg/h | INTRAVENOUS | Status: DC

## 2011-08-07 MED ORDER — AMIODARONE IV BOLUS ONLY 150 MG/100ML
150.0000 mg | Freq: Once | INTRAVENOUS | Status: AC
  Administered 2011-08-07: 150 mg via INTRAVENOUS

## 2011-08-07 MED ORDER — AMIODARONE HCL IN DEXTROSE 360-4.14 MG/200ML-% IV SOLN
60.0000 mg/h | INTRAVENOUS | Status: DC
  Administered 2011-08-07 (×2): 60 mg/h via INTRAVENOUS
  Administered 2011-08-08: 30 mg/h via INTRAVENOUS
  Administered 2011-08-08: 60 mg/h via INTRAVENOUS
  Administered 2011-08-09 – 2011-08-10 (×3): 30 mg/h via INTRAVENOUS
  Filled 2011-08-07 (×18): qty 200

## 2011-08-07 NOTE — Progress Notes (Signed)
2 Days Post-Op Procedure(s) (LRB): CORONARY ARTERY BYPASS GRAFTING (CABG) (N/A) Subjective: No complaints  Objective: Vital signs in last 24 hours: Temp:  [97 F (36.1 C)-97.9 F (36.6 C)] 97.5 F (36.4 C) (01/27 0706) Pulse Rate:  [67-115] 93  (01/27 0900) Cardiac Rhythm:  [-] Normal sinus rhythm (01/26 2000) Resp:  [14-31] 19  (01/27 0900) BP: (70-133)/(26-73) 86/73 mmHg (01/27 0900) SpO2:  [82 %-99 %] 96 % (01/27 0900) Arterial Line BP: (82-127)/(30-88) 126/30 mmHg (01/27 0630) Weight:  [97.7 kg (215 lb 6.2 oz)] 97.7 kg (215 lb 6.2 oz) (01/27 0630)   Still on dopamine 38mcg/kg/min and neo restarted this am for borderline bp.  Hemodynamic parameters for last 24 hours: PAP: (39-41)/(14-19) 40/17 mmHg  Intake/Output from previous day: 01/26 0701 - 01/27 0700 In: 360 [P.O.:240; I.V.:120] Out: 1080 [Urine:740; Drains:155; Chest Tube:185] Intake/Output this shift:    General appearance: alert and appears stated age Heart: regular rate and rhythm, S1, S2 normal, no murmur, click, rub or gallop Lungs: clear to auscultation bilaterally Extremities: edema moderate peripheral Wound: dressing dry  Lab Results:  Basename 08/07/11 0330 08/06/11 1825  WBC 20.8* 18.2*  HGB 9.7* 9.8*  HCT 28.7* 29.4*  PLT 95* 89*   BMET:  Basename 08/07/11 0330 08/06/11 1825 08/06/11 1814 08/06/11 0400  NA 140 -- 141 --  [name] 4.3 -- 4.2 --  CL 106 -- 110 --  CO2 22 -- -- 22  GLUCOSE 121* -- 148* --  BUN 25* -- 22 --  CREATININE 1.50* 1.41* -- --  CALCIUM 9.3 -- -- 9.6    PT/INR:  Basename 08/05/11 1410  LABPROT 20.5*  INR 1.72*   ABG    Component Value Date/Time   PHART 7.255* 08/06/2011 0410   HCO3 23.9 08/06/2011 0410   TCO2 22 08/06/2011 1814   ACIDBASEDEF 4.0* 08/06/2011 0410   O2SAT 91.0 08/06/2011 0410   CBG (last 3)   Basename 08/07/11 0705 08/07/11 0341 08/06/11 2330  GLUCAP 120* 113* 136*   CXR:  Low lung volumes, some edema  Assessment/Plan: S/P Procedure(s)  (LRB): CORONARY ARTERY BYPASS GRAFTING (CABG) (N/A) Remains vasodilated on dop and neo.   Sleeve is leaking through valve so will have PICC line inserted for access. IS, OOB, will need PT.   LOS: 8 days    [name] 08/07/2011

## 2011-08-07 NOTE — Progress Notes (Signed)
08/07/11 13:29 PT Note: Received PT order.  Attempted evaluation, but pt extremely fatigued this pm.  Per RN, pt already OOB to chair this am getting back in bed about 30 minutes ago.  RN reports she will attempt some gait later if pt is able.  Will follow-up 08/08/11 for evaluation.  Thanks.  08/07/2011 Cephus Shelling, PT, DPT [phone]

## 2011-08-07 NOTE — Procedures (Signed)
Central Venous Catheter Insertion Procedure Note SHANDRICKA MONROY [id number] 1921-03-01  Procedure: Insertion of Central Venous Catheter Indications: Drug and/or fluid administration  Procedure Details Consent: Risks of procedure as well as the alternatives and risks of each were explained to the (patient/caregiver).  Consent for procedure obtained. Time Out: Verified patient identification, verified procedure, site/side was marked, verified correct patient position, special equipment/implants available, medications/allergies/relevent history reviewed, required imaging and test results available.  Performed  Maximum sterile technique was used including antiseptics, gloves, gown, hand hygiene and mask. Skin prep: Chlorhexidine; local anesthetic administered A antimicrobial bonded/coated triple lumen catheter was placed in the right subclavian vein using the Seldinger technique.  Evaluation Blood flow good Complications: No apparent complications Patient did tolerate procedure well. Chest X-ray ordered to verify placement.  CXR: pending.  Alleen Borne 08/07/2011, 6:04 PM

## 2011-08-07 NOTE — Progress Notes (Signed)
Patient ID: [name], female   DOB: 1921/02/13, 78 y.o.   MRN: [id number]  Filed Vitals:   08/07/11 1400 08/07/11 1415 08/07/11 1430 08/07/11 1509  BP: 118/50 132/28    Pulse: 100 98 96   Temp:    97.3 F (36.3 C)  TempSrc:    Oral  Resp: 23 23 20    Height:      Weight:      SpO2: 94% 93% 95%   neo at 34mcg/min Awake and alert  Sleeve leaking this am so dopamine stopped and neo switched to peripheral. IV therapy could not find a vein for PICC.  Urine output 20/hr.

## 2011-08-07 NOTE — Progress Notes (Addendum)
Patient ID: [name], female   DOB: August 19, 1920, 78 y.o.   MRN: [id number] Subjective:  Awake- sore but breathing easier Remains on Neo   Objective:  Vital Signs in the last 24 hours: Temp:  [97.2 F (36.2 C)-97.9 F (36.6 C)] 97.5 F (36.4 C) (01/27 0706) Pulse Rate:  [67-115] 94  (01/27 1015) Resp:  [14-31] 18  (01/27 1015) BP: (70-133)/(26-73) 120/41 mmHg (01/27 1015) SpO2:  [82 %-99 %] 95 % (01/27 1015) Arterial Line BP: (82-127)/(30-88) 126/30 mmHg (01/27 0630) Weight:  [97.7 kg (215 lb 6.2 oz)] 97.7 kg (215 lb 6.2 oz) (01/27 0630)  Intake/Output from previous day: 01/26 0701 - 01/27 0700 In: 360 [P.O.:240; I.V.:120] Out: 1080 [Urine:740; Drains:155; Chest Tube:185] Intake/Output from this shift:    Physical Exam: General appearance: Happy, sore, NAD. Neck: no adenopathy, no carotid bruit, no JVD, supple, symmetrical, trachea midline and thyroid not enlarged, symmetric, no tenderness/mass/nodules Lungs: clear to auscultation bilaterally and in anterior & lateral lung fields with mild basal rales. Heart: S1, S2 normal and no S3 or S4; + rub post-op; 1/6 SM Abdomen: soft, non-tender; bowel sounds normal; no masses,  no organomegaly Neurologic: Mental status: A&O x3   . acetaminophen (TYLENOL) oral liquid 160 mg/5 mL  650 mg Per Tube NOW   Or  . acetaminophen  650 mg Rectal NOW  . acetaminophen  1,000 mg Oral Q6H   Or  . acetaminophen (TYLENOL) oral liquid 160 mg/5 mL  975 mg Per Tube Q6H  . allopurinol  100 mg Oral Daily  . aspirin EC  325 mg Oral Daily   Or  . aspirin  324 mg Per Tube Daily  . bisacodyl  10 mg Oral Daily   Or  . bisacodyl  10 mg Rectal Daily  . cefUROXime (ZINACEF)  IV  1.5 g Intravenous To OR  . cefUROXime (ZINACEF)  IV  1.5 g Intravenous Q12H  . chlorhexidine  60 mL Topical Once  . chlorhexidine  60 mL Topical Once  . colesevelam  1,875 mg Oral BID WC  . dextrose  13 mL Intravenous Once  . docusate sodium  200 mg Oral Daily  .  ezetimibe  10 mg Oral Daily  . famotidine (PEPCID) IV  20 mg Intravenous Q12H  . insulin regular  0-10 Units Intravenous TID WC  . levothyroxine  100 mcg Oral QAC breakfast  . magnesium sulfate  4 g Intravenous Once  . metoprolol tartrate  12.5 mg Oral BID   Or  . metoprolol tartrate  12.5 mg Per Tube BID  . metoprolol tartrate  12.5 mg Oral Once  . nitroglycerin/verapamil/heparin/sodium bicarbonate solution irrigation for artery spasm   Irrigation To OR  . pantoprazole  40 mg Oral Q1200  . potassium chloride  10 mEq Intravenous Q1 Hr x 3  . sodium chloride  3 mL Intravenous Q12H  . vancomycin (VANCOCIN) IVPB 1000 mg/100 mL central line  1,000 mg Intravenous Once  . vancomycin  1,500 mg Intravenous To OR     Lab Results:   Basename 08/07/11 0330 08/06/11 1825  WBC 20.8* 18.2*  HGB 9.7* 9.8*  PLT 95* 89*    Basename 08/07/11 0330 08/06/11 1825 08/06/11 1814 08/06/11 0400  NA 140 -- 141 --  K 4.3 -- 4.2 --  CL 106 -- 110 --  CO2 22 -- -- 22  GLUCOSE 121* -- 148* --  BUN 25* -- 22 --  CREATININE 1.50* 1.41* -- --   LABS REVIEWED: Renal  Fx stable, appropriate H/H increase s/p transfusion. Cardiac Studies: CABG x 3 (LIMA mid-distal LAD, SVG-OM, SVG- rPDA)  Assessment/Plan:  1) Angina -- with Multi -Vessel CAD (Severe Ostial LM, with h/o PTCA of 100% RCA in 05/2011) -- s/p CABG 08/05/2011 2) HLD 3) DM2 4) h/o near syncope 5) Htn 6) Hypothyroidism 7)CKD  POD #2 S/p difficult CABG x 3 (difficult to find adequate conduits) -- LIMA sequential to mid & distal LAD (due to mid LAD lesion); SVG-OM, SVG-rPDA  Recovering remarkably well.  Off Dopamine - low dose Neosynepherine. HR is in 90s-100s, would add back BB as BP stablizes.  She has not had PACs - making Afib less likely.    Agree with holding off diuresis until BP stable off pressors OOB to chair & plan to walk today.  Plan for RIJ line removal today - PICC placement as she is still requiring Neosynepherine.  DM -  minimal supplemental Insulin requirement. Not on statin @ home - due to rash - was on WelChol & Zetia.  Would restart these prior to discharge.  We will follow.  --  LOS: 8 days    Karime Scheuermann W 08/07/2011, 10:44 AM

## 2011-08-07 NOTE — Progress Notes (Signed)
Assess PT for PICC line.  No suitable veins.  Rt and Lt arm cephalic and brachial veins PICC would occupy more than 100% of vein.  Unable to find RT or LT basilic vein.  RN to notify MD.

## 2011-08-08 ENCOUNTER — Inpatient Hospital Stay (HOSPITAL_COMMUNITY): Payer: Medicare Other

## 2011-08-08 LAB — GLUCOSE, CAPILLARY
Glucose-Capillary: 124 mg/dL — ABNORMAL HIGH (ref 70–99)
Glucose-Capillary: 138 mg/dL — ABNORMAL HIGH (ref 70–99)
Glucose-Capillary: 139 mg/dL — ABNORMAL HIGH (ref 70–99)

## 2011-08-08 LAB — POCT I-STAT 3, ART BLOOD GAS (G3+)
O2 Saturation: 92 %
TCO2: 24 mmol/L (ref 0–100)
pCO2 arterial: 42.9 mmHg (ref 35.0–45.0)
pH, Arterial: 7.326 — ABNORMAL LOW (ref 7.350–7.400)
pO2, Arterial: 69 mmHg — ABNORMAL LOW (ref 80.0–100.0)

## 2011-08-08 LAB — CBC
HCT: 28 % — ABNORMAL LOW (ref 36.0–46.0)
Hemoglobin: 9.2 g/dL — ABNORMAL LOW (ref 12.0–15.0)
MCHC: 32.9 g/dL (ref 30.0–36.0)
WBC: 20.8 10*3/uL — ABNORMAL HIGH (ref 4.0–10.5)

## 2011-08-08 LAB — BASIC METABOLIC PANEL
BUN: 33 mg/dL — ABNORMAL HIGH (ref 6–23)
CO2: 24 mEq/L (ref 19–32)
Chloride: 104 mEq/L (ref 96–112)
Glucose, Bld: 128 mg/dL — ABNORMAL HIGH (ref 70–99)
Potassium: 4.3 mEq/L (ref 3.5–5.1)

## 2011-08-08 MED ORDER — DEXTROSE 5 % IV SOLN
150.0000 mg | Freq: Once | INTRAVENOUS | Status: AC
  Administered 2011-08-08: 150 mg via INTRAVENOUS
  Filled 2011-08-08: qty 3

## 2011-08-08 MED ORDER — FUROSEMIDE 10 MG/ML IJ SOLN
40.0000 mg | Freq: Once | INTRAMUSCULAR | Status: AC
  Administered 2011-08-08: 40 mg via INTRAVENOUS
  Filled 2011-08-08: qty 4

## 2011-08-08 MED ORDER — DIGOXIN 0.25 MG/ML IJ SOLN
0.2500 mg | Freq: Four times a day (QID) | INTRAMUSCULAR | Status: AC
  Administered 2011-08-08: 0.5 mg via INTRAVENOUS
  Administered 2011-08-08 – 2011-08-09 (×3): 0.25 mg via INTRAVENOUS
  Filled 2011-08-08 (×4): qty 1

## 2011-08-08 MED ORDER — TRAMADOL HCL 50 MG PO TABS: 50.0000 mg | ORAL_TABLET | Freq: Two times a day (BID) | ORAL | Status: DC | PRN

## 2011-08-08 NOTE — Progress Notes (Signed)
Patient ID: [name], female   DOB: March 08, 1921, 78 y.o.   MRN: [id number] TCTS DAILY PROGRESS NOTE                   [address]            [place] [id number]          [phone]      3 Days Post-Op Procedure(s) (LRB): CORONARY ARTERY BYPASS GRAFTING (CABG) (N/A)  Total Length of Stay:  LOS: 9 days   Subjective: Sedated, but does say she is at [place] and moves all extremities to command  Objective: Vital signs in last 24 hours: Temp:  [97.6 F (36.4 C)-98.5 F (36.9 C)] 97.6 F (36.4 C) (01/28 1148) Pulse Rate:  [31-158] 117  (01/28 1400) Cardiac Rhythm:  [-] Atrial fibrillation (01/28 1400) Resp:  [13-28] 22  (01/28 1400) BP: (73-141)/(28-97) 104/41 mmHg (01/28 1400) SpO2:  [86 %-99 %] 93 % (01/28 1400) FiO2 (%):  [5 %-6 %] 5 % (01/28 1400) Weight:  [215 lb 6.2 oz (97.7 kg)] 215 lb 6.2 oz (97.7 kg) (01/28 0500)  Filed Weights   08/06/11 0430 08/07/11 0630 08/08/11 0500  Weight: 215 lb 13.3 oz (97.9 kg) 215 lb 6.2 oz (97.7 kg) 215 lb 6.2 oz (97.7 kg)    Weight change: 0 lb (0 kg)   Hemodynamic parameters for last 24 hours:    Intake/Output from previous day: 01/27 0701 - 01/28 0700 In: 1029.2 [P.O.:120; I.V.:879.2; NG/GT:30] Out: 715 [Urine:550; Drains:165]  Intake/Output this shift: Total I/O In: 394.7 [P.O.:60; I.V.:334.7] Out: 269 [Urine:215; Drains:54]  Current Meds: Scheduled Meds:   . acetaminophen  1,000 mg Oral Q6H   Or  . acetaminophen (TYLENOL) oral liquid 160 mg/5 mL  975 mg Per Tube Q6H  . allopurinol  100 mg Oral Daily  . amiodarone  150 mg Intravenous Once  . aspirin EC  325 mg Oral Daily   Or  . aspirin  324 mg Per Tube Daily  . bisacodyl  10 mg Oral Daily   Or  . bisacodyl  10 mg Rectal Daily  . colesevelam  1,875 mg Oral BID WC  . docusate sodium  200 mg Oral Daily  . ezetimibe  10 mg Oral Daily  . insulin aspart  0-24 Units Subcutaneous Q4H  . insulin glargine  30 Units Subcutaneous Q0700  .  levothyroxine  100 mcg Oral QAC breakfast  . pantoprazole  40 mg Oral Q1200  . sodium chloride  3 mL Intravenous Q12H   Continuous Infusions:   . sodium chloride    . amiodarone (NEXTERONE PREMIX) 360 mg/200 mL dextrose 30 mg/hr (08/08/11 1400)  . DOPamine 3 mcg/kg/min (08/07/11 1900)  . lactated ringers 20 mL/hr at 08/07/11 0700  . phenylephrine (NEO-SYNEPHRINE) Adult infusion Stopped (08/08/11 1300)  . DISCONTD: amiodarone (NEXTERONE PREMIX) 360 mg/200 mL dextrose     PRN Meds:.ondansetron (ZOFRAN) IV, sodium chloride, traMADol, DISCONTD: morphine, DISCONTD: oxyCODONE, DISCONTD: traMADol  General appearance: distracted, fatigued, no distress and slowed mentation Neurologic: intact Heart: irregularly irregular rhythm Lungs: diminished breath sounds bilaterally Abdomen: soft, non-tender; bowel sounds normal; no masses,  no organomegaly Extremities: extremities normal, atraumatic, no cyanosis or edema and Homans sign is negative, no sign of DVT  Lab Results: CBC: Basename 08/08/11 0438 08/07/11 0330  WBC 20.8* 20.8*  HGB 9.2* 9.7*  HCT 28.0* 28.7*  PLT 127* 95*   BMET:  Basename 08/08/11 0438 08/07/11 0330  NA 137  140  K 4.3 4.3  CL 104 106  CO2 24 22  GLUCOSE 128* 121*  BUN 33* 25*  CREATININE 1.76* 1.50*  CALCIUM 9.5 9.3    PT/INR: No results found for this basename: LABPROT,INR in the last 72 hours Radiology: Dg Chest Port 1 View  08/08/2011  *RADIOLOGY REPORT*  Clinical Data: Coronary artery disease.  Status post CABG.  PORTABLE CHEST - 1 VIEW  Comparison: 08/07/2011  Findings: Central line remains in good position.  Slight bibasilar atelectasis, left greater than right with small left effusion. Slightly better aeration at the left base.  IMPRESSION: Slight bibasilar atelectasis, left greater than right, with a small left effusion.  Original Report Authenticated By: Gwynn Burly, M.D.   Dg Chest Port 1 View  08/07/2011  *RADIOLOGY REPORT*  Clinical Data: Central  line placement.  Hypertension.  Diabetes. Postoperative day two for CABG  PORTABLE CHEST - 1 VIEW  Comparison: 08/07/2011  Findings: Right central line tip projects over the superior vena cava.  No pneumothorax is observed. The right IJ introducer sheath has been removed.  Low lung volumes are present, causing crowding of the pulmonary vasculature.  Airspace opacity obscures the left lung base. Indistinct hazy opacity is present in both lungs.  Cardiomegaly noted.  Prior CABG noted.  IMPRESSION:  1.  Right-sided central line tip:  SVC.  No pneumothorax. 2.  Continued indistinct bilateral airspace opacities and low lung volumes. 3.  The right IJ introducer sheath has been removed.  Original Report Authenticated By: Dellia Cloud, M.D.   Dg Chest Portable 1 View In Am  08/07/2011  *RADIOLOGY REPORT*  Clinical Data: Postop, cardiac surgery  PORTABLE CHEST - 1 VIEW  Comparison: Chest radiograph 08/06/2011  Findings: Interval removal of mediastinal drain and left chest tube.  Retraction of Swan-Ganz catheter.  Right IJ sheath remains. Sternotomy wires overlie stable enlarged heart silhouette.  There is bibasilar atelectasis and small left effusion not changed.  No pneumothorax.  IMPRESSION:  1.  No evidence pneumothorax following chest tube removal. 2.  Stable cardiomegaly, low lung volumes, and left effusion.  Original Report Authenticated By: Genevive Bi, M.D.     Assessment/Plan: S/P Procedure(s) (LRB): CORONARY ARTERY BYPASS GRAFTING (CABG) (N/A) Mobilize Diuresis Continue foley due to diuresing patient Continued AFIB will give addition Cordarone Will try to diuresis, so far BP has not allowed much      [name] MD  Beeper [phone] Office [phone] 08/08/2011 3:28 PM

## 2011-08-08 NOTE — Evaluation (Addendum)
Occupational Therapy Evaluation Patient Details Name: LEAHA CUERVO MRN: [id number] DOB: 06/03/1921 Today's Date: 08/08/2011  Problem List:  Patient Active Problem List  Diagnoses  . CAD, multiple vessel, severe disease, no stents placed only angioplasty to mid RCA.   Marland Kitchen Hypertension, uncontrolled on arrival 07/30/11  . Dyslipidemia  . Hypothyroidism  . Renal insufficiency  . Diabetes type 2, controlled  . History of gout  . Drug reaction  . Episodic lightheadedness, with near syncope X 3  . Racing heart beat, episodically  . Chest pain at rest, possible cardiac.  . S/P CABG x 3    Past Medical History:  Past Medical History  Diagnosis Date  . Diabetes mellitus   . Hypertension   . PONV (postoperative nausea and vomiting)   . Angina   . Shortness of breath   . Chronic kidney disease   . Hypothyroidism   . Headache   . Arthritis   . Anxiety   . Myocardial infarction   . Pneumonia   . Episodic lightheadedness, with near syncope X 3 07/30/2011  . Coronary artery disease   . S/P CABG x 3 08/05/2011    LIMA - sequential prox-mid LAD, SVG-OM, SVG- RPDA   Past Surgical History:  Past Surgical History  Procedure Date  . Joint replacement     both hips replaced  . Cardiac catheterization   . Coronary angioplasty     OT Assessment/Plan/Recommendation OT Assessment Clinical Impression Statement: Pt. admitted with chest pain and dizziness and had recent admission with POBG, is now s/p CABG complicated by slight difficulty weaning off vent and anemia. Patient presents with below problem list and will benefit from skilled OT in the acute setting followed by additional rehab (SNF) to maximize I with ADL and ADL mobility to Min A to Mod I level in order to facilitate safe d/c home. OT Recommendation/Assessment: Patient will need skilled OT in the acute care venue OT Problem List: Decreased strength;Decreased range of motion;Decreased activity tolerance;Impaired balance (sitting  and/or standing);Decreased coordination;Decreased cognition;Decreased knowledge of use of DME or AE;Decreased knowledge of precautions;Cardiopulmonary status limiting activity;Impaired UE functional use;Increased edema OT Therapy Diagnosis : Generalized weakness;Cognitive deficits OT Plan OT Frequency: Min 1X/week OT Treatment/Interventions: Self-care/ADL training;Therapeutic exercise;Energy conservation;DME and/or AE instruction;Therapeutic activities;Cognitive remediation/compensation;Patient/family education;Balance training OT Recommendation Follow Up Recommendations: Skilled nursing facility Equipment Recommended: Defer to next venue Individuals Consulted Consulted and Agree with Results and Recommendations: Patient OT Goals Acute Rehab OT Goals OT Goal Formulation: With patient Time For Goal Achievement: 2 weeks ADL Goals Pt Will Perform Eating: with modified independence;Sitting, chair;Supported ADL Goal: Eating - Progress: Goal set today Pt Will Perform Grooming: with supervision;Standing at sink ADL Goal: Grooming - Progress: Goal set today Pt Will Perform Upper Body Dressing: with min assist;Sitting, chair;Supported ADL Goal: Location manager Dressing - Progress: Goal set today Pt Will Perform Lower Body Dressing: with mod assist;Sit to stand from chair ADL Goal: Lower Body Dressing - Progress: Goal set today Pt Will Transfer to Toilet: with min assist;with DME;3-in-1;Ambulation ADL Goal: Toilet Transfer - Progress: Goal set today Pt Will Perform Toileting - Hygiene: with supervision;Sit to stand from 3-in-1/toilet ADL Goal: Toileting - Hygiene - Progress: Goal set today Additional ADL Goal #1: Pt will be I with bilateral UE edema control techniques/exercises. ADL Goal: Additional Goal #1 - Progress: Goal set today  OT Evaluation Precautions/Restrictions  Precautions Precautions: Fall (Simultaneous filing. User may not have seen previous data.) Precaution Comments: watch HR  closely, gets tacky with  activity Restrictions Weight Bearing Restrictions: No Other Position/Activity Restrictions: Sternal precautions Prior Functioning Home Living Lives With: Spouse (Simultaneous filing. User may not have seen previous data.) Receives Help From: Family (Spouse unable to care for pt) Type of Home: House Home Layout: One level Home Access: Stairs to enter (Simultaneous filing. User may not have seen previous data.) Entrance Stairs-Rails:  (to be determined) Entrance Stairs-Number of Steps: not able to indicate Bathroom Shower/Tub: Health visitor: Standard Home Adaptive Equipment: Built-in shower seat;Bedside commode/3-in-1;Walker - rolling Additional Comments: Pt did not make any verbalizations during session; very lethargic; only answered yes/no questions with nod/shaking head, and even then not totally consistently; will need to verify PLOF and home situation Prior Function Level of Independence: Independent with basic ADLs;Independent with homemaking with ambulation Able to Take Stairs?:  (unable to determine) Driving: No Comments: Patient indicated that husband is unable to provide great than Min guard/Min A- will need to verify ADL ADL Eating/Feeding: +1 Total assistance;Simulated Eating/Feeding Details (indicate cue type and reason): Unable to make fist in order to hold any untensils. At this point, pt would require built up handles to grasp any utensil Where Assessed - Eating/Feeding: Chair Grooming: Performed;Wash/dry face;Set up;Minimal assistance Grooming Details (indicate cue type and reason): Maximal encouragement and cueing  Where Assessed - Grooming: Sitting, chair Upper Body Bathing: Simulated;+1 Total assistance Where Assessed - Upper Body Bathing: Sitting, chair Lower Body Bathing: Simulated;+1 Total assistance Where Assessed - Lower Body Bathing: Sitting, chair Upper Body Dressing: Simulated;+1 Total assistance Where Assessed -  Upper Body Dressing: Sitting, chair Lower Body Dressing: Simulated;+1 Total assistance Where Assessed - Lower Body Dressing: Sitting, chair Toilet Transfer: Simulated;+2 Total assistance (pt=60%) Toilet Transfer Details (indicate cue type and reason): sit to stand from chair with bil UE's on thighs and VC for upright standing/posture.  Toilet Transfer Method: Ambulating Tub/Shower Transfer: Not assessed Ambulation Related to ADLs: sit to stand from chair with bil UE's on thighs and VC for upright standing/posture. Patient initially with heavy posterior lean, but able to correct with VC's. Took ~4steps forward and backward with +2 Assist. Vision/Perception  Vision - History Baseline Vision:  (Unable to determine) Patient Visual Report:  (Unable to assess) Vision - Assessment Vision Assessment: Vision not tested [employer] Orientation Level: Oriented X4;Disoriented to person;Disoriented to place Cognition - Other Comments: unable to assess secondary to patient inconsistently answering yes/no questions with slight head nods and shakes. pt extremely difficult to arouse Sensation/Coordination Sensation Light Touch:  (unable to test) Additional Comments: Moderate edema noted in bilateral UE's Coordination Gross Motor Movements are Fluid and Coordinated: No Fine Motor Movements are Fluid and Coordinated: No Coordination and Movement Description: limited greatly by weakness and edema Extremity Assessment RUE Strength RUE Overall Strength Comments: Moderate edema noted shoulder and distally, worse in hand. 3+/5 grossly; 2+/5 shoulder LUE Strength LUE Overall Strength Comments: Moderate edema noted shoulder and distally, worse in hand. 3+/5 grossly; 2+/5 shoulder Mobility  Bed Mobility Bed Mobility: No Transfers Sit to Stand: 1: +2 Total assist;From chair/3-in-1 Sit to Stand Details (indicate cue type and reason): pt=60%. VC for hand placement on thighs to maintain sternal  precautions Exercises Shoulder Exercises Digit Composite Flexion: AROM;5 reps;Both;Seated Composite Extension: AROM;Both;5 reps;Seated End of Session OT - End of Session Equipment Utilized During Treatment: Gait belt Activity Tolerance: Patient limited by fatigue. HR 118-156 (with activity). BP stable throughout session Patient left: in chair;with call bell in reach Nurse Communication: Mobility status for transfers;Mobility status for ambulation General  Behavior During Session: Lethargic Cognition: Impaired   Dvaughn Fickle 08/08/2011, 10:35 AM

## 2011-08-08 NOTE — Progress Notes (Signed)
   CARE MANAGEMENT NOTE 08/08/2011  Patient:  [name], [name]   Account Number:  [id number]  Date Initiated:  08/08/2011  Documentation initiated by:  [facility]  Subjective/Objective Assessment:   post op CABG x4 on 08-05-11 after STEMI.  Has Children     Action/Plan:   PTA, PT INDEPENDENT, LIVES WITH SPOUSE.   Anticipated DC Date:  08/12/2011   Anticipated DC Plan:  HOME W HOME HEALTH SERVICES  In-house referral  Clinical Social Worker      DC Planning Services  CM consult      Choice offered to / List presented to:             Status of service:  In process, will continue to follow Medicare Important Message given?   (If response is "NO", the following Medicare IM given date fields will be blank) Date Medicare IM given:   Date Additional Medicare IM given:    Discharge Disposition:    Per UR Regulation:  Reviewed for med. necessity/level of care/duration of stay  Comments:  08/08/11 [name] Juhasz,RN,BSN 1430 PT SLOW TO PROGRESS S/P CAGB X 4.  PT AND OT RECOMMENDING SNF FOR REHAB AT DISCHARGE.  WILL CONSULT CSW TO FACILITATE DC TO SNF WHEN MEDICALLY STABLE. Phone #[phone]  08-08-11 2pm [name], RNBSN - [phone] UR completed.

## 2011-08-08 NOTE — Progress Notes (Signed)
Physical Therapy Evaluation Patient Details Name: [name] MRN: [id number] DOB: 08-06-1920 Today's Date: 08/08/2011  Problem List:  Patient Active Problem List  Diagnoses  . CAD, multiple vessel, severe disease, no stents placed only angioplasty to mid RCA.   Marland Kitchen Hypertension, uncontrolled on arrival 07/30/11  . Dyslipidemia  . Hypothyroidism  . Renal insufficiency  . Diabetes type 2, controlled  . History of gout  . Drug reaction  . Episodic lightheadedness, with near syncope X 3  . Racing heart beat, episodically  . Chest pain at rest, possible cardiac.  . S/P CABG x 3    Past Medical History:  Past Medical History  Diagnosis Date  . Diabetes mellitus   . Hypertension   . PONV (postoperative nausea and vomiting)   . Angina   . Shortness of breath   . Chronic kidney disease   . Hypothyroidism   . Headache   . Arthritis   . Anxiety   . Myocardial infarction   . Pneumonia   . Episodic lightheadedness, with near syncope X 3 07/30/2011  . Coronary artery disease   . S/P CABG x 3 08/05/2011    LIMA - sequential prox-mid LAD, SVG-OM, SVG- RPDA   Past Surgical History:  Past Surgical History  Procedure Date  . Joint replacement     both hips replaced  . Cardiac catheterization   . Coronary angioplasty     PT Assessment/Plan/Recommendation PT Assessment Clinical Impression Statement: 78yo femal s/p CABG presents with decr. functional mobility and impairments listed below; will benefit from acute PT to maximize independence and safety with mobility, transfers, amb, prep for rehab, and  facilitate dc planning to enable safe dc home when appropriate PT Recommendation/Assessment: Patient will need skilled PT in the acute care venue PT Problem List: Decreased strength;Decreased range of motion;Decreased activity tolerance;Decreased balance;Decreased mobility;Decreased coordination;Decreased cognition;Decreased knowledge of use of DME;Decreased safety awareness;Decreased  knowledge of precautions;Cardiopulmonary status limiting activity Barriers to Discharge: Decreased caregiver support PT Therapy Diagnosis : Difficulty walking;Abnormality of gait;Generalized weakness PT Plan PT Frequency: Min 3X/week PT Treatment/Interventions: DME instruction;Gait training;Functional mobility training;Therapeutic activities;Therapeutic exercise;Balance training;Cognitive remediation;Patient/family education PT Recommendation Recommendations for Other Services: OT consult Follow Up Recommendations: Skilled nursing facility Equipment Recommended: Defer to next venue PT Goals  Acute Rehab PT Goals PT Goal Formulation: Patient unable to participate in goal setting Time For Goal Achievement: 2 weeks Pt will Roll Supine to Right Side: with min assist PT Goal: Rolling Supine to Right Side - Progress: Goal set today Pt will Roll Supine to Left Side: with min assist PT Goal: Rolling Supine to Left Side - Progress: Goal set today Pt will go Supine/Side to Sit: with min assist PT Goal: Supine/Side to Sit - Progress: Goal set today Pt will go Sit to Supine/Side: with min assist PT Goal: Sit to Supine/Side - Progress: Goal set today Pt will go Sit to Stand: with min assist PT Goal: Sit to Stand - Progress: Goal set today Pt will go Stand to Sit: with min assist PT Goal: Stand to Sit - Progress: Goal set today Pt will Transfer Bed to Chair/Chair to Bed: with min assist PT Transfer Goal: Bed to Chair/Chair to Bed - Progress: Goal set today Pt will Ambulate: 51 - 150 feet;with min assist;with rolling walker PT Goal: Ambulate - Progress: Goal set today  PT Evaluation Precautions/Restrictions  Precautions Precautions: Fall (Simultaneous filing. User may not have seen previous data.) Precaution Comments: watch HR closely, gets tacky with activity Restrictions Weight  Bearing Restrictions: No Other Position/Activity Restrictions: Sternal precautions Prior Functioning  Home  Living Lives With: Spouse (Simultaneous filing. User may not have seen previous data.) Receives Help From: Family (Spouse unable to care for pt) Type of Home: House Home Layout: One level Home Access: Stairs to enter (Simultaneous filing. User may not have seen previous data.) Entrance Stairs-Rails:  (to be determined) Entrance Stairs-Number of Steps: not able to indicate Bathroom Shower/Tub: Health visitor: Standard Home Adaptive Equipment: Built-in shower seat;Bedside commode/3-in-1;Walker - rolling Additional Comments: Pt did not make any verbalizations during session; very lethargic; only answered yes/no questions with nod/shaking head, and even then not totally consistently; will need to verify PLOF and home situation Prior Function Level of Independence: Independent with basic ADLs;Independent with homemaking with ambulation Able to Take Stairs?:  (unable to determine) Driving: No Comments: Patient indicated that husband is unable to provide great than Min guard/Min A- will need to verify Cognition Cognition Orientation Level: Oriented X4;Disoriented to person;Disoriented to place Cognition - Other Comments: unable to assess secondary to patient inconsistently answering yes/no questions with slight head nods and shakes. pt extremely difficult to arouse Sensation/Coordination Sensation Light Touch:  (unable to test) Additional Comments: Moderate edema noted in bilateral UE's Coordination Gross Motor Movements are Fluid and Coordinated: No Fine Motor Movements are Fluid and Coordinated: No Coordination and Movement Description: limited greatly by weakness and edema Extremity Assessment RUE Strength RUE Overall Strength Comments: Moderate edema noted shoulder and distally, worse in hand. 3+/5 grossly; 2+/5 shoulder LUE Strength LUE Overall Strength Comments: Moderate edema noted shoulder and distally, worse in hand. 3+/5 grossly; 2+/5 shoulder RLE Assessment RLE  Assessment: Exceptions to [facility] RLE Strength RLE Overall Strength Comments: grossly decreased functional strength, requiring physical assist for sit to stand, and to control descent for stand to sit LLE Assessment LLE Assessment: Exceptions to [facility] LLE Strength LLE Overall Strength Comments: grossly decreased functional strength, requiring physical assist for sit to stand, and to control descent for stand to sit Mobility (including Balance) Bed Mobility Bed Mobility: No Transfers Sit to Stand: 1: +2 Total assist;From chair/3-in-1 Sit to Stand Details (indicate cue type and reason): pt=60%. VC for hand placement on thighs to maintain sternal precautions Ambulation/Gait Ambulation/Gait: Yes Ambulation/Gait Assistance: 1: +2 Total assist;Patient percentage (comment) (pt=60%) Ambulation/Gait Assistance Details (indicate cue type and reason): approx 2 feet forwards and backwards; short step length and height Ambulation Distance (Feet): 2 Feet Assistive device: 2 person hand held assist (3rd person assisting with managing lines/leads) Gait Pattern: Decreased step length - right;Decreased step length - left;Decreased stance time - right;Decreased stance time - left;Decreased stride length;Decreased hip/knee flexion - right;Decreased hip/knee flexion - left;Shuffle    End of Session PT - End of Session Equipment Utilized During Treatment: Gait belt Activity Tolerance: Treatment limited secondary to medical complications (Comment) (HR up to 156 observed highest with basic activity) Patient left: in chair;with call bell in reach Nurse Communication: Mobility status for transfers;Mobility status for ambulation (Noteworthy vitals during activity) General Behavior During Session: Lethargic Cognition: Impaired (basically without verbalization entire session)  Olen Pel [place], [place] [phone]  08/08/2011, 10:43 AM

## 2011-08-08 NOTE — Progress Notes (Signed)
UR Completed.  Adit Riddles Jane [phone] 08/08/2011  

## 2011-08-08 NOTE — Op Note (Signed)
NAMETENNILE, STYLES             ACCOUNT NO.:  [id number]  MEDICAL RECORD NO.:  [id number]  LOCATION:  2315                         FACILITY:  [facility]  PHYSICIAN:  Sheliah Plane, MD    DATE OF BIRTH:  Nov 13, 1920  DATE OF PROCEDURE:  08/05/2011 DATE OF DISCHARGE:                              OPERATIVE REPORT   PREOPERATIVE DIAGNOSIS:  Unstable angina with left main coronary obstruction and history of inferior myocardial infarction.  POSTOPERATIVE DIAGNOSIS:  Unstable angina with left main coronary obstruction and history of inferior myocardial infarction.  SURGICAL PROCEDURE:  Coronary artery bypass grafting x4 with the left internal mammary sequentially to the mid left anterior descending and distal left anterior descending, reverse saphenous vein graft to the circumflex coronary artery, reverse saphenous vein graft to the posterior descending coronary artery, with bilateral thigh endovein harvesting.  SURGEON:  Sheliah Plane, MD  FIRST ASSISTANT:  Lin Landsman PA.  BRIEF HISTORY:  The patient is an 78 year old female, who in November presented urgently with acute inferior myocardial infarction.  At that time, she underwent acute angioplasty of the right coronary artery.  At that time, she was noted to have a high-grade left main obstruction and also mid LAD obstruction.  The patient did well following angioplasty. At that time, consideration for bypass surgery versus stenting was considered and ultimately the patient, family, and physicians involved decided to continue with medical therapy.  She was readmitted a week prior to surgery with recurrent chest pain.  She also has known renal insufficiency.  After consideration of her both risks, options, and SDS score, coronary artery bypass grafting, higher risk was discussed with the patient, and she was agreeable.  Consideration for left main stenting was reviewed with Cardiology, however, because of the amount  of calcium in her left main was felt that it would require Rotablator and Cardiology declined to consider the risks and options including the worked increase risk both of morbidity and mortality because of renal insufficiency, and age was discussed with the patient.  She agreed and signed the informed consent.  DESCRIPTION OF PROCEDURE:  With Swan-Ganz and arterial line monitors in place, the patient underwent general endotracheal anesthesia without incident.  The skin of the chest and legs prepped with Betadine and draped in usual sterile manner.  The patient had very obese thighs. Previous vein mapping had been carried out, started in the right thigh and a segment of reverse saphenous vein graft was located and using the [employer] endovein harvesting system, a segment of vein was harvested. However, this was a branching system and was not sufficient.  Length of additional vein was then searched for in the left thigh with a small incision at the knee, however, no suitable vein was located.  We then went back to the right thigh and found an additional large side branch, which was last taken out with the scope and was suitable with 2 reasonable segments of vein.  We then proceed with sternotomy and dissected down the left internal mammary artery as a pedicle graft.  The distal artery had excellent flow.  Pericardium was opened.  Overall ventricular function appeared preserved.  The patient was systemically heparinized.  The ascending aorta  was cannulated.  The right atrium was cannulated and aortic root vent cardioplegia needle was introduced into the ascending aorta.  The patient was placed on cardiopulmonary bypass, 2.4 L/min/m2.  Sites of anastomosis were selected and dissected out of the epicardium.  The patient's body temp was cooled to 32 degrees. Aortic crossclamp was applied and 500 mL of cold blood potassium cardioplegia was administered through the aortic root.  With  rapid diastolic arrest of the heart, myocardial septal temperatures were monitored throughout the crossclamp.  Attention was turned first to the posterior descending coronary artery which arose high on the acute margin of the heart which supplied the inferior wall.  The vessel was 1.6-1.7 mm in size.  Using a running 7-0 Prolene, distal anastomosis was performed.  Attention was then turned to the circumflex coronary artery, which was a relatively small vessel, but admitted a 1.5 mm probe.  Using a running 7-0 Prolene, the distal anastomosis was performed.  Attention was then turned to the left anterior descending coronary which was opened in the mid vessel after the takeoff of the diagonal and 1.5 mm probe passed proximally.  Covering between the mid and distal LAD, there was a high-grade lesion where the probe would not pass through. Additionally, the distal LAD was a reasonable sized vessel and was opened.  The mammary was then sequentially placed into the mid LAD, side- to-side and to the distal LAD end-to-side each using running 8-0 Prolenes.  With the crossclamp still in place, 2 punch aortotomies were performed and each of the 2 vein grafts were anastomosed to the ascending aorta.  The bulldog was removed from the mammary artery with prompt rise in myocardial septal temperature.  Aortic crossclamp was removed with total crossclamp time of 75 minutes.  Sites of anastomosis. The patient required electric defibrillation and returned to a sinus rhythm.  To increase her rate, she was atrially paced.  While on bypass, she had been maintained on a low-dose dopamine.  She was then ventilated and weaned from cardiopulmonary bypass without difficulty.  She remained hemodynamically stable.  She was decannulated in the usual fashion. Protamine sulfate was administered with operative field hemostatic.  The left pleural tube and Blake mediastinal drain were left in place.  The pericardium was  loosely reapproximated.  Sternum closed with #6 stainless steel wire.  Fascia closed with interrupted 0-Vicryl, running 3-0 Vicryl and subcutaneous tissue, 4-0 subcuticular stitch in skin edges.  Dry dressings were applied.  Sponge and needle count was reported as correct at the completion of the procedure.  The patient tolerated the procedure without obvious complication and was transferred to the surgical Intensive Care Unit for further postoperative care.     Sheliah Plane, MD     EG/MEDQ  D:  08/08/2011  T:  08/08/2011  Job:  [id number]  cc:   Italy Hilty, MD

## 2011-08-08 NOTE — Progress Notes (Addendum)
[facility]  Subjective: Patient awake and moaning. Afib began yesterday ==> Amiodarone bolus & gtt started. Off Neo - changed to Dopamine for  "Renal perfusion" as Cr increased to 1.7  Objective: Vital signs in last 24 hours: Temp:  [97.3 F (36.3 C)-98.5 F (36.9 C)] 97.7 F (36.5 C) (01/28 0723) Pulse Rate:  [31-158] 129  (01/28 0900) Resp:  [13-28] 19  (01/28 0900) BP: (73-141)/(28-97) 115/52 mmHg (01/28 0900) SpO2:  [86 %-99 %] 96 % (01/28 0900) FiO2 (%):  [6 %] 6 % (01/28 0900) Weight:  [97.7 kg (215 lb 6.2 oz)] 97.7 kg (215 lb 6.2 oz) (01/28 0500) Last BM Date: 08/04/11  Intake/Output from previous day: 01/27 0701 - 01/28 0700 In: 1029.2 [P.O.:120; I.V.:879.2; NG/GT:30] Out: 715 [Urine:550; Drains:165] Intake/Output this shift: Total I/O In: 106.4 [I.V.:106.4] Out: 60 [Urine:60]  Medications Current Facility-Administered Medications  Medication Dose Route Frequency Provider Last Rate Last Dose  . 0.9 %  sodium chloride infusion   Intravenous Continuous Ardelle Balls, PA      . acetaminophen (TYLENOL) tablet 1,000 mg  1,000 mg Oral Q6H Ardelle Balls, PA   1,000 mg at 08/08/11 0541   Or  . acetaminophen (TYLENOL) solution 975 mg  975 mg Per Tube Q6H Ardelle Balls, PA   975 mg at 08/06/11 0047  . allopurinol (ZYLOPRIM) tablet 100 mg  100 mg Oral Daily Leone Brand, NP   100 mg at 08/08/11 1025  . amiodarone (NEXTERONE PREMIX) 360 mg/200 mL dextrose IV infusion  60 mg/hr Intravenous Continuous Alleen Borne, MD 16.7 mL/hr at 08/08/11 0900 30 mg/hr at 08/08/11 0900  . amiodarone (NEXTERONE) IV bolus only 150 mg/100 mL  150 mg Intravenous Once Alleen Borne, MD   150 mg at 08/07/11 1745  . aspirin EC tablet 325 mg  325 mg Oral Daily Ardelle Balls, PA   325 mg at 08/08/11 1026   Or  . aspirin chewable tablet 324 mg  324 mg Per Tube Daily [name] Margaretann Loveless, PA      . bisacodyl (DULCOLAX) EC tablet 10 mg  10  mg Oral Daily Ardelle Balls, PA   10 mg at 08/08/11 1026   Or  . bisacodyl (DULCOLAX) suppository 10 mg  10 mg Rectal Daily Ardelle Balls, PA      . colesevelam [facility]) tablet 1,875 mg  1,875 mg Oral BID WC Leone Brand, NP   1,875 mg at 08/08/11 0815  . docusate sodium (COLACE) capsule 200 mg  200 mg Oral Daily Ardelle Balls, PA   200 mg at 08/08/11 1026  . DOPamine (INTROPIN) 800 mg in dextrose 5 % 250 mL infusion  3 mcg/kg/min Intravenous Continuous Ardelle Balls, PA 5.2 mL/hr at 08/07/11 1900 3 mcg/kg/min at 08/07/11 1900  . ezetimibe (ZETIA) tablet 10 mg  10 mg Oral Daily Leone Brand, NP   10 mg at 08/08/11 1025  . insulin aspart (novoLOG) injection 0-24 Units  0-24 Units Subcutaneous Q4H Alleen Borne, MD   2 Units at 08/08/11 0815  . insulin glargine (LANTUS) injection 30 Units  30 Units Subcutaneous Q0700 Alleen Borne, MD   30 Units at 08/08/11 [phone]  . lactated ringers infusion   Intravenous Continuous Ardelle Balls, PA 20 mL/hr at 08/07/11 0700    . levothyroxine (SYNTHROID, LEVOTHROID) tablet 100 mcg  100 mcg Oral QAC breakfast Leone Brand, NP   100 mcg  at 08/08/11 0815  . ondansetron (ZOFRAN) injection 4 mg  4 mg Intravenous Q6H PRN Ardelle Balls, PA   4 mg at 08/06/11 1345  . pantoprazole (PROTONIX) EC tablet 40 mg  40 mg Oral Q1200 Ardelle Balls, PA   40 mg at 08/07/11 1139  . phenylephrine (NEO-SYNEPHRINE) 20,000 mcg in dextrose 5 % 250 mL infusion  0-100 mcg/min Intravenous Continuous Ardelle Balls, PA 11.3 mL/hr at 08/08/11 0130 15 mcg/min at 08/08/11 0130  . sodium chloride 0.9 % injection 3 mL  3 mL Intravenous Q12H Ardelle Balls, PA   3 mL at 08/08/11 1026  . sodium chloride 0.9 % injection 3 mL  3 mL Intravenous PRN Ardelle Balls, PA      . traMADol Janean Sark) tablet 50 mg  50 mg Oral Q12H PRN [name], MD      . DISCONTD: amiodarone (NEXTERONE PREMIX) 360 mg/200 mL dextrose IV infusion  60  mg/hr Intravenous Continuous Alleen Borne, MD      . DISCONTD: morphine 4 MG/ML injection 2-5 mg  2-5 mg Intravenous Q1H PRN Ardelle Balls, PA   2 mg at 08/06/11 1641  . DISCONTD: oxyCODONE (Oxy IR/ROXICODONE) immediate release tablet 5-10 mg  5-10 mg Oral Q3H PRN Ardelle Balls, PA      . DISCONTD: traMADol Janean Sark) tablet 50 mg  50 mg Oral Q6H PRN Ardelle Balls, PA   50 mg at 08/07/11 0612   PE: General appearance: alert and moderate distress Lungs: Decreased BS bialterally - bilateral rales with intermittent upper airway wheezing Heart: irregularly irregular rhythm, rate fast. Extremities: ~1+ LEE Pulses: 2+ and symmetric  Lab Results:   Basename 08/08/11 0438 08/07/11 0330 08/06/11 1825  WBC 20.8* 20.8* 18.2*  HGB 9.2* 9.7* 9.8*  HCT 28.0* 28.7* 29.4*  PLT 127* 95* 89*   BMET  Basename 08/08/11 0438 08/07/11 0330 08/06/11 1825 08/06/11 1814 08/06/11 0400  NA 137 140 -- 141 --  K 4.3 4.3 -- 4.2 --  CL 104 106 -- 110 --  CO2 24 22 -- -- 22  GLUCOSE 128* 121* -- 148* --  BUN 33* 25* -- 22 --  CREATININE 1.76* 1.50* 1.41* -- --  CALCIUM 9.5 9.3 -- -- 9.6   PT/INR  Basename 08/05/11 1410  LABPROT 20.5*  INR 1.72*   Studies/Results: PORTABLE CHEST - 1 VIEW  Comparison: 08/07/2011  Findings: Central line remains in good position. Slight bibasilar  atelectasis, left greater than right with small left effusion.  Slightly better aeration at the left base.  IMPRESSION:  Slight bibasilar atelectasis, left greater than right, with a small  left effusion.  Assessment/Plan   Principal Problem:  *Chest pain at rest, possible cardiac. Active Problems:  CAD, multiple vessel, severe disease, no stents placed only angioplasty to mid RCA.   Dyslipidemia  Diabetes type 2, controlled  Episodic lightheadedness, with near syncope X 3  Racing heart beat, episodically  S/P CABG x 3              LIMA sequential to mid & distal LAD (due to mid LAD lesion);  SVG-OM, SVG-rPDA Post-op Afib RVR (with borderline Hypotension)  Plan: POD#3 CABG x3(LIMA sequential to mid & distal LAD (due to mid LAD lesion); SVG-OM, SVG-rPDA).   Cr increased.  Anemia- Hgb slight decrease form yesterday.    She appears to be in a moderate amount of pain not answering questions but moaning responses.   Still on pressors.  Afib RVR - on Amiodarone.   Labile BP   LOS: 9 days    HAGER,BRYAN W 08/08/2011 10:59 AM  I have seen and examined the patient along with Wilburt Finlay, PA.  I have reviewed the chart, notes and new data.  I agree with Bryan's note.  Key new complaints: having a hard time breathing, nausea, fuzzy vision (although doesn't have glasses on) Key examination changes: bibasal rales & wheezing, mild edema -- likely due to volume overload Key new findings / data: Cr increase, Hgb mildly decreased.  PLAN: Post-op AFib - we are limited for options here.  With worsening renal function, would be reluctant to use Digoxin for fear of toxicity - but the IV load of x 1 followed by 2 doses of IV q 6 hrs may help with resting HR -- her resting NSR rate was in 90s - for the Afib rate is not to far off what would be expect.  The other option is re-load Amiodarone 150mg  bolus (just give over 1hr to avoid hypotension).  When able to wean of dopamine - could add BB, but BP not yet stable enough.  Volume overload - with subjective dyspnea & rales on exam, would consider gentle diuresis, I suspect that he bp may drop a bit, but should not be dramatic.  Suspect the decreased Hgb may be partially dilutional.  Will check back in later on today.  Critical Care Time 15 min  Jarelle Ates W, M.D., M.S. [facility] [address] [place], [place]  [address]  [phone]  08/08/2011 2:06 PM  I came by to check in on her -- looks a bit perkier, sitting up in chair.  BP better, but HR still Afib RVR - Amiodarone re-bolus  given without significant effect.  Lasix given - no notable effect as yet. I discussed options with Dr. Tyrone Sage, we agreed on digoxin loading. To consider DCCV - would need to discuss anticoagulation, which Dr. Tyrone Sage is not in favor of.  This leaves medications as the better option.  Could decrease Dopamine to ~44mcg to decreased adrenergic drive.  Critical Care Time 5 min.  Marykay Lex, M.D., M.S. [facility] [address] [place], [place]  [address]  [phone]  08/08/2011 6:44 PM

## 2011-08-09 ENCOUNTER — Encounter (HOSPITAL_COMMUNITY): Payer: Self-pay | Admitting: Cardiothoracic Surgery

## 2011-08-09 ENCOUNTER — Inpatient Hospital Stay (HOSPITAL_COMMUNITY): Payer: Medicare Other

## 2011-08-09 LAB — BASIC METABOLIC PANEL
BUN: 39 mg/dL — ABNORMAL HIGH (ref 6–23)
CO2: 25 mEq/L (ref 19–32)
Calcium: 9.7 mg/dL (ref 8.4–10.5)
Chloride: 104 mEq/L (ref 96–112)
Creatinine, Ser: 1.78 mg/dL — ABNORMAL HIGH (ref 0.50–1.10)
GFR calc Af Amer: 28 mL/min — ABNORMAL LOW (ref >90)
GFR calc non Af Amer: 24 mL/min — ABNORMAL LOW (ref >90)
Glucose, Bld: 105 mg/dL — ABNORMAL HIGH (ref 70–99)
Potassium: 3.7 mEq/L (ref 3.5–5.1)
Sodium: 138 mEq/L (ref 135–145)

## 2011-08-09 LAB — CBC
HCT: 24.9 % — ABNORMAL LOW (ref 36.0–46.0)
Hemoglobin: 8.4 g/dL — ABNORMAL LOW (ref 12.0–15.0)
MCH: 27.2 pg (ref 26.0–34.0)
MCHC: 33.7 g/dL (ref 30.0–36.0)
MCV: 80.6 fL (ref 78.0–100.0)
Platelets: 143 10*3/uL — ABNORMAL LOW (ref 150–400)
RBC: 3.09 MIL/uL — ABNORMAL LOW (ref 3.87–5.11)
RDW: 16.4 % — ABNORMAL HIGH (ref 11.5–15.5)
WBC: 14 10*3/uL — ABNORMAL HIGH (ref 4.0–10.5)

## 2011-08-09 LAB — GLUCOSE, CAPILLARY
Glucose-Capillary: 121 mg/dL — ABNORMAL HIGH (ref 70–99)
Glucose-Capillary: 136 mg/dL — ABNORMAL HIGH (ref 70–99)

## 2011-08-09 MED ORDER — POTASSIUM CHLORIDE 10 MEQ/50ML IV SOLN
INTRAVENOUS | Status: AC
  Administered 2011-08-09: 10 meq via INTRAVENOUS
  Filled 2011-08-09: qty 50

## 2011-08-09 MED ORDER — LEVALBUTEROL HCL 0.63 MG/3ML IN NEBU
0.6300 mg | INHALATION_SOLUTION | Freq: Three times a day (TID) | RESPIRATORY_TRACT | Status: DC
  Administered 2011-08-09 – 2011-08-11 (×6): 0.63 mg via RESPIRATORY_TRACT
  Filled 2011-08-09 (×10): qty 3

## 2011-08-09 MED ORDER — DEXTROSE 5 % IV SOLN
150.0000 mg | Freq: Once | INTRAVENOUS | Status: AC
  Administered 2011-08-09: 150 mg via INTRAVENOUS

## 2011-08-09 MED ORDER — POTASSIUM CHLORIDE 10 MEQ/50ML IV SOLN
10.0000 meq | INTRAVENOUS | Status: AC | PRN
  Administered 2011-08-09 (×3): 10 meq via INTRAVENOUS
  Filled 2011-08-09: qty 100

## 2011-08-09 MED ORDER — FUROSEMIDE 10 MG/ML IJ SOLN
80.0000 mg | Freq: Once | INTRAMUSCULAR | Status: AC
  Administered 2011-08-09: 80 mg via INTRAVENOUS
  Filled 2011-08-09: qty 8

## 2011-08-09 MED ORDER — METOPROLOL TARTRATE 12.5 MG HALF TABLET
12.5000 mg | ORAL_TABLET | Freq: Two times a day (BID) | ORAL | Status: DC
  Administered 2011-08-09 – 2011-08-12 (×6): 12.5 mg via ORAL
  Filled 2011-08-09 (×7): qty 1

## 2011-08-09 MED ORDER — ENOXAPARIN SODIUM 30 MG/0.3ML ~~LOC~~ SOLN
30.0000 mg | SUBCUTANEOUS | Status: DC
  Administered 2011-08-09 – 2011-08-11 (×3): 30 mg via SUBCUTANEOUS
  Filled 2011-08-09 (×4): qty 0.3

## 2011-08-09 NOTE — Progress Notes (Signed)
Physical Therapy Treatment Patient Details Name: [name] MRN: [id number] DOB: 1921-01-24 Today's Date: 08/09/2011  PT Assessment/Plan  PT - Assessment/Plan Comments on Treatment Session: Pt s/p CABG and post op Afib. Pt progressing slowly but again limited today by Afib with activity. RN present and immediately medicated. Pt educated for sternal precautions, plan, and benefit of mobility. Pt would not state precautions end of session.  PT Plan: Discharge plan remains appropriate PT Frequency: Min 3X/week Follow Up Recommendations: Skilled nursing facility PT Goals  Acute Rehab PT Goals PT Goal: Rolling Supine to Right Side - Progress: Progressing toward goal PT Goal: Rolling Supine to Left Side - Progress: Progressing toward goal PT Goal: Supine/Side to Sit - Progress: Progressing toward goal PT Goal: Sit to Stand - Progress: Progressing toward goal Pt will go Stand to Sit: with supervision PT Goal: Stand to Sit - Progress: Updated due to goals met PT Transfer Goal: Bed to Chair/Chair to Bed - Progress: Progressing toward goal PT Goal: Ambulate - Progress: Progressing toward goal Additional Goals Additional Goal #1: Pt with independently verbalize 3/3 sternal precautions PT Goal: Additional Goal #1 - Progress: Goal set today  PT Treatment Precautions/Restrictions  Precautions Precautions: Sternal;Fall Precaution Comments: watch HR closely, gets tacky with activity Restrictions Weight Bearing Restrictions: No Other Position/Activity Restrictions: Sternal precautions Mobility (including Balance) Bed Mobility Bed Mobility: Yes Rolling Right: 1: +1 Total assist;With rail Rolling Right Details (indicate cue type and reason): Max cueing to sequence and initiate. Pt would not initiate knee flexion or trunk rotation without physical assist Right Sidelying to Sit: 1: +2 Total assist;HOB elevated (comment degrees) (HOB 30 degrees, pt = 20%) Right Sidelying to Sit Details  (indicate cue type and reason): cueing and assist to facilitate and move trunk and bil LE Sitting - Scoot to Edge of Bed: 2: Max assist Sitting - Scoot to [place] of Bed Details (indicate cue type and reason): pad used for reciprocal scooting Transfers Sit to Stand: 1: +2 Total assist;From bed (pt = 65%, bed elevated) Sit to Stand Details (indicate cue type and reason): cueing throughout for precautions and hand placement Stand to Sit: 4: Min assist;To chair/3-in-1 Stand to Sit Details: hand over hand assist for hands on thighs and to control descent to chair Ambulation/Gait Ambulation/Gait Assistance: 3: Mod assist Ambulation/Gait Assistance Details (indicate cue type and reason): assist to advance and direct RWwith manual cueing and assist to advance bil LE. +1 to follow behind with chair for safety. Majority of amb pt HR 100-110 then pt spiked up to 156 at highest and immediately had pt sit, RN aware and ended tx with pt in chair. Ambulation Distance (Feet): 24 Feet Assistive device: Rolling walker Gait Pattern: Decreased stride length;Trunk flexed Stairs: No Wheelchair Mobility Wheelchair Mobility: No  Posture/Postural Control Posture/Postural Control: Postural limitations Postural Limitations: flexed trunk Balance Balance Assessed: No Exercise    End of Session PT - End of Session Equipment Utilized During Treatment: Gait belt Activity Tolerance: Treatment limited secondary to medical complications (Comment) Patient left: in chair;with call bell in reach Nurse Communication: Mobility status for transfers;Mobility status for ambulation General Behavior During Session: Flat affect Cognition: Impaired Cognitive Impairment: Pt would not speak other than to say she was hot. Some head nodding throughout and when questioned pt nodded yes that she was scared to mobilize. Pt encouraged to voice pain, discomfort and generally communication so we may better help her. Pt would not state her  name or that of family members.  Guss Bunde,  Oluwanifemi Susman [name] 08/09/2011, 12:25 PM Toney Sang, PT [phone]

## 2011-08-09 NOTE — Progress Notes (Signed)
Unable to determine a course of action as none is requested.

## 2011-08-09 NOTE — Progress Notes (Signed)
Don't know what to do with this question.

## 2011-08-09 NOTE — Progress Notes (Signed)
Very weak getting up to chair, 2 person assist. Pt refusing pain meds, Pt states she is not in pain. Will continue to monitor.

## 2011-08-09 NOTE — Progress Notes (Signed)
Patient ID: [name], female   DOB: June 12, 1921, 78 y.o.   MRN: [id number]  Filed Vitals:   08/09/11 1700 08/09/11 1800 08/09/11 1900 08/09/11 1927  BP: 107/46 94/37 113/46   Pulse: 120 111 118   Temp:    97.8 F (36.6 C)  TempSrc:    Oral  Resp: 21 17 25    Height:      Weight:      SpO2: 96% 96% 95%    Urine output ok.  Stable day.

## 2011-08-09 NOTE — Progress Notes (Signed)
Pt. Seen and examined. Agree with the NP/PA-C note as written. Post-op Day 4, now in a-fib with RVR. She continues on low dose dopamine, amiodarone and has received digoxin loading. Would try to wean dopamine as soon as tolerated - doesn't appear she needs it for blood pressure, more for graft patency? Extremely weak, mumbling, but coherent.  Chrystie Nose, MD Attending Cardiologist [facility]

## 2011-08-09 NOTE — Progress Notes (Signed)
Pt from chair to bed with 2 person assist.  Became SOB.  Oxygen increased to 4L [place].  Pt able to recuperate fully after ~5 minutes rest.  Oxygen reduced to 2L [place]

## 2011-08-09 NOTE — Progress Notes (Signed)
Clinical Social Worker completed the psychosocial assessment which can be found in the shadow chart. CSW has initiated SNF search and willcontinue to follow to facilitate SNF placement when pt is medically ready. Baxter Flattery, MSW [phone]

## 2011-08-09 NOTE — Progress Notes (Signed)
Patient ID: [name], female   DOB: 24-Apr-1921, 78 y.o.   MRN: [id number] TCTS DAILY PROGRESS NOTE                   [address]            [place] [address]          [phone]      4 Days Post-Op Procedure(s) (LRB): CORONARY ARTERY BYPASS GRAFTING (CABG) (N/A)  Total Length of Stay:  LOS: 10 days   Subjective: Up to chair, weak dos not help much but can stand with help  Objective: Vital signs in last 24 hours: Temp:  [97.6 F (36.4 C)-97.8 F (36.6 C)] 97.6 F (36.4 C) (01/29 0723) Pulse Rate:  [81-131] 89  (01/29 0700) Cardiac Rhythm:  [-] Normal sinus rhythm (01/29 0600) Resp:  [9-27] 20  (01/29 0700) BP: (82-139)/(31-55) 116/31 mmHg (01/29 0700) SpO2:  [92 %-98 %] 96 % (01/29 0700) FiO2 (%):  [5 %-6 %] 5 % (01/28 1900) Weight:  [215 lb 6.2 oz (97.7 kg)] 215 lb 6.2 oz (97.7 kg) (01/29 0600)  Filed Weights   08/07/11 0630 08/08/11 0500 08/09/11 0600  Weight: 215 lb 6.2 oz (97.7 kg) 215 lb 6.2 oz (97.7 kg) 215 lb 6.2 oz (97.7 kg)    Weight change: 0 lb (0 kg)   Hemodynamic parameters for last 24 hours:    Intake/Output from previous day: 01/28 0701 - 01/29 0700 In: 1227 [P.O.:120; I.V.:1057; IV Piggyback:50] Out: 1034 [Urine:980; Drains:54]  Intake/Output this shift:    Current Meds: Scheduled Meds:   . acetaminophen  1,000 mg Oral Q6H   Or  . acetaminophen (TYLENOL) oral liquid 160 mg/5 mL  975 mg Per Tube Q6H  . allopurinol  100 mg Oral Daily  . amiodarone (NEXTERONE) IV bolus only 150 mg/100 mL  150 mg Intravenous Once  . aspirin EC  325 mg Oral Daily   Or  . aspirin  324 mg Per Tube Daily  . bisacodyl  10 mg Oral Daily   Or  . bisacodyl  10 mg Rectal Daily  . colesevelam  1,875 mg Oral BID WC  . digoxin  0.25 mg Intravenous QID  . docusate sodium  200 mg Oral Daily  . ezetimibe  10 mg Oral Daily  . furosemide  40 mg Intravenous Once  . furosemide  80 mg Intravenous Once  . insulin aspart  0-24 Units Subcutaneous  Q4H  . insulin glargine  30 Units Subcutaneous Q0700  . levalbuterol  0.63 mg Nebulization Q8H  . levothyroxine  100 mcg Oral QAC breakfast  . pantoprazole  40 mg Oral Q1200  . sodium chloride  3 mL Intravenous Q12H   Continuous Infusions:   . sodium chloride    . amiodarone (NEXTERONE PREMIX) 360 mg/200 mL dextrose 30 mg/hr (08/09/11 0700)  . DOPamine 3 mcg/kg/min (08/09/11 0700)  . lactated ringers 20 mL/hr at 08/07/11 0700  . phenylephrine (NEO-SYNEPHRINE) Adult infusion Stopped (08/08/11 1300)   PRN Meds:.ondansetron (ZOFRAN) IV, potassium chloride, sodium chloride, traMADol, DISCONTD: morphine, DISCONTD: oxyCODONE, DISCONTD: traMADol  General appearance: slowed mentation Neurologic: intact Heart: regular rate and rhythm, S1, S2 normal, no murmur, click, rub or gallop Lungs: diminished breath sounds bilaterally Extremities: extremities normal, atraumatic, no cyanosis or edema  Lab Results: CBC: Basename 08/09/11 0430 08/08/11 0438  WBC 14.0* 20.8*  HGB 8.4* 9.2*  HCT 24.9* 28.0*  PLT 143* 127*   BMET:  Basename 08/09/11  0430 08/08/11 0438  NA 138 137  K 3.7 4.3  CL 104 104  CO2 25 24  GLUCOSE 105* 128*  BUN 39* 33*  CREATININE 1.78* 1.76*  CALCIUM 9.7 9.5    PT/INR: No results found for this basename: LABPROT,INR in the last 72 hours Radiology: Dg Chest Port 1 View  08/08/2011  *RADIOLOGY REPORT*  Clinical Data: Coronary artery disease.  Status post CABG.  PORTABLE CHEST - 1 VIEW  Comparison: 08/07/2011  Findings: Central line remains in good position.  Slight bibasilar atelectasis, left greater than right with small left effusion. Slightly better aeration at the left base.  IMPRESSION: Slight bibasilar atelectasis, left greater than right, with a small left effusion.  Original Report Authenticated By: Gwynn Burly, M.D.   Dg Chest Port 1 View  08/07/2011  *RADIOLOGY REPORT*  Clinical Data: Central line placement.  Hypertension.  Diabetes. Postoperative day two  for CABG  PORTABLE CHEST - 1 VIEW  Comparison: 08/07/2011  Findings: Right central line tip projects over the superior vena cava.  No pneumothorax is observed. The right IJ introducer sheath has been removed.  Low lung volumes are present, causing crowding of the pulmonary vasculature.  Airspace opacity obscures the left lung base. Indistinct hazy opacity is present in both lungs.  Cardiomegaly noted.  Prior CABG noted.  IMPRESSION:  1.  Right-sided central line tip:  SVC.  No pneumothorax. 2.  Continued indistinct bilateral airspace opacities and low lung volumes. 3.  The right IJ introducer sheath has been removed.  Original Report Authenticated By: Dellia Cloud, M.D.     Assessment/Plan: S/P Procedure(s) (LRB): CORONARY ARTERY BYPASS GRAFTING (CABG) (N/A) Mobilize Diuresis Diabetes control  [name] MD  Beeper [phone] Office [phone] 08/09/2011 8:09 AM

## 2011-08-09 NOTE — Progress Notes (Signed)
PROCEDURE: CORONARY ARTERY BYPASS GRAFTING (CABG) x 4 (LIMA sequentially to mid and distal  LAD,SVG to distal Circumflex, and SVG to RCA) with EVH right thigh only   POD #4   Subjective: No specific complaints, doesn't really answer questions except by shaking head,  Was up in chair and rec'd bath. ++fatigue.  Became weak once she went into afib.  Now back in SR.  Poor appetite.     Objective: Vital signs in last 24 hours: Temp:  [97.6 F (36.4 C)-97.8 F (36.6 C)] 97.6 F (36.4 C) (01/29 0723) Pulse Rate:  [81-131] 83  (01/29 1000) Resp:  [9-27] 19  (01/29 1000) BP: (82-139)/(31-60) 111/47 mmHg (01/29 1000) SpO2:  [92 %-99 %] 96 % (01/29 1000) FiO2 (%):  [5 %] 5 % (01/28 1900) Weight:  [97.7 kg (215 lb 6.2 oz)] 97.7 kg (215 lb 6.2 oz) (01/29 0600) Weight change: 0 kg (0 lb) Last BM Date: 08/04/11 Intake/Output from previous day: +229 01/28 0701 - 01/29 0700 In: 1227 [P.O.:120; I.V.:1057; IV Piggyback:50] Out: 1084 [Urine:1030; Drains:54] Intake/Output this shift: Total I/O In: 115.7 [I.V.:65.7; IV Piggyback:50] Out: 230 [Urine:230]  Weight up from pre-op now 97.7  PE: General:Alert, oriented but very weak.  Attempts to squeeze hands but weak equally. Heart:S1S2 RRR Lungs:diminished, ant.  She does not take deep breaths currently.  Just back in bed Abd:+ BS, soft, non tender. Ext:no edema.  Tele:SR currently.   Lab Results:  Basename 08/09/11 0430 08/08/11 0438  WBC 14.0* 20.8*  HGB 8.4* 9.2*  HCT 24.9* 28.0*  PLT 143* 127*   BMET  Basename 08/09/11 0430 08/08/11 0438  NA 138 137  K 3.7 4.3  CL 104 104  CO2 25 24  GLUCOSE 105* 128*  BUN 39* 33*  CREATININE 1.78* 1.76*  CALCIUM 9.7 9.5   No results found for this basename: TROPONINI:2,CK,MB:2 in the last 72 hours  Lab Results  Component Value Date   CHOL 169 07/31/2011   HDL 68 07/31/2011   LDLCALC 88 07/31/2011   TRIG 64 07/31/2011   CHOLHDL 2.5 07/31/2011   Lab Results  Component Value Date   HGBA1C 5.9* 07/30/2011     Lab Results  Component Value Date   TSH 1.618 07/30/2011    Hepatic Function Panel No results found for this basename: PROT,ALBUMIN,AST,ALT,ALKPHOS,BILITOT,BILIDIR,IBILI in the last 72 hours No results found for this basename: CHOL in the last 72 hours No results found for this basename: PROTIME in the last 72 hours    EKG: Orders placed during the hospital encounter of 07/30/11  . EKG 12-LEAD  . EKG 12-LEAD  . EKG  . EKG 12-LEAD    Studies/Results: Dg Chest Port 1 View  08/09/2011  *RADIOLOGY REPORT*  Clinical Data: Cardiac surgery  PORTABLE CHEST - 1 VIEW  Comparison: Chest radiograph 07/31/2011  Findings: Sternotomy wires overlie stable enlarged heart silhouette.  Right saphenous line is in place.  Bilateral pleural effusions not changed from prior.  There are low lung volumes. Central venous congestion is also unchanged.  No pneumothorax.  IMPRESSION:  1.  No interval change. 2.  Cardiomegaly, bilateral effusions, basilar atelectasis and central venous congestion.  Original Report Authenticated By: Genevive Bi, M.D.   Dg Chest Port 1 View  08/08/2011  *RADIOLOGY REPORT*  Clinical Data: Coronary artery disease.  Status post CABG.  PORTABLE CHEST - 1 VIEW  Comparison: 08/07/2011  Findings: Central line remains in good position.  Slight bibasilar atelectasis, left greater than right with small left  effusion. Slightly better aeration at the left base.  IMPRESSION: Slight bibasilar atelectasis, left greater than right, with a small left effusion.  Original Report Authenticated By: Gwynn Burly, M.D.   Dg Chest Port 1 View  08/07/2011  *RADIOLOGY REPORT*  Clinical Data: Central line placement.  Hypertension.  Diabetes. Postoperative day two for CABG  PORTABLE CHEST - 1 VIEW  Comparison: 08/07/2011  Findings: Right central line tip projects over the superior vena cava.  No pneumothorax is observed. The right IJ introducer sheath has been removed.  Low lung  volumes are present, causing crowding of the pulmonary vasculature.  Airspace opacity obscures the left lung base. Indistinct hazy opacity is present in both lungs.  Cardiomegaly noted.  Prior CABG noted.  IMPRESSION:  1.  Right-sided central line tip:  SVC.  No pneumothorax. 2.  Continued indistinct bilateral airspace opacities and low lung volumes. 3.  The right IJ introducer sheath has been removed.  Original Report Authenticated By: Dellia Cloud, M.D.    Medications: I have reviewed the patient's current medications.    Marland Kitchen acetaminophen  1,000 mg Oral Q6H   Or  . acetaminophen (TYLENOL) oral liquid 160 mg/5 mL  975 mg Per Tube Q6H  . allopurinol  100 mg Oral Daily  . amiodarone (NEXTERONE) IV bolus only 150 mg/100 mL  150 mg Intravenous Once  . aspirin EC  325 mg Oral Daily   Or  . aspirin  324 mg Per Tube Daily  . bisacodyl  10 mg Oral Daily   Or  . bisacodyl  10 mg Rectal Daily  . colesevelam  1,875 mg Oral BID WC  . digoxin  0.25 mg Intravenous QID  . docusate sodium  200 mg Oral Daily  . ezetimibe  10 mg Oral Daily  . furosemide  40 mg Intravenous Once  . furosemide  80 mg Intravenous Once  . insulin aspart  0-24 Units Subcutaneous Q4H  . insulin glargine  30 Units Subcutaneous Q0700  . levalbuterol  0.63 mg Nebulization Q8H  . levothyroxine  100 mcg Oral QAC breakfast  . pantoprazole  40 mg Oral Q1200  . sodium chloride  3 mL Intravenous Q12H   Assessment/Plan: Patient Active Problem List  Diagnoses  . CAD, multiple vessel, severe disease, no stents placed only angioplasty to mid RCA.   Marland Kitchen Hypertension, uncontrolled on arrival 07/30/11  . Dyslipidemia  . Hypothyroidism  . Renal insufficiency  . Diabetes type 2, controlled  . History of gout  . Drug reaction  . Episodic lightheadedness, with near syncope X 3  . Racing heart beat, episodically  . Chest pain at rest, possible cardiac.  . S/P CABG x 3  PAF with RVR now resolved.  PLAN: BP labile, continues on  IV Dopamine 46mcg/kg/min, and Amiodarone 30mg /hr.. Also with Lanoxin load. To diuresis and mobilize. MD to see.  LOS: 10 days   Indya Oliveria R 08/09/2011, 11:01 AM

## 2011-08-10 ENCOUNTER — Encounter (HOSPITAL_COMMUNITY): Payer: Self-pay | Admitting: Cardiology

## 2011-08-10 ENCOUNTER — Inpatient Hospital Stay (HOSPITAL_COMMUNITY): Payer: Medicare Other

## 2011-08-10 DIAGNOSIS — I480 Paroxysmal atrial fibrillation: Secondary | ICD-10-CM | POA: Diagnosis not present

## 2011-08-10 DIAGNOSIS — D62 Acute posthemorrhagic anemia: Secondary | ICD-10-CM | POA: Diagnosis not present

## 2011-08-10 LAB — CBC
HCT: 26.1 % — ABNORMAL LOW (ref 36.0–46.0)
Hemoglobin: 8.8 g/dL — ABNORMAL LOW (ref 12.0–15.0)
MCH: 27.2 pg (ref 26.0–34.0)
MCHC: 33.7 g/dL (ref 30.0–36.0)
MCV: 80.6 fL (ref 78.0–100.0)
Platelets: 216 10*3/uL (ref 150–400)
RBC: 3.24 MIL/uL — ABNORMAL LOW (ref 3.87–5.11)
RDW: 16.2 % — ABNORMAL HIGH (ref 11.5–15.5)
WBC: 12.1 10*3/uL — ABNORMAL HIGH (ref 4.0–10.5)

## 2011-08-10 LAB — BASIC METABOLIC PANEL
BUN: 40 mg/dL — ABNORMAL HIGH (ref 6–23)
CO2: 25 mEq/L (ref 19–32)
Calcium: 9.5 mg/dL (ref 8.4–10.5)
Chloride: 102 mEq/L (ref 96–112)
Creatinine, Ser: 1.57 mg/dL — ABNORMAL HIGH (ref 0.50–1.10)
GFR calc Af Amer: 33 mL/min — ABNORMAL LOW (ref >90)
GFR calc non Af Amer: 28 mL/min — ABNORMAL LOW (ref >90)
Glucose, Bld: 98 mg/dL (ref 70–99)
Potassium: 3.8 mEq/L (ref 3.5–5.1)
Sodium: 139 mEq/L (ref 135–145)

## 2011-08-10 LAB — GLUCOSE, CAPILLARY: Glucose-Capillary: 126 mg/dL — ABNORMAL HIGH (ref 70–99)

## 2011-08-10 MED ORDER — FUROSEMIDE 10 MG/ML IJ SOLN
40.0000 mg | Freq: Once | INTRAMUSCULAR | Status: AC
  Administered 2011-08-10: 40 mg via INTRAVENOUS
  Filled 2011-08-10: qty 4

## 2011-08-10 MED ORDER — AMIODARONE HCL 200 MG PO TABS
200.0000 mg | ORAL_TABLET | Freq: Two times a day (BID) | ORAL | Status: DC
  Administered 2011-08-10 – 2011-08-11 (×4): 200 mg via ORAL
  Filled 2011-08-10 (×8): qty 1

## 2011-08-10 NOTE — Progress Notes (Signed)
Occupational Therapy Treatment Patient Details Name: [name] MRN: [id number] DOB: 1921-01-16 Today's Date: 08/10/2011  OT Assessment/Plan OT Assessment/Plan Comments on Treatment Session: Pt HR appeared to be stable today. Session limited by pt c/o "swimmy-headed" after toileting- BP checked and stable. Also, pt c/o Rt knee pain and felt like it was "turing in" OT Plan: Discharge plan remains appropriate OT Frequency: Min 1X/week Follow Up Recommendations: Skilled nursing facility Equipment Recommended: Defer to next venue OT Goals ADL Goals ADL Goal: Toilet Transfer - Progress: Progressing toward goals ADL Goal: Toileting - Hygiene - Progress: Progressing toward goals ADL Goal: Additional Goal #1 - Progress: Met  OT Treatment Precautions/Restrictions  Precautions Precautions: Sternal;Fall   ADL ADL Toilet Transfer: Performed;+2 Total assistance (pt=80%, second person needed for lines) Toilet Transfer Method: Proofreader: Set designer - Clothing Manipulation: Performed;Minimal assistance Where Assessed - Glass blower/designer Manipulation: Standing Toileting - Hygiene: Performed;Moderate assistance Toileting - Hygiene Details (indicate cue type and reason): pt with difficulty reaching to backside to clean- may benefit from toileting aide Where Assessed - Toileting Hygiene: Standing Equipment Used: Wheelchair (as a RW) Ambulation Related to ADLs: +2total(pt=80%) with w/c ambulation; easily fatigued and states that feels like Rt knee is "turning in" on her.  End of Session OT - End of Session Equipment Utilized During Treatment: Gait belt Activity Tolerance: Patient limited by fatigue;Patient limited by pain Patient left: in chair;with call bell in reach Nurse Communication: Mobility status for transfers;Mobility status for ambulation General Behavior During Session:  (more invovled today; speaking more with therapy) Cognition: WFL  for tasks performed  Shariah Assad  08/10/2011, 3:27 PM

## 2011-08-10 NOTE — Progress Notes (Signed)
PROCEDURE: CORONARY ARTERY BYPASS GRAFTING (CABG) x 4 (LIMA sequentially to mid and distal  LAD,SVG to distal Circumflex, and SVG to RCA) with EVH right thigh only   POD #5  Subjective: Much more energy today.  Up in chair, talking.  Took a few bites of breakfast.  Objective: Vital signs in last 24 hours: Temp:  [97.4 F (36.3 C)-98 F (36.7 C)] 98 F (36.7 C) (01/30 0731) Pulse Rate:  [80-133] 88  (01/30 0700) Resp:  [12-25] 21  (01/30 0700) BP: (82-136)/(37-92) 113/51 mmHg (01/30 0700) SpO2:  [95 %-98 %] 97 % (01/30 0700) Weight:  [95.5 kg (210 lb 8.6 oz)] 95.5 kg (210 lb 8.6 oz) (01/30 0600) Weight change: -2.2 kg (-4 lb 13.6 oz) Last BM Date: 08/04/11 Intake/Output from previous day: -2021 01/29 0701 - 01/30 0700 In: 739.5 [P.O.:180; I.V.:451.5; IV Piggyback:108] Out: 2720 [Urine:2720] Intake/Output this shift: Total I/O In: 5.2 [I.V.:5.2] Out: 150 [Urine:150]  PE: General: Alert, oriented X 3, talking today, yesterday too weak to talk  Heart:S1S2RRR- maintaining SR. Lungs:few crackles, scattered throughout lung fields. Abd:+BS, soft, non-tender. Ext:tr. Edema to 1+ Neuro:A&O X 3, MAE, follows commands   Lab Results:  Basename 08/10/11 0500 08/09/11 0430  WBC 12.1* 14.0*  HGB 8.8* 8.4*  HCT 26.1* 24.9*  PLT 216 143*   BMET  Basename 08/10/11 0500 08/09/11 0430  NA 139 138  K 3.8 3.7  CL 102 104  CO2 25 25  GLUCOSE 98 105*  BUN 40* 39*  CREATININE 1.57* 1.78*  CALCIUM 9.5 9.7   No results found for this basename: TROPONINI:2,CK,MB:2 in the last 72 hours  Lab Results  Component Value Date   CHOL 169 07/31/2011   HDL 68 07/31/2011   LDLCALC 88 07/31/2011   TRIG 64 07/31/2011   CHOLHDL 2.5 07/31/2011   Lab Results  Component Value Date   HGBA1C 5.9* 07/30/2011     Lab Results  Component Value Date   TSH 1.618 07/30/2011    Hepatic Function Panel No results found for this basename: PROT,ALBUMIN,AST,ALT,ALKPHOS,BILITOT,BILIDIR,IBILI in the last 72  hours No results found for this basename: CHOL in the last 72 hours No results found for this basename: PROTIME in the last 72 hours    EKG: Orders placed during the hospital encounter of 07/30/11  . EKG 12-LEAD  . EKG 12-LEAD  . EKG  . EKG 12-LEAD    Studies/Results: Dg Chest Port 1 View  08/10/2011  *RADIOLOGY REPORT*  Clinical Data: Follow-up cardiac surgery  PORTABLE CHEST - 1 VIEW  Comparison: 08/09/2011 and multiple priors  Findings: There are changes of median sternotomy for CABG.  Right subclavian central venous catheter terminates in the distal superior vena cava.  Stable cardiomegaly.  Central pulmonary vascular congestion is present.  There is left perihilar airspace disease.  Bibasilar atelectasis and small bilateral pleural effusions are without significant change.  IMPRESSION: No significant change in aeration of the lungs.  Left perihilar airspace disease likely reflects some residual edema, and there is bibasilar atelectasis with small bilateral pleural effusions.  Original Report Authenticated By: Britta Mccreedy, M.D.   Dg Chest Port 1 View  08/09/2011  *RADIOLOGY REPORT*  Clinical Data: Cardiac surgery  PORTABLE CHEST - 1 VIEW  Comparison: Chest radiograph 07/31/2011  Findings: Sternotomy wires overlie stable enlarged heart silhouette.  Right saphenous line is in place.  Bilateral pleural effusions not changed from prior.  There are low lung volumes. Central venous congestion is also unchanged.  No pneumothorax.  IMPRESSION:  1.  No interval change. 2.  Cardiomegaly, bilateral effusions, basilar atelectasis and central venous congestion.  Original Report Authenticated By: Genevive Bi, M.D.    Medications: I have reviewed the patient's current medications.    Marland Kitchen acetaminophen  1,000 mg Oral Q6H   Or  . acetaminophen (TYLENOL) oral liquid 160 mg/5 mL  975 mg Per Tube Q6H  . allopurinol  100 mg Oral Daily  . amiodarone (NEXTERONE) IV bolus only 150 mg/100 mL  150 mg  Intravenous Once  . amiodarone  200 mg Oral BID  . aspirin EC  325 mg Oral Daily   Or  . aspirin  324 mg Per Tube Daily  . bisacodyl  10 mg Oral Daily   Or  . bisacodyl  10 mg Rectal Daily  . colesevelam  1,875 mg Oral BID WC  . digoxin  0.25 mg Intravenous QID  . docusate sodium  200 mg Oral Daily  . enoxaparin  30 mg Subcutaneous Q24H  . ezetimibe  10 mg Oral Daily  . furosemide  40 mg Intravenous Once  . furosemide  80 mg Intravenous Once  . insulin aspart  0-24 Units Subcutaneous Q4H  . insulin glargine  30 Units Subcutaneous Q0700  . levalbuterol  0.63 mg Nebulization Q8H  . levothyroxine  100 mcg Oral QAC breakfast  . metoprolol tartrate  12.5 mg Oral BID  . pantoprazole  40 mg Oral Q1200  . sodium chloride  3 mL Intravenous Q12H   Assessment/Plan: Patient Active Problem List  Diagnoses  . CAD, multiple vessel, severe disease, no stents placed only angioplasty to mid RCA.   Marland Kitchen Hypertension, uncontrolled on arrival 07/30/11  . Dyslipidemia  . Hypothyroidism  . Renal insufficiency  . Diabetes type 2, controlled  . History of gout  . Drug reaction  . Episodic lightheadedness, with near syncope X 3  . Racing heart beat, episodically  . Chest pain at rest, possible cardiac.  . S/P CABG x 3,LIMA - sequential prox-mid LAD, SVG-OM, SVG- RPDA, 08/05/11  . Paroxysmal atrial fibrillation, post CABG  . Anemia associated with acute blood loss, related to CABG  PAFib, resolved.  Maintaining SR.  PLAN: Improving, slowly.  Appetite still poor -but improved from yesterday.   IV Dopamine now off. IV amiodarone -switching to po.   Anemia-H/H slowly drifting up. Volume overload- diuresing.   LOS: 11 days   INGOLD,LAURA R 08/10/2011, 9:50 AM  Pt seen & examined, chart reviewed.  I agree with Corliss Blacker note.  She looks considertbly better today -- now that she is in NSR.  Off pressors. Able to initiate diuresis.  Would not continue Digoxin unless Afib RVR returns. Gradually  add back BB as BP tolerates.  Would continue PO Amiodarone until post-discharge.  Ambulated today --will need PT/OT evaluation to initiate plans for d/c once able to transfer to floor.Marland Kitchen  Marykay Lex, M.D., M.S. [facility] [address] [place], [place]  [address]  [phone]  08/10/2011 10:47 PM

## 2011-08-10 NOTE — Progress Notes (Signed)
Patient discussed at the Long Length of Stay [name] Weeks 08/10/2011  

## 2011-08-10 NOTE — Progress Notes (Signed)
TCTS BRIEF SICU PROGRESS NOTE  5 Days Post-Op  S/P Procedure(s) (LRB): CORONARY ARTERY BYPASS GRAFTING (CABG) (N/A)   Stable day. Maintaining NSR  Plan: Continue current plan  OWEN,CLARENCE H 08/10/2011 9:21 PM

## 2011-08-10 NOTE — Progress Notes (Signed)
Patient ID: [name], female   DOB: [birth year], 78 y.o.   MRN: [id number] TCTS DAILY PROGRESS NOTE                   [address]            [place] [address]          [phone]      5 Days Post-Op Procedure(s) (LRB): CORONARY ARTERY BYPASS GRAFTING (CABG) (N/A)  Total Length of Stay:  LOS: 11 days   Subjective: More alert today walked in hall with help very slowly  Objective: Vital signs in last 24 hours: Temp:  [97.4 F (36.3 C)-98 F (36.7 C)] 97.8 F (36.6 C) (01/30 1531) Pulse Rate:  [77-118] 80  (01/30 1700) Cardiac Rhythm:  [-] Normal sinus rhythm (01/30 1600) Resp:  [12-25] 23  (01/30 1700) BP: (82-158)/(37-92) 112/65 mmHg (01/30 1700) SpO2:  [95 %-100 %] 99 % (01/30 1700) Weight:  [210 lb 8.6 oz (95.5 kg)] 210 lb 8.6 oz (95.5 kg) (01/30 0600)  Filed Weights   08/08/11 0500 08/09/11 0600 08/10/11 0600  Weight: 215 lb 6.2 oz (97.7 kg) 215 lb 6.2 oz (97.7 kg) 210 lb 8.6 oz (95.5 kg)    Weight change: -4 lb 13.6 oz (-2.2 kg)   Hemodynamic parameters for last 24 hours:    Intake/Output from previous day: 01/29 0701 - 01/30 0700 In: 739.5 [P.O.:180; I.V.:451.5; IV Piggyback:108] Out: 2720 [Urine:2720]  Intake/Output this shift: Total I/O In: 165.2 [I.V.:165.2] Out: 1005 [Urine:1005]  Current Meds: Scheduled Meds:   . acetaminophen  1,000 mg Oral Q6H   Or  . acetaminophen (TYLENOL) oral liquid 160 mg/5 mL  975 mg Per Tube Q6H  . allopurinol  100 mg Oral Daily  . amiodarone (NEXTERONE) IV bolus only 150 mg/100 mL  150 mg Intravenous Once  . amiodarone  200 mg Oral BID  . aspirin EC  325 mg Oral Daily   Or  . aspirin  324 mg Per Tube Daily  . bisacodyl  10 mg Oral Daily   Or  . bisacodyl  10 mg Rectal Daily  . colesevelam  1,875 mg Oral BID WC  . docusate sodium  200 mg Oral Daily  . enoxaparin  30 mg Subcutaneous Q24H  . ezetimibe  10 mg Oral Daily  . furosemide  40 mg Intravenous Once  . furosemide  80 mg Intravenous  Once  . insulin aspart  0-24 Units Subcutaneous Q4H  . insulin glargine  30 Units Subcutaneous Q0700  . levalbuterol  0.63 mg Nebulization Q8H  . levothyroxine  100 mcg Oral QAC breakfast  . metoprolol tartrate  12.5 mg Oral BID  . pantoprazole  40 mg Oral Q1200  . sodium chloride  3 mL Intravenous Q12H   Continuous Infusions:   . sodium chloride    . DOPamine Stopped (08/10/11 0800)  . lactated ringers 20 mL/hr at 08/07/11 0700  . phenylephrine (NEO-SYNEPHRINE) Adult infusion Stopped (08/08/11 1300)  . DISCONTD: amiodarone (NEXTERONE PREMIX) 360 mg/200 mL dextrose 30 mg/hr (08/10/11 0600)   PRN Meds:.ondansetron (ZOFRAN) IV, sodium chloride, traMADol  General appearance: fatigued and no distress Neurologic: intact Heart: regular rate and rhythm, S1, S2 normal, no murmur, click, rub or gallop Lungs: diminished breath sounds bibasilar  Lab Results: CBC: Basename 08/10/11 0500 08/09/11 0430  WBC 12.1* 14.0*  HGB 8.8* 8.4*  HCT 26.1* 24.9*  PLT 216 143*   BMET:  Basename 08/10/11 0500 08/09/11 0430  NA 139 138  K 3.8 3.7  CL 102 104  CO2 25 25  GLUCOSE 98 105*  BUN 40* 39*  CREATININE 1.57* 1.78*  CALCIUM 9.5 9.7    PT/INR: No results found for this basename: LABPROT,INR in the last 72 hours Radiology: Dg Chest Port 1 View  08/10/2011  *RADIOLOGY REPORT*  Clinical Data: Follow-up cardiac surgery  PORTABLE CHEST - 1 VIEW  Comparison: 08/09/2011 and multiple priors  Findings: There are changes of median sternotomy for CABG.  Right subclavian central venous catheter terminates in the distal superior vena cava.  Stable cardiomegaly.  Central pulmonary vascular congestion is present.  There is left perihilar airspace disease.  Bibasilar atelectasis and small bilateral pleural effusions are without significant change.  IMPRESSION: No significant change in aeration of the lungs.  Left perihilar airspace disease likely reflects some residual edema, and there is bibasilar atelectasis  with small bilateral pleural effusions.  Original Report Authenticated By: Britta Mccreedy, M.D.   Dg Chest Port 1 View  08/09/2011  *RADIOLOGY REPORT*  Clinical Data: Cardiac surgery  PORTABLE CHEST - 1 VIEW  Comparison: Chest radiograph 07/31/2011  Findings: Sternotomy wires overlie stable enlarged heart silhouette.  Right saphenous line is in place.  Bilateral pleural effusions not changed from prior.  There are low lung volumes. Central venous congestion is also unchanged.  No pneumothorax.  IMPRESSION:  1.  No interval change. 2.  Cardiomegaly, bilateral effusions, basilar atelectasis and central venous congestion.  Original Report Authenticated By: Genevive Bi, M.D.     Assessment/Plan: S/P Procedure(s) (LRB): CORONARY ARTERY BYPASS GRAFTING (CABG) (N/A) Mobilize Diuresis Continue foley due to diuresing patient Holding sinus today Renal function stable at preop baseline More alert and walking some with help Loaded with dig but no daily dose yet [name] MD  Beeper [phone] Office [phone] 08/10/2011 5:31 PM

## 2011-08-11 ENCOUNTER — Inpatient Hospital Stay (HOSPITAL_COMMUNITY): Payer: Medicare Other

## 2011-08-11 LAB — GLUCOSE, CAPILLARY
Glucose-Capillary: 110 mg/dL — ABNORMAL HIGH (ref 70–99)
Glucose-Capillary: 111 mg/dL — ABNORMAL HIGH (ref 70–99)
Glucose-Capillary: 133 mg/dL — ABNORMAL HIGH (ref 70–99)
Glucose-Capillary: 64 mg/dL — ABNORMAL LOW (ref 70–99)
Glucose-Capillary: 89 mg/dL (ref 70–99)

## 2011-08-11 LAB — CBC
HCT: 26.5 % — ABNORMAL LOW (ref 36.0–46.0)
Hemoglobin: 8.8 g/dL — ABNORMAL LOW (ref 12.0–15.0)
MCH: 26.6 pg (ref 26.0–34.0)
MCHC: 33.2 g/dL (ref 30.0–36.0)
MCV: 80.1 fL (ref 78.0–100.0)
Platelets: 217 10*3/uL (ref 150–400)
RBC: 3.31 MIL/uL — ABNORMAL LOW (ref 3.87–5.11)
RDW: 16 % — ABNORMAL HIGH (ref 11.5–15.5)
WBC: 12.9 10*3/uL — ABNORMAL HIGH (ref 4.0–10.5)

## 2011-08-11 LAB — BASIC METABOLIC PANEL
BUN: 38 mg/dL — ABNORMAL HIGH (ref 6–23)
CO2: 28 mEq/L (ref 19–32)
Calcium: 9.5 mg/dL (ref 8.4–10.5)
Chloride: 101 mEq/L (ref 96–112)
Creatinine, Ser: 1.56 mg/dL — ABNORMAL HIGH (ref 0.50–1.10)
GFR calc Af Amer: 33 mL/min — ABNORMAL LOW (ref >90)
GFR calc non Af Amer: 29 mL/min — ABNORMAL LOW (ref >90)
Glucose, Bld: 115 mg/dL — ABNORMAL HIGH (ref 70–99)
Potassium: 3.6 mEq/L (ref 3.5–5.1)
Sodium: 137 mEq/L (ref 135–145)

## 2011-08-11 MED ORDER — POTASSIUM CHLORIDE 10 MEQ/50ML IV SOLN
10.0000 meq | INTRAVENOUS | Status: DC
  Administered 2011-08-11 (×3): 10 meq via INTRAVENOUS
  Filled 2011-08-11: qty 50

## 2011-08-11 MED ORDER — ASPIRIN EC 81 MG PO TBEC
81.0000 mg | DELAYED_RELEASE_TABLET | Freq: Every day | ORAL | Status: DC
  Administered 2011-08-11 – 2011-08-15 (×5): 81 mg via ORAL
  Filled 2011-08-11 (×5): qty 1

## 2011-08-11 MED ORDER — BISACODYL 5 MG PO TBEC: 10.0000 mg | DELAYED_RELEASE_TABLET | Freq: Every day | ORAL | Status: DC | PRN

## 2011-08-11 MED ORDER — DEXTROSE 50 % IV SOLN
INTRAVENOUS | Status: AC
  Administered 2011-08-11: 25 mL
  Filled 2011-08-11: qty 50

## 2011-08-11 MED ORDER — PANTOPRAZOLE SODIUM 40 MG PO TBEC
40.0000 mg | DELAYED_RELEASE_TABLET | Freq: Every day | ORAL | Status: DC
  Administered 2011-08-11 – 2011-08-15 (×5): 40 mg via ORAL
  Filled 2011-08-11 (×5): qty 1

## 2011-08-11 MED ORDER — SODIUM CHLORIDE 0.9 % IV SOLN: 250.0000 mL | INTRAVENOUS | Status: DC | PRN

## 2011-08-11 MED ORDER — POTASSIUM CHLORIDE 10 MEQ/50ML IV SOLN
INTRAVENOUS | Status: AC
  Administered 2011-08-11: 10 meq via INTRAVENOUS
  Filled 2011-08-11: qty 50

## 2011-08-11 MED ORDER — INSULIN ASPART 100 UNIT/ML ~~LOC~~ SOLN
0.0000 [IU] | Freq: Three times a day (TID) | SUBCUTANEOUS | Status: DC
  Administered 2011-08-11 – 2011-08-14 (×8): 2 [IU] via SUBCUTANEOUS

## 2011-08-11 MED ORDER — PANTOPRAZOLE SODIUM 40 MG PO TBEC: 40.0000 mg | DELAYED_RELEASE_TABLET | Freq: Every day | ORAL | Status: DC

## 2011-08-11 MED ORDER — SODIUM CHLORIDE 0.9 % IJ SOLN
3.0000 mL | INTRAMUSCULAR | Status: DC | PRN
  Administered 2011-08-11: 3 mL via INTRAVENOUS

## 2011-08-11 MED ORDER — FUROSEMIDE 40 MG PO TABS
40.0000 mg | ORAL_TABLET | Freq: Every day | ORAL | Status: DC
  Administered 2011-08-11 – 2011-08-12 (×2): 40 mg via ORAL
  Filled 2011-08-11 (×3): qty 1

## 2011-08-11 MED ORDER — BISACODYL 10 MG RE SUPP: 10.0000 mg | Freq: Every day | RECTAL | Status: DC | PRN

## 2011-08-11 MED ORDER — DOCUSATE SODIUM 100 MG PO CAPS
200.0000 mg | ORAL_CAPSULE | Freq: Every day | ORAL | Status: DC
Start: 2011-08-11 — End: 2011-08-15
  Administered 2011-08-11 – 2011-08-14 (×4): 200 mg via ORAL
  Filled 2011-08-11 (×5): qty 2

## 2011-08-11 MED ORDER — SODIUM CHLORIDE 0.9 % IJ SOLN
3.0000 mL | Freq: Two times a day (BID) | INTRAMUSCULAR | Status: DC
  Administered 2011-08-11 – 2011-08-14 (×6): 3 mL via INTRAVENOUS

## 2011-08-11 MED ORDER — MOVING RIGHT ALONG BOOK
Freq: Once | Status: AC
  Administered 2011-08-11: 09:00:00
  Filled 2011-08-11: qty 1

## 2011-08-11 NOTE — Progress Notes (Signed)
UR Completed.  Rondey Fallen Jane [phone] 08/11/2011  

## 2011-08-11 NOTE — Progress Notes (Signed)
Patient ID: [name], female   DOB: 03/16/1921, 78 y.o.   MRN: [id number] TCTS DAILY PROGRESS NOTE                   [address]            [place] [id number]          [phone]      6 Days Post-Op Procedure(s) (LRB): CORONARY ARTERY BYPASS GRAFTING (CABG) (N/A)  Total Length of Stay:  LOS: 12 days   Subjective: Much more alert, wants change from ICU  Objective: Vital signs in last 24 hours: Temp:  [97.5 F (36.4 C)-98.2 F (36.8 C)] 98.1 F (36.7 C) (01/31 0725) Pulse Rate:  [77-102] 88  (01/31 0800) Cardiac Rhythm:  [-] Normal sinus rhythm (01/31 0800) Resp:  [14-23] 18  (01/31 0800) BP: (80-158)/(31-81) 131/43 mmHg (01/31 0800) SpO2:  [95 %-100 %] 97 % (01/31 0800) Weight:  [209 lb 14.1 oz (95.2 kg)] 209 lb 14.1 oz (95.2 kg) (01/31 0200)  Filed Weights   08/09/11 0600 08/10/11 0600 08/11/11 0200  Weight: 215 lb 6.2 oz (97.7 kg) 210 lb 8.6 oz (95.5 kg) 209 lb 14.1 oz (95.2 kg)    Weight change: -10.6 oz (-0.3 kg)   Hemodynamic parameters for last 24 hours:    Intake/Output from previous day: 01/30 0701 - 01/31 0700 In: 375.2 [I.V.:325.2; IV Piggyback:50] Out: 1740 [Urine:1740]  Intake/Output this shift: Total I/O In: 70 [I.V.:20; IV Piggyback:50] Out: 30 [Urine:30]  Current Meds: Scheduled Meds:   . acetaminophen  1,000 mg Oral Q6H   Or  . acetaminophen (TYLENOL) oral liquid 160 mg/5 mL  975 mg Per Tube Q6H  . allopurinol  100 mg Oral Daily  . amiodarone  200 mg Oral BID  . aspirin EC  325 mg Oral Daily   Or  . aspirin  324 mg Per Tube Daily  . bisacodyl  10 mg Oral Daily   Or  . bisacodyl  10 mg Rectal Daily  . colesevelam  1,875 mg Oral BID WC  . dextrose      . docusate sodium  200 mg Oral Daily  . enoxaparin  30 mg Subcutaneous Q24H  . ezetimibe  10 mg Oral Daily  . insulin aspart  0-24 Units Subcutaneous Q4H  . insulin glargine  30 Units Subcutaneous Q0700  . levalbuterol  0.63 mg Nebulization Q8H  .  levothyroxine  100 mcg Oral QAC breakfast  . metoprolol tartrate  12.5 mg Oral BID  . pantoprazole  40 mg Oral Q1200  . potassium chloride  10 mEq Intravenous Q1 Hr x 3  . sodium chloride  3 mL Intravenous Q12H   Continuous Infusions:   . sodium chloride    . DOPamine Stopped (08/10/11 0800)  . lactated ringers 20 mL/hr at 08/07/11 0700  . phenylephrine (NEO-SYNEPHRINE) Adult infusion Stopped (08/08/11 1300)   PRN Meds:.ondansetron (ZOFRAN) IV, sodium chloride, traMADol  General appearance: alert, cooperative and no distress Neurologic: intact Heart: regular rate and rhythm, S1, S2 normal, no murmur, click, rub or gallop Lungs: clear to auscultation bilaterally Abdomen: soft, non-tender; bowel sounds normal; no masses,  no organomegaly Extremities: extremities normal, atraumatic, no cyanosis or edema and Homans sign is negative, no sign of DVT Wound: stable sternum  Lab Results: CBC: Basename 08/11/11 0430 08/10/11 0500  WBC 12.9* 12.1*  HGB 8.8* 8.8*  HCT 26.5* 26.1*  PLT 217 216   BMET:  Basename 08/11/11 0430  08/10/11 0500  NA 137 139  K 3.6 3.8  CL 101 102  CO2 28 25  GLUCOSE 115* 98  BUN 38* 40*  CREATININE 1.56* 1.57*  CALCIUM 9.5 9.5    PT/INR: No results found for this basename: LABPROT,INR in the last 72 hours Radiology: Dg Chest Port 1 View  08/10/2011  *RADIOLOGY REPORT*  Clinical Data: Follow-up cardiac surgery  PORTABLE CHEST - 1 VIEW  Comparison: 08/09/2011 and multiple priors  Findings: There are changes of median sternotomy for CABG.  Right subclavian central venous catheter terminates in the distal superior vena cava.  Stable cardiomegaly.  Central pulmonary vascular congestion is present.  There is left perihilar airspace disease.  Bibasilar atelectasis and small bilateral pleural effusions are without significant change.  IMPRESSION: No significant change in aeration of the lungs.  Left perihilar airspace disease likely reflects some residual edema, and  there is bibasilar atelectasis with small bilateral pleural effusions.  Original Report Authenticated By: Britta Mccreedy, M.D.   Dg Chest Port 1v Same Day  08/11/2011  *RADIOLOGY REPORT*  Clinical Data: Follow-up postop open heart surgery.  PORTABLE CHEST - 1 VIEW SAME DAY  Comparison: 08/10/2011 and 08/09/2011.  Findings: 0700 hours.  Right subclavian central venous catheter appears unchanged.  There is stable cardiomegaly status post CABG. Left greater than right basilar air space opacities and probable small pleural effusions have not significantly changed.  There is no pneumothorax.  IMPRESSION: Stable postoperative chest.  Original Report Authenticated By: Gerrianne Scale, M.D.     Assessment/Plan: S/P Procedure(s) (LRB): CORONARY ARTERY BYPASS GRAFTING (CABG) (N/A) Mobilize Diuresis Diabetes control Plan for transfer to step-down: see transfer orders Holding sinus Renal function at baseline preop     [name] MD  Beeper [phone] Office [phone] 08/11/2011 8:38 AM

## 2011-08-11 NOTE — Progress Notes (Signed)
CBG: 69   Treatment: 15 GM carbohydrate snack  Symptoms: None  Follow-up CBG: Time:0100 CBG Result: 61  Possible Reasons for Event: Medication regimen: lantus?  Comments/MD notified:    Flossie Buffy

## 2011-08-11 NOTE — Progress Notes (Signed)
Physical Therapy Treatment Patient Details Name: [name] MRN: [id number] DOB: 01-11-1921 Today's Date: 08/11/2011  PT Assessment/Plan  PT - Assessment/Plan Comments on Treatment Session: Pt s/p CABG and post op Afib. Excellent progress today. Mental clarity and communication increased along with activity tolerance. Pt provided handout for sternal precautions and encouraged to amb again today with nursing. PT Plan: Discharge plan needs to be updated Follow Up Recommendations: Supervision/assist for mobility/OOB;Home health PT;Skilled nursing facility [facility] SNF pending family assist and progression) PT Goals  Acute Rehab PT Goals PT Goal: Sit to Stand - Progress: Progressing toward goal PT Goal: Stand to Sit - Progress: Progressing toward goal PT Transfer Goal: Bed to Chair/Chair to Bed - Progress: Progressing toward goal Pt will Ambulate: >150 feet;with supervision PT Goal: Ambulate - Progress: Updated due to goal met Additional Goals PT Goal: Additional Goal #1 - Progress: Progressing toward goal  PT Treatment Precautions/Restrictions  Precautions Precautions: Sternal;Fall Precaution Comments: watch HR closely, gets tacky with activity Restrictions Weight Bearing Restrictions: No Other Position/Activity Restrictions: Sternal precautions Mobility (including Balance) Bed Mobility Bed Mobility: No Transfers Sit to Stand: 3: Mod assist;From chair/3-in-1 Sit to Stand Details (indicate cue type and reason): cues for sequence, safety, and hand placement Stand to Sit: 4: Min assist;To chair/3-in-1 Stand to Sit Details: cues to sequence Ambulation/Gait Ambulation/Gait Assistance: 4: Min assist Ambulation/Gait Assistance Details (indicate cue type and reason): cues to step into RW Ambulation Distance (Feet): 120 Feet Assistive device: Rolling walker Gait Pattern: Decreased stride length;Trunk flexed Stairs: No  Posture/Postural Control Posture/Postural Control: Postural  limitations Exercise  General Exercises - Lower Extremity Long Arc Quad: AROM;Both;15 reps;Seated Hip Flexion/Marching: AAROM;Both;Other reps (comment);Seated (15reps) End of Session PT - End of Session Equipment Utilized During Treatment: Gait belt Activity Tolerance: Patient limited by fatigue Patient left: in chair;with call bell in reach Nurse Communication: Mobility status for transfers;Mobility status for ambulation General Behavior During Session: [facility] for tasks performed Cognition: [facility] for tasks performed Cognitive Impairment: Pt speaking and able to restate 2/3 sternal precautions end of session today  Delorse Lek 08/11/2011, 11:38 AM Toney Sang, PT [phone]

## 2011-08-11 NOTE — Progress Notes (Addendum)
[facility]  Subjective: Complained of a little intermittent, Sharp, left lateral CP.  "Not bad" .  Also some SOB.  Objective: Vital signs in last 24 hours: Temp:  [97.5 F (36.4 C)-98.2 F (36.8 C)] 98.1 F (36.7 C) (01/31 0725) Pulse Rate:  [77-102] 88  (01/31 0800) Resp:  [14-23] 18  (01/31 0800) BP: (80-143)/(31-81) 131/43 mmHg (01/31 0800) SpO2:  [95 %-100 %] 97 % (01/31 0800) Weight:  [95.2 kg (209 lb 14.1 oz)] 95.2 kg (209 lb 14.1 oz) (01/31 0200) Last BM Date: 08/10/11  Intake/Output from previous day: 01/30 0701 - 01/31 0700 In: 375.2 [I.V.:325.2; IV Piggyback:50] Out: 1740 [Urine:1740] Intake/Output this shift: Total I/O In: 70 [I.V.:20; IV Piggyback:50] Out: 30 [Urine:30]  Medications Current Facility-Administered Medications  Medication Dose Route Frequency Provider Last Rate Last Dose  . 0.9 %  sodium chloride infusion  250 mL Intravenous PRN [name], MD      . acetaminophen (TYLENOL) tablet 1,000 mg  1,000 mg Oral Q6H Ardelle Balls, PA   1,000 mg at 08/10/11 1200   Or  . acetaminophen (TYLENOL) solution 975 mg  975 mg Per Tube Q6H Ardelle Balls, PA   975 mg at 08/10/11 0631  . allopurinol (ZYLOPRIM) tablet 100 mg  100 mg Oral Daily Leone Brand, NP   100 mg at 08/11/11 1008  . amiodarone (PACERONE) tablet 200 mg  200 mg Oral BID [name], MD   200 mg at 08/11/11 1007  . aspirin EC tablet 81 mg  81 mg Oral Daily [name], MD   81 mg at 08/11/11 1007  . bisacodyl (DULCOLAX) EC tablet 10 mg  10 mg Oral Daily PRN [name], MD       Or  . bisacodyl (DULCOLAX) suppository 10 mg  10 mg Rectal Daily PRN [name], MD      . colesevelam [facility]) tablet 1,875 mg  1,875 mg Oral BID WC Leone Brand, NP   1,875 mg at 08/11/11 0814  . dextrose 50 % solution        25 mL at 08/11/11 0114  . docusate sodium (COLACE) capsule 200 mg  200 mg Oral Daily [name], MD   200 mg at  08/11/11 1007  . enoxaparin (LOVENOX) injection 30 mg  30 mg Subcutaneous Q24H [name], MD   30 mg at 08/10/11 1715  . ezetimibe (ZETIA) tablet 10 mg  10 mg Oral Daily Leone Brand, NP   10 mg at 08/11/11 1008  . furosemide (LASIX) tablet 40 mg  40 mg Oral Daily [name], MD   40 mg at 08/11/11 1007  . insulin aspart (novoLOG) injection 0-24 Units  0-24 Units Subcutaneous TID AC & HS [name], MD      . levothyroxine (SYNTHROID, LEVOTHROID) tablet 100 mcg  100 mcg Oral QAC breakfast Leone Brand, NP   100 mcg at 08/11/11 4098  . metoprolol tartrate (LOPRESSOR) tablet 12.5 mg  12.5 mg Oral BID [name], MD   12.5 mg at 08/11/11 1008  . moving right along book   Does not apply Once [name], MD      . pantoprazole (PROTONIX) EC tablet 40 mg  40 mg Oral QAC breakfast [name], MD   40 mg at 08/11/11 1007  . sodium chloride 0.9 % injection 3 mL  3 mL Intravenous Q12H Gwenith Daily  Tyrone Sage, MD   3 mL at 08/11/11 1008  . sodium chloride 0.9 % injection 3 mL  3 mL Intravenous PRN [name], MD   3 mL at 08/11/11 1008  . traMADol (ULTRAM) tablet 50 mg  50 mg Oral Q12H PRN [name], MD      . DISCONTD: 0.9 %  sodium chloride infusion   Intravenous Continuous Ardelle Balls, PA      . DISCONTD: aspirin chewable tablet 324 mg  324 mg Per Tube Daily Ardelle Balls, PA      . DISCONTD: aspirin EC tablet 325 mg  325 mg Oral Daily Ardelle Balls, PA   325 mg at 08/10/11 1000  . DISCONTD: bisacodyl (DULCOLAX) EC tablet 10 mg  10 mg Oral Daily Ardelle Balls, PA   10 mg at 08/10/11 1000  . DISCONTD: bisacodyl (DULCOLAX) suppository 10 mg  10 mg Rectal Daily Ardelle Balls, PA      . DISCONTD: docusate sodium (COLACE) capsule 200 mg  200 mg Oral Daily Ardelle Balls, PA   200 mg at 08/10/11 1000  . DISCONTD: DOPamine (INTROPIN) 800 mg in dextrose 5 % 250 mL infusion  1.5 mcg/kg/min Intravenous Continuous  [name], MD   1.5 mcg/kg/min at 08/10/11 0600  . DISCONTD: insulin aspart (novoLOG) injection 0-24 Units  0-24 Units Subcutaneous Q4H Alleen Borne, MD   2 Units at 08/10/11 2018  . DISCONTD: insulin glargine (LANTUS) injection 30 Units  30 Units Subcutaneous BH-q7a Alleen Borne, MD   30 Units at 08/10/11 0815  . DISCONTD: lactated ringers infusion   Intravenous Continuous Ardelle Balls, PA 20 mL/hr at 08/07/11 0700    . DISCONTD: levalbuterol (XOPENEX) nebulizer solution 0.63 mg  0.63 mg Nebulization Q8H [name], MD   0.63 mg at 08/11/11 0618  . DISCONTD: ondansetron (ZOFRAN) injection 4 mg  4 mg Intravenous Q6H PRN Ardelle Balls, PA   4 mg at 08/06/11 1345  . DISCONTD: pantoprazole (PROTONIX) EC tablet 40 mg  40 mg Oral Q1200 Ardelle Balls, PA   40 mg at 08/10/11 1200  . DISCONTD: pantoprazole (PROTONIX) EC tablet 40 mg  40 mg Oral QAC breakfast [name], MD      . DISCONTD: phenylephrine (NEO-SYNEPHRINE) 20,000 mcg in dextrose 5 % 250 mL infusion  0-100 mcg/min Intravenous Continuous Ardelle Balls, PA   5 mcg/min at 08/08/11 1200  . DISCONTD: potassium chloride 10 mEq in 50 mL *CENTRAL LINE* IVPB  10 mEq Intravenous Q1 Hr x 3 Purcell Nails, MD   10 mEq at 08/11/11 0854  . DISCONTD: sodium chloride 0.9 % injection 3 mL  3 mL Intravenous Q12H Ardelle Balls, PA   3 mL at 08/10/11 2236  . DISCONTD: sodium chloride 0.9 % injection 3 mL  3 mL Intravenous PRN Ardelle Balls, PA        PE: General appearance: alert, cooperative and no distress Lungs: Decreased BS on the left base to mid. Heart: regular rate and rhythm, S1, S2 normal, no murmur, click, rub or gallop Extremities: 1+ LEE Pulses: 2+ and symmetric  Lab Results:   Basename 08/11/11 0430 08/10/11 0500 08/09/11 0430  WBC 12.9* 12.1* 14.0*  HGB 8.8* 8.8* 8.4*  HCT 26.5* 26.1* 24.9*  PLT 217 216 143*   BMET  Basename 08/11/11 0430 08/10/11 0500 08/09/11 0430    NA 137 139 138  K 3.6 3.8 3.7  CL 101 102 104  CO2 28 25 25   GLUCOSE 115* 98 105*  BUN 38* 40* 39*  CREATININE 1.56* 1.57* 1.78*  CALCIUM 9.5 9.5 9.7   PORTABLE CHEST - 1 VIEW SAME DAY  Comparison: 08/10/2011 and 08/09/2011.  Findings: 0700 hours. Right subclavian central venous catheter  appears unchanged. There is stable cardiomegaly status post CABG.  Left greater than right basilar air space opacities and probable  small pleural effusions have not significantly changed. There is  no pneumothorax.  IMPRESSION:  Stable postoperative chest.  Assessment/Plan  Principal Problem:  *Chest pain at rest, possible cardiac. Active Problems:  CAD, multiple vessel, severe disease, no stents placed only angioplasty to mid RCA.   Dyslipidemia  Diabetes type 2, controlled  Episodic lightheadedness, with near syncope X 3  Racing heart beat, episodically  S/P CABG x 3,LIMA - sequential prox-mid LAD, SVG-OM, SVG- RPDA, 08/05/11  Paroxysmal atrial fibrillation, post CABG  Anemia associated with acute blood loss, related to CABG  Plan:  POD #6: CABG x 3,LIMA - sequential prox-mid LAD, SVG-OM, SVG- RPDA.  Cr, hgb, WBCs stable.  Diuresing well. Net out 3.3 liters in the last two days on 40mg  PO lasix.  BP and HR stable and with better control.  Encouraged use of incentive spirometer. Amio 200mg  twice daily. NSR.     LOS: 12 days    HAGER,BRYAN W 08/11/2011 10:20 AM   Agree with note written by Jones Skene PAC  S/P CABG, POD #6. Slow to progress. VSS. Labs OK. Renal function improviing. Exam benign. Transfer to 200 per TCTS  Runell Gess 08/11/2011 4:39 PM

## 2011-08-12 LAB — CBC
HCT: 25.7 % — ABNORMAL LOW (ref 36.0–46.0)
Hemoglobin: 8.5 g/dL — ABNORMAL LOW (ref 12.0–15.0)
MCH: 27.1 pg (ref 26.0–34.0)
MCHC: 33.1 g/dL (ref 30.0–36.0)
MCV: 81.8 fL (ref 78.0–100.0)
Platelets: 293 10*3/uL (ref 150–400)
RBC: 3.14 MIL/uL — ABNORMAL LOW (ref 3.87–5.11)
RDW: 16.4 % — ABNORMAL HIGH (ref 11.5–15.5)
WBC: 10.2 10*3/uL (ref 4.0–10.5)

## 2011-08-12 LAB — BASIC METABOLIC PANEL
BUN: 36 mg/dL — ABNORMAL HIGH (ref 6–23)
CO2: 26 mEq/L (ref 19–32)
Calcium: 9.6 mg/dL (ref 8.4–10.5)
Chloride: 104 mEq/L (ref 96–112)
Creatinine, Ser: 1.45 mg/dL — ABNORMAL HIGH (ref 0.50–1.10)
GFR calc Af Amer: 36 mL/min — ABNORMAL LOW (ref >90)
GFR calc non Af Amer: 31 mL/min — ABNORMAL LOW (ref >90)
Glucose, Bld: 84 mg/dL (ref 70–99)
Potassium: 4 mEq/L (ref 3.5–5.1)
Sodium: 139 mEq/L (ref 135–145)

## 2011-08-12 LAB — GLUCOSE, CAPILLARY
Glucose-Capillary: 91 mg/dL (ref 70–99)
Glucose-Capillary: 123 mg/dL — ABNORMAL HIGH (ref 70–99)
Glucose-Capillary: 129 mg/dL — ABNORMAL HIGH (ref 70–99)

## 2011-08-12 MED ORDER — FOLIC ACID 1 MG PO TABS
1.0000 mg | ORAL_TABLET | Freq: Every day | ORAL | Status: DC
  Administered 2011-08-12 – 2011-08-15 (×4): 1 mg via ORAL
  Filled 2011-08-12 (×4): qty 1

## 2011-08-12 MED ORDER — ENOXAPARIN SODIUM 150 MG/ML ~~LOC~~ SOLN
1.0000 mg/kg | Freq: Two times a day (BID) | SUBCUTANEOUS | Status: DC
  Filled 2011-08-12 (×2): qty 1

## 2011-08-12 MED ORDER — POLYSACCHARIDE IRON 150 MG PO CAPS
150.0000 mg | ORAL_CAPSULE | Freq: Every day | ORAL | Status: DC
  Administered 2011-08-12 – 2011-08-15 (×4): 150 mg via ORAL
  Filled 2011-08-12 (×4): qty 1

## 2011-08-12 MED ORDER — ENOXAPARIN SODIUM 150 MG/ML ~~LOC~~ SOLN
1.0000 mg/kg | SUBCUTANEOUS | Status: DC
  Filled 2011-08-12: qty 1

## 2011-08-12 MED ORDER — TRAMADOL HCL 50 MG PO TABS: 50.0000 mg | ORAL_TABLET | Freq: Two times a day (BID) | ORAL | Status: AC | PRN

## 2011-08-12 MED ORDER — POTASSIUM CHLORIDE CRYS ER 20 MEQ PO TBCR: 20.0000 meq | EXTENDED_RELEASE_TABLET | Freq: Every day | ORAL | Status: DC

## 2011-08-12 MED ORDER — METOPROLOL TARTRATE 1 MG/ML IV SOLN
2.5000 mg | Freq: Once | INTRAVENOUS | Status: DC
  Filled 2011-08-12: qty 5

## 2011-08-12 MED ORDER — FOLIC ACID 1 MG PO TABS: 1.0000 mg | ORAL_TABLET | Freq: Every day | ORAL | Status: DC

## 2011-08-12 MED ORDER — ENOXAPARIN SODIUM 30 MG/0.3ML ~~LOC~~ SOLN
30.0000 mg | SUBCUTANEOUS | Status: DC
  Administered 2011-08-12 – 2011-08-14 (×3): 30 mg via SUBCUTANEOUS
  Filled 2011-08-12 (×4): qty 0.3

## 2011-08-12 MED ORDER — AMIODARONE HCL 400 MG PO TABS: ORAL_TABLET | ORAL | Status: DC

## 2011-08-12 MED ORDER — METOPROLOL TARTRATE 25 MG PO TABS
25.0000 mg | ORAL_TABLET | Freq: Two times a day (BID) | ORAL | Status: DC
  Administered 2011-08-12: 25 mg via ORAL
  Filled 2011-08-12 (×4): qty 1

## 2011-08-12 MED ORDER — AMIODARONE HCL 200 MG PO TABS
400.0000 mg | ORAL_TABLET | Freq: Two times a day (BID) | ORAL | Status: DC
  Administered 2011-08-12 – 2011-08-13 (×4): 400 mg via ORAL
  Filled 2011-08-12 (×6): qty 2

## 2011-08-12 MED ORDER — POTASSIUM CHLORIDE CRYS ER 20 MEQ PO TBCR
20.0000 meq | EXTENDED_RELEASE_TABLET | Freq: Every day | ORAL | Status: DC
  Administered 2011-08-12: 20 meq via ORAL
  Filled 2011-08-12: qty 1
  Filled 2011-08-12: qty 2

## 2011-08-12 MED ORDER — POLYSACCHARIDE IRON 150 MG PO CAPS: 150.0000 mg | ORAL_CAPSULE | Freq: Every day | ORAL | Status: DC

## 2011-08-12 MED ORDER — METOPROLOL TARTRATE 12.5 MG HALF TABLET: 12.5000 mg | ORAL_TABLET | Freq: Two times a day (BID) | ORAL | Status: DC

## 2011-08-12 MED ORDER — FUROSEMIDE 40 MG PO TABS: 40.0000 mg | ORAL_TABLET | Freq: Every day | ORAL | Status: DC

## 2011-08-12 MED ORDER — ENOXAPARIN SODIUM 100 MG/ML ~~LOC~~ SOLN
1.0000 mg/kg | SUBCUTANEOUS | Status: DC
  Filled 2011-08-12: qty 1

## 2011-08-12 MED ORDER — METOPROLOL TARTRATE 1 MG/ML IV SOLN
INTRAVENOUS | Status: AC
  Filled 2011-08-12: qty 5

## 2011-08-12 MED ORDER — METOPROLOL TARTRATE 1 MG/ML IV SOLN
2.5000 mg | Freq: Once | INTRAVENOUS | Status: AC
  Administered 2011-08-12: 2.5 mg via INTRAVENOUS

## 2011-08-12 NOTE — Progress Notes (Signed)
CARDIAC REHAB PHASE I   PRE:  Rate/Rhythm: 99 afib  BP:  Supine:   Sitting: 131/51  Standing:    SaO2: 98% 2L  MODE:  Ambulation:  ft   POST:  Rate/Rhythem:   BP:  Supine:   Sitting:  Standing:    SaO2:   1345 - 1435 Pt out of chair to bedside commode with assist, heart rate up to 130s. RN aware to medicate for increased HR, not walk at this time.   Rosalie Doctor

## 2011-08-12 NOTE — Progress Notes (Signed)
Physical Therapy Treatment Patient Details Name: [name] MRN: [id number] DOB: 1920/08/24 Today's Date: 08/12/2011  PT Assessment/Plan  PT - Assessment/Plan Comments on Treatment Session: Pt s/p CABG with AFib. Pt continues to have increased HR at the end of ambulation. Pt maintained 120 throughout amb until return to room when rate spiked to 138, RN and NP aware. Pt reports mild chest pain with activity but not consistent with increased HR. Pt able to recall 2/3 sternal precautions and educated for all. Will continue to follow. PT Plan: Discharge plan remains appropriate PT Goals  Acute Rehab PT Goals PT Goal: Sit to Stand - Progress: Progressing toward goal PT Goal: Stand to Sit - Progress: Progressing toward goal PT Transfer Goal: Bed to Chair/Chair to Bed - Progress: Progressing toward goal PT Goal: Ambulate - Progress: Progressing toward goal Additional Goals PT Goal: Additional Goal #1 - Progress: Progressing toward goal  PT Treatment Precautions/Restrictions  Precautions Precautions: Sternal;Fall Precaution Comments: watch HR closely, gets tacky with activity Restrictions Weight Bearing Restrictions: No Other Position/Activity Restrictions: Sternal precautions Mobility (including Balance) Bed Mobility Bed Mobility: No Transfers Sit to Stand: 3: Mod assist;From chair/3-in-1;5: Supervision Sit to Stand Details (indicate cue type and reason): mod assist from recliner, supervision with cues for hand placement from 3in1 Stand to Sit: 4: Min assist;To chair/3-in-1 Stand to Sit Details: cues for safety, sequence and hand placement Ambulation/Gait Ambulation/Gait: Yes Ambulation/Gait Assistance: 4: Min assist Ambulation/Gait Assistance Details (indicate cue type and reason): minguard with vc to step into RW, 4 standing rests approximately 20 sec each Ambulation Distance (Feet): 150 Feet Assistive device: Rolling walker Gait Pattern: Decreased stride length;Trunk  flexed Stairs: No  Posture/Postural Control Posture/Postural Control: Postural limitations Exercise  General Exercises - Lower Extremity Long Arc Quad: AROM;Both;15 reps;Seated Hip Flexion/Marching: AAROM;Both;Other reps (comment);Seated (15reps) End of Session PT - End of Session Equipment Utilized During Treatment: Gait belt Activity Tolerance: Treatment limited secondary to medical complications (Comment) Patient left: in chair;with call bell in reach Nurse Communication: Mobility status for transfers;Mobility status for ambulation General Behavior During Session: [facility] for tasks performed Cognition: [facility] for tasks performed  Delorse Lek 08/12/2011, 9:17 AM Toney Sang, PT [phone]

## 2011-08-12 NOTE — Discharge Summary (Signed)
[address]            [name] [address]          [id number]         Discharge Summary  Name: [name] DOB: Jan 22, 1921 78 y.o. MRN: [id number]  Admission Date: 07/30/2011 Discharge Date:    Admitting Diagnosis: Chest pain  Discharge Diagnosis:  Coronary artery disease Unstable angina Dyslipidemia Diabetes mellitus Postoperative atrial fibrillation Acute blood loss anemia  History of inferior myocardial infarction in November 2012 Postoperative acute on chronic renal insufficiency Hypertension Hypothyroidism  Procedures: Procedure(s): CORONARY ARTERY BYPASS GRAFTING x 4 (sequential left internal mammary artery to the mid LAD and distal LAD, saphenous vein graft to the circumflex, saphenous vein graft to the posterior descending), EVH bilateral thighs on 08/05/2011   HPI:  The patient is a 78 y.o. female who initially presented in November 2012 with an acute inferior wall myocardial infarction. Cardiac catheterization was performed by Dr. Herbie Baltimore and notable for ostial left main stenosis and 100% occlusion of the mid right coronary artery. Telephone consultation with cardiothoracic surgery was performed and a decision was made to proceed with primary balloon angioplasty of the culprit right coronary artery. This was performed successfully and the patient has remained clinically stable after. Dr Cornelius Moras discussed CABG with patient and family. She ultimately did not undergo treatment for left main disease and was discharged home on Plavix. She presented to the emergency department on the date of this admission with recurrent chest pain with associated lightheadedness. She was admitted for further evaluation.   Hospital Course:  The patient was admitted to [facility] on 07/30/2011. She ruled out for an acute myocardial infarction. A cardiac surgery consult was once again requested and the patient was seen by Dr. Tyrone Sage. It was felt that  she was a poor candidate for stenting and at high risk for complications with Rotablator procedure. Although she is a high risk for complications related to surgery, it was felt that this was her best course of action. All risks, benefits and alternatives of surgery were explained in detail, and the patient agreed to proceed.   The patient was taken to the operating room and underwent the above procedure.    The postoperative course was notable for atrial fibrillation. She was started on a beta blocker and was treated with IV and subsequently by mouth amiodarone. She currently is maintaining sinus rhythm. She has had an acute blood loss anemia requiring transfusion. She currently is on iron supplement and her hemoglobin and hematocrit are stable. She had a mild renal insufficiency postoperatively with a peak creatinine of 1.78. This subsequently trended back to baseline. She has been weak and deconditioned and required a slightly prolonged stay in the ICU. She has been working with PT and OT and is making good progress with mobility. She's tolerating a regular diet. Her blood sugars have remained stable. She has been weaned from supplemental oxygen and her other vital signs have been stable. She overall is progressing well and we anticipate discharge home within the next 48 hours.   Recent vital signs:  Filed Vitals:   08/12/11 0933  BP: 121/66  Pulse:   Temp:   Resp:     Recent laboratory studies:  CBC: Basename 08/12/11 0452 08/11/11 0430  WBC 10.2 12.9*  HGB 8.5* 8.8*  HCT 25.7* 26.5*  PLT 293 217  BMET:  Basename 08/12/11 0452 08/11/11 0430  NA 139 137  K 4.0 3.6  CL 104 101  CO2 26 28  GLUCOSE 84 115*  BUN 36* 38*  CREATININE 1.45* 1.56*  CALCIUM 9.6 9.5    PT/INR: No results found for this basename: LABPROT,INR in the last 72 hours  Discharge Medications:   Medication List  As of 08/12/2011 10:05 AM   STOP taking these medications         amLODipine 10 MG tablet       clopidogrel 75 MG tablet      nitroGLYCERIN 0.4 MG SL tablet         TAKE these medications         acetaminophen 325 MG tablet   Commonly known as: TYLENOL   Take 650 mg by mouth 2 (two) times daily.      allopurinol 100 MG tablet   Commonly known as: ZYLOPRIM   Take 100 mg by mouth daily.      amiodarone 400 MG tablet   Commonly known as: PACERONE   Take 2 tabs (400 mg) po bid x 1 week, then decrease to 1 tab (200 mg) bid      aspirin 81 MG tablet   Take 81 mg by mouth daily.      colesevelam 625 MG tablet   Commonly known as: WELCHOL   Take 1,875 mg by mouth 2 (two) times daily with a meal.      ezetimibe 10 MG tablet   Commonly known as: ZETIA   Take 10 mg by mouth daily.      folic acid 1 MG tablet   Commonly known as: FOLVITE   Take 1 tablet (1 mg total) by mouth daily.      furosemide 40 MG tablet   Commonly known as: LASIX   Take 1 tablet (40 mg total) by mouth daily. X 1 week      glimepiride 2 MG tablet   Commonly known as: AMARYL   Take 2 mg by mouth daily before breakfast.      levothyroxine 100 MCG tablet   Commonly known as: SYNTHROID, LEVOTHROID   Take 100 mcg by mouth daily.      LORazepam 0.5 MG tablet   Commonly known as: ATIVAN   Take 0.5 mg by mouth every 8 (eight) hours.      metoprolol tartrate 12.5 mg Tabs   Commonly known as: LOPRESSOR   Take 0.5 tablets (12.5 mg total) by mouth 2 (two) times daily.      pantoprazole 40 MG tablet   Commonly known as: PROTONIX   Take 1 tablet (40 mg total) by mouth daily at 12 noon.      polysaccharide iron 150 MG Caps capsule   Commonly known as: NIFEREX   Take 1 capsule (150 mg total) by mouth daily.      potassium chloride SA 20 MEQ tablet   Commonly known as: K-DUR,KLOR-CON   Take 1 tablet (20 mEq total) by mouth daily. X 1 week      sodium bicarbonate 650 MG tablet   Take 650 mg by mouth daily.      traMADol 50 MG tablet   Commonly known as: ULTRAM   Take 1 tablet (50 mg total) by  mouth every 12 (twelve) hours as needed for pain.            Discharge Instructions:  The patient is to refrain from driving, heavy lifting or strenuous activity.  May shower daily and clean incisions with soap and water.  May resume regular diet.  Discharge Orders    Future Appointments: Provider: Department: Dept Phone: Center:   09/08/2011 12:00 PM [name], MD [facility] [phone] [facility]      Follow-up Information    Follow up with GERHARDT,EDWARD B, MD on 09/08/2011. (Have a chest x-ray at 11:00, see MD at 12:00)    Contact information:   [address] [place] [address] [phone]       Follow up with Marykay Lex, MD. Schedule an appointment as soon as possible for a visit in 2 weeks.   Contact information:   [facility] [address], [address] [place] [address] [phone]           Bettye Sitton H 08/12/2011, 10:05 AM

## 2011-08-12 NOTE — Progress Notes (Addendum)
Subjective: No chest pain. After walk pt went into afib with RVR HR up to 130, slowly coming down.  Objective: Vital signs in last 24 hours: Temp:  [97.4 F (36.3 C)-98.4 F (36.9 C)] 97.4 F (36.3 C) (02/01 0626) Pulse Rate:  [86-100] 90  (02/01 0626) Resp:  [16-23] 18  (02/01 0626) BP: (102-156)/(28-88) 127/67 mmHg (02/01 0626) SpO2:  [89 %-100 %] 98 % (02/01 0626) Weight:  [94.575 kg (208 lb 8 oz)] 94.575 kg (208 lb 8 oz) (02/01 0626) Weight change: -0.625 kg (-1 lb 6.1 oz) Last BM Date: 08/11/11 Intake/Output from previous day: -1349  WT: 94.5 OVERALL SLOWLY DECREASING 01/31 0701 - 02/01 0700 In: 720 [P.O.:600; I.V.:20; IV Piggyback:100] Out: 270 [Urine:270] Intake/Output this shift:    PE: General:A&O X 3 Pleasant affect, some SOB with a fib with RVR Heart:S1S2, irreg, irreg Lungs:clear no wheezes Abd:+ BS, soft non tender Ext:1 + lower ext. edema     Lab Results:  Basename 08/12/11 0452 08/11/11 0430  WBC 10.2 12.9*  HGB 8.5* 8.8*  HCT 25.7* 26.5*  PLT 293 217   BMET  Basename 08/12/11 0452 08/11/11 0430  NA 139 137  K 4.0 3.6  CL 104 101  CO2 26 28  GLUCOSE 84 115*  BUN 36* 38*  CREATININE 1.45* 1.56*  CALCIUM 9.6 9.5   No results found for this basename: TROPONINI:2,CK,MB:2 in the last 72 hours  Lab Results  Component Value Date   CHOL 169 07/31/2011   HDL 68 07/31/2011   LDLCALC 88 07/31/2011   TRIG 64 07/31/2011   CHOLHDL 2.5 07/31/2011   Lab Results  Component Value Date   HGBA1C 5.9* 07/30/2011     Lab Results  Component Value Date   TSH 1.618 07/30/2011    Hepatic Function Panel No results found for this basename: PROT,ALBUMIN,AST,ALT,ALKPHOS,BILITOT,BILIDIR,IBILI in the last 72 hours No results found for this basename: CHOL in the last 72 hours No results found for this basename: PROTIME in the last 72 hours    EKG: Orders placed during the hospital encounter of 07/30/11  . EKG 12-LEAD  . EKG 12-LEAD  . EKG  . EKG 12-LEAD      Studies/Results: Dg Chest Port 1v Same Day  08/11/2011  *RADIOLOGY REPORT*  Clinical Data: Follow-up postop open heart surgery.  PORTABLE CHEST - 1 VIEW SAME DAY  Comparison: 08/10/2011 and 08/09/2011.  Findings: 0700 hours.  Right subclavian central venous catheter appears unchanged.  There is stable cardiomegaly status post CABG. Left greater than right basilar air space opacities and probable small pleural effusions have not significantly changed.  There is no pneumothorax.  IMPRESSION: Stable postoperative chest.  Original Report Authenticated By: Gerrianne Scale, M.D.    Medications: I have reviewed the patient's current medications.       Marland Kitchen allopurinol  100 mg Oral Daily  . amiodarone  200 mg Oral BID  . aspirin EC  81 mg Oral Daily  . colesevelam  1,875 mg Oral BID WC  . docusate sodium  200 mg Oral Daily  . enoxaparin  30 mg Subcutaneous Q24H  . ezetimibe  10 mg Oral Daily  . folic acid  1 mg Oral Daily  . furosemide  40 mg Oral Daily  . insulin aspart  0-24 Units Subcutaneous TID AC & HS  . levothyroxine  100 mcg Oral QAC breakfast  . metoprolol tartrate  12.5 mg Oral BID  . moving right along book   Does not apply Once  .  pantoprazole  40 mg Oral QAC breakfast  . polysaccharide iron  150 mg Oral Daily  . sodium chloride  3 mL Intravenous Q12H  . DISCONTD: aspirin  324 mg Per Tube Daily  . DISCONTD: aspirin EC  325 mg Oral Daily  . DISCONTD: bisacodyl  10 mg Oral Daily  . DISCONTD: bisacodyl  10 mg Rectal Daily  . DISCONTD: docusate sodium  200 mg Oral Daily  . DISCONTD: insulin aspart  0-24 Units Subcutaneous Q4H  . DISCONTD: insulin glargine  30 Units Subcutaneous BH-q7a  . DISCONTD: levalbuterol  0.63 mg Nebulization Q8H  . DISCONTD: pantoprazole  40 mg Oral Q1200  . DISCONTD: pantoprazole  40 mg Oral QAC breakfast  . DISCONTD: potassium chloride  10 mEq Intravenous Q1 Hr x 3  . DISCONTD: sodium chloride  3 mL Intravenous Q12H    Assessment/Plan: Patient Active  Problem List  Diagnoses  . CAD, multiple vessel, severe disease, no stents placed only angioplasty to mid RCA.   Marland Kitchen Hypertension, uncontrolled on arrival 07/30/11  . Dyslipidemia  . Hypothyroidism  . Renal insufficiency  . Diabetes type 2, controlled  . History of gout  . Drug reaction  . Episodic lightheadedness, with near syncope X 3  . Racing heart beat, episodically  . Chest pain at rest, possible cardiac.  . S/P CABG x 3,LIMA - sequential prox-mid LAD, SVG-OM, SVG- RPDA, 08/05/11  . Paroxysmal atrial fibrillation, post CABG  . Anemia associated with acute blood loss, related to CABG  POST OP DAY #7  Plan: ON AMIODARONE 200 bid for post op a fib, was maintaining SR.  Now after walk she is back in atrial fib. Will give 2.5 mg IV lopressor, increase Amiodarone to 400 BID.  LOS: 13 days   INGOLD,LAURA R 08/12/2011, 8:06 AM Agree with note written by Nada Boozer RNP  POD # 7 CABG. Slow to progress. PAF with variable VT. On PO BB and amio. Suspect she should also be on iv hep if OK with TCTS. Ambulate with [facility]. Home per TCTS.  Runell Gess 08/12/2011 4:40 PM

## 2011-08-12 NOTE — Progress Notes (Signed)
CSW spoke with pt daughter, who confirms she has arranged time of work in order to provide 24 hour care to pt at time of d/c. Pt daughter requesting home health RN/PT/OT with [facility]. CSW referred to [facility], [place] Ace and signing off at this time.  Baxter Flattery, MSW [phone]

## 2011-08-12 NOTE — Progress Notes (Addendum)
7 Days Post-Op Procedure(s) (LRB): CORONARY ARTERY BYPASS GRAFTING (CABG) (N/A)  Subjective: Patient slept well and has no real complaints this am.  Objective: Vital signs in last 24 hours: Patient Vitals for the past 24 hrs:  BP Temp Temp src Pulse Resp SpO2 Weight  08/12/11 0626 127/67 mmHg 97.4 F (36.3 C) Oral 90  18  98 % 208 lb 8 oz (94.575 kg)  08/11/11 2102 139/64 mmHg 97.4 F (36.3 C) Oral 89  18  100 % -  08/11/11 1900 132/38 mmHg - - 95  16  99 % -  08/11/11 1851 - 98.4 F (36.9 C) Oral - - - -  08/11/11 1800 115/28 mmHg - - 98  19  100 % -  08/11/11 1700 103/86 mmHg - - 93  19  98 % -  08/11/11 1600 117/65 mmHg - - 86  18  99 % -   Pre op weight  91.6 kg Current Weight  08/12/11 208 lb 8 oz (94.575 kg)      Intake/Output from previous day: 01/31 0701 - 02/01 0700 In: 720 [P.O.:600; I.V.:20; IV Piggyback:100] Out: 270 [Urine:270]   Physical Exam:  Cardiovascular: RRR, no murmurs, gallops, or rubs. Pulmonary: Slightly decreased at bases; no rales, wheezes, or rhonchi. Abdomen: Soft, non tender, bowel sounds present. Extremities: Bilateral lower extremity edema. Wounds: Sternal wound is clean and dry.  No erythema or signs of infection.RLE wounds with serosanginous oozing but no signs of infection.  Lab Results: CBC: Basename 08/12/11 0452 08/11/11 0430  WBC 10.2 12.9*  HGB 8.5* 8.8*  HCT 25.7* 26.5*  PLT 293 217   BMET:  Basename 08/12/11 0452 08/11/11 0430  NA 139 137  K 4.0 3.6  CL 104 101  CO2 26 28  GLUCOSE 84 115*  BUN 36* 38*  CREATININE 1.45* 1.56*  CALCIUM 9.6 9.5    PT/INR: No results found for this basename: LABPROT,INR in the last 72 hours ABG:  INR: Will add last result for INR, ABG once components are confirmed Will add last 4 CBG results once components are confirmed  Assessment/Plan:  1. CV - Brief run of aflutter about 12:58 am but converted and has stayed in SR since then. Continue Amiodarone 200 bid. Continue Lopressor  12.5 bid. Will titrate as blood pressure allows. 2.  Pulmonary - Encourage incentive spirometer.Wean off O2 as tolerates. 3. Volume Overload - Continue diuresis. 4.  Acute blood loss anemia - H/H slightly decreased this am to 8.5/25.7. Start Nu Iron. 5.Mild renal insufficiency-Cr decreased to 1.45 this am. 6.DM-CBGs 111/133/91.Will restart Amaryl soon and discontinue any scheduled insulin. Pre op HGA1C was 5.9.   [name],[name] MPA-C 08/12/2011   with risk of bleed and renal insufficieny will avoid high dose lovenx Now in sinus  [name] MD  Beeper [phone] Office [phone] 08/12/2011 9:47 PM

## 2011-08-12 NOTE — Progress Notes (Signed)
   CARE MANAGEMENT NOTE 08/12/2011  Patient:  [name], [name]   Account Number:  [id number]  Date Initiated:  08/08/2011  Documentation initiated by:  [facility]  Subjective/Objective Assessment:   post op CABG x4 on 08-05-11 after STEMI.  Has Children     Action/Plan:   PTA, PT INDEPENDENT, LIVES WITH SPOUSE.   Anticipated DC Date:  08/15/2011   Anticipated DC Plan:  HOME W HOME HEALTH SERVICES  In-house referral  Clinical Social Worker      DC Associate Professor  CM consult      [facility] Choice  HOME HEALTH   Choice offered to / List presented to:  C-1 Patient        HH arranged  HH-1 RN  HH-2 PT  HH-3 OT  HH-4 NURSE'S AIDE      HH agency  [facility]   Status of service:  In process, will continue to follow Medicare Important Message given?   (If response is "NO", the following Medicare IM given date fields will be blank) Date Medicare IM given:   Date Additional Medicare IM given:    Discharge Disposition:  HOME W HOME HEALTH SERVICES  Per UR Regulation:  Reviewed for med. necessity/level of care/duration of stay  Comments:  08/12/11 Jaden Batchelder,RN,BSN [id number] CONFIRMED THAT PT'S DAUGHTER, [name] [phone]) WILL PROVIDE 24HR CARE AT DISCHARGE.  PT HAS PROGRESSED WELL, AND IS NOW SAFE TO GO HOME WITH HH.  WILL ARRANGE SERVICES WITH [facility], PER CHOICE. START OF CARE 24-48H POST DC DATE. MAY NEED HOME O2 IF UNABLE TO WEAN, BUT NO ORDERS PRESENTLY.  WILL FOLLOW.   08-10-11 10:40am Avie Arenas, RNBSN [phone] UR Completed.  Now off pressors through night - post op fib - changing IV amio to po.  08/08/11 Kemani Demarais,RN,BSN 1430 PT SLOW TO PROGRESS S/P CAGB X 4.  PT AND OT RECOMMENDING SNF FOR REHAB AT DISCHARGE.  WILL CONSULT CSW TO FACILITATE DC TO SNF WHEN MEDICALLY STABLE.  08-08-11 2pm Avie Arenas, RNBSN [phone] UR completed.

## 2011-08-12 NOTE — Progress Notes (Signed)
ANTICOAGULATION CONSULT NOTE - Initial Consult  Pharmacy Consult for Lovenox Indication: atrial fibrillation  Allergies  Allergen Reactions  . Statins Rash    Patient Measurements: Height: 5\' 1"  (154.9 cm) Weight: 208 lb 8 oz (94.575 kg) IBW/kg (Calculated) : 47.8    Vital Signs: Temp: 97.4 F (36.3 C) (02/01 0626) Temp src: Oral (02/01 0626) BP: 131/51 mmHg (02/01 1428) Pulse Rate: 107  (02/01 1428)  Labs:  Basename 08/12/11 0452 08/11/11 0430 08/10/11 0500  HGB 8.5* 8.8* --  HCT 25.7* 26.5* 26.1*  PLT 293 217 216  APTT -- -- --  LABPROT -- -- --  INR -- -- --  HEPARINUNFRC -- -- --  CREATININE 1.45* 1.56* 1.57*  CKTOTAL -- -- --  CKMB -- -- --  TROPONINI -- -- --   Estimated Creatinine Clearance: 28.2 ml/min (by C-G formula based on Cr of 1.45).  Medical History: Past Medical History  Diagnosis Date  . Diabetes mellitus   . Hypertension   . PONV (postoperative nausea and vomiting)   . Angina   . Shortness of breath   . Chronic kidney disease   . Hypothyroidism   . Headache   . Arthritis   . Anxiety   . Myocardial infarction   . Pneumonia   . Episodic lightheadedness, with near syncope X 3 07/30/2011  . Coronary artery disease   . 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 Paroxysmal atrial fibrillation, post CABG 08/10/2011  . Anemia associated with acute blood loss, related to CABG 08/10/2011    Medications:  Scheduled:    . allopurinol  100 mg Oral Daily  . amiodarone  400 mg Oral BID  . aspirin EC  81 mg Oral Daily  . colesevelam  1,875 mg Oral BID WC  . docusate sodium  200 mg Oral Daily  . enoxaparin  1 mg/kg Subcutaneous Q24H  . ezetimibe  10 mg Oral Daily  . folic acid  1 mg Oral Daily  . furosemide  40 mg Oral Daily  . insulin aspart  0-24 Units Subcutaneous TID AC & HS  . levothyroxine  100 mcg Oral QAC breakfast  . metoprolol      . metoprolol  2.5 mg Intravenous Once  . metoprolol  2.5 mg  Intravenous Once  . metoprolol tartrate  12.5 mg Oral BID  . pantoprazole  40 mg Oral QAC breakfast  . polysaccharide iron  150 mg Oral Daily  . potassium chloride  20 mEq Oral Daily  . sodium chloride  3 mL Intravenous Q12H  . DISCONTD: amiodarone  200 mg Oral BID  . DISCONTD: enoxaparin  30 mg Subcutaneous Q24H  . DISCONTD: enoxaparin  1 mg/kg Subcutaneous Q12H    Assessment: 78 year old woman s/p CABG on 1/25 after STEMI to start on therapeutic Lovenox for afib if OK with TCTS.  Estimated creatinine clearance is 28, so q24 dosing is appropriate. Goal of Therapy:  Therapeutic anticoagulation   Plan:  Lovenox 1mg /kg daily (95mg  sq daily) Will check cbc every three days while on Lovenox.  Mickeal Skinner 08/12/2011,4:56 PM

## 2011-08-13 LAB — BASIC METABOLIC PANEL
CO2: 24 mEq/L (ref 19–32)
Calcium: 10 mg/dL (ref 8.4–10.5)
Chloride: 104 mEq/L (ref 96–112)
GFR calc Af Amer: 33 mL/min — ABNORMAL LOW (ref >90)
Sodium: 137 mEq/L (ref 135–145)

## 2011-08-13 LAB — GLUCOSE, CAPILLARY
Glucose-Capillary: 101 mg/dL — ABNORMAL HIGH (ref 70–99)
Glucose-Capillary: 124 mg/dL — ABNORMAL HIGH (ref 70–99)
Glucose-Capillary: 124 mg/dL — ABNORMAL HIGH (ref 70–99)

## 2011-08-13 MED ORDER — METOPROLOL TARTRATE 25 MG PO TABS
25.0000 mg | ORAL_TABLET | Freq: Three times a day (TID) | ORAL | Status: DC
  Administered 2011-08-13 – 2011-08-15 (×7): 25 mg via ORAL
  Filled 2011-08-13 (×9): qty 1

## 2011-08-13 MED ORDER — ROSUVASTATIN CALCIUM 5 MG PO TABS
5.0000 mg | ORAL_TABLET | Freq: Every day | ORAL | Status: DC
  Administered 2011-08-13 – 2011-08-14 (×2): 5 mg via ORAL
  Filled 2011-08-13 (×3): qty 1

## 2011-08-13 MED ORDER — FUROSEMIDE 40 MG PO TABS
40.0000 mg | ORAL_TABLET | Freq: Every day | ORAL | Status: DC
  Filled 2011-08-13: qty 1

## 2011-08-13 NOTE — Progress Notes (Signed)
[facility] Progress Note  Subjective:  Feels better;  Converted to NSR at 21:29 last night.   Objective:   Vital Signs in the last 24 hours: Temp:  [97.4 F (36.3 C)-98.4 F (36.9 C)] 97.4 F (36.3 C) (02/02 0444) Pulse Rate:  [83-138] 83  (02/02 0444) Resp:  [18-20] 18  (02/02 0444) BP: (112-137)/(39-66) 137/52 mmHg (02/02 0444) SpO2:  [90 %-98 %] 98 % (02/02 0444) Weight:  [94.439 kg (208 lb 3.2 oz)] 94.439 kg (208 lb 3.2 oz) (02/02 0450)  Intake/Output from previous day: 02/01 0701 - 02/02 0700 In: 360 [P.O.:360] Out: -   Scheduled:   . allopurinol  100 mg Oral Daily  . amiodarone  400 mg Oral BID  . aspirin EC  81 mg Oral Daily  . colesevelam  1,875 mg Oral BID WC  . docusate sodium  200 mg Oral Daily  . enoxaparin (LOVENOX) injection  30 mg Subcutaneous Q24H  . ezetimibe  10 mg Oral Daily  . folic acid  1 mg Oral Daily  . furosemide  40 mg Oral Daily  . insulin aspart  0-24 Units Subcutaneous TID AC & HS  . levothyroxine  100 mcg Oral QAC breakfast  . metoprolol      . metoprolol  2.5 mg Intravenous Once  . metoprolol  2.5 mg Intravenous Once  . metoprolol tartrate  25 mg Oral BID  . pantoprazole  40 mg Oral QAC breakfast  . polysaccharide iron  150 mg Oral Daily  . potassium chloride  20 mEq Oral Daily  . sodium chloride  3 mL Intravenous Q12H  . DISCONTD: amiodarone  200 mg Oral BID  . DISCONTD: enoxaparin  30 mg Subcutaneous Q24H  . DISCONTD: enoxaparin  1 mg/kg Subcutaneous Q12H  . DISCONTD: enoxaparin  1 mg/kg Subcutaneous Q24H  . DISCONTD: enoxaparin (LOVENOX) injection  1 mg/kg Subcutaneous Q24H  . DISCONTD: metoprolol tartrate  12.5 mg Oral BID    Physical Exam:   General appearance: alert, cooperative and no distress Neck: no adenopathy, no carotid bruit, no JVD, supple, symmetrical, trachea midline and thyroid not enlarged, symmetric, no tenderness/mass/nodules Lungs: diminished breath sounds bases Heart: S1, S2  normal no rub; 1/6 sem Abdomen: soft Extremities: trace edema bilaterally   Rate: 90  Rhythm: NSR  Lab Results: Results for orders placed during the hospital encounter of 07/30/11 (from the past 24 hour(s))  GLUCOSE, CAPILLARY     Status: Abnormal   Collection Time   08/12/11 11:39 AM      Component Value Range   Glucose-Capillary 130 (*) 70 - 99 (mg/dL)   Comment 1 Documented in Chart     Comment 2 Notify RN    GLUCOSE, CAPILLARY     Status: Abnormal   Collection Time   08/12/11  4:16 PM      Component Value Range   Glucose-Capillary 123 (*) 70 - 99 (mg/dL)  GLUCOSE, CAPILLARY     Status: Abnormal   Collection Time   08/12/11  8:27 PM      Component Value Range   Glucose-Capillary 129 (*) 70 - 99 (mg/dL)  GLUCOSE, CAPILLARY     Status: Abnormal   Collection Time   08/13/11  6:10 AM      Component Value Range   Glucose-Capillary 101 (*) 70 - 99 (mg/dL)   Comment 1 Documented in Chart     Comment 2 Notify RN      Basename 08/12/11 0452 08/11/11 0430  NA 139 137  K 4.0 3.6  CL 104 101  CO2 26 28  GLUCOSE 84 115*  BUN 36* 38*  CREATININE 1.45* 1.56*   No results found for this basename: TROPONINI:2,CK,MB:2 in the last 72 hours Hepatic Function Panel No results found for this basename: PROT,ALBUMIN,AST,ALT,ALKPHOS,BILITOT,BILIDIR,IBILI in the last 72 hours No results found for this basename: INR in the last 72 hours   Lipid Panel     Component Value Date/Time   CHOL 169 07/31/2011 0535   TRIG 64 07/31/2011 0535   HDL 68 07/31/2011 0535   CHOLHDL 2.5 07/31/2011 0535   VLDL 13 07/31/2011 0535   LDLCALC 88 07/31/2011 0535     Imaging:  No results found.      Assessment/Plan:   Principal Problem:  *Chest pain at rest, possible cardiac. Active Problems:  CAD, multiple vessel, severe disease, no stents placed only angioplasty to mid RCA.   Dyslipidemia  Diabetes type 2, controlled  Episodic lightheadedness, with near syncope X 3  Racing heart beat,  episodically  S/P CABG x 3,LIMA - sequential prox-mid LAD, SVG-OM, SVG- RPDA, 08/05/11  Paroxysmal atrial fibrillation, post CABG  Anemia associated with acute blood loss, related to CABG   Doing well; converted back to NSR.  Continue diuresis. Will increase lopressor to 25 mg tid. Cardiac Rehab. Add crestor at 5 mg and consider ACE if renal fxn stabilizes.  [name] Bihari, MD, [facility] 08/13/2011, 8:08 AM

## 2011-08-13 NOTE — Progress Notes (Addendum)
8 Days Post-Op Procedure(s) (LRB): CORONARY ARTERY BYPASS GRAFTING (CABG) (N/A)  Subjective: Patient feels ok-no real complaints.  Objective: Vital signs in last 24 hours: Patient Vitals for the past 24 hrs:  BP Temp Temp src Pulse Resp SpO2 Weight  08/12/11 0626 127/67 mmHg 97.4 F (36.3 C) Oral 90  18  98 % 208 lb 8 oz (94.575 kg)  08/11/11 2102 139/64 mmHg 97.4 F (36.3 C) Oral 89  18  100 % -  08/11/11 1900 132/38 mmHg - - 95  16  99 % -  08/11/11 1851 - 98.4 F (36.9 C) Oral - - - -  08/11/11 1800 115/28 mmHg - - 98  19  100 % -  08/11/11 1700 103/86 mmHg - - 93  19  98 % -  08/11/11 1600 117/65 mmHg - - 86  18  99 % -   Pre op weight  91.6 kg Current Weight  08/13/11 208 lb 3.2 oz (94.439 kg)      Intake/Output from previous day: 02/01 0701 - 02/02 0700 In: 360 [P.O.:360] Out: -    Physical Exam:  Cardiovascular: RRR, no murmurs, gallops, or rubs. Pulmonary: Slightly decreased at bases; no rales, wheezes, or rhonchi. Abdomen: Soft, non tender, bowel sounds present. Extremities: Bilateral lower extremity edema. Wounds: Sternal wound is clean and dry.  No erythema or signs of infection.Lower extremity wounds are clean and dry.  Lab Results: CBC:  Basename 08/12/11 0452 08/11/11 0430  WBC 10.2 12.9*  HGB 8.5* 8.8*  HCT 25.7* 26.5*  PLT 293 217   BMET:   Basename 08/13/11 0630 08/12/11 0452  NA 137 139  K 4.7 4.0  CL 104 104  CO2 24 26  GLUCOSE 104* 84  BUN 40* 36*  CREATININE 1.58* 1.45*  CALCIUM 10.0 9.6    PT/INR: No results found for this basename: LABPROT,INR in the last 72 hours ABG:  INR: Will add last result for INR, ABG once components are confirmed Will add last 4 CBG results once components are confirmed  Assessment/Plan:  1. CV - Brief run of aflutter about 12:58 am but converted and has stayed in SR since then. Continue Amiodarone 200 bid. Continue Lopressor 25 bid.  2.  Pulmonary - Encourage incentive spirometer.Wean off O2 as  tolerates. 3. Volume Overload - Hold Lasix as creatinine increased 4.  Acute blood loss anemia - H/H slightly decreased this am to 8.5/25.7. Continue  Nu Iron. 5.Mild renal insufficiency-Cr increased to 1.58 this am. 6.DM-CBGs 123/129/101.Not restarted on Amaryl as creatnine elevated. and discontinue any scheduled insulin. Pre op HGA1C was 5.9.   [name],[name] MPA-C 08/13/2011   08/13/2011 9:54 AM   holding in sinus I have seen and examined [name] and agree with the above assessment  and plan.  [name] MD Beeper [phone] Office [phone] 08/13/2011 1:09 PM

## 2011-08-13 NOTE — Progress Notes (Signed)
CARDIAC REHAB PHASE I   PRE:  Rate/Rhythm: 79 sinus  BP:  Supine:   Sitting: 113/61  Standing:    SaO2: 98 2L  MODE:  Ambulation: 300 ft SaO2 91% RA  POST:  Rate/Rhythem: 86  BP:  Supine:   Sitting: 128/68  Standing:    SaO2: 96 2L  Pt ambulated 365ft with assist x2 using rolling walker.  Pt tolerated walk fairly well, c/o getting hot and tired, thus she did take standing rest breaks x5.  She was returned to chair with call bell in reach after walk.  D/c education was done, but she declined Cardiac Rehab Phase II due to transportation and insurance co-pays.  We will f/u on Monday if still here. Fabio Pierce, MA, ACSM RCEP [phone]  Hazle Nordmann

## 2011-08-13 NOTE — Progress Notes (Signed)
Pt converted in sinus rhythm at 21:29  Noble Cicalese Diona Foley, RN

## 2011-08-14 LAB — BASIC METABOLIC PANEL
CO2: 26 mEq/L (ref 19–32)
Calcium: 9.8 mg/dL (ref 8.4–10.5)
Chloride: 105 mEq/L (ref 96–112)
Glucose, Bld: 119 mg/dL — ABNORMAL HIGH (ref 70–99)
Sodium: 138 mEq/L (ref 135–145)

## 2011-08-14 LAB — GLUCOSE, CAPILLARY
Glucose-Capillary: 144 mg/dL — ABNORMAL HIGH (ref 70–99)
Glucose-Capillary: 151 mg/dL — ABNORMAL HIGH (ref 70–99)
Glucose-Capillary: 120 mg/dL — ABNORMAL HIGH (ref 70–99)
Glucose-Capillary: 118 mg/dL — ABNORMAL HIGH (ref 70–99)

## 2011-08-14 MED ORDER — METOPROLOL TARTRATE 25 MG PO TABS: 25.0000 mg | ORAL_TABLET | Freq: Two times a day (BID) | ORAL | Status: DC

## 2011-08-14 MED ORDER — FUROSEMIDE 20 MG PO TABS
20.0000 mg | ORAL_TABLET | Freq: Every day | ORAL | Status: DC
  Administered 2011-08-14: 20 mg via ORAL
  Filled 2011-08-14 (×2): qty 1

## 2011-08-14 MED ORDER — AMIODARONE HCL 200 MG PO TABS
400.0000 mg | ORAL_TABLET | Freq: Every day | ORAL | Status: DC
  Administered 2011-08-14 – 2011-08-15 (×2): 400 mg via ORAL
  Filled 2011-08-14 (×2): qty 2

## 2011-08-14 MED ORDER — FUROSEMIDE 20 MG PO TABS: 20.0000 mg | ORAL_TABLET | Freq: Every day | ORAL | Status: DC

## 2011-08-14 MED ORDER — ROSUVASTATIN CALCIUM 5 MG PO TABS: 5.0000 mg | ORAL_TABLET | Freq: Every day | ORAL | Status: DC

## 2011-08-14 MED ORDER — LACTULOSE 10 GM/15ML PO SOLN
20.0000 g | Freq: Once | ORAL | Status: AC
  Administered 2011-08-14: 20 g via ORAL
  Filled 2011-08-14: qty 30

## 2011-08-14 MED ORDER — AMIODARONE HCL 400 MG PO TABS: 400.0000 mg | ORAL_TABLET | Freq: Every day | ORAL | Status: DC

## 2011-08-14 MED ORDER — POTASSIUM CHLORIDE CRYS ER 20 MEQ PO TBCR: 10.0000 meq | EXTENDED_RELEASE_TABLET | Freq: Every day | ORAL | Status: DC

## 2011-08-14 MED ORDER — POTASSIUM CHLORIDE CRYS ER 20 MEQ PO TBCR: 10.0000 meq | EXTENDED_RELEASE_TABLET | ORAL | Status: DC

## 2011-08-14 NOTE — Discharge Summary (Signed)
Addendum Note to D/C [name] is a 78 y.o. female who is S/P Procedure(s): CORONARY ARTERY BYPASS GRAFTING (CABG).  She had some aflutter/afib. She was placed on oral Amiodarone and converted to sinus rhythm. Her glucose remained well controlled with insulin PRN. Amaryl will be restarted upon discharge (as long as renal function is ok).The patient is stable and ready for discharge.  No changes to History, Physical Exam,  or D/C instructions.  Changes to her medications include: Amiodarone 400 mg po daily Lopressor 25 mg po two times daily Lasix 20 mg po daily Potassium chloride 10 meq po every other day. Crestor 5 mg po daily.  [name] 08/14/2011 10:32 AM

## 2011-08-14 NOTE — Progress Notes (Signed)
[facility] Progress Note  Subjective:  No CP or SOB  Objective:   Vital Signs in the last 24 hours: Temp:  [97.3 F (36.3 C)-97.8 F (36.6 C)] 97.4 F (36.3 C) (02/03 0620) Pulse Rate:  [75-80] 75  (02/03 0620) Resp:  [16-18] 18  (02/03 0620) BP: (122-156)/(59-81) 122/76 mmHg (02/03 0620) SpO2:  [96 %-99 %] 99 % (02/03 0620) Weight:  [94 kg (207 lb 3.7 oz)] 94 kg (207 lb 3.7 oz) (02/03 0243)  Intake/Output from previous day: 02/02 0701 - 02/03 0700 In: 240 [P.O.:240] Out: 100 [Urine:100]    . allopurinol  100 mg Oral Daily  . amiodarone  400 mg Oral BID  . aspirin EC  81 mg Oral Daily  . colesevelam  1,875 mg Oral BID WC  . docusate sodium  200 mg Oral Daily  . enoxaparin (LOVENOX) injection  30 mg Subcutaneous Q24H  . ezetimibe  10 mg Oral Daily  . folic acid  1 mg Oral Daily  . furosemide  40 mg Oral Daily  . insulin aspart  0-24 Units Subcutaneous TID AC & HS  . levothyroxine  100 mcg Oral QAC breakfast  . metoprolol  2.5 mg Intravenous Once  . metoprolol tartrate  25 mg Oral TID  . pantoprazole  40 mg Oral QAC breakfast  . polysaccharide iron  150 mg Oral Daily  . rosuvastatin  5 mg Oral q1800  . sodium chloride  3 mL Intravenous Q12H  . DISCONTD: furosemide  40 mg Oral Daily  . DISCONTD: metoprolol tartrate  25 mg Oral BID  . DISCONTD: potassium chloride  20 mEq Oral Daily     Physical Exam:   [place]/AT No JVD Decreased BS at bases RRR 1/6 SEM Abd  Soft Trivial edema   Rate: 80  Rhythm: sinus  Lab Results: Results for orders placed during the hospital encounter of 07/30/11 (from the past 24 hour(s))  GLUCOSE, CAPILLARY     Status: Abnormal   Collection Time   08/13/11 11:30 AM      Component Value Range   Glucose-Capillary 124 (*) 70 - 99 (mg/dL)   Comment 1 Documented in Chart     Comment 2 Notify RN    GLUCOSE, CAPILLARY     Status: Abnormal   Collection Time   08/13/11  4:21 PM      Component Value Range   Glucose-Capillary 117 (*) 70 - 99 (mg/dL)  GLUCOSE, CAPILLARY     Status: Abnormal   Collection Time   08/13/11  9:06 PM      Component Value Range   Glucose-Capillary 124 (*) 70 - 99 (mg/dL)  BASIC METABOLIC PANEL     Status: Abnormal   Collection Time   08/14/11  5:05 AM      Component Value Range   Sodium 138  135 - 145 (mEq/L)   Potassium 4.7  3.5 - 5.1 (mEq/L)   Chloride 105  96 - 112 (mEq/L)   CO2 26  19 - 32 (mEq/L)   Glucose, Bld 119 (*) 70 - 99 (mg/dL)   BUN 43 (*) 6 - 23 (mg/dL)   Creatinine, Ser 1.61 (*) 0.50 - 1.10 (mg/dL)   Calcium 9.8  8.4 - 09.6 (mg/dL)   GFR calc non Af Amer 29 (*) >90 (mL/min)   GFR calc Af Amer 33 (*) >90 (mL/min)    Basename 08/14/11 0505 08/13/11 0630  NA 138 137  K 4.7 4.7  CL 105 104  CO2 26 24  GLUCOSE 119* 104*  BUN 43* 40*  CREATININE 1.56* 1.58*   No results found for this basename: TROPONINI:2,CK,MB:2 in the last 72 hours Hepatic Function Panel No results found for this basename: PROT,ALBUMIN,AST,ALT,ALKPHOS,BILITOT,BILIDIR,IBILI in the last 72 hours No results found for this basename: INR in the last 72 hours     Imaging:  No results found.    Assessment/Plan:   Principal Problem:  *Chest pain at rest, possible cardiac. Active Problems:  CAD, multiple vessel, severe disease, no stents placed only angioplasty to mid RCA.   Dyslipidemia  Diabetes type 2, controlled  Episodic lightheadedness, with near syncope X 3  Racing heart beat, episodically  S/P CABG x 3,LIMA - sequential prox-mid LAD, SVG-OM, SVG- RPDA, 08/05/11  Paroxysmal atrial fibrillation, post CABG  Anemia associated with acute blood loss, related to CABG   Day 8 s/p CABG.  Maintaining NSR without further AF. Will decrease amiodarone to 400 mg daily and tomorrow titrate metoprolol to 50 bid.  Decrease lasix to 20 mg with  Increased BUN, Cr.  Lennette Bihari, MD, [facility] 08/14/2011, 8:01 AM

## 2011-08-14 NOTE — Progress Notes (Signed)
Patient ambulated 150 with rolling walker on 2 L O2 [place].  O2 sats without O2 dropped to 79 and with 2 L rebounded to 86.  After patient returned to room sats came up to 96 with 2L.  Patient had to stop 3 times to catch breath and said that she felt very weak.

## 2011-08-14 NOTE — Progress Notes (Signed)
Dc'd epw's per hospital protocol. Wires intact and patient tolerated well. rn instructed patient to stay in bed for 1 hour or until 1335. RN will continue to monitor patient.

## 2011-08-14 NOTE — Progress Notes (Addendum)
9 Days Post-Op Procedure(s) (LRB): CORONARY ARTERY BYPASS GRAFTING (CABG) (N/A)  Subjective: Patient feeling ok this am.  Objective: Vital signs in last 24 hours: Patient Vitals for the past 24 hrs:  BP Temp Temp src Pulse Resp SpO2 Weight  08/12/11 0626 127/67 mmHg 97.4 F (36.3 C) Oral 90  18  98 % 208 lb 8 oz (94.575 kg)  08/11/11 2102 139/64 mmHg 97.4 F (36.3 C) Oral 89  18  100 % -  08/11/11 1900 132/38 mmHg - - 95  16  99 % -  08/11/11 1851 - 98.4 F (36.9 C) Oral - - - -  08/11/11 1800 115/28 mmHg - - 98  19  100 % -  08/11/11 1700 103/86 mmHg - - 93  19  98 % -  08/11/11 1600 117/65 mmHg - - 86  18  99 % -   Pre op weight  91.6 kg Current Weight  08/14/11 207 lb 3.7 oz (94 kg)      Intake/Output from previous day: 02/02 0701 - 02/03 0700 In: 240 [P.O.:240] Out: 100 [Urine:100]   Physical Exam:  Cardiovascular: RRR, no murmurs, gallops, or rubs. Pulmonary: Slightly decreased at bases; no rales, wheezes, or rhonchi. Abdomen: Soft, non tender, bowel sounds present. Extremities: Bilateral lower extremity edema. Wounds: Sternal wound is clean and dry.  No erythema or signs of infection.Lower extremity wounds are clean and dry.  Lab Results: CBC:  Basename 08/12/11 0452  WBC 10.2  HGB 8.5*  HCT 25.7*  PLT 293   BMET:   Basename 08/14/11 0505 08/13/11 0630  NA 138 137  K 4.7 4.7  CL 105 104  CO2 26 24  GLUCOSE 119* 104*  BUN 43* 40*  CREATININE 1.56* 1.58*  CALCIUM 9.8 10.0    PT/INR: No results found for this basename: LABPROT,INR in the last 72 hours ABG:  INR: Will add last result for INR, ABG once components are confirmed Will add last 4 CBG results once components are confirmed  Assessment/Plan:  1. CV - Brief run of aflutter about 12:58 am 2/2 but converted and has stayed in SR since then. Continue Amiodarone 400 bid and Lopressor 25 bid. Per Dr. Tresa Endo, will decrease Amiodarone to 400 mg po daily and increase Lopressor to 50 bid in am. 2.   Pulmonary - Encourage incentive spirometer.Will likely need O2 upon discharge.Check PA/LAT CXR am. 3. Volume Overload - Lasix 20 mg ordered by cardiology. 4.  Acute blood loss anemia - H/H slightly decreased this am to 8.5/25.7. Continue  Nu Iron. 5.Mild renal insufficiency-Cr 1.56 this am. 6.DM-CBGs 117/124/144 .Not restarted on Amaryl as creatnine elevated. Continue sliding scale. Pre op HGA1C was 5.9. 7.LOC for constipation. 8.Remove EPW. 9.Patient is now on Crestor 5 daily as well as Welchol and Zetia, which she had taken pre op. Consider stopping one?  [name],[name] MPA-C 08/14/2011   08/14/2011 9:25 AM  Slowly improving Holding sinus Poss home 1- 2 days with home health and rehab I have seen and examined [name] and agree with the above assessment  and plan.  [name] MD Beeper [phone] Office [phone] 08/14/2011 12:01 PM

## 2011-08-14 NOTE — Progress Notes (Signed)
RA sat @ rest 92, RA sat while ambulating 82. Patient required 2L O2 via [place] to return sats to 96.

## 2011-08-14 NOTE — Progress Notes (Signed)
   CARE MANAGEMENT NOTE 08/14/2011  Patient:  ADLYN, FIFE   Account Number:  [id number]  Date Initiated:  08/08/2011  Documentation initiated by:  [facility]  Subjective/Objective Assessment:   post op CABG x4 on 08-05-11 after STEMI.  Has Children     Action/Plan:   PTA, PT INDEPENDENT, LIVES WITH SPOUSE.   Anticipated DC Date:  08/15/2011   Anticipated DC Plan:  HOME W HOME HEALTH SERVICES  In-house referral  Clinical Social Worker      DC Planning Services  CM consult      [facility] Choice  HOME HEALTH   Choice offered to / List presented to:  C-1 Patient   DME arranged  [facility]      DME agency  [facility]     [facility] arranged  HH-1 RN  HH-2 PT  HH-3 OT  HH-4 NURSE'S AIDE      HH agency  [facility]   Status of service:  In process, will continue to follow Medicare Important Message given?   (If response is "NO", the following Medicare IM given date fields will be blank) Date Medicare IM given:   Date Additional Medicare IM given:    Discharge Disposition:  HOME W HOME HEALTH SERVICES  Per UR Regulation:  Reviewed for med. necessity/level of care/duration of stay  Comments:  08/14/2011 1430 Call [facility] for home oxygen. Pt wants to pursue oxygen and [facility] rep explained oxygen may not be covered. Isidoro Donning RN CCM Case Mgmt phone [phone]   08/12/11 JULIE AMERSON,RN,BSN 1621 CONFIRMED THAT PT'S Gretta Arab [phone]) WILL PROVIDE 24HR CARE AT DISCHARGE.  PT HAS PROGRESSED WELL, AND IS NOW SAFE TO GO HOME WITH HH.  WILL ARRANGE SERVICES WITH [facility], PER CHOICE. START OF CARE 24-48H POST DC DATE. MAY NEED HOME O2 IF UNABLE TO WEAN, BUT NO ORDERS PRESENTLY.  WILL FOLLOW.   08-10-11 10:40am Avie Arenas, RNBSN [phone] UR Completed.  Now off pressors through night - post op fib - changing IV amio to po.  08/08/11 JULIE AMERSON,RN,BSN 1430 PT SLOW TO PROGRESS S/P CAGB X 4.  PT AND OT RECOMMENDING SNF FOR REHAB AT DISCHARGE.  WILL  CONSULT CSW TO FACILITATE DC TO SNF WHEN MEDICALLY STABLE.  08-08-11 2pm Avie Arenas, RNBSN [phone] UR completed.

## 2011-08-15 ENCOUNTER — Inpatient Hospital Stay (HOSPITAL_COMMUNITY): Payer: Medicare Other

## 2011-08-15 LAB — BASIC METABOLIC PANEL
Chloride: 106 mEq/L (ref 96–112)
GFR calc Af Amer: 34 mL/min — ABNORMAL LOW (ref >90)
Potassium: 4.8 mEq/L (ref 3.5–5.1)
Sodium: 139 mEq/L (ref 135–145)

## 2011-08-15 LAB — GLUCOSE, CAPILLARY: Glucose-Capillary: 109 mg/dL — ABNORMAL HIGH (ref 70–99)

## 2011-08-15 MED ORDER — EZETIMIBE 10 MG PO TABS: 10.0000 mg | ORAL_TABLET | Freq: Every day | ORAL | Status: DC

## 2011-08-15 MED ORDER — COLESEVELAM HCL 625 MG PO TABS: 1875.0000 mg | ORAL_TABLET | Freq: Two times a day (BID) | ORAL | Status: DC

## 2011-08-15 NOTE — Progress Notes (Signed)
[facility]  Subjective: No complaints.  Some SOB with ambulation yesterday.  She had to stop and rest for a minute.  Objective: Vital signs in last 24 hours: Temp:  [97 F (36.1 C)-97.1 F (36.2 C)] 97 F (36.1 C) (02/04 0549) Pulse Rate:  [68-77] 68  (02/04 0549) Resp:  [16-18] 18  (02/04 0549) BP: (107-147)/(39-85) 122/60 mmHg (02/04 0549) SpO2:  [93 %-98 %] 93 % (02/04 0549) Weight:  [93.7 kg (206 lb 9.1 oz)] 93.7 kg (206 lb 9.1 oz) (02/04 0549) Last BM Date: 08/14/11  Intake/Output from previous day: 02/03 0701 - 02/04 0700 In: 1565 [P.O.:1565] Out: 1101 [Urine:1100; Stool:1] Intake/Output this shift:    Medications Current Facility-Administered Medications  Medication Dose Route Frequency Provider Last Rate Last Dose  . 0.9 %  sodium chloride infusion  250 mL Intravenous PRN [name], MD      . allopurinol (ZYLOPRIM) tablet 100 mg  100 mg Oral Daily Leone Brand, NP   100 mg at 08/14/11 1041  . amiodarone (PACERONE) tablet 400 mg  400 mg Oral Daily Lennette Bihari, MD   400 mg at 08/14/11 1041  . aspirin EC tablet 81 mg  81 mg Oral Daily [name], MD   81 mg at 08/14/11 1041  . bisacodyl (DULCOLAX) EC tablet 10 mg  10 mg Oral Daily PRN [name], MD       Or  . bisacodyl (DULCOLAX) suppository 10 mg  10 mg Rectal Daily PRN [name], MD      . colesevelam [facility]) tablet 1,875 mg  1,875 mg Oral BID WC Leone Brand, NP   1,875 mg at 08/14/11 1711  . docusate sodium (COLACE) capsule 200 mg  200 mg Oral Daily [name], MD   200 mg at 08/14/11 1041  . enoxaparin (LOVENOX) injection 30 mg  30 mg Subcutaneous Q24H [name], MD   30 mg at 08/14/11 2000  . ezetimibe (ZETIA) tablet 10 mg  10 mg Oral Daily Leone Brand, NP   10 mg at 08/14/11 1041  . folic acid (FOLVITE) tablet 1 mg  1 mg Oral Daily Ardelle Balls, PA   1 mg at 08/14/11 1041  . furosemide (LASIX) tablet 20 mg  20 mg  Oral Daily Lennette Bihari, MD   20 mg at 08/14/11 1041  . insulin aspart (novoLOG) injection 0-24 Units  0-24 Units Subcutaneous TID AC & HS [name], MD   2 Units at 08/14/11 1144  . levothyroxine (SYNTHROID, LEVOTHROID) tablet 100 mcg  100 mcg Oral QAC breakfast Leone Brand, NP   100 mcg at 08/14/11 0827  . metoprolol (LOPRESSOR) injection 2.5 mg  2.5 mg Intravenous Once Leone Brand, NP      . metoprolol tartrate (LOPRESSOR) tablet 25 mg  25 mg Oral TID Lennette Bihari, MD   25 mg at 08/14/11 2232  . pantoprazole (PROTONIX) EC tablet 40 mg  40 mg Oral QAC breakfast [name], MD   40 mg at 08/14/11 0827  . polysaccharide iron (NIFEREX) capsule 150 mg  150 mg Oral Daily Ardelle Balls, PA   150 mg at 08/14/11 1041  . rosuvastatin (CRESTOR) tablet 5 mg  5 mg Oral q1800 Lennette Bihari, MD   5 mg at 08/14/11 1711  . sodium chloride 0.9 % injection 3 mL  3 mL Intravenous Q12H [name], MD  3 mL at 08/14/11 2233  . sodium chloride 0.9 % injection 3 mL  3 mL Intravenous PRN [name], MD   3 mL at 08/11/11 1008  . traMADol (ULTRAM) tablet 50 mg  50 mg Oral Q12H PRN [name], MD        PE: General appearance: alert, cooperative and no distress Lungs: Decreased BS in left Base. Heart: regular rate and rhythm, S1, S2 normal, no murmur, click, rub or gallop Extremities: 2+ LEE. Pulses: 2+ and symmetric radials.  Decreased right DP, 2+ left DP,  Lower ext warm.  Lab Results:  No results found for this basename: WBC:3,HGB:3,HCT:3,PLT:3 in the last 72 hours BMET  Basename 08/15/11 0640 08/14/11 0505 08/13/11 0630  NA 139 138 137  K 4.8 4.7 4.7  CL 106 105 104  CO2 25 26 24   GLUCOSE 119* 119* 104*  BUN 43* 43* 40*  CREATININE 1.53* 1.56* 1.58*  CALCIUM 10.0 9.8 10.0   08/15/11, CHEST - 2 VIEW  Comparison: 08/11/2011  Findings: Stable positioning of central line. Evidence of  persistent left lower lobe atelectasis with component of left    pleural fluid. Mild right basilar atelectasis is stable. No edema  or pneumothorax identified. Mediastinal contours are stable.  IMPRESSION:  Stable left lower lobe atelectasis with component of left pleural  fluid.   Assessment/Plan  Principal Problem:  *Chest pain at rest, possible cardiac. Active Problems:  CAD, multiple vessel, severe disease, no stents placed only angioplasty to mid RCA.   Dyslipidemia  Diabetes type 2, controlled  Episodic lightheadedness, with near syncope X 3  Racing heart beat, episodically  S/P CABG x 3,LIMA - sequential prox-mid LAD, SVG-OM, SVG- RPDA, 08/05/11  Paroxysmal atrial fibrillation, post CABG  Anemia associated with acute blood loss, related to CABG  Plan:  BP and HR controlled.  Likely DC home today with HH.  Lopressor will be 50mg  BID and Amio at 400mg  daily.   LOS: 16 days    HAGER,BRYAN W 08/15/2011 9:26 AM  Patient seen and examined. Agree with assessment and plan.  Feeling better. Ambulating with Rehab.  Maintaining NSR. No further A Fib, as outpt amio dose to be further reduced.   Lennette Bihari, MD, [facility] 08/15/2011 10:27 AM

## 2011-08-15 NOTE — Progress Notes (Signed)
CARDIAC REHAB PHASE I   PRE:  Rate/Rhythm: 71 SR  BP:  Supine: 94/60  Sitting:   Standing:    SaO2: 97 RA  MODE:  Ambulation: 240 ft   POST:  Rate/Rhythem: 85  BP:  Supine:   Sitting: 144/68 in hall 170/66 after walk  Standing:    SaO2: 95 2l in hall 93 2L after walk 1005-1040  Assisted X 2 and used O2 2L and walker to ambulate. Gait steady with walker. Pt  tires easily and is DOE. Became hot in hall and felt need to sit . She took one sitting rest stop and several standing one. She is very deconditioned. Encouraged her to continue walking three times a day at home.  Beatrix Fetters

## 2011-08-15 NOTE — Progress Notes (Addendum)
[address]            [place] [address]          [phone]     10 Days Post-Op  Procedure(s) (LRB): CORONARY ARTERY BYPASS GRAFTING (CABG) (N/A) Subjective: Feels ok   Objective  Telemetry NSR  Temp:  [97 F (36.1 C)-97.1 F (36.2 C)] 97 F (36.1 C) (02/04 0549) Pulse Rate:  [68-77] 68  (02/04 0549) Resp:  [16-18] 18  (02/04 0549) BP: (107-147)/(39-85) 122/60 mmHg (02/04 0549) SpO2:  [93 %-98 %] 93 % (02/04 0549) Weight:  [206 lb 9.1 oz (93.7 kg)] 206 lb 9.1 oz (93.7 kg) (02/04 0549)   Intake/Output Summary (Last 24 hours) at 08/15/11 0744 Last data filed at 08/15/11 0551  Gross per 24 hour  Intake   1565 ml  Output   1101 ml  Net    464 ml       General appearance: alert, cooperative and no distress Heart: regular rate and rhythm and S1, S2 normal Lungs: mildly diminished in bases Abdomen: soft, non tender, non distended Extremities: + BLE edema Wound: incisions healing without signs of infection  Lab Results:  Basename 08/14/11 0505 08/13/11 0630  NA 138 137  K 4.7 4.7  CL 105 104  CO2 26 24  GLUCOSE 119* 104*  BUN 43* 40*  CREATININE 1.56* 1.58*  CALCIUM 9.8 10.0  MG -- --  PHOS -- --   No results found for this basename: AST:2,ALT:2,ALKPHOS:2,BILITOT:2,PROT:2,ALBUMIN:2 in the last 72 hours No results found for this basename: LIPASE:2,AMYLASE:2 in the last 72 hours No results found for this basename: WBC:2,NEUTROABS:2,HGB:2,HCT:2,MCV:2,PLT:2 in the last 72 hours No results found for this basename: CKTOTAL:4,CKMB:4,TROPONINI:4 in the last 72 hours No components found with this basename: POCBNP:3 No results found for this basename: DDIMER in the last 72 hours No results found for this basename: HGBA1C in the last 72 hours No results found for this basename: CHOL,HDL,LDLCALC,TRIG,CHOLHDL in the last 72 hours No results found for this basename: TSH,T4TOTAL,FREET3,T3FREE,THYROIDAB in the last 72 hours No results  found for this basename: VITAMINB12,FOLATE,FERRITIN,TIBC,IRON,RETICCTPCT in the last 72 hours  Medications: Scheduled    . allopurinol  100 mg Oral Daily  . amiodarone  400 mg Oral Daily  . aspirin EC  81 mg Oral Daily  . colesevelam  1,875 mg Oral BID WC  . docusate sodium  200 mg Oral Daily  . enoxaparin (LOVENOX) injection  30 mg Subcutaneous Q24H  . ezetimibe  10 mg Oral Daily  . folic acid  1 mg Oral Daily  . furosemide  20 mg Oral Daily  . insulin aspart  0-24 Units Subcutaneous TID AC & HS  . lactulose  20 g Oral Once  . levothyroxine  100 mcg Oral QAC breakfast  . metoprolol  2.5 mg Intravenous Once  . metoprolol tartrate  25 mg Oral TID  . pantoprazole  40 mg Oral QAC breakfast  . polysaccharide iron  150 mg Oral Daily  . rosuvastatin  5 mg Oral q1800  . sodium chloride  3 mL Intravenous Q12H  . DISCONTD: amiodarone  400 mg Oral BID  . DISCONTD: furosemide  40 mg Oral Daily     Radiology/Studies:  No results found.  INR: Will add last result for INR, ABG once components are confirmed Will add last 4 CBG results once components are confirmed  Assessment/Plan: S/P  Procedure(s) (LRB): CORONARY ARTERY BYPASS GRAFTING (CABG) (N/A) 1. Stable, O2 sats ok on 2 liters, for d/c on home O2 2 will leave on crestor as cholesterol agent and d/c others 3. cbg ok off amaryl 4.  Current meds for discharge Medication List  As of 08/15/2011  7:57 AM   STOP taking these medications         amLODipine 10 MG tablet      clopidogrel 75 MG tablet      colesevelam 625 MG tablet      ezetimibe 10 MG tablet      glimepiride 2 MG tablet      nitroGLYCERIN 0.4 MG SL tablet         TAKE these medications         acetaminophen 325 MG tablet   Commonly known as: TYLENOL   Take 650 mg by mouth 2 (two) times daily.      allopurinol 100 MG tablet   Commonly known as: ZYLOPRIM   Take 100 mg by mouth daily.      amiodarone 400 MG tablet   Commonly known as: PACERONE   Take 1  tablet (400 mg total) by mouth daily.      aspirin 81 MG tablet   Take 81 mg by mouth daily.      folic acid 1 MG tablet   Commonly known as: FOLVITE   Take 1 tablet (1 mg total) by mouth daily.      furosemide 20 MG tablet   Commonly known as: LASIX   Take 1 tablet (20 mg total) by mouth daily.      levothyroxine 100 MCG tablet   Commonly known as: SYNTHROID, LEVOTHROID   Take 100 mcg by mouth daily.      LORazepam 0.5 MG tablet   Commonly known as: ATIVAN   Take 0.5 mg by mouth every 8 (eight) hours.      metoprolol tartrate 25 MG tablet   Commonly known as: LOPRESSOR   Take 1 tablet (25 mg total) by mouth 2 (two) times daily.      pantoprazole 40 MG tablet   Commonly known as: PROTONIX   Take 1 tablet (40 mg total) by mouth daily at 12 noon.      polysaccharide iron 150 MG Caps capsule   Commonly known as: NIFEREX   Take 1 capsule (150 mg total) by mouth daily.      potassium chloride SA 20 MEQ tablet   Commonly known as: K-DUR,KLOR-CON   Take 0.5 tablets (10 mEq total) by mouth every other day.      rosuvastatin 5 MG tablet   Commonly known as: CRESTOR   Take 1 tablet (5 mg total) by mouth daily at 6 PM.      sodium bicarbonate 650 MG tablet   Take 650 mg by mouth daily.      traMADol 50 MG tablet   Commonly known as: ULTRAM   Take 1 tablet (50 mg total) by mouth every 12 (twelve) hours as needed for pain.            LOS: 16 days    Aika Brzoska E 2/4/20137:44 AM

## 2011-08-15 NOTE — Progress Notes (Signed)
Physical Therapy Treatment Patient Details Name: [name] MRN: [id number] DOB: 1921-03-21 Today's Date: 08/15/2011  PT Assessment/Plan  PT - Assessment/Plan Comments on Treatment Session: Pt s/p CABG with afib. Pt with maintained HR today and fatigued after activity. Pt required assist for pericare but was able to state 3/3 precautions independently today. Pt encouraged to continue ambulating with staff and family at discharge. PT Plan: Discharge plan remains appropriate Follow Up Recommendations: Home health PT;Supervision for mobility/OOB PT Goals  Acute Rehab PT Goals PT Goal: Rolling Supine to Left Side - Progress: Met PT Goal: Supine/Side to Sit - Progress: Met PT Goal: Sit to Supine/Side - Progress: Met PT Goal: Sit to Stand - Progress: Met PT Goal: Stand to Sit - Progress: Met PT Goal: Ambulate - Progress: Progressing toward goal Additional Goals PT Goal: Additional Goal #1 - Progress: Met  PT Treatment Precautions/Restrictions  Precautions Precautions: Sternal;Fall Precaution Comments: watch HR closely, gets tacky with activity Restrictions Weight Bearing Restrictions: No Other Position/Activity Restrictions: Sternal precautions Mobility (including Balance) Bed Mobility Rolling Left: 5: Supervision Left Sidelying to Sit: 4: Min assist;HOB flat Left Sidelying to Sit Details (indicate cue type and reason): cues to sequence and min assist to fully elevate trunk Sitting - Scoot to Edge of Bed: 6: Modified independent (Device/Increase time) Sit to Sidelying Left: 4: Min assist;HOB flat Sit to Sidelying Left Details (indicate cue type and reason): assist to elevate bilLE into bed Transfers Sit to Stand: 4: Min assist;From chair/3-in-1;From bed Sit to Stand Details (indicate cue type and reason): x 4 trials, bed, BSC, recliner. cues for anterior weight shift Stand to Sit: 5: Supervision Stand to Sit Details: cues for hand placement Ambulation/Gait Ambulation/Gait:  Yes Ambulation/Gait Assistance: 5: Supervision Ambulation/Gait Assistance Details (indicate cue type and reason): Pt declined hall amb secondary to having amb with cardiac rehab not long ago and fatigue from functional activities for discharge Ambulation Distance (Feet): 15 Feet (15'x2 and 8'x2.) Assistive device: Rolling walker Gait Pattern: Trunk flexed;Decreased stride length Stairs: Yes Stairs Assistance: 4: Min assist Stairs Assistance Details (indicate cue type and reason): cues for sequence Stair Management Technique: One rail Left;Forwards;Other (comment);Step to pattern (HHA right) Number of Stairs: 3  Height of Stairs: 8   Posture/Postural Control Posture/Postural Control: Postural limitations Exercise  General Exercises - Lower Extremity Long Arc Quad: AROM;Both;15 reps;Seated Hip Flexion/Marching: AROM;Both;Other reps (comment);Seated (15reps) End of Session PT - End of Session Equipment Utilized During Treatment: Gait belt Activity Tolerance: Patient limited by fatigue Patient left: in chair;with call bell in reach;with family/visitor present Nurse Communication: Mobility status for ambulation;Mobility status for transfers General Behavior During Session: [facility] for tasks performed Cognition: [facility] for tasks performed  Delorse Lek 08/15/2011, 12:10 PM Toney Sang, PT [phone]

## 2011-08-15 NOTE — Progress Notes (Signed)
UR Completed.  Willmar Stockinger Jane [phone] 08/15/2011  

## 2011-08-15 NOTE — Progress Notes (Signed)
   CARE MANAGEMENT NOTE 08/15/2011  Patient:  [name], [name]   Account Number:  [id number]  Date Initiated:  08/08/2011  Documentation initiated by:  [facility]  Subjective/Objective Assessment:   post op CABG x4 on 08-05-11 after STEMI.  Has Children     Action/Plan:   PTA, PT INDEPENDENT, LIVES WITH SPOUSE.   Anticipated DC Date:  08/15/2011   Anticipated DC Plan:  HOME W HOME HEALTH SERVICES  In-house referral  Clinical Social Worker      DC Planning Services  CM consult      [facility] Choice  HOME HEALTH   Choice offered to / List presented to:  C-1 Patient   DME arranged  OXYGEN  BEDSIDE COMMODE      DME agency  [facility]     HH arranged  HH-1 RN  HH-2 PT  HH-3 OT  HH-4 NURSE'S AIDE      HH agency  [facility]   Status of service:  Completed, signed off Medicare Important Message given?   (If response is "NO", the following Medicare IM given date fields will be blank) Date Medicare IM given:   Date Additional Medicare IM given:    Discharge Disposition:  HOME W HOME HEALTH SERVICES  Per UR Regulation:  Reviewed for med. necessity/level of care/duration of stay  Comments:  08/15/11 Harrison Zetina,RN,BSN 1100 PT FOR DISCHARGE HOME TODAY.  HOME O2 ARRANGED; PT HAS PORTABLE TANK IN ROOM FOR TRANSPORT HOME.  REQUESTS [facility] IN [facility] FOR HOME.  REFERRAL TO [facility] FOR DME.  NOTIFIED [facility] OF DC HOME TODAY. Phone #[phone]   08/14/2011 1430 Call [facility] for home oxygen. Pt wants to pursue oxygen and [facility] rep explained oxygen may not be covered. Isidoro Donning RN CCM Case Mgmt phone [phone]   08/12/11 Mica Ramdass,RN,BSN 1621 CONFIRMED THAT PT'S Gretta Arab [phone]) WILL PROVIDE 24HR CARE AT DISCHARGE.  PT HAS PROGRESSED WELL, AND IS NOW SAFE TO GO HOME WITH HH.  WILL ARRANGE SERVICES WITH [facility], PER CHOICE. START OF CARE 24-48H POST DC DATE. MAY NEED HOME O2 IF UNABLE TO WEAN, BUT NO ORDERS PRESENTLY.  WILL FOLLOW. Phone  #[phone]    08-10-11 10:40am Avie Arenas, RNBSN [phone] UR Completed.  Now off pressors through night - post op fib - changing IV amio to po.  08/08/11 Zeev Deakins,RN,BSN 1430 PT SLOW TO PROGRESS S/P CAGB X 4.  PT AND OT RECOMMENDING SNF FOR REHAB AT DISCHARGE.  WILL CONSULT CSW TO FACILITATE DC TO SNF WHEN MEDICALLY STABLE Phone #[phone] .  08-08-11 2pm Avie Arenas, RNBSN - [phone] UR completed.

## 2011-08-15 NOTE — Discharge Summary (Signed)
                   [address]            [name] [address]          [phone]    Addendum  Stable for d/c on home O2   Current meds Medication List  As of 08/15/2011  8:00 AM   STOP taking these medications         amLODipine 10 MG tablet      clopidogrel 75 MG tablet      colesevelam 625 MG tablet      ezetimibe 10 MG tablet      glimepiride 2 MG tablet      nitroGLYCERIN 0.4 MG SL tablet         TAKE these medications         acetaminophen 325 MG tablet   Commonly known as: TYLENOL   Take 650 mg by mouth 2 (two) times daily.      allopurinol 100 MG tablet   Commonly known as: ZYLOPRIM   Take 100 mg by mouth daily.      amiodarone 400 MG tablet   Commonly known as: PACERONE   Take 1 tablet (400 mg total) by mouth daily.      aspirin 81 MG tablet   Take 81 mg by mouth daily.      folic acid 1 MG tablet   Commonly known as: FOLVITE   Take 1 tablet (1 mg total) by mouth daily.      furosemide 20 MG tablet   Commonly known as: LASIX   Take 1 tablet (20 mg total) by mouth daily.      levothyroxine 100 MCG tablet   Commonly known as: SYNTHROID, LEVOTHROID   Take 100 mcg by mouth daily.      LORazepam 0.5 MG tablet   Commonly known as: ATIVAN   Take 0.5 mg by mouth every 8 (eight) hours.      metoprolol tartrate 25 MG tablet   Commonly known as: LOPRESSOR   Take 1 tablet (25 mg total) by mouth 2 (two) times daily.      pantoprazole 40 MG tablet   Commonly known as: PROTONIX   Take 1 tablet (40 mg total) by mouth daily at 12 noon.      polysaccharide iron 150 MG Caps capsule   Commonly known as: NIFEREX   Take 1 capsule (150 mg total) by mouth daily.      potassium chloride SA 20 MEQ tablet   Commonly known as: K-DUR,KLOR-CON   Take 0.5 tablets (10 mEq total) by mouth every other day.      rosuvastatin 5 MG tablet   Commonly known as: CRESTOR   Take 1 tablet (5 mg total) by mouth daily at 6 PM.      sodium bicarbonate 650  MG tablet   Take 650 mg by mouth daily.      traMADol 50 MG tablet   Commonly known as: ULTRAM   Take 1 tablet (50 mg total) by mouth every 12 (twelve) hours as needed for pain.

## 2011-08-15 NOTE — Progress Notes (Addendum)
Removed CT sutures and applied 1/2" steri strips.  Pt tolerated the procedure well.  Removed right subclavian central line and applied pressure and occlusive dressing.  Pt tolerated procedure well without any signs of distress.  Will continue to monitor. [name]  Pt/family given discharge instructions, follow up appointments, medications,s/sx of infection, when to call the doctor and equipment given.  Pt/family verbalized understanding of instructions. [name].

## 2011-08-16 MED FILL — Mannitol IV Soln 20%: INTRAVENOUS | Qty: 500 | Status: AC

## 2011-08-16 MED FILL — Heparin Sodium (Porcine) Inj 1000 Unit/ML: INTRAMUSCULAR | Qty: 4 | Status: AC

## 2011-08-16 MED FILL — Calcium Chloride Inj 10%: INTRAVENOUS | Qty: 10 | Status: AC

## 2011-08-16 MED FILL — Lidocaine HCl IV Inj 20 MG/ML: INTRAVENOUS | Qty: 5 | Status: AC

## 2011-08-16 MED FILL — Electrolyte-R (PH 7.4) Solution: INTRAVENOUS | Qty: 4000 | Status: AC

## 2011-08-16 MED FILL — Sodium Chloride IV Soln 0.9%: INTRAVENOUS | Qty: 1000 | Status: AC

## 2011-08-16 MED FILL — Sodium Bicarbonate IV Soln 8.4%: INTRAVENOUS | Qty: 50 | Status: AC

## 2011-08-16 MED FILL — Sodium Chloride Irrigation Soln 0.9%: Qty: 3000 | Status: AC

## 2011-08-23 ENCOUNTER — Ambulatory Visit (INDEPENDENT_AMBULATORY_CARE_PROVIDER_SITE_OTHER): Payer: Self-pay

## 2011-08-23 DIAGNOSIS — Z4802 Encounter for removal of sutures: Secondary | ICD-10-CM

## 2011-08-23 DIAGNOSIS — I2510 Atherosclerotic heart disease of native coronary artery without angina pectoris: Secondary | ICD-10-CM

## 2011-08-23 DIAGNOSIS — Z951 Presence of aortocoronary bypass graft: Secondary | ICD-10-CM

## 2011-08-23 NOTE — Progress Notes (Signed)
Removed 14 sutures from anterior Left leg. Removed 4 sutures from anterior Right Leg. No signs of infection and pt tolerated well.

## 2011-08-23 NOTE — Patient Instructions (Signed)
1st post op appt with Dr Tyrone Sage on 09/08/2011 at 12:00pm with CXR prior.

## 2011-08-24 ENCOUNTER — Other Ambulatory Visit: Payer: Self-pay | Admitting: Physician Assistant

## 2011-08-24 ENCOUNTER — Ambulatory Visit (INDEPENDENT_AMBULATORY_CARE_PROVIDER_SITE_OTHER): Payer: Medicare Other | Admitting: Physician Assistant

## 2011-08-24 VITALS — BP 144/78 | HR 63 | Temp 97.0°F | Resp 18

## 2011-08-24 DIAGNOSIS — T8132XA Disruption of internal operation (surgical) wound, not elsewhere classified, initial encounter: Secondary | ICD-10-CM

## 2011-08-24 NOTE — Progress Notes (Signed)
HPI:  Patient presents today for dehiscence of right lower extremity wound. Since hospital discharge the patient reports she has had occasional shortness of breath upon exertion and bloody like drainage from right lower extremity wound.  Current Outpatient Prescriptions  Medication Sig Dispense Refill  . amiodarone (PACERONE) 400 MG tablet Take 1/2 tablet (200 mg) by mouth daily.      Marland Kitchen acetaminophen (TYLENOL) 325 MG tablet Take 650 mg by mouth every 6 (six) hours as needed.       Marland Kitchen allopurinol (ZYLOPRIM) 100 MG tablet Take 100 mg by mouth daily.       Marland Kitchen aspirin 81 MG tablet Take 81 mg by mouth daily.        . colesevelam (WELCHOL) 625 MG tablet Take 3 tablets (1,875 mg total) by mouth 2 (two) times daily with a meal.  180 tablet  5  . ezetimibe (ZETIA) 10 MG tablet Take 1 tablet (10 mg total) by mouth daily.  30 tablet  5  . folic acid (FOLVITE) 1 MG tablet Take 1 tablet (1 mg total) by mouth daily.  30 tablet  1  . furosemide (LASIX) 40 MG tablet Take 1 tablet by mouth daily.      Marland Kitchen levothyroxine (SYNTHROID, LEVOTHROID) 100 MCG tablet Take 100 mcg by mouth daily.        Marland Kitchen LORazepam (ATIVAN) 0.5 MG tablet Take 0.5 mg by mouth every 8 (eight) hours as needed.       . metoprolol tartrate (LOPRESSOR) 25 MG tablet Take 1 tablet (25 mg total) by mouth 2 (two) times daily.  60 tablet  1  . pantoprazole (PROTONIX) 40 MG tablet Take 1 tablet (40 mg total) by mouth daily at 12 noon.  30 tablet  5  . polysaccharide iron (NIFEREX) 150 MG CAPS capsule Take 1 capsule (150 mg total) by mouth daily.  30 each  0  . potassium chloride SA (K-DUR,KLOR-CON) 20 MEQ tablet Take 20 mEq by mouth every other day.            . sodium bicarbonate 650 MG tablet Take 650 mg by mouth daily.        Vital Signs: BP 144/78, HR 63, RR 18, temp 97, O2 sat 93%  Physical Exam: Bilateral lower extremity wounds: Left lower extremity wounds clean, dry, well healed. Right lower extremity wounds proximally are clean and dry.  The wound just below the right knee is dehisced. There is no erythema or purulence. There is sero sanguinous ooze. Sternum is solid and wound is well healed.  Impression and Plan: Per Dr. Donata Clay, patient's right lower extremity was prepped and draped in a sterile fashion. Xylocaine was used to achieve local anesthetic. Several 3-0 Nylon sutures were placed. The wound was well approximated. There was some slight bloody ooze after.Dry dressing with Kerlex was applied. Patient tolerated the procedure well. She will return to see the physician assistant on Monday for a wound check. Patient and her daughter were informed that if she develops fever, chills, redness, increased drainage etc. she is to contact the office immediately.Per, Dr. Tyrone Sage, no antibiotic is needed at this time.      The patient's daughter had several questions regarding the medications. All of her medicines were reviewed. I decreased the Amiodarone to 200 mg po daily as her heart rate was in the low 60's and she was in SR. I discussed with Dr. Tyrone Sage about increasing her Lasix to 40 mg po daily (2++ edema bilaterally) and  the potassium supplement to 20 meq po daily. Also, she was on McLain, and Crestor. Patient has an intolerance to statins. She was instructed to continue with the Welchol and Zetia only.Finally, she was instructed to take Amaryl 2 mg po every morning.      I gave the patient a prescription to have a BMET drawn today. She had mildly elevated renal function (Cr 1.56) when she was discharged from the hospital.

## 2011-08-25 ENCOUNTER — Other Ambulatory Visit: Payer: Self-pay | Admitting: *Deleted

## 2011-08-25 DIAGNOSIS — F419 Anxiety disorder, unspecified: Secondary | ICD-10-CM

## 2011-08-25 LAB — BASIC METABOLIC PANEL
BUN: 43 mg/dL — ABNORMAL HIGH (ref 6–23)
CO2: 22 mEq/L (ref 19–32)
Calcium: 9.5 mg/dL (ref 8.4–10.5)
Chloride: 110 mEq/L (ref 96–112)
Creat: 1.85 mg/dL — ABNORMAL HIGH (ref 0.50–1.10)
Glucose, Bld: 105 mg/dL — ABNORMAL HIGH (ref 70–99)
Potassium: 5.7 mEq/L — ABNORMAL HIGH (ref 3.5–5.3)
Sodium: 142 mEq/L (ref 135–145)

## 2011-08-25 MED ORDER — LORAZEPAM 0.5 MG PO TABS: 0.5000 mg | ORAL_TABLET | Freq: Three times a day (TID) | ORAL | Status: DC | PRN

## 2011-08-29 ENCOUNTER — Other Ambulatory Visit: Payer: Self-pay | Admitting: Physician Assistant

## 2011-08-29 ENCOUNTER — Ambulatory Visit (INDEPENDENT_AMBULATORY_CARE_PROVIDER_SITE_OTHER): Payer: Self-pay | Admitting: Physician Assistant

## 2011-08-29 ENCOUNTER — Encounter: Payer: Self-pay | Admitting: Physician Assistant

## 2011-08-29 VITALS — BP 131/60 | HR 64 | Temp 96.8°F | Resp 16

## 2011-08-29 DIAGNOSIS — Z09 Encounter for follow-up examination after completed treatment for conditions other than malignant neoplasm: Secondary | ICD-10-CM

## 2011-08-29 DIAGNOSIS — Z951 Presence of aortocoronary bypass graft: Secondary | ICD-10-CM

## 2011-08-29 DIAGNOSIS — I2510 Atherosclerotic heart disease of native coronary artery without angina pectoris: Secondary | ICD-10-CM

## 2011-08-29 LAB — BASIC METABOLIC PANEL
BUN: 28 mg/dL — ABNORMAL HIGH (ref 6–23)
CO2: 22 mEq/L (ref 19–32)
Calcium: 9.4 mg/dL (ref 8.4–10.5)
Chloride: 111 mEq/L (ref 96–112)
Creat: 1.57 mg/dL — ABNORMAL HIGH (ref 0.50–1.10)
Glucose, Bld: 107 mg/dL — ABNORMAL HIGH (ref 70–99)
Potassium: 4.8 mEq/L (ref 3.5–5.3)
Sodium: 144 mEq/L (ref 135–145)

## 2011-08-29 NOTE — Progress Notes (Signed)
HPI:  Patient returns for routine postoperative follow-up having undergone a CABGx4 on 08/05/2011 by Dr. Tyrone Sage. She was last seen in the office 08/24/2011 for right lower extremity wound dehiscence and bloody drainage. She denies any fever or chills. She still has some shortness of breath with exertion.    Current Outpatient Prescriptions  Medication Sig Dispense Refill  . acetaminophen (TYLENOL) 325 MG tablet Take 650 mg by mouth every 6 (six) hours as needed.       Marland Kitchen allopurinol (ZYLOPRIM) 100 MG tablet Take 100 mg by mouth daily.       Marland Kitchen amiodarone (PACERONE) 400 MG tablet Take 200 mg by mouth daily.      Marland Kitchen aspirin 81 MG tablet Take 81 mg by mouth daily.        . colesevelam (WELCHOL) 625 MG tablet Take 3 tablets (1,875 mg total) by mouth 2 (two) times daily with a meal.  180 tablet  5  . ezetimibe (ZETIA) 10 MG tablet Take 1 tablet (10 mg total) by mouth daily.  30 tablet  5  . folic acid (FOLVITE) 1 MG tablet Take 1 tablet (1 mg total) by mouth daily.  30 tablet  1  . furosemide (LASIX) 20 MG tablet Take 40 mg by mouth daily.      Marland Kitchen levothyroxine (SYNTHROID, LEVOTHROID) 100 MCG tablet Take 100 mcg by mouth daily.        Marland Kitchen LORazepam (ATIVAN) 0.5 MG tablet Take 1 tablet (0.5 mg total) by mouth every 8 (eight) hours as needed.  30 tablet  0  . metoprolol tartrate (LOPRESSOR) 25 MG tablet Take 1 tablet (25 mg total) by mouth 2 (two) times daily.  60 tablet  1  . pantoprazole (PROTONIX) 40 MG tablet Take 1 tablet (40 mg total) by mouth daily at 12 noon.  30 tablet  5  . polysaccharide iron (NIFEREX) 150 MG CAPS capsule Take 1 capsule (150 mg total) by mouth daily.  30 each  0  . potassium chloride SA (K-DUR,KLOR-CON) 20 MEQ tablet Take 20 mEq by mouth every other day.      . rosuvastatin (CRESTOR) 5 MG tablet Take 1 tablet (5 mg total) by mouth daily at 6 PM.  30 tablet  1  . sodium bicarbonate 650 MG tablet Take 650 mg by mouth daily.        Vital Signs: BP 131/60, HR 64, RR 16, O2 Sat  96% on room air, and temperature 96.8.  Physical Exam: Right lower extremity wound: Slight serous sanguinous ooze, sutures all intact, there is 3+  lower extremity edema, and there is some erythema on the posterior side of the wound.    Impression and Plan: Her BMET done 2/13 showed her potassium to be 5.7. As a result, she was instructed to take no potassium supplement. In addition, Her creatinine was found to be elevated to 1.85. She was very edematous and was started on Lasix 40 mg po daily. I hope to be able to increase her diuretics; however, this will depend upon her be met results today. I will have her labs drawn again today. I avulsed sodium the patient a prescription for Keflex 500 mg by mouth 3 times daily for what now appears to be mild cellulitis. She was instructed to take this for 5 days. She has a followup appointment to see Dr. Tyrone Sage on 08/31/2011. Her daughter was instructed if  the patient develops any fever,  chills, increased drainage, or redness she is to contact  the office to be seen sooner.

## 2011-09-06 ENCOUNTER — Other Ambulatory Visit: Payer: Self-pay | Admitting: Cardiothoracic Surgery

## 2011-09-06 DIAGNOSIS — I2510 Atherosclerotic heart disease of native coronary artery without angina pectoris: Secondary | ICD-10-CM

## 2011-09-08 ENCOUNTER — Other Ambulatory Visit: Payer: Self-pay | Admitting: Physician Assistant

## 2011-09-08 ENCOUNTER — Ambulatory Visit (INDEPENDENT_AMBULATORY_CARE_PROVIDER_SITE_OTHER): Payer: Self-pay | Admitting: Cardiothoracic Surgery

## 2011-09-08 ENCOUNTER — Encounter: Payer: Self-pay | Admitting: Cardiothoracic Surgery

## 2011-09-08 ENCOUNTER — Ambulatory Visit
Admission: RE | Admit: 2011-09-08 | Discharge: 2011-09-08 | Disposition: A | Discharge status: Home / Self Care | Payer: Medicare Other | Source: Ambulatory Visit | Attending: Cardiothoracic Surgery | Admitting: Cardiothoracic Surgery

## 2011-09-08 VITALS — BP 146/92 | HR 80 | Resp 18 | Ht 62.0 in | Wt 212.0 lb

## 2011-09-08 DIAGNOSIS — I2510 Atherosclerotic heart disease of native coronary artery without angina pectoris: Secondary | ICD-10-CM

## 2011-09-08 DIAGNOSIS — Z951 Presence of aortocoronary bypass graft: Secondary | ICD-10-CM

## 2011-09-08 DIAGNOSIS — L0390 Cellulitis, unspecified: Secondary | ICD-10-CM

## 2011-09-08 DIAGNOSIS — L0291 Cutaneous abscess, unspecified: Secondary | ICD-10-CM

## 2011-09-08 DIAGNOSIS — T8132XA Disruption of internal operation (surgical) wound, not elsewhere classified, initial encounter: Secondary | ICD-10-CM

## 2011-09-08 NOTE — Progress Notes (Signed)
[address]            [place] [address]          [phone]      [facility] COPPA [facility] Medical Record #[id number] Date of Birth: 1920-09-20  Referring: Dr Herbie Baltimore Primary Care: Michiel Sites, MD, MD  Chief Complaint:   POST OP FOLLOW UP  History of Present Illness:     DATE OF PROCEDURE: 08/05/2011  DATE OF DISCHARGE:  OPERATIVE REPORT  PREOPERATIVE DIAGNOSIS: Unstable angina with left main coronary  obstruction and history of inferior myocardial infarction.  POSTOPERATIVE DIAGNOSIS: Unstable angina with left main coronary  obstruction and history of inferior myocardial infarction.  SURGICAL PROCEDURE: Coronary artery bypass grafting x4 with the left  internal mammary sequentially to the mid left anterior descending and  distal left anterior descending, reverse saphenous vein graft to the  circumflex coronary artery, reverse saphenous vein graft to the  posterior descending coronary artery, with bilateral thigh endovein  harvesting.   Patient has not been very active since discharge. Physical therapy has been seeing her at home. She does walk around in her home some but not much. She's been bothered by significant lower extremity edema. She's been on Lasix since discharge. He was increased to 80 mg twice a day yesterday. She's also had serous drainage from her right lower incision vein harvest site    Past Medical History  Diagnosis Date  . Diabetes mellitus   . Hypertension   . PONV (postoperative nausea and vomiting)   . Angina   . Shortness of breath   . Chronic kidney disease   . Hypothyroidism   . Headache   . Arthritis   . Anxiety   . Myocardial infarction   . Pneumonia   . Episodic lightheadedness, with near syncope X 3 07/30/2011  . Coronary artery disease   . 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 Paroxysmal atrial fibrillation, post CABG 08/10/2011  . Anemia  associated with acute blood loss, related to CABG 08/10/2011     History  Smoking status  . Never Smoker   Smokeless tobacco  . Not on file    History  Alcohol Use  . 0.6 oz/week  . 1 Glasses of wine per week     Allergies  Allergen Reactions  . Statins Rash    Current Outpatient Prescriptions  Medication Sig Dispense Refill  . acetaminophen (TYLENOL) 325 MG tablet Take 650 mg by mouth every 6 (six) hours as needed.       Marland Kitchen allopurinol (ZYLOPRIM) 100 MG tablet Take 100 mg by mouth daily.       Marland Kitchen amiodarone (PACERONE) 400 MG tablet Take 200 mg by mouth daily.      Marland Kitchen aspirin 81 MG tablet Take 81 mg by mouth daily.        . carvedilol (COREG) 6.25 MG tablet Take 6.25 mg by mouth 2 (two) times daily with a meal.      . colesevelam (WELCHOL) 625 MG tablet Take 3 tablets (1,875 mg total) by mouth 2 (two) times daily with a meal.  180 tablet  5  . ezetimibe (ZETIA) 10 MG tablet Take 1 tablet (10 mg total) by mouth daily.  30 tablet  5  . folic acid (FOLVITE) 1 MG tablet Take 1 tablet (1 mg  total) by mouth daily.  30 tablet  1  . furosemide (LASIX) 20 MG tablet Take 80 mg by mouth 2 (two) times daily.       Marland Kitchen glimepiride (AMARYL) 2 MG tablet Take 2 mg by mouth daily before breakfast.      . levothyroxine (SYNTHROID, LEVOTHROID) 100 MCG tablet Take 100 mcg by mouth daily.        Marland Kitchen LORazepam (ATIVAN) 0.5 MG tablet Take 1 tablet (0.5 mg total) by mouth every 8 (eight) hours as needed.  30 tablet  0  . pantoprazole (PROTONIX) 40 MG tablet Take 1 tablet (40 mg total) by mouth daily at 12 noon.  30 tablet  5  . polysaccharide iron (NIFEREX) 150 MG CAPS capsule Take 1 capsule (150 mg total) by mouth daily.  30 each  0  . sodium bicarbonate 650 MG tablet Take 650 mg by mouth daily.             Physical Exam: BP 146/92  Pulse 80  Resp 18  Ht 5\' 2"  (1.575 m)  Wt 212 lb (96.163 kg)  BMI 38.78 kg/m2  SpO2 90%  General appearance: alert, cooperative, appears stated age and no  distress Neurologic: intact Heart: regular rate and rhythm, S1, S2 normal, no murmur, click, rub or gallop Lungs: clear to auscultation bilaterally Abdomen: soft, non-tender; bowel sounds normal; no masses,  no organomegaly Extremities: edema Severe m edema both legs Wound: The area in the right proximal tibial area is healing poorly but does not appear to have active cellulitis the previously sutures were removed with the degree of edema she continues to weep lymph fluid from the incision Her sternal incision is stable and well healed   Diagnostic Studies & Laboratory data:     Recent Radiology Findings:   Dg Chest 2 View  09/08/2011  *RADIOLOGY REPORT*  Clinical Data: Cardiac surgery in January, follow-up, some shortness of breath  CHEST - 2 VIEW  Comparison: Chest x-ray of 08/15/2011  Findings: Aeration has improved minimally.  The lungs still not well aerated.  Basilar atelectasis and and a small left pleural effusion remain.  Cardiomegaly is stable.  Median sternotomy sutures are intact.  IMPRESSION: The lungs are not well aerated although aeration has improved minimally.  Basilar atelectasis and small left effusion remain.  Original Report Authenticated By: Juline Patch, M.D.      Recent Lab Findings: Lab Results  Component Value Date   WBC 10.2 08/12/2011   HGB 8.5* 08/12/2011   HCT 25.7* 08/12/2011   PLT 293 08/12/2011   GLUCOSE 107* 08/29/2011   CHOL 169 07/31/2011   TRIG 64 07/31/2011   HDL 68 07/31/2011   LDLCALC 88 07/31/2011   ALT 44* 07/30/2011   AST 39* 07/30/2011   NA 144 08/29/2011   K 4.8 08/29/2011   CL 111 08/29/2011   CREATININE 1.57* 08/29/2011   BUN 28* 08/29/2011   CO2 22 08/29/2011   TSH 1.618 07/30/2011   INR 1.72* 08/05/2011   HGBA1C 5.9* 07/30/2011      Assessment / Plan:     Considering her age of 23 years she is making reasonable progress after bypass surgery, the primary limitation is the degree of lower extremity edema and been able to diurese this and also her  overall activity level. She remains on oxygen at home in the office today at rest her O2 sats 90%.  She's improving but very slowly We'll see her back in 2 weeks for followup  wound check.        [name] MD  Beeper [phone] Office [phone] 09/08/2011 12:52 PM

## 2011-09-15 ENCOUNTER — Other Ambulatory Visit: Payer: Self-pay | Admitting: *Deleted

## 2011-09-15 MED ORDER — GLUCOSE BLOOD VI STRP: ORAL_STRIP | Status: DC

## 2011-09-20 ENCOUNTER — Other Ambulatory Visit: Payer: Self-pay | Admitting: Cardiothoracic Surgery

## 2011-09-20 ENCOUNTER — Encounter (HOSPITAL_COMMUNITY): Payer: Self-pay | Admitting: General Practice

## 2011-09-20 ENCOUNTER — Ambulatory Visit (INDEPENDENT_AMBULATORY_CARE_PROVIDER_SITE_OTHER): Payer: Medicare Other | Admitting: Thoracic Surgery (Cardiothoracic Vascular Surgery)

## 2011-09-20 ENCOUNTER — Inpatient Hospital Stay (HOSPITAL_COMMUNITY)
Admission: AD | Admit: 2011-09-20 | Discharge: 2011-09-27 | DRG: 603 | Disposition: A | Discharge status: Home Health | Payer: Medicare Other | Source: Ambulatory Visit | Attending: Thoracic Surgery (Cardiothoracic Vascular Surgery) | Admitting: Thoracic Surgery (Cardiothoracic Vascular Surgery)

## 2011-09-20 ENCOUNTER — Encounter: Payer: Self-pay | Admitting: Thoracic Surgery (Cardiothoracic Vascular Surgery)

## 2011-09-20 VITALS — BP 120/55 | HR 76 | Temp 97.4°F | Resp 16

## 2011-09-20 DIAGNOSIS — N289 Disorder of kidney and ureter, unspecified: Secondary | ICD-10-CM | POA: Diagnosis present

## 2011-09-20 DIAGNOSIS — I252 Old myocardial infarction: Secondary | ICD-10-CM

## 2011-09-20 DIAGNOSIS — E669 Obesity, unspecified: Secondary | ICD-10-CM | POA: Diagnosis present

## 2011-09-20 DIAGNOSIS — L02419 Cutaneous abscess of limb, unspecified: Principal | ICD-10-CM | POA: Diagnosis present

## 2011-09-20 DIAGNOSIS — IMO0002 Reserved for concepts with insufficient information to code with codable children: Secondary | ICD-10-CM

## 2011-09-20 DIAGNOSIS — L600 Ingrowing nail: Secondary | ICD-10-CM | POA: Diagnosis present

## 2011-09-20 DIAGNOSIS — E119 Type 2 diabetes mellitus without complications: Secondary | ICD-10-CM | POA: Diagnosis present

## 2011-09-20 DIAGNOSIS — F411 Generalized anxiety disorder: Secondary | ICD-10-CM | POA: Diagnosis present

## 2011-09-20 DIAGNOSIS — I2510 Atherosclerotic heart disease of native coronary artery without angina pectoris: Secondary | ICD-10-CM

## 2011-09-20 DIAGNOSIS — Z7982 Long term (current) use of aspirin: Secondary | ICD-10-CM

## 2011-09-20 DIAGNOSIS — Z79899 Other long term (current) drug therapy: Secondary | ICD-10-CM

## 2011-09-20 DIAGNOSIS — Z951 Presence of aortocoronary bypass graft: Secondary | ICD-10-CM

## 2011-09-20 DIAGNOSIS — T07XXXA Unspecified multiple injuries, initial encounter: Secondary | ICD-10-CM

## 2011-09-20 DIAGNOSIS — L03119 Cellulitis of unspecified part of limb: Principal | ICD-10-CM | POA: Diagnosis present

## 2011-09-20 DIAGNOSIS — Z09 Encounter for follow-up examination after completed treatment for conditions other than malignant neoplasm: Secondary | ICD-10-CM

## 2011-09-20 DIAGNOSIS — E785 Hyperlipidemia, unspecified: Secondary | ICD-10-CM | POA: Diagnosis present

## 2011-09-20 DIAGNOSIS — N189 Chronic kidney disease, unspecified: Secondary | ICD-10-CM | POA: Diagnosis present

## 2011-09-20 DIAGNOSIS — Z96649 Presence of unspecified artificial hip joint: Secondary | ICD-10-CM

## 2011-09-20 DIAGNOSIS — E039 Hypothyroidism, unspecified: Secondary | ICD-10-CM | POA: Diagnosis present

## 2011-09-20 DIAGNOSIS — I129 Hypertensive chronic kidney disease with stage 1 through stage 4 chronic kidney disease, or unspecified chronic kidney disease: Secondary | ICD-10-CM | POA: Diagnosis present

## 2011-09-20 LAB — BASIC METABOLIC PANEL
CO2: 30 mEq/L (ref 19–32)
Chloride: 101 mEq/L (ref 96–112)
GFR calc Af Amer: 32 mL/min — ABNORMAL LOW (ref >90)
Potassium: 3.7 mEq/L (ref 3.5–5.1)
Sodium: 140 mEq/L (ref 135–145)

## 2011-09-20 LAB — CBC
MCV: 78.2 fL (ref 78.0–100.0)
Platelets: 278 10*3/uL (ref 150–400)
RBC: 3.77 MIL/uL — ABNORMAL LOW (ref 3.87–5.11)
RDW: 16.4 % — ABNORMAL HIGH (ref 11.5–15.5)
WBC: 9.9 10*3/uL (ref 4.0–10.5)

## 2011-09-20 LAB — GLUCOSE, CAPILLARY
Glucose-Capillary: 108 mg/dL — ABNORMAL HIGH (ref 70–99)
Glucose-Capillary: 146 mg/dL — ABNORMAL HIGH (ref 70–99)

## 2011-09-20 LAB — MRSA PCR SCREENING: MRSA by PCR: NEGATIVE

## 2011-09-20 LAB — SURGICAL PCR SCREEN: MRSA, PCR: NEGATIVE

## 2011-09-20 MED ORDER — POLYSACCHARIDE IRON 150 MG PO CAPS
150.0000 mg | ORAL_CAPSULE | Freq: Every day | ORAL | Status: DC
  Administered 2011-09-20 – 2011-09-26 (×6): 150 mg via ORAL
  Filled 2011-09-20 (×13): qty 1

## 2011-09-20 MED ORDER — FOLIC ACID 1 MG PO TABS
1.0000 mg | ORAL_TABLET | Freq: Every day | ORAL | Status: DC
  Administered 2011-09-20 – 2011-09-27 (×7): 1 mg via ORAL
  Filled 2011-09-20 (×8): qty 1

## 2011-09-20 MED ORDER — EZETIMIBE 10 MG PO TABS
10.0000 mg | ORAL_TABLET | Freq: Every day | ORAL | Status: DC
  Administered 2011-09-20 – 2011-09-27 (×7): 10 mg via ORAL
  Filled 2011-09-20 (×8): qty 1

## 2011-09-20 MED ORDER — VANCOMYCIN HCL IN DEXTROSE 1-5 GM/200ML-% IV SOLN
1000.0000 mg | Freq: Once | INTRAVENOUS | Status: AC
  Administered 2011-09-20: 1000 mg via INTRAVENOUS
  Filled 2011-09-20: qty 200

## 2011-09-20 MED ORDER — ALLOPURINOL 100 MG PO TABS
100.0000 mg | ORAL_TABLET | Freq: Every day | ORAL | Status: DC
  Administered 2011-09-20 – 2011-09-27 (×7): 100 mg via ORAL
  Filled 2011-09-20 (×8): qty 1

## 2011-09-20 MED ORDER — ASPIRIN 81 MG PO TABS: 81.0000 mg | ORAL_TABLET | Freq: Every day | ORAL | Status: DC

## 2011-09-20 MED ORDER — OXYCODONE-ACETAMINOPHEN 5-325 MG PO TABS
1.0000 | ORAL_TABLET | ORAL | Status: DC | PRN
Start: 2011-09-20 — End: 2011-09-27
  Administered 2011-09-21: 1 via ORAL
  Filled 2011-09-20: qty 1

## 2011-09-20 MED ORDER — COLESEVELAM HCL 625 MG PO TABS
1875.0000 mg | ORAL_TABLET | Freq: Two times a day (BID) | ORAL | Status: DC
  Administered 2011-09-20 – 2011-09-27 (×12): 1875 mg via ORAL
  Filled 2011-09-20 (×18): qty 3

## 2011-09-20 MED ORDER — CARVEDILOL 6.25 MG PO TABS
6.2500 mg | ORAL_TABLET | Freq: Two times a day (BID) | ORAL | Status: DC
  Administered 2011-09-20 – 2011-09-27 (×13): 6.25 mg via ORAL
  Filled 2011-09-20 (×16): qty 1

## 2011-09-20 MED ORDER — INSULIN ASPART 100 UNIT/ML ~~LOC~~ SOLN
0.0000 [IU] | Freq: Three times a day (TID) | SUBCUTANEOUS | Status: DC
  Administered 2011-09-20: 2 [IU] via SUBCUTANEOUS
  Administered 2011-09-24: 3 [IU] via SUBCUTANEOUS

## 2011-09-20 MED ORDER — AMIODARONE HCL 200 MG PO TABS
200.0000 mg | ORAL_TABLET | Freq: Every day | ORAL | Status: DC
  Administered 2011-09-20 – 2011-09-27 (×7): 200 mg via ORAL
  Filled 2011-09-20 (×8): qty 1

## 2011-09-20 MED ORDER — VANCOMYCIN HCL 1000 MG IV SOLR
750.0000 mg | INTRAVENOUS | Status: DC
  Filled 2011-09-20 (×2): qty 750

## 2011-09-20 MED ORDER — ASPIRIN EC 81 MG PO TBEC
81.0000 mg | DELAYED_RELEASE_TABLET | Freq: Every day | ORAL | Status: DC
  Administered 2011-09-22 – 2011-09-27 (×6): 81 mg via ORAL
  Filled 2011-09-20 (×7): qty 1

## 2011-09-20 MED ORDER — SODIUM BICARBONATE 650 MG PO TABS
650.0000 mg | ORAL_TABLET | Freq: Every day | ORAL | Status: DC
  Administered 2011-09-20 – 2011-09-27 (×7): 650 mg via ORAL
  Filled 2011-09-20 (×8): qty 1

## 2011-09-20 MED ORDER — LEVOTHYROXINE SODIUM 100 MCG PO TABS
100.0000 ug | ORAL_TABLET | Freq: Every day | ORAL | Status: DC
  Administered 2011-09-22 – 2011-09-27 (×6): 100 ug via ORAL
  Filled 2011-09-20 (×8): qty 1

## 2011-09-20 MED ORDER — FUROSEMIDE 80 MG PO TABS
80.0000 mg | ORAL_TABLET | Freq: Two times a day (BID) | ORAL | Status: DC
  Administered 2011-09-20 – 2011-09-27 (×12): 80 mg via ORAL
  Filled 2011-09-20 (×16): qty 1

## 2011-09-20 MED ORDER — TRAMADOL HCL 50 MG PO TABS: 50.0000 mg | ORAL_TABLET | Freq: Four times a day (QID) | ORAL | Status: DC | PRN

## 2011-09-20 MED ORDER — PANTOPRAZOLE SODIUM 40 MG PO TBEC
40.0000 mg | DELAYED_RELEASE_TABLET | Freq: Every day | ORAL | Status: DC
  Administered 2011-09-20 – 2011-09-27 (×7): 40 mg via ORAL
  Filled 2011-09-20 (×6): qty 1

## 2011-09-20 MED ORDER — LORAZEPAM 0.5 MG PO TABS
0.5000 mg | ORAL_TABLET | Freq: Three times a day (TID) | ORAL | Status: DC | PRN
  Administered 2011-09-24 – 2011-09-26 (×4): 0.5 mg via ORAL
  Filled 2011-09-20 (×4): qty 1

## 2011-09-20 MED ORDER — ACETAMINOPHEN 325 MG PO TABS
650.0000 mg | ORAL_TABLET | Freq: Four times a day (QID) | ORAL | Status: DC | PRN
  Administered 2011-09-22 – 2011-09-23 (×2): 650 mg via ORAL
  Filled 2011-09-20 (×2): qty 2

## 2011-09-20 MED ORDER — GLIMEPIRIDE 2 MG PO TABS
2.0000 mg | ORAL_TABLET | Freq: Every day | ORAL | Status: DC
  Administered 2011-09-22 – 2011-09-26 (×5): 2 mg via ORAL
  Filled 2011-09-20 (×8): qty 1

## 2011-09-20 NOTE — Progress Notes (Addendum)
ANTIBIOTIC CONSULT NOTE - INITIAL  Pharmacy Consult for vancomycin Indication: Right thigh abscess/cellulitis  Allergies  Allergen Reactions  . Statins Rash   Medical History: Past Medical History  Diagnosis Date  . Diabetes mellitus   . Hypertension   . PONV (postoperative nausea and vomiting)   . Angina   . Shortness of breath   . Chronic kidney disease   . Hypothyroidism   . Headache   . Arthritis   . Anxiety   . Myocardial infarction   . Pneumonia   . Episodic lightheadedness, with near syncope X 3 07/30/2011  . Coronary artery disease   . 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 Paroxysmal atrial fibrillation, post CABG 08/10/2011  . Anemia associated with acute blood loss, related to CABG 08/10/2011    Medications:  Prescriptions prior to admission  Medication Sig Dispense Refill  . acetaminophen (TYLENOL) 325 MG tablet Take 650 mg by mouth every 6 (six) hours as needed. For pain      . allopurinol (ZYLOPRIM) 100 MG tablet Take 100 mg by mouth daily.       Marland Kitchen amiodarone (PACERONE) 400 MG tablet Take 200 mg by mouth daily.      Marland Kitchen aspirin 81 MG tablet Take 81 mg by mouth daily.        . carvedilol (COREG) 6.25 MG tablet Take 6.25 mg by mouth 2 (two) times daily with a meal.      . colesevelam (WELCHOL) 625 MG tablet Take 3 tablets (1,875 mg total) by mouth 2 (two) times daily with a meal.  180 tablet  5  . ezetimibe (ZETIA) 10 MG tablet Take 1 tablet (10 mg total) by mouth daily.  30 tablet  5  . folic acid (FOLVITE) 1 MG tablet Take 1 tablet (1 mg total) by mouth daily.  30 tablet  1  . furosemide (LASIX) 20 MG tablet Take 80 mg by mouth 2 (two) times daily.       Marland Kitchen levothyroxine (SYNTHROID, LEVOTHROID) 100 MCG tablet Take 100 mcg by mouth daily.        Marland Kitchen LORazepam (ATIVAN) 0.5 MG tablet Take 0.5 mg by mouth every 8 (eight) hours as needed. For anxiety      . pantoprazole (PROTONIX) 40 MG tablet Take 1 tablet (40 mg total) by mouth  daily at 12 noon.  30 tablet  5  . polysaccharide iron (NIFEREX) 150 MG CAPS capsule Take 150 mg by mouth daily.      . sodium bicarbonate 650 MG tablet Take 650 mg by mouth daily.        Marland Kitchen DISCONTD: LORazepam (ATIVAN) 0.5 MG tablet Take 1 tablet (0.5 mg total) by mouth every 8 (eight) hours as needed.  30 tablet  0  . DISCONTD: polysaccharide iron (NIFEREX) 150 MG CAPS capsule Take 1 capsule (150 mg total) by mouth daily.  30 each  0   Assessment: 78 year old female with history of coronary bypass grafting on January 25, presented to [facility] clinic for increasing pain, swelling and tenderness in her right thigh. Patient did not have any post-op complications and did not require extended use of antibiotics after surgery. Patient will be started on IV vancomycin and will likely need I&D of her right thigh. Baseline labs including renal function are currently pending. Will give Her 1gram of vanc now then follow her current scr for further dosing (scr 1.57 on 2/18).  Goal  of Therapy:  Vancomycin trough level 10-15 mcg/ml  Plan:  Vancomycin 1000mg  iv now Follow baseline labs  Severiano Gilbert 09/20/2011,2:53 PM  Addendum: Scr resulted to be 1.6 which appears to be around her baseline from previous lab draws. Calculated Crcl ~25 will give vancomycin 750mg  IV q 24 hours. Will likely need level soon given risk of accumulation with age and renal function.

## 2011-09-20 NOTE — Progress Notes (Signed)
Wrapped pt's dressing with kerlix.

## 2011-09-20 NOTE — H&P (Addendum)
H&P  Name: [name] DOB: 1920-08-28 78 y.o. MRN: [id number]  Chief Complaint: Right lower extremity cellulitis  HPI: [name] is a 78 y.o. female who presented to the [facility] office today for evaluation of her right leg wound. She is status post coronary artery bypass grafting x4 on 08/05/2011 by Dr. Tyrone Sage. She had endoscopic vein harvest from both thighs, and initially healed well. However upon followup to the office, she was noted to have some dehiscence of her right EVH site without infection. She was treated with local wound care including packing with saline wet-to-dry dressings twice a day. She has had significant lower extremity edema and has been on high-dose Lasix. She has noted mostly serous drainage from her incision site. Over the last several days, however, she has had increasing redness, swelling, and drainage from the right lower leg. She also noted a large "blister" at the [facility] site just above the knee. She denies any fevers or chills. She presented to the office today for further evaluation and was seen by Dr. Dorris Fetch, who felt she would require admission for right lower extremity cellulitis.   Past Medical History  Diagnosis Date  . Diabetes mellitus   . Hypertension   . PONV (postoperative nausea and vomiting)   . Angina   . Shortness of breath   . Chronic kidney disease   . Hypothyroidism   . Headache   . Arthritis   . Anxiety   . Myocardial infarction   . Pneumonia   . Episodic lightheadedness, with near syncope X 3 07/30/2011  . Coronary artery disease   . 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 Paroxysmal atrial fibrillation, post CABG 08/10/2011  . Anemia associated with acute blood loss, related to CABG 08/10/2011   Past Surgical History  Procedure Date  . Joint replacement     both hips replaced  . Cardiac catheterization   . Coronary angioplasty   . Coronary artery bypass graft 08/05/2011   Procedure: CORONARY ARTERY BYPASS GRAFTING (CABG);  Surgeon: [name], MD;  Location: [facility] OR;  Service: Open Heart Surgery;  Laterality: N/A;  coronary artery bypass graft times 4 using left internal mammary artery and right leg saphenous vein harvested endoscopically   History   Social History  . Marital Status: Married    Spouse Name: N/A    Number of Children: N/A  . Years of Education: N/A   Social History Main Topics  . Smoking status: Never Smoker   . Smokeless tobacco: Not on file  . Alcohol Use: 0.6 oz/week    1 Glasses of wine per week  . Drug Use:   . Sexually Active: No   Other Topics Concern  . Not on file   Social History Narrative  . No narrative on file   Family History  Problem Relation Age of Onset  . Breast cancer Mother   . Heart disease Father    Allergies  Allergen Reactions  . Statins Rash   Prior to Admission medications   Medication Sig Start Date End Date Taking? Authorizing Provider  acetaminophen (TYLENOL) 325 MG tablet Take 650 mg by mouth every 6 (six) hours as needed. For pain   Yes Historical Provider, MD  allopurinol (ZYLOPRIM) 100 MG tablet Take 100 mg by mouth daily.    Yes Historical Provider, MD  amiodarone (PACERONE) 400 MG tablet Take 200 mg by mouth daily. 08/14/11 08/13/12 Yes [name]  Margaretann Loveless, PA  aspirin 81 MG tablet Take 81 mg by mouth daily.     Yes Historical Provider, MD  carvedilol (COREG) 6.25 MG tablet Take 6.25 mg by mouth 2 (two) times daily with a meal.   Yes Historical Provider, MD  colesevelam (WELCHOL) 625 MG tablet Take 3 tablets (1,875 mg total) by mouth 2 (two) times daily with a meal. 08/15/11 08/14/12 Yes Dwana Melena, PA  ezetimibe (ZETIA) 10 MG tablet Take 1 tablet (10 mg total) by mouth daily. 08/15/11 08/14/12 Yes Dwana Melena, PA  folic acid (FOLVITE) 1 MG tablet Take 1 tablet (1 mg total) by mouth daily. 08/12/11 08/11/12 Yes Wilmon Pali, PA  furosemide (LASIX) 20 MG tablet Take 80 mg by mouth 2 (two) times  daily.  08/14/11 08/13/12 Yes [name] Margaretann Loveless, PA  levothyroxine (SYNTHROID, LEVOTHROID) 100 MCG tablet Take 100 mcg by mouth daily.     Yes Historical Provider, MD  LORazepam (ATIVAN) 0.5 MG tablet Take 0.5 mg by mouth every 8 (eight) hours as needed. For anxiety 08/25/11  Yes [name], MD  pantoprazole (PROTONIX) 40 MG tablet Take 1 tablet (40 mg total) by mouth daily at 12 noon. 06/01/11 05/31/12 Yes Abelino Derrick, PA  polysaccharide iron (NIFEREX) 150 MG CAPS capsule Take 150 mg by mouth daily. 08/12/11  Yes Wilmon Pali, PA  sodium bicarbonate 650 MG tablet Take 650 mg by mouth daily.     Yes Historical Provider, MD  Glimepiride 2 mg daily   Positive ROS:  Bilateral lower extremity edema, right greater than left. Drainage from right leg wound with redness and tenderness. Generalized weakness and anorexia. Some dyspnea on exertion. Limited mobility secondary to leg pain and swelling.  All other systems have been reviewed and were otherwise negative with the exception of those mentioned in the HPI and as above.  Physical Exam: Blood pressure 126/62 Pulse 71 Temperature 97 Respirations 17  General: Alert, no acute distress Cardiovascular: Regular rate and rhythm, no murmurs  Respiratory: Clear to auscultation.  GI: No organomegaly, abdomen is obese, soft and non-tender Neurologic: Alert and oriented.  No acute deficits. Extremities: +Significant lower extremity edema, right greater than left.  Right thigh warm with erythema, tenderness.  EVH wound above knee is open with purulent drainage.  The lower leg EVH site is open and draining serous fluid, although there is some superficial necrotic tissue.   Labs: Lab Results  Component Value Date   WBC 10.2 08/12/2011   HGB 8.5* 08/12/2011   HCT 25.7* 08/12/2011   MCV 81.8 08/12/2011   PLT 293 08/12/2011   Lab Results  Component Value Date   INR 1.72* 08/05/2011      Component Value Date/Time   NA 144 08/29/2011 1553   K 4.8  08/29/2011 1553   CL 111 08/29/2011 1553   CO2 22 08/29/2011 1553   GLUCOSE 107* 08/29/2011 1553   BUN 28* 08/29/2011 1553   CREATININE 1.57* 08/29/2011 1553   CREATININE 1.53* 08/15/2011 0640   CALCIUM 9.4 08/29/2011 1553   GFRNONAA 29* 08/15/2011 0640   GFRAA 34* 08/15/2011 0640    Imaging: Dg Chest 2 View  09/08/2011  *RADIOLOGY REPORT*  Clinical Data: Cardiac surgery in January, follow-up, some shortness of breath  CHEST - 2 VIEW  Comparison: Chest x-ray of 08/15/2011  Findings: Aeration has improved minimally.  The lungs still not well aerated.  Basilar atelectasis and and a small left pleural effusion remain.  Cardiomegaly is  stable.  Median sternotomy sutures are intact.  IMPRESSION: The lungs are not well aerated although aeration has improved minimally.  Basilar atelectasis and small left effusion remain.  Original Report Authenticated By: Juline Patch, M.D.     Assessment/Plan: This is an obese 78 year old diabetic female who is status post recent coronary artery bypass grafting. She has significant lower extremity edema and has had previously dehiscence of the right lower extremity EVH site. She now presents with frank cellulitis of the right thigh. There's also purulent drainage from the incision sites. She will be admitted for IV antibiotics and may ultimately require operative debridement.    Adella Hare, [place] 09/20/2011 1:58 PM   Patient seen and examined. Plan further opening of area of rt thigh infection in OR tomorrow. The goals risks and alternatives of the planned surgical procedure wound debridement  have been discussed with the patient in detail. The risks of the procedure including death, infection, stroke, myocardial infarction, bleeding, blood transfusion have all been discussed specifically.  I have quoted [name] a 1% of perioperative mortality and a complication rate as high as 10%. The patient's questions have been answered.[name] HARMONEY SIENKIEWICZ is willing  to  proceed with the planned procedure.   [name] MD  Beeper [phone] Office [phone]

## 2011-09-20 NOTE — Progress Notes (Signed)
  HPI:  [name] returns today to request of her home health nurse prior to her scheduled appointment for Thursday, March 14. She had coronary bypass grafting on January 25. She was seen by Dr. Tyrone Sage in the office on 09/08/2011 and was doing well at that point in time, although she did have some dehiscence of her saphenous vein harvest incision below the knee. She was receiving local wound care to that. Since then she's developed increasing pain, swelling, and tenderness in the right thigh. Her home health nurse had noted drainage and requested that she be seen sooner than her scheduled appointment.   Current Outpatient Prescriptions  Medication Sig Dispense Refill  . acetaminophen (TYLENOL) 325 MG tablet Take 650 mg by mouth every 6 (six) hours as needed.       Marland Kitchen allopurinol (ZYLOPRIM) 100 MG tablet Take 100 mg by mouth daily.       Marland Kitchen amiodarone (PACERONE) 400 MG tablet Take 200 mg by mouth daily.      Marland Kitchen aspirin 81 MG tablet Take 81 mg by mouth daily.        . carvedilol (COREG) 6.25 MG tablet Take 6.25 mg by mouth 2 (two) times daily with a meal.      . colesevelam (WELCHOL) 625 MG tablet Take 3 tablets (1,875 mg total) by mouth 2 (two) times daily with a meal.  180 tablet  5  . ezetimibe (ZETIA) 10 MG tablet Take 1 tablet (10 mg total) by mouth daily.  30 tablet  5  . folic acid (FOLVITE) 1 MG tablet Take 1 tablet (1 mg total) by mouth daily.  30 tablet  1  . furosemide (LASIX) 20 MG tablet Take 80 mg by mouth 2 (two) times daily.       Marland Kitchen glucose blood test strip Use as instructed  100 each  12  . levothyroxine (SYNTHROID, LEVOTHROID) 100 MCG tablet Take 100 mcg by mouth daily.        Marland Kitchen LORazepam (ATIVAN) 0.5 MG tablet Take 1 tablet (0.5 mg total) by mouth every 8 (eight) hours as needed.  30 tablet  0  . pantoprazole (PROTONIX) 40 MG tablet Take 1 tablet (40 mg total) by mouth daily at 12 noon.  30 tablet  5  . polysaccharide iron (NIFEREX) 150 MG CAPS capsule Take 1 capsule (150 mg  total) by mouth daily.  30 each  0  . sodium bicarbonate 650 MG tablet Take 650 mg by mouth daily.        Marland Kitchen glimepiride (AMARYL) 2 MG tablet Take 2 mg by mouth daily before breakfast.        Physical Exam BP 120/55  Pulse 76  Temp(Src) 97.4 F (36.3 C) (Oral)  Resp 16  SpO2 90% Right leg-marked induration, erythema and tenderness of right thigh, open incision with frank purulent drainage. Incision at right knee dehisieced no erythema at that site  Diagnostic Tests: None  Impression: 78 year old woman status post coronary bypass grafting who now has a severe infection in the right thigh with abscess and cellulitis. She needs hospitalization for wound care and intravenous antibiotics. In all likelihood she will require incision and drainage of the right thigh.   Plan: I will arrange admission. I will inform Dr. Tyrone Sage so that he can formulate an appropriate treatment plan.

## 2011-09-21 ENCOUNTER — Inpatient Hospital Stay (HOSPITAL_COMMUNITY): Payer: Medicare Other | Admitting: Anesthesiology

## 2011-09-21 ENCOUNTER — Encounter (HOSPITAL_COMMUNITY): Payer: Self-pay | Admitting: Anesthesiology

## 2011-09-21 ENCOUNTER — Encounter (HOSPITAL_COMMUNITY)
Admission: AD | Disposition: A | Discharge status: Home Health | Payer: Self-pay | Source: Ambulatory Visit | Attending: Thoracic Surgery (Cardiothoracic Vascular Surgery)

## 2011-09-21 DIAGNOSIS — L02419 Cutaneous abscess of limb, unspecified: Secondary | ICD-10-CM

## 2011-09-21 DIAGNOSIS — L03119 Cellulitis of unspecified part of limb: Secondary | ICD-10-CM

## 2011-09-21 HISTORY — PX: I & D EXTREMITY: SHX5045

## 2011-09-21 LAB — GLUCOSE, CAPILLARY
Glucose-Capillary: 116 mg/dL — ABNORMAL HIGH (ref 70–99)
Glucose-Capillary: 121 mg/dL — ABNORMAL HIGH (ref 70–99)
Glucose-Capillary: 181 mg/dL — ABNORMAL HIGH (ref 70–99)

## 2011-09-21 LAB — BASIC METABOLIC PANEL
Calcium: 9.5 mg/dL (ref 8.4–10.5)
GFR calc non Af Amer: 30 mL/min — ABNORMAL LOW (ref >90)
Glucose, Bld: 107 mg/dL — ABNORMAL HIGH (ref 70–99)
Sodium: 141 mEq/L (ref 135–145)

## 2011-09-21 SURGERY — IRRIGATION AND DEBRIDEMENT EXTREMITY
Anesthesia: General | Site: Thigh | Laterality: Right | Wound class: Dirty or Infected

## 2011-09-21 MED ORDER — PROPOFOL 10 MG/ML IV EMUL
INTRAVENOUS | Status: DC | PRN
  Administered 2011-09-21: 80 mg via INTRAVENOUS

## 2011-09-21 MED ORDER — FENTANYL CITRATE 0.05 MG/ML IJ SOLN
INTRAMUSCULAR | Status: DC | PRN
  Administered 2011-09-21 (×2): 25 ug via INTRAVENOUS

## 2011-09-21 MED ORDER — FENTANYL CITRATE 0.05 MG/ML IJ SOLN: 50.0000 ug | INTRAMUSCULAR | Status: DC | PRN

## 2011-09-21 MED ORDER — HYDROMORPHONE HCL PF 1 MG/ML IJ SOLN: 0.2500 mg | INTRAMUSCULAR | Status: DC | PRN

## 2011-09-21 MED ORDER — EPHEDRINE SULFATE 50 MG/ML IJ SOLN
INTRAMUSCULAR | Status: DC | PRN
  Administered 2011-09-21: 5 mg via INTRAVENOUS
  Administered 2011-09-21: 10 mg via INTRAVENOUS
  Administered 2011-09-21 (×3): 5 mg via INTRAVENOUS

## 2011-09-21 MED ORDER — ENOXAPARIN SODIUM 30 MG/0.3ML ~~LOC~~ SOLN
30.0000 mg | Freq: Every day | SUBCUTANEOUS | Status: DC
  Administered 2011-09-22 – 2011-09-26 (×5): 30 mg via SUBCUTANEOUS
  Filled 2011-09-21 (×6): qty 0.3

## 2011-09-21 MED ORDER — LACTATED RINGERS IV SOLN
INTRAVENOUS | Status: DC | PRN
  Administered 2011-09-21: 11:00:00 via INTRAVENOUS

## 2011-09-21 MED ORDER — LORAZEPAM 2 MG/ML IJ SOLN: 1.0000 mg | Freq: Once | INTRAMUSCULAR | Status: DC | PRN

## 2011-09-21 MED ORDER — MIDAZOLAM HCL 2 MG/2ML IJ SOLN: 1.0000 mg | INTRAMUSCULAR | Status: DC | PRN

## 2011-09-21 MED ORDER — HYDROMORPHONE HCL PF 1 MG/ML IJ SOLN
0.2500 mg | INTRAMUSCULAR | Status: DC | PRN
  Administered 2011-09-21: 0.25 mg via INTRAVENOUS

## 2011-09-21 MED ORDER — ONDANSETRON HCL 4 MG/2ML IJ SOLN
INTRAMUSCULAR | Status: DC | PRN
  Administered 2011-09-21: 4 mg via INTRAVENOUS

## 2011-09-21 MED ORDER — ONDANSETRON HCL 4 MG/2ML IJ SOLN
4.0000 mg | Freq: Four times a day (QID) | INTRAMUSCULAR | Status: DC | PRN
  Administered 2011-09-21: 4 mg via INTRAVENOUS
  Filled 2011-09-21: qty 2

## 2011-09-21 SURGICAL SUPPLY — 30 items
BANDAGE ELASTIC 4 VELCRO ST LF (GAUZE/BANDAGES/DRESSINGS) IMPLANT
BANDAGE ELASTIC 6 VELCRO ST LF (GAUZE/BANDAGES/DRESSINGS) IMPLANT
BANDAGE GAUZE ELAST BULKY 4 IN (GAUZE/BANDAGES/DRESSINGS) IMPLANT
CANISTER SUCTION 2500CC (MISCELLANEOUS) ×1 IMPLANT
CANISTER WOUND CARE 500ML ATS (WOUND CARE) ×1 IMPLANT
CLOTH BEACON ORANGE TIMEOUT ST (SAFETY) ×2 IMPLANT
COVER SURGICAL LIGHT HANDLE (MISCELLANEOUS) ×3 IMPLANT
DRSG VAC ATS SM SENSATRAC (GAUZE/BANDAGES/DRESSINGS) ×2 IMPLANT
ELECT REM PT RETURN 9FT ADLT (ELECTROSURGICAL) ×2
ELECTRODE REM PT RTRN 9FT ADLT (ELECTROSURGICAL) ×1 IMPLANT
FLUID NSS /IRRIG 3000 ML XXX (IV SOLUTION) ×1 IMPLANT
GLOVE BIO SURGEON STRL SZ 6.5 (GLOVE) ×2 IMPLANT
GOWN STRL NON-REIN LRG LVL3 (GOWN DISPOSABLE) ×5 IMPLANT
HANDPIECE INTERPULSE COAX TIP (DISPOSABLE) ×2
KIT BASIN OR (CUSTOM PROCEDURE TRAY) ×2 IMPLANT
KIT ROOM TURNOVER OR (KITS) ×2 IMPLANT
NS IRRIG 1000ML POUR BTL (IV SOLUTION) ×2 IMPLANT
PACK GENERAL/GYN (CUSTOM PROCEDURE TRAY) ×2 IMPLANT
PACK UNIVERSAL I (CUSTOM PROCEDURE TRAY) ×2 IMPLANT
PAD ARMBOARD 7.5X6 YLW CONV (MISCELLANEOUS) ×4 IMPLANT
SET HNDPC FAN SPRY TIP SCT (DISPOSABLE) IMPLANT
SPONGE GAUZE 4X4 12PLY (GAUZE/BANDAGES/DRESSINGS) ×1 IMPLANT
STAPLER VISISTAT 35W (STAPLE) IMPLANT
SUT VIC AB 2-0 CTX 36 (SUTURE) IMPLANT
SUT VIC AB 3-0 SH 27 (SUTURE)
SUT VIC AB 3-0 SH 27X BRD (SUTURE) IMPLANT
SUT VIC AB 3-0 X1 27 (SUTURE) ×1 IMPLANT
TOWEL OR 17X24 6PK STRL BLUE (TOWEL DISPOSABLE) ×2 IMPLANT
TOWEL OR 17X26 10 PK STRL BLUE (TOWEL DISPOSABLE) ×3 IMPLANT
WATER STERILE IRR 1000ML POUR (IV SOLUTION) ×1 IMPLANT

## 2011-09-21 NOTE — Transfer of Care (Signed)
Immediate Anesthesia Transfer of Care Note  Patient: [name]  Procedure(s) Performed: Procedure(s) (LRB): IRRIGATION AND DEBRIDEMENT EXTREMITY (Right)  Patient Location: PACU  Anesthesia Type: General  Level of Consciousness: awake, oriented and patient cooperative  Airway & Oxygen Therapy: Patient Spontanous Breathing and Patient connected to nasal cannula oxygen  Post-op Assessment: Report given to PACU RN and Post -op Vital signs reviewed and stable  Post vital signs: Reviewed  Complications: No apparent anesthesia complications

## 2011-09-21 NOTE — Progress Notes (Signed)
Report given to kay rn as caregiver 

## 2011-09-21 NOTE — Progress Notes (Signed)
UR Completed.  Fabiana Dromgoole Jane [phone] 09/21/2011  

## 2011-09-21 NOTE — Anesthesia Preprocedure Evaluation (Signed)
Anesthesia Evaluation  Patient identified by MRN, date of birth, ID band Patient awake    Reviewed: Allergy & Precautions, H&P , NPO status , Patient's Chart, lab work & pertinent test results  History of Anesthesia Complications (+) PONV  Airway Mallampati: II TM Distance: >3 FB Neck ROM: Full    Dental   Pulmonary shortness of breath,    Pulmonary exam normal       Cardiovascular hypertension, + angina + CAD and + Past MI + dysrhythmias     Neuro/Psych  Headaches, Anxiety    GI/Hepatic   Endo/Other  Diabetes mellitus-Hypothyroidism   Renal/GU      Musculoskeletal   Abdominal (+) + obese,   Peds  Hematology   Anesthesia Other Findings Leg abscess  Reproductive/Obstetrics                           Anesthesia Physical Anesthesia Plan  ASA: [name]  Anesthesia Plan: General   Post-op Pain Management:    Induction: Intravenous  Airway Management Planned: LMA  Additional Equipment:   Intra-op Plan:   Post-operative Plan: Extubation in OR  Informed Consent: I have reviewed the patients History and Physical, chart, labs and discussed the procedure including the risks, benefits and alternatives for the proposed anesthesia with the patient or authorized representative who has indicated his/her understanding and acceptance.     Plan Discussed with: CRNA and Surgeon  Anesthesia Plan Comments:         Anesthesia Quick Evaluation

## 2011-09-21 NOTE — Brief Op Note (Signed)
09/20/2011 - 09/21/2011  12:47 PM  PATIENT:  [name]  78 y.o. female  PRE-OPERATIVE DIAGNOSIS:  r LEG ABSCESS  POST-OPERATIVE DIAGNOSIS:  * No post-op diagnosis entered *  PROCEDURE:  Procedure(s) (LRB): IRRIGATION AND DEBRIDEMENT EXTREMITY (Right) Placement of VAC rt Leg SURGEON:  Surgeon(s) and Role:    * [name], MD - Primary    ANESTHESIA:   general  EBL:  Total I/O In: 400 [I.V.:400] Out: -   BLOOD ADMINISTERED:none  DRAINS: VAC wound device to leg wound x2     SPECIMEN:  Source of Specimen:  rt leg cultures  DISPOSITION OF SPECIMEN:  micro  COUNTS:  YES   DICTATION: .Other Dictation: Dictation Number   PLAN OF CARE: already inpatient retun to 2000, after PACU  PATIENT DISPOSITION:  PACU - hemodynamically stable.   Delay start of Pharmacological VTE agent (>24hrs) due to surgical blood loss or risk of bleeding: start lovenox

## 2011-09-21 NOTE — Preoperative (Signed)
Beta Blockers   Reason not to administer Beta Blockers:Not Applicable, pt took Coreg this am

## 2011-09-21 NOTE — Anesthesia Postprocedure Evaluation (Signed)
Anesthesia Post Note  Patient: [name]  Procedure(s) Performed: Procedure(s) (LRB): IRRIGATION AND DEBRIDEMENT EXTREMITY (Right)  Anesthesia type: general  Patient location: PACU  Post pain: Pain level controlled  Post assessment: Patient's Cardiovascular Status Stable  Last Vitals:  Filed Vitals:   09/21/11 1345  BP: 102/39  Pulse: 60  Temp:   Resp: 12    Post vital signs: Reviewed and stable  Level of consciousness: sedated  Complications: No apparent anesthesia complications

## 2011-09-21 NOTE — Progress Notes (Signed)
   CARE MANAGEMENT NOTE 09/21/2011  Patient:  [name], [name]   Account Number:  [id number]  Date Initiated:  09/21/2011  Documentation initiated by:  [facility]  Subjective/Objective Assessment:   cellulitis - lives with daughter. active with [facility]     Action/Plan:   PT S/P CABG IN JAN; SHE LIVES WITH DAUGHTER AND IS ACTIVE WITH [facility] FOR HOME HEALTH.   Anticipated DC Date:  09/26/2011   Anticipated DC Plan:  HOME W HOME HEALTH SERVICES      DC Planning Services  CM consult      [facility] Choice  HOME HEALTH   Choice offered to / List presented to:             [facility] agency  [facility]   Status of service:  In process, will continue to follow Medicare Important Message given?   (If response is "NO", the following Medicare IM given date fields will be blank) Date Medicare IM given:   Date Additional Medicare IM given:    Discharge Disposition:    Per UR Regulation:  Reviewed for med. necessity/level of care/duration of stay  If discussed at Long Length of Stay Meetings, dates discussed:    Comments:  09/20/11 Jillyn Stacey,RN,BSN 1430 MET WITH PT AND DAUGHTER.  PT STATES SHE HAS HOME O2 THROUGH [facility], WHICH SHE WEARS "MOST OF THE TIME".  SHE OWNS A BSC, WHEELCHAIR AND A TUB SEAT.  HHRN FOLLOWING AT HOME--WILL NEED RESUMPTION ORDRES FOR HOME HEALTH CARE PRIOR TO DC.  WILL NOTIFY [facility] OF HOSPITAL ADMISSION. Phone #[phone]

## 2011-09-22 ENCOUNTER — Ambulatory Visit: Payer: Self-pay | Admitting: Cardiothoracic Surgery

## 2011-09-22 DIAGNOSIS — L02419 Cutaneous abscess of limb, unspecified: Principal | ICD-10-CM | POA: Diagnosis present

## 2011-09-22 LAB — GLUCOSE, CAPILLARY
Glucose-Capillary: 143 mg/dL — ABNORMAL HIGH (ref 70–99)
Glucose-Capillary: 54 mg/dL — ABNORMAL LOW (ref 70–99)
Glucose-Capillary: 74 mg/dL (ref 70–99)

## 2011-09-22 MED ORDER — VANCOMYCIN HCL 1000 MG IV SOLR
750.0000 mg | INTRAVENOUS | Status: DC
  Administered 2011-09-22 – 2011-09-23 (×2): 750 mg via INTRAVENOUS
  Filled 2011-09-22 (×3): qty 750

## 2011-09-22 NOTE — Consult Note (Signed)
WOC consult noted on our list 09/21/11 for NPWT dressing changes every 3 days.  Placed in OR 09/21/11, will need first post op dressing change per surgeon or PA. Called to speak with bedside nurse, they are planning to change tom. Am. Bedside nurse comfortable with NPWT dressing change will perform without assistance from [facility] team.  Will sign off. Bedside nurse reports he will order supplies for dressing change as well. Reconsult if any further management of NPWT needed from our team.   Carmine Youngberg Eliberto Ivory RN, CWOCN [phone]

## 2011-09-22 NOTE — Op Note (Signed)
NAMEJILENE, [name]             ACCOUNT NO.:  [id number]  MEDICAL RECORD NO.:  [id number]  LOCATION:  2040                         FACILITY:  [facility]  PHYSICIAN:  Sheliah Plane, MD    DATE OF BIRTH:  1921/02/07  DATE OF PROCEDURE:  09/21/2011 DATE OF DISCHARGE:                              OPERATIVE REPORT   PREOPERATIVE DIAGNOSIS:  Cellulitis, right leg.  POSTOPERATIVE DIAGNOSIS:  Cellulitis, right leg.  SURGICAL PROCEDURE:  Drainage, irrigation and debridement of right leg infection and placement of a wound VAC with general anesthesia.  SURGEON:  Sheliah Plane, MD  BRIEF HISTORY:  The patient is an 78 year old female with significant obesity, especially involving her legs, who in January had undergone high-risk coronary artery bypass grafting for significant left main disease and high-grade right obstruction.  From a hemodynamic standpoint, she has done well, and was discharged home.  She returned to the office with cellulitis, rapidly progressing over several days, involving the right leg, probably starting at the endovein harvest site. She was admitted, started on IV antibiotics and was brought to the operating room after fasting to ensure an explore of the wound and ensure adequate drainage.  DESCRIPTION OF PROCEDURE:  With LMA to protect the patient's airway, she was anesthetized and the time-out was performed.  Right leg was prepped with Betadine, draped in sterile manner.  Cultures of 2 areas of open wound, 1 just above the knee and 1 just below the knee were cultured for aerobic and anaerobic cultures.  Each of these 2 incisions were more fully opened and the depth of the wounds were explored.  The previous endovein harvest tract going superiorly was completely healed and there did not appear to be any connection superiorly in the thigh, though the lower incision, the one just above the knee and just below the knee had some connection.  Each of these were  debrided and irrigated with the Pulsavac irrigator.  After irrigation, the wound VAC sponge was cut to appropriate size for each of the 2 incisions, and placed into the wound and connected to the wound VAC and secured in place.  The patient tolerated the procedure without complication with minimal blood loss. Sponge and needle count was reported as correct.  The patient was awakened in the operating room and transferred to the recovery room for postoperative care in a stable condition.     Sheliah Plane, MD     EG/MEDQ  D:  09/22/2011  T:  09/22/2011  Job:  [id number]

## 2011-09-22 NOTE — Progress Notes (Signed)
ANTIBIOTIC CONSULT NOTE - follow up Pharmacy Consult for vancomycin Indication: Right thigh abscess/cellulitis  Allergies  Allergen Reactions  . Statins Rash    Assessment: 77 year old female with history of coronary bypass grafting on January 25, presented to CVTS clinic for increasing pain, swelling and tenderness in her right thigh. Patient did not have any post-op complications and did not require extended use of antibiotics after surgery. Patient  started on IV vancomycin 3/12 at 1600, s/p  I&D of her right thigh and placement of wound vac 3/13. yest creat 1.5 with creat cl ~ 27 ml/min.  She did not get vancomycin dose last night. Goal of Therapy:  Vancomycin trough level 10-15 mcg/ml  Plan: change vancomycin administraton time 2nd to dose missed last PM. Continue vancomycin 750 mg IV q 24 hours.   [name] T 09/22/2011,8:38 AM  .

## 2011-09-22 NOTE — Progress Notes (Signed)
Physical Therapy Evaluation Patient Details Name: [name] MRN: [id number] DOB: [birth year] Today's Date: 09/22/2011  Problem List:  Patient Active Problem List  Diagnoses  . CAD, multiple vessel, severe disease, no stents placed only angioplasty to mid RCA.   Marland Kitchen Hypertension, uncontrolled on arrival 07/30/11  . Dyslipidemia  . Hypothyroidism  . Renal insufficiency  . Diabetes type 2, controlled  . History of gout  . Drug reaction  . Episodic lightheadedness, with near syncope X 3  . Racing heart beat, episodically  . Chest pain at rest, possible cardiac.  . S/P CABG x 3,LIMA - sequential prox-mid LAD, SVG-OM, SVG- RPDA, 08/05/11  . Paroxysmal atrial fibrillation, post CABG  . Anemia associated with acute blood loss, related to CABG    Past Medical History:  Past Medical History  Diagnosis Date  . Diabetes mellitus   . Hypertension   . PONV (postoperative nausea and vomiting)   . Angina   . Shortness of breath   . Chronic kidney disease   . Hypothyroidism   . Headache   . Arthritis   . Anxiety   . Myocardial infarction   . Pneumonia   . Episodic lightheadedness, with near syncope X 3 07/30/2011  . Coronary artery disease   . 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 Paroxysmal atrial fibrillation, post CABG 08/10/2011  . Anemia associated with acute blood loss, related to CABG 08/10/2011   Past Surgical History:  Past Surgical History  Procedure Date  . Cardiac catheterization   . Coronary angioplasty   . Coronary artery bypass graft 08/05/2011    Procedure: CORONARY ARTERY BYPASS GRAFTING (CABG);  Surgeon: [name], MD;  Location: [facility] OR;  Service: Open Heart Surgery;  Laterality: N/A;  coronary artery bypass graft times 4 using left internal mammary artery and right leg saphenous vein harvested endoscopically  . Joint replacement     both hips replaced    PT Assessment/Plan/Recommendation PT Assessment Clinical  Impression Statement: pt presents s/p I+D of R thigh Cellulitis.  pt with recent CABG in January, but notes this had been doing well.  pt now limited by pain and edema in R LE.  pt is eager for OOB and to return to home.   PT Recommendation/Assessment: Patient will need skilled PT in the acute care venue PT Problem List: Decreased strength;Decreased activity tolerance;Decreased balance;Decreased mobility;Decreased knowledge of use of DME;Pain Barriers to Discharge: Decreased caregiver support Barriers to Discharge Comments: ? How much A family can provide? PT Therapy Diagnosis : Difficulty walking;Generalized weakness PT Plan PT Frequency: Min 3X/week PT Treatment/Interventions: DME instruction;Gait training;Stair training;Functional mobility training;Therapeutic activities;Therapeutic exercise;Balance training;Patient/family education PT Recommendation Follow Up Recommendations: Home health PT;Supervision/Assistance - 24 hour;Skilled nursing facility (pending if family can provide 24hr) Equipment Recommended: None recommended by PT PT Goals  Acute Rehab PT Goals PT Goal Formulation: With patient Time For Goal Achievement: 2 weeks Pt will go Supine/Side to Sit: with modified independence PT Goal: Supine/Side to Sit - Progress: Goal set today Pt will go Sit to Supine/Side: with modified independence PT Goal: Sit to Supine/Side - Progress: Goal set today Pt will go Sit to Stand: with modified independence PT Goal: Sit to Stand - Progress: Goal set today Pt will go Stand to Sit: with modified independence PT Goal: Stand to Sit - Progress: Goal set today Pt will Ambulate: >150 feet;with modified independence;with rolling walker PT Goal: Ambulate - Progress: Goal set  today Pt will Go Up / Down Stairs: 3-5 stairs;with min assist;with rail(s) PT Goal: Up/Down Stairs - Progress: Goal set today  PT Evaluation Precautions/Restrictions  Precautions Precautions: Fall (Wound  Vac) Restrictions Weight Bearing Restrictions: No Prior Functioning  Home Living Lives With: Spouse;Daughter Receives Help From: Family Type of Home: House Home Layout: One level Home Access: Stairs to enter Entrance Stairs-Rails: Left Entrance Stairs-Number of Steps: 2 Bathroom Shower/Tub: Health visitor: Handicapped height Bathroom Accessibility: Yes How Accessible: Accessible via walker (Took door off and put curtain up.  ) Home Adaptive Equipment: Walker - rolling;Bedside commode/3-in-1;Shower chair with back;Reacher Prior Function Level of Independence: Needs assistance with ADLs;Needs assistance with homemaking;Requires assistive device for independence;Independent with transfers;Independent with gait Able to Take Stairs?: Yes Driving: No Vocation: Retired Designer, television/film set Level: Oriented X4 Sensation/Coordination   Extremity Assessment RLE Assessment RLE Assessment: Exceptions to [facility] RLE Strength RLE Overall Strength Comments: Limited by edema and wound vac on R knne and thigh.  pt with WFL AROM and strength >3/5, but not formally tested secondary to pain.   LLE Assessment LLE Assessment: Within Functional Limits Mobility (including Balance) Bed Mobility Bed Mobility: Yes Supine to Sit: 5: Supervision;With rails Supine to Sit Details (indicate cue type and reason): pt needs increased time and uses rail to complete.  pt notes she has been able to bring her R LE OOB, but has been struggling when returning to bed.   Sitting - Scoot to Edge of Bed: 6: Modified independent (Device/Increase time) Transfers Transfers: Yes Sit to Stand: 4: Min assist;With upper extremity assist;From bed Sit to Stand Details (indicate cue type and reason): pt demos good technique.  Tends to lean L to minimize WBing on R LE.  pt notes feeling dizzy, but notes it eases after she stands for a minute.   Stand to Sit: 5: Supervision;With upper extremity assist;To  chair/3-in-1 Stand to Sit Details: cues to get closer to chair and control descent Ambulation/Gait Ambulation/Gait: Yes Ambulation/Gait Assistance: 4: Min assist Ambulation/Gait Assistance Details (indicate cue type and reason): cues for upright posture, use of RW, deep breathing.   Ambulation Distance (Feet): 20 Feet Assistive device: Rolling walker Gait Pattern: Trunk flexed;Decreased stride length Stairs: No Wheelchair Mobility Wheelchair Mobility: No    Exercise    End of Session PT - End of Session Equipment Utilized During Treatment: Gait belt Activity Tolerance: Patient limited by fatigue;Patient limited by pain Patient left: in chair;with call bell in reach Nurse Communication: Mobility status for ambulation;Mobility status for transfers General Behavior During Session: [facility] for tasks performed Cognition: [facility] for tasks performed  Sunny Schlein, [place] [phone] 09/22/2011, 12:44 PM

## 2011-09-22 NOTE — Progress Notes (Addendum)
1 Day Post-Op Procedure(s) (LRB): IRRIGATION AND DEBRIDEMENT EXTREMITY (Right)  Subjective: Patient with some shortness of breath upon exertion and left great toe pain.  Objective: Vital signs in last 24 hours: Patient Vitals for the past 24 hrs:  BP Temp Temp src Pulse Resp SpO2 Weight  09/22/11 0422 133/63 mmHg 97.6 F (36.4 C) Oral 71  20  99 % 203 lb 6.4 oz (92.262 kg)  09/21/11 2009 128/68 mmHg 97 F (36.1 C) Oral 66  20  98 % -  09/21/11 1446 123/69 mmHg 97.5 F (36.4 C) - 63  16  98 % -  09/21/11 1415 116/41 mmHg 97.6 F (36.4 C) - 60  11  99 % -  09/21/11 1400 114/38 mmHg - - 60  11  99 % -  09/21/11 1345 102/39 mmHg - - 60  12  99 % -  09/21/11 1330 117/43 mmHg - - 61  13  99 % -  09/21/11 1315 115/42 mmHg - - 62  12  99 % -  09/21/11 1300 117/44 mmHg - - 62  12  98 % -  09/21/11 1240 130/55 mmHg 97.2 F (36.2 C) - 67  12  98 % -  09/21/11 1014 153/77 mmHg 97.6 F (36.4 C) Oral 73  18  97 % -    Current Weight  09/22/11 203 lb 6.4 oz (92.262 kg)       Intake/Output from previous day: 03/13 0701 - 03/14 0700 In: 880 [P.O.:480; I.V.:400] Out: 100 [Urine:100]   Physical Exam:  Cardiovascular: RRR, no murmurs, gallops, or rubs. Pulmonary: Diminished at bases; no rales, wheezes, or rhonchi. Abdomen: Soft, non tender, bowel sounds present. Extremities: Bilateral lower extremity edema. Wounds: Vacs in place.  Lab Results: CBC: Basename 09/20/11 1441  WBC 9.9  HGB 9.4*  HCT 29.5*  PLT 278   BMET:  Basename 09/21/11 0450 09/20/11 1441  NA 141 140  K 3.5 3.7  CL 102 101  CO2 33* 30  GLUCOSE 107* 102*  BUN 24* 27*  CREATININE 1.50* 1.60*  CALCIUM 9.5 9.7    PT/INR: No results found for this basename: LABPROT,INR in the last 72 hours ABG:  INR: Will add last result for INR, ABG once components are confirmed Will add last 4 CBG results once components are confirmed  Assessment/Plan:  1.S/p drainage, irrigation, debridement and placement of wound  vac for right thigh infection.Change wound vac in am.Continue Vancomycin. 2.Patient has a podiatrist and his name is Dr. Peggye Fothergill. She had an appointment to see him, but got hospitalized.She appears to have an ingrown toenail. No signs of gout.Will find out if he has hospital privileges.  [name],[name] 09/22/2011   I have seen and examined [name] and agree with the above assessment  and plan.  [name] MD Beeper [phone] Office [phone] 09/22/2011 5:54 PM

## 2011-09-23 LAB — BASIC METABOLIC PANEL
BUN: 19 mg/dL (ref 6–23)
Chloride: 100 mEq/L (ref 96–112)
Glucose, Bld: 85 mg/dL (ref 70–99)
Potassium: 3.9 mEq/L (ref 3.5–5.1)

## 2011-09-23 LAB — CULTURE, ROUTINE-ABSCESS: Gram Stain: NONE SEEN

## 2011-09-23 LAB — GLUCOSE, CAPILLARY
Glucose-Capillary: 70 mg/dL (ref 70–99)
Glucose-Capillary: 91 mg/dL (ref 70–99)
Glucose-Capillary: 87 mg/dL (ref 70–99)

## 2011-09-23 MED ORDER — CIPROFLOXACIN HCL 500 MG PO TABS
500.0000 mg | ORAL_TABLET | Freq: Two times a day (BID) | ORAL | Status: DC
  Administered 2011-09-23 – 2011-09-27 (×8): 500 mg via ORAL
  Filled 2011-09-23 (×12): qty 1

## 2011-09-23 MED ORDER — POVIDONE-IODINE 10 % EX SOLN
Freq: Two times a day (BID) | CUTANEOUS | Status: DC
  Administered 2011-09-23: 1 via TOPICAL
  Administered 2011-09-24 – 2011-09-26 (×5): via TOPICAL
  Filled 2011-09-23: qty 237

## 2011-09-23 NOTE — Progress Notes (Signed)
   CARE MANAGEMENT NOTE 09/23/2011  Patient:  [name], [name]   Account Number:  [id number]  Date Initiated:  09/21/2011  Documentation initiated by:  [facility]  Subjective/Objective Assessment:   cellulitis - lives with daughter. active with [facility]     Action/Plan:   PT S/P CABG IN JAN; SHE LIVES WITH DAUGHTER AND IS ACTIVE WITH [facility] FOR HOME HEALTH.   Anticipated DC Date:  09/26/2011   Anticipated DC Plan:  HOME W HOME HEALTH SERVICES      DC Planning Services  CM consult      [facility] Choice  HOME HEALTH   Choice offered to / List presented to:             [facility] agency  [facility]   Status of service:  In process, will continue to follow Medicare Important Message given?   (If response is "NO", the following Medicare IM given date fields will be blank) Date Medicare IM given:   Date Additional Medicare IM given:    Discharge Disposition:    Per UR Regulation:  Reviewed for med. necessity/level of care/duration of stay  If discussed at Long Length of Stay Meetings, dates discussed:    Comments:  09/23/11 [name] Buhl,RN,BSN 1200 PT HAD WOUND VAC PLACED ON 3/13...MAY NEED HOME VAC.  WILL COMPLETE WOUND VAC INSURANCE AUTH APPLICATION.  WILL NEED MD SIGNATURE ON VAC APPLICATION, AND THEN FAX TO [employer].  WILL CONT TO FOLLOW. Phone #[phone]   09/20/11 [name] Bracken,RN,BSN 1430 MET WITH PT AND DAUGHTER.  PT STATES SHE HAS HOME O2 THROUGH [facility], WHICH SHE WEARS "MOST OF THE TIME".  SHE OWNS A BSC, WHEELCHAIR AND A TUB SEAT.  HHRN FOLLOWING AT HOME--WILL NEED RESUMPTION ORDRES FOR HOME HEALTH CARE PRIOR TO DC.  WILL NOTIFY [facility] OF HOSPITAL ADMISSION. Phone #[phone]

## 2011-09-23 NOTE — Progress Notes (Addendum)
2 Days Post-Op Procedure(s) (LRB): IRRIGATION AND DEBRIDEMENT EXTREMITY (Right)  Subjective: Patient with some shortness of breath upon exertion and left great toe pain.  Objective: Vital signs in last 24 hours: Patient Vitals for the past 24 hrs:  BP Temp Temp src Pulse Resp SpO2 Weight  09/23/11 0540 119/62 mmHg 97.1 F (36.2 C) Oral 96  20  97 % 204 lb 5.9 oz (92.7 kg)  09/22/11 2035 150/78 mmHg 97.8 F (36.6 C) Oral 75  18  100 % -  09/22/11 1330 135/65 mmHg 97.5 F (36.4 C) Oral 75  18  97 % -    Current Weight  09/23/11 204 lb 5.9 oz (92.7 kg)      Intake/Output from previous day: 03/14 0701 - 03/15 0700 In: 840 [P.O.:840] Out: 970 [Urine:950; Drains:20]   Physical Exam:  Cardiovascular: RRR, no murmurs, gallops, or rubs. Pulmonary: Diminished at bases; no rales, wheezes, or rhonchi. Abdomen: Soft, non tender, bowel sounds present. Extremities: Bilateral lower extremity edema. Wounds: Vacs in place.Removed this am. Wounds are clean and dry.There is some granulation present.  Lab Results: CBC:  Basename 09/20/11 1441  WBC 9.9  HGB 9.4*  HCT 29.5*  PLT 278   BMET:   Basename 09/23/11 0500 09/21/11 0450  NA 139 141  K 3.9 3.5  CL 100 102  CO2 32 33*  GLUCOSE 85 107*  BUN 19 24*  CREATININE 1.43* 1.50*  CALCIUM 9.4 9.5    PT/INR: No results found for this basename: LABPROT,INR in the last 72 hours ABG:  INR: Will add last result for INR, ABG once components are confirmed Will add last 4 CBG results once components are confirmed  Assessment/Plan:  1.S/p drainage, irrigation, debridement and placement of wound vac for right thigh infection. Continue Vancomycin. 2.Patient has a podiatrist and his name is Dr. Peggye Fothergill. She had an appointment to see him, but got hospitalized.She appears to have an ingrown toenail. No signs of gout.I spoke with his office yesterday.We will do betadine warm soaks bid and a neosporin band aid.She will follow up with Dr.  Peggye Fothergill after discharge. 3.Creatnine down to 1.43.  [name],[name] 09/23/2011    09/23/2011 9:05 AM   Cultures of wound growing proteus s to cipro will dc vancomycin and star cipro.  Prob change vac on Monday check wound and poss home  I have seen and examined [name] and agree with the above assessment  and plan.  [name] MD Beeper [phone] Office [phone] 09/23/2011 1:54 PM

## 2011-09-23 NOTE — Progress Notes (Signed)
Inpatient Diabetes Program Recommendations  AACE/ADA: New Consensus Statement on Inpatient Glycemic Control (2009)  Target Ranges:  Prepandial:   less than 140 mg/dL      Peak postprandial:   less than 180 mg/dL (1-2 hours)      Critically ill patients:  140 - 180 mg/dL   Reason for Visit: Note hypoglycemic event last PM.  Inpatient Diabetes Program Recommendations Correction (SSI): Decrease Novolog coverage to sensitive tid wc. Oral Agents: Consider holding Amaryl due to hypoglycemia while in the hospital.  Note: Will follow.

## 2011-09-24 LAB — GLUCOSE, CAPILLARY
Glucose-Capillary: 61 mg/dL — ABNORMAL LOW (ref 70–99)
Glucose-Capillary: 182 mg/dL — ABNORMAL HIGH (ref 70–99)
Glucose-Capillary: 36 mg/dL — CL (ref 70–99)
Glucose-Capillary: 92 mg/dL (ref 70–99)

## 2011-09-24 LAB — CULTURE, ROUTINE-ABSCESS: Gram Stain: NONE SEEN

## 2011-09-24 MED ORDER — CIPROFLOXACIN HCL 500 MG PO TABS: 500.0000 mg | ORAL_TABLET | Freq: Two times a day (BID) | ORAL | Status: AC

## 2011-09-24 MED ORDER — GLUCOSE 40 % PO GEL
ORAL | Status: AC
  Administered 2011-09-24: 37.5 g
  Filled 2011-09-24: qty 1

## 2011-09-24 MED ORDER — INSULIN ASPART 100 UNIT/ML ~~LOC~~ SOLN: 0.0000 [IU] | Freq: Three times a day (TID) | SUBCUTANEOUS | Status: DC

## 2011-09-24 MED ORDER — GLIMEPIRIDE 2 MG PO TABS: 2.0000 mg | ORAL_TABLET | Freq: Every day | ORAL | Status: DC

## 2011-09-24 MED ORDER — TRAMADOL HCL 50 MG PO TABS: 50.0000 mg | ORAL_TABLET | Freq: Four times a day (QID) | ORAL | Status: AC | PRN

## 2011-09-24 MED ORDER — LACTULOSE 10 GM/15ML PO SOLN
20.0000 g | Freq: Once | ORAL | Status: AC
  Administered 2011-09-24: 20 g via ORAL
  Filled 2011-09-24: qty 30

## 2011-09-24 NOTE — Progress Notes (Signed)
Patient ID: [name], female   DOB: November 08, 1920, 78 y.o.   MRN: [id number] Notified of low blood sugar Had similar episode yesterday about the same time, both days followed high value at noon.  Will decrease SSI to sensitive scale

## 2011-09-24 NOTE — Progress Notes (Addendum)
CBG: 36  Treatment: 1 tube instant glucose and 15 GM carbohydrate snack  Symptoms: None  Follow-up CBG: Time:1701 CBG Result:84  Possible Reasons for Event: Unknown  Comments/MD notified: SSI changed to sensitive    [name], Alene Mires

## 2011-09-24 NOTE — Progress Notes (Addendum)
3 Days Post-Op Procedure(s) (LRB): IRRIGATION AND DEBRIDEMENT EXTREMITY (Right)  Subjective:  Patient states left great toe pain is less with soaks.  Objective: Vital signs in last 24 hours: Patient Vitals for the past 24 hrs:  BP Temp Temp src Pulse Resp SpO2 Weight  09/24/11 0513 136/69 mmHg 97.6 F (36.4 C) Oral 66  19  97 % 203 lb 4.8 oz (92.216 kg)  09/23/11 2032 132/65 mmHg 97.2 F (36.2 C) Oral 68  20  98 % -  09/23/11 1345 132/56 mmHg 97.9 F (36.6 C) Oral 65  18  98 % -    Current Weight  09/24/11 203 lb 4.8 oz (92.216 kg)      Intake/Output from previous day: 03/15 0701 - 03/16 0700 In: 1200 [P.O.:1200] Out: 600 [Urine:600]   Physical Exam:  Cardiovascular: RRR, no murmurs, gallops, or rubs. Pulmonary: Diminished at bases; no rales, wheezes, or rhonchi. Abdomen: Soft, non tender, bowel sounds present. Extremities: Bilateral lower extremity edema. Wounds: Vacs in place.  Lab Results: CBC: No results found for this basename: WBC:2,HGB:2,HCT:2,PLT:2 in the last 72 hours BMET:   Basename 09/23/11 0500  NA 139  K 3.9  CL 100  CO2 32  GLUCOSE 85  BUN 19  CREATININE 1.43*  CALCIUM 9.4    PT/INR: No results found for this basename: LABPROT,INR in the last 72 hours ABG:  INR: Will add last result for INR, ABG once components are confirmed Will add last 4 CBG results once components are confirmed  Assessment/Plan:  1.S/p drainage, irrigation, debridement and placement of wound vac for right thigh infection. Continue Cipro for Proteus.Wound vac to be changed Monday. 2.Patient has a podiatrist and his name is Dr. Peggye Fothergill. She had an appointment to see him, but got hospitalized.She appears to have an ingrown toenail. No signs of gout. We will do betadine warm soaks bid and a neosporin band aid.She will follow up with Dr. Peggye Fothergill after discharge. 3.Creatnine down to 1.43.  Nela Bascom MPA-C 09/24/2011    09/24/2011 9:13 AM

## 2011-09-24 NOTE — Progress Notes (Signed)
Physician Discharge Summary  Patient ID: [name] MRN: [id number] DOB/AGE: [birth year] 78 y.o.  Admit date: 09/20/2011 Discharge date: 09/25/2011  Admission Diagnoses: 1.Cellulitis of the right leg 2.S/p CABGx4 08/05/2011 (s/p MI) 3.History of hypertension 4.History of dyslipidemia 5.History of DM 6.History of acute on chronic renal insufficiency 7.History of hypothyroidism  Discharge Diagnoses:  1.Cellulitis of the right leg 2.S/p CABGx4 08/05/2011 (s/p MI) 3.History of hypertension 4.History of dyslipidemia 5.History of DM 6.History of acute on chronic renal insufficiency 7.History of hypothyroidism  Procedure (s):  Drainage, irrigation and debridement of right leg  infection and placement of a wound VAC with general anesthesia by Dr. Tyrone Sage on 09/22/2011.  History of Presenting Illness: This is a 78 y.o. African American female who presented to the [facility] office 09/20/2011  for evaluation of her right leg wound. She is status post coronary artery bypass grafting x4 on 08/05/2011 by Dr. Tyrone Sage. She had endoscopic vein harvest from both thighs that had initially healed well. However upon followup to the office, she was noted to have some dehiscence of her right EVH site without infection. She was treated with local wound care including packing with saline wet-to-dry dressings twice a day. She has had significant lower extremity edema and has been on high-dose Lasix. She has noted mostly serous drainage from her incision site. Over the last several days, however, she has had increasing redness, swelling, and drainage from the right lower leg. She also noted a large "blister" at the [facility] site just above the knee. She denies any fevers or chills. She presented to the office today for further evaluation and was seen by Dr. Dorris Fetch, who felt she would require admission for right lower extremity cellulitis.   Brief Hospital Course:  She has remained afebrile and hemodynamically  stable. She was initially placed on Vancomycin;however, culture that was done in the operating room showed Proteus Mirabilis. Vancomycin was discontinued and she was started on Cipro. Her wound vac was changed on Friday 09/23/2011.Her wound was clean and there was granulation present.Her last creatinine was down to 1.43.She is going to be discharged on Monday 09/26/2011 after her wound vac has been changed.   Filed Vitals:   09/24/11 0513  BP: 136/69  Pulse: 66  Temp: 97.6 F (36.4 C)  Resp: 19     Latest Vital Signs: Blood pressure 136/69, pulse 66, temperature 97.6 F (36.4 C), temperature source Oral, resp. rate 19, height 5\' 2"  (1.575 m), weight 203 lb 4.8 oz (92.216 kg), SpO2 97.00%.  Physical Exam: Cardiovascular: RRR, no murmurs, gallops, or rubs.  Pulmonary: Diminished at bases; no rales, wheezes, or rhonchi.  Abdomen: Soft, non tender, bowel sounds present.  Extremities: Bilateral lower extremity edema.  Wounds: Vacs in place.   Discharge Condition:Stable  Recent laboratory studies:  Lab Results  Component Value Date   WBC 9.9 09/20/2011   HGB 9.4* 09/20/2011   HCT 29.5* 09/20/2011   MCV 78.2 09/20/2011   PLT 278 09/20/2011   Lab Results  Component Value Date   NA 139 09/23/2011   K 3.9 09/23/2011   CL 100 09/23/2011   CO2 32 09/23/2011   CREATININE 1.43* 09/23/2011   GLUCOSE 85 09/23/2011      Diagnostic Studies: Dg Chest 2 View  09/08/2011  *RADIOLOGY REPORT*  Clinical Data: Cardiac surgery in January, follow-up, some shortness of breath  CHEST - 2 VIEW  Comparison: Chest x-ray of 08/15/2011  Findings: Aeration has improved minimally.  The lungs still not well aerated.  Basilar atelectasis and and a small left pleural effusion remain.  Cardiomegaly is stable.  Median sternotomy sutures are intact.  IMPRESSION: The lungs are not well aerated although aeration has improved minimally.  Basilar atelectasis and small left effusion remain.  Original Report Authenticated By: Juline Patch, M.D.    Discharge Medications: Medication List  As of 09/24/2011 11:06 AM   TAKE these medications         acetaminophen 325 MG tablet   Commonly known as: TYLENOL   Take 650 mg by mouth every 6 (six) hours as needed. For pain      allopurinol 100 MG tablet   Commonly known as: ZYLOPRIM   Take 100 mg by mouth daily.      amiodarone 400 MG tablet   Commonly known as: PACERONE   Take 200 mg by mouth daily.      aspirin 81 MG tablet   Take 81 mg by mouth daily.      carvedilol 6.25 MG tablet   Commonly known as: COREG   Take 6.25 mg by mouth 2 (two) times daily with a meal.      ciprofloxacin 500 MG tablet   Commonly known as: CIPRO   Take 1 tablet (500 mg total) by mouth 2 (two) times daily. For 10 days then stop.      colesevelam 625 MG tablet   Commonly known as: WELCHOL   Take 3 tablets (1,875 mg total) by mouth 2 (two) times daily with a meal.      ezetimibe 10 MG tablet   Commonly known as: ZETIA   Take 1 tablet (10 mg total) by mouth daily.      folic acid 1 MG tablet   Commonly known as: FOLVITE   Take 1 tablet (1 mg total) by mouth daily.      furosemide 20 MG tablet   Commonly known as: LASIX   Take 80 mg by mouth 2 (two) times daily.      glimepiride 2 MG tablet   Commonly known as: AMARYL   Take 1 tablet (2 mg total) by mouth daily with breakfast.      levothyroxine 100 MCG tablet   Commonly known as: SYNTHROID, LEVOTHROID   Take 100 mcg by mouth daily.      LORazepam 0.5 MG tablet   Commonly known as: ATIVAN   Take 0.5 mg by mouth every 8 (eight) hours as needed. For anxiety      pantoprazole 40 MG tablet   Commonly known as: PROTONIX   Take 1 tablet (40 mg total) by mouth daily at 12 noon.      polysaccharide iron 150 MG Caps capsule   Commonly known as: NIFEREX   Take 150 mg by mouth daily.      sodium bicarbonate 650 MG tablet   Take 650 mg by mouth daily.      traMADol 50 MG tablet   Commonly known as: ULTRAM   Take 1  tablet (50 mg total) by mouth every 6 (six) hours as needed for pain.            Follow Up Appointments: Follow-up Information    Follow up with GERHARDT,EDWARD B, MD. (Office will call you with an appointment date and time)    Contact information:   [address] [place] [address] [phone]          Signed: Doree Fudge MPA-C 09/24/2011, 11:06 AM

## 2011-09-24 NOTE — Discharge Instructions (Signed)
Home Health with [facility] RN, physical therapy

## 2011-09-25 LAB — GLUCOSE, CAPILLARY
Glucose-Capillary: 103 mg/dL — ABNORMAL HIGH (ref 70–99)
Glucose-Capillary: 122 mg/dL — ABNORMAL HIGH (ref 70–99)
Glucose-Capillary: 169 mg/dL — ABNORMAL HIGH (ref 70–99)
Glucose-Capillary: 66 mg/dL — ABNORMAL LOW (ref 70–99)

## 2011-09-25 MED ORDER — ZOLPIDEM TARTRATE 5 MG PO TABS
5.0000 mg | ORAL_TABLET | Freq: Every evening | ORAL | Status: DC | PRN
  Filled 2011-09-25: qty 1

## 2011-09-25 MED ORDER — GLUCOSE-VITAMIN C 4-6 GM-MG PO CHEW
CHEWABLE_TABLET | ORAL | Status: AC
  Administered 2011-09-25: 3
  Filled 2011-09-25: qty 1

## 2011-09-25 MED ORDER — DIPHENHYDRAMINE HCL 25 MG PO CAPS: 25.0000 mg | ORAL_CAPSULE | Freq: Every evening | ORAL | Status: DC | PRN

## 2011-09-25 NOTE — Discharge Summary (Addendum)
Physician Discharge Summary  Patient ID: [name] MRN: [id number] DOB/AGE: 07-13-1920 78 y.o.  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 Tyrone Sage on 09/22/2011.  History of Presenting Illness: This is a 78 y.o. African American female who presented to the [facility] office 09/20/2011  for evaluation of her right leg wound. She is status post coronary artery bypass grafting x4 on 08/05/2011 by Dr. Tyrone Sage. She had endoscopic vein harvest from both thighs that had initially healed well. However upon followup to the office, she was noted to have some dehiscence of her right EVH site without infection. She was treated with local wound care including packing with saline wet-to-dry dressings twice a day. She has had significant lower extremity edema and has been on high-dose Lasix. She has noted mostly serous drainage from her incision site. Over the last several days, however, she has had increasing redness, swelling, and drainage from the right lower leg. She also noted a large "blister" at the [facility] site just above the knee. She denies any fevers or chills. She presented to the office today for further evaluation and was seen by Dr. Dorris Fetch, who felt she would require admission for right lower extremity cellulitis.   Brief Hospital Course:  She has remained afebrile and hemodynamically  stable. She was initially placed on Vancomycin;however, culture that was done in the operating room showed Proteus Mirabilis. Vancomycin was discontinued and she was started on Cipro. Her wound vac was changed on Friday 09/23/2011.Her wound was clean and there was granulation present.Her last creatinine was down to 1.43.Her wound vac was last changed on Monday. Her right leg wounds are clean,  Granulating, and continuing to heal well.She is going to be discharged on Tuesday 09/26/2011 .  Filed Vitals:   09/25/11 0347  BP: 140/63  Pulse: 69  Temp: 98.5 F (36.9 C)  Resp: 19     Latest Vital Signs: Blood pressure 140/63, pulse 69, temperature 98.5 F (36.9 C), temperature source Oral, resp. rate 19, height 5\' 2"  (1.575 m), weight 203 lb 6.4 oz (92.262 kg), SpO2 96.00%.  Physical Exam: Cardiovascular: RRR, no murmurs, gallops, or rubs.  Pulmonary: Diminished at bases; no rales, wheezes, or rhonchi.  Abdomen: Soft, non tender, bowel sounds present.  Extremities: Bilateral lower extremity edema.  Wounds: Vacs in place.   Discharge Condition:Stable  Recent laboratory studies:  Lab Results  Component Value Date   WBC 9.9 09/20/2011   HGB 9.4* 09/20/2011   HCT 29.5* 09/20/2011   MCV 78.2 09/20/2011   PLT 278 09/20/2011   Lab Results  Component Value Date   NA 139 09/23/2011   K 3.9 09/23/2011   CL 100 09/23/2011   CO2 32 09/23/2011   CREATININE 1.43* 09/23/2011   GLUCOSE 85 09/23/2011      Diagnostic Studies: Dg Chest 2 View  09/08/2011  *RADIOLOGY REPORT*  Clinical Data: Cardiac surgery in January, follow-up, some shortness of breath  CHEST - 2 VIEW  Comparison: Chest x-ray of 08/15/2011  Findings: Aeration has improved minimally.  The lungs still not well aerated.  Basilar atelectasis and and a small left pleural effusion remain.  Cardiomegaly is stable.  Median sternotomy sutures are intact.  IMPRESSION: The lungs are not well aerated although aeration has improved minimally.  Basilar  atelectasis and small left effusion remain.  Original Report Authenticated By: Juline Patch, M.D.    Discharge Medications: Medication List  As of 09/25/2011 10:11 AM   TAKE these medications         acetaminophen 325 MG tablet   Commonly known as: TYLENOL   Take 650 mg by mouth every 6 (six) hours as needed. For pain      allopurinol 100 MG tablet   Commonly known as: ZYLOPRIM   Take 100 mg by mouth daily.      amiodarone 400 MG tablet   Commonly known as: PACERONE   Take 200 mg by mouth daily.      aspirin 81 MG tablet   Take 81 mg by mouth daily.      carvedilol 6.25 MG tablet   Commonly known as: COREG   Take 6.25 mg by mouth 2 (two) times daily with a meal.      ciprofloxacin 500 MG tablet   Commonly known as: CIPRO   Take 1 tablet (500 mg total) by mouth 2 (two) times daily. For 10 days then stop.      colesevelam 625 MG tablet   Commonly known as: WELCHOL   Take 3 tablets (1,875 mg total) by mouth 2 (two) times daily with a meal.      ezetimibe 10 MG tablet   Commonly known as: ZETIA   Take 1 tablet (10 mg total) by mouth daily.      folic acid 1 MG tablet   Commonly known as: FOLVITE   Take 1 tablet (1 mg total) by mouth daily.      furosemide 20 MG tablet   Commonly known as: LASIX   Take 80 mg by mouth 2 (two) times daily.      glimepiride 2 MG tablet   Commonly known as: AMARYL   Take 1 tablet (2 mg total) by mouth daily with breakfast.      levothyroxine 100 MCG tablet   Commonly known as: SYNTHROID, LEVOTHROID   Take 100 mcg by mouth daily.      LORazepam 0.5 MG tablet   Commonly known as: ATIVAN   Take 0.5 mg by mouth every 8 (eight) hours as needed. For anxiety      pantoprazole 40 MG tablet   Commonly known as: PROTONIX   Take 1 tablet (40 mg total) by mouth daily at 12 noon.      polysaccharide iron 150 MG Caps capsule   Commonly known as: NIFEREX   Take 150 mg by mouth daily.      sodium bicarbonate 650 MG tablet   Take 650 mg by  mouth daily.      traMADol 50 MG tablet   Commonly known as: ULTRAM   Take 1 tablet (50 mg total) by mouth every 6 (six) hours as needed for pain.            Follow Up Appointments: Follow-up Information    Follow up with GERHARDT,EDWARD B, MD. (Office will call you with an appointment date and time)    Contact information:   [address] [place] [address] [phone]  Signed: Moria Brophy MPA-C 09/25/2011, 10:11 AM

## 2011-09-25 NOTE — Progress Notes (Addendum)
4 Days Post-Op Procedure(s) (LRB): IRRIGATION AND DEBRIDEMENT EXTREMITY (Right)  Subjective:  Patient did not sleep well last night. Had a bowel movement.  Objective: Vital signs in last 24 hours: Patient Vitals for the past 24 hrs:  BP Temp Temp src Pulse Resp SpO2 Weight  09/25/11 0347 140/63 mmHg 98.5 F (36.9 C) Oral 69  19  96 % 203 lb 6.4 oz (92.262 kg)  09/24/11 2032 130/73 mmHg 98.3 F (36.8 C) Oral 67  18  98 % -  09/24/11 1418 124/68 mmHg 98.2 F (36.8 C) Oral 70  18  99 % -    Current Weight  09/25/11 203 lb 6.4 oz (92.262 kg)      Intake/Output from previous day: 03/16 0701 - 03/17 0700 In: 240 [P.O.:240] Out: 876 [Urine:800; Drains:75; Stool:1]   Physical Exam:  Cardiovascular: RRR, no murmurs, gallops, or rubs. Pulmonary: Diminished at bases; no rales, wheezes, or rhonchi. Abdomen: Soft, non tender, bowel sounds present. Extremities: Bilateral lower extremity edema. Wounds: Vacs in place.  Lab Results: CBC: No results found for this basename: WBC:2,HGB:2,HCT:2,PLT:2 in the last 72 hours BMET:   Basename 09/23/11 0500  NA 139  K 3.9  CL 100  CO2 32  GLUCOSE 85  BUN 19  CREATININE 1.43*  CALCIUM 9.4    PT/INR: No results found for this basename: LABPROT,INR in the last 72 hours ABG:  INR: Will add last result for INR, ABG once components are confirmed Will add last 4 CBG results once components are confirmed  Assessment/Plan:  1.S/p drainage, irrigation, debridement and placement of wound vac for right thigh infection. Continue Cipro for Proteus.Wound vac to be changed Monday. 2.Patient has a podiatrist and his name is Dr. Peggye Fothergill. She had an appointment to see him, but got hospitalized.She appears to have an ingrown toenail. No signs of gout. We will do betadine warm soaks bid and a neosporin band aid.She will follow up with Dr. Peggye Fothergill after discharge. 3.Creatnine down to 1.43. 4.Possibly discharge Monday.  [name],[name]  MPA-C 09/25/2011    09/25/2011 9:27 AM     Seen and examined and agree with above Possibly home tomorrow after vac change

## 2011-09-26 ENCOUNTER — Encounter (HOSPITAL_COMMUNITY): Payer: Self-pay | Admitting: Cardiothoracic Surgery

## 2011-09-26 LAB — GLUCOSE, CAPILLARY
Glucose-Capillary: 63 mg/dL — ABNORMAL LOW (ref 70–99)
Glucose-Capillary: 87 mg/dL (ref 70–99)
Glucose-Capillary: 67 mg/dL — ABNORMAL LOW (ref 70–99)
Glucose-Capillary: 112 mg/dL — ABNORMAL HIGH (ref 70–99)

## 2011-09-26 LAB — ANAEROBIC CULTURE

## 2011-09-26 NOTE — Progress Notes (Signed)
   CARE MANAGEMENT NOTE 09/26/2011  Patient:  [name], [name]   Account Number:  [id number]  Date Initiated:  09/21/2011  Documentation initiated by:  [facility]  Subjective/Objective Assessment:   cellulitis - lives with daughter. active with [facility]     Action/Plan:   PT S/P CABG IN JAN; SHE LIVES WITH DAUGHTER AND IS ACTIVE WITH [facility] FOR HOME HEALTH.   Anticipated DC Date:  09/26/2011   Anticipated DC Plan:  HOME W HOME HEALTH SERVICES      DC Planning Services  CM consult      [facility] Choice  HOME HEALTH   Choice offered to / List presented to:  C-1 Patient   DME arranged  VAC      DME agency  [employer]     HH arranged  HH-1 RN  HH-2 PT      [facility] agency  [facility]   Status of service:  In process, will continue to follow Medicare Important Message given?   (If response is "NO", the following Medicare IM given date fields will be blank) Date Medicare IM given:   Date Additional Medicare IM given:    Discharge Disposition:  HOME W HOME HEALTH SERVICES  Per UR Regulation:  Reviewed for med. necessity/level of care/duration of stay  If discussed at Long Length of Stay Meetings, dates discussed:    Comments:  09/26/11 Ashland Osmer,RN,BSN 1600 AWAITING INSURANCE AUTHORIZATION FOR HOME WOUND VAC.  ALL INFORMATION HAS BEEN FAXED IN TO [employer].  CONTACT AT [employer], PHONE [phone].  HOPE TO HAVE AUTHORIZATION IN AM. PT NOTIFIED OF PROCESS; WILL NOTIFY PT AND MD WHEN INSURANCE APPROVAL RECEIVED. Phone #[phone]   09/23/11 Khaleesi Gruel,RN,BSN 1200 PT HAD WOUND VAC PLACED ON 3/13...MAY NEED HOME VAC.  WILL COMPLETE WOUND VAC INSURANCE AUTH APPLICATION.  WILL NEED MD SIGNATURE ON VAC APPLICATION, AND THEN FAX TO [employer].  WILL CONT TO FOLLOW. Phone #[phone]   09/20/11 Beaulah Romanek,RN,BSN 1430 MET WITH PT AND DAUGHTER.  PT STATES SHE HAS HOME O2 THROUGH [facility], WHICH SHE WEARS "MOST OF THE TIME".  SHE OWNS A BSC, WHEELCHAIR AND A TUB SEAT.  HHRN FOLLOWING AT  HOME--WILL NEED RESUMPTION ORDRES FOR HOME HEALTH CARE PRIOR TO DC.  WILL NOTIFY [facility] OF HOSPITAL ADMISSION. Phone #[phone]

## 2011-09-26 NOTE — Progress Notes (Signed)
Soaked pt's Left great toe in betadine solution and warm water for 20 minutes.

## 2011-09-26 NOTE — Consult Note (Signed)
WOC consult Note Requested to assist with vac dressing change.  Bedside nurse performed on Friday.  Right thigh with 2 full thickness wounds post surgical debridement.  2X1X1cm and 3X1X2.5cm.  Both beefy red with mod pink drainage in cannister, no odor.  One piece black sponge applied to 125cm and yed together to machine.  Assisted bedside nurse to perform dressing change.  Pt medicated prior to procedure and tolerated with mod discomfort.  Induration surrounding wounds. Plan for bedside nurse to change  dressing on Wed.  Cammie Mcgee, RN, MSN, [employer]  [phone]

## 2011-09-26 NOTE — Progress Notes (Signed)
Physical Therapy Treatment Patient Details Name: [name] MRN: [id number] DOB: [birth year] Today's Date: 09/26/2011  PT Assessment/Plan  PT - Assessment/Plan Comments on Treatment Session: Pt s/p VAC to RLE due to cellulitis. Pt moving well but limited by fatigue and pain in RLE with ambulation, pt reports no pain at rest. Encouraged continued ambulation to bathroom and in room with nursing staff as well as continued HEP. PT Plan: Discharge plan remains appropriate Follow Up Recommendations: Home health PT;Supervision for mobility/OOB PT Goals  Acute Rehab PT Goals Pt will go Supine/Side to Sit: with modified independence;with HOB 0 degrees PT Goal: Supine/Side to Sit - Progress: Progressing toward goal PT Goal: Sit to Supine/Side - Progress: Progressing toward goal PT Goal: Sit to Stand - Progress: Met PT Goal: Stand to Sit - Progress: Met Pt will Ambulate: 51 - 150 feet PT Goal: Ambulate - Progress: Revised due to lack of progress PT Goal: Up/Down Stairs - Progress: Met Additional Goals PT Goal: Additional Goal #1 - Progress: Discontinued (comment) (goal from prior admission)  PT Treatment Precautions/Restrictions  Precautions Precautions: Fall Precaution Comments: wound VAC Restrictions Weight Bearing Restrictions: No Mobility (including Balance) Bed Mobility Rolling Right: 6: Modified independent (Device/Increase time);With rail Right Sidelying to Sit: 6: Modified independent (Device/Increase time);With rails;HOB elevated (comment degrees) (HOB 20 degrees) Sitting - Scoot to Edge of Bed: 6: Modified independent (Device/Increase time) Transfers Sit to Stand: 6: Modified independent (Device/Increase time);From bed;From chair/3-in-1;With armrests;From toilet Stand to Sit: 6: Modified independent (Device/Increase time);To chair/3-in-1;With armrests;To toilet Ambulation/Gait Ambulation/Gait: Yes Ambulation/Gait Assistance: 5: Supervision Ambulation/Gait Assistance  Details (indicate cue type and reason): cues to extend trunk and step into RW. Pt ambulates with RLE externally rotated outside RW leg due to discomfort of VAC rubbing leg and flexed trunk Ambulation Distance (Feet): 20 Feet (15', 10', 20') Assistive device: Rolling walker Gait Pattern: Step-to pattern;Trunk flexed Stairs: Yes Stairs Assistance: 5: Supervision Stair Management Technique: One rail Right Number of Stairs: 2  Height of Stairs: 8   Posture/Postural Control Posture/Postural Control: Postural limitations Postural Limitations: flexed trunk Exercise  General Exercises - Lower Extremity Long Arc Quad: AROM;Both;20 reps;Seated Hip ABduction/ADduction: AROM;Both;20 reps;Seated Hip Flexion/Marching: AROM;Both;Other reps (comment);Seated (20reps) End of Session PT - End of Session Activity Tolerance: Patient limited by fatigue Patient left: in chair;with call bell in reach Nurse Communication: Mobility status for transfers;Mobility status for ambulation General Behavior During Session: [facility] for tasks performed Cognition: [name] for tasks performed  Delorse Lek 09/26/2011, 9:51 AM Delaney Meigs, PT [phone]

## 2011-09-26 NOTE — Progress Notes (Signed)
Pt's CBG 67. Pt asymptomatic. Gave pt crackers/dinner tray. Pt's CBG improved to 115. Will continue to monitor

## 2011-09-26 NOTE — Progress Notes (Addendum)
                    [address]            [place] [address]          [phone]     5 Days Post-Op Procedure(s) (LRB): IRRIGATION AND DEBRIDEMENT EXTREMITY (Right)  Subjective: No complaints.  Objective: Vital signs in last 24 hours: Patient Vitals for the past 24 hrs:  BP Temp Temp src Pulse Resp SpO2  09/26/11 0514 130/72 mmHg 97.8 F (36.6 C) Oral 74  19  98 %  09/25/11 1942 161/78 mmHg 98.5 F (36.9 C) Oral 75  18  98 %  09/25/11 1448 106/64 mmHg 98.2 F (36.8 C) Oral 67  16  100 %   Current Weight  09/25/11 92.262 kg (203 lb 6.4 oz)     Intake/Output from previous day: 03/17 0701 - 03/18 0700 In: 240 [P.O.:240] Out: 1100 [Urine:900; Drains:200]  CBGs 82-119-87  PHYSICAL EXAM:  Heart: RRR Lungs: clear Wound: VACs in place R thigh and R calf, no significant surrounding erythema, decreased LE edema   Lab Results: CBC:No results found for this basename: WBC:2,HGB:2,HCT:2,PLT:2 in the last 72 hours BMET: No results found for this basename: NA:2,K:2,CL:2,CO2:2,GLUCOSE:2,BUN:2,CREATININE:2,CALCIUM:2 in the last 72 hours  PT/INR: No results found for this basename: LABPROT,INR in the last 72 hours   Assessment/Plan: S/P Procedure(s) (LRB): IRRIGATION AND DEBRIDEMENT EXTREMITY (Right) For VAC change today.  If wounds stable and VAC can be arranged for home, may be able to discharge later today vs in am.   LOS: 6 days    COLLINS,GINA H 09/26/2011   I have seen and examined [name] and agree with the above assessment  and plan. All three organisms growing sensitive to cipro Home with [facility]  [name] MD Beeper [phone] Office [phone] 09/26/2011 2:23 PM

## 2011-09-26 NOTE — Progress Notes (Signed)
Wound Vac dressing changed today. Pt tolerated well. Serous/sanguanous drainage noted.

## 2011-09-27 LAB — GLUCOSE, CAPILLARY
Glucose-Capillary: 92 mg/dL (ref 70–99)
Glucose-Capillary: 74 mg/dL (ref 70–99)

## 2011-09-27 MED ORDER — POLYSACCHARIDE IRON 150 MG PO CAPS: 150.0000 mg | ORAL_CAPSULE | Freq: Every day | ORAL | Status: DC

## 2011-09-27 NOTE — Progress Notes (Signed)
UR Completed.  Janoah Menna Jane [phone] 09/27/2011  

## 2011-09-27 NOTE — Progress Notes (Signed)
Physical Therapy Treatment Patient Details Name: [name] MRN: [id number] DOB: [birth year] Today's Date: 09/27/2011  PT Assessment/Plan  PT - Assessment/Plan Comments on Treatment Session: Pt ready for discharge and agreeable to PT today secondary unsure of when HHPT will resume. Education for mobility and encouraged to progress ambulation provided. Cues at sink for safety and position. Will defer to HHPT for further management. PT Plan: Discharge plan remains appropriate Follow Up Recommendations: Home health PT PT Goals  Acute Rehab PT Goals Pt will Ambulate: 51 - 150 feet;with supervision;with least restrictive assistive device PT Goal: Ambulate - Progress: Met  PT Treatment Precautions/Restrictions  Precautions Precautions: Fall Precaution Comments: VAC Restrictions Weight Bearing Restrictions: No Mobility (including Balance) Bed Mobility Bed Mobility: No Transfers Sit to Stand: 6: Modified independent (Device/Increase time);From chair/3-in-1;From toilet Stand to Sit: 6: Modified independent (Device/Increase time);To chair/3-in-1;To toilet Ambulation/Gait Ambulation/Gait Assistance: 5: Supervision Ambulation/Gait Assistance Details (indicate cue type and reason): Pt maintains flexed trunk with RLE external rotation posterior to RW due to [facility] 2, Cues to step into RW as close as comfortable and extend trunk Ambulation Distance (Feet): 105 Feet (15' then 105') Assistive device: Rolling walker Gait Pattern: Step-to pattern;Trunk flexed Stairs: No    Exercise  General Exercises - Lower Extremity Long Arc Quad: AROM;Both;20 reps;Seated Hip ABduction/ADduction: AROM;Both;20 reps;Seated Hip Flexion/Marching: AROM;Both;Other reps (comment);Seated (20reps) End of Session PT - End of Session Activity Tolerance: Patient tolerated treatment well Patient left: in chair;with call bell in reach;with family/visitor present Nurse Communication: Mobility status for  transfers;Mobility status for ambulation General Behavior During Session: [facility] for tasks performed Cognition: [facility] for tasks performed  Delorse Lek 09/27/2011, 10:59 AM Delaney Meigs, PT [phone]

## 2011-09-27 NOTE — Progress Notes (Signed)
6 Days Post-Op Procedure(s) (LRB): IRRIGATION AND DEBRIDEMENT EXTREMITY (Right)  Subjective: Patient with less right thigh and leg pain.  Objective: Vital signs in last 24 hours: Patient Vitals for the past 24 hrs:  BP Temp Temp src Pulse Resp SpO2 Weight  09/27/11 0428 129/71 mmHg 98.3 F (36.8 C) Oral 71  20  100 % 201 lb 8 oz (91.4 kg)  09/26/11 1958 150/72 mmHg 98 F (36.7 C) Other 68  20  99 % -  09/26/11 1315 133/65 mmHg 97 F (36.1 C) Oral 70  18  97 % -    Current Weight  09/27/11 201 lb 8 oz (91.4 kg)      Intake/Output from previous day: 03/18 0701 - 03/19 0700 In: 1320 [P.O.:1320] Out: 800 [Urine:800]   Physical Exam:  Cardiovascular: RRR, no murmurs, gallops, or rubs. Pulmonary: Diminished at bases; no rales, wheezes, or rhonchi. Abdomen: Soft, non tender, bowel sounds present. Extremities: Bilateral lower extremity edema. Wounds: Vacs in place.  Lab Results: CBC: No results found for this basename: WBC:2,HGB:2,HCT:2,PLT:2 in the last 72 hours BMET:  No results found for this basename: NA:2,K:2,CL:2,CO2:2,GLUCOSE:2,BUN:2,CREATININE:2,CALCIUM:2 in the last 72 hours  PT/INR: No results found for this basename: LABPROT,INR in the last 72 hours ABG:  INR: Will add last result for INR, ABG once components are confirmed Will add last 4 CBG results once components are confirmed  Assessment/Plan:  1.S/p drainage, irrigation, debridement and placement of wound vac for right thigh infection. Continue Cipro for Proteus.Wound vac was changed yesterday. 2.Patient has a podiatrist and his name is Dr. Peggye Fothergill. She had an appointment to see him, but got hospitalized.She appears to have an ingrown toenail. No signs of gout. We will do betadine warm soaks bid and a neosporin band aid.She will follow up with Dr. Peggye Fothergill after discharge. 3.Creatnine down to 1.43. 4.Discharge today.  Vu Liebman MPA-C 09/27/2011    09/27/2011 7:40 AM

## 2011-09-30 NOTE — Progress Notes (Signed)
   CARE MANAGEMENT NOTE 09/30/2011  Patient:  [name], [name]   Account Number:  [id number]  Date Initiated:  09/21/2011  Documentation initiated by:  [facility]  Subjective/Objective Assessment:   cellulitis - lives with daughter. active with [facility]     Action/Plan:   PT S/P CABG IN JAN; SHE LIVES WITH DAUGHTER AND IS ACTIVE WITH [facility] FOR HOME HEALTH.   Anticipated DC Date:  09/26/2011   Anticipated DC Plan:  HOME W HOME HEALTH SERVICES      DC Planning Services  CM consult      [facility] Choice  HOME HEALTH   Choice offered to / List presented to:  C-1 Patient   DME arranged  VAC      DME agency  [employer]     HH arranged  HH-1 RN  HH-2 PT      [facility] agency  [facility]   Status of service:  Completed, signed off Medicare Important Message given?   (If response is "NO", the following Medicare IM given date fields will be blank) Date Medicare IM given:   Date Additional Medicare IM given:    Discharge Disposition:  HOME W HOME HEALTH SERVICES  Per UR Regulation:  Reviewed for med. necessity/level of care/duration of stay  If discussed at Long Length of Stay Meetings, dates discussed:    Comments:  09/27/11 Brice Kossman,RN,BSN WOUND VAC APPROVED BY INSURANCE AND DELIVERED BY [employer] TO PT'S ROOM PT DISCHARGED TO HOME WITH DAUGHTER AND HH AS ARRANGED.  NOTIFIED [facility] OF DC HOME.  09/26/11 Zayven Powe,RN,BSN 1600 AWAITING INSURANCE AUTHORIZATION FOR HOME WOUND VAC.  ALL INFORMATION HAS BEEN FAXED IN TO [employer].  CONTACT AT [employer], PHONE [phone].  HOPE TO HAVE AUTHORIZATION IN AM. PT NOTIFIED OF PROCESS; WILL NOTIFY PT AND MD WHEN INSURANCE APPROVAL RECEIVED. Phone #[phone]   09/23/11 Emett Stapel,RN,BSN 1200 PT HAD WOUND VAC PLACED ON 3/13...MAY NEED HOME VAC.  WILL COMPLETE WOUND VAC INSURANCE AUTH APPLICATION.  WILL NEED MD SIGNATURE ON VAC APPLICATION, AND THEN FAX TO [employer].  WILL CONT TO FOLLOW. Phone #[phone]   09/20/11 Giancarlo Askren,RN,BSN  1430 MET WITH PT AND DAUGHTER.  PT STATES SHE HAS HOME O2 THROUGH [facility], WHICH SHE WEARS "MOST OF THE TIME".  SHE OWNS A BSC, WHEELCHAIR AND A TUB SEAT.  HHRN FOLLOWING AT HOME--WILL NEED RESUMPTION ORDRES FOR HOME HEALTH CARE PRIOR TO DC.  WILL NOTIFY [facility] OF HOSPITAL ADMISSION.

## 2011-10-03 ENCOUNTER — Ambulatory Visit (INDEPENDENT_AMBULATORY_CARE_PROVIDER_SITE_OTHER): Payer: Self-pay | Admitting: Physician Assistant

## 2011-10-03 VITALS — BP 153/71 | HR 70 | Temp 97.1°F | Resp 20

## 2011-10-03 DIAGNOSIS — T07XXXA Unspecified multiple injuries, initial encounter: Secondary | ICD-10-CM

## 2011-10-03 DIAGNOSIS — Z951 Presence of aortocoronary bypass graft: Secondary | ICD-10-CM

## 2011-10-03 DIAGNOSIS — I2510 Atherosclerotic heart disease of native coronary artery without angina pectoris: Secondary | ICD-10-CM

## 2011-10-03 DIAGNOSIS — Z9889 Other specified postprocedural states: Secondary | ICD-10-CM

## 2011-10-03 MED ORDER — LORAZEPAM 0.5 MG PO TABS: 0.5000 mg | ORAL_TABLET | Freq: Three times a day (TID) | ORAL | Status: DC | PRN

## 2011-10-03 NOTE — Progress Notes (Signed)
HPI: Patient returns for routine postoperative follow-up having undergone wound debridement of her RLE with wound vac placement on 09/21/2011.  Wound cultures obtained during the procedure were positive for Proteus Mirabilis.  She was discharged home on a regimen of Ciprofloxacin. Since hospital discharge the patient reports she has been experiencing some nausea and vomiting.  She did have an episode of diarrhea which she attributed to use of a laxative.  The patient and her daughter believe these symptoms are related to either her iron or the Ciprofloxacin.  The patient had actually stopped her Cipro on Sunday night to see if this would relieve her nausea and vomiting.  The patient and her daughter were instructed on the importance of continuing and finishing all of her Cipro.  She denies any fevers, chills, or sweats.  Her wound vac has been changed twice since discharge.  The daughter states that they had to call the facility to send another nurse out because the vac was not functioning properly after each dressing change.  They also state the output is starting to decrease.  Overall the patient states she is doing much better and is not experiencing much pain in her RLE.  Finally, they requested a refill for her Ativan.  We explained to the patient and her daughter that this is not a medication that our office should be routinely refilling.  We are willing to refill it this one last time, but from now on she will need to receive this medication from her PCP.   Current Outpatient Prescriptions  Medication Sig Dispense Refill  . acetaminophen (TYLENOL) 325 MG tablet Take 650 mg by mouth every 6 (six) hours as needed. For pain      . allopurinol (ZYLOPRIM) 100 MG tablet Take 100 mg by mouth daily.       Marland Kitchen amiodarone (PACERONE) 400 MG tablet Take 200 mg by mouth daily.      Marland Kitchen aspirin 81 MG tablet Take 81 mg by mouth daily.        . carvedilol (COREG) 6.25 MG tablet Take 6.25 mg by mouth 2 (two) times  daily with a meal.      . ciprofloxacin (CIPRO) 500 MG tablet Take 1 tablet (500 mg total) by mouth 2 (two) times daily. For 10 days then stop.  20 tablet  0  . colesevelam (WELCHOL) 625 MG tablet Take 3 tablets (1,875 mg total) by mouth 2 (two) times daily with a meal.  180 tablet  5  . ezetimibe (ZETIA) 10 MG tablet Take 1 tablet (10 mg total) by mouth daily.  30 tablet  5  . folic acid (FOLVITE) 1 MG tablet Take 1 tablet (1 mg total) by mouth daily.  30 tablet  1  . furosemide (LASIX) 20 MG tablet Take 80 mg by mouth 2 (two) times daily.       Marland Kitchen glimepiride (AMARYL) 2 MG tablet Take 1 tablet (2 mg total) by mouth daily with breakfast.      . levothyroxine (SYNTHROID, LEVOTHROID) 100 MCG tablet Take 100 mcg by mouth daily.        Marland Kitchen LORazepam (ATIVAN) 0.5 MG tablet Take 0.5 mg by mouth every 8 (eight) hours as needed. For anxiety      . pantoprazole (PROTONIX) 40 MG tablet Take 1 tablet (40 mg total) by mouth daily at 12 noon.  30 tablet  5  . polysaccharide iron (NIFEREX) 150 MG CAPS capsule Take 1 capsule (150 mg total) by mouth daily.  30 each  0  . sodium bicarbonate 650 MG tablet Take 650 mg by mouth daily.        . traMADol (ULTRAM) 50 MG tablet Take 1 tablet (50 mg total) by mouth every 6 (six) hours as needed for pain.  45 tablet  0  . LORazepam (ATIVAN) 0.5 MG tablet Take 1 tablet (0.5 mg total) by mouth every 8 (eight) hours as needed for anxiety.  30 tablet  0    Physical Exam:  Gen: Patient appears pale, alert, no acute distress RLE; There are 2 wounds present one above and one below the knee.  Both of these appear to be clean, with no purulent drainag present.  Neither of these wounds track.  Impression:  Mrs. Salomone is doing better.  Her RLE cellulitis is resolving.  Plan:  We will continue the wound vac at this time.  It will continue to be changed on a regular basis per home wound care.  The patient was instructed to finish all of her Cipro.  We will provide her a one  time refill on Ativan 0.5mg  every 8 hours as needed.  Finally she is to follow up in our office in 2 weeks time, at which point we hope to discontinue her wound vac.

## 2011-10-04 NOTE — Progress Notes (Signed)
Attempt was made to reapply the wound VAC before [name] left the office.  There was a leak after the dressings were applied and she wasn't happy that the dressing had been placed across the bend of her knee as this causes her discomfort.  The dressings were removed and wet to dry normal saline dressings were applied.  [facility] was notified to see if someone could go to their home today to reapply the [facility] or wait until tomorrow.

## 2011-10-13 ENCOUNTER — Inpatient Hospital Stay (HOSPITAL_COMMUNITY)
Admission: AD | Admit: 2011-10-13 | Discharge: 2011-10-19 | DRG: 603 | Disposition: A | Discharge status: Home / Self Care | Payer: Medicare Other | Source: Ambulatory Visit | Attending: Cardiothoracic Surgery | Admitting: Cardiothoracic Surgery

## 2011-10-13 ENCOUNTER — Encounter: Payer: Self-pay | Admitting: Cardiothoracic Surgery

## 2011-10-13 ENCOUNTER — Ambulatory Visit (INDEPENDENT_AMBULATORY_CARE_PROVIDER_SITE_OTHER): Payer: Self-pay | Admitting: Thoracic Surgery

## 2011-10-13 ENCOUNTER — Encounter (HOSPITAL_COMMUNITY): Payer: Self-pay | Admitting: General Practice

## 2011-10-13 VITALS — BP 149/68 | HR 62 | Temp 96.7°F | Resp 20

## 2011-10-13 DIAGNOSIS — A4902 Methicillin resistant Staphylococcus aureus infection, unspecified site: Secondary | ICD-10-CM | POA: Diagnosis present

## 2011-10-13 DIAGNOSIS — N189 Chronic kidney disease, unspecified: Secondary | ICD-10-CM | POA: Diagnosis present

## 2011-10-13 DIAGNOSIS — I2510 Atherosclerotic heart disease of native coronary artery without angina pectoris: Secondary | ICD-10-CM

## 2011-10-13 DIAGNOSIS — A498 Other bacterial infections of unspecified site: Secondary | ICD-10-CM

## 2011-10-13 DIAGNOSIS — L02419 Cutaneous abscess of limb, unspecified: Principal | ICD-10-CM | POA: Diagnosis present

## 2011-10-13 DIAGNOSIS — E039 Hypothyroidism, unspecified: Secondary | ICD-10-CM | POA: Diagnosis present

## 2011-10-13 DIAGNOSIS — I129 Hypertensive chronic kidney disease with stage 1 through stage 4 chronic kidney disease, or unspecified chronic kidney disease: Secondary | ICD-10-CM | POA: Diagnosis present

## 2011-10-13 DIAGNOSIS — R5381 Other malaise: Secondary | ICD-10-CM | POA: Diagnosis present

## 2011-10-13 DIAGNOSIS — Z951 Presence of aortocoronary bypass graft: Secondary | ICD-10-CM

## 2011-10-13 DIAGNOSIS — L03119 Cellulitis of unspecified part of limb: Secondary | ICD-10-CM

## 2011-10-13 DIAGNOSIS — E119 Type 2 diabetes mellitus without complications: Secondary | ICD-10-CM | POA: Diagnosis present

## 2011-10-13 DIAGNOSIS — I252 Old myocardial infarction: Secondary | ICD-10-CM

## 2011-10-13 DIAGNOSIS — T07XXXA Unspecified multiple injuries, initial encounter: Secondary | ICD-10-CM

## 2011-10-13 DIAGNOSIS — Z9889 Other specified postprocedural states: Secondary | ICD-10-CM

## 2011-10-13 LAB — CBC
Hemoglobin: 10.4 g/dL — ABNORMAL LOW (ref 12.0–15.0)
MCV: 74.3 fL — ABNORMAL LOW (ref 78.0–100.0)
Platelets: 290 10*3/uL (ref 150–400)
RBC: 4.39 MIL/uL (ref 3.87–5.11)
WBC: 6.7 10*3/uL (ref 4.0–10.5)

## 2011-10-13 LAB — BASIC METABOLIC PANEL
CO2: 27 mEq/L (ref 19–32)
Calcium: 9.6 mg/dL (ref 8.4–10.5)
Glucose, Bld: 80 mg/dL (ref 70–99)
Potassium: 3.6 mEq/L (ref 3.5–5.1)
Sodium: 139 mEq/L (ref 135–145)

## 2011-10-13 LAB — DIFFERENTIAL
Eosinophils Relative: 4 % (ref 0–5)
Lymphocytes Relative: 22 % (ref 12–46)
Lymphs Abs: 1.4 10*3/uL (ref 0.7–4.0)
Monocytes Relative: 14 % — ABNORMAL HIGH (ref 3–12)
Neutrophils Relative %: 61 % (ref 43–77)

## 2011-10-13 MED ORDER — SODIUM CHLORIDE 0.9 % IV SOLN: 250.0000 mL | INTRAVENOUS | Status: DC | PRN

## 2011-10-13 MED ORDER — LEVOTHYROXINE SODIUM 100 MCG PO TABS
100.0000 ug | ORAL_TABLET | Freq: Every day | ORAL | Status: DC
  Administered 2011-10-14 – 2011-10-19 (×6): 100 ug via ORAL
  Filled 2011-10-13 (×7): qty 1

## 2011-10-13 MED ORDER — VANCOMYCIN HCL IN DEXTROSE 1-5 GM/200ML-% IV SOLN: 1000.0000 mg | Freq: Two times a day (BID) | INTRAVENOUS | Status: DC

## 2011-10-13 MED ORDER — DOCUSATE SODIUM 100 MG PO CAPS
100.0000 mg | ORAL_CAPSULE | Freq: Two times a day (BID) | ORAL | Status: DC
  Administered 2011-10-13 – 2011-10-19 (×10): 100 mg via ORAL
  Filled 2011-10-13 (×13): qty 1

## 2011-10-13 MED ORDER — COLESEVELAM HCL 625 MG PO TABS
1875.0000 mg | ORAL_TABLET | Freq: Two times a day (BID) | ORAL | Status: DC
  Administered 2011-10-13 – 2011-10-19 (×13): 1875 mg via ORAL
  Filled 2011-10-13 (×16): qty 3

## 2011-10-13 MED ORDER — ALLOPURINOL 100 MG PO TABS
100.0000 mg | ORAL_TABLET | Freq: Every day | ORAL | Status: DC
  Administered 2011-10-14 – 2011-10-19 (×6): 100 mg via ORAL
  Filled 2011-10-13 (×6): qty 1

## 2011-10-13 MED ORDER — CEFTRIAXONE SODIUM 1 G IJ SOLR
1.0000 g | INTRAMUSCULAR | Status: DC
  Administered 2011-10-13 – 2011-10-18 (×5): 1 g via INTRAVENOUS
  Filled 2011-10-13 (×7): qty 10

## 2011-10-13 MED ORDER — ACETAMINOPHEN 325 MG PO TABS: 650.0000 mg | ORAL_TABLET | Freq: Four times a day (QID) | ORAL | Status: DC | PRN

## 2011-10-13 MED ORDER — VANCOMYCIN HCL IN DEXTROSE 1-5 GM/200ML-% IV SOLN
1000.0000 mg | INTRAVENOUS | Status: DC
  Administered 2011-10-13: 1000 mg via INTRAVENOUS
  Filled 2011-10-13 (×2): qty 200

## 2011-10-13 MED ORDER — SODIUM BICARBONATE 650 MG PO TABS
650.0000 mg | ORAL_TABLET | Freq: Every day | ORAL | Status: DC
  Administered 2011-10-14 – 2011-10-19 (×6): 650 mg via ORAL
  Filled 2011-10-13 (×6): qty 1

## 2011-10-13 MED ORDER — AMIODARONE HCL 200 MG PO TABS
200.0000 mg | ORAL_TABLET | Freq: Every day | ORAL | Status: DC
  Administered 2011-10-14 – 2011-10-19 (×6): 200 mg via ORAL
  Filled 2011-10-13 (×6): qty 1

## 2011-10-13 MED ORDER — SODIUM CHLORIDE 0.9 % IJ SOLN: 3.0000 mL | INTRAMUSCULAR | Status: DC | PRN

## 2011-10-13 MED ORDER — FUROSEMIDE 80 MG PO TABS
80.0000 mg | ORAL_TABLET | Freq: Two times a day (BID) | ORAL | Status: DC
  Administered 2011-10-13 – 2011-10-19 (×12): 80 mg via ORAL
  Filled 2011-10-13 (×15): qty 1

## 2011-10-13 MED ORDER — ENOXAPARIN SODIUM 40 MG/0.4ML ~~LOC~~ SOLN
40.0000 mg | SUBCUTANEOUS | Status: DC
  Administered 2011-10-13: 40 mg via SUBCUTANEOUS
  Filled 2011-10-13 (×2): qty 0.4

## 2011-10-13 MED ORDER — SODIUM CHLORIDE 0.9 % IJ SOLN
3.0000 mL | Freq: Two times a day (BID) | INTRAMUSCULAR | Status: DC
  Administered 2011-10-13 – 2011-10-19 (×10): 3 mL via INTRAVENOUS

## 2011-10-13 MED ORDER — GLIMEPIRIDE 2 MG PO TABS
2.0000 mg | ORAL_TABLET | Freq: Every day | ORAL | Status: DC
Start: 2011-10-14 — End: 2011-10-19
  Administered 2011-10-14 – 2011-10-18 (×2): 2 mg via ORAL
  Filled 2011-10-13 (×7): qty 1

## 2011-10-13 MED ORDER — CARVEDILOL 6.25 MG PO TABS
6.2500 mg | ORAL_TABLET | Freq: Two times a day (BID) | ORAL | Status: DC
  Administered 2011-10-14 – 2011-10-19 (×11): 6.25 mg via ORAL
  Filled 2011-10-13 (×14): qty 1

## 2011-10-13 MED ORDER — ACETAMINOPHEN 650 MG RE SUPP: 650.0000 mg | Freq: Four times a day (QID) | RECTAL | Status: DC | PRN

## 2011-10-13 MED ORDER — SODIUM CHLORIDE 0.9 % IJ SOLN: 3.0000 mL | Freq: Two times a day (BID) | INTRAMUSCULAR | Status: DC

## 2011-10-13 MED ORDER — EZETIMIBE 10 MG PO TABS
10.0000 mg | ORAL_TABLET | Freq: Every day | ORAL | Status: DC
  Administered 2011-10-14 – 2011-10-19 (×6): 10 mg via ORAL
  Filled 2011-10-13 (×6): qty 1

## 2011-10-13 MED ORDER — ONDANSETRON HCL 4 MG/2ML IJ SOLN: 4.0000 mg | Freq: Four times a day (QID) | INTRAMUSCULAR | Status: DC | PRN

## 2011-10-13 MED ORDER — PANTOPRAZOLE SODIUM 40 MG PO TBEC
40.0000 mg | DELAYED_RELEASE_TABLET | Freq: Every day | ORAL | Status: DC
  Administered 2011-10-15 – 2011-10-19 (×5): 40 mg via ORAL
  Filled 2011-10-13 (×4): qty 1

## 2011-10-13 MED ORDER — OXYCODONE HCL 5 MG PO TABS
5.0000 mg | ORAL_TABLET | ORAL | Status: DC | PRN
  Administered 2011-10-13 – 2011-10-17 (×2): 5 mg via ORAL
  Filled 2011-10-13 (×2): qty 1

## 2011-10-13 MED ORDER — ASPIRIN EC 325 MG PO TBEC
325.0000 mg | DELAYED_RELEASE_TABLET | Freq: Every day | ORAL | Status: DC
  Administered 2011-10-14 – 2011-10-19 (×6): 325 mg via ORAL
  Filled 2011-10-13 (×6): qty 1

## 2011-10-13 MED ORDER — ONDANSETRON HCL 4 MG PO TABS: 4.0000 mg | ORAL_TABLET | Freq: Four times a day (QID) | ORAL | Status: DC | PRN

## 2011-10-13 NOTE — Progress Notes (Signed)
HPI patient had a previous bypass surgery in January 25 and has has marked lymphedema in both legs. She developed cellulitis of the right leg required admission and placement of a wound VAC.S he now comes back again with increasing lymphedema of the right leg with cellulitis and purulent drainage from both the upper wound in the lower wound.The lower wound can be probed.approximate 4 cm and is draining a year and yellowish material.The upper wound edges has some yellow exudate. She's had no fever chills or excessive sputum. She needs to be admitted for hospital care with IV antibiotics diuretics and dressing changes to the right leg.   Current Outpatient Prescriptions  Medication Sig Dispense Refill  . acetaminophen (TYLENOL) 325 MG tablet Take 650 mg by mouth every 6 (six) hours as needed. For pain      . allopurinol (ZYLOPRIM) 100 MG tablet Take 100 mg by mouth daily.       Marland Kitchen amiodarone (PACERONE) 400 MG tablet Take 200 mg by mouth daily.      Marland Kitchen aspirin 81 MG tablet Take 81 mg by mouth daily.        . carvedilol (COREG) 6.25 MG tablet Take 6.25 mg by mouth 2 (two) times daily with a meal.      . colesevelam (WELCHOL) 625 MG tablet Take 3 tablets (1,875 mg total) by mouth 2 (two) times daily with a meal.  180 tablet  5  . ezetimibe (ZETIA) 10 MG tablet Take 1 tablet (10 mg total) by mouth daily.  30 tablet  5  . folic acid (FOLVITE) 1 MG tablet Take 1 tablet (1 mg total) by mouth daily.  30 tablet  1  . furosemide (LASIX) 20 MG tablet Take 80 mg by mouth 2 (two) times daily.       Marland Kitchen glimepiride (AMARYL) 2 MG tablet Take 1 tablet (2 mg total) by mouth daily with breakfast.      . levothyroxine (SYNTHROID, LEVOTHROID) 100 MCG tablet Take 100 mcg by mouth daily.        Marland Kitchen LORazepam (ATIVAN) 0.5 MG tablet Take 1 tablet (0.5 mg total) by mouth every 8 (eight) hours as needed for anxiety.  30 tablet  0  . pantoprazole (PROTONIX) 40 MG tablet Take 1 tablet (40 mg total) by mouth daily at 12 noon.  30  tablet  5  . sodium bicarbonate 650 MG tablet Take 650 mg by mouth daily.           Review of Systems unchanged except for the right leg:   Physical Exam lungs are clear to auscultation percussion heart regular rate right leg as described above   Diagnostic Tests:cellulitis right leg with marked lymphedema   Impression cellulitis right leg with marked lymphedema:   Plan: In the hospital antibiotics intravenously dressing changes and diuretics

## 2011-10-13 NOTE — Progress Notes (Signed)
ANTIBIOTIC CONSULT NOTE - INITIAL  Pharmacy Consult for Vancomycin Indication: cellulitis  Allergies  Allergen Reactions  . Statins Rash    Patient Measurements:    Vital Signs: Temp: 97 F (36.1 C) (04/04 1706) Temp src: Oral (04/04 1706) BP: 117/61 mmHg (04/04 1706) Pulse Rate: 62  (04/04 1706) Intake/Output from previous day:   Intake/Output from this shift:    Labs: No results found for this basename: WBC:3,HGB:3,PLT:3,LABCREA:3,CREATININE:3 in the last 72 hours The CrCl is unknown because both a height and weight (above a minimum accepted value) are required for this calculation. No results found for this basename: VANCOTROUGH:2,VANCOPEAK:2,VANCORANDOM:2,GENTTROUGH:2,GENTPEAK:2,GENTRANDOM:2,TOBRATROUGH:2,TOBRAPEAK:2,TOBRARND:2,AMIKACINPEAK:2,AMIKACINTROU:2,AMIKACIN:2, in the last 72 hours   Microbiology: Recent Results (from the past 720 hour(s))  CULTURE, ROUTINE-ABSCESS     Status: Normal   Collection Time   09/20/11 12:00 PM      Component Value Range Status Comment   Culture Moderate PROTEUS MIRABILIS   Final    GRAM STAIN Few   Final    GRAM STAIN WBC present-both PMN and Mononuclear   Final    GRAM STAIN No Squamous Epithelial Cells Seen   Final    GRAM STAIN Rare Gram Negative Rods   Final    Organism ID, Bacteria PROTEUS MIRABILIS   Final   CULTURE, ROUTINE-ABSCESS     Status: Normal   Collection Time   09/20/11 12:00 PM      Component Value Range Status Comment   Culture     Final    Value: Abundant KLEBSIELLA PNEUMONIAE     Abundant PSEUDOMONAS AERUGINOSA     Abundant ESCHERICHIA COLI   GRAM STAIN Few   Final    GRAM STAIN WBC present-predominately PMN   Final    GRAM STAIN No Squamous Epithelial Cells Seen   Final    GRAM STAIN Moderate Gram Positive Cocci In Pairs   Final    GRAM STAIN Abundant Gram Negative Rods   Final    Organism ID, Bacteria KLEBSIELLA PNEUMONIAE   Final    Organism ID, Bacteria PSEUDOMONAS AERUGINOSA   Final    Organism ID,  Bacteria ESCHERICHIA COLI   Final   MRSA PCR SCREENING     Status: Normal   Collection Time   09/20/11  5:45 PM      Component Value Range Status Comment   MRSA by PCR NEGATIVE  NEGATIVE  Final   SURGICAL PCR SCREEN     Status: Normal   Collection Time   09/20/11  6:41 PM      Component Value Range Status Comment   MRSA, PCR NEGATIVE  NEGATIVE  Final    Staphylococcus aureus NEGATIVE  NEGATIVE  Final   ANAEROBIC CULTURE     Status: Normal   Collection Time   09/21/11 12:02 PM      Component Value Range Status Comment   Specimen Description ABSCESS LEG RIGHT   Final    Special Requests PROXIMAL ANTEROMEDIAL ABSCESS RIGHT TIBIA   Final    Gram Stain     Final    Value: FEW WBC PRESENT, PREDOMINANTLY PMN     NO SQUAMOUS EPITHELIAL CELLS SEEN     NO ORGANISMS SEEN   Culture NO ANAEROBES ISOLATED   Final    Report Status 09/26/2011 FINAL   Final   CULTURE, ROUTINE-ABSCESS     Status: Normal   Collection Time   09/21/11 12:02 PM      Component Value Range Status Comment   Specimen Description ABSCESS LEG RIGHT  Final    Special Requests PROXIMAL ANTEROMEDIAL ABSCESS RIGHT TIBIA   Final    Gram Stain     Final    Value: FEW WBC PRESENT, PREDOMINANTLY PMN     NO SQUAMOUS EPITHELIAL CELLS SEEN     NO ORGANISMS SEEN   Culture FEW PROTEUS MIRABILIS   Final    Report Status 09/24/2011 FINAL   Final    Organism ID, Bacteria PROTEUS MIRABILIS   Final   ANAEROBIC CULTURE     Status: Normal   Collection Time   09/21/11 12:10 PM      Component Value Range Status Comment   Specimen Description ABSCESS LEG RIGHT   Final    Special Requests ANTEROLATERAL RIGHT TIBIAL ABSCESS   Final    Gram Stain     Final    Value: NO WBC SEEN     NO SQUAMOUS EPITHELIAL CELLS SEEN     RARE GRAM POSITIVE COCCI IN PAIRS   Culture NO ANAEROBES ISOLATED   Final    Report Status 09/26/2011 FINAL   Final     Medical History: Past Medical History  Diagnosis Date  . Diabetes mellitus   . Hypertension   .  PONV (postoperative nausea and vomiting)   . Angina   . Shortness of breath   . Chronic kidney disease   . Hypothyroidism   . Headache   . Arthritis   . Anxiety   . Myocardial infarction   . Pneumonia   . Episodic lightheadedness, with near syncope X 3 07/30/2011  . Coronary artery disease   . 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 Paroxysmal atrial fibrillation, post CABG 08/10/2011  . Anemia associated with acute blood loss, related to CABG 08/10/2011    Medications:  Scheduled:    . allopurinol  100 mg Oral Daily  . amiodarone  200 mg Oral Daily  . aspirin EC  325 mg Oral Daily  . carvedilol  6.25 mg Oral BID WC  . cefTRIAXone (ROCEPHIN) IVPB 1 gram/50 mL D5W  1 g Intravenous Q24H  . colesevelam  1,875 mg Oral BID WC  . docusate sodium  100 mg Oral BID  . enoxaparin  40 mg Subcutaneous Q24H  . ezetimibe  10 mg Oral Daily  . furosemide  80 mg Oral BID  . glimepiride  2 mg Oral Q breakfast  . levothyroxine  100 mcg Oral QAC breakfast  . pantoprazole  40 mg Oral Q1200  . sodium bicarbonate  650 mg Oral Daily  . sodium chloride  3 mL Intravenous Q12H  . DISCONTD: sodium chloride  3 mL Intravenous Q12H  . DISCONTD: vancomycin  1,000 mg Intravenous Q12H   Assessment: Pt admitted with worsening cellulitis of RLE and drainage from incision site.  She had CABG x 4 in Jan 2013.  Plan for I&D of wound on 4/5.   She is obese and has hx diabetes as well as chronic renal insufficiency.  Pt is currently afebrile. Wound cx pending.   Goal of Therapy:  Vancomycin trough level 10-15 mcg/ml  Plan:  1) Rocephin per MD 2) Vancomycin 1000mg  IV q24h 3) F/U renal function and culture data 4) Check vancomycin trough level at steady state.   Elson Clan 10/13/2011,6:05 PM

## 2011-10-14 DIAGNOSIS — R609 Edema, unspecified: Secondary | ICD-10-CM

## 2011-10-14 DIAGNOSIS — L03119 Cellulitis of unspecified part of limb: Secondary | ICD-10-CM

## 2011-10-14 DIAGNOSIS — L02419 Cutaneous abscess of limb, unspecified: Secondary | ICD-10-CM

## 2011-10-14 LAB — CBC
Hemoglobin: 9.2 g/dL — ABNORMAL LOW (ref 12.0–15.0)
MCH: 22.7 pg — ABNORMAL LOW (ref 26.0–34.0)
MCV: 74.3 fL — ABNORMAL LOW (ref 78.0–100.0)
Platelets: 270 10*3/uL (ref 150–400)
RBC: 4.05 MIL/uL (ref 3.87–5.11)
WBC: 6.1 10*3/uL (ref 4.0–10.5)

## 2011-10-14 LAB — PREALBUMIN: Prealbumin: 15.1 mg/dL — ABNORMAL LOW (ref 17.0–34.0)

## 2011-10-14 LAB — GLUCOSE, CAPILLARY
Glucose-Capillary: 75 mg/dL (ref 70–99)
Glucose-Capillary: 124 mg/dL — ABNORMAL HIGH (ref 70–99)
Glucose-Capillary: 65 mg/dL — ABNORMAL LOW (ref 70–99)
Glucose-Capillary: 120 mg/dL — ABNORMAL HIGH (ref 70–99)

## 2011-10-14 MED ORDER — ENOXAPARIN SODIUM 30 MG/0.3ML ~~LOC~~ SOLN
30.0000 mg | SUBCUTANEOUS | Status: DC
  Administered 2011-10-14 – 2011-10-18 (×5): 30 mg via SUBCUTANEOUS
  Filled 2011-10-14 (×7): qty 0.3

## 2011-10-14 MED ORDER — PRO-STAT SUGAR FREE PO LIQD
30.0000 mL | Freq: Three times a day (TID) | ORAL | Status: DC
  Administered 2011-10-14 – 2011-10-19 (×16): 30 mL via ORAL
  Filled 2011-10-14 (×18): qty 30

## 2011-10-14 MED ORDER — SODIUM CHLORIDE 0.9 % IJ SOLN
10.0000 mL | INTRAMUSCULAR | Status: DC | PRN
  Administered 2011-10-15 – 2011-10-19 (×16): 10 mL

## 2011-10-14 MED ORDER — VANCOMYCIN HCL IN DEXTROSE 1-5 GM/200ML-% IV SOLN
1000.0000 mg | INTRAVENOUS | Status: DC
  Administered 2011-10-14 – 2011-10-18 (×3): 1000 mg via INTRAVENOUS
  Filled 2011-10-14 (×3): qty 200

## 2011-10-14 MED ORDER — SODIUM CHLORIDE 0.9 % IV SOLN
750.0000 mg | INTRAVENOUS | Status: DC
  Filled 2011-10-14: qty 750

## 2011-10-14 NOTE — Progress Notes (Signed)
   CARE MANAGEMENT NOTE 10/14/2011  Patient:  [name], [name]   Account Number:  [id number]  Date Initiated:  10/14/2011  Documentation initiated by:  Kimberley Speece  Subjective/Objective Assessment:   PT READMITTED WITH CELLULITIS OF RT LEG; PTA, PT LIVES AT HOME WITH DAUGHTER AND IS ACTIVE WITH [facility] FOR HOME CARE NEEDS.     Action/Plan:   NOTIFIED [facility] OF PT ADMISSION.  WILL CONT TO FOLLOW.  PT WILL NEED RESUMPTION OF CARE ORDERS PRIOR TO DC HOME TO CONT HH CARE.   Anticipated DC Date:  10/18/2011   Anticipated DC Plan:  HOME W HOME HEALTH SERVICES      DC Planning Services  CM consult      [facility] Choice  Resumption Of Svcs/PTA Provider   Choice offered to / List presented to:             Status of service:  In process, will continue to follow Medicare Important Message given?   (If response is "NO", the following Medicare IM given date fields will be blank) Date Medicare IM given:   Date Additional Medicare IM given:    Discharge Disposition:    Per UR Regulation:    If discussed at Long Length of Stay Meetings, dates discussed:    Comments:     Jerrell Belfast, RN, BSN Phone #[phone]

## 2011-10-14 NOTE — Progress Notes (Signed)
VASCULAR LAB PRELIMINARY  PRELIMINARY  PRELIMINARY  PRELIMINARY  Bilateral lower extremity venous duplex completed.    Preliminary report:  Bilateral:  No evidence of DVT, superficial thrombosis, or Baker's Cyst.   Naithan Delage D, RVS 10/14/2011, 10:24 AM

## 2011-10-14 NOTE — Progress Notes (Signed)
INITIAL ADULT NUTRITION ASSESSMENT Date: 10/14/2011   Time: 11:03 AM  Reason for Assessment: Consult  ASSESSMENT: Female 78 y.o.  Dx: cellulitis & abscess of right lower extremity  Hx:  Past Medical History  Diagnosis Date  . Diabetes mellitus   . Hypertension   . PONV (postoperative nausea and vomiting)   . Angina   . Shortness of breath   . Chronic kidney disease   . Hypothyroidism   . Headache   . Arthritis   . Anxiety   . Myocardial infarction   . Pneumonia   . Episodic lightheadedness, with near syncope X 3 07/30/2011  . Coronary artery disease   . 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 Paroxysmal atrial fibrillation, post CABG 08/10/2011  . Anemia associated with acute blood loss, related to CABG 08/10/2011    Related Meds:  Past Medical History  Diagnosis Date  . Diabetes mellitus   . Hypertension   . PONV (postoperative nausea and vomiting)   . Angina   . Shortness of breath   . Chronic kidney disease   . Hypothyroidism   . Headache   . Arthritis   . Anxiety   . Myocardial infarction   . Pneumonia   . Episodic lightheadedness, with near syncope X 3 07/30/2011  . Coronary artery disease   . 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 Paroxysmal atrial fibrillation, post CABG 08/10/2011  . Anemia associated with acute blood loss, related to CABG 08/10/2011    Ht: 5' 1.81" (157 cm)  Wt: 201 lb 8 oz (91.4 kg)  Ideal Wt: 47.7 kg % Ideal Wt: 191%  Usual Wt: 212 lb -- per office visit record February 2013 % Usual Wt: 95%  Body mass index is 37.08 kg/(m^2).  Food/Nutrition Related Hx: no triggers per admission nutrition screen  Labs:  CMP     Component Value Date/Time   NA 139 10/13/2011 1810   K 3.6 10/13/2011 1810   CL 101 10/13/2011 1810   CO2 27 10/13/2011 1810   GLUCOSE 80 10/13/2011 1810   BUN 23 10/13/2011 1810   CREATININE 1.69* 10/13/2011 1810   CREATININE 1.57* 08/29/2011  1553   CALCIUM 9.6 10/13/2011 1810   PROT 6.9 07/30/2011 1152   ALBUMIN 3.6 07/30/2011 1152   AST 39* 07/30/2011 1152   ALT 44* 07/30/2011 1152   ALKPHOS 135* 07/30/2011 1152   BILITOT 0.3 07/30/2011 1152   GFRNONAA 26* 10/13/2011 1810   GFRAA 30* 10/13/2011 1810    Diet Order: Carbohydrate Modified Medium Calorie  Supplements/Tube Feeding: N/A  IVF: saline lock IV  Estimated Nutritional Needs:   Kcal: 1600-1800 Protein: 90-100 gm Fluid: 1.6-1.8 L  RD unable to obtain nutrition hx at this time; pt admitted with worsening cellulitis of right lower extremity from incision site; s/p CABG in January 2013; plan for I&D today; PO intake per flowsheet records is 50-100%; pt would benefit from supplemental addition -- RD to order.  NUTRITION DIAGNOSIS: -Increased nutrient needs (NI-5.1).  Status: Ongoing  RELATED TO: wound healing  AS EVIDENCE BY: estimated nutrition needs  MONITORING/EVALUATION(Goals): Goal: meet >90% of estimated nutrition needs to promote healing & recovery Monitor: PO intake, labs, weight, I/O's  EDUCATION NEEDS: -No education needs identified at this time  INTERVENTION:  Add Prosat liquid protein 30 ml PO TID (72 kcals, 15 gm protein per dose)  RD to  follow for nutrition care plan  Dietitian #: [phone]  DOCUMENTATION CODES Per approved criteria  -Obesity Unspecified    Alger Memos 10/14/2011, 11:03 AM

## 2011-10-14 NOTE — H&P (Signed)
Subjective:  [name] is an 78 yo female well known to [facility] who was sent to clinic today by her home health nurse.  The patient was recently admitted in March for RLE cellulitis and abscess requiring OR debridement.  She was discharged home with a wound vac and antibiotics.  She presented for post operative follow-up on 10/03/2011 at which time her wound was healing well with no purulent drainage present.  We planned to see her back again in 2 weeks however.  However, the patient states that the wound care nurses presented to her home on Wednesday 10/12/2011 to change her wound vac.  She states that did not like the appearance of the wound and felt they should leave the wound vac off at that time.  The wound care nurses went back to the patient's house on Thursday at which time they stated her wound appeared to infected and she was referred to our office.  Dr. Edwyna Shell evaluated the patient and felt that she would benefit from admission, because she appeared to be developing cellulitis again.  She also was noted to have a large amount of LE edema.  Upon arrival I spoke with [name].  She states that her wound has been draining clear drainage.  She denies any fevers, but did state she has been experiencing chills off and on.  She states she completed the course of Cipro she was discharged on.     Patient Active Problem List  Diagnoses Date Noted  . Cellulitis and abscess of leg, except foot 09/22/2011  . Paroxysmal atrial fibrillation, post CABG 08/10/2011  . Anemia associated with acute blood loss, related to CABG 08/10/2011  . S/P CABG x 3,LIMA - sequential prox-mid LAD, SVG-OM, SVG- RPDA, 08/05/11 08/05/2011    Class: Hospitalized for  . Episodic lightheadedness, with near syncope X 3 07/30/2011  . Racing heart beat, episodically 07/30/2011  . Chest pain at rest, possible cardiac. 07/30/2011  . Drug reaction 06/01/2011  . Renal insufficiency 05/28/2011  . Diabetes type 2, controlled 05/28/2011   . History of gout 05/28/2011  . Hypothyroidism 05/27/2011  . CAD, multiple vessel, severe disease, no stents placed only angioplasty to mid RCA.  05/26/2011  . Hypertension, uncontrolled on arrival 07/30/11 05/26/2011  . Dyslipidemia 05/26/2011   Past Medical History  Diagnosis Date  . Diabetes mellitus   . Hypertension   . PONV (postoperative nausea and vomiting)   . Angina   . Shortness of breath   . Chronic kidney disease   . Hypothyroidism   . Headache   . Arthritis   . Anxiety   . Myocardial infarction   . Pneumonia   . Episodic lightheadedness, with near syncope X 3 07/30/2011  . Coronary artery disease   . 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 Paroxysmal atrial fibrillation, post CABG 08/10/2011  . Anemia associated with acute blood loss, related to CABG 08/10/2011    Past Surgical History  Procedure Date  . Cardiac catheterization   . Coronary angioplasty   . Coronary artery bypass graft 08/05/2011    Procedure: CORONARY ARTERY BYPASS GRAFTING (CABG);  Surgeon: [name], MD;  Location: [facility] OR;  Service: Open Heart Surgery;  Laterality: N/A;  coronary artery bypass graft times 4 using left internal mammary artery and right leg saphenous vein harvested endoscopically  . Joint replacement     both hips replaced  . I&d extremity 09/21/2011  Procedure: IRRIGATION AND DEBRIDEMENT EXTREMITY;  Surgeon: [name], MD;  Location: [facility] OR;  Service: Vascular;  Laterality: Right;  with wound vac placement    Prescriptions prior to admission  Medication Sig Dispense Refill  . acetaminophen (TYLENOL) 325 MG tablet Take 650 mg by mouth every 6 (six) hours as needed. For pain      . allopurinol (ZYLOPRIM) 100 MG tablet Take 100 mg by mouth daily.       Marland Kitchen amiodarone (PACERONE) 400 MG tablet Take 200 mg by mouth daily.      Marland Kitchen aspirin 81 MG tablet Take 81 mg by mouth daily.        . carvedilol (COREG) 6.25 MG tablet Take 6.25 mg by  mouth 2 (two) times daily with a meal.      . colesevelam (WELCHOL) 625 MG tablet Take 3 tablets (1,875 mg total) by mouth 2 (two) times daily with a meal.  180 tablet  5  . ezetimibe (ZETIA) 10 MG tablet Take 1 tablet (10 mg total) by mouth daily.  30 tablet  5  . folic acid (FOLVITE) 1 MG tablet Take 1 tablet (1 mg total) by mouth daily.  30 tablet  1  . furosemide (LASIX) 20 MG tablet Take 80 mg by mouth 2 (two) times daily.       Marland Kitchen glimepiride (AMARYL) 2 MG tablet Take 2 mg by mouth daily with breakfast.      . levothyroxine (SYNTHROID, LEVOTHROID) 100 MCG tablet Take 100 mcg by mouth daily.        Marland Kitchen LORazepam (ATIVAN) 0.5 MG tablet Take 0.5 mg by mouth every 8 (eight) hours as needed. For anxiety      . pantoprazole (PROTONIX) 40 MG tablet Take 1 tablet (40 mg total) by mouth daily at 12 noon.  30 tablet  5  . sodium bicarbonate 650 MG tablet Take 650 mg by mouth daily.        Marland Kitchen DISCONTD: glimepiride (AMARYL) 2 MG tablet Take 1 tablet (2 mg total) by mouth daily with breakfast.      . DISCONTD: LORazepam (ATIVAN) 0.5 MG tablet Take 1 tablet (0.5 mg total) by mouth every 8 (eight) hours as needed for anxiety.  30 tablet  0   Allergies  Allergen Reactions  . Statins Rash    History  Substance Use Topics  . Smoking status: Never Smoker   . Smokeless tobacco: Never Used  . Alcohol Use: No    Family History  Problem Relation Age of Onset  . Breast cancer Mother   . Heart disease Father     Review of Systems Constitutional: positive for chills Respiratory: positive for dyspnea on exertion Cardiovascular: negative Gastrointestinal: negative Integument: 2 open sores in RLE Endocrine: positive for diabetes   Objective: Patient Vitals for the past 8 hrs:  BP Temp Temp src Pulse Resp SpO2 Height Weight  10/14/11 0900 - - - - - - 5' 1.81" (1.57 m) 201 lb 8 oz (91.4 kg)  10/14/11 0548 146/80 mmHg 96.8 F (36 C) Oral 61  16  100 % - -   BP 146/80  Pulse 61  Temp(Src) 96.8 F (36  C) (Oral)  Resp 16  Ht 5' 1.81" (1.57 m)  Wt 201 lb 8 oz (91.4 kg)  BMI 37.08 kg/m2  SpO2 100% General appearance: alert, no distress and morbidly obese Head: Normocephalic, without obvious abnormality, atraumatic Eyes: conjunctivae/corneas clear. PERRL, EOM's intact. Fundi benign. Lungs: clear to auscultation bilaterally  Heart: regular rate and rhythm Abdomen: soft, non-tender; bowel sounds normal; no masses,  no organomegaly Extremities: edema 4+ Skin: RLE erythema present, 2 open sores on RLE, lower sore tracks approximately 3 inches up leg, sersosangiunous draiange preset Neurologic: Grossly normal  Assessment/Plan:  1. Right Leg Cellulits 2. Re-culture open wounds-previous cultures performed 09/3011 3. Start IV Vancomycin and IV Rocephin 4. PICC Line Placement 5. HTN- order home medication regimen 6. DM- order home insulin regimen and sliding scale  coverage 7. Admit to Inpatient

## 2011-10-14 NOTE — Progress Notes (Signed)
Patient ID: SYLVIA HELMS, female   DOB: [birth year], 78 y.o.   MRN: [id number]                   [address]            [name] [address]          [phone]          LOS: 1 day   Subjective: Patient admitted yesterday for increasing swelling in her right leg with cellulitis type changes. The previous sites of wound infection that had been treated with debridement and then back have significantly granulated in to the point where the fact dressing no longer has enough space to even place.  Objective: Vital signs in last 24 hours: Patient Vitals for the past 24 hrs:  BP Temp Temp src Pulse Resp SpO2 Height Weight  10/14/11 0900 - - - - - - 5' 1.81" (1.57 m) 201 lb 8 oz (91.4 kg)  10/14/11 0548 146/80 mmHg 96.8 F (36 C) Oral 61  16  100 % - -  10/13/11 1929 150/70 mmHg 96.8 F (36 C) Oral 65  16  100 % - -  10/13/11 1822 - - - 67  - - - -  10/13/11 1706 117/61 mmHg 97 F (36.1 C) Oral 62  21  100 % - -    Filed Weights   10/14/11 0900  Weight: 201 lb 8 oz (91.4 kg)          Intake/Output this shift: Total I/O In: 240 [P.O.:240] Out: -   Scheduled Meds:   . allopurinol  100 mg Oral Daily  . amiodarone  200 mg Oral Daily  . aspirin EC  325 mg Oral Daily  . carvedilol  6.25 mg Oral BID WC  . cefTRIAXone (ROCEPHIN) IVPB 1 gram/50 mL D5W  1 g Intravenous Q24H  . colesevelam  1,875 mg Oral BID WC  . docusate sodium  100 mg Oral BID  . enoxaparin  30 mg Subcutaneous Q24H  . ezetimibe  10 mg Oral Daily  . feeding supplement  30 mL Oral TID WC  . furosemide  80 mg Oral BID  . glimepiride  2 mg Oral Q breakfast  . levothyroxine  100 mcg Oral QAC breakfast  . pantoprazole  40 mg Oral Q1200  . sodium bicarbonate  650 mg Oral Daily  . sodium chloride  3 mL Intravenous Q12H  . vancomycin  1,000 mg Intravenous Q48H  . DISCONTD: enoxaparin  40 mg Subcutaneous Q24H  . DISCONTD: sodium chloride  3 mL Intravenous Q12H  . DISCONTD: vancomycin  750 mg  Intravenous Q24H  . DISCONTD: vancomycin  1,000 mg Intravenous Q12H  . DISCONTD: vancomycin  1,000 mg Intravenous Q24H   Continuous Infusions:  PRN Meds:.sodium chloride, acetaminophen, acetaminophen, ondansetron (ZOFRAN) IV, ondansetron, oxyCODONE, sodium chloride  General appearance: alert, cooperative and no distress Neurologic: intact Heart: regular rate and rhythm, S1, S2 normal, no murmur, click, rub or gallop Lungs: clear to auscultation bilaterally Extremities: edema She continues to have edema in the right leg greater than the left. The degree of cellulitis has decreased from what it was described yesterday. As noted the wounds are very superficial now Wound: Sternum is stable and well healed  Lab Results: CBC: Basename 10/14/11 0545 10/13/11 1810  WBC 6.1 6.7  HGB 9.2* 10.4*  HCT 30.1* 32.6*  PLT 270 290   BMET:  Basename 10/13/11 1810  NA 139  K  3.6  CL 101  CO2 27  GLUCOSE 80  BUN 23  CREATININE 1.69*  CALCIUM 9.6    PT/INR: No results found for this basename: LABPROT,INR in the last 72 hours   Radiology No results found.   Assessment/Plan: With her sedentary life, and now edema in the right leg with probable cellulitis she is being treated with antibiotics for right leg cellulitis. In addition we will obtain lower extremity Dopplers to rule out the possibility of DVT.   [name] MD 10/14/2011 12:33 PM

## 2011-10-14 NOTE — Progress Notes (Signed)
ANTIBIOTIC CONSULT NOTE - FOLLOW UP  Pharmacy Consult for vancomycin Indication: cellulitis RLE s/p CABG  Allergies  Allergen Reactions  . Statins Rash    Patient Measurements: Height: 5' 1.81" (157 cm) Weight: 201 lb 8 oz (91.4 kg) IBW/kg (Calculated) : 49.67  Adjusted Body Weight:   Vital Signs: Temp: 96.8 F (36 C) (04/05 0548) Temp src: Oral (04/05 0548) BP: 146/80 mmHg (04/05 0548) Pulse Rate: 61  (04/05 0548) Intake/Output from previous day:   Intake/Output from this shift:    Labs:  Basename 10/14/11 0545 10/13/11 1810  WBC 6.1 6.7  HGB 9.2* 10.4*  PLT 270 290  LABCREA -- --  CREATININE -- 1.69*   Estimated Creatinine Clearance: 24.1 ml/min (by C-G formula based on Cr of 1.69). No results found for this basename: VANCOTROUGH:2,VANCOPEAK:2,VANCORANDOM:2,GENTTROUGH:2,GENTPEAK:2,GENTRANDOM:2,TOBRATROUGH:2,TOBRAPEAK:2,TOBRARND:2,AMIKACINPEAK:2,AMIKACINTROU:2,AMIKACIN:2, in the last 72 hours   Microbiology: Recent Results (from the past 720 hour(s))  CULTURE, ROUTINE-ABSCESS     Status: Normal   Collection Time   09/20/11 12:00 PM      Component Value Range Status Comment   Culture Moderate PROTEUS MIRABILIS   Final    GRAM STAIN Few   Final    GRAM STAIN WBC present-both PMN and Mononuclear   Final    GRAM STAIN No Squamous Epithelial Cells Seen   Final    GRAM STAIN Rare Gram Negative Rods   Final    Organism ID, Bacteria PROTEUS MIRABILIS   Final   CULTURE, ROUTINE-ABSCESS     Status: Normal   Collection Time   09/20/11 12:00 PM      Component Value Range Status Comment   Culture     Final    Value: Abundant KLEBSIELLA PNEUMONIAE     Abundant PSEUDOMONAS AERUGINOSA     Abundant ESCHERICHIA COLI   GRAM STAIN Few   Final    GRAM STAIN WBC present-predominately PMN   Final    GRAM STAIN No Squamous Epithelial Cells Seen   Final    GRAM STAIN Moderate Gram Positive Cocci In Pairs   Final    GRAM STAIN Abundant Gram Negative Rods   Final    Organism ID,  Bacteria KLEBSIELLA PNEUMONIAE   Final    Organism ID, Bacteria PSEUDOMONAS AERUGINOSA   Final    Organism ID, Bacteria ESCHERICHIA COLI   Final   MRSA PCR SCREENING     Status: Normal   Collection Time   09/20/11  5:45 PM      Component Value Range Status Comment   MRSA by PCR NEGATIVE  NEGATIVE  Final   SURGICAL PCR SCREEN     Status: Normal   Collection Time   09/20/11  6:41 PM      Component Value Range Status Comment   MRSA, PCR NEGATIVE  NEGATIVE  Final    Staphylococcus aureus NEGATIVE  NEGATIVE  Final   ANAEROBIC CULTURE     Status: Normal   Collection Time   09/21/11 12:02 PM      Component Value Range Status Comment   Specimen Description ABSCESS LEG RIGHT   Final    Special Requests PROXIMAL ANTEROMEDIAL ABSCESS RIGHT TIBIA   Final    Gram Stain     Final    Value: FEW WBC PRESENT, PREDOMINANTLY PMN     NO SQUAMOUS EPITHELIAL CELLS SEEN     NO ORGANISMS SEEN   Culture NO ANAEROBES ISOLATED   Final    Report Status 09/26/2011 FINAL   Final   CULTURE, ROUTINE-ABSCESS  Status: Normal   Collection Time   09/21/11 12:02 PM      Component Value Range Status Comment   Specimen Description ABSCESS LEG RIGHT   Final    Special Requests PROXIMAL ANTEROMEDIAL ABSCESS RIGHT TIBIA   Final    Gram Stain     Final    Value: FEW WBC PRESENT, PREDOMINANTLY PMN     NO SQUAMOUS EPITHELIAL CELLS SEEN     NO ORGANISMS SEEN   Culture FEW PROTEUS MIRABILIS   Final    Report Status 09/24/2011 FINAL   Final    Organism ID, Bacteria PROTEUS MIRABILIS   Final   ANAEROBIC CULTURE     Status: Normal   Collection Time   09/21/11 12:10 PM      Component Value Range Status Comment   Specimen Description ABSCESS LEG RIGHT   Final    Special Requests ANTEROLATERAL RIGHT TIBIAL ABSCESS   Final    Gram Stain     Final    Value: NO WBC SEEN     NO SQUAMOUS EPITHELIAL CELLS SEEN     RARE GRAM POSITIVE COCCI IN PAIRS   Culture NO ANAEROBES ISOLATED   Final    Report Status 09/26/2011 FINAL    Final   WOUND CULTURE     Status: Normal (Preliminary result)   Collection Time   10/13/11  5:15 PM      Component Value Range Status Comment   Specimen Description WOUND LEG LEFT   Final    Special Requests NONE   Final    Gram Stain     Final    Value: NO WBC SEEN     NO SQUAMOUS EPITHELIAL CELLS SEEN     NO ORGANISMS SEEN   Culture Culture reincubated for better growth   Final    Report Status PENDING   Incomplete     Anti-infectives     Start     Dose/Rate Route Frequency Ordered Stop   10/14/11 1200   vancomycin (VANCOCIN) 750 mg in sodium chloride 0.9 % 150 mL IVPB        750 mg 150 mL/hr over 60 Minutes Intravenous Every 24 hours 10/14/11 0931     10/13/11 2000   vancomycin (VANCOCIN) IVPB 1000 mg/200 mL premix  Status:  Discontinued        1,000 mg 200 mL/hr over 60 Minutes Intravenous Every 24 hours 10/13/11 1812 10/14/11 0931   10/13/11 1800   cefTRIAXone (ROCEPHIN) 1 g in dextrose 5 % 50 mL IVPB        1 g 100 mL/hr over 30 Minutes Intravenous Every 24 hours 10/13/11 1725     10/13/11 1730   vancomycin (VANCOCIN) IVPB 1000 mg/200 mL premix  Status:  Discontinued        1,000 mg 200 mL/hr over 60 Minutes Intravenous Every 12 hours 10/13/11 1725 10/13/11 1737          Assessment: Pt admitted with worsening cellulitis of RLE  and drainage from harvest  Site s/p CABG x 4 in Jan 2013. Plan for I&D of wound on today.  She is obese and has hx diabetes as well as chronic renal insufficiency. Pt is currently afebrile. Wound cx pending.  In march R leg cx grew proteus mirabilis sensitive to ceftriaxone.  Another culture from unknown source grew klebsiella, pseudomonas and e. Coli.    Goal of Therapy:  Vancomycin trough level 10-15 mcg/ml  Plan:  1. Based on patients estimated CrCl  and age, change vancomycin dose to 100mg  IV q48h, give next dose at 1200.to give est trough of 75mcg/ml 2. Change lovenox to 30mg  SQ q24h based on CrCL < 30  Dannielle Huh 10/14/2011,9:32 AM

## 2011-10-14 NOTE — Progress Notes (Signed)
Patient admitted for IV antibiotics for cellulitis at a previous saphenous vein harvest site.  Ultrasound of lower extremity reveals no evidence of DVT  Continue current treatment, including Lovenox prophylaxis

## 2011-10-15 LAB — GLUCOSE, CAPILLARY
Glucose-Capillary: 53 mg/dL — ABNORMAL LOW (ref 70–99)
Glucose-Capillary: 101 mg/dL — ABNORMAL HIGH (ref 70–99)
Glucose-Capillary: 117 mg/dL — ABNORMAL HIGH (ref 70–99)
Glucose-Capillary: 132 mg/dL — ABNORMAL HIGH (ref 70–99)
Glucose-Capillary: 98 mg/dL (ref 70–99)

## 2011-10-15 MED ORDER — BIOTENE DRY MOUTH MT LIQD
15.0000 mL | Freq: Two times a day (BID) | OROMUCOSAL | Status: DC
  Administered 2011-10-15 – 2011-10-18 (×5): 15 mL via OROMUCOSAL

## 2011-10-15 MED ORDER — GLUCOSE 40 % PO GEL
ORAL | Status: AC
  Administered 2011-10-15: 37.5 g
  Filled 2011-10-15: qty 1

## 2011-10-15 NOTE — Progress Notes (Signed)
Hypoglycemic Note:  CBG this morning 53.  Administered glucose gel and came up to 101.  Will continue to monitor.  Arva Chafe

## 2011-10-15 NOTE — Progress Notes (Addendum)
                    [address]            [place] [address]          [phone]          Subjective: Stable, no complaints.  Objective: Vital signs in last 24 hours: Patient Vitals for the past 24 hrs:  BP Temp Temp src Pulse Resp SpO2 Weight  10/15/11 0433 175/79 mmHg 97.3 F (36.3 C) Oral 65  19  100 % 86.501 kg (190 lb 11.2 oz)  10/14/11 2128 148/80 mmHg 97.5 F (36.4 C) Oral 58  18  100 % -   Current Weight  10/15/11 86.501 kg (190 lb 11.2 oz)     Intake/Output from previous day: 04/05 0701 - 04/06 0700 In: 960 [P.O.:960] Out: 2150 [Urine:2150]    PHYSICAL EXAM:  Heart: RRR Lungs: clear Wound: Both LE wounds appear to be granulating well, no necrosis.  No drainage, and no significant erythema. Extremities: +LE edema  Lab Results: CBC: Basename 10/14/11 0545 10/13/11 1810  WBC 6.1 6.7  HGB 9.2* 10.4*  HCT 30.1* 32.6*  PLT 270 290   BMET:  Basename 10/13/11 1810  NA 139  K 3.6  CL 101  CO2 27  GLUCOSE 80  BUN 23  CREATININE 1.69*  CALCIUM 9.6    PT/INR: No results found for this basename: LABPROT,INR in the last 72 hours   Assessment/Plan: RLE cellulitis- Continue Vanc/Rocephin. Local wound care to LE incisions. LE Dopplers negative for DVT.   LOS: 2 days    COLLINS,GINA H 10/15/2011   patient seen and examined. Agree with above. Skin around wounds already looks markedly better

## 2011-10-16 LAB — GLUCOSE, CAPILLARY
Glucose-Capillary: 63 mg/dL — ABNORMAL LOW (ref 70–99)
Glucose-Capillary: 111 mg/dL — ABNORMAL HIGH (ref 70–99)
Glucose-Capillary: 85 mg/dL (ref 70–99)
Glucose-Capillary: 73 mg/dL (ref 70–99)
Glucose-Capillary: 92 mg/dL (ref 70–99)

## 2011-10-16 LAB — WOUND CULTURE: Gram Stain: NONE SEEN

## 2011-10-16 NOTE — Progress Notes (Addendum)
                    [address]            [place] [address]          [phone]          Subjective: No complaints.  Objective: Vital signs in last 24 hours: Patient Vitals for the past 24 hrs:  BP Temp Temp src Pulse Resp SpO2  10/16/11 0428 130/63 mmHg 97.4 F (36.3 C) Oral 66  17  100 %  10/15/11 2025 129/91 mmHg 97.8 F (36.6 C) Oral 72  18  100 %  10/15/11 1433 131/65 mmHg 97.1 F (36.2 C) Oral 54  18  100 %   Current Weight  10/15/11 86.501 kg (190 lb 11.2 oz)     Intake/Output from previous day: 04/06 0701 - 04/07 0700 In: 240 [P.O.:240] Out: 801 [Urine:800; Stool:1]  CBGs 132-98-63-111  PHYSICAL EXAM:  Heart: RRR Lungs: clear Wound: RLE wounds granulating well, minimal residual erythema Extremities: decreased LE edema bilaterally  Lab Results: CBC: Basename 10/14/11 0545 10/13/11 1810  WBC 6.1 6.7  HGB 9.2* 10.4*  HCT 30.1* 32.6*  PLT 270 290   BMET:  Basename 10/13/11 1810  NA 139  K 3.6  CL 101  CO2 27  GLUCOSE 80  BUN 23  CREATININE 1.69*  CALCIUM 9.6    PT/INR: No results found for this basename: LABPROT,INR in the last 72 hours   Assessment/Plan:  RLE cellulitis- Continue Vanc/Rocephin.  Local wound care to LE incisions. Will follow up labs in am.    LOS: 3 days    COLLINS,GINA H 10/16/2011   Leg is looking much better. She needs to ambulate!

## 2011-10-16 NOTE — Progress Notes (Signed)
Notified Dr. Dorris Fetch about positive culture for MRSA at 12:25 PM Pt placed on isolation.

## 2011-10-17 ENCOUNTER — Ambulatory Visit: Payer: Self-pay

## 2011-10-17 LAB — CBC
HCT: 31.9 % — ABNORMAL LOW (ref 36.0–46.0)
MCV: 76.3 fL — ABNORMAL LOW (ref 78.0–100.0)
RDW: 17.4 % — ABNORMAL HIGH (ref 11.5–15.5)
WBC: 6.1 10*3/uL (ref 4.0–10.5)

## 2011-10-17 LAB — GLUCOSE, CAPILLARY
Glucose-Capillary: 63 mg/dL — ABNORMAL LOW (ref 70–99)
Glucose-Capillary: 114 mg/dL — ABNORMAL HIGH (ref 70–99)
Glucose-Capillary: 91 mg/dL (ref 70–99)
Glucose-Capillary: 122 mg/dL — ABNORMAL HIGH (ref 70–99)
Glucose-Capillary: 113 mg/dL — ABNORMAL HIGH (ref 70–99)

## 2011-10-17 LAB — BASIC METABOLIC PANEL
CO2: 31 mEq/L (ref 19–32)
Chloride: 99 mEq/L (ref 96–112)
Creatinine, Ser: 1.45 mg/dL — ABNORMAL HIGH (ref 0.50–1.10)

## 2011-10-17 NOTE — Progress Notes (Signed)
Pt ambulated with rolling walker approx. 250 feet. Pt felt SOB and needed to stop and midway through the walk. Pt placed back in chair in room with feet up and O2 on 2L [place]. Will encourage more walks today.

## 2011-10-17 NOTE — Progress Notes (Addendum)
Subjective:   [name] has no new complaints this morning.  She states that she is feeling much better.  She states she walked some yesterday, and I encouraged her to take at least 3 walks per day.  Objective:  Vital Signs in the last 24 hours: Temp:  [97.3 F (36.3 C)-98.1 F (36.7 C)] 97.3 F (36.3 C) (04/08 0552) Pulse Rate:  [61-65] 65  (04/08 0552) Resp:  [16-20] 20  (04/08 0552) BP: (138-152)/(76-78) 145/78 mmHg (04/08 0552) SpO2:  [100 %] 100 % (04/08 0552) FiO2 (%):  [2 %] 2 % (04/08 0552) Weight:  [189 lb 14.4 oz (86.138 kg)] 189 lb 14.4 oz (86.138 kg) (04/08 0552)  Intake/Output from previous day: 04/07 0701 - 04/08 0700 In: 600 [P.O.:600] Out: -  Intake/Output from this shift:    Physical Exam: General appearance: alert, cooperative and no distress Lungs: clear to auscultation bilaterally Heart: regular rate and rhythm Abdomen: soft, non-tender; bowel sounds normal; no masses,  no organomegaly Extremities: edema 3+ Skin: 2 open incisions on RLE, erythema much improved  Lab Results:  [facility] 10/17/11 0620  WBC 6.1  HGB 9.8*  PLT 241   No results found for this basename: NA:2,K:2,CL:2,CO2:2,GLUCOSE:2,BUN:2,CREATININE:2 in the last 72 hours No results found for this basename: TROPONINI:2,CK,MB:2 in the last 72 hours Hepatic Function Panel No results found for this basename: PROT,ALBUMIN,AST,ALT,ALKPHOS,BILITOT,BILIDIR,IBILI in the last 72 hours No results found for this basename: CHOL in the last 72 hours No results found for this basename: PROTIME in the last 72 hours  Assessment/Plan:   1. RLE Cellulitis-wound cultures positive for MRSA, continue Vancomycin, will discuss with staff if there is a need to continue Rocephin 2. LE edema- patient needs to keep RLE elevated when at rest, will continue diuresis with Lasix 3. Deconditioning- patient needs to ambulate 4. Dispo- patient is doing much better, will likely need home regimen of IV antibiotics and  would also likely benefit from home PT, will discharge once staff feels appropriate  LOS: 4 days    BARRETT, ERIN 10/17/2011, 7:54 AM    Cont IV vanco and Rocephin in the hospital Home on IV vancomycin one dose daily through PICC line at

## 2011-10-17 NOTE — Progress Notes (Signed)
ANTIBIOTIC CONSULT NOTE - FOLLOW UP  Pharmacy Consult for vancomycin Indication: cellulitis RLE s/p CABG  Allergies  Allergen Reactions  . Statins Rash    Patient Measurements: Height: 5' 1.81" (157 cm) Weight: 189 lb 14.4 oz (86.138 kg) IBW/kg (Calculated) : 49.67     Vital Signs: Temp: 97.3 F (36.3 C) (04/08 0552) Temp src: Oral (04/08 0552) BP: 145/78 mmHg (04/08 0552) Pulse Rate: 65  (04/08 0552) Intake/Output from previous day: 04/07 0701 - 04/08 0700 In: 600 [P.O.:600] Out: -  Intake/Output from this shift: Total I/O In: 360 [P.O.:360] Out: -   Labs:  [facility] 10/17/11 0620  WBC 6.1  HGB 9.8*  PLT 241  LABCREA --  CREATININE 1.45*   Estimated Creatinine Clearance: 27.2 ml/min (by C-G formula based on Cr of 1.45). No results found for this basename: VANCOTROUGH:2,VANCOPEAK:2,VANCORANDOM:2,GENTTROUGH:2,GENTPEAK:2,GENTRANDOM:2,TOBRATROUGH:2,TOBRAPEAK:2,TOBRARND:2,AMIKACINPEAK:2,AMIKACINTROU:2,AMIKACIN:2, in the last 72 hours   Microbiology: Recent Results (from the past 720 hour(s))  CULTURE, ROUTINE-ABSCESS     Status: Normal   Collection Time   09/20/11 12:00 PM      Component Value Range Status Comment   Culture Moderate PROTEUS MIRABILIS   Final    GRAM STAIN Few   Final    GRAM STAIN WBC present-both PMN and Mononuclear   Final    GRAM STAIN No Squamous Epithelial Cells Seen   Final    GRAM STAIN Rare Gram Negative Rods   Final    Organism ID, Bacteria PROTEUS MIRABILIS   Final   CULTURE, ROUTINE-ABSCESS     Status: Normal   Collection Time   09/20/11 12:00 PM      Component Value Range Status Comment   Culture     Final    Value: Abundant KLEBSIELLA PNEUMONIAE     Abundant PSEUDOMONAS AERUGINOSA     Abundant ESCHERICHIA COLI   GRAM STAIN Few   Final    GRAM STAIN WBC present-predominately PMN   Final    GRAM STAIN No Squamous Epithelial Cells Seen   Final    GRAM STAIN Moderate Gram Positive Cocci In Pairs   Final    GRAM STAIN Abundant Gram  Negative Rods   Final    Organism ID, Bacteria KLEBSIELLA PNEUMONIAE   Final    Organism ID, Bacteria PSEUDOMONAS AERUGINOSA   Final    Organism ID, Bacteria ESCHERICHIA COLI   Final   MRSA PCR SCREENING     Status: Normal   Collection Time   09/20/11  5:45 PM      Component Value Range Status Comment   MRSA by PCR NEGATIVE  NEGATIVE  Final   SURGICAL PCR SCREEN     Status: Normal   Collection Time   09/20/11  6:41 PM      Component Value Range Status Comment   MRSA, PCR NEGATIVE  NEGATIVE  Final    Staphylococcus aureus NEGATIVE  NEGATIVE  Final   ANAEROBIC CULTURE     Status: Normal   Collection Time   09/21/11 12:02 PM      Component Value Range Status Comment   Specimen Description ABSCESS LEG RIGHT   Final    Special Requests PROXIMAL ANTEROMEDIAL ABSCESS RIGHT TIBIA   Final    Gram Stain     Final    Value: FEW WBC PRESENT, PREDOMINANTLY PMN     NO SQUAMOUS EPITHELIAL CELLS SEEN     NO ORGANISMS SEEN   Culture NO ANAEROBES ISOLATED   Final    Report Status 09/26/2011 FINAL  Final   CULTURE, ROUTINE-ABSCESS     Status: Normal   Collection Time   09/21/11 12:02 PM      Component Value Range Status Comment   Specimen Description ABSCESS LEG RIGHT   Final    Special Requests PROXIMAL ANTEROMEDIAL ABSCESS RIGHT TIBIA   Final    Gram Stain     Final    Value: FEW WBC PRESENT, PREDOMINANTLY PMN     NO SQUAMOUS EPITHELIAL CELLS SEEN     NO ORGANISMS SEEN   Culture FEW PROTEUS MIRABILIS   Final    Report Status 09/24/2011 FINAL   Final    Organism ID, Bacteria PROTEUS MIRABILIS   Final   ANAEROBIC CULTURE     Status: Normal   Collection Time   09/21/11 12:10 PM      Component Value Range Status Comment   Specimen Description ABSCESS LEG RIGHT   Final    Special Requests ANTEROLATERAL RIGHT TIBIAL ABSCESS   Final    Gram Stain     Final    Value: NO WBC SEEN     NO SQUAMOUS EPITHELIAL CELLS SEEN     RARE GRAM POSITIVE COCCI IN PAIRS   Culture NO ANAEROBES ISOLATED   Final      Report Status 09/26/2011 FINAL   Final   WOUND CULTURE     Status: Normal   Collection Time   10/13/11  5:15 PM      Component Value Range Status Comment   Specimen Description WOUND LEG LEFT   Final    Special Requests NONE   Final    Gram Stain     Final    Value: NO WBC SEEN     NO SQUAMOUS EPITHELIAL CELLS SEEN     NO ORGANISMS SEEN   Culture     Final    Value: MODERATE METHICILLIN RESISTANT STAPHYLOCOCCUS AUREUS     Note: RIFAMPIN AND GENTAMICIN SHOULD NOT BE USED AS SINGLE DRUGS FOR TREATMENT OF STAPH INFECTIONS. CRITICAL RESULT CALLED TO, READ BACK BY AND VERIFIED WITH: BONIFACE K @ 1214 BY BOVET 10/16/11   Report Status 10/16/2011 FINAL   Final    Organism ID, Bacteria METHICILLIN RESISTANT STAPHYLOCOCCUS AUREUS   Final     Anti-infectives     Start     Dose/Rate Route Frequency Ordered Stop   10/14/11 1200   vancomycin (VANCOCIN) 750 mg in sodium chloride 0.9 % 150 mL IVPB  Status:  Discontinued        750 mg 150 mL/hr over 60 Minutes Intravenous Every 24 hours 10/14/11 0931 10/14/11 0950   10/14/11 1200   vancomycin (VANCOCIN) IVPB 1000 mg/200 mL premix        1,000 mg 200 mL/hr over 60 Minutes Intravenous Every 48 hours 10/14/11 0950     10/13/11 2000   vancomycin (VANCOCIN) IVPB 1000 mg/200 mL premix  Status:  Discontinued        1,000 mg 200 mL/hr over 60 Minutes Intravenous Every 24 hours 10/13/11 1812 10/14/11 0931   10/13/11 1800   cefTRIAXone (ROCEPHIN) 1 g in dextrose 5 % 50 mL IVPB        1 g 100 mL/hr over 30 Minutes Intravenous Every 24 hours 10/13/11 1725     10/13/11 1730   vancomycin (VANCOCIN) IVPB 1000 mg/200 mL premix  Status:  Discontinued        1,000 mg 200 mL/hr over 60 Minutes Intravenous Every 12 hours 10/13/11 1725 10/13/11 1737  Assessment: Pt admitted with worsening cellulitis of RLE  and drainage from harvest  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 Scr slightly  improved from admission, no UOP recorded past 24 hours. Pt is currently afebrile. WBC count is normal. Wound cx came back positive for moderate MRSA.  In march R leg cx grew proteus mirabilis sensitive to ceftriaxone.  Another culture from unknown source grew klebsiella, pseudomonas and e. Coli.    Goal of Therapy:  Vancomycin trough level 10-15 mcg/ml  Plan:  1. Based on patients estimated CrCl and age, continue vancomycin dose to 1000mg  IV q48h, est trough of 28mcg/ml 2.If patient is probable for going home 4/9-10, it may be somewhat prudent to check vancomycin trough early on 4/9, however it would be more idea on 4/11 after patient has received 3 scheduled doses.  [name] 10/17/2011,1:42 PM

## 2011-10-17 NOTE — Progress Notes (Signed)
Changed pt's dressing on Rt. Leg. Wet-dry. Pt tolerated well. Minimal drainage noted on old dressing. Wound healing nicely.

## 2011-10-18 LAB — GLUCOSE, CAPILLARY
Glucose-Capillary: 81 mg/dL (ref 70–99)
Glucose-Capillary: 86 mg/dL (ref 70–99)
Glucose-Capillary: 97 mg/dL (ref 70–99)
Glucose-Capillary: 95 mg/dL (ref 70–99)

## 2011-10-18 LAB — VANCOMYCIN, TROUGH: Vancomycin Tr: 11.5 ug/mL (ref 10.0–20.0)

## 2011-10-18 MED ORDER — PRO-STAT SUGAR FREE PO LIQD: 30.0000 mL | Freq: Three times a day (TID) | ORAL | Status: DC

## 2011-10-18 MED ORDER — OXYCODONE HCL 5 MG PO TABS: 5.0000 mg | ORAL_TABLET | ORAL | Status: AC | PRN

## 2011-10-18 NOTE — Discharge Instructions (Signed)
Cellulitis Cellulitis is an infection of the tissue under the skin. The infected area is usually red and tender. This is caused by germs. These germs enter the body through cuts or sores. This usually happens in the arms or lower legs. HOME CARE   Take your medicine as told. Finish it even if you start to feel better.   If the infection is on the arm or leg, keep it raised (elevated).   Use a warm cloth on the infected area several times a day.   See your doctor for a follow-up visit as told.  GET HELP RIGHT AWAY IF:   You are tired or confused.   You throw up (vomit).   You have watery poop (diarrhea).   You feel ill and have muscle aches.   You have a fever.  MAKE SURE YOU:   Understand these instructions.   Will watch your condition.   Will get help right away if you are not doing well or get worse.  Document Released: 12/14/2007 Document Revised: 06/16/2011 Document Reviewed: 05/29/2009 [facility] Patient Information 2012 [place], [place].  Peripheral Edema You have swelling in your legs (peripheral edema). This swelling is due to excess accumulation of salt and water in your body. Edema may be a sign of heart, kidney or liver disease, or a side effect of a medication. It may also be due to problems in the leg veins. Elevating your legs and using special support stockings may be very helpful, if the cause of the swelling is due to poor venous circulation. Avoid long periods of standing, whatever the cause. Treatment of edema depends on identifying the cause. Chips, pretzels, pickles and other salty foods should be avoided. Restricting salt in your diet is almost always needed. Water pills (diuretics) are often used to remove the excess salt and water from your body via urine. These medicines prevent the kidney from reabsorbing sodium. This increases urine flow. Diuretic treatment may also result in lowering of potassium levels in your body. Potassium supplements may be needed if  you have to use diuretics daily. Daily weights can help you keep track of your progress in clearing your edema. You should call your caregiver for follow up care as recommended. SEEK IMMEDIATE MEDICAL CARE IF:   You have increased swelling, pain, redness, or heat in your legs.   You develop shortness of breath, especially when lying down.   You develop chest or abdominal pain, weakness, or fainting.   You have a fever.  Document Released: 08/04/2004 Document Revised: 06/16/2011 Document Reviewed: 07/15/2009 [facility] Patient Information 2012 [place], [place].

## 2011-10-18 NOTE — Progress Notes (Addendum)
Subjective:   [name] is doing well.  She is ambulating without difficulty.  She is anxious to go home.  Objective:  Vital Signs in the last 24 hours: Temp:  [97.3 F (36.3 C)-97.6 F (36.4 C)] 97.4 F (36.3 C) (04/09 0408) Pulse Rate:  [66-70] 69  (04/09 0408) Resp:  [18-20] 18  (04/09 0408) BP: (136-169)/(57-81) 162/81 mmHg (04/09 0408) SpO2:  [96 %-100 %] 96 % (04/09 0408)  Intake/Output from previous day: 04/08 0701 - 04/09 0700 In: 360 [P.O.:360] Out: -  Intake/Output from this shift:    Physical Exam:  General appearance: alert, cooperative and no distress Lungs: coarse breath sounds on the right Heart: regular rate and rhythm Abdomen: soft, non-tender; bowel sounds normal; no masses,  no organomegaly Extremities: edema 2+ Skin: RLE incision wounds open, minimal erythema, clear serosanguinous drainage  Lab Results:  [facility] 10/17/11 0620  WBC 6.1  HGB 9.8*  PLT 241    Basename 10/17/11 0620  NA 138  K 3.5  CL 99  CO2 31  GLUCOSE 85  BUN 25*  CREATININE 1.45*   No results found for this basename: TROPONINI:2,CK,MB:2 in the last 72 hours Hepatic Function Panel No results found for this basename: PROT,ALBUMIN,AST,ALT,ALKPHOS,BILITOT,BILIDIR,IBILI in the last 72 hours No results found for this basename: CHOL in the last 72 hours No results found for this basename: PROTIME in the last 72 hours  Assessment/Plan:   1. RLE Cellulitis- +MRSA, currently on Vancomycin and Rocephin 2. LE edema- improving, continue Lasix and elevation of RLE at rest 3. Deconditioning- patient ambulating with walker 4. Dispo- will consult social work, arrange home IV Vanc, wound care, home PT, will aim for discharge home in the next 24-48 hours  LOS: 5 days    [name], [name] 10/18/2011, 7:47 AM    patient examined and medical record reviewed,agree with above note.Home in am on IV Vanc-PICC,f/u in [facility] 4-22,get trough Vanc level before DC VAN TRIGT  [name],[name] 10/18/2011

## 2011-10-18 NOTE — Progress Notes (Signed)
ANTIBIOTIC CONSULT NOTE - FOLLOW UP  Pharmacy Consult for vancomycin Indication: cellulitis RLE s/p CABG  Allergies  Allergen Reactions  . Statins Rash    Patient Measurements: Height: 5' 1.81" (157 cm) Weight: 189 lb 14.4 oz (86.138 kg) IBW/kg (Calculated) : 49.67     Vital Signs: Temp: 97.4 F (36.3 C) (04/09 0408) Temp src: Oral (04/09 0408) BP: 162/81 mmHg (04/09 0408) Pulse Rate: 69  (04/09 0408) Intake/Output from previous day: 04/08 0701 - 04/09 0700 In: 600 [P.O.:600] Out: -  Intake/Output from this shift: Total I/O In: 360 [P.O.:360] Out: -   Labs:  [facility] 10/17/11 0620  WBC 6.1  HGB 9.8*  PLT 241  LABCREA --  CREATININE 1.45*   Estimated Creatinine Clearance: 27.2 ml/min (by C-G formula based on Cr of 1.45).  Basename 10/18/11 1120  VANCOTROUGH 11.5  VANCOPEAK --  VANCORANDOM --  GENTTROUGH --  GENTPEAK --  GENTRANDOM --  TOBRATROUGH --  TOBRAPEAK --  TOBRARND --  AMIKACINPEAK --  AMIKACINTROU --  AMIKACIN --     Microbiology: Recent Results (from the past 720 hour(s))  CULTURE, ROUTINE-ABSCESS     Status: Normal   Collection Time   09/20/11 12:00 PM      Component Value Range Status Comment   Culture Moderate PROTEUS MIRABILIS   Final    GRAM STAIN Few   Final    GRAM STAIN WBC present-both PMN and Mononuclear   Final    GRAM STAIN No Squamous Epithelial Cells Seen   Final    GRAM STAIN Rare Gram Negative Rods   Final    Organism ID, Bacteria PROTEUS MIRABILIS   Final   CULTURE, ROUTINE-ABSCESS     Status: Normal   Collection Time   09/20/11 12:00 PM      Component Value Range Status Comment   Culture     Final    Value: Abundant KLEBSIELLA PNEUMONIAE     Abundant PSEUDOMONAS AERUGINOSA     Abundant ESCHERICHIA COLI   GRAM STAIN Few   Final    GRAM STAIN WBC present-predominately PMN   Final    GRAM STAIN No Squamous Epithelial Cells Seen   Final    GRAM STAIN Moderate Gram Positive Cocci In Pairs   Final    GRAM STAIN  Abundant Gram Negative Rods   Final    Organism ID, Bacteria KLEBSIELLA PNEUMONIAE   Final    Organism ID, Bacteria PSEUDOMONAS AERUGINOSA   Final    Organism ID, Bacteria ESCHERICHIA COLI   Final   MRSA PCR SCREENING     Status: Normal   Collection Time   09/20/11  5:45 PM      Component Value Range Status Comment   MRSA by PCR NEGATIVE  NEGATIVE  Final   SURGICAL PCR SCREEN     Status: Normal   Collection Time   09/20/11  6:41 PM      Component Value Range Status Comment   MRSA, PCR NEGATIVE  NEGATIVE  Final    Staphylococcus aureus NEGATIVE  NEGATIVE  Final   ANAEROBIC CULTURE     Status: Normal   Collection Time   09/21/11 12:02 PM      Component Value Range Status Comment   Specimen Description ABSCESS LEG RIGHT   Final    Special Requests PROXIMAL ANTEROMEDIAL ABSCESS RIGHT TIBIA   Final    Gram Stain     Final    Value: FEW WBC PRESENT, PREDOMINANTLY PMN  NO SQUAMOUS EPITHELIAL CELLS SEEN     NO ORGANISMS SEEN   Culture NO ANAEROBES ISOLATED   Final    Report Status 09/26/2011 FINAL   Final   CULTURE, ROUTINE-ABSCESS     Status: Normal   Collection Time   09/21/11 12:02 PM      Component Value Range Status Comment   Specimen Description ABSCESS LEG RIGHT   Final    Special Requests PROXIMAL ANTEROMEDIAL ABSCESS RIGHT TIBIA   Final    Gram Stain     Final    Value: FEW WBC PRESENT, PREDOMINANTLY PMN     NO SQUAMOUS EPITHELIAL CELLS SEEN     NO ORGANISMS SEEN   Culture FEW PROTEUS MIRABILIS   Final    Report Status 09/24/2011 FINAL   Final    Organism ID, Bacteria PROTEUS MIRABILIS   Final   ANAEROBIC CULTURE     Status: Normal   Collection Time   09/21/11 12:10 PM      Component Value Range Status Comment   Specimen Description ABSCESS LEG RIGHT   Final    Special Requests ANTEROLATERAL RIGHT TIBIAL ABSCESS   Final    Gram Stain     Final    Value: NO WBC SEEN     NO SQUAMOUS EPITHELIAL CELLS SEEN     RARE GRAM POSITIVE COCCI IN PAIRS   Culture NO ANAEROBES  ISOLATED   Final    Report Status 09/26/2011 FINAL   Final   WOUND CULTURE     Status: Normal   Collection Time   10/13/11  5:15 PM      Component Value Range Status Comment   Specimen Description WOUND LEG LEFT   Final    Special Requests NONE   Final    Gram Stain     Final    Value: NO WBC SEEN     NO SQUAMOUS EPITHELIAL CELLS SEEN     NO ORGANISMS SEEN   Culture     Final    Value: MODERATE METHICILLIN RESISTANT STAPHYLOCOCCUS AUREUS     Note: RIFAMPIN AND GENTAMICIN SHOULD NOT BE USED AS SINGLE DRUGS FOR TREATMENT OF STAPH INFECTIONS. CRITICAL RESULT CALLED TO, READ BACK BY AND VERIFIED WITH: BONIFACE K @ 1214 BY BOVET 10/16/11   Report Status 10/16/2011 FINAL   Final    Organism ID, Bacteria METHICILLIN RESISTANT STAPHYLOCOCCUS AUREUS   Final     Anti-infectives     Start     Dose/Rate Route Frequency Ordered Stop   10/14/11 1200   vancomycin (VANCOCIN) 750 mg in sodium chloride 0.9 % 150 mL IVPB  Status:  Discontinued        750 mg 150 mL/hr over 60 Minutes Intravenous Every 24 hours 10/14/11 0931 10/14/11 0950   10/14/11 1200   vancomycin (VANCOCIN) IVPB 1000 mg/200 mL premix        1,000 mg 200 mL/hr over 60 Minutes Intravenous Every 48 hours 10/14/11 0950     10/13/11 2000   vancomycin (VANCOCIN) IVPB 1000 mg/200 mL premix  Status:  Discontinued        1,000 mg 200 mL/hr over 60 Minutes Intravenous Every 24 hours 10/13/11 1812 10/14/11 0931   10/13/11 1800   cefTRIAXone (ROCEPHIN) 1 g in dextrose 5 % 50 mL IVPB        1 g 100 mL/hr over 30 Minutes Intravenous Every 24 hours 10/13/11 1725     10/13/11 1730   vancomycin (VANCOCIN) IVPB 1000 mg/200  mL premix  Status:  Discontinued        1,000 mg 200 mL/hr over 60 Minutes Intravenous Every 12 hours 10/13/11 1725 10/13/11 1737          Assessment: Pt admitted with worsening cellulitis of RLE  and drainage from harvest  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   Vancomycin level  ordered by MD, resulted in trough of 11.5, within desired range.   Goal of Therapy:  Vancomycin trough level 10-15 mcg/ml  Plan:  1.Would continue 1g IV q 48 hours as outpatient 2.Recommend checking scr and vancomycin trough weekly  [name] 10/18/2011,1:40 PM

## 2011-10-18 NOTE — Progress Notes (Signed)
Pt ambulated with rolling walker approx 250 feet. Tolerated well. Became SOB, placed pt on 2L O2 [place] when arrived back to room. Will encourage more walks today.

## 2011-10-18 NOTE — Progress Notes (Signed)
Changed pt's dressing on rt leg. Wet-dry dressing. Pt tolerated well. No pain. Minimal serosanguineous fluid noted on old dressing.

## 2011-10-18 NOTE — Discharge Summary (Signed)
Physician Discharge Summary  Patient ID: [name] MRN: [id number] DOB/AGE: 76-Jul-1922 76 y.o.  Admit date: 10/13/2011 Discharge date: 10/19/2011  Admission Diagnoses:  Patient Active Problem List  Diagnoses  . CAD, multiple vessel, severe disease, no stents placed only angioplasty to mid RCA.   Marland Kitchen Hypertension, uncontrolled on arrival 07/30/11  . Dyslipidemia  . Hypothyroidism  . Renal insufficiency  . Diabetes type 2, controlled  . History of gout  . Drug reaction  . Episodic lightheadedness, with near syncope X 3  . Racing heart beat, episodically  . Chest pain at rest, possible cardiac.  . S/P CABG x 3,LIMA - sequential prox-mid LAD, SVG-OM, SVG- RPDA, 08/05/11  . Paroxysmal atrial fibrillation, post CABG  . Anemia associated with acute blood loss, related to CABG  . Cellulitis and abscess of leg, except foot     Discharge Diagnoses:   Patient Active Problem List  Diagnoses  . CAD, multiple vessel, severe disease, no stents placed only angioplasty to mid RCA.   Marland Kitchen Hypertension, uncontrolled on arrival 07/30/11  . Dyslipidemia  . Hypothyroidism  . Renal insufficiency  . Diabetes type 2, controlled  . History of gout  . Drug reaction  . Episodic lightheadedness, with near syncope X 3  . Racing heart beat, episodically  . Chest pain at rest, possible cardiac.  . S/P CABG x 3,LIMA - sequential prox-mid LAD, SVG-OM, SVG- RPDA, 08/05/11  . Paroxysmal atrial fibrillation, post CABG  . Anemia associated with acute blood loss, related to CABG  . Cellulitis and abscess of leg, except foot    Discharged Condition: good  Hospital Course:   [name] is an 78 yo African American female well known to [facility].  She is a patient of Dr. Tyrone Sage who underwent CABG x4 performed in January 2013.  She underwent endoscopic vein harvest of her right and left thigh which initially healed well.  However, at her initial post operative appointment on 08/24/2011 her RLE incision had  dehisced with no evidence of infection.  The wound was cleaned and sutured close in sterile fashion.  The patient presented one week later for a wound check at which time there was concern the wound was developing some cellulitis.  She was given a prescription for Keflex and instructed to follow up in a week.  At that time the sutures previously placed were removed.  It was not felt that there was an active cellulitis infection at that time. She was instructed to pack her wound wet to dry daily. The patient again presented to the office for a wound check on 09/20/2011 at which point she was felt to have active RLE cellulitis.  Dr. Dorris Fetch evaluated the patient and felt she would require hospitalization.  Upon arrival Dr. Tyrone Sage evaluated the patients wound and she was taken to the OR for wound debridement with wound vac placement.  Wound cultures obtained revealed Proteus Mirabilis as the source of infection.   She was discharged home with wound care and Cipro.  Initially the patient was doing well after discharge.  She was evaluated in clinic on 10/03/2011 at which time the wound appeared to be clean and dry.  The patient was still completing her regimen of Cipro at that time.  However, the patient was sent to our clinic by her home wound care nurse on 10/13/2011 for possible cellulitis.  The patient stated the nurse felt the wound was not improving and had become worse.  Dr. Edwyna Shell evaluated the patient and felt  that she again had developed cellulitis in her RLE and marked LE peripheral edema.  He recommend the patient being admitted for IV antibiotics.  Upon arrival to the hospital the patient was evaluated.  Her RLE wounds had again opened with drainage present.  Her LE were also markedly edematous.  A wound culture was obtained and she was placed on Vancomycin and Rocephin IV per previous wound culture results.  She was also placed on Lasix to help with LE edema.  HD #1 the patient underwent LE doppler  studies which ruled out the presence of a DVT.  HD #3 the patients wound culture came pack positive for MRSA.  The sensitivities confirmed Vancomycin was an appropriate agent as well as Rocephin and both agents were continued.  HD # 5 the patient is doing well.  Her LE edema has improved.  Her LE wounds have also improved.  There is minimal erythema present and no purulent drainage observed.  The patient will require 14 days of IV Vancomycin which we will arrange for home administration.  It is also extremely important to keep the patients LE edema under control.  The patient has been instructed on the importance of keeping her LE elevated while at rest, and the importance of ambulating multiple times per day.  Should no problems arise we will aim for discharge home in the next 24-48 hours.   Disposition: 06-Home-Health Care Svc  Discharge Orders    Future Appointments: Provider: Department: Dept Phone: Center:   10/31/2011 3:00 PM [facility] [phone] [facility]     Future Orders Please Complete By Expires   Discharge instructions      Comments:   Please keep RLE elevated when at rest    Discharge instructions      Comments:   Patient must walk minimum 3 times per day, can use walker if needs assist     Medication List  As of 10/19/2011  7:59 AM   TAKE these medications         acetaminophen 325 MG tablet   Commonly known as: TYLENOL   Take 650 mg by mouth every 6 (six) hours as needed. For pain      allopurinol 100 MG tablet   Commonly known as: ZYLOPRIM   Take 100 mg by mouth daily.      amiodarone 400 MG tablet   Commonly known as: PACERONE   Take 200 mg by mouth daily.      aspirin 81 MG tablet   Take 81 mg by mouth daily.      carvedilol 6.25 MG tablet   Commonly known as: COREG   Take 6.25 mg by mouth 2 (two) times daily with a meal.      colesevelam 625 MG tablet   Commonly known as: WELCHOL   Take 3 tablets (1,875 mg total) by mouth 2 (two) times  daily with a meal.      ezetimibe 10 MG tablet   Commonly known as: ZETIA   Take 1 tablet (10 mg total) by mouth daily.      feeding supplement Liqd   Take 30 mLs by mouth 3 (three) times daily with meals.      folic acid 1 MG tablet   Commonly known as: FOLVITE   Take 1 tablet (1 mg total) by mouth daily.      furosemide 20 MG tablet   Commonly known as: LASIX   Take 80 mg by mouth 2 (two) times daily.  glimepiride 2 MG tablet   Commonly known as: AMARYL   Take 2 mg by mouth daily with breakfast.      levothyroxine 100 MCG tablet   Commonly known as: SYNTHROID, LEVOTHROID   Take 100 mcg by mouth daily.      LORazepam 0.5 MG tablet   Commonly known as: ATIVAN   Take 0.5 mg by mouth every 8 (eight) hours as needed. For anxiety      oxyCODONE 5 MG immediate release tablet   Commonly known as: Oxy IR/ROXICODONE   Take 1 tablet (5 mg total) by mouth every 4 (four) hours as needed.      pantoprazole 40 MG tablet   Commonly known as: PROTONIX   Take 1 tablet (40 mg total) by mouth daily at 12 noon.      sodium bicarbonate 650 MG tablet   Take 650 mg by mouth daily.           Follow-up Information    Follow up with GERHARDT,EDWARD B, MD in 1 week.   Contact information:   [address] [place] [address] [phone]          Signed: Lowella Dandy 10/19/2011, 7:59 AM

## 2011-10-18 NOTE — Progress Notes (Signed)
   CARE MANAGEMENT NOTE 10/18/2011  Patient:  [name], [name]   Account Number:  [id number]  Date Initiated:  10/14/2011  Documentation initiated by:  Liddie Chichester  Subjective/Objective Assessment:   PT READMITTED WITH CELLULITIS OF RT LEG; PTA, PT LIVES AT HOME WITH DAUGHTER AND IS ACTIVE WITH [facility] FOR [facility] CARE NEEDS.     Action/Plan:   NOTIFIED [facility] OF PT ADMISSION.  WILL CONT TO FOLLOW.  PT WILL NEED RESUMPTION OF CARE ORDERS PRIOR TO DC HOME TO CONT HH CARE.   Anticipated DC Date:  10/18/2011   Anticipated DC Plan:  HOME W HOME HEALTH SERVICES      DC Planning Services  CM consult      [facility] Choice  Resumption Of Svcs/PTA Provider   Choice offered to / List presented to:  C-1 Patient   DME arranged  IV PUMP/EQUIPMENT        HH arranged  HH-1 RN      [facility] agency  [facility]   Status of service:  In process, will continue to follow Medicare Important Message given?   (If response is "NO", the following Medicare IM given date fields will be blank) Date Medicare IM given:   Date Additional Medicare IM given:    Discharge Disposition:    Per UR Regulation:    If discussed at Long Length of Stay Meetings, dates discussed:    Comments:  10/18/11 Munira Polson,RN,BSN PT WILL NEED 2 WEEKS OF HOME IV VANCOMYCIN AT DC; [facility] AWARE OF NEED FOR HOME IV ABX AT DC.  WILL NEED RX FOR IV VANCOMYCIN FOR HOME HEALTH PHARMACY. Phone #[phone]

## 2011-10-19 LAB — GLUCOSE, CAPILLARY
Glucose-Capillary: 61 mg/dL — ABNORMAL LOW (ref 70–99)
Glucose-Capillary: 89 mg/dL (ref 70–99)
Glucose-Capillary: 90 mg/dL (ref 70–99)

## 2011-10-19 MED ORDER — HEPARIN SOD (PORK) LOCK FLUSH 100 UNIT/ML IV SOLN
250.0000 [IU] | Freq: Every day | INTRAVENOUS | Status: DC
  Administered 2011-10-19: 250 [IU]
  Filled 2011-10-19: qty 3

## 2011-10-19 MED ORDER — VANCOMYCIN HCL IN DEXTROSE 1-5 GM/200ML-% IV SOLN: 1000.0000 mg | INTRAVENOUS | Status: DC

## 2011-10-19 MED ORDER — HEPARIN SOD (PORK) LOCK FLUSH 100 UNIT/ML IV SOLN
250.0000 [IU] | INTRAVENOUS | Status: DC | PRN
  Administered 2011-10-19: 250 [IU]
  Filled 2011-10-19: qty 3

## 2011-10-19 NOTE — Progress Notes (Signed)
Changed pt's dressing. Wet-dry. Small amount of serosanguenous fluid left on old dressing. Pt denied any pain. Pt aware that [facility] RN will be changing dressing. Educated pt about keeping leg elevated.

## 2011-10-19 NOTE — Progress Notes (Signed)
Nutrition Follow-up  Pt states she's eating well. PO intake 50-100% per flowsheet records. Receiving Prostat liquid protein 30 ml PO TID with meals. Noted plan for discharge today.  Diet Order:  Carbohydrate Modified Medium Calorie  Meds: Scheduled Meds:   . allopurinol  100 mg Oral Daily  . amiodarone  200 mg Oral Daily  . antiseptic oral rinse  15 mL Mouth Rinse BID  . aspirin EC  325 mg Oral Daily  . carvedilol  6.25 mg Oral BID WC  . cefTRIAXone (ROCEPHIN) IVPB 1 gram/50 mL D5W  1 g Intravenous Q24H  . colesevelam  1,875 mg Oral BID WC  . docusate sodium  100 mg Oral BID  . enoxaparin  30 mg Subcutaneous Q24H  . ezetimibe  10 mg Oral Daily  . feeding supplement  30 mL Oral TID WC  . furosemide  80 mg Oral BID  . glimepiride  2 mg Oral Q breakfast  . levothyroxine  100 mcg Oral QAC breakfast  . pantoprazole  40 mg Oral Q1200  . sodium bicarbonate  650 mg Oral Daily  . sodium chloride  3 mL Intravenous Q12H  . vancomycin  1,000 mg Intravenous Q48H   Continuous Infusions:  PRN Meds:.sodium chloride, acetaminophen, acetaminophen, ondansetron (ZOFRAN) IV, ondansetron, oxyCODONE, sodium chloride, sodium chloride  Labs:  CMP     Component Value Date/Time   NA 138 10/17/2011 0620   K 3.5 10/17/2011 0620   CL 99 10/17/2011 0620   CO2 31 10/17/2011 0620   GLUCOSE 85 10/17/2011 0620   BUN 25* 10/17/2011 0620   CREATININE 1.45* 10/17/2011 0620   CREATININE 1.57* 08/29/2011 1553   CALCIUM 9.8 10/17/2011 0620   PROT 6.9 07/30/2011 1152   ALBUMIN 3.6 07/30/2011 1152   AST 39* 07/30/2011 1152   ALT 44* 07/30/2011 1152   ALKPHOS 135* 07/30/2011 1152   BILITOT 0.3 07/30/2011 1152   GFRNONAA 31* 10/17/2011 0620   GFRAA 36* 10/17/2011 0620     Intake/Output Summary (Last 24 hours) at 10/19/11 1028 Last data filed at 10/19/11 0700  Gross per 24 hour  Intake    840 ml  Output      0 ml  Net    840 ml    CBG (last 3)   Basename 10/19/11 0651 10/19/11 0625 10/18/11 2131  GLUCAP 89 61* 95     Weight Status:  86.0 kg (4/10) -- trending down  Re-estimated needs:  1600-1800 kcals, 90-100 gm protein  Nutrition Dx:  Increased nutrient needs, ongoing Monitor: PO intake, labs, weight, I/O's  Goal:  Meet >90% of estimated nutrition needs, progressing  Intervention:    Continue Prostat liquid protein 30 ml PO TID  RD to follow for nutrition care plan   Alger Memos Pager #:  [phone]

## 2011-10-19 NOTE — Progress Notes (Signed)
Subjective:   [name] has no complaints this morning.  Objective:  Vital Signs in the last 24 hours: Temp:  [97.3 F (36.3 C)-97.5 F (36.4 C)] 97.5 F (36.4 C) (04/10 0355) Pulse Rate:  [61-65] 65  (04/10 0355) Resp:  [18-20] 20  (04/10 0355) BP: (126-158)/(49-60) 140/60 mmHg (04/10 0355) SpO2:  [100 %] 100 % (04/10 0355) Weight:  [189 lb 12.8 oz (86.093 kg)] 189 lb 12.8 oz (86.093 kg) (04/10 0355)  Intake/Output from previous day: 04/09 0701 - 04/10 0700 In: 600 [P.O.:600] Out: -  Intake/Output from this shift:    Physical Exam: General appearance: alert, appears stated age and no distress Lungs: clear to auscultation bilaterally Heart: regular rate and rhythm Abdomen: soft, non-tender; bowel sounds normal; no masses,  no organomegaly Extremities: edema 2+ Skin: RLE open incisions appear clean with minimal erythema or drainage present Neurologic: Grossly normal  Lab Results:  Basename 10/17/11 0620  WBC 6.1  HGB 9.8*  PLT 241    Basename 10/17/11 0620  NA 138  K 3.5  CL 99  CO2 31  GLUCOSE 85  BUN 25*  CREATININE 1.45*   No results found for this basename: TROPONINI:2,CK,MB:2 in the last 72 hours Hepatic Function Panel No results found for this basename: PROT,ALBUMIN,AST,ALT,ALKPHOS,BILITOT,BILIDIR,IBILI in the last 72 hours No results found for this basename: CHOL in the last 72 hours No results found for this basename: PROTIME in the last 72 hours  Assessment/Plan:   1. RLE Cellulitis- +MRSA, patient will continue IV Vancomycin for 2 more weeks of therapy 2. LE edema- improving, will continue Lasix, stressed to patient while at home the importance to elevate her LE while at rest 3. Deconditioning- patient is ambulating with use of walker, she already has one at home 4. DIspo- patient will be discharged home today.  She will continue Vancomycin for 2 more weeks of therapy.  Home health will need to check a Vanc level every 4 doses.  Wound care will  also need to be provided for her wound  LOS: 6 days    Miyoko Hashimi 10/19/2011, 7:52 AM

## 2011-10-19 NOTE — Progress Notes (Signed)
Discharge instructions given to pt along with prescriptions. Pt educated about dressing changes and the RN will be doing these. Pt also aware that she will be receiving IV Vanc while at home. Pt knows to keep leg elevated. Pt ready for discharge

## 2011-10-21 NOTE — Progress Notes (Signed)
UR Completed.  [name] Jane [phone] 10/21/2011  

## 2011-10-21 NOTE — Progress Notes (Signed)
   CARE MANAGEMENT NOTE 10/21/2011  Patient:  [name], [name]   Account Number:  [id number]  Date Initiated:  10/14/2011  Documentation initiated by:  Jeymi Hepp  Subjective/Objective Assessment:   PT READMITTED WITH CELLULITIS OF RT LEG; PTA, PT LIVES AT HOME WITH DAUGHTER AND IS ACTIVE WITH [facility] FOR HOME CARE NEEDS.     Action/Plan:   NOTIFIED [facility] OF PT ADMISSION.  WILL CONT TO FOLLOW.  PT WILL NEED RESUMPTION OF CARE ORDERS PRIOR TO DC HOME TO CONT HH CARE.   Anticipated DC Date:  10/18/2011   Anticipated DC Plan:  HOME W HOME HEALTH SERVICES      DC Planning Services  CM consult      [facility] Choice  Resumption Of Svcs/PTA Provider   Choice offered to / List presented to:  C-1 Patient   DME arranged  IV PUMP/EQUIPMENT        HH arranged  HH-1 RN      [facility] agency  [facility]   Status of service:  Completed, signed off Medicare Important Message given?   (If response is "NO", the following Medicare IM given date fields will be blank) Date Medicare IM given:   Date Additional Medicare IM given:    Discharge Disposition:  HOME W HOME HEALTH SERVICES  Per UR Regulation:    If discussed at Long Length of Stay Meetings, dates discussed:    Comments:  10/19/11 Lizann Edelman,RN,BSN PT FOR DISCHARGE HOME TODAY WITH FAMILY AND HH SERVICES AS ARRANGED.  START OF CARE WITHIN 24HRS OF DC. Phone #[phone]   10/18/11 Draven Laine,RN,BSN PT WILL NEED 2 WEEKS OF HOME IV VANCOMYCIN AT DC; [facility] AWARE OF NEED FOR HOME IV ABX AT DC.  WILL NEED RX FOR IV VANCOMYCIN FOR HOME HEALTH PHARMACY. Phone #[phone]

## 2011-10-22 NOTE — H&P (Signed)
patient examined and medical record reviewed,agree with above note. VAN TRIGT [name],Oakley Kossman 10/22/2011   

## 2011-10-22 NOTE — Discharge Summary (Signed)
patient examined and medical record reviewed,agree with above note. VAN TRIGT [name],Royale Lennartz 10/22/2011   

## 2011-10-23 ENCOUNTER — Other Ambulatory Visit: Payer: Self-pay | Admitting: Physician Assistant

## 2011-10-31 ENCOUNTER — Ambulatory Visit (INDEPENDENT_AMBULATORY_CARE_PROVIDER_SITE_OTHER): Payer: Medicare Other | Admitting: Physician Assistant

## 2011-10-31 VITALS — BP 157/68 | HR 60 | Temp 95.2°F | Resp 20

## 2011-10-31 DIAGNOSIS — I2510 Atherosclerotic heart disease of native coronary artery without angina pectoris: Secondary | ICD-10-CM

## 2011-10-31 DIAGNOSIS — Z951 Presence of aortocoronary bypass graft: Secondary | ICD-10-CM

## 2011-10-31 DIAGNOSIS — L02419 Cutaneous abscess of limb, unspecified: Secondary | ICD-10-CM

## 2011-10-31 DIAGNOSIS — L03119 Cellulitis of unspecified part of limb: Secondary | ICD-10-CM

## 2011-10-31 DIAGNOSIS — T07XXXA Unspecified multiple injuries, initial encounter: Secondary | ICD-10-CM

## 2011-10-31 NOTE — Progress Notes (Signed)
[address]            [name] [address]          [phone]     HPI: Patient returns for recheck of her right lower extremity.  She is status post CABG in January by Dr. Tyrone Sage and later developed cellulitis of the right leg, which required readmission and operative debridement.  She was discharged home with a wound VAC.  She recently presented to our office for recheck and was noted to have recurrent cellulitis.  She was admitted for IV antibiotic therapy and was started on wet to dry dressing changes.  Cultures were positive for MRSA.  She improved and was discharged home on IV Vancomycin for 14 days.  Since hospital discharge the patient has shown continued improvement.  Her wounds have completely granulated and she has 1 remaining dose of antibiotics.  Her edema is significantly better. She is walking without difficulty.   Current Outpatient Prescriptions  Medication Sig Dispense Refill  . acetaminophen (TYLENOL) 325 MG tablet Take 650 mg by mouth every 6 (six) hours as needed. For pain      . allopurinol (ZYLOPRIM) 100 MG tablet Take 100 mg by mouth daily.       Marland Kitchen amiodarone (PACERONE) 400 MG tablet Take 200 mg by mouth daily.      Marland Kitchen aspirin 81 MG tablet Take 81 mg by mouth daily.        . carvedilol (COREG) 6.25 MG tablet Take 6.25 mg by mouth 2 (two) times daily with a meal.      . colesevelam (WELCHOL) 625 MG tablet Take 3 tablets (1,875 mg total) by mouth 2 (two) times daily with a meal.  180 tablet  5  . ezetimibe (ZETIA) 10 MG tablet Take 1 tablet (10 mg total) by mouth daily.  30 tablet  5  . feeding supplement (PRO-STAT SUGAR FREE 64) LIQD Take 30 mLs by mouth 3 (three) times daily with meals.  900 mL  1  . folic acid (FOLVITE) 1 MG tablet Take 1 tablet (1 mg total) by mouth daily.  30 tablet  1  . furosemide (LASIX) 20 MG tablet Take 80 mg by mouth 2 (two) times daily.       Marland Kitchen glimepiride (AMARYL) 2 MG tablet Take 2 mg by mouth daily  with breakfast.      . levothyroxine (SYNTHROID, LEVOTHROID) 100 MCG tablet Take 100 mcg by mouth daily.        Marland Kitchen LORazepam (ATIVAN) 0.5 MG tablet Take 0.5 mg by mouth every 8 (eight) hours as needed. For anxiety      . oxycodone (OXY-IR) 5 MG capsule Take 5 mg by mouth every 4 (four) hours as needed.      . pantoprazole (PROTONIX) 40 MG tablet Take 1 tablet (40 mg total) by mouth daily at 12 noon.  30 tablet  5  . sodium bicarbonate 650 MG tablet Take 650 mg by mouth daily.        . vancomycin (VANCOCIN) 1 GM/200ML SOLN Inject 200 mLs (1,000 mg total) into the vein every other day. Patient needs IV Vancomycin for 2 weeks then may stop.  4000 mL  1  . DISCONTD: glimepiride (AMARYL) 2 MG tablet Take 1 tablet (2 mg total) by mouth daily with breakfast.        Physical Exam: BP 157/68  HR 60 Resp 20 Wounds: Sternal wound has healed well.  Both right lower extremity incisions have completely granulated at this point and there is no erythema. Heart: RRR Lungs: clear Extremities: mild RLE edema, significantly improved from previous exams     Assessment/Plan: Dr. Tyrone Sage also saw the patient today, and she is doing well.  She will complete her last dose of Vanc and we will have the home health nurse remove her PICC line.  She may also decrease her Lasix to once daily.  She should follow up as soon as possible with her cardiologist.  Dr. Tyrone Sage  will see her back in 2-3 weeks for wound recheck.

## 2011-11-03 ENCOUNTER — Encounter: Payer: Self-pay | Admitting: Cardiothoracic Surgery

## 2011-11-08 ENCOUNTER — Emergency Department (HOSPITAL_COMMUNITY): Payer: Medicare Other

## 2011-11-08 ENCOUNTER — Encounter (HOSPITAL_COMMUNITY): Payer: Self-pay | Admitting: Emergency Medicine

## 2011-11-08 ENCOUNTER — Emergency Department (HOSPITAL_COMMUNITY)
Admission: EM | Admit: 2011-11-08 | Discharge: 2011-11-08 | Disposition: A | Discharge status: Home / Self Care | Payer: Medicare Other | Attending: Emergency Medicine | Admitting: Emergency Medicine

## 2011-11-08 DIAGNOSIS — Z79899 Other long term (current) drug therapy: Secondary | ICD-10-CM | POA: Insufficient documentation

## 2011-11-08 DIAGNOSIS — S0101XA Laceration without foreign body of scalp, initial encounter: Secondary | ICD-10-CM

## 2011-11-08 DIAGNOSIS — E119 Type 2 diabetes mellitus without complications: Secondary | ICD-10-CM | POA: Insufficient documentation

## 2011-11-08 DIAGNOSIS — E039 Hypothyroidism, unspecified: Secondary | ICD-10-CM | POA: Insufficient documentation

## 2011-11-08 DIAGNOSIS — I129 Hypertensive chronic kidney disease with stage 1 through stage 4 chronic kidney disease, or unspecified chronic kidney disease: Secondary | ICD-10-CM | POA: Insufficient documentation

## 2011-11-08 DIAGNOSIS — Z7982 Long term (current) use of aspirin: Secondary | ICD-10-CM | POA: Insufficient documentation

## 2011-11-08 DIAGNOSIS — I252 Old myocardial infarction: Secondary | ICD-10-CM | POA: Insufficient documentation

## 2011-11-08 DIAGNOSIS — W010XXA Fall on same level from slipping, tripping and stumbling without subsequent striking against object, initial encounter: Secondary | ICD-10-CM | POA: Insufficient documentation

## 2011-11-08 DIAGNOSIS — I2510 Atherosclerotic heart disease of native coronary artery without angina pectoris: Secondary | ICD-10-CM | POA: Insufficient documentation

## 2011-11-08 DIAGNOSIS — N189 Chronic kidney disease, unspecified: Secondary | ICD-10-CM | POA: Insufficient documentation

## 2011-11-08 DIAGNOSIS — I4891 Unspecified atrial fibrillation: Secondary | ICD-10-CM | POA: Insufficient documentation

## 2011-11-08 DIAGNOSIS — Y92009 Unspecified place in unspecified non-institutional (private) residence as the place of occurrence of the external cause: Secondary | ICD-10-CM | POA: Insufficient documentation

## 2011-11-08 DIAGNOSIS — S0100XA Unspecified open wound of scalp, initial encounter: Secondary | ICD-10-CM | POA: Insufficient documentation

## 2011-11-08 DIAGNOSIS — M129 Arthropathy, unspecified: Secondary | ICD-10-CM | POA: Insufficient documentation

## 2011-11-08 DIAGNOSIS — W19XXXA Unspecified fall, initial encounter: Secondary | ICD-10-CM

## 2011-11-08 DIAGNOSIS — R51 Headache: Secondary | ICD-10-CM | POA: Insufficient documentation

## 2011-11-08 LAB — CBC
MCH: 22.5 pg — ABNORMAL LOW (ref 26.0–34.0)
MCHC: 30.5 g/dL (ref 30.0–36.0)
Platelets: 207 10*3/uL (ref 150–400)
RDW: 17.8 % — ABNORMAL HIGH (ref 11.5–15.5)

## 2011-11-08 LAB — BASIC METABOLIC PANEL
Calcium: 10.1 mg/dL (ref 8.4–10.5)
GFR calc non Af Amer: 27 mL/min — ABNORMAL LOW (ref >90)
Sodium: 140 mEq/L (ref 135–145)

## 2011-11-08 MED ORDER — LIDOCAINE-EPINEPHRINE (PF) 2 %-1:200000 IJ SOLN
20.0000 mL | Freq: Once | INTRAMUSCULAR | Status: AC
  Administered 2011-11-08: 20 mL

## 2011-11-08 MED ORDER — TETANUS-DIPHTHERIA TOXOIDS TD 5-2 LFU IM INJ
0.5000 mL | INJECTION | Freq: Once | INTRAMUSCULAR | Status: AC
  Administered 2011-11-08: 0.5 mL via INTRAMUSCULAR
  Filled 2011-11-08: qty 0.5

## 2011-11-08 NOTE — ED Provider Notes (Addendum)
History     CSN: [id number]  Arrival date & time 11/08/11  1019   First MD Initiated Contact with Patient 11/08/11 1023      Chief Complaint  Patient presents with  . Fall  . Head Laceration    (Consider location/radiation/quality/duration/timing/severity/associated sxs/prior treatment) HPI Comments: Patient was in the kitchen this morning starting to fix breakfast and when she turned around she lost her balance and fell down.  She does not know what she hit her head on.  She notes a laceration to her posterior head.  She denies pain elsewhere.  Denies that she had chest pain, shortness of breath, palpitations, lightheadedness or dizziness.  She has no weakness or numbness now.  She notes the cut on her head but denies specific headache or other pain.  She is on a baby aspirin daily but no other antiplatelet or anticoagulation agents.  Patient is a 78 y.o. female presenting with fall and scalp laceration. The history is provided by the patient. No language interpreter was used.  Fall The accident occurred less than 1 hour ago. The fall occurred while walking. She landed on a hard floor. The volume of blood lost was moderate. The point of impact was the head. The pain is present in the head. The pain is mild. She was ambulatory at the scene. There was no entrapment after the fall. There was no drug use involved in the accident. There was no alcohol use involved in the accident. Pertinent negatives include no visual change, no fever, no numbness, no abdominal pain, no bowel incontinence, no nausea, no vomiting, no headaches, no hearing loss, no loss of consciousness and no tingling.  Head Laceration Pertinent negatives include no chest pain, no abdominal pain, no headaches and no shortness of breath.    Past Medical History  Diagnosis Date  . Diabetes mellitus   . Hypertension   . PONV (postoperative nausea and vomiting)   . Angina   . Shortness of breath   . Chronic kidney disease   .  Hypothyroidism   . Headache   . Arthritis   . Anxiety   . Myocardial infarction   . Pneumonia   . Episodic lightheadedness, with near syncope X 3 07/30/2011  . Coronary artery disease   . 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 Paroxysmal atrial fibrillation, post CABG 08/10/2011  . Anemia associated with acute blood loss, related to CABG 08/10/2011    Past Surgical History  Procedure Date  . Cardiac catheterization   . Coronary angioplasty   . Coronary artery bypass graft 08/05/2011    Procedure: CORONARY ARTERY BYPASS GRAFTING (CABG);  Surgeon: [name], MD;  Location: [facility] OR;  Service: Open Heart Surgery;  Laterality: N/A;  coronary artery bypass graft times 4 using left internal mammary artery and right leg saphenous vein harvested endoscopically  . Joint replacement     both hips replaced  . I&d extremity 09/21/2011    Procedure: IRRIGATION AND DEBRIDEMENT EXTREMITY;  Surgeon: [name], MD;  Location: [facility] OR;  Service: Vascular;  Laterality: Right;  with wound vac placement    Family History  Problem Relation Age of Onset  . Breast cancer Mother   . Heart disease Father     History  Substance Use Topics  . Smoking status: Never Smoker   . Smokeless tobacco: Never Used  . Alcohol Use: No    OB History  Grav Para Term Preterm Abortions TAB SAB Ect Mult Living                  Review of Systems  Constitutional: Negative.  Negative for fever and chills.  Eyes: Negative.  Negative for discharge and redness.  Respiratory: Negative.  Negative for cough and shortness of breath.   Cardiovascular: Negative.  Negative for chest pain.  Gastrointestinal: Negative.  Negative for nausea, vomiting, abdominal pain, diarrhea and bowel incontinence.  Genitourinary: Negative.  Negative for dysuria.  Musculoskeletal: Negative.  Negative for back pain.  Skin: Positive for wound. Negative for color change and rash.  Neurological:  Negative.  Negative for tingling, loss of consciousness, syncope, numbness and headaches.  Hematological: Negative.  Negative for adenopathy.  Psychiatric/Behavioral: Negative.  Negative for confusion.  All other systems reviewed and are negative.    Allergies  Statins  Home Medications   Current Outpatient Rx  Name Route Sig Dispense Refill  . ACETAMINOPHEN 325 MG PO TABS Oral Take 650 mg by mouth every 6 (six) hours as needed. For pain    . ALLOPURINOL 100 MG PO TABS Oral Take 100 mg by mouth daily.     . AMIODARONE HCL 400 MG PO TABS Oral Take 200 mg by mouth daily.    . ASPIRIN 81 MG PO TABS Oral Take 81 mg by mouth daily.      Marland Kitchen CARVEDILOL 6.25 MG PO TABS Oral Take 6.25 mg by mouth 2 (two) times daily with a meal.    . COLESEVELAM HCL 625 MG PO TABS Oral Take 3 tablets (1,875 mg total) by mouth 2 (two) times daily with a meal. 180 tablet 5  . EZETIMIBE 10 MG PO TABS Oral Take 1 tablet (10 mg total) by mouth daily. 30 tablet 5  . PRO-STAT 64 PO LIQD Oral Take 30 mLs by mouth 3 (three) times daily with meals. 900 mL 1  . FOLIC ACID 1 MG PO TABS Oral Take 1 tablet (1 mg total) by mouth daily. 30 tablet 1  . FUROSEMIDE 20 MG PO TABS Oral Take 80 mg by mouth 2 (two) times daily.     Marland Kitchen GLIMEPIRIDE 2 MG PO TABS Oral Take 2 mg by mouth daily with breakfast.    . LEVOTHYROXINE SODIUM 100 MCG PO TABS Oral Take 100 mcg by mouth daily.      Marland Kitchen LORAZEPAM 0.5 MG PO TABS Oral Take 0.5 mg by mouth every 8 (eight) hours as needed. For anxiety    . OXYCODONE HCL 5 MG PO CAPS Oral Take 5 mg by mouth every 4 (four) hours as needed.    Marland Kitchen PANTOPRAZOLE SODIUM 40 MG PO TBEC Oral Take 1 tablet (40 mg total) by mouth daily at 12 noon. 30 tablet 5  . SODIUM BICARBONATE 650 MG PO TABS Oral Take 650 mg by mouth daily.      Marland Kitchen VANCOMYCIN HCL IN DEXTROSE 1 GM/200ML IV SOLN Intravenous Inject 200 mLs (1,000 mg total) into the vein every other day. Patient needs IV Vancomycin for 2 weeks then may stop. 4000 mL 1     BP 139/75  Pulse 61  Resp 18  SpO2 95%  Physical Exam  Nursing note and vitals reviewed. Constitutional: She is oriented to person, place, and time. She appears well-developed and well-nourished.  Non-toxic appearance. She does not have a sickly appearance.  HENT:       Posterior right occiput demonstrates a scalp laceration of approximately 4.5 cm  Eyes: Conjunctivae, EOM and lids are normal. Pupils are equal, round, and reactive to light. No scleral icterus.  Neck: Trachea normal and normal range of motion. Neck supple.  Cardiovascular: Normal rate, regular rhythm and normal heart sounds.  Exam reveals no gallop and no friction rub.   No murmur heard. Pulmonary/Chest: Effort normal and breath sounds normal. No respiratory distress. She has no wheezes. She has no rales.  Abdominal: Soft. Normal appearance. There is no tenderness. There is no rebound, no guarding and no CVA tenderness.  Musculoskeletal: Normal range of motion.       Pelvis is stable.  No tenderness to palpation over her shoulders or upper extremities.  No tenderness to palpation over her legs.  No focal tenderness to palpation of her C-spine  Neurological: She is alert and oriented to person, place, and time. She has normal strength.  Skin: Skin is warm, dry and intact. No rash noted.  Psychiatric: She has a normal mood and affect. Her behavior is normal. Judgment and thought content normal.    ED Course  LACERATION REPAIR Date/Time: 11/08/2011 11:59 AM Performed by: Emeline General A Authorized by: Emeline General A Consent: Verbal consent obtained. Written consent not obtained. Risks and benefits: risks, benefits and alternatives were discussed Consent given by: patient Patient understanding: patient states understanding of the procedure being performed Patient identity confirmed: verbally with patient Body area: head/neck Location details: scalp Laceration length: 4.5 cm Foreign bodies: no foreign  bodies Tendon involvement: none Nerve involvement: none Vascular damage: no Anesthesia: local infiltration Local anesthetic: lidocaine 2% with epinephrine Anesthetic total: 10 ml Patient sedated: no Preparation: Patient was prepped and draped in the usual sterile fashion. Irrigation solution: saline Irrigation method: syringe Amount of cleaning: extensive Debridement: none Degree of undermining: none Skin closure: staples Number of sutures: 6 Approximation: close Approximation difficulty: simple Patient tolerance: Patient tolerated the procedure well with no immediate complications.   (including critical care time)  Results for orders placed during the hospital encounter of 11/08/11  CBC      Component Value Range   WBC 5.2  4.0 - 10.5 (K/uL)   RBC 4.58  3.87 - 5.11 (MIL/uL)   Hemoglobin 10.3 (*) 12.0 - 15.0 (g/dL)   HCT 16.1 (*) 09.6 - 46.0 (%)   MCV 73.8 (*) 78.0 - 100.0 (fL)   MCH 22.5 (*) 26.0 - 34.0 (pg)   MCHC 30.5  30.0 - 36.0 (g/dL)   RDW 04.5 (*) 40.9 - 15.5 (%)   Platelets 207  150 - 400 (K/uL)  BASIC METABOLIC PANEL      Component Value Range   Sodium 140  135 - 145 (mEq/L)   Potassium 4.1  3.5 - 5.1 (mEq/L)   Chloride 106  96 - 112 (mEq/L)   CO2 25  19 - 32 (mEq/L)   Glucose, Bld 97  70 - 99 (mg/dL)   BUN 21  6 - 23 (mg/dL)   Creatinine, Ser 8.11 (*) 0.50 - 1.10 (mg/dL)   Calcium 91.4  8.4 - 10.5 (mg/dL)   GFR calc non Af Amer 27 (*) >90 (mL/min)   GFR calc Af Amer 31 (*) >90 (mL/min)   Ct Head Wo Contrast  11/08/2011  *RADIOLOGY REPORT*  Clinical Data:  History of trauma from fall.  CT HEAD WITHOUT CONTRAST CT CERVICAL SPINE WITHOUT CONTRAST  Technique:  Multidetector CT imaging of the head and cervical spine was performed following the standard protocol without intravenous contrast.  Multiplanar CT image reconstructions of  the cervical spine were also generated.  Comparison:  No priors.  CT HEAD  Findings: There is a laceration to the posterior right  parietal scalp.  No underlying acute displaced skull fracture is identified. No acute intracranial abnormalities.  Specifically, no definite acute post-traumatic intracranial hemorrhage, no focal mass, mass effect, hydrocephalus or abnormal intra or extra-axial fluid collections.  Visualized paranasal sinuses and mastoids are well pneumatized, with exception of some mild mucosal thickening in the maxillary and ethmoid sinuses bilaterally.  IMPRESSION: 1.  Right parietal scalp laceration without evidence of underlying displaced skull fracture or acute intracranial abnormality. 2.  The appearance of the brain is normal.  CT CERVICAL SPINE  Findings: No acute displaced cervical spine fractures are identified.  There is some straightening of normal cervical lordosis related to advanced multilevel degenerative disc disease and facet arthropathy.  Degenerative disc disease is most severe at C5-C6 where there are extensive degenerative changes of the adjacent vertebral body endplates.  Facet arthropathy is most severe from C2-C4, particularly on the left side.  Prevertebral soft tissues are within normal limits.  Visualized portions of the lung apices are unremarkable.  IMPRESSION: 1.  Extensive multilevel degenerative disc disease and cervical spondylosis, as above, without evidence of significant acute traumatic injury to the cervical spine.  Original Report Authenticated By: Florencia Reasons, M.D.   Ct Cervical Spine Wo Contrast  11/08/2011  *RADIOLOGY REPORT*  Clinical Data:  History of trauma from fall.  CT HEAD WITHOUT CONTRAST CT CERVICAL SPINE WITHOUT CONTRAST  Technique:  Multidetector CT imaging of the head and cervical spine was performed following the standard protocol without intravenous contrast.  Multiplanar CT image reconstructions of the cervical spine were also generated.  Comparison:  No priors.  CT HEAD  Findings: There is a laceration to the posterior right parietal scalp.  No underlying acute  displaced skull fracture is identified. No acute intracranial abnormalities.  Specifically, no definite acute post-traumatic intracranial hemorrhage, no focal mass, mass effect, hydrocephalus or abnormal intra or extra-axial fluid collections.  Visualized paranasal sinuses and mastoids are well pneumatized, with exception of some mild mucosal thickening in the maxillary and ethmoid sinuses bilaterally.  IMPRESSION: 1.  Right parietal scalp laceration without evidence of underlying displaced skull fracture or acute intracranial abnormality. 2.  The appearance of the brain is normal.  CT CERVICAL SPINE  Findings: No acute displaced cervical spine fractures are identified.  There is some straightening of normal cervical lordosis related to advanced multilevel degenerative disc disease and facet arthropathy.  Degenerative disc disease is most severe at C5-C6 where there are extensive degenerative changes of the adjacent vertebral body endplates.  Facet arthropathy is most severe from C2-C4, particularly on the left side.  Prevertebral soft tissues are within normal limits.  Visualized portions of the lung apices are unremarkable.  IMPRESSION: 1.  Extensive multilevel degenerative disc disease and cervical spondylosis, as above, without evidence of significant acute traumatic injury to the cervical spine.  Original Report Authenticated By: Florencia Reasons, M.D.      Date: 11/08/2011  Rate: 67  Rhythm: normal sinus rhythm  QRS Axis: normal  Intervals: PR prolonged  ST/T Wave abnormalities: nonspecific T wave changes  Conduction Disutrbances:first-degree A-V block   Narrative Interpretation: Patient's T wave changes are flattened T waves diffusely.  Old EKG Reviewed: changes noted from 08/06/2011 when patient had no first-degree A-V block.   MDM  Patient with likely mechanical fall at home.  She denies loss of consciousness.  Given the patient's elevated age with the clear signs of scalp laceration and  hematoma I will obtain a CT head and a CT C-spine.  Patient will have some laboratory studies checked to evaluate her sugar since she is a diabetic and for anemia.  If her studies are normal I will repair the patient's laceration and I will anticipate the patient's discharge with followup with her primary care physician for staple removal.     Nat Christen, MD 11/08/11 1036  Nat Christen, MD 11/08/11 1055  Patient's laboratory studies are normal and her CT scan is normal at this time as well.  Patient has had her laceration repaired and I feel is not safe for discharge home.  She knows to have the staples removed since 10-14 days.  Nat Christen, MD 11/08/11 1200

## 2011-11-08 NOTE — ED Notes (Signed)
RUE:[id number]<JW> Expected date:11/08/11<BR> Expected time:10:13 AM<BR> Means of arrival:Ambulance<BR> Comments:<BR> 78yo/fall

## 2011-11-08 NOTE — ED Notes (Signed)
Per EMS.  Pt from home.  Pt was in kitchen when she went to turn around and fell down.  Pt hit head on the way down and has bleeding on the back of her head.  Pt was ambulatory when EMS arrived.  Denies LOC or neck/back pain.

## 2011-11-08 NOTE — Discharge Instructions (Signed)
Laceration Care, Adult A laceration is a cut or lesion that goes through all layers of the skin and into the tissue just beneath the skin. TREATMENT  Some lacerations may not require closure. Some lacerations may not be able to be closed due to an increased risk of infection. It is important to see your caregiver as soon as possible after an injury to minimize the risk of infection and maximize the opportunity for successful closure. If closure is appropriate, pain medicines may be given, if needed. The wound will be cleaned to help prevent infection. Your caregiver will use stitches (sutures), staples, wound glue (adhesive), or skin adhesive strips to repair the laceration. These tools bring the skin edges together to allow for faster healing and a better cosmetic outcome. However, all wounds will heal with a scar. Once the wound has healed, scarring can be minimized by covering the wound with sunscreen during the day for 1 full year. HOME CARE INSTRUCTIONS  For sutures or staples:  Keep the wound clean and dry.   If you were given a bandage (dressing), you should change it at least once a day. Also, change the dressing if it becomes wet or dirty, or as directed by your caregiver.   Wash the wound with soap and water 2 times a day. Rinse the wound off with water to remove all soap. Pat the wound dry with a clean towel.   After cleaning, apply a thin layer of the antibiotic ointment as recommended by your caregiver. This will help prevent infection and keep the dressing from sticking.   You may shower as usual after the first 24 hours. Do not soak the wound in water until the sutures are removed.   Only take over-the-counter or prescription medicines for pain, discomfort, or fever as directed by your caregiver.   Get your sutures or staples removed as directed by your caregiver.  For skin adhesive strips:  Keep the wound clean and dry.   Do not get the skin adhesive strips wet. You may bathe  carefully, using caution to keep the wound dry.   If the wound gets wet, pat it dry with a clean towel.   Skin adhesive strips will fall off on their own. You may trim the strips as the wound heals. Do not remove skin adhesive strips that are still stuck to the wound. They will fall off in time.  For wound adhesive:  You may briefly wet your wound in the shower or bath. Do not soak or scrub the wound. Do not swim. Avoid periods of heavy perspiration until the skin adhesive has fallen off on its own. After showering or bathing, gently pat the wound dry with a clean towel.   Do not apply liquid medicine, cream medicine, or ointment medicine to your wound while the skin adhesive is in place. This may loosen the film before your wound is healed.   If a dressing is placed over the wound, be careful not to apply tape directly over the skin adhesive. This may cause the adhesive to be pulled off before the wound is healed.   Avoid prolonged exposure to sunlight or tanning lamps while the skin adhesive is in place. Exposure to ultraviolet light in the first year will darken the scar.   The skin adhesive will usually remain in place for 5 to 10 days, then naturally fall off the skin. Do not pick at the adhesive film.  You may need a tetanus shot if:  You   cannot remember when you had your last tetanus shot.   You have never had a tetanus shot.  If you get a tetanus shot, your arm may swell, get red, and feel warm to the touch. This is common and not a problem. If you need a tetanus shot and you choose not to have one, there is a rare chance of getting tetanus. Sickness from tetanus can be serious. SEEK MEDICAL CARE IF:   You have redness, swelling, or increasing pain in the wound.   You see a red line that goes away from the wound.   You have yellowish-white fluid (pus) coming from the wound.   You have a fever.   You notice a bad smell coming from the wound or dressing.   Your wound breaks  open before or after sutures have been removed.   You notice something coming out of the wound such as wood or glass.   Your wound is on your hand or foot and you cannot move a finger or toe.  SEEK IMMEDIATE MEDICAL CARE IF:  Your pain is not controlled with prescribed medicStaple Care and Removal Your caregiver has used staples today to repair your wound. Staples are used to help a wound heal faster by holding the edges of the wound together. The staples can be removed when the wound has healed well enough to stay together after the staples are removed. A dressing (wound covering), depending on the location of the wound, may have been applied. This may be changed once per day or as instructed. If the dressing sticks, it may be soaked off with soapy water or hydrogen peroxide. Only take over-the-counter or prescription medicines for pain, discomfort, or fever as directed by your caregiver.  If you did not receive a tetanus shot today because you did not recall when your last one was given, check with your caregiver when you have your staples removed to determine if one is needed. Return to your caregiver's office in 1 week or as suggested to have your staples removed. SEEK IMMEDIATE MEDICAL CARE IF:   You have redness, swelling, or increasing pain in the wound.   You have pus coming from the wound.   You have a fever.   You notice a bad smell coming from the wound or dressing.   Your wound edges break open after staples have been removed.  Document Released: 03/22/2001 Document Revised: 06/16/2011 Document Reviewed: 04/06/2005  [employer] Patient Information 2012 [employer].   You have severe swelling around the wound causing pain and numbness or a change in color in your arm, hand, leg, or foot.   Your wound splits open and starts bleeding.   You have worsening numbness, weakness, or loss of function of any joint around or beyond the wound.   You develop painful lumps near the  wound or on the skin anywhere on your body.  MAKE SURE YOU:   Understand these instructions.   Will watch your condition.   Will get help right away if you are not doing well or get worse.  Document Released: 06/27/2005 Document Revised: 06/16/2011 Document Reviewed: 12/21/2010 [facility] Patient Information 2012 [place], [place].

## 2011-11-17 ENCOUNTER — Encounter: Payer: Self-pay | Admitting: Cardiothoracic Surgery

## 2011-11-17 ENCOUNTER — Ambulatory Visit (INDEPENDENT_AMBULATORY_CARE_PROVIDER_SITE_OTHER): Payer: Medicare Other | Admitting: Cardiothoracic Surgery

## 2011-11-17 VITALS — BP 169/69 | HR 61 | Resp 20 | Ht 62.0 in

## 2011-11-17 DIAGNOSIS — I2510 Atherosclerotic heart disease of native coronary artery without angina pectoris: Secondary | ICD-10-CM

## 2011-11-17 DIAGNOSIS — Z951 Presence of aortocoronary bypass graft: Secondary | ICD-10-CM

## 2011-11-17 DIAGNOSIS — T07XXXA Unspecified multiple injuries, initial encounter: Secondary | ICD-10-CM

## 2011-11-17 NOTE — Progress Notes (Signed)
[address]            [name] [address]          [phone]                        HPI: Patient returns for recheck of her right lower extremity.  She is status post CABG in January by me and later developed cellulitis of the right leg, which required readmission and operative debridement.  She was discharged home with a wound VAC.  She recently presented to our office for recheck and was noted to have recurrent cellulitis.  She was admitted for IV antibiotic therapy and was started on wet to dry dressing changes.  Cultures were positive for MRSA.  She improved and was discharged home on IV Vancomycin for 14 days.  Since hospital discharge the patient has shown continued improvement.  Her wounds have completely granulated and she has 1 remaining dose of antibiotics.  Her edema is significantly better. She is walking without difficulty.   Current Outpatient Prescriptions  Medication Sig Dispense Refill  . allopurinol (ZYLOPRIM) 100 MG tablet Take 100 mg by mouth daily.       Marland Kitchen amiodarone (PACERONE) 400 MG tablet Take 200 mg by mouth daily.      Marland Kitchen aspirin 81 MG tablet Take 81 mg by mouth daily.        . carvedilol (COREG) 6.25 MG tablet Take 6.25 mg by mouth 2 (two) times daily with a meal.      . colesevelam (WELCHOL) 625 MG tablet Take 3 tablets (1,875 mg total) by mouth 2 (two) times daily with a meal.  180 tablet  5  . ezetimibe (ZETIA) 10 MG tablet Take 1 tablet (10 mg total) by mouth daily.  30 tablet  5  . feeding supplement (PRO-STAT SUGAR FREE 64) LIQD Take 30 mLs by mouth 3 (three) times daily with meals.  900 mL  1  . furosemide (LASIX) 20 MG tablet Take 40 mg by mouth daily.       Marland Kitchen glimepiride (AMARYL) 2 MG tablet Take 2 mg by mouth daily with breakfast.      . levothyroxine (SYNTHROID, LEVOTHROID) 100 MCG tablet Take 100 mcg by mouth daily.       Marland Kitchen LORazepam (ATIVAN) 0.5 MG tablet Take 0.5 mg by mouth every 8 (eight) hours as needed. For  anxiety      . oxycodone (OXY-IR) 5 MG capsule Take 5 mg by mouth every 4 (four) hours as needed. For pain      . pantoprazole (PROTONIX) 40 MG tablet Take 1 tablet (40 mg total) by mouth daily at 12 noon.  30 tablet  5  . sodium bicarbonate 650 MG tablet Take 650 mg by mouth daily.        Marland Kitchen DISCONTD: glimepiride (AMARYL) 2 MG tablet Take 1 tablet (2 mg total) by mouth daily with breakfast.        Physical Exam: BP 157/68 HR 60 Resp 20 Wounds: Sternal wound has healed well.  Both right lower extremity incisions have completely granulated at this point and there is no erythema. Heart: RRR Lungs: clear Extremities: mild RLE edema, significantly improved from previous exams     Assessment/Plan: Patient doing well postoperatively following coronary bypass I've not made her return appointment to see me as she continues to  be followed by Dr. Herbie Baltimore, and at this point appears to have completely recovered from her coronary artery bypass grafting

## 2011-12-26 ENCOUNTER — Other Ambulatory Visit: Payer: Self-pay | Admitting: Endocrinology

## 2011-12-26 DIAGNOSIS — R609 Edema, unspecified: Secondary | ICD-10-CM

## 2011-12-27 ENCOUNTER — Ambulatory Visit
Admission: RE | Admit: 2011-12-27 | Discharge: 2011-12-27 | Disposition: A | Discharge status: Home / Self Care | Payer: Medicare Other | Source: Ambulatory Visit | Attending: Endocrinology | Admitting: Endocrinology

## 2011-12-27 DIAGNOSIS — R609 Edema, unspecified: Secondary | ICD-10-CM

## 2012-03-10 ENCOUNTER — Encounter (HOSPITAL_COMMUNITY)
Admission: AD | Disposition: A | Discharge status: Home / Self Care | Payer: Self-pay | Source: Ambulatory Visit | Attending: Cardiology

## 2012-03-10 ENCOUNTER — Emergency Department (HOSPITAL_COMMUNITY)
Admission: EM | Admit: 2012-03-10 | Discharge status: Absent without leave | Payer: Medicare Other | Source: Home / Self Care | Admitting: Cardiology

## 2012-03-10 ENCOUNTER — Encounter (HOSPITAL_COMMUNITY): Payer: Self-pay | Admitting: *Deleted

## 2012-03-10 ENCOUNTER — Encounter (HOSPITAL_COMMUNITY): Payer: Self-pay | Admitting: Cardiology

## 2012-03-10 ENCOUNTER — Inpatient Hospital Stay (HOSPITAL_COMMUNITY)
Admission: AD | Admit: 2012-03-10 | Discharge: 2012-03-15 | DRG: 247 | Disposition: A | Discharge status: Home / Self Care | Payer: Medicare Other | Source: Ambulatory Visit | Attending: Cardiology | Admitting: Cardiology

## 2012-03-10 ENCOUNTER — Emergency Department (HOSPITAL_COMMUNITY)
Admission: EM | Admit: 2012-03-10 | Discharge: 2012-03-10 | Disposition: A | Discharge status: Other Acute Inpatient Hospital | Payer: Medicare Other | Source: Home / Self Care | Attending: Emergency Medicine | Admitting: Emergency Medicine

## 2012-03-10 DIAGNOSIS — Z7902 Long term (current) use of antithrombotics/antiplatelets: Secondary | ICD-10-CM

## 2012-03-10 DIAGNOSIS — I4729 Other ventricular tachycardia: Secondary | ICD-10-CM | POA: Diagnosis not present

## 2012-03-10 DIAGNOSIS — E119 Type 2 diabetes mellitus without complications: Secondary | ICD-10-CM | POA: Diagnosis present

## 2012-03-10 DIAGNOSIS — Z8739 Personal history of other diseases of the musculoskeletal system and connective tissue: Secondary | ICD-10-CM

## 2012-03-10 DIAGNOSIS — I2510 Atherosclerotic heart disease of native coronary artery without angina pectoris: Secondary | ICD-10-CM | POA: Diagnosis present

## 2012-03-10 DIAGNOSIS — I219 Acute myocardial infarction, unspecified: Secondary | ICD-10-CM

## 2012-03-10 DIAGNOSIS — Y840 Cardiac catheterization as the cause of abnormal reaction of the patient, or of later complication, without mention of misadventure at the time of the procedure: Secondary | ICD-10-CM | POA: Diagnosis not present

## 2012-03-10 DIAGNOSIS — I129 Hypertensive chronic kidney disease with stage 1 through stage 4 chronic kidney disease, or unspecified chronic kidney disease: Secondary | ICD-10-CM | POA: Diagnosis present

## 2012-03-10 DIAGNOSIS — I25810 Atherosclerosis of coronary artery bypass graft(s) without angina pectoris: Secondary | ICD-10-CM | POA: Diagnosis present

## 2012-03-10 DIAGNOSIS — I213 ST elevation (STEMI) myocardial infarction of unspecified site: Secondary | ICD-10-CM

## 2012-03-10 DIAGNOSIS — E039 Hypothyroidism, unspecified: Secondary | ICD-10-CM | POA: Diagnosis present

## 2012-03-10 DIAGNOSIS — M109 Gout, unspecified: Secondary | ICD-10-CM | POA: Diagnosis present

## 2012-03-10 DIAGNOSIS — Z7982 Long term (current) use of aspirin: Secondary | ICD-10-CM

## 2012-03-10 DIAGNOSIS — I2119 ST elevation (STEMI) myocardial infarction involving other coronary artery of inferior wall: Principal | ICD-10-CM | POA: Diagnosis present

## 2012-03-10 DIAGNOSIS — Z79899 Other long term (current) drug therapy: Secondary | ICD-10-CM

## 2012-03-10 DIAGNOSIS — I4891 Unspecified atrial fibrillation: Secondary | ICD-10-CM | POA: Diagnosis present

## 2012-03-10 DIAGNOSIS — Z8249 Family history of ischemic heart disease and other diseases of the circulatory system: Secondary | ICD-10-CM

## 2012-03-10 DIAGNOSIS — I2589 Other forms of chronic ischemic heart disease: Secondary | ICD-10-CM | POA: Diagnosis present

## 2012-03-10 DIAGNOSIS — I472 Ventricular tachycardia, unspecified: Secondary | ICD-10-CM | POA: Diagnosis not present

## 2012-03-10 DIAGNOSIS — Z951 Presence of aortocoronary bypass graft: Secondary | ICD-10-CM

## 2012-03-10 DIAGNOSIS — N183 Chronic kidney disease, stage 3 unspecified: Secondary | ICD-10-CM | POA: Diagnosis present

## 2012-03-10 DIAGNOSIS — E785 Hyperlipidemia, unspecified: Secondary | ICD-10-CM | POA: Diagnosis present

## 2012-03-10 DIAGNOSIS — T82897A Other specified complication of cardiac prosthetic devices, implants and grafts, initial encounter: Secondary | ICD-10-CM | POA: Diagnosis not present

## 2012-03-10 DIAGNOSIS — I252 Old myocardial infarction: Secondary | ICD-10-CM

## 2012-03-10 DIAGNOSIS — Z96649 Presence of unspecified artificial hip joint: Secondary | ICD-10-CM

## 2012-03-10 DIAGNOSIS — D649 Anemia, unspecified: Secondary | ICD-10-CM | POA: Diagnosis not present

## 2012-03-10 DIAGNOSIS — Y9210 Unspecified residential institution as the place of occurrence of the external cause: Secondary | ICD-10-CM | POA: Diagnosis not present

## 2012-03-10 DIAGNOSIS — F411 Generalized anxiety disorder: Secondary | ICD-10-CM | POA: Diagnosis present

## 2012-03-10 DIAGNOSIS — I255 Ischemic cardiomyopathy: Secondary | ICD-10-CM | POA: Diagnosis present

## 2012-03-10 DIAGNOSIS — I480 Paroxysmal atrial fibrillation: Secondary | ICD-10-CM | POA: Diagnosis present

## 2012-03-10 DIAGNOSIS — I2582 Chronic total occlusion of coronary artery: Secondary | ICD-10-CM | POA: Diagnosis present

## 2012-03-10 DIAGNOSIS — E1129 Type 2 diabetes mellitus with other diabetic kidney complication: Secondary | ICD-10-CM | POA: Diagnosis present

## 2012-03-10 HISTORY — PX: LEFT HEART CATH: SHX5478

## 2012-03-10 HISTORY — PX: PERCUTANEOUS CORONARY STENT INTERVENTION (PCI-S): SHX5485

## 2012-03-10 HISTORY — PX: CORONARY ANGIOPLASTY WITH STENT PLACEMENT: SHX49

## 2012-03-10 LAB — COMPREHENSIVE METABOLIC PANEL
AST: 25 U/L (ref 0–37)
CO2: 26 mEq/L (ref 19–32)
Chloride: 107 mEq/L (ref 96–112)
Creatinine, Ser: 1.7 mg/dL — ABNORMAL HIGH (ref 0.50–1.10)
GFR calc non Af Amer: 26 mL/min — ABNORMAL LOW (ref >90)
Glucose, Bld: 174 mg/dL — ABNORMAL HIGH (ref 70–99)
Total Bilirubin: 0.3 mg/dL (ref 0.3–1.2)

## 2012-03-10 LAB — CBC
HCT: 36 % (ref 36.0–46.0)
Hemoglobin: 11.1 g/dL — ABNORMAL LOW (ref 12.0–15.0)
MCV: 72.4 fL — ABNORMAL LOW (ref 78.0–100.0)
Platelets: 254 10*3/uL (ref 150–400)
RBC: 4.97 MIL/uL (ref 3.87–5.11)
WBC: 8.5 10*3/uL (ref 4.0–10.5)

## 2012-03-10 LAB — GLUCOSE, CAPILLARY: Glucose-Capillary: 140 mg/dL — ABNORMAL HIGH (ref 70–99)

## 2012-03-10 LAB — PROTIME-INR: Prothrombin Time: 14.3 seconds (ref 11.6–15.2)

## 2012-03-10 LAB — POCT I-STAT TROPONIN I

## 2012-03-10 SURGERY — LEFT HEART CATH
Anesthesia: LOCAL

## 2012-03-10 MED ORDER — LEVOTHYROXINE SODIUM 100 MCG PO TABS
100.0000 ug | ORAL_TABLET | Freq: Every day | ORAL | Status: DC
  Administered 2012-03-12 – 2012-03-15 (×4): 100 ug via ORAL
  Filled 2012-03-10 (×6): qty 1

## 2012-03-10 MED ORDER — AMIODARONE HCL 200 MG PO TABS
200.0000 mg | ORAL_TABLET | Freq: Every day | ORAL | Status: DC
  Administered 2012-03-12 – 2012-03-15 (×4): 200 mg via ORAL
  Filled 2012-03-10 (×5): qty 1

## 2012-03-10 MED ORDER — CARVEDILOL 6.25 MG PO TABS
6.2500 mg | ORAL_TABLET | Freq: Two times a day (BID) | ORAL | Status: DC
  Administered 2012-03-11 – 2012-03-15 (×8): 6.25 mg via ORAL
  Filled 2012-03-10 (×11): qty 1

## 2012-03-10 MED ORDER — NITROGLYCERIN 0.4 MG SL SUBL
SUBLINGUAL_TABLET | SUBLINGUAL | Status: AC
  Filled 2012-03-10: qty 25

## 2012-03-10 MED ORDER — ASPIRIN 81 MG PO CHEW
243.0000 mg | CHEWABLE_TABLET | Freq: Once | ORAL | Status: DC
  Filled 2012-03-10: qty 4

## 2012-03-10 MED ORDER — LORAZEPAM 0.5 MG PO TABS
0.5000 mg | ORAL_TABLET | Freq: Three times a day (TID) | ORAL | Status: DC | PRN
  Administered 2012-03-11: 0.5 mg via ORAL
  Filled 2012-03-10: qty 1

## 2012-03-10 MED ORDER — ASPIRIN 81 MG PO CHEW
81.0000 mg | CHEWABLE_TABLET | Freq: Every day | ORAL | Status: DC
  Administered 2012-03-11 – 2012-03-15 (×5): 81 mg via ORAL
  Filled 2012-03-10 (×5): qty 1

## 2012-03-10 MED ORDER — ACETAMINOPHEN 325 MG PO TABS: 650.0000 mg | ORAL_TABLET | ORAL | Status: DC | PRN

## 2012-03-10 MED ORDER — SODIUM CHLORIDE 0.9 % IV SOLN
INTRAVENOUS | Status: DC
  Administered 2012-03-10: 1000 mL via INTRAVENOUS

## 2012-03-10 MED ORDER — GLIMEPIRIDE 2 MG PO TABS
2.0000 mg | ORAL_TABLET | Freq: Every day | ORAL | Status: DC
  Administered 2012-03-12 – 2012-03-15 (×4): 2 mg via ORAL
  Filled 2012-03-10 (×6): qty 1

## 2012-03-10 MED ORDER — OXYCODONE HCL 5 MG PO TABS: 5.0000 mg | ORAL_TABLET | ORAL | Status: DC | PRN

## 2012-03-10 MED ORDER — ONDANSETRON HCL 4 MG/2ML IJ SOLN
4.0000 mg | Freq: Four times a day (QID) | INTRAMUSCULAR | Status: DC | PRN
  Administered 2012-03-11: 4 mg via INTRAVENOUS
  Filled 2012-03-10: qty 2

## 2012-03-10 MED ORDER — ENOXAPARIN SODIUM 30 MG/0.3ML ~~LOC~~ SOLN: 30.0000 mg | SUBCUTANEOUS | Status: DC

## 2012-03-10 MED ORDER — EZETIMIBE 10 MG PO TABS
10.0000 mg | ORAL_TABLET | Freq: Every day | ORAL | Status: DC
  Administered 2012-03-12 – 2012-03-15 (×4): 10 mg via ORAL
  Filled 2012-03-10 (×5): qty 1

## 2012-03-10 MED ORDER — COLESEVELAM HCL 625 MG PO TABS
1875.0000 mg | ORAL_TABLET | Freq: Two times a day (BID) | ORAL | Status: DC
Start: 2012-03-11 — End: 2012-03-15
  Administered 2012-03-12 – 2012-03-15 (×7): 1875 mg via ORAL
  Filled 2012-03-10 (×12): qty 3

## 2012-03-10 MED ORDER — ASPIRIN 81 MG PO CHEW
324.0000 mg | CHEWABLE_TABLET | Freq: Once | ORAL | Status: AC
  Administered 2012-03-10: 324 mg via ORAL

## 2012-03-10 MED ORDER — SODIUM CHLORIDE 0.9 % IV SOLN
INTRAVENOUS | Status: DC
  Administered 2012-03-10: 999 mL/h via INTRAVENOUS

## 2012-03-10 MED ORDER — PANTOPRAZOLE SODIUM 40 MG PO TBEC
40.0000 mg | DELAYED_RELEASE_TABLET | Freq: Every day | ORAL | Status: DC
Start: 2012-03-11 — End: 2012-03-15
  Administered 2012-03-11 – 2012-03-15 (×5): 40 mg via ORAL
  Filled 2012-03-10 (×5): qty 1

## 2012-03-10 MED ORDER — CLOPIDOGREL BISULFATE 75 MG PO TABS
75.0000 mg | ORAL_TABLET | Freq: Every day | ORAL | Status: DC
  Administered 2012-03-11: 75 mg via ORAL
  Filled 2012-03-10: qty 1

## 2012-03-10 MED ORDER — ALLOPURINOL 100 MG PO TABS
100.0000 mg | ORAL_TABLET | Freq: Every day | ORAL | Status: DC
  Administered 2012-03-12 – 2012-03-15 (×4): 100 mg via ORAL
  Filled 2012-03-10 (×5): qty 1

## 2012-03-10 MED ORDER — HEPARIN SODIUM (PORCINE) 5000 UNIT/ML IJ SOLN
4000.0000 [IU] | INTRAMUSCULAR | Status: AC
  Administered 2012-03-10: 4000 [IU] via INTRAVENOUS
  Filled 2012-03-10: qty 0.8

## 2012-03-10 NOTE — CV Procedure (Signed)
Cardiac Catheterization Procedure Note  Name: [name] MRN: [id number] DOB: April 21, 1921  Procedure:  LHC, SCA, SVGA,  Aspiration thrombectomy, PTCA/Stent of SVG to the PDA  Indication: STEMI.  History and old films carefully reviewed.  Case discussed with family prior to study.     Diagnostic Procedure Details: The right groin was prepped, draped, and anesthetized with 1% lidocaine. Using the modified Seldinger technique, a 6 French sheath was introduced into the right femoral artery. A JR$ GC was used for  selective coronary angiography of the SVGs and RCA.  A standard Judkins left 4 was used for the LCA. Catheter exchanges were performed over a wire.  The diagnostic procedure was well-tolerated without immediate complications.  PCI Procedure Note:  Following the diagnostic procedure, the decision was made to proceed with PCI. The sheath was upsized to a 6 [place]. Weight-based bivalirudin was given for anticoagulation. Once a therapeutic ACT was achieved, a 6 [place] RCB guide catheter was inserted.  A prowater coronary guidewire was used to cross the lesion.  The lesion was predilated with a  2.0 mm balloon.   This demonstrated a clotted graft with multiple areas of disease and a ruptured proximal plaque.  Promus element stents were then placed in three locations.  The proximal area was covered with a 38 by 2.5 mm stent.  The distal area was stented with a 2.5 by 16 Promus element.  The area just distal to the proximal stent was then stented with a 2.57mm by 16 Promus element.  All of the stents were delivered at 14 atm.  The proximal stent was post dilated in an area of irregularity with a non compliant 2.5 mm balloon.  Importantly, we then measured LVEDP to assess the ability to deliver volume  (Poseidon), and we elected not to inject the subclavian or IMA to reduce contrast load as we visualized graft flow distally.     Following PCI, there was 0% residual stenosis and TIMI-3 flow.  The  femoral sheath was secured into place.  The patient tolerated the PCI procedure well. There were no immediate procedural complications.  The patient was transferred to the post catheterization recovery area for further monitoring.  I reviewed with findings with the family.  All efforts were made to reduce contrast load.    PROCEDURAL FINDINGS Hemodynamics: AO 153 /69 (103( LV  153/14 No gradient on pullback.     Coronary angiography: Coronary dominance: right  Left mainstem: 90% ostial stenosis  Left anterior descending (LAD): 80% proximal then 80% just after the septal and diagonal.  There is distal filling of the vessel with competition from the IMA.  The LIMA was not injected as noted to reduce contrast load. Left circumflex (LCx): There is a ramus with 50-70% segmental proximal plaque.  The AV circ proximally has 40-50% narrowing then supplies three marginal branches.  A retrograde "stump" from the occluded OM graft is seen above the vessel.   The SVG to the OM is occluded proximally.   Right coronary artery (RCA): The proximal RCA is tortuous then totally occluded.  The SVG to the PDA is occluded proximally.  Following initial reperfusion, there is severe disease of the SVG proximally, with a subtotal ruptured area, mid narrowing of 90% and distal narrowing of 80%.  Following the placement of proximal 38 and 16 mm overlapping stents, and a distal stent, there was TIMI 3 flow and no significant narrowing.  There was some residual thrombus in the proximal area despite multiple  dilatations.  There was runoff to a large PDA, and retrograde area with 70% narrowing in the distal AV portion of the RCA leading into a large PLA.    Left ventriculography: Not done.  LVEDP is normal.    PCI Data: Vessel - SVG to PDA/ 4 Percent Stenosis (pre)  100% TIMI-flow 0 Stent:  Promus Element 2.5 times 3 stents  (Cumulative length 70mm) Percent Stenosis (post) 0 TIMI-flow (post) 3   Final  Conclusions:   1.  Recent CABG with new STEMI with occlusion of the SVG to the PDA 2.  Successful PCI with multiple stents to the SVG 3.  Occlusion of the SVG to the OM 4.  80% left main ostium. 5.  Competitive filling of the distal LAD due to graft flow. 6.  IMA not injected as per report  Recommendations:  1.  DAPT 2.  Dr. Herbie Baltimore to decide final plan.    Risk of reocclusion is at least moderate.    [name] 03/10/2012, 11:34 PM

## 2012-03-10 NOTE — ED Provider Notes (Signed)
History     CSN: [id number]  Arrival date & time 03/10/12  2034   First MD Initiated Contact with Patient 03/10/12 2058      Chief Complaint  Patient presents with  . Chest Pain  . Nausea  . Emesis    (Consider location/radiation/quality/duration/timing/severity/associated sxs/prior treatment) HPI  Patient reports about 4 PM this afternoon she started having some anterior and slightly left-sided chest pain that she describes as a dullness. She denies shortness of breath but did have nausea and some vomiting. She denies radiation. She reports EMS came to her house at 5 PM however she refused to have them evaluate her or transport her. Finally her family convinced her to come to the ED and they transported her to the hospital.  Patient reports she had a tampon in November and in January she had CABG. She reports this is the first time she has had chest pain since her surgery.  PCP Dr. Juleen China Cardiologist Dr Herbie Baltimore   Past Medical History  Diagnosis Date  . Diabetes mellitus   . Hypertension   . PONV (postoperative nausea and vomiting)   . Angina   . Shortness of breath   . Chronic kidney disease   . Hypothyroidism   . Headache   . Arthritis   . Anxiety   . Myocardial infarction   . Pneumonia   . Episodic lightheadedness, with near syncope X 3 07/30/2011  . Coronary artery disease   . 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 Paroxysmal atrial fibrillation, post CABG 08/10/2011  . Anemia associated with acute blood loss, related to CABG 08/10/2011    Past Surgical History  Procedure Date  . Cardiac catheterization   . Coronary angioplasty   . Coronary artery bypass graft 08/05/2011    Procedure: CORONARY ARTERY BYPASS GRAFTING (CABG);  Surgeon: [name], MD;  Location: [facility] OR;  Service: Open Heart Surgery;  Laterality: N/A;  coronary artery bypass graft times 4 using left internal mammary artery and right leg saphenous vein  harvested endoscopically  . Joint replacement     both hips replaced  . I&d extremity 09/21/2011    Procedure: IRRIGATION AND DEBRIDEMENT EXTREMITY;  Surgeon: [name], MD;  Location: [facility] OR;  Service: Vascular;  Laterality: Right;  with wound vac placement    Family History  Problem Relation Age of Onset  . Breast cancer Mother   . Heart disease Father     History  Substance Use Topics  . Smoking status: Never Smoker   . Smokeless tobacco: Never Used  . Alcohol Use: No  Lives at home Lives with spouse  OB History    Grav Para Term Preterm Abortions TAB SAB Ect Mult Living                  Review of Systems  All other systems reviewed and are negative.    Allergies  Tape and Statins  Home Medications   Current Outpatient Rx  Name Route Sig Dispense Refill  . ALLOPURINOL 100 MG PO TABS Oral Take 100 mg by mouth daily.     . AMIODARONE HCL 400 MG PO TABS Oral Take 200 mg by mouth daily.    . ASPIRIN 81 MG PO TABS Oral Take 81 mg by mouth daily.      Marland Kitchen CARVEDILOL 6.25 MG PO TABS Oral Take 6.25 mg by mouth 2 (two) times daily with a meal.    .  COLESEVELAM HCL 625 MG PO TABS Oral Take 3 tablets (1,875 mg total) by mouth 2 (two) times daily with a meal. 180 tablet 5  . EZETIMIBE 10 MG PO TABS Oral Take 1 tablet (10 mg total) by mouth daily. 30 tablet 5  . PRO-STAT 64 PO LIQD Oral Take 30 mLs by mouth 3 (three) times daily with meals. 900 mL 1  . FUROSEMIDE 20 MG PO TABS Oral Take 40 mg by mouth daily.     Marland Kitchen GLIMEPIRIDE 2 MG PO TABS Oral Take 2 mg by mouth daily with breakfast.    . LEVOTHYROXINE SODIUM 100 MCG PO TABS Oral Take 100 mcg by mouth daily.     Marland Kitchen LORAZEPAM 0.5 MG PO TABS Oral Take 0.5 mg by mouth every 8 (eight) hours as needed. For anxiety    . OXYCODONE HCL 5 MG PO CAPS Oral Take 5 mg by mouth every 4 (four) hours as needed. For pain    . PANTOPRAZOLE SODIUM 40 MG PO TBEC Oral Take 1 tablet (40 mg total) by mouth daily at 12 noon. 30 tablet 5  .  SODIUM BICARBONATE 650 MG PO TABS Oral Take 650 mg by mouth daily.        BP 123/68  Pulse 72  Temp 97.5 F (36.4 C) (Oral)  Resp 15  Ht 5\' 1"  (1.549 m)  Wt 172 lb (78.019 kg)  BMI 32.50 kg/m2  SpO2 97%  Vital signs normal    Physical Exam  Nursing note and vitals reviewed. Constitutional: She is oriented to person, place, and time. She appears well-developed and well-nourished.  Non-toxic appearance. She does not appear ill. No distress.  HENT:  Head: Normocephalic and atraumatic.  Right Ear: External ear normal.  Left Ear: External ear normal.  Nose: Nose normal. No mucosal edema or rhinorrhea.  Mouth/Throat: Oropharynx is clear and moist and mucous membranes are normal. No dental abscesses or uvula swelling.  Eyes: Conjunctivae and EOM are normal. Pupils are equal, round, and reactive to light.  Neck: Normal range of motion and full passive range of motion without pain. Neck supple.  Cardiovascular: Normal rate, regular rhythm and normal heart sounds.  Exam reveals no gallop and no friction rub.   No murmur heard. Pulmonary/Chest: Effort normal and breath sounds normal. No respiratory distress. She has no wheezes. She has no rhonchi. She has no rales. She exhibits no tenderness and no crepitus.  Abdominal: Soft. Normal appearance and bowel sounds are normal. She exhibits no distension. There is no tenderness. There is no rebound and no guarding.  Musculoskeletal: Normal range of motion. She exhibits no edema and no tenderness.       Moves all extremities well.   Neurological: She is alert and oriented to person, place, and time. She has normal strength. No cranial nerve deficit.  Skin: Skin is warm, dry and intact. No rash noted. No erythema. No pallor.  Psychiatric: She has a normal mood and affect. Her speech is normal and behavior is normal. Her mood appears not anxious.    ED Course  Procedures (including critical care time)   Medications  aspirin chewable tablet 243  mg (not administered)  0.9 %  sodium chloride infusion (999 mL/hr Intravenous New Bag/Given 03/10/12 2115)  nitroGLYCERIN (NITROSTAT) 0.4 MG SL tablet (not administered)  aspirin chewable tablet 324 mg (324 mg Oral Given 03/10/12 2113)  heparin injection 4,000 Units (4000 Units Intravenous Given by Other 03/10/12 2116)     29:56-21:30 Dr Riley Kill  accepts in transfer to [facility] cath lab  Results for orders placed during the hospital encounter of 03/10/12  CBC      Component Value Range   WBC 8.5  4.0 - 10.5 K/uL   RBC 4.97  3.87 - 5.11 MIL/uL   Hemoglobin 11.1 (*) 12.0 - 15.0 g/dL   HCT 16.1  09.6 - 04.5 %   MCV 72.4 (*) 78.0 - 100.0 fL   MCH 22.3 (*) 26.0 - 34.0 pg   MCHC 30.8  30.0 - 36.0 g/dL   RDW 40.9 (*) 81.1 - 91.4 %   Platelets 254  150 - 400 K/uL  COMPREHENSIVE METABOLIC PANEL      Component Value Range   Sodium 144  135 - 145 mEq/L   Potassium 4.1  3.5 - 5.1 mEq/L   Chloride 107  96 - 112 mEq/L   CO2 26  19 - 32 mEq/L   Glucose, Bld 174 (*) 70 - 99 mg/dL   BUN 37 (*) 6 - 23 mg/dL   Creatinine, Ser 7.82 (*) 0.50 - 1.10 mg/dL   Calcium 9.9  8.4 - 95.6 mg/dL   Total Protein 6.8  6.0 - 8.3 g/dL   Albumin 3.8  3.5 - 5.2 g/dL   AST 25  0 - 37 U/L   ALT 15  0 - 35 U/L   Alkaline Phosphatase 104  39 - 117 U/L   Total Bilirubin 0.3  0.3 - 1.2 mg/dL   GFR calc non Af Amer 26 (*) >90 mL/min   GFR calc Af Amer 30 (*) >90 mL/min  APTT      Component Value Range   aPTT 32  24 - 37 seconds  PROTIME-INR      Component Value Range   Prothrombin Time 14.3  11.6 - 15.2 seconds   INR 1.09  0.00 - 1.49  POCT I-STAT TROPONIN I      Component Value Range   Troponin i, poc 0.02  0.00 - 0.08 ng/mL   Comment 3            Laboratory interpretation all normal except anemia, hyperglycemia, renal insuffic     Date: 03/10/2012  Rate: 63  Rhythm: normal sinus rhythm  QRS Axis: normal  Intervals: PR prolonged  ST/T Wave abnormalities: nonspecific T wave changes and ST elevations  inferiorly  Conduction Disutrbances:none  Narrative Interpretation:   Old EKG Reviewed: changes noted from 11/08/2011 the [facility] are new     1. STEMI (ST elevation myocardial infarction)     Plan transfer to [facility] Cath Lab  Devoria Albe, MD, FACEP    MDM          Ward Givens, MD 03/10/12 [phone]

## 2012-03-10 NOTE — ED Notes (Signed)
[facility] at bedside at 21:13

## 2012-03-10 NOTE — H&P (View-Only) (Signed)
[facility]       HISTORY AND PHYSICAL  Reason for admission: Acute inferior wall ST segment elevation myocardial infarction  Cardiologist: Bryan Lemma M.D.  HPI: This is a 78 y.o. female with a past medical history significant for multivessel coronary artery disease, status post acute inferior wall myocardial infarction treated with balloon angioplasty only in November of 2012, subsequently status post coronary bypass surgery in January 2013 (Dr. Kathlee Nations Trigt, LIMA to LAD, SVG to OM, SVG to PDA).  She presents today for sudden onset of anginal chest pain around 1600 hours followed by an apparent syncopal event. She remembers going to the bathroom and then was found lying on the floor unaware that she had lost consciousness. ECG shows roughly 1-2 mm ST segment elevation predominantly in the inferior leads but also these V4 through V6 with reciprocal changes in leads 1 and aVL. She is to undergo emergency cardiac catheterization and percutaneous revascularization.  Significant comorbidities include chronic kidney disease probably stage [name] (today creatinine 1.7; most recently 1.64 on April 30), history of postoperative atrial fibrillation, gout, diabetes mellitus type 2, bilateral total hip replacement. After her bypass surgery she had dehiscence and infection of the saphenectomy harvest site and a Proteus mirabilis infection successfully treated with antibiotics and wound care.  PMHx:  Past Medical History  Diagnosis Date  . Diabetes mellitus   . Hypertension   . PONV (postoperative nausea and vomiting)   . Angina   . Shortness of breath   . Chronic kidney disease   . Hypothyroidism   . Headache   . Arthritis   . Anxiety   . Myocardial infarction   . Pneumonia   . Episodic lightheadedness, with near syncope X 3 07/30/2011  . Coronary artery disease   . 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 Paroxysmal  atrial fibrillation, post CABG 08/10/2011  . Anemia associated with acute blood loss, related to CABG 08/10/2011   Past Surgical History  Procedure Date  . Cardiac catheterization   . Coronary angioplasty   . Coronary artery bypass graft 08/05/2011    Procedure: CORONARY ARTERY BYPASS GRAFTING (CABG);  Surgeon: [name], MD;  Location: [facility] OR;  Service: Open Heart Surgery;  Laterality: N/A;  coronary artery bypass graft times 4 using left internal mammary artery and right leg saphenous vein harvested endoscopically  . Joint replacement     both hips replaced  . I&d extremity 09/21/2011    Procedure: IRRIGATION AND DEBRIDEMENT EXTREMITY;  Surgeon: [name], MD;  Location: [facility] OR;  Service: Vascular;  Laterality: Right;  with wound vac placement    FAMHx: Family History  Problem Relation Age of Onset  . Breast cancer Mother   . Heart disease Father     SOCHx:  reports that she has never smoked. She has never used smokeless tobacco. She reports that she does not drink alcohol or use illicit drugs.  ALLERGIES: Allergies  Allergen Reactions  . Tape     Use paper tape.  . Statins Rash    ROS: Pertinent items are noted in HPI. cannot be fully obtained due to the need for emergency cardiac catheterization  HOME MEDICATIONS: PER LAST DC SUMMARY (October 19, 2011)  ** NOT CONFIRMED **  allopurinol 100 MG tablet   Take 100 mg by mouth daily.  Commonly known as: ZYLOPRIM    Take 100  mg by mouth daily.     amiodarone 400 MG tablet    Commonly known as: PACERONE    Take 200 mg by mouth daily.     aspirin 81 MG tablet    Take 81 mg by mouth daily.     carvedilol 6.25 MG tablet    Commonly known as: COREG    Take 6.25 mg by mouth 2 (two) times daily with a meal.     colesevelam 625 MG tablet    Commonly known as: WELCHOL    Take 3 tablets (1,875 mg total) by mouth 2 (two) times daily with a meal.     ezetimibe 10 MG tablet    Commonly known as: ZETIA    Take 1  tablet (10 mg total) by mouth daily.     feeding supplement Liqd    Take 30 mLs by mouth 3 (three) times daily with meals.     folic acid 1 MG tablet    Commonly known as: FOLVITE    Take 1 tablet (1 mg total) by mouth daily.     furosemide 20 MG tablet    Commonly known as: LASIX    Take 80 mg by mouth 2 (two) times daily.     glimepiride 2 MG tablet    Commonly known as: AMARYL    Take 2 mg by mouth daily with breakfast.     levothyroxine 100 MCG tablet    Commonly known as: SYNTHROID, LEVOTHROID    Take 100 mcg by mouth daily.     LORazepam 0.5 MG tablet    Commonly known as: ATIVAN    Take 0.5 mg by mouth every 8 (eight) hours as needed. For anxiety     oxyCODONE 5 MG immediate release tablet    Commonly known as: Oxy IR/ROXICODONE    Take 1 tablet (5 mg total) by mouth every 4 (four) hours as needed.     pantoprazole 40 MG tablet    Commonly known as: PROTONIX    Take 1 tablet (40 mg total) by mouth daily at 12 noon.     sodium bicarbonate 650 MG tablet    Take 650 mg by mouth daily.      HOSPITAL MEDICATIONS: Prior to Admission:  (Not in a hospital admission)  VITALS: HR 70, RR 20, BP 140/72 There were no vitals taken for this visit.  PHYSICAL EXAM: performed by Dr. Riley Kill,  deferred tdue to emergency nature of procedure  LABS: Results for orders placed during the hospital encounter of 03/10/12 (from the past 48 hour(s))  CBC     Status: Abnormal   Collection Time   03/10/12  9:07 PM      Component Value Range Comment   WBC 8.5  4.0 - 10.5 K/uL    RBC 4.97  3.87 - 5.11 MIL/uL    Hemoglobin 11.1 (*) 12.0 - 15.0 g/dL    HCT 82.9  56.2 - 13.0 %    MCV 72.4 (*) 78.0 - 100.0 fL    MCH 22.3 (*) 26.0 - 34.0 pg    MCHC 30.8  30.0 - 36.0 g/dL    RDW 86.5 (*) 78.4 - 15.5 %    Platelets 254  150 - 400 K/uL   COMPREHENSIVE METABOLIC PANEL     Status: Abnormal   Collection Time   03/10/12  9:07 PM      Component Value Range Comment   Sodium 144  135 - 145  mEq/L  Potassium 4.1  3.5 - 5.1 mEq/L    Chloride 107  96 - 112 mEq/L    CO2 26  19 - 32 mEq/L    Glucose, Bld 174 (*) 70 - 99 mg/dL    BUN 37 (*) 6 - 23 mg/dL    Creatinine, Ser 3.08 (*) 0.50 - 1.10 mg/dL    Calcium 9.9  8.4 - 65.7 mg/dL    Total Protein 6.8  6.0 - 8.3 g/dL    Albumin 3.8  3.5 - 5.2 g/dL    AST 25  0 - 37 U/L    ALT 15  0 - 35 U/L    Alkaline Phosphatase 104  39 - 117 U/L    Total Bilirubin 0.3  0.3 - 1.2 mg/dL    GFR calc non Af Amer 26 (*) >90 mL/min    GFR calc Af Amer 30 (*) >90 mL/min   APTT     Status: Normal   Collection Time   03/10/12  9:07 PM      Component Value Range Comment   aPTT 32  24 - 37 seconds   PROTIME-INR     Status: Normal   Collection Time   03/10/12  9:07 PM      Component Value Range Comment   Prothrombin Time 14.3  11.6 - 15.2 seconds    INR 1.09  0.00 - 1.49   POCT I-STAT TROPONIN I     Status: Normal   Collection Time   03/10/12  9:11 PM      Component Value Range Comment   Troponin i, poc 0.02  0.00 - 0.08 ng/mL    Comment 3              IMAGING: No results found.  DIAGNOSES: Active Problems:  * No active hospital problems. *    IMPRESSION: 1. Acute inferior wallST segments elevation myocardial infarction with probable lateral extension, without evidence of acute arrhythmia or acute heart failure  RECOMMENDATION: 1. Emergency percutaneous revascularization if possible    Thurmon Fair, MD, [facility] [phone] office [phone] pager  Jarmon Javid 03/10/2012, 10:18 PM

## 2012-03-10 NOTE — H&P (Signed)
   [facility]       HISTORY AND PHYSICAL  Reason for admission: Acute inferior wall ST segment elevation myocardial infarction  Cardiologist: David Harding M.D.  HPI: This is a 78 y.o. female with a past medical history significant for multivessel coronary artery disease, status post acute inferior wall myocardial infarction treated with balloon angioplasty only in November of 2012, subsequently status post coronary bypass surgery in January 2013 (Dr. Peter van Trigt, LIMA to LAD, SVG to OM, SVG to PDA).  She presents today for sudden onset of anginal chest pain around 1600 hours followed by an apparent syncopal event. She remembers going to the bathroom and then was found lying on the floor unaware that she had lost consciousness. ECG shows roughly 1-2 mm ST segment elevation predominantly in the inferior leads but also these V4 through V6 with reciprocal changes in leads 1 and aVL. She is to undergo emergency cardiac catheterization and percutaneous revascularization.  Significant comorbidities include chronic kidney disease probably stage [name] (today creatinine 1.7; most recently 1.64 on April 30), history of postoperative atrial fibrillation, gout, diabetes mellitus type 2, bilateral total hip replacement. After her bypass surgery she had dehiscence and infection of the saphenectomy harvest site and a Proteus mirabilis infection successfully treated with antibiotics and wound care.  PMHx:  Past Medical History  Diagnosis Date  . Diabetes mellitus   . Hypertension   . PONV (postoperative nausea and vomiting)   . Angina   . Shortness of breath   . Chronic kidney disease   . Hypothyroidism   . Headache   . Arthritis   . Anxiety   . Myocardial infarction   . Pneumonia   . Episodic lightheadedness, with near syncope X 3 07/30/2011  . Coronary artery disease   . 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 Paroxysmal  atrial fibrillation, post CABG 08/10/2011  . Anemia associated with acute blood loss, related to CABG 08/10/2011   Past Surgical History  Procedure Date  . Cardiac catheterization   . Coronary angioplasty   . Coronary artery bypass graft 08/05/2011    Procedure: CORONARY ARTERY BYPASS GRAFTING (CABG);  Surgeon: Edward B Gerhardt, MD;  Location: [facility] OR;  Service: Open Heart Surgery;  Laterality: N/A;  coronary artery bypass graft times 4 using left internal mammary artery and right leg saphenous vein harvested endoscopically  . Joint replacement     both hips replaced  . I&d extremity 09/21/2011    Procedure: IRRIGATION AND DEBRIDEMENT EXTREMITY;  Surgeon: Edward B Gerhardt, MD;  Location: [facility] OR;  Service: Vascular;  Laterality: Right;  with wound vac placement    FAMHx: Family History  Problem Relation Age of Onset  . Breast cancer Mother   . Heart disease Father     SOCHx:  reports that she has never smoked. She has never used smokeless tobacco. She reports that she does not drink alcohol or use illicit drugs.  ALLERGIES: Allergies  Allergen Reactions  . Tape     Use paper tape.  . Statins Rash    ROS: Pertinent items are noted in HPI. cannot be fully obtained due to the need for emergency cardiac catheterization  HOME MEDICATIONS: PER LAST DC SUMMARY (October 19, 2011)  ** NOT CONFIRMED **  allopurinol 100 MG tablet   Take 100 mg by mouth daily.  Commonly known as: ZYLOPRIM    Take 100   mg by mouth daily.     amiodarone 400 MG tablet    Commonly known as: PACERONE    Take 200 mg by mouth daily.     aspirin 81 MG tablet    Take 81 mg by mouth daily.     carvedilol 6.25 MG tablet    Commonly known as: COREG    Take 6.25 mg by mouth 2 (two) times daily with a meal.     colesevelam 625 MG tablet    Commonly known as: WELCHOL    Take 3 tablets (1,875 mg total) by mouth 2 (two) times daily with a meal.     ezetimibe 10 MG tablet    Commonly known as: ZETIA    Take 1  tablet (10 mg total) by mouth daily.     feeding supplement Liqd    Take 30 mLs by mouth 3 (three) times daily with meals.     folic acid 1 MG tablet    Commonly known as: FOLVITE    Take 1 tablet (1 mg total) by mouth daily.     furosemide 20 MG tablet    Commonly known as: LASIX    Take 80 mg by mouth 2 (two) times daily.     glimepiride 2 MG tablet    Commonly known as: AMARYL    Take 2 mg by mouth daily with breakfast.     levothyroxine 100 MCG tablet    Commonly known as: SYNTHROID, LEVOTHROID    Take 100 mcg by mouth daily.     LORazepam 0.5 MG tablet    Commonly known as: ATIVAN    Take 0.5 mg by mouth every 8 (eight) hours as needed. For anxiety     oxyCODONE 5 MG immediate release tablet    Commonly known as: Oxy IR/ROXICODONE    Take 1 tablet (5 mg total) by mouth every 4 (four) hours as needed.     pantoprazole 40 MG tablet    Commonly known as: PROTONIX    Take 1 tablet (40 mg total) by mouth daily at 12 noon.     sodium bicarbonate 650 MG tablet    Take 650 mg by mouth daily.      HOSPITAL MEDICATIONS: Prior to Admission:  (Not in a hospital admission)  VITALS: HR 70, RR 20, BP 140/72 There were no vitals taken for this visit.  PHYSICAL EXAM: performed by Dr. Stuckey,  deferred tdue to emergency nature of procedure  LABS: Results for orders placed during the hospital encounter of 03/10/12 (from the past 48 hour(s))  CBC     Status: Abnormal   Collection Time   03/10/12  9:07 PM      Component Value Range Comment   WBC 8.5  4.0 - 10.5 K/uL    RBC 4.97  3.87 - 5.11 MIL/uL    Hemoglobin 11.1 (*) 12.0 - 15.0 g/dL    HCT 36.0  36.0 - 46.0 %    MCV 72.4 (*) 78.0 - 100.0 fL    MCH 22.3 (*) 26.0 - 34.0 pg    MCHC 30.8  30.0 - 36.0 g/dL    RDW 22.4 (*) 11.5 - 15.5 %    Platelets 254  150 - 400 K/uL   COMPREHENSIVE METABOLIC PANEL     Status: Abnormal   Collection Time   03/10/12  9:07 PM      Component Value Range Comment   Sodium 144  135 - 145  mEq/L      Potassium 4.1  3.5 - 5.1 mEq/L    Chloride 107  96 - 112 mEq/L    CO2 26  19 - 32 mEq/L    Glucose, Bld 174 (*) 70 - 99 mg/dL    BUN 37 (*) 6 - 23 mg/dL    Creatinine, Ser 1.70 (*) 0.50 - 1.10 mg/dL    Calcium 9.9  8.4 - 10.5 mg/dL    Total Protein 6.8  6.0 - 8.3 g/dL    Albumin 3.8  3.5 - 5.2 g/dL    AST 25  0 - 37 U/L    ALT 15  0 - 35 U/L    Alkaline Phosphatase 104  39 - 117 U/L    Total Bilirubin 0.3  0.3 - 1.2 mg/dL    GFR calc non Af Amer 26 (*) >90 mL/min    GFR calc Af Amer 30 (*) >90 mL/min   APTT     Status: Normal   Collection Time   03/10/12  9:07 PM      Component Value Range Comment   aPTT 32  24 - 37 seconds   PROTIME-INR     Status: Normal   Collection Time   03/10/12  9:07 PM      Component Value Range Comment   Prothrombin Time 14.3  11.6 - 15.2 seconds    INR 1.09  0.00 - 1.49   POCT I-STAT TROPONIN I     Status: Normal   Collection Time   03/10/12  9:11 PM      Component Value Range Comment   Troponin i, poc 0.02  0.00 - 0.08 ng/mL    Comment 3              IMAGING: No results found.  DIAGNOSES: Active Problems:  * No active hospital problems. *    IMPRESSION: 1. Acute inferior wallST segments elevation myocardial infarction with probable lateral extension, without evidence of acute arrhythmia or acute heart failure  RECOMMENDATION: 1. Emergency percutaneous revascularization if possible    [name] Waldvogel, MD, FACC [facility] [phone] office [phone] pager  [name] 03/10/2012, 10:18 PM       

## 2012-03-10 NOTE — Interval H&P Note (Signed)
History and Physical Interval Note:  03/10/2012 11:24 PM  [name]  has presented today for surgery, with the diagnosis of stemi.  The various methods of treatment have been discussed with the patient and family. After consideration of risks, benefits and other options for treatment, the patient has consented to  Procedure(s) (LRB): LEFT HEART CATH (N/A) PERCUTANEOUS CORONARY STENT INTERVENTION (PCI-S) () as a surgical intervention .  The patient's history has been reviewed, patient examined, no change in status, stable for surgery.  I have reviewed the patient's chart and labs.  Questions were answered to the patient's satisfaction.     [name]

## 2012-03-10 NOTE — ED Notes (Signed)
Pt c/o L chest pain, nausea and vomiting x 1 starting at 1600 today. Pt denies radiation of pain. Pt presents as pale. Pt was found in floor of bathroom on floor. Pt states she remembers getting up from toilet to get her walker and the next thing she knew she was on the floor wondering how she got there. Pt has abrasion on L elbow but no other injuries or pain she attributes to the fall.

## 2012-03-11 ENCOUNTER — Encounter (HOSPITAL_COMMUNITY)
Admission: AD | Disposition: A | Discharge status: Home / Self Care | Payer: Self-pay | Source: Ambulatory Visit | Attending: Cardiology

## 2012-03-11 ENCOUNTER — Other Ambulatory Visit: Payer: Self-pay

## 2012-03-11 HISTORY — PX: CORONARY ANGIOPLASTY: SHX604

## 2012-03-11 HISTORY — PX: TRANSTHORACIC ECHOCARDIOGRAM: SHX275

## 2012-03-11 HISTORY — PX: LEFT HEART CATHETERIZATION WITH CORONARY ANGIOGRAM: SHX5451

## 2012-03-11 LAB — CBC
HCT: 31.2 % — ABNORMAL LOW (ref 36.0–46.0)
HCT: 32.2 % — ABNORMAL LOW (ref 36.0–46.0)
Hemoglobin: 9.7 g/dL — ABNORMAL LOW (ref 12.0–15.0)
Hemoglobin: 9.7 g/dL — ABNORMAL LOW (ref 12.0–15.0)
MCH: 22.4 pg — ABNORMAL LOW (ref 26.0–34.0)
MCV: 72.1 fL — ABNORMAL LOW (ref 78.0–100.0)
Platelets: 186 10*3/uL (ref 150–400)
RBC: 4.1 MIL/uL (ref 3.87–5.11)
RBC: 4.33 MIL/uL (ref 3.87–5.11)
RDW: 21.8 % — ABNORMAL HIGH (ref 11.5–15.5)
WBC: 5.3 10*3/uL (ref 4.0–10.5)
WBC: 6.6 10*3/uL (ref 4.0–10.5)

## 2012-03-11 LAB — GLUCOSE, CAPILLARY
Glucose-Capillary: 133 mg/dL — ABNORMAL HIGH (ref 70–99)
Glucose-Capillary: 140 mg/dL — ABNORMAL HIGH (ref 70–99)

## 2012-03-11 LAB — CK TOTAL AND CKMB (NOT AT ARMC)
CK, MB: 66.3 ng/mL (ref 0.3–4.0)
Relative Index: 15.4 — ABNORMAL HIGH (ref 0.0–2.5)
Relative Index: 14.9 — ABNORMAL HIGH (ref 0.0–2.5)

## 2012-03-11 LAB — BASIC METABOLIC PANEL
BUN: 32 mg/dL — ABNORMAL HIGH (ref 6–23)
Chloride: 111 mEq/L (ref 96–112)
GFR calc Af Amer: 33 mL/min — ABNORMAL LOW (ref >90)
Glucose, Bld: 110 mg/dL — ABNORMAL HIGH (ref 70–99)
Potassium: 3.7 mEq/L (ref 3.5–5.1)

## 2012-03-11 LAB — MRSA PCR SCREENING: MRSA by PCR: POSITIVE — AB

## 2012-03-11 SURGERY — LEFT HEART CATHETERIZATION WITH CORONARY ANGIOGRAM
Anesthesia: LOCAL | Laterality: Bilateral

## 2012-03-11 MED ORDER — SODIUM CHLORIDE 0.9 % IV SOLN
INTRAVENOUS | Status: AC
  Administered 2012-03-11: 20:00:00 via INTRAVENOUS
  Administered 2012-03-11: 1000 mL via INTRAVENOUS

## 2012-03-11 MED ORDER — NITROGLYCERIN IN D5W 200-5 MCG/ML-% IV SOLN
INTRAVENOUS | Status: AC
  Administered 2012-03-11: 10 ug
  Filled 2012-03-11: qty 250

## 2012-03-11 MED ORDER — SODIUM CHLORIDE 0.9 % IV SOLN
1.0000 mg/kg/h | INTRAVENOUS | Status: DC
  Filled 2012-03-11 (×2): qty 250

## 2012-03-11 MED ORDER — TICAGRELOR 90 MG PO TABS
90.0000 mg | ORAL_TABLET | Freq: Two times a day (BID) | ORAL | Status: DC
  Administered 2012-03-12 – 2012-03-15 (×7): 90 mg via ORAL
  Filled 2012-03-11 (×8): qty 1

## 2012-03-11 MED ORDER — TICAGRELOR 90 MG PO TABS
ORAL_TABLET | ORAL | Status: AC
  Filled 2012-03-11: qty 2

## 2012-03-11 MED ORDER — ATROPINE SULFATE 1 MG/ML IJ SOLN
INTRAMUSCULAR | Status: AC
  Filled 2012-03-11: qty 1

## 2012-03-11 MED ORDER — HEPARIN (PORCINE) IN NACL 2-0.9 UNIT/ML-% IJ SOLN
INTRAMUSCULAR | Status: AC
  Filled 2012-03-11: qty 1000

## 2012-03-11 MED ORDER — CHLORHEXIDINE GLUCONATE CLOTH 2 % EX PADS
6.0000 | MEDICATED_PAD | Freq: Every day | CUTANEOUS | Status: AC
  Administered 2012-03-11 – 2012-03-15 (×5): 6 via TOPICAL

## 2012-03-11 MED ORDER — FENTANYL CITRATE 0.05 MG/ML IJ SOLN
INTRAMUSCULAR | Status: AC
  Filled 2012-03-11: qty 2

## 2012-03-11 MED ORDER — MIDAZOLAM HCL 2 MG/2ML IJ SOLN
INTRAMUSCULAR | Status: AC
  Filled 2012-03-11: qty 2

## 2012-03-11 MED ORDER — MUPIROCIN 2 % EX OINT
1.0000 "application " | TOPICAL_OINTMENT | Freq: Two times a day (BID) | CUTANEOUS | Status: AC
  Administered 2012-03-11 – 2012-03-15 (×10): 1 via NASAL
  Filled 2012-03-11 (×2): qty 22

## 2012-03-11 MED ORDER — LISINOPRIL 5 MG PO TABS
5.0000 mg | ORAL_TABLET | Freq: Every day | ORAL | Status: DC
  Filled 2012-03-11: qty 1

## 2012-03-11 MED ORDER — LIDOCAINE HCL (PF) 1 % IJ SOLN
INTRAMUSCULAR | Status: AC
  Filled 2012-03-11: qty 30

## 2012-03-11 MED ORDER — NITROGLYCERIN IN D5W 200-5 MCG/ML-% IV SOLN
10.0000 ug/min | INTRAVENOUS | Status: DC
  Administered 2012-03-11: 10 ug/min via INTRAVENOUS

## 2012-03-11 MED ORDER — PROMETHAZINE HCL 25 MG PO TABS: 12.5000 mg | ORAL_TABLET | Freq: Four times a day (QID) | ORAL | Status: DC | PRN

## 2012-03-11 MED ORDER — NITROGLYCERIN 0.2 MG/ML ON CALL CATH LAB
INTRAVENOUS | Status: AC
  Filled 2012-03-11: qty 1

## 2012-03-11 NOTE — Progress Notes (Signed)
Recurrent chest pain 2/10, associated with nausea and diaphoresis. ECG shows recurrent ST segment elevation in the inferior leads, which had resolve completely after her PCI.  Will take back to coronary angiography urgently.  Thurmon Fair, MD, [facility] [phone] office [phone] pager 03/11/2012

## 2012-03-11 NOTE — Progress Notes (Signed)
[facility]  DAILY PROGRESS NOTE   Subjective:  Free of angina or dyspnea at rest. No clinical signs of CHF, no arrhythmia. Groin OK post sheath pull at 6 AM  Objective:  Temp:  [97.2 F (36.2 C)-97.8 F (36.6 C)] 97.2 F (36.2 C) (09/01 0732) Pulse Rate:  [64-75] 70  (09/01 0700) Resp:  [13-21] 14  (09/01 0700) BP: (117-141)/(40-74) 132/53 mmHg (09/01 0700) SpO2:  [96 %-100 %] 100 % (09/01 0700) Weight:  [78 kg (171 lb 15.3 oz)-78.019 kg (172 lb)] 78 kg (171 lb 15.3 oz) (09/01 0000) Weight change:   Intake/Output from previous day: 08/31 0701 - 09/01 0700 In: 904 [I.V.:904] Out: 480 [Urine:480]  Intake/Output from this shift: Total I/O In: 3.2 [I.V.:3.2] Out: -   Medications: Current Facility-Administered Medications  Medication Dose Route Frequency Provider Last Rate Last Dose  . 0.9 %  sodium chloride infusion   Intravenous Continuous Herby Abraham, MD 125 mL/hr at 03/10/12 2358 1,000 mL at 03/10/12 2358  . acetaminophen (TYLENOL) tablet 650 mg  650 mg Oral Q4H PRN Herby Abraham, MD      . allopurinol (ZYLOPRIM) tablet 100 mg  100 mg Oral Daily Herby Abraham, MD      . amiodarone (PACERONE) tablet 200 mg  200 mg Oral Daily Herby Abraham, MD      . aspirin chewable tablet 81 mg  81 mg Oral Daily Herby Abraham, MD      . carvedilol (COREG) tablet 6.25 mg  6.25 mg Oral BID WC Herby Abraham, MD      . Chlorhexidine Gluconate Cloth 2 % PADS 6 each  6 each Topical Q0600 Herby Abraham, MD      . clopidogrel (PLAVIX) tablet 75 mg  75 mg Oral Q breakfast Herby Abraham, MD      . colesevelam [facility]) tablet 1,875 mg  1,875 mg Oral BID WC Herby Abraham, MD      . enoxaparin (LOVENOX) injection 30 mg  30 mg Subcutaneous Q24H Herby Abraham, MD      . ezetimibe (ZETIA) tablet 10 mg  10 mg Oral Daily Herby Abraham, MD      . glimepiride (AMARYL) tablet 2 mg  2 mg Oral Q breakfast Herby Abraham, MD      .  levothyroxine (SYNTHROID, LEVOTHROID) tablet 100 mcg  100 mcg Oral Daily Herby Abraham, MD      . LORazepam (ATIVAN) tablet 0.5 mg  0.5 mg Oral Q8H PRN Herby Abraham, MD   0.5 mg at 03/11/12 0509  . mupirocin ointment (BACTROBAN) 2 % 1 application  1 application Nasal BID Herby Abraham, MD      . nitroGLYCERIN 0.2 mg/mL in dextrose 5 % infusion        10 mcg at 03/11/12 0742  . nitroGLYCERIN 0.2 mg/mL in dextrose 5 % infusion  10 mcg/min Intravenous Titrated Herby Abraham, MD 4.5 mL/hr at 03/11/12 0600 15 mcg/min at 03/11/12 0600  . ondansetron (ZOFRAN) injection 4 mg  4 mg Intravenous Q6H PRN Herby Abraham, MD      . oxyCODONE (Oxy IR/ROXICODONE) immediate release tablet 5 mg  5 mg Oral Q4H PRN Herby Abraham, MD      . pantoprazole (PROTONIX) EC tablet 40 mg  40 mg Oral Q1200 Herby Abraham, MD      . DISCONTD: atropine 1 MG/ML injection  Facility-Administered Medications Ordered in Other Encounters  Medication Dose Route Frequency Provider Last Rate Last Dose  . aspirin chewable tablet 324 mg  324 mg Oral Once Ward Givens, MD   324 mg at 03/10/12 2113  . heparin injection 4,000 Units  4,000 Units Intravenous STAT Ward Givens, MD   4,000 Units at 03/10/12 2116  . DISCONTD: 0.9 %  sodium chloride infusion   Intravenous Continuous Ward Givens, MD 999 mL/hr at 03/10/12 2115 999 mL/hr at 03/10/12 2115  . DISCONTD: aspirin chewable tablet 243 mg  243 mg Oral Once Ward Givens, MD      . DISCONTD: nitroGLYCERIN (NITROSTAT) 0.4 MG SL tablet             Physical Exam: General appearance: alert, cooperative and no distress Neck: no adenopathy, no carotid bruit, no JVD, supple, symmetrical, trachea midline and thyroid not enlarged, symmetric, no tenderness/mass/nodules Lungs: clear to auscultation bilaterally Heart: regular rate and rhythm, S1, S2 normal, no murmur, click, rub or gallop Abdomen: soft, non-tender; bowel sounds normal; no masses,  no  organomegaly Extremities: extremities normal, atraumatic, no cyanosis or edema and no hematoma/bruit or echymosis right groin Pulses: 2+ and symmetric Skin: Skin color, texture, turgor normal. No rashes or lesions Neurologic: Alert and oriented X 3, normal strength and tone. Normal symmetric reflexes. Normal coordination and gait  Lab Results: Results for orders placed during the hospital encounter of 03/10/12 (from the past 48 hour(s))  GLUCOSE, CAPILLARY     Status: Abnormal   Collection Time   03/10/12 11:36 PM      Component Value Range Comment   Glucose-Capillary 140 (*) 70 - 99 mg/dL   MRSA PCR SCREENING     Status: Abnormal   Collection Time   03/10/12 11:47 PM      Component Value Range Comment   MRSA by PCR POSITIVE (*) NEGATIVE   CBC     Status: Abnormal   Collection Time   03/10/12 11:59 PM      Component Value Range Comment   WBC 7.8  4.0 - 10.5 K/uL    RBC 4.33  3.87 - 5.11 MIL/uL    Hemoglobin 9.7 (*) 12.0 - 15.0 g/dL    HCT 96.0 (*) 45.4 - 46.0 %    MCV 72.1 (*) 78.0 - 100.0 fL    MCH 22.4 (*) 26.0 - 34.0 pg    MCHC 31.1  30.0 - 36.0 g/dL    RDW 09.8 (*) 11.9 - 15.5 %    Platelets 210  150 - 400 K/uL   CREATININE, SERUM     Status: Abnormal   Collection Time   03/10/12 11:59 PM      Component Value Range Comment   Creatinine, Ser 1.58 (*) 0.50 - 1.10 mg/dL    GFR calc non Af Amer 28 (*) >90 mL/min    GFR calc Af Amer 33 (*) >90 mL/min   POCT ACTIVATED CLOTTING TIME     Status: Normal   Collection Time   03/11/12  3:56 AM      Component Value Range Comment   Activated Clotting Time 180     POCT ACTIVATED CLOTTING TIME     Status: Normal   Collection Time   03/11/12  4:58 AM      Component Value Range Comment   Activated Clotting Time 170     CBC     Status: Abnormal   Collection Time   03/11/12  6:01 AM  Component Value Range Comment   WBC 5.3  4.0 - 10.5 K/uL    RBC 4.40  3.87 - 5.11 MIL/uL    Hemoglobin 9.7 (*) 12.0 - 15.0 g/dL    HCT 19.1 (*) 47.8 -  46.0 %    MCV 73.2 (*) 78.0 - 100.0 fL    MCH 22.0 (*) 26.0 - 34.0 pg    MCHC 30.1  30.0 - 36.0 g/dL    RDW 29.5 (*) 62.1 - 15.5 %    Platelets 211  150 - 400 K/uL   BASIC METABOLIC PANEL     Status: Abnormal   Collection Time   03/11/12  6:01 AM      Component Value Range Comment   Sodium 144  135 - 145 mEq/L    Potassium 3.7  3.5 - 5.1 mEq/L    Chloride 111  96 - 112 mEq/L    CO2 24  19 - 32 mEq/L    Glucose, Bld 110 (*) 70 - 99 mg/dL    BUN 32 (*) 6 - 23 mg/dL    Creatinine, Ser 3.08 (*) 0.50 - 1.10 mg/dL    Calcium 9.2  8.4 - 65.7 mg/dL    GFR calc non Af Amer 28 (*) >90 mL/min    GFR calc Af Amer 33 (*) >90 mL/min   CK TOTAL AND CKMB     Status: Abnormal   Collection Time   03/11/12  6:01 AM      Component Value Range Comment   Total CK 519 (*) 7 - 177 U/L    CK, MB 79.7 (*) 0.3 - 4.0 ng/mL CRITICAL VALUE NOTED.  VALUE IS CONSISTENT WITH PREVIOUSLY REPORTED AND CALLED VALUE.   Relative Index 15.4 (*) 0.0 - 2.5   GLUCOSE, CAPILLARY     Status: Abnormal   Collection Time   03/11/12  7:33 AM      Component Value Range Comment   Glucose-Capillary 115 (*) 70 - 99 mg/dL     Imaging: No results found.  Assessment:  1. Acute inferolateral STEMI due to thrombotic occlusion of SVG-RCA, s/p bare metal stent x 3 to SVG 2. Occluded SVG to OM, patent LIMA to LAD 3. CKD stage [name] 4. DM type II  Plan:  1. Keep in ICU today 2. Recheck echo 3. Monitor renal function (at baseline today) 4. Low dose ACE inhibitor for post MI and suspected LV dysfunction  Time Spent Directly with Patient:  40 minutes  Length of Stay:  LOS: 1 day    Margi Edmundson 03/11/2012, 9:41 AM

## 2012-03-11 NOTE — CV Procedure (Signed)
   CARDIAC CATH NOTE  Name: [name] MRN: [id number] DOB: [birth year]  Procedure: PTCA and aspiration thrombectomy of the SVG to the RCA  Indication: stent thrombosis  Procedural Details: The procedure was performed through a previously placed sheath from [name].  Weight-based bivalirudin was given for anticoagulation. Once a therapeutic ACT was achieved, a 6 [place] RCB guide catheter was inserted.  A BMW coronary guidewire was used to cross the lesion.   We could not cross with a 2.7mm balloon, and a 1.25 mm Sprinter was used for initial access.   The balloon was then upgraded first to a 2.0 balloon, then to a 2.5 mm balloon.  Multiple inflations were then performed.  Aspiration thrombectomy was performed up and down the graft multiple times.  Timi 3 flow was reestablished but there remained evidence of clot throughout the graft.     Following PCI, there was no residual stenosis and TIMI-3 flow, but evidence of diffuse clot. . The patient was transferred to the post catheterization recovery area for further monitoring.  We meticulously attempted to limit contrast due to renal concerns.  We did elect to give Ticagrelor given the limited options to prevent re-thrombosis.    AO  115/52 (78)  Lesion Data: Vessel: SVG to PDA Percent stenosis (pre): 0 TIMI-flow (pre):  3 Stent:  none Percent stenosis (post): <20 TIMI-flow (post): 3  The angio demonstrated an occluded SVG at the ostium.  There was evidence of diffuse clotting up and down the graft, between stents as well.  The patient had three DES of 70mm placed yesterday for a diffusely clotted stent, and the findings were potentially anticipated.  With her prior pre op MI, and two subsequent events, and limited options, would not favor re-evaluation in the setting of recurrent closure.    Conclusions:  1.  Successful reopening of clotted SVG following PCI of SVG yesterday.  Recommendations:  1.  DC sheath when ACT is less than 150 2.   Continue brilinta for now.  3.  Hydration for renal protection.    Discussed with large family in detail.    [name] 03/11/2012, 7:35 PM

## 2012-03-11 NOTE — CV Procedure (Signed)
CARDIAC CATHETERIZATION REPORT   Procedures performed:  1.Selective angiography of the saphenous vein graft bypass to the right coronary artery   Reason for procedure:  Acute inferior wall ST segment elevation myocardial infarction    Procedure performed by: Thurmon Fair, MD, [facility]  Complications: none   Estimated blood loss: less than 5 mL   History:  This 78 year old woman presented yesterday evening with acute control of the segment elevation myocardial infarction presenting as syncope. She underwent extensive revascularization of the saphenous vein graft bypass to the right coronary artery which had a very large thrombotic burden. 3 bare-metal stents were placed. Again of the procedure showed complete resolution of her angina and normalization of ST segment deviation.  Earlier today she had recurrence of anginal chest discomfort nausea mild diaphoresis and ST segment elevation. She is brought back to the cardiac cath lab suite for repeat angiography due to suspicion of acute stent thrombosis.  Consent: The risks, benefits, and details of the procedure were explained to the patient. Risks including death, MI, stroke, bleeding, limb ischemia, renal failure and allergy were described and accepted by the patient. Informed written consent was obtained prior to proceeding.  Technique: The patient was brought to the cardiac catheterization laboratory in the fasting state. He was prepped and draped in the usual sterile fashion. Local anesthesia with 1% lidocaine was administered to the right groin area. Using the modified Seldinger technique a 6 French right common femoral artery sheath was introduced without difficulty. Under fluoroscopic guidance, using 6 French RCB guide catheter, selective cannulation of the the saphenous vein graft bypass to the right coronary was performed. A single small contrast injection was performed which showed total occlusion of the vein graft. Diagnostic procedures  mainly followed by attempted for the cancer breast physician Dr. [name] M.D.  Medications administered:  Versed 1 mg IV, fentanyl 25 mcg IV  Contrast used: 5 mL Omnipaque    IMPRESSIONS:  Acute thrombotic occlusion of recently stented saphenous vein graft bypass with recurrent ST segment elevation infarction  RECOMMENDATION:  Repeat revascularization.     Thurmon Fair, MD, [facility] [phone] office [phone] pager 03/11/2012 6:20 PM

## 2012-03-11 NOTE — Progress Notes (Signed)
ACT 170  Right femoral sheath D/C, 30 minutes of manual pressure held. Groin site began at level 0 and at end of manual pressure, groin remained level 0.  Pressure dressing applied. Educated patient to continue to keep head on the pillow and right leg straight. Right leg is sheeted to the bed to mentally remind patient to keep leg straight. Demonstrated to patient and educated patient with teach back on how to apply pressure if she would to cough, gag and sneeze. Patient is aware to report and signs of bleeding to groin site. Will continue to monitor patient closely regarding vital signs and groin site.

## 2012-03-11 NOTE — Progress Notes (Signed)
Support for family when patient's stents clogged and she had to return to cath lab. Large family in 2900 waiting area.    03/11/12 1800  Clinical Encounter Type  Visited With Family  Visit Type Critical Care  Referral From Other (Comment) (Code Stemi)  Consult/Referral To Chaplain  Recommendations Continue to follow  Spiritual Encounters  Spiritual Needs Emotional  Stress Factors  Family Stress Factors Health changes

## 2012-03-12 ENCOUNTER — Inpatient Hospital Stay (HOSPITAL_COMMUNITY): Payer: Medicare Other

## 2012-03-12 LAB — TROPONIN I
Troponin I: 10.75 ng/mL (ref <0.30)
Troponin I: 10.94 ng/mL (ref <0.30)

## 2012-03-12 LAB — BASIC METABOLIC PANEL
CO2: 23 mEq/L (ref 19–32)
Chloride: 110 mEq/L (ref 96–112)
Creatinine, Ser: 1.33 mg/dL — ABNORMAL HIGH (ref 0.50–1.10)
GFR calc Af Amer: 40 mL/min — ABNORMAL LOW (ref >90)
Potassium: 3.9 mEq/L (ref 3.5–5.1)
Sodium: 141 mEq/L (ref 135–145)

## 2012-03-12 LAB — CK TOTAL AND CKMB (NOT AT ARMC): CK, MB: 69.1 ng/mL (ref 0.3–4.0)

## 2012-03-12 LAB — TYPE AND SCREEN
ABO/RH(D): B POS
Antibody Screen: NEGATIVE

## 2012-03-12 LAB — GLUCOSE, CAPILLARY: Glucose-Capillary: 106 mg/dL — ABNORMAL HIGH (ref 70–99)

## 2012-03-12 MED ORDER — HYDROXYZINE HCL 25 MG PO TABS
25.0000 mg | ORAL_TABLET | Freq: Three times a day (TID) | ORAL | Status: DC | PRN
  Administered 2012-03-12: 25 mg via ORAL
  Filled 2012-03-12 (×2): qty 1

## 2012-03-12 MED FILL — Dextrose Inj 5%: INTRAVENOUS | Qty: 1000 | Status: AC

## 2012-03-12 NOTE — Progress Notes (Signed)
Right femoral arterial sheath discontinued per protocol.  Manual pressure to site for 18 minutes until hemostasis achieved.  Site observed for 4 minutes with no bleeding noted.  Sterile dressing then applied.  Site soft to palpation with no hematoma noted.  Patient tolerated well.

## 2012-03-12 NOTE — Plan of Care (Signed)
Problem: Phase II Progression Outcomes Goal: Vascular site scale level 0 - I Vascular Site Scale Level 0: No bruising/bleeding/hematoma Level I (Mild): Bruising/Ecchymosis, minimal bleeding/ooozing, palpable hematoma < 3 cm Level II (Moderate): Bleeding not affecting hemodynamic parameters, pseudoaneurysm, palpable hematoma > 3 cm    Right Groin Level 0- Pressure Dressing removed  Site open to air- Will continue to monitor

## 2012-03-12 NOTE — Progress Notes (Signed)
[facility]  DAILY PROGRESS NOTE   Subjective:  Feels much better after repeat PCI. No dyspnea or angina at rest. Brief asymptomatic episode of slightly irregular wide complex tachycardia this AM (14 beats at 115 bpm), most likely slow VT/accelerated idioventricular rhythm, less likely AFib with RBBB/LAFB aberrancy pattern.  Objective:  Temp:  [97.5 F (36.4 C)-98 F (36.7 C)] 98 F (36.7 C) (09/02 0800) Pulse Rate:  [61-75] 65  (09/02 0800) Resp:  [15-19] 19  (09/02 0800) BP: (103-135)/(35-54) 113/51 mmHg (09/02 0800) SpO2:  [99 %-100 %] 100 % (09/02 0800) Weight change:   Intake/Output from previous day: 09/01 0701 - 09/02 0700 In: 22.4 [I.V.:22.4] Out: -   Intake/Output from this shift: Total I/O In: 120 [P.O.:120] Out: -   Medications: Current Facility-Administered Medications  Medication Dose Route Frequency Provider Last Rate Last Dose  . 0.9 %  sodium chloride infusion   Intravenous Continuous Herby Abraham, MD 150 mL/hr at 03/12/12 0100    . acetaminophen (TYLENOL) tablet 650 mg  650 mg Oral Q4H PRN Herby Abraham, MD      . allopurinol (ZYLOPRIM) tablet 100 mg  100 mg Oral Daily Herby Abraham, MD      . amiodarone (PACERONE) tablet 200 mg  200 mg Oral Daily Herby Abraham, MD      . aspirin chewable tablet 81 mg  81 mg Oral Daily Herby Abraham, MD   81 mg at 03/11/12 1000  . carvedilol (COREG) tablet 6.25 mg  6.25 mg Oral BID WC Herby Abraham, MD   6.25 mg at 03/11/12 0800  . Chlorhexidine Gluconate Cloth 2 % PADS 6 each  6 each Topical Q0600 Herby Abraham, MD   6 each at 03/12/12 0530  . colesevelam [facility]) tablet 1,875 mg  1,875 mg Oral BID WC Herby Abraham, MD      . ezetimibe (ZETIA) tablet 10 mg  10 mg Oral Daily Herby Abraham, MD      . fentaNYL (SUBLIMAZE) 0.05 MG/ML injection           . glimepiride (AMARYL) tablet 2 mg  2 mg Oral Q breakfast Herby Abraham, MD      . heparin 2-0.9 UNIT/ML-%  infusion           . levothyroxine (SYNTHROID, LEVOTHROID) tablet 100 mcg  100 mcg Oral Daily Herby Abraham, MD      . lidocaine (XYLOCAINE) 1 % injection           . LORazepam (ATIVAN) tablet 0.5 mg  0.5 mg Oral Q8H PRN Herby Abraham, MD   0.5 mg at 03/11/12 0509  . midazolam (VERSED) 2 MG/2ML injection           . mupirocin ointment (BACTROBAN) 2 % 1 application  1 application Nasal BID Herby Abraham, MD   1 application at 03/11/12 2345  . nitroGLYCERIN (NTG ON-CALL) 0.2 mg/mL injection           . ondansetron (ZOFRAN) injection 4 mg  4 mg Intravenous Q6H PRN Herby Abraham, MD   4 mg at 03/11/12 1030  . oxyCODONE (Oxy IR/ROXICODONE) immediate release tablet 5 mg  5 mg Oral Q4H PRN Herby Abraham, MD      . pantoprazole (PROTONIX) EC tablet 40 mg  40 mg Oral Q1200 Herby Abraham, MD   40 mg at 03/11/12 1200  . promethazine (PHENERGAN) tablet 12.5 mg  12.5 mg Oral Q6H PRN Jaray Boliver, MD      . Ticagrelor (BRILINTA) 90 MG tablet           . Ticagrelor (BRILINTA) tablet 90 mg  90 mg Oral BID Herby Abraham, MD      . DISCONTD: 0.9 %  sodium chloride infusion   Intravenous Continuous Herby Abraham, MD 125 mL/hr at 03/10/12 2358 1,000 mL at 03/10/12 2358  . DISCONTD: atropine 1 MG/ML injection           . DISCONTD: bivalirudin (ANGIOMAX) 5 mg/mL in sodium chloride 0.9 % 50 mL infusion  1 mg/kg/hr Intravenous Continuous Herby Abraham, MD      . DISCONTD: clopidogrel (PLAVIX) tablet 75 mg  75 mg Oral Q breakfast Herby Abraham, MD   75 mg at 03/11/12 0800  . DISCONTD: enoxaparin (LOVENOX) injection 30 mg  30 mg Subcutaneous Q24H Herby Abraham, MD      . DISCONTD: lisinopril (PRINIVIL,ZESTRIL) tablet 5 mg  5 mg Oral Daily Aryam Zhan, MD      . DISCONTD: nitroGLYCERIN 0.2 mg/mL in dextrose 5 % infusion  10 mcg/min Intravenous Titrated Herby Abraham, MD 4.5 mL/hr at 03/11/12 0600 15 mcg/min at 03/11/12 0600    Physical Exam: General appearance: alert, cooperative  and no distress Neck: no adenopathy, no carotid bruit, no JVD, supple, symmetrical, trachea midline and thyroid not enlarged, symmetric, no tenderness/mass/nodules Lungs: clear to auscultation bilaterally Heart: regular rate and rhythm, S1, S2 normal, S4 present and no rub Abdomen: soft, non-tender; bowel sounds normal; no masses,  no organomegaly Extremities: right groin without hematoma or bruit Pulses: 2+ and symmetric Skin: Skin color, texture, turgor normal. No rashes or lesions Neurologic: Alert and oriented X 3, normal strength and tone. Normal symmetric reflexes. Normal coordination and gait  Lab Results: Results for orders placed during the hospital encounter of 03/10/12 (from the past 48 hour(s))  GLUCOSE, CAPILLARY     Status: Abnormal   Collection Time   03/10/12 11:36 PM      Component Value Range Comment   Glucose-Capillary 140 (*) 70 - 99 mg/dL   MRSA PCR SCREENING     Status: Abnormal   Collection Time   03/10/12 11:47 PM      Component Value Range Comment   MRSA by PCR POSITIVE (*) NEGATIVE   CBC     Status: Abnormal   Collection Time   03/10/12 11:59 PM      Component Value Range Comment   WBC 7.8  4.0 - 10.5 K/uL    RBC 4.33  3.87 - 5.11 MIL/uL    Hemoglobin 9.7 (*) 12.0 - 15.0 g/dL    HCT 16.1 (*) 09.6 - 46.0 %    MCV 72.1 (*) 78.0 - 100.0 fL    MCH 22.4 (*) 26.0 - 34.0 pg    MCHC 31.1  30.0 - 36.0 g/dL    RDW 04.5 (*) 40.9 - 15.5 %    Platelets 210  150 - 400 K/uL   CREATININE, SERUM     Status: Abnormal   Collection Time   03/10/12 11:59 PM      Component Value Range Comment   Creatinine, Ser 1.58 (*) 0.50 - 1.10 mg/dL    GFR calc non Af Amer 28 (*) >90 mL/min    GFR calc Af Amer 33 (*) >90 mL/min   POCT ACTIVATED CLOTTING TIME     Status: Normal   Collection Time  03/11/12  3:56 AM      Component Value Range Comment   Activated Clotting Time 180     POCT ACTIVATED CLOTTING TIME     Status: Normal   Collection Time   03/11/12  4:58 AM      Component  Value Range Comment   Activated Clotting Time 170     CBC     Status: Abnormal   Collection Time   03/11/12  6:01 AM      Component Value Range Comment   WBC 5.3  4.0 - 10.5 K/uL    RBC 4.40  3.87 - 5.11 MIL/uL    Hemoglobin 9.7 (*) 12.0 - 15.0 g/dL    HCT 16.1 (*) 09.6 - 46.0 %    MCV 73.2 (*) 78.0 - 100.0 fL    MCH 22.0 (*) 26.0 - 34.0 pg    MCHC 30.1  30.0 - 36.0 g/dL    RDW 04.5 (*) 40.9 - 15.5 %    Platelets 211  150 - 400 K/uL   BASIC METABOLIC PANEL     Status: Abnormal   Collection Time   03/11/12  6:01 AM      Component Value Range Comment   Sodium 144  135 - 145 mEq/L    Potassium 3.7  3.5 - 5.1 mEq/L    Chloride 111  96 - 112 mEq/L    CO2 24  19 - 32 mEq/L    Glucose, Bld 110 (*) 70 - 99 mg/dL    BUN 32 (*) 6 - 23 mg/dL    Creatinine, Ser 8.11 (*) 0.50 - 1.10 mg/dL    Calcium 9.2  8.4 - 91.4 mg/dL    GFR calc non Af Amer 28 (*) >90 mL/min    GFR calc Af Amer 33 (*) >90 mL/min   CK TOTAL AND CKMB     Status: Abnormal   Collection Time   03/11/12  6:01 AM      Component Value Range Comment   Total CK 519 (*) 7 - 177 U/L    CK, MB 79.7 (*) 0.3 - 4.0 ng/mL CRITICAL VALUE NOTED.  VALUE IS CONSISTENT WITH PREVIOUSLY REPORTED AND CALLED VALUE.   Relative Index 15.4 (*) 0.0 - 2.5   GLUCOSE, CAPILLARY     Status: Abnormal   Collection Time   03/11/12  7:33 AM      Component Value Range Comment   Glucose-Capillary 115 (*) 70 - 99 mg/dL   GLUCOSE, CAPILLARY     Status: Abnormal   Collection Time   03/11/12 11:42 AM      Component Value Range Comment   Glucose-Capillary 111 (*) 70 - 99 mg/dL    Comment 1 Notify RN     CK TOTAL AND CKMB     Status: Abnormal   Collection Time   03/11/12 12:23 PM      Component Value Range Comment   Total CK 445 (*) 7 - 177 U/L    CK, MB 66.3 (*) 0.3 - 4.0 ng/mL    Relative Index 14.9 (*) 0.0 - 2.5   TROPONIN I     Status: Abnormal   Collection Time   03/11/12 12:23 PM      Component Value Range Comment   Troponin I 16.85 (*) <0.30 ng/mL     GLUCOSE, CAPILLARY     Status: Abnormal   Collection Time   03/11/12  2:44 PM      Component Value Range Comment  Glucose-Capillary 140 (*) 70 - 99 mg/dL    Comment 1 Notify RN     TROPONIN I     Status: Abnormal   Collection Time   03/11/12  7:33 PM      Component Value Range Comment   Troponin I 3.97 (*) <0.30 ng/mL   TROPONIN I     Status: Abnormal   Collection Time   03/11/12 11:29 PM      Component Value Range Comment   Troponin I 10.75 (*) <0.30 ng/mL   TYPE AND SCREEN     Status: Normal   Collection Time   03/11/12 11:29 PM      Component Value Range Comment   ABO/RH(D) B POS      Antibody Screen NEG      Sample Expiration 03/14/2012     CBC     Status: Abnormal   Collection Time   03/11/12 11:37 PM      Component Value Range Comment   WBC 6.6  4.0 - 10.5 K/uL    RBC 4.10  3.87 - 5.11 MIL/uL    Hemoglobin 8.9 (*) 12.0 - 15.0 g/dL    HCT 16.1 (*) 09.6 - 46.0 %    MCV 72.2 (*) 78.0 - 100.0 fL    MCH 21.7 (*) 26.0 - 34.0 pg    MCHC 30.1  30.0 - 36.0 g/dL    RDW 04.5 (*) 40.9 - 15.5 %    Platelets 186  150 - 400 K/uL   BASIC METABOLIC PANEL     Status: Abnormal   Collection Time   03/11/12 11:37 PM      Component Value Range Comment   Sodium 141  135 - 145 mEq/L    Potassium 3.9  3.5 - 5.1 mEq/L    Chloride 110  96 - 112 mEq/L    CO2 23  19 - 32 mEq/L    Glucose, Bld 143 (*) 70 - 99 mg/dL    BUN 24 (*) 6 - 23 mg/dL    Creatinine, Ser 8.11 (*) 0.50 - 1.10 mg/dL    Calcium 9.0  8.4 - 91.4 mg/dL    GFR calc non Af Amer 35 (*) >90 mL/min    GFR calc Af Amer 40 (*) >90 mL/min   GLUCOSE, CAPILLARY     Status: Abnormal   Collection Time   03/11/12 11:38 PM      Component Value Range Comment   Glucose-Capillary 133 (*) 70 - 99 mg/dL   TROPONIN I     Status: Abnormal   Collection Time   03/12/12  6:00 AM      Component Value Range Comment   Troponin I 10.94 (*) <0.30 ng/mL   CK TOTAL AND CKMB     Status: Abnormal   Collection Time   03/12/12  6:00 AM      Component Value  Range Comment   Total CK 431 (*) 7 - 177 U/L    CK, MB 69.1 (*) 0.3 - 4.0 ng/mL CRITICAL VALUE NOTED.  VALUE IS CONSISTENT WITH PREVIOUSLY REPORTED AND CALLED VALUE.   Relative Index 16.0 (*) 0.0 - 2.5     Imaging: Dg Chest Port 1 View  03/12/2012  *RADIOLOGY REPORT*  Clinical Data: Myocardial infarction.  PORTABLE CHEST - 1 VIEW  Comparison: 09/08/2011  Findings: Prior median sternotomy. Numerous leads and wires project over the chest.  Patient rotated minimally right.   Cardiomegaly accentuated by AP portable technique.  Tortuous thoracic aorta. No pleural  effusion or pneumothorax.  Low lung volumes with resultant pulmonary interstitial prominence.  Patchy areas of lower lobe predominant atelectasis. No congestive failure.  IMPRESSION: Cardiomegaly with low lung volumes and patchy bibasilar atelectasis.   Original Report Authenticated By: Consuello Bossier, M.D.     Assessment:  1. Acute inferior wall STEMI, Rxd with emergency PCI 03/10/2012(bare metal stent x 3 to SVG-RCA), complicated by acute stent thrombosis and reintervention (thrombectomy and angioplasty) 03/11/2012; Trop peaked at 17, fell then repeaked at 10-11; check echo 2. Prior inferior wall MI 05/2011 3. CAD p CABG 07/2011 4. Diabetes mellitus type II 5. Chronic kidney disease stage [name] -creatinine better than ever despite repeated contrast exposure 6. History of postop PAF  Plan: Medical therapy from this point onward: she has had three episodes of inferior wall MI and it is unlikely there is a lot of viable inferior wall myocardium at this point, plus the likelihood of long term graft patency appears to be poor. Despite age and renal function Brilinta preferred over clopidogrel due to acute stent thrombosis.  Time Spent Directly with Patient:  30 minutes  Length of Stay:  LOS: 2 days    Garnet Chatmon 03/12/2012, 8:18 AM

## 2012-03-13 ENCOUNTER — Encounter (HOSPITAL_COMMUNITY): Payer: Self-pay | Admitting: Cardiology

## 2012-03-13 DIAGNOSIS — I213 ST elevation (STEMI) myocardial infarction of unspecified site: Secondary | ICD-10-CM | POA: Diagnosis present

## 2012-03-13 LAB — POCT ACTIVATED CLOTTING TIME
Activated Clotting Time: 174 seconds
Activated Clotting Time: 184 seconds
Activated Clotting Time: 209 seconds

## 2012-03-13 LAB — BASIC METABOLIC PANEL
BUN: 25 mg/dL — ABNORMAL HIGH (ref 6–23)
CO2: 23 mEq/L (ref 19–32)
Chloride: 109 mEq/L (ref 96–112)
GFR calc non Af Amer: 27 mL/min — ABNORMAL LOW (ref >90)
Glucose, Bld: 72 mg/dL (ref 70–99)
Potassium: 3.9 mEq/L (ref 3.5–5.1)
Sodium: 141 mEq/L (ref 135–145)

## 2012-03-13 LAB — CBC
HCT: 30.9 % — ABNORMAL LOW (ref 36.0–46.0)
Hemoglobin: 9.6 g/dL — ABNORMAL LOW (ref 12.0–15.0)
MCH: 22.4 pg — ABNORMAL LOW (ref 26.0–34.0)
MCHC: 31.1 g/dL (ref 30.0–36.0)
MCV: 72.2 fL — ABNORMAL LOW (ref 78.0–100.0)
RBC: 4.28 MIL/uL (ref 3.87–5.11)

## 2012-03-13 LAB — TROPONIN I: Troponin I: 9.92 ng/mL (ref <0.30)

## 2012-03-13 LAB — GLUCOSE, CAPILLARY
Glucose-Capillary: 81 mg/dL (ref 70–99)
Glucose-Capillary: 78 mg/dL (ref 70–99)

## 2012-03-13 MED ORDER — ISOSORBIDE MONONITRATE ER 30 MG PO TB24
30.0000 mg | ORAL_TABLET | Freq: Every day | ORAL | Status: DC
  Administered 2012-03-13 – 2012-03-15 (×3): 30 mg via ORAL
  Filled 2012-03-13 (×3): qty 1

## 2012-03-13 NOTE — Progress Notes (Signed)
  Echocardiogram 2D Echocardiogram has been performed.  Imagine Nest 03/13/2012, 3:21 PM

## 2012-03-13 NOTE — Progress Notes (Signed)
Events noted. Please advise on ambulation today. Thx. Ethelda Chick CES, ACSM

## 2012-03-13 NOTE — Progress Notes (Signed)
Subjective: No recurrent chest pain  Objective: Vital signs in last 24 hours: Temp:  [97.6 F (36.4 C)-98.6 F (37 C)] 97.8 F (36.6 C) (09/03 0400) Pulse Rate:  [67-76] 76  (09/03 0604) Resp:  [16-18] 18  (09/03 0604) BP: (95-132)/(32-103) 123/48 mmHg (09/03 0604) SpO2:  [95 %-99 %] 97 % (09/03 0604) Weight change:  Last BM Date: 03/09/12 Intake/Output from previous day: +110  Wt 78 09/02 0701 - 09/03 0700 In: 360 [P.O.:360] Out: 250 [Urine:250] Intake/Output this shift:    PE: General: NAD Neck: no JVD Heart: RRR 1/6 sem Lungs: no rales or wheezes Abd: soft, BS+ Cath site stable Ext: trace to 1+ edema Neuro: nonfocal   Lab Results:  [facility] 03/11/12 2337 03/11/12 0601  WBC 6.6 5.3  HGB 8.9* 9.7*  HCT 29.6* 32.2*  PLT 186 211   BMET  Basename 03/11/12 2337 03/11/12 0601  NA 141 144  K 3.9 3.7  CL 110 111  CO2 23 24  GLUCOSE 143* 110*  BUN 24* 32*  CREATININE 1.33* 1.58*  CALCIUM 9.0 9.2   Cardiac Panel (last 3 results)  Basename 03/13/12 1116 03/13/12 0558 03/12/12 2318 03/12/12 0600 03/11/12 1223 03/11/12 0601  CKTOTAL -- -- -- 431* 445* 519*  CKMB -- -- -- 69.1* 66.3* 79.7*  TROPONINI 9.92* 10.76* 11.88* -- -- --  RELINDX -- -- -- 16.0* 14.9* 15.4*    Basename 03/13/12 0558 03/12/12 2318  TROPONINI 10.76* 11.88*   BNP (last 3 results)  Basename 07/30/11 1150 05/31/11 0555  PROBNP 144.5 800.6*    Lab Results  Component Value Date   CHOL 169 07/31/2011   HDL 68 07/31/2011   LDLCALC 88 07/31/2011   TRIG 64 07/31/2011   CHOLHDL 2.5 07/31/2011   Lab Results  Component Value Date   HGBA1C 5.9* 07/30/2011     Lab Results  Component Value Date   TSH 1.618 07/30/2011    Hepatic Function Panel  Basename 03/10/12 2107  PROT 6.8  ALBUMIN 3.8  AST 25  ALT 15  ALKPHOS 104  BILITOT 0.3  BILIDIR --  IBILI --   No results found for this basename: CHOL in the last 72 hours No results found for this basename: PROTIME in the last 72  hours    EKG: Orders placed during the hospital encounter of 03/10/12  . EKG 12-LEAD  . EKG 12-LEAD  . EKG 12-LEAD  . EKG 12-LEAD  . EKG 12-LEAD  . EKG 12-LEAD  . EKG 12-LEAD  . EKG 12-LEAD  . EKG 12-LEAD  . EKG 12-LEAD  . EKG 12-LEAD  . EKG 12-LEAD  . EKG 12-LEAD  . EKG 12-LEAD  . EKG 12-LEAD  . EKG 12-LEAD    Studies/Results: Dg Chest Port 1 View  03/12/2012  *RADIOLOGY REPORT*  Clinical Data: Myocardial infarction.  PORTABLE CHEST - 1 VIEW  Comparison: 09/08/2011  Findings: Prior median sternotomy. Numerous leads and wires project over the chest.  Patient rotated minimally right.   Cardiomegaly accentuated by AP portable technique.  Tortuous thoracic aorta. No pleural effusion or pneumothorax.  Low lung volumes with resultant pulmonary interstitial prominence.  Patchy areas of lower lobe predominant atelectasis. No congestive failure.  IMPRESSION: Cardiomegaly with low lung volumes and patchy bibasilar atelectasis.   Original Report Authenticated By: Consuello Bossier, M.D.     Medications: I have reviewed the patient's current medications.    Marland Kitchen allopurinol  100 mg Oral Daily  . amiodarone  200 mg Oral Daily  .  aspirin  81 mg Oral Daily  . carvedilol  6.25 mg Oral BID WC  . Chlorhexidine Gluconate Cloth  6 each Topical Q0600  . colesevelam  1,875 mg Oral BID WC  . ezetimibe  10 mg Oral Daily  . glimepiride  2 mg Oral Q breakfast  . levothyroxine  100 mcg Oral Daily  . mupirocin ointment  1 application Nasal BID  . pantoprazole  40 mg Oral Q1200  . Ticagrelor  90 mg Oral BID   Assessment/Plan:  Principal Problem:  *STEMI (ST elevation myocardial infarction), inf wall. and recurrent STEMI 03/12/12 Active Problems:  CAD, multiple vessel, severe disease, no stents placed only angioplasty to mid RCA.   Dyslipidemia  Renal insufficiency  Hypothyroidism  Diabetes type 2, controlled  S/P CABG x 3,LIMA - sequential prox-mid LAD, SVG-OM, SVG- RPDA, 08/05/11, now emergenty  stents X 3 to SVG to RCA, 03/11/12 and Emergent PTVA and asp. throbectomy 03/12/12  PLAN:  MD to see, pt with STEMI of Inf. Wall 03/11/12 and recurrent episode due to clot in VG to RCA. Will check stat BMP, CBC  LOS: 3 days   INGOLD,LAURA R 03/13/2012, 8:05 AM  Patient seen and examined. Agree with assessment and plan. No recurrent chest pain; s/p MI secondary to SVG occlusion requiring repeat re-intervention due to recurrent thrombosis. Will begin ambulation. Will add low dose nitrates with concomitant CAD.  If renal function continues to improve recommend starting very dose ACE-I possibly tomorrow.    Lennette Bihari, MD, [facility] 03/13/2012 2:05 PM

## 2012-03-13 NOTE — Progress Notes (Signed)
Inpatient Diabetes Program Recommendations  AACE/ADA: New Consensus Statement on Inpatient Glycemic Control (2013)  Target Ranges:  Prepandial:   less than 140 mg/dL      Peak postprandial:   less than 180 mg/dL (1-2 hours)      Critically ill patients:  140 - 180 mg/dL   Consult received.  Last A1C=5.20 July 2011.  CBGs are WNL.  No further recommendations at this time.    Thank you  Piedad Climes [facility] Inpatient Diabetes Coordinator [phone]

## 2012-03-13 NOTE — Care Management Note (Addendum)
    Page 1 of 1   03/15/2012     1:51:16 PM   CARE MANAGEMENT NOTE 03/15/2012  Patient:  [name], [name]   Account Number:  [id number]  Date Initiated:  03/13/2012  Documentation initiated by:  Junius Creamer  Subjective/Objective Assessment:   adm w mi     Action/Plan:   lives w husband, pcp dr Adela Lank, hx of hhc w adv homecare   Anticipated DC Date:  03/15/2012   Anticipated DC Plan:  HOME/SELF CARE      DC Planning Services  CM consult      Choice offered to / List presented to:             Status of service:  Completed, signed off Medicare Important Message given?   (If response is "NO", the following Medicare IM given date fields will be blank) Date Medicare IM given:   Date Additional Medicare IM given:    Discharge Disposition:  HOME/SELF CARE  Per UR Regulation:  Reviewed for med. necessity/level of care/duration of stay  If discussed at Long Length of Stay Meetings, dates discussed:    Comments:  03-15-12 12noon Avie Arenas, RNBSN [phone] Talked with patient on dc needs.  Denies any needs at this time.  Given Brilenta card for discount.  Family calling with insurance info to get benefit from discount card.  9/4 10a debbie dowell rn,bsn pt gave me prermission to speak w da who had called to discuss dc planning. da caroline jones  [phone] just had shoulder surg. son lives from pt but he and his wife work. son provides transp. husb of pt has mild dementia. pt plans on returning home w husb. she uses cane at home.  9/3 14:15p debbie dowell rn,bns [phone] pt has 45.00 copay for brilinta and no prior auth req.

## 2012-03-13 NOTE — Progress Notes (Signed)
CARDIAC REHAB PHASE I   PRE:  Rate/Rhythm: 73 SR    BP: sitting 129/56    SaO2:   MODE:  Ambulation: 220 ft   POST:  Rate/Rhythm: 94 SR    BP: sitting 124/69     SaO2:   Tolerated well assist x1 with RW. Quick pace then when she rested slightly SOB. To bed after walk. Denied sx. Will f/u. [phone]  [name] CES, ACSM

## 2012-03-13 NOTE — Consult Note (Signed)
Aware of patient, films reviewed. Long recovery after CABG for left main disease in Jan. 2013 including readmission for leg wound infection. Appears stable now.    [name] MD  Beeper [phone] Office [phone] 03/13/2012 5:45 PM

## 2012-03-14 LAB — BASIC METABOLIC PANEL
BUN: 26 mg/dL — ABNORMAL HIGH (ref 6–23)
GFR calc Af Amer: 32 mL/min — ABNORMAL LOW (ref >90)
GFR calc non Af Amer: 27 mL/min — ABNORMAL LOW (ref >90)
Potassium: 3.9 mEq/L (ref 3.5–5.1)

## 2012-03-14 LAB — TSH: TSH: 3.83 u[IU]/mL (ref 0.350–4.500)

## 2012-03-14 LAB — CBC
Hemoglobin: 8.5 g/dL — ABNORMAL LOW (ref 12.0–15.0)
MCHC: 30.7 g/dL (ref 30.0–36.0)
RDW: 22.3 % — ABNORMAL HIGH (ref 11.5–15.5)
WBC: 6.5 10*3/uL (ref 4.0–10.5)

## 2012-03-14 LAB — TROPONIN I
Troponin I: 6.29 ng/mL (ref <0.30)
Troponin I: 5.29 ng/mL (ref <0.30)

## 2012-03-14 LAB — POCT ACTIVATED CLOTTING TIME
Activated Clotting Time: 429 seconds
Activated Clotting Time: 334 seconds

## 2012-03-14 LAB — GLUCOSE, CAPILLARY
Glucose-Capillary: 106 mg/dL — ABNORMAL HIGH (ref 70–99)
Glucose-Capillary: 110 mg/dL — ABNORMAL HIGH (ref 70–99)

## 2012-03-14 NOTE — Progress Notes (Signed)
Subjective:  No chest pain  Objective:  Vital Signs in the last 24 hours: Temp:  [97.8 F (36.6 C)-99.1 F (37.3 C)] 97.8 F (36.6 C) (09/04 0700) Pulse Rate:  [76-85] 82  (09/04 0700) Resp:  [14-20] 18  (09/04 0700) BP: (89-144)/(39-85) 144/85 mmHg (09/04 0745) SpO2:  [96 %-99 %] 97 % (09/04 0745)  Intake/Output from previous day:  Intake/Output Summary (Last 24 hours) at 03/14/12 1009 Last data filed at 03/14/12 0700  Gross per 24 hour  Intake    700 ml  Output    525 ml  Net    175 ml    Physical Exam: General appearance: alert, cooperative and no distress Lungs: clear to auscultation bilaterally Heart: regular rate and rhythm Rt groin without hematoma   Rate: 78  Rhythm: normal sinus rhythm  Lab Results:  Basename 03/14/12 0558 03/13/12 0824  WBC 6.5 8.2  HGB 8.5* 9.6*  PLT 168 169    Basename 03/14/12 0558 03/13/12 0824  NA 139 141  K 3.9 3.9  CL 109 109  CO2 21 23  GLUCOSE 75 72  BUN 26* 25*  CREATININE 1.61* 1.64*    Basename 03/14/12 0558 03/14/12 0022  TROPONINI 6.29* 6.33*   Hepatic Function Panel No results found for this basename: PROT,ALBUMIN,AST,ALT,ALKPHOS,BILITOT,BILIDIR,IBILI in the last 72 hours No results found for this basename: CHOL in the last 72 hours No results found for this basename: INR in the last 72 hours  Imaging: Imaging results have been reviewed  Cardiac Studies:  Assessment/Plan:   Principal Problem:  *STEMI, 03/10/12 Rx'd with PCI with recurrance 03/11/12 requiring re intervention  Active Problems:  CAD, SVG-RCA  PCI with BMS X 3 on 8/31 with re-occlusion 24hrs later- re do PCI  Renal insufficiency  Diabetes type 2, controlled  S/P CABG x 3,LIMA - sequential prox-mid LAD, SVG-OM, SVG- RPDA, 08/05/11, now emergenty stents X 3 to SVG to RCA, 03/11/12 and Emergent PTVA and asp. throbectomy 03/12/12  PAF post CABG. Holding NSR on Amiodarone  Dyslipidemia  Hypothyroidism  History of gout  Anemia, Hgb down to  8.5   Plan- Transfer to telemetry. Troponin now trending down. Check stools and follow up Hgb. Check LFTs and TSH as she has been on Amiodarone for > 6mos.   [name] PA-C 03/14/2012, 10:09 AM    Agree with note written by [name] PAC  H/O CAD, S/P CABG 1/13. Admitted with NSTEMI. Dr. Zara Chess did intervention on RCA SVG which had SAT the following day requiring reintervention. Peak Trop 6. No CP. Exam benign. Ambulating w/o difficulty. Pt lives with her husband and is independent at home. Prob OK for D/C home tomorrow.  Runell Gess 03/14/2012 4:27 PM

## 2012-03-14 NOTE — Progress Notes (Signed)
CARDIAC REHAB PHASE I   PRE:  Rate/Rhythm: 73 SR    BP: sitting 124/54    SaO2:   MODE:  Ambulation: 220 ft   POST:  Rate/Rhythm: 94 SR    BP: sitting 140/51     SaO2: 96 RA  DOE with distance, more noticeable when she stops and rests. x1 rest stop. Tolerated about the same as yesterday. Stands up well without assistance. Does well with RW. Will f/u. [phone]  Harriet Masson CES, ACSM

## 2012-03-15 DIAGNOSIS — I255 Ischemic cardiomyopathy: Secondary | ICD-10-CM | POA: Diagnosis present

## 2012-03-15 LAB — TROPONIN I
Troponin I: 2.26 ng/mL (ref <0.30)
Troponin I: 3.83 ng/mL (ref <0.30)
Troponin I: 3.94 ng/mL (ref <0.30)

## 2012-03-15 LAB — CBC
HCT: 28 % — ABNORMAL LOW (ref 36.0–46.0)
Hemoglobin: 8.6 g/dL — ABNORMAL LOW (ref 12.0–15.0)
MCH: 21.9 pg — ABNORMAL LOW (ref 26.0–34.0)
MCHC: 30.7 g/dL (ref 30.0–36.0)
MCV: 71.4 fL — ABNORMAL LOW (ref 78.0–100.0)
Platelets: 159 10*3/uL (ref 150–400)
RBC: 3.92 MIL/uL (ref 3.87–5.11)
RDW: 22.5 % — ABNORMAL HIGH (ref 11.5–15.5)
WBC: 5.3 10*3/uL (ref 4.0–10.5)

## 2012-03-15 LAB — COMPREHENSIVE METABOLIC PANEL
ALT: 14 U/L (ref 0–35)
AST: 19 U/L (ref 0–37)
Albumin: 2.5 g/dL — ABNORMAL LOW (ref 3.5–5.2)
Alkaline Phosphatase: 70 U/L (ref 39–117)
BUN: 22 mg/dL (ref 6–23)
CO2: 23 mEq/L (ref 19–32)
Calcium: 9.3 mg/dL (ref 8.4–10.5)
Chloride: 109 mEq/L (ref 96–112)
Creatinine, Ser: 1.46 mg/dL — ABNORMAL HIGH (ref 0.50–1.10)
GFR calc Af Amer: 36 mL/min — ABNORMAL LOW (ref >90)
GFR calc non Af Amer: 31 mL/min — ABNORMAL LOW (ref >90)
Glucose, Bld: 74 mg/dL (ref 70–99)
Potassium: 4.1 mEq/L (ref 3.5–5.1)
Sodium: 140 mEq/L (ref 135–145)
Total Bilirubin: 0.1 mg/dL — ABNORMAL LOW (ref 0.3–1.2)
Total Protein: 4.9 g/dL — ABNORMAL LOW (ref 6.0–8.3)

## 2012-03-15 LAB — GLUCOSE, CAPILLARY

## 2012-03-15 MED ORDER — ISOSORBIDE MONONITRATE ER 30 MG PO TB24: 30.0000 mg | ORAL_TABLET | Freq: Every day | ORAL | Status: DC

## 2012-03-15 MED ORDER — TICAGRELOR 90 MG PO TABS: 90.0000 mg | ORAL_TABLET | Freq: Two times a day (BID) | ORAL | Status: DC

## 2012-03-15 MED ORDER — AMIODARONE HCL 200 MG PO TABS: 200.0000 mg | ORAL_TABLET | Freq: Every day | ORAL | Status: DC

## 2012-03-15 NOTE — Discharge Summary (Signed)
Patient ID: [name],  MRN: [id number], DOB/AGE: 76-Feb-1922 76 y.o.  Admit date: 03/10/2012 Discharge date: 03/15/2012  Primary Care Provider: Dr Juleen China Primary Cardiologist: Dr Herbie Baltimore  Discharge Diagnoses Principal Problem:  *STEMI, 03/10/12 Rx'd with PCI with recurrance 03/11/12 Active Problems:  CAD, SVG-RCA  PCI with BMS X 3 on 8/31 with re-occlusion 24hrs later- re do PCI  Renal insufficiency  Diabetes type 2, controlled  S/P CABG x 3, LIMA - LAD, SVG-OM, SVG- PDA 08/05/11  PAF post CABG. Holding NSR on Amiodarone  Anemia, post PCI   Dyslipidemia  Hypothyroidism  History of gout  Ischemic cardiomyopathy, EF 45-50% 2D 03/13/12  HTN (hypertension)    Procedures: Urgent Cath/ PCI with BMS to SVG-RCA 03/10/12                         Re cath and re intervention secondary to early [facility] 03/11/12   Hospital Course: This is an 78 y.o. female with a past medical history significant for multivessel coronary artery disease, status post acute inferior wall myocardial infarction treated with balloon angioplasty only in November of 2012, subsequently status post coronary bypass surgery in January 2013 (Dr. Kathlee Nations Trigt, LIMA to LAD, SVG to OM, SVG to PDA). She presented to the ER 03/10/12 with sudden onset of anginal chest pain around 1600 hours followed by an apparent syncopal event. She remembers going to the bathroom and then was found lying on the floor unaware that she had lost consciousness. ECG showed roughly 1-2 mm ST segment elevation predominantly in the inferior leads but also these V4 through V6 with reciprocal changes in leads 1 and aVL. She underwent emergency cardiac catheterization and percutaneous revascularization with BMS to the SVG-RCA by Dr Riley Kill 03/10/12. She tolerated this well but around 5pm on the 1st she had recurrent chest pain and was taken back to the lab by Dr Royann Shivers. Cath revealed ISR and she was intervened on again by Dr Riley Kill. Her Troponin peaked at 13.57 on  the 2nd. EF by echo on the 3d was 45-50%. She has CRI and her SCr did go up to 1.70 but is down to 1.46 at discharge. She also has post procedural anemia with a Hgb of 8.6 at discharge. She was transferred to telemetry and ambulated without problems. We feel she can be discharged 03/15/12.    Discharge Vitals:  Blood pressure 148/62, pulse 69, temperature 97.4 F (36.3 C), temperature source Oral, resp. rate 18, height 5\' 1"  (1.549 m), weight 84.5 kg (186 lb 4.6 oz), SpO2 100.00%.    Labs: Results for orders placed during the hospital encounter of 03/10/12 (from the past 48 hour(s))  TROPONIN I     Status: Abnormal   Collection Time   03/13/12 11:16 AM      Component Value Range Comment   Troponin I 9.92 (*) <0.30 ng/mL   GLUCOSE, CAPILLARY     Status: Normal   Collection Time   03/13/12 12:56 PM      Component Value Range Comment   Glucose-Capillary 77  70 - 99 mg/dL   GLUCOSE, CAPILLARY     Status: Normal   Collection Time   03/13/12  5:52 PM      Component Value Range Comment   Glucose-Capillary 78  70 - 99 mg/dL   TROPONIN I     Status: Abnormal   Collection Time   03/13/12  9:58 PM      Component Value Range Comment  Troponin I 5.13 (*) <0.30 ng/mL   TROPONIN I     Status: Abnormal   Collection Time   03/14/12 12:22 AM      Component Value Range Comment   Troponin I 6.33 (*) <0.30 ng/mL   BASIC METABOLIC PANEL     Status: Abnormal   Collection Time   03/14/12  5:58 AM      Component Value Range Comment   Sodium 139  135 - 145 mEq/L    Potassium 3.9  3.5 - 5.1 mEq/L    Chloride 109  96 - 112 mEq/L    CO2 21  19 - 32 mEq/L    Glucose, Bld 75  70 - 99 mg/dL    BUN 26 (*) 6 - 23 mg/dL    Creatinine, Ser 4.09 (*) 0.50 - 1.10 mg/dL    Calcium 8.9  8.4 - 81.1 mg/dL    GFR calc non Af Amer 27 (*) >90 mL/min    GFR calc Af Amer 32 (*) >90 mL/min   CBC     Status: Abnormal   Collection Time   03/14/12  5:58 AM      Component Value Range Comment   WBC 6.5  4.0 - 10.5 K/uL    RBC  3.92  3.87 - 5.11 MIL/uL    Hemoglobin 8.5 (*) 12.0 - 15.0 g/dL    HCT 91.4 (*) 78.2 - 46.0 %    MCV 70.7 (*) 78.0 - 100.0 fL    MCH 21.7 (*) 26.0 - 34.0 pg    MCHC 30.7  30.0 - 36.0 g/dL    RDW 95.6 (*) 21.3 - 15.5 %    Platelets 168  150 - 400 K/uL   TROPONIN I     Status: Abnormal   Collection Time   03/14/12  5:58 AM      Component Value Range Comment   Troponin I 6.29 (*) <0.30 ng/mL   GLUCOSE, CAPILLARY     Status: Normal   Collection Time   03/14/12  6:39 AM      Component Value Range Comment   Glucose-Capillary 71  70 - 99 mg/dL   TROPONIN I     Status: Abnormal   Collection Time   03/14/12 11:22 AM      Component Value Range Comment   Troponin I 5.29 (*) <0.30 ng/mL   TSH     Status: Normal   Collection Time   03/14/12 11:22 AM      Component Value Range Comment   TSH 3.830  0.350 - 4.500 uIU/mL   GLUCOSE, CAPILLARY     Status: Normal   Collection Time   03/14/12 11:48 AM      Component Value Range Comment   Glucose-Capillary 97  70 - 99 mg/dL   GLUCOSE, CAPILLARY     Status: Abnormal   Collection Time   03/14/12 12:40 PM      Component Value Range Comment   Glucose-Capillary 104 (*) 70 - 99 mg/dL    Comment 1 Documented in Chart      Comment 2 Notify RN     GLUCOSE, CAPILLARY     Status: Abnormal   Collection Time   03/14/12  4:30 PM      Component Value Range Comment   Glucose-Capillary 106 (*) 70 - 99 mg/dL    Comment 1 Documented in Chart      Comment 2 Notify RN     TROPONIN I     Status:  Abnormal   Collection Time   03/14/12  7:54 PM      Component Value Range Comment   Troponin I 4.40 (*) <0.30 ng/mL   GLUCOSE, CAPILLARY     Status: Abnormal   Collection Time   03/14/12  9:51 PM      Component Value Range Comment   Glucose-Capillary 110 (*) 70 - 99 mg/dL    Comment 1 Notify RN     TROPONIN I     Status: Abnormal   Collection Time   03/14/12 11:47 PM      Component Value Range Comment   Troponin I 3.94 (*) <0.30 ng/mL   COMPREHENSIVE METABOLIC PANEL      Status: Abnormal   Collection Time   03/15/12  6:00 AM      Component Value Range Comment   Sodium 140  135 - 145 mEq/L    Potassium 4.1  3.5 - 5.1 mEq/L    Chloride 109  96 - 112 mEq/L    CO2 23  19 - 32 mEq/L    Glucose, Bld 74  70 - 99 mg/dL    BUN 22  6 - 23 mg/dL    Creatinine, Ser 6.57 (*) 0.50 - 1.10 mg/dL    Calcium 9.3  8.4 - 84.6 mg/dL    Total Protein 4.9 (*) 6.0 - 8.3 g/dL    Albumin 2.5 (*) 3.5 - 5.2 g/dL    AST 19  0 - 37 U/L    ALT 14  0 - 35 U/L    Alkaline Phosphatase 70  39 - 117 U/L    Total Bilirubin 0.1 (*) 0.3 - 1.2 mg/dL    GFR calc non Af Amer 31 (*) >90 mL/min    GFR calc Af Amer 36 (*) >90 mL/min   CBC     Status: Abnormal   Collection Time   03/15/12  6:00 AM      Component Value Range Comment   WBC 5.3  4.0 - 10.5 K/uL    RBC 3.92  3.87 - 5.11 MIL/uL    Hemoglobin 8.6 (*) 12.0 - 15.0 g/dL    HCT 96.2 (*) 95.2 - 46.0 %    MCV 71.4 (*) 78.0 - 100.0 fL    MCH 21.9 (*) 26.0 - 34.0 pg    MCHC 30.7  30.0 - 36.0 g/dL    RDW 84.1 (*) 32.4 - 15.5 %    Platelets 159  150 - 400 K/uL   TROPONIN I     Status: Abnormal   Collection Time   03/15/12  6:00 AM      Component Value Range Comment   Troponin I 3.83 (*) <0.30 ng/mL   GLUCOSE, CAPILLARY     Status: Normal   Collection Time   03/15/12  6:04 AM      Component Value Range Comment   Glucose-Capillary 82  70 - 99 mg/dL     Disposition:  Follow-up Information    Follow up with HARDING,DAVID W, MD. (office will call)    Contact information:   [facility] [address] [place] [address] [phone]          Discharge Medications:  Medication List  As of 03/15/2012 10:44 AM   TAKE these medications         allopurinol 100 MG tablet   Commonly known as: ZYLOPRIM   Take 100 mg by mouth daily.      amiodarone 200  MG tablet   Commonly known as: PACERONE   Take 1 tablet (200 mg total) by mouth daily.      aspirin 81 MG tablet   Take 81 mg by mouth  daily.      carvedilol 6.25 MG tablet   Commonly known as: COREG   Take 6.25 mg by mouth 2 (two) times daily with a meal.      colesevelam 625 MG tablet   Commonly known as: WELCHOL   Take 3 tablets (1,875 mg total) by mouth 2 (two) times daily with a meal.      ezetimibe 10 MG tablet   Commonly known as: ZETIA   Take 1 tablet (10 mg total) by mouth daily.      furosemide 20 MG tablet   Commonly known as: LASIX   Take 40 mg by mouth daily.      glimepiride 2 MG tablet   Commonly known as: AMARYL   Take 2 mg by mouth daily with breakfast.      isosorbide mononitrate 30 MG 24 hr tablet   Commonly known as: IMDUR   Take 1 tablet (30 mg total) by mouth daily.      levothyroxine 100 MCG tablet   Commonly known as: SYNTHROID, LEVOTHROID   Take 100 mcg by mouth daily.      LORazepam 0.5 MG tablet   Commonly known as: ATIVAN   Take 0.5 mg by mouth every 8 (eight) hours as needed. For anxiety      pantoprazole 40 MG tablet   Commonly known as: PROTONIX   Take 1 tablet (40 mg total) by mouth daily at 12 noon.      sodium bicarbonate 650 MG tablet   Take 650 mg by mouth daily.      Ticagrelor 90 MG Tabs tablet   Commonly known as: BRILINTA   Take 1 tablet (90 mg total) by mouth 2 (two) times daily.             Duration of Discharge Encounter: Greater than 30 minutes including physician time.  Jolene Provost PA-C 03/15/2012 10:44 AM

## 2012-03-15 NOTE — Progress Notes (Signed)
Discussed with pt and family restrictions, walking daily, NTG. Voiced understanding.  [id number] Ethelda Chick CES, ACSM

## 2012-03-15 NOTE — Progress Notes (Signed)
Subjective:  No complaints, up without problems  Objective:  Vital Signs in the last 24 hours: Temp:  [97.4 F (36.3 C)-98.9 F (37.2 C)] 97.4 F (36.3 C) (09/05 0603) Pulse Rate:  [69-80] 69  (09/05 0603) Resp:  [16-18] 18  (09/05 0603) BP: (119-150)/(58-74) 148/62 mmHg (09/05 0603) SpO2:  [96 %-100 %] 100 % (09/05 0603) Weight:  [84.5 kg (186 lb 4.6 oz)] 84.5 kg (186 lb 4.6 oz) (09/05 0603)  Intake/Output from previous day:  Intake/Output Summary (Last 24 hours) at 03/15/12 1029 Last data filed at 03/15/12 0600  Gross per 24 hour  Intake    720 ml  Output    350 ml  Net    370 ml    Physical Exam: General appearance: alert, cooperative and no distress Lungs: clear to auscultation bilaterally Heart: regular rate and rhythm   Rate: 75  Rhythm: normal sinus rhythm  Lab Results:  Basename 03/15/12 0600 03/14/12 0558  WBC 5.3 6.5  HGB 8.6* 8.5*  PLT 159 168    Basename 03/15/12 0600 03/14/12 0558  NA 140 139  K 4.1 3.9  CL 109 109  CO2 23 21  GLUCOSE 74 75  BUN 22 26*  CREATININE 1.46* 1.61*    Basename 03/15/12 0600 03/14/12 2347  TROPONINI 3.83* 3.94*   Hepatic Function Panel  Basename 03/15/12 0600  PROT 4.9*  ALBUMIN 2.5*  AST 19  ALT 14  ALKPHOS 70  BILITOT 0.1*  BILIDIR --  IBILI --   No results found for this basename: CHOL in the last 72 hours No results found for this basename: INR in the last 72 hours  Imaging: Imaging results have been reviewed  Cardiac Studies:  Assessment/Plan:   Principal Problem:  *STEMI, 03/10/12 Rx'd with PCI SVG-RCA with recurrance 03/11/12  CAD, SVG-RCA  PCI with BMS X 3 on 8/31 with re-occlusion 24hrs later- re do PC  Active Problems: I S/P CABG x 3,LIMA - LAD, SVG-OM, SVG- PDA, 08/05/11.  Renal insufficiency, SCr stable- 1.46  Diabetes type 2, controlled  PAF post CABG. Holding NSR on Amiodarone  Anemia, post PCI, Hgb stable at discharge 8.6  Dyslipidemia  Hypothyroidism  History of gout   HTN   Plan-Discharge, follow up with Dr Honor Loh [facility] PA-C 03/15/2012, 10:29 AM

## 2012-03-15 NOTE — Progress Notes (Signed)
Pt. Seen and examined. Agree with the NP/PA-C note as written.  Kenneth C. Hilty, MD, FACC Attending Cardiologist [facility]   

## 2012-07-13 ENCOUNTER — Other Ambulatory Visit: Payer: Self-pay | Admitting: Endocrinology

## 2012-07-13 DIAGNOSIS — Z1231 Encounter for screening mammogram for malignant neoplasm of breast: Secondary | ICD-10-CM

## 2012-08-03 ENCOUNTER — Ambulatory Visit
Admission: RE | Admit: 2012-08-03 | Discharge: 2012-08-03 | Disposition: A | Discharge status: Home / Self Care | Payer: Medicare Other | Source: Ambulatory Visit | Attending: Endocrinology | Admitting: Endocrinology

## 2012-08-03 DIAGNOSIS — Z1231 Encounter for screening mammogram for malignant neoplasm of breast: Secondary | ICD-10-CM

## 2013-01-30 ENCOUNTER — Encounter: Payer: Self-pay | Admitting: Cardiology

## 2013-01-30 ENCOUNTER — Ambulatory Visit (INDEPENDENT_AMBULATORY_CARE_PROVIDER_SITE_OTHER): Payer: Medicare Other | Admitting: Cardiology

## 2013-01-30 VITALS — BP 134/64 | HR 64 | Ht 61.0 in | Wt 178.4 lb

## 2013-01-30 DIAGNOSIS — I4891 Unspecified atrial fibrillation: Secondary | ICD-10-CM

## 2013-01-30 DIAGNOSIS — I2589 Other forms of chronic ischemic heart disease: Secondary | ICD-10-CM

## 2013-01-30 DIAGNOSIS — I255 Ischemic cardiomyopathy: Secondary | ICD-10-CM

## 2013-01-30 DIAGNOSIS — E785 Hyperlipidemia, unspecified: Secondary | ICD-10-CM

## 2013-01-30 DIAGNOSIS — I10 Essential (primary) hypertension: Secondary | ICD-10-CM

## 2013-01-30 DIAGNOSIS — I2510 Atherosclerotic heart disease of native coronary artery without angina pectoris: Secondary | ICD-10-CM

## 2013-01-30 DIAGNOSIS — I480 Paroxysmal atrial fibrillation: Secondary | ICD-10-CM

## 2013-01-30 DIAGNOSIS — E669 Obesity, unspecified: Secondary | ICD-10-CM

## 2013-01-30 NOTE — Patient Instructions (Addendum)
Your physician recommends that you schedule a follow-up appointment in: 6 months with Dr. Harding.  Your physician recommends that you continue on your current medications as directed. Please refer to the Current Medication list given to you today.  

## 2013-02-15 ENCOUNTER — Encounter: Payer: Self-pay | Admitting: Cardiology

## 2013-02-15 DIAGNOSIS — E669 Obesity, unspecified: Secondary | ICD-10-CM | POA: Insufficient documentation

## 2013-02-15 NOTE — Progress Notes (Signed)
Patient ID: [name], female   DOB: [birth year], 79 y.o.   MRN: [id number] PCP: Michiel Sites, MD  Clinic Note: Chief Complaint  Patient presents with  . 6 month visit    no chest pain, just time with moving around sob,edema but not as much,she has been wearing her compression hose    HPI: [name] is a 79 y.o. female with a PMH below who presents today for followup of her complex coronary disease history. I first met Mrs. Proctor in November of 2012 when she presented with an inferior STEMI with RCA occlusion in the setting of severe left main disease. Initially I treated this with balloon angioplasty to allow for the possibility of followup CABG. After much deliberation, she was discharged with POBA alone to the occluded RCA with a suboptimal result. She initially did relatively well until January of 2013, when she presented with non-ST elevation MI. That time she was seen for a consultation by Dr. Tyrone Sage, who agreed the patient to perform three-vessel CABG (LIMA-LAD, SVG-OM, SVG-PDA) and after long, drawn at recovery, given by 8 postop A. fib and possible pneumonia, she finally went home and was doing relatively well.  Her next insult in August of 2013 vitamin inferior STEMI, and found to have an occluded native RCA, and occluded SVG-RCA. Initial PCI was preceded by extensive thrombectomy and placement of 3 stents. (Promus Element DES 2.5 x 38 mm as well as 2 additional 2.5 x 16 mm stents).  Within a couple days of this initial intervention, she recurrent inferior ST elevations and had reocclusion of the vein graft to the RCA.   More thrombectomy was performed along with PTCA of the previously placed stents. Following this event, she did relatively well up and was discharged with out for the complications. Despite all these inferior solution MI evidence, EF is still 45-50% on discharge.    I last saw her back in January of 2014, and she was doing relatively well just  having some mild edema and shortness of breath.   Interval History: She returns today, without really any significant complaints. She does note some dyspnea with exertion, but "not much." She notes that she feels winded with exertion but no chest discomfort pressure. He really says that her exertion is mostly limited by bilateral knee pain. She also is having significant upper lumbar back pains along the right side that bothering her a lot.   Her edema is also much improved me in 1-2+ bilaterally, she uses her compression stockings to help this out.. She denies any orthopnea, having slept on 2 pillows for years. No PND. She denies any lightheadedness, dizziness, wooziness, syncope/near-syncope. He did have one episode of feeling lightheaded and woozy when she had significant hypoglycemia about a month ago. No TIA or RCA symptoms. No melena, hematochezia or hematuria. Denies any claudication symptoms.   Past Medical History  Diagnosis Date  . Diabetes mellitus   . Hypertension   . PONV (postoperative nausea and vomiting)   . Angina   . Shortness of breath   . Chronic kidney disease   . Hypothyroidism   . Headache(784.0)   . Arthritis   . Anxiety   . Pneumonia   . Episodic lightheadedness, with near syncope X 3 07/30/2011  . CAD S/P percutaneous coronary angioplasty November 2012    Multivessel: 100% RCA open with PTCA in setting of inferior STEMI, also noted distal left main 80%  . S/P CABG x 3 08/05/2011  Presented to non-STEMI: LIMA - sequential prox-mid LAD, SVG-OM, SVG- RPDA  . Paroxysmal atrial fibrillation, post CABG 08/10/2011    Postop, no recurrence  . Anemia associated with acute blood loss, related to CABG 08/10/2011  . STEMI (ST elevation myocardial infarction), inf wall. 03/10/2012    Occluded SVG-RCA with known occluded native RCA  . Subsequent st elevation (stemi) myocardial infarction of inferior wall 03/11/2012    In total: 3 inferior STEMI's and one non-STEMI    Prior  Cardiac Evaluation and Past Surgical History: Past Surgical History  Procedure Laterality Date  . Cardiac catheterization  November 2012; August and September 2013    Patent LIMA-LAD, patent she had an OM, patent stented SVG-RCA; occluded native RCA, 90% ostial left main, tandem 80% proximal and mid LAD, 70% mid Circumflex  . Coronary angioplasty  November 2012    PTCA only of RCA and setting of inferior STEMI  . Coronary artery bypass graft  08/05/2011    Procedure: CORONARY ARTERY BYPASS GRAFTING (CABG);  Surgeon: [name], MD;  Location: [facility] OR;  Service: Open Heart Surgery;  Laterality: N/A;  coronary artery bypass graft times 4 using left internal mammary artery and right leg saphenous vein harvested endoscopically  . Joint replacement      both hips replaced  . I&d extremity  09/21/2011    Procedure: IRRIGATION AND DEBRIDEMENT EXTREMITY;  Surgeon: [name], MD;  Location: [facility] OR;  Service: Vascular;  Laterality: Right;  with wound vac placement  . Coronary angioplasty with stent placement  03/10/2012     infferior STEMI: Occluded native RCA and SVG-RCA --> thrombectomy and 3 stent placement to the SVG-RCA (2.5 mm right 38 mm and 2 proximal distal overlapping 2.5 mm x 16 mm Promus DES stents:  . Coronary angioplasty  03/11/2012    Inferior STEMI #3: Reoccluded SVG-RCA; extensive thrombectomy and post dilation PTCA  . Transthoracic echocardiogram  September 2013    EF 45-50%, mild hypokinesis of the basal and mid inferolateral wall, grade 1 diastolic dysfunction, mildly increased artery pressures.    Allergies  Allergen Reactions  . Tape     Use paper tape.  . Statins Rash    Current Outpatient Prescriptions  Medication Sig Dispense Refill  . acetaminophen (TYLENOL) 325 MG tablet Take 650 mg by mouth 2 (two) times daily.      Marland Kitchen allopurinol (ZYLOPRIM) 100 MG tablet Take 100 mg by mouth daily.       Marland Kitchen aspirin 81 MG tablet Take 81 mg by mouth daily.        . carvedilol  (COREG) 6.25 MG tablet Take 6.25 mg by mouth 2 (two) times daily with a meal.      . colesevelam (WELCHOL) 625 MG tablet Take 3 tablets (1,875 mg total) by mouth 2 (two) times daily with a meal.  180 tablet  5  . furosemide (LASIX) 20 MG tablet Take 40 mg by mouth 2 (two) times daily.       Marland Kitchen glimepiride (AMARYL) 2 MG tablet Take 2 mg by mouth daily with breakfast.      . isosorbide mononitrate (IMDUR) 30 MG 24 hr tablet Take 1 tablet (30 mg total) by mouth daily.  30 tablet  11  . levothyroxine (SYNTHROID, LEVOTHROID) 100 MCG tablet Take 100 mcg by mouth daily.       Marland Kitchen LORazepam (ATIVAN) 0.5 MG tablet 0.5 mg daily.       . pantoprazole (PROTONIX) 40 MG tablet Take 1  tablet (40 mg total) by mouth daily at 12 noon.  30 tablet  5  . pantoprazole (PROTONIX) 40 MG tablet       . ramipril (ALTACE) 5 MG capsule 5 mg daily.       . sodium bicarbonate 650 MG tablet Take 650 mg by mouth daily.        . Ticagrelor (BRILINTA) 90 MG TABS tablet Take 1 tablet (90 mg total) by mouth 2 (two) times daily.  60 tablet  11  . ezetimibe (ZETIA) 10 MG tablet Take 1 tablet (10 mg total) by mouth daily.  30 tablet  5   No current facility-administered medications for this visit.    History   Social History  . Marital Status: Married    Spouse Name: N/A    Number of Children: N/A  . Years of Education: N/A   Occupational History  . Not on file.   Social History Main Topics  . Smoking status: Never Smoker   . Smokeless tobacco: Never Used  . Alcohol Use: No  . Drug Use: No  . Sexually Active: No   Other Topics Concern  . 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 She is very active up and around the house, does not do routine exercise.   Does not smoke or drink.    ROS: A comprehensive Review of Systems - Negative except Pertinent positives noted above. Notably  exertional dyspnea, knee pain, back pain, and mild edema.  PHYSICAL EXAM BP 134/64  Pulse 64  Ht 5\' 1"  (1.549 m)  Wt 178 lb 6.4 oz (80.922 kg)  BMI 33.73 kg/m2 General appearance: alert, cooperative, appears stated age, no distress, mildly obese and Very pleasant, well-nourished and well-groomed. Despite her age, she answers questions appropriately, and is quickwitted. Neck: no adenopathy, no carotid bruit, no JVD and supple, symmetrical, trachea midline Lungs: clear to auscultation bilaterally, normal percussion bilaterally and Nonlabored, good air movement Heart: regular rate and rhythm, S1, S2 normal, no S3 or S4, systolic murmur: systolic ejection 2/6, crescendo, decrescendo and harsh at 2nd right intercostal space, radiates to carotids, no click and no rub Abdomen: soft, non-tender; bowel sounds normal; no masses,  no organomegaly Extremities: edema 1-2+ bilateral pitting edema. Stable. No significant venous stasis changes noted. and no ulcers, gangrene or trophic changes Pulses: 2+ and symmetric Neurologic: Grossly normal; A&O x3  HEENT: Caroline/AT, EOMI, MMM, anicteric sclera with mild arcus senilis   [id number] today: Yes Rate:64  , Rhythm:  Normal sinus rhythm ;  nonspecific ST-T changes. Otherwise normal.   Recent Labs: None available   ASSESSMENT / PLAN:  Inf STEMI #1 -PTCA to RCA (05/2011) + LM 80;NSTEMI (07/2011) - CABG x3;  inf STEMI #2 -- SVG-RCA  PCI with DES X 3 on 03/10/12; with re-occlusion 24hrs later (inferior STEMI #3) - redo thrombectomy/PTCA She sees he'll now relatively stable with the redo PTCA OM vein graft to O. recurrent anginal symptoms appear she continues to be on dual therapy with aspirin and Brilinta with no bleeding issues. She is on an ACE inhibitor and beta blocker as well as nitrates. She does not take statins due to intolerance.  Plan: Due to the extensive nature of the SVG PCI, I would not stop dual antiplatelet therapy. We could switch to Brilinta to  Plavix see her back which would be over a  year after the PCI. I don't think that we would stop DAPT unless there is reason such as bleeding or procedures.  HTN (hypertension)  Blood pressure is well-controlled today on ACE inhibitor and beta blocker. No changes.  Ischemic cardiomyopathy, EF 45-50% 2D 03/13/12 No real heart failure symptoms, just mild edema cannot in her long-standing problem, and may not be cardiac in nature since she does not have hepatomegaly with ascites to suggest right heart failure or PND and orthopnea to suggest left heart failure.  Plan: Continue twice a day Lasix and compression stockings. Elevate feet when possible.   Obesity (BMI 30-39.9) She continues to be more active with doing exercises, it is really limited due to arthritis. We did spend some time, diet modification. Pre-and her and her family, to try to reduce the carbohydrate intake, increase fruits vegetables and lean proteins.  Dyslipidemia with statin intolerance She seemed to be tolerating Zetia with no untoward side effects. Her primary physician is monitoring her lipid profile.  PAF post CABG. Holding NSR off Amiodarone She certainly did not do well while in atrial fibrillation. Thankfully now been off the amiodarone for quite some time she's not had any recurrent that we can tell.   Stable, no changes need to be made. Followup: 6 months  DAVID W. Herbie Baltimore, M.D., M.S. [facility] [address] [place], [place]  [address]  [phone] Pager # [phone]

## 2013-02-15 NOTE — Assessment & Plan Note (Signed)
She seemed to be tolerating Zetia with no untoward side effects. Her primary physician is monitoring her lipid profile.

## 2013-02-15 NOTE — Assessment & Plan Note (Signed)
She sees he'll now relatively stable with the redo PTCA OM vein graft to O. recurrent anginal symptoms appear she continues to be on dual therapy with aspirin and Brilinta with no bleeding issues. She is on an ACE inhibitor and beta blocker as well as nitrates. She does not take statins due to intolerance.  Plan: Due to the extensive nature of the SVG PCI, I would not stop dual antiplatelet therapy. We could switch to Brilinta to Plavix see her back which would be over a year after the PCI. I don't think that we would stop DAPT unless there is reason such as bleeding or procedures.

## 2013-02-15 NOTE — Assessment & Plan Note (Signed)
No real heart failure symptoms, just mild edema cannot in her long-standing problem, and may not be cardiac in nature since she does not have hepatomegaly with ascites to suggest right heart failure or PND and orthopnea to suggest left heart failure.  Plan: Continue twice a day Lasix and compression stockings. Elevate feet when possible.

## 2013-02-15 NOTE — Assessment & Plan Note (Signed)
She certainly did not do well while in atrial fibrillation. Thankfully now been off the amiodarone for quite some time she's not had any recurrent that we can tell.

## 2013-02-15 NOTE — Assessment & Plan Note (Signed)
She continues to be more active with doing exercises, it is really limited due to arthritis. We did spend some time, diet modification. Pre-and her and her family, to try to reduce the carbohydrate intake, increase fruits vegetables and lean proteins.

## 2013-02-15 NOTE — Assessment & Plan Note (Addendum)
Blood pressure is well-controlled today on ACE inhibitor and beta blocker. No changes.

## 2013-03-04 ENCOUNTER — Other Ambulatory Visit: Payer: Self-pay | Admitting: *Deleted

## 2013-03-04 MED ORDER — TICAGRELOR 90 MG PO TABS: 90.0000 mg | ORAL_TABLET | Freq: Two times a day (BID) | ORAL | Status: DC

## 2013-04-01 ENCOUNTER — Encounter: Payer: Self-pay | Admitting: *Deleted

## 2013-04-01 DIAGNOSIS — B351 Tinea unguium: Secondary | ICD-10-CM | POA: Insufficient documentation

## 2013-04-11 ENCOUNTER — Ambulatory Visit: Payer: Self-pay | Admitting: Podiatry

## 2013-04-18 ENCOUNTER — Other Ambulatory Visit: Payer: Self-pay | Admitting: Endocrinology

## 2013-04-18 DIAGNOSIS — N19 Unspecified kidney failure: Secondary | ICD-10-CM

## 2013-04-22 ENCOUNTER — Ambulatory Visit
Admission: RE | Admit: 2013-04-22 | Discharge: 2013-04-22 | Disposition: A | Discharge status: Home / Self Care | Payer: Medicare Other | Source: Ambulatory Visit | Attending: Endocrinology | Admitting: Endocrinology

## 2013-04-22 DIAGNOSIS — N19 Unspecified kidney failure: Secondary | ICD-10-CM

## 2013-05-21 ENCOUNTER — Encounter: Payer: Self-pay | Admitting: Cardiology

## 2013-05-23 ENCOUNTER — Ambulatory Visit (INDEPENDENT_AMBULATORY_CARE_PROVIDER_SITE_OTHER): Payer: Medicare Other | Admitting: Podiatry

## 2013-05-23 ENCOUNTER — Encounter: Payer: Self-pay | Admitting: Podiatry

## 2013-05-23 VITALS — BP 127/70 | HR 76 | Resp 18 | Ht 62.0 in | Wt 167.0 lb

## 2013-05-23 DIAGNOSIS — M79609 Pain in unspecified limb: Secondary | ICD-10-CM

## 2013-05-23 DIAGNOSIS — B351 Tinea unguium: Secondary | ICD-10-CM

## 2013-05-26 NOTE — Progress Notes (Signed)
Subjective:     Patient ID: [name], female   DOB: 02-19-1921, 79 y.o.   MRN: [id number]  HPI patient states I need my nails cut. States they're tender thick and she cannot take care of them herself   Review of Systems     Objective:   Physical Exam Neurovascular status same as previous. Nail disease with thickness 1-5 both feet and pain    Assessment:     Mycotic nail infection with pain 1-5 both feet    Plan:     Debridement of painful nail bed 1-5 both feet

## 2013-07-08 ENCOUNTER — Other Ambulatory Visit: Payer: Self-pay

## 2013-07-08 DIAGNOSIS — Z1231 Encounter for screening mammogram for malignant neoplasm of breast: Secondary | ICD-10-CM

## 2013-08-01 ENCOUNTER — Ambulatory Visit (INDEPENDENT_AMBULATORY_CARE_PROVIDER_SITE_OTHER): Payer: Medicare Other | Admitting: Cardiology

## 2013-08-01 VITALS — BP 152/58 | HR 62 | Ht 61.0 in | Wt 167.2 lb

## 2013-08-01 DIAGNOSIS — D649 Anemia, unspecified: Secondary | ICD-10-CM

## 2013-08-01 DIAGNOSIS — I255 Ischemic cardiomyopathy: Secondary | ICD-10-CM

## 2013-08-01 DIAGNOSIS — I10 Essential (primary) hypertension: Secondary | ICD-10-CM

## 2013-08-01 DIAGNOSIS — Z951 Presence of aortocoronary bypass graft: Secondary | ICD-10-CM

## 2013-08-01 DIAGNOSIS — R7989 Other specified abnormal findings of blood chemistry: Secondary | ICD-10-CM

## 2013-08-01 DIAGNOSIS — I2510 Atherosclerotic heart disease of native coronary artery without angina pectoris: Secondary | ICD-10-CM

## 2013-08-01 DIAGNOSIS — I480 Paroxysmal atrial fibrillation: Secondary | ICD-10-CM

## 2013-08-01 DIAGNOSIS — I219 Acute myocardial infarction, unspecified: Secondary | ICD-10-CM

## 2013-08-01 DIAGNOSIS — I4891 Unspecified atrial fibrillation: Secondary | ICD-10-CM

## 2013-08-01 DIAGNOSIS — I213 ST elevation (STEMI) myocardial infarction of unspecified site: Secondary | ICD-10-CM

## 2013-08-01 DIAGNOSIS — R195 Other fecal abnormalities: Secondary | ICD-10-CM

## 2013-08-01 DIAGNOSIS — I2589 Other forms of chronic ischemic heart disease: Secondary | ICD-10-CM

## 2013-08-01 DIAGNOSIS — E785 Hyperlipidemia, unspecified: Secondary | ICD-10-CM

## 2013-08-01 LAB — CBC
HCT: 33.8 % — ABNORMAL LOW (ref 36.0–46.0)
Hemoglobin: 10.5 g/dL — ABNORMAL LOW (ref 12.0–15.0)
MCH: 23.8 pg — ABNORMAL LOW (ref 26.0–34.0)
MCHC: 31.1 g/dL (ref 30.0–36.0)
MCV: 76.6 fL — ABNORMAL LOW (ref 78.0–100.0)
PLATELETS: 232 10*3/uL (ref 150–400)
RBC: 4.41 MIL/uL (ref 3.87–5.11)
RDW: 19.5 % — AB (ref 11.5–15.5)
WBC: 5.2 10*3/uL (ref 4.0–10.5)

## 2013-08-01 NOTE — Patient Instructions (Addendum)
STOP ASPIRIN  LABs-- CBC  Your physician wants you to follow-up in 6 MONTH Dr Herbie Baltimore.  You will receive a reminder letter in the mail two months in advance. If you don't receive a letter, please call our office to schedule the follow-up appointment.

## 2013-08-03 ENCOUNTER — Encounter: Payer: Self-pay | Admitting: Cardiology

## 2013-08-03 DIAGNOSIS — I25709 Atherosclerosis of coronary artery bypass graft(s), unspecified, with unspecified angina pectoris: Secondary | ICD-10-CM | POA: Insufficient documentation

## 2013-08-03 DIAGNOSIS — R195 Other fecal abnormalities: Secondary | ICD-10-CM | POA: Insufficient documentation

## 2013-08-03 NOTE — Progress Notes (Signed)
Patient ID: [name], female   DOB: Aug 06, 1920, 80 y.o.   MRN: [id number] PCP: Michiel Sites, MD  Clinic Note: Chief Complaint  Patient presents with  . Follow-up    6 months follow up, little SOB, tired very easily, swelling in knee, stated that her stool have been very tarry lately.   HPI: [name] is a 80 y.o. female with a PMH below who presents today for followup of her complex coronary disease history. (Please see my note from Aug 2014 for a detailed history) CAD History:  Inferior STEMI November 2012: Occluded RCA with severe Left Main disease; rescue PTCA; thrombectomy --> turned down for CABG, medical therapy only  Non-STEMI January 2013: Referred for CABG (LIMA-LAD, SVG- OM, SVG- PDA) with prolonged postop course, did by postop A. fib and pneumonia; outpatient complications included cellulitis at vein graft harvest site. Likely secondary to wound dehiscence secondary to edema  Inferior STEMI August 2013: Occluded native RCA with occluded SVG-RCA --> extensive thrombectomy with 3 Promus element DES stents placed (Promus Element DES 2.5 x 38 mm as well as 2 additional 2.5 x 16 mm stents)  Acute stent thrombosis a couple days later With recurrent inferior STEMI: Thrombectomy and PTCA of stents.  EF 40-45% on discharge.  Her last visit was in August of 1014, she is doing relatively well. She was having some issues with edema so I continued her on twice a day Lasix and compression stockings, with foot elevation. We discontinued her amiodarone. We decided that we would not stop dual antiplatelet therapy. She remains on aspirin plus Brilinta.  Interval History: She returns today, without really any significant complaints. She does note some dyspnea with exertion, but "not much."   She notes that she feels winded with exertion but no chest discomfort pressure. He really says that her exertion is mostly limited by bilateral knee pain. She also notes some occasional  dizziness and palpitations. She also is having significant upper lumbar back pains along the right side as well as bilateral knee pain that bothering her a lot. Is able to do her chores around the house, but not much more than that. Her edema is notably improved. She can balance is having forgotten to take her Lasix today. She's been taking 40 mg a day most days, and not twice a day. She still uses her compression stockings just not today. She noted over last few days that her bowel movements have been quite dark and tarry. She's not noted any weakness or numbness. No abdominal pain just some mild fullness.  She denies any orthopnea, having slept on 2 pillows for years. No PND. She denies any syncope/near-syncope, but occasionally feels a little a head. No TIA or RCA symptoms. Tarry stools, but no hematochezia or hematuria. Denies any claudication symptoms.   Past Medical History  Diagnosis Date  . Diabetes mellitus   . Hypertension   . Angina   . Chronic kidney disease   . Hypothyroidism   . Headache(784.0)   . Arthritis   . Episodic lightheadedness, with near syncope X 3 07/30/2011    CARDIAC DISEASE HISTORY    . CAD S/P percutaneous coronary angioplasty November 2012    Multivessel: 100% RCA open with PTCA in setting of inferior STEMI, also noted distal left main 80%  . S/P CABG x 3 08/05/2011    Presented to non-STEMI: LIMA - sequential prox-mid LAD, SVG-OM, SVG- RPDA  . Paroxysmal atrial fibrillation, post CABG 08/10/2011  Postop, no recurrence  . Anemia associated with acute blood loss, related to CABG 08/10/2011  . STEMI (ST elevation myocardial infarction), inf wall. 03/10/2012    Occluded SVG-RCA with known occluded native RCA  . Subsequent st elevation (stemi) myocardial infarction of inferior wall 03/11/2012    In total: 3 inferior STEMI's and one non-STEMI   Prior Cardiac Evaluation and Past Surgical History: Past Surgical History  Procedure Laterality Date  . Cardiac  catheterization  November 2012; August and September 2013    Patent LIMA-LAD, patent she had an OM, patent stented SVG-RCA; occluded native RCA, 90% ostial left main, tandem 80% proximal and mid LAD, 70% mid Circumflex  . Coronary angioplasty  November 2012    PTCA only of RCA and setting of inferior STEMI  . Coronary artery bypass graft  08/05/2011    Procedure: CORONARY ARTERY BYPASS GRAFTING (CABG);  Surgeon: Delight OvensEdward B Gerhardt, MD;  [facility];  Service: Open Heart Surgery;  Laterality: N/A;  coronary artery bypass graft times 4 using left internal mammary artery and right leg saphenous vein harvested endoscopically  . Joint replacement      both hips replaced  . I&d extremity  09/21/2011    Procedure: IRRIGATION AND DEBRIDEMENT EXTREMITY;  Surgeon: Delight OvensEdward B Gerhardt, MD;  [facility];  Service: Vascular;  Laterality: Right;  with wound vac placement  . Coronary angioplasty with stent placement  03/10/2012     infferior STEMI: Occluded native RCA and SVG-RCA --> thrombectomy and 3 stent placement to the SVG-RCA (2.5 mm right 38 mm and 2 proximal distal overlapping 2.5 mm x 16 mm Promus DES stents:  . Coronary angioplasty  03/11/2012    Inferior STEMI #3: Reoccluded SVG-RCA; extensive thrombectomy and post dilation PTCA  . Transthoracic echocardiogram  September 2013    EF 45-50%, mild hypokinesis of the basal and mid inferolateral wall, grade 1 diastolic dysfunction, mildly increased artery pressures.   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 She is very active up and around the house, does not do routine exercise.   Does not smoke or drink.  Family History: Remains noncontributory in this elderly woman.    ROS: A comprehensive Review of Systems - Negative except Pertinent positives noted above. Notably  exertional dyspnea, knee pain, back pain, and mild edema.  PHYSICAL EXAM BP 152/58  Pulse 62  Ht 5\' 1"  (1.549 m)  Wt 167 lb 3.2 oz (75.841 kg)  BMI 31.61 kg/m2 General appearance: A&O x3 , cooperative, appears stated age, no distress, mildly obese and Very pleasant, well-nourished and well-groomed. Despite her age, she answers questions appropriately, and is quickwitted. Neck: no adenopathy, no carotid bruit, no JVD and supple, symmetrical, trachea midline HEENT: [place]/AT, EOMI, MMM, anicteric sclera with mild arcus senilis  Lungs: clear to auscultation bilaterally, normal percussion bilaterally and Nonlabored, good air movement Heart: RRR, S1, S2 normal, no S3 or S4, systolic murmur: systolic ejection 2/6, crescendo, decrescendo and harsh at 2nd right intercostal space, radiates to carotids, no click and no rub Abdomen: soft, non-tender; bowel sounds normal; no masses,  no organomegaly Extremities: edema 1-2+ bilateral pitting edema. Stable. No significant venous stasis changes noted. and no ulcers, gangrene or trophic changes; Pulses: 2+ and symmetric Neurologic: Grossly normal;   JXB:[id number]:Performed today: Yes Rate:62 , Rhythm:  Normal sinus rhythm ;  nonspecific ST-T changes. Otherwise normal.   Recent Labs: None available   ASSESSMENT / PLAN:  STEMI, 03/10/12 Rx'd with PCI with recurrance 03/11/12 She is at high-risk for reocclusion of her stents. Unfortunately of a concern with the dark tarry stool the she's been having. I want a CBC to ensure that she is not stable, and will stop her aspirin now as long as she is on Brilinta. Depending on the findings situations Gerri Spore back in 6 months, we can consider switching to aspirin plus Plavix provided there is no sign of GI bleeds. I would like him at least another 6 months of the more potent antiplatelet agent  S/P CABG x 3, LIMA - LAD, SVG-OM, SVG- PDA 08/05/11 This was for left main disease. The graft to the left system are patent, however the graft  the right has had extensive intervention.  Ischemic cardiomyopathy, EF 45-50% 2D 03/13/12 No real heart failure symptoms, simply edema that has been long-standing and may be not related. I think that's fine with her taking Lasix once a day wearing compression stockings. She is daily and elevate her feet when possible. If edema worsens or she does have symptoms of PND and/or orthopnea. I would increase her Lasix to twice a day again.  PAF post CABG. Holding NSR off Amiodarone As far as I can tell, she's not had any further recurrences of A. fib. Simply based on how poorly she did well she was in A. fib I don't think she's had any notable episodes. It no longer on amiodarone. Not on full-time anticoagulation beyond Brilinta.  HTN (hypertension) Blood pressures will be elevated today, but she has not had her Lasix. We check it at home it is usually relatively stable. I did tolerate one reading that is elevated. She is due to see her PCP in about 3 months, we can determine what pressures at that time. With occasional dizziness, I'm reluctant to be too aggressive.  Dark stools I'm concerned with her being on dual antiplatelet therapy, that she could be having a GI bleed. It doesn't sound like melanotic, but this is a new finding for her.  Plan: Hold aspirin for now, check a CBC to ensure that she is not anemic  Dyslipidemia with statin intolerance On Zetia, and tolerating it well. Being followed by her PCP. I don't have any labs since 2013, moderate lipids were much better in 2013 as opposed to 2012. She is statin intolerant  CAD (coronary artery disease) of bypass graft, along with subtotal left main occlusion and and RCA occlusion - graft dependent Intermittent symptoms of chest discomfort, she has when necessary nitroglycerin is also on Imdur. She has not noted any new symptoms recently.   Stable, no changes need to be made. Pending CBC results.  Followup: 6 months  Stonewall Doss W. Herbie Baltimore, M.D.,  M.S. [facility] [address] [place], [place]  [address]  [phone] Pager # [phone]

## 2013-08-03 NOTE — Assessment & Plan Note (Signed)
Blood pressures will be elevated today, but she has not had her Lasix. We check it at home it is usually relatively stable. I did tolerate one reading that is elevated. She is due to see her PCP in about 3 months, we can determine what pressures at that time. With occasional dizziness, I'm reluctant to be too aggressive.

## 2013-08-03 NOTE — Assessment & Plan Note (Signed)
No real heart failure symptoms, simply edema that has been long-standing and may be not related. I think that's fine with her taking Lasix once a day wearing compression stockings. She is daily and elevate her feet when possible. If edema worsens or she does have symptoms of PND and/or orthopnea. I would increase her Lasix to twice a day again.

## 2013-08-03 NOTE — Assessment & Plan Note (Signed)
She is at high-risk for reocclusion of her stents. Unfortunately of a concern with the dark tarry stool the she's been having. I want a CBC to ensure that she is not stable, and will stop her aspirin now as long as she is on Brilinta. Depending on the findings situations Gerri Spore back in 6 months, we can consider switching to aspirin plus Plavix provided there is no sign of GI bleeds. I would like him at least another 6 months of the more potent antiplatelet agent

## 2013-08-03 NOTE — Assessment & Plan Note (Signed)
This was for left main disease. The graft to the left system are patent, however the graft the right has had extensive intervention.

## 2013-08-03 NOTE — Assessment & Plan Note (Signed)
I'm concerned with her being on dual antiplatelet therapy, that she could be having a GI bleed. It doesn't sound like melanotic, but this is a new finding for her.  Plan: Hold aspirin for now, check a CBC to ensure that she is not anemic

## 2013-08-03 NOTE — Assessment & Plan Note (Signed)
As far as I can tell, she's not had any further recurrences of A. fib. Simply based on how poorly she did well she was in A. fib I don't think she's had any notable episodes. It no longer on amiodarone. Not on full-time anticoagulation beyond Brilinta.

## 2013-08-03 NOTE — Assessment & Plan Note (Signed)
On Zetia, and tolerating it well. Being followed by her PCP. I don't have any labs since 2013, moderate lipids were much better in 2013 as opposed to 2012. She is statin intolerant

## 2013-08-03 NOTE — Assessment & Plan Note (Signed)
Intermittent symptoms of chest discomfort, she has when necessary nitroglycerin is also on Imdur. She has not noted any new symptoms recently.

## 2013-08-05 ENCOUNTER — Ambulatory Visit: Payer: Medicare Other

## 2013-08-05 ENCOUNTER — Telehealth: Payer: Self-pay | Admitting: *Deleted

## 2013-08-05 NOTE — Telephone Encounter (Signed)
Spoke to patient.CBC  Result given . Verbalized understanding  PER Dr Herbie Baltimore - CONTINUE WITH CURRENT MEDICATION EXCEPT  ASPIRIN STOP AS ORDERED AT LAST OFFICE APPOINTMENT

## 2013-08-05 NOTE — Telephone Encounter (Signed)
Message copied by Tobin Chad on Mon Aug 05, 2013 11:29 AM ------      Message from: Marykay Lex      Created: Fri Aug 02, 2013  6:27 PM       CBC looks Great!!  - no concern over dark stool.            Marykay Lex, MD       ------

## 2013-08-22 ENCOUNTER — Encounter: Payer: Self-pay | Admitting: Podiatry

## 2013-08-22 ENCOUNTER — Ambulatory Visit (INDEPENDENT_AMBULATORY_CARE_PROVIDER_SITE_OTHER): Payer: Medicare Other | Admitting: Podiatry

## 2013-08-22 VITALS — BP 151/68 | HR 67 | Resp 16

## 2013-08-22 DIAGNOSIS — M79609 Pain in unspecified limb: Secondary | ICD-10-CM

## 2013-08-22 DIAGNOSIS — B351 Tinea unguium: Secondary | ICD-10-CM

## 2013-08-22 NOTE — Patient Instructions (Signed)
Diabetes and Foot Care Diabetes may cause you to have problems because of poor blood supply (circulation) to your feet and legs. This may cause the skin on your feet to become thinner, break easier, and heal more slowly. Your skin may become dry, and the skin may peel and crack. You may also have nerve damage in your legs and feet causing decreased feeling in them. You may not notice minor injuries to your feet that could lead to infections or more serious problems. Taking care of your feet is one of the most important things you can do for yourself.  HOME CARE INSTRUCTIONS  Wear shoes at all times, even in the house. Do not go barefoot. Bare feet are easily injured.  Check your feet daily for blisters, cuts, and redness. If you cannot see the bottom of your feet, use a mirror or ask someone for help.  Wash your feet with warm water (do not use hot water) and mild soap. Then pat your feet and the areas between your toes until they are completely dry. Do not soak your feet as this can dry your skin.  Apply a moisturizing lotion or petroleum jelly (that does not contain alcohol and is unscented) to the skin on your feet and to dry, brittle toenails. Do not apply lotion between your toes.  Trim your toenails straight across. Do not dig under them or around the cuticle. File the edges of your nails with an emery board or nail file.  Do not cut corns or calluses or try to remove them with medicine.  Wear clean socks or stockings every day. Make sure they are not too tight. Do not wear knee-high stockings since they may decrease blood flow to your legs.  Wear shoes that fit properly and have enough cushioning. To break in new shoes, wear them for just a few hours a day. This prevents you from injuring your feet. Always look in your shoes before you put them on to be sure there are no objects inside.  Do not cross your legs. This may decrease the blood flow to your feet.  If you find a minor scrape,  cut, or break in the skin on your feet, keep it and the skin around it clean and dry. These areas may be cleansed with mild soap and water. Do not cleanse the area with peroxide, alcohol, or iodine.  When you remove an adhesive bandage, be sure not to damage the skin around it.  If you have a wound, look at it several times a day to make sure it is healing.  Do not use heating pads or hot water bottles. They may burn your skin. If you have lost feeling in your feet or legs, you may not know it is happening until it is too late.  Make sure your health care provider performs a complete foot exam at least annually or more often if you have foot problems. Report any cuts, sores, or bruises to your health care provider immediately. SEEK MEDICAL CARE IF:   You have an injury that is not healing.  You have cuts or breaks in the skin.  You have an ingrown nail.  You notice redness on your legs or feet.  You feel burning or tingling in your legs or feet.  You have pain or cramps in your legs and feet.  Your legs or feet are numb.  Your feet always feel cold. SEEK IMMEDIATE MEDICAL CARE IF:   There is increasing redness,   swelling, or pain in or around a wound.  There is a red line that goes up your leg.  Pus is coming from a wound.  You develop a fever or as directed by your health care provider.  You notice a bad smell coming from an ulcer or wound. Document Released: 06/24/2000 Document Revised: 02/27/2013 Document Reviewed: 12/04/2012 [employer] Patient Information 2014 [employer].  

## 2013-08-23 NOTE — Progress Notes (Signed)
Subjective:     Patient ID: [name], female   DOB: 1921-07-01, 80 y.o.   MRN: [id number]  HPI patient is found to have nail disease with thickness 1-5 both feet that she cannot cut   Review of Systems     Objective:   Physical Exam Neurovascular status unchanged well oriented x3 with nail disease and pain 1-5 of both feet    Assessment:     Mycotic nail infection with pain 1-5 both feet    Plan:     Debridement of painful nail bed 1-5 both feet with no iatrogenic bleeding noted

## 2013-11-18 ENCOUNTER — Ambulatory Visit: Payer: Medicare Other | Admitting: Podiatry

## 2013-11-21 ENCOUNTER — Ambulatory Visit: Payer: Medicare Other | Admitting: Podiatry

## 2013-12-05 ENCOUNTER — Ambulatory Visit (INDEPENDENT_AMBULATORY_CARE_PROVIDER_SITE_OTHER): Payer: Medicare Other | Admitting: Podiatry

## 2013-12-05 DIAGNOSIS — M79609 Pain in unspecified limb: Secondary | ICD-10-CM

## 2013-12-05 DIAGNOSIS — B351 Tinea unguium: Secondary | ICD-10-CM

## 2013-12-05 NOTE — Progress Notes (Signed)
   Subjective:    Patient ID: [name], female    DOB: 24-Aug-1920, 80 y.o.   MRN: [id number]  HPI Needs nails trimmed   Review of Systems     Objective:   Physical Exam        Assessment & Plan:

## 2013-12-05 NOTE — Progress Notes (Signed)
Subjective:     Patient ID: [name], female   DOB: 11-Mar-1921, 80 y.o.   MRN: [id number]  HPI patient has thick nail bed 1-5 both feet that she tries to cut herself but is unable and air painful   Review of Systems     Objective:   Physical Exam Neurovascular status intact with thick incurvated nail bed 1-5 both feet that are painful    Assessment:     Mycotic nail infection that are painful 1-5 both feet    Plan:     Debridement nailbeds 1-5 both feet with no iatrogenic bleeding noted

## 2014-02-03 ENCOUNTER — Ambulatory Visit (INDEPENDENT_AMBULATORY_CARE_PROVIDER_SITE_OTHER): Payer: Medicare Other | Admitting: Cardiology

## 2014-02-03 VITALS — BP 122/70 | HR 58 | Ht 61.0 in | Wt 165.7 lb

## 2014-02-03 DIAGNOSIS — I2589 Other forms of chronic ischemic heart disease: Secondary | ICD-10-CM

## 2014-02-03 DIAGNOSIS — E669 Obesity, unspecified: Secondary | ICD-10-CM

## 2014-02-03 DIAGNOSIS — I255 Ischemic cardiomyopathy: Secondary | ICD-10-CM

## 2014-02-03 DIAGNOSIS — E1129 Type 2 diabetes mellitus with other diabetic kidney complication: Secondary | ICD-10-CM

## 2014-02-03 DIAGNOSIS — I25810 Atherosclerosis of coronary artery bypass graft(s) without angina pectoris: Secondary | ICD-10-CM

## 2014-02-03 DIAGNOSIS — E785 Hyperlipidemia, unspecified: Secondary | ICD-10-CM

## 2014-02-03 DIAGNOSIS — I10 Essential (primary) hypertension: Secondary | ICD-10-CM

## 2014-02-03 DIAGNOSIS — I2510 Atherosclerotic heart disease of native coronary artery without angina pectoris: Secondary | ICD-10-CM

## 2014-02-03 NOTE — Patient Instructions (Signed)
No changes with current medication.  Your physician wants you to follow-up in 6 month Dr Herbie Baltimore.  You will receive a reminder letter in the mail two months in advance. If you don't receive a letter, please call our office to schedule the follow-up appointment.

## 2014-02-07 ENCOUNTER — Encounter: Payer: Self-pay | Admitting: Cardiology

## 2014-02-07 NOTE — Assessment & Plan Note (Signed)
Appear to be relatively stable. She is not currently on any medications.

## 2014-02-07 NOTE — Progress Notes (Signed)
[name]: [name] MRN: [id number]  DOB: [birth year]   DOV:02/07/2014 PCP: Michiel SitesKOHUT,WALTER DENNIS, MD  Clinic Note: Chief Complaint  Patient presents with  . Follow-up    6 months follow-up   HPI: [name] Ardath SaxM [name] is a 80 y.o.  female with a PMH below who presents today for 6 month follow-up for complex CAD history noted below.    Interval History: She comes in today doing "pretty well"without any major complaints. She is able to get around the house with a cane without much problem. She does occasionally get exertional dyspnea doing house chores but no chest tightness or pressure to suggest angina. She denies any PND, orthopnea to go with her stable edema. Back she thinks her swelling is going down. She denies any rapid or irregular heart beats/palpitations or arrhythmias. No recurrence of her syncopal or near syncopal type symptoms. No TIA or amaurosis fugax symptoms. Notably no recurrence of dark tarry stools. No claudication  Past Medical History  Diagnosis Date  . ST elevation myocardial infarction (STEMI) of inferior wall 05/2011; 03/10/2012    a) 11/'12: 100% RCA, dLM 80% -- POBA of RCA (for planned CABG, not done until re-admission with NSTEMI 1/'13);; b) 8/'13: 100% SVG-RCA (PCI & re-PCI), native RCA 100%; Patent LIMA-p-mLAD, SVG-OM  . CAD S/P percutaneous coronary angioplasty 11/'12; 8/31 & 9/1/'13    a) MV: 100% RCA-POBA, dLM left main 80%; b) 1/'13: NSTEMI --> CABG; c) 2013: 8/31 - 100% RCA & acute SVG-RCA, 100% SVG-OM, 90% LM, 80% p&mLAD, ~70% RI, 50% Cx --> PCI-SVG-RCA: Promus Element DES x 3 (prox 2.5 mm x 38 mm & 2.5 mm x 16 mm, distal 2.5 mm x 16 mm); on 9/1 - accute in-stent Thrombosis - Aspiration thrombectomy & PTCA  . S/P CABG x 4 08/05/11    Dr. Tyrone SageGerhardt: LIMA-p-mLAD, SVG-RPDA, SVG-OM; known  100% SVG-OM, Extensive PTCA of SVG-RCA;;  Hospital course complicated by Afib & PNA; after d/c cellulitis of SVG harvest site due to edema  . Episodic atrial fibrillation 07/2011      Post-Op CABG  . Ischemic cardiomyopathy 03/2012    Echo: EF 45-50% - Mild basal- mid inferolateral Hypokinesis: Gr 1 DD, mildly increased PAP.  Marland Kitchen. Dyslipidemia, goal LDL below 70     statin intolerance (lipitor, crestor drug reaction); Welchol & Zetia  . Hypertension, essential   . DM (diabetes mellitus), type 2 with renal complications   . Chronic kidney disease (CKD) stage G3b/A1, moderately decreased glomerular filtration rate (GFR) between 30-44 mL/min/1.73 square meter and albuminuria creatinine ratio less than 30 mg/g   . History of pneumonia     post-op from CABG  . Hypothyroidism     on synthroid  . Headache(784.0)   . Osteoarthritis   . Anxiety     PRN Xanax   . Dermatophytosis of nail   . Gout     on Allopurinol   Prior Cardiac Evaluation and Past Surgical History: Reviewed in Epic - Pertinent details in PMH Allergies & Medications Reviewed in Epic - no changes from last visit. No Change to Social & Family History - reviewed in Epic  ROS: A comprehensive Review of Systems was performed: Review of Systems  Constitutional: Negative for weight loss and malaise/fatigue.  HENT:       Chronic HA  Respiratory: Negative.   Cardiovascular: Positive for leg swelling.       See HPI  Gastrointestinal: Positive for constipation. Negative for blood in stool and melena.  Relatively well controlled constipation  Musculoskeletal: Positive for back pain and joint pain. Negative for falls and myalgias.       Knees & hips - limit her walking; uses cane or walker  Neurological: Positive for headaches. Negative for dizziness, tremors, sensory change, speech change, focal weakness, seizures, loss of consciousness and weakness.       She does have mild balance issues which she attributes more to her being off centered since her orthopedic/hip surgeries.  Endo/Heme/Allergies: Negative for polydipsia. Does not bruise/bleed easily.  Psychiatric/Behavioral: Negative.   All other systems  reviewed and are negative.  Wt Readings from Last 3 Encounters:  02/03/14 165 lb 11.2 oz (75.161 kg)  08/01/13 167 lb 3.2 oz (75.841 kg)  05/23/13 167 lb (75.751 kg)   PHYSICAL EXAM BP 122/70  Pulse 58  Ht 5\' 1"  (1.549 m)  Wt 165 lb 11.2 oz (75.161 kg)  BMI 31.32 kg/m2 General appearance: A&O x3 , cooperative, appears stated age, no distress, mildly obese and Very pleasant, well-nourished and well-groomed. Despite her age, she answers questions appropriately, and is quickwitted.  Neck: supple; No LAD, carotid bruit, or JVD  HEENT: [place]/AT, EOMI, MMM, anicteric sclera with mild arcus senilis  Lungs: CTAB, normal percussion bilaterally and Nonlabored, good air movement  Heart: RRR, S1, S2 normal, no S3 or S4; Harsh 2/6 c-d SEM @ RUSB--> carotids; no click or rub  Abdomen: soft, non-tender; bowel sounds normal; no masses, no organomegaly  Extremities: edema 1-2+ bilateral pitting edema. Stable. No significant venous stasis changes noted. and no ulcers -- SVG harvest scars - well healed; Pulses: 2+ and symmetric Neurologic: Grossly normal;    Adult ECG Report  Rate: 58 ;  Rhythm: sinus bradycardia  Narrative Interpretation: stable EKG  Recent Labs: None recently (CBC from Feb with Hgb 10.5)  ASSESSMENT / PLAN: Inf STEMI #1 -PTCA to RCA (05/2011) + LM 80;NSTEMI (07/2011) - CABG x3;  inf STEMI #2 -- SVG-RCA  PCI with DES X 3 on 03/10/12; with re-occlusion 24hrs later (inferior STEMI #3) - redo thrombectomy/PTCA She is now almost 2 years out from her most recent MI, and has a relatively preserved EF with only mild exertional dyspnea. No recurrent angina She and her daughter did indicate that she probably would not want to have any additional invasive procedures performed. She does not think she will tolerate any more. As per previous note, will continue DAPT DUE to the extensive amount of clot from her last MI.  If she has easy bruising or bleeding, would probably stop aspirin as opposed to  Brilinta. In that case, we could also set her switching to Plavix.Marland Kitchen  CAD (coronary artery disease) of bypass graft, along with subtotal left main occlusion and and RCA occlusion - graft dependent No recurrent anginal symptoms. Continues to use Imdur and has not used when necessary nitroglycerin since last visit. She is on a beta blocker and dual antiplatelet therapy. She does not thinks that was due to intolerance. In due to renal insufficiency is not on an ACE inhibitor. She's also had some orthostatic near syncopal type symptoms that is from additional blood pressure reduction.  Ischemic cardiomyopathy, EF 45-50% 2D 03/13/12 Minimal tiny heart failure symptoms on current regimen. She does get exertional dyspnea if she overexerts, but otherwise is stable on current medication. She is taking standing dose of Lasix (although not usually twice a day) but has always had chronic edema. She is doing a sliding scale Lasix regimen.  Dyslipidemia with statin  intolerance On Zetia and WelChol. We'll need to check labs if not checked by PCP prior to next visit.  Essential hypertension Well controlled on current dose of propranolol. See above for reasons to avoid aggressive therapy.  DM (diabetes mellitus), type 2 with renal complications Appear to be relatively stable. She is not currently on any medications.  Obesity (BMI 30-39.9) Her activity is limited by arthritis. We again talked about monitoring her dietary intake.    Orders Placed This Encounter  Procedures  . EKG 12-Lead   No orders of the defined types were placed in this encounter.    Followup: 6 months  Chelli Yerkes W. Herbie Baltimore, M.D., M.S. Interventional Cardiology [facility]

## 2014-02-07 NOTE — Assessment & Plan Note (Signed)
Her activity is limited by arthritis. We again talked about monitoring her dietary intake.

## 2014-02-07 NOTE — Assessment & Plan Note (Signed)
She is now almost 2 years out from her most recent MI, and has a relatively preserved EF with only mild exertional dyspnea. No recurrent angina She and her daughter did indicate that she probably would not want to have any additional invasive procedures performed. She does not think she will tolerate any more. As per previous note, will continue DAPT DUE to the extensive amount of clot from her last MI.  If she has easy bruising or bleeding, would probably stop aspirin as opposed to Brilinta. In that case, we could also set her switching to Plavix.Marland Kitchen

## 2014-02-07 NOTE — Assessment & Plan Note (Signed)
No recurrent anginal symptoms. Continues to use Imdur and has not used when necessary nitroglycerin since last visit. She is on a beta blocker and dual antiplatelet therapy. She does not thinks that was due to intolerance. In due to renal insufficiency is not on an ACE inhibitor. She's also had some orthostatic near syncopal type symptoms that is from additional blood pressure reduction.

## 2014-02-07 NOTE — Assessment & Plan Note (Signed)
Minimal tiny heart failure symptoms on current regimen. She does get exertional dyspnea if she overexerts, but otherwise is stable on current medication. She is taking standing dose of Lasix (although not usually twice a day) but has always had chronic edema. She is doing a sliding scale Lasix regimen.

## 2014-02-07 NOTE — Assessment & Plan Note (Signed)
On Zetia and WelChol. We'll need to check labs if not checked by PCP prior to next visit.

## 2014-02-07 NOTE — Assessment & Plan Note (Signed)
Well controlled on current dose of propranolol. See above for reasons to avoid aggressive therapy.

## 2014-03-13 ENCOUNTER — Ambulatory Visit (INDEPENDENT_AMBULATORY_CARE_PROVIDER_SITE_OTHER): Payer: Medicare Other | Admitting: Podiatry

## 2014-03-13 DIAGNOSIS — M79673 Pain in unspecified foot: Secondary | ICD-10-CM

## 2014-03-13 DIAGNOSIS — M79609 Pain in unspecified limb: Secondary | ICD-10-CM

## 2014-03-13 DIAGNOSIS — B351 Tinea unguium: Secondary | ICD-10-CM

## 2014-03-13 NOTE — Progress Notes (Signed)
Subjective:     Patient ID: [name], female   DOB: Dec 05, 1920, 80 y.o.   MRN: [id number]  HPI patient presents with thick brittle nailbeds 1-5 both feet that are painful and she cannot cut   Review of Systems     Objective:   Physical Exam Neurovascular status intact with thick yellow brittle nailbeds 1-5 both feet that are painful    Assessment:     Mycotic nail infection with pain 1-5 both feet    Plan:     Debride painful nailbeds 1-5 both feet with no iatrogenic bleeding noted

## 2014-03-13 NOTE — Progress Notes (Signed)
   Subjective:    Patient ID: [name], female    DOB: 01-23-1921, 80 y.o.   MRN: [id number]  HPI  Pt presents for nail debridement  Review of Systems     Objective:   Physical Exam        Assessment & Plan:

## 2014-03-31 ENCOUNTER — Other Ambulatory Visit: Payer: Self-pay | Admitting: Cardiology

## 2014-03-31 NOTE — Telephone Encounter (Signed)
Rx was sent to pharmacy electronically. 

## 2014-04-12 ENCOUNTER — Emergency Department (HOSPITAL_COMMUNITY)
Admission: EM | Admit: 2014-04-12 | Discharge: 2014-04-12 | Disposition: A | Discharge status: Home / Self Care | Payer: Medicare Other | Attending: Emergency Medicine | Admitting: Emergency Medicine

## 2014-04-12 ENCOUNTER — Encounter (HOSPITAL_COMMUNITY): Payer: Self-pay | Admitting: Emergency Medicine

## 2014-04-12 ENCOUNTER — Emergency Department (HOSPITAL_COMMUNITY): Payer: Medicare Other

## 2014-04-12 DIAGNOSIS — M1990 Unspecified osteoarthritis, unspecified site: Secondary | ICD-10-CM | POA: Diagnosis not present

## 2014-04-12 DIAGNOSIS — F419 Anxiety disorder, unspecified: Secondary | ICD-10-CM | POA: Insufficient documentation

## 2014-04-12 DIAGNOSIS — Z8701 Personal history of pneumonia (recurrent): Secondary | ICD-10-CM | POA: Diagnosis not present

## 2014-04-12 DIAGNOSIS — E039 Hypothyroidism, unspecified: Secondary | ICD-10-CM | POA: Insufficient documentation

## 2014-04-12 DIAGNOSIS — R51 Headache: Secondary | ICD-10-CM | POA: Diagnosis not present

## 2014-04-12 DIAGNOSIS — I129 Hypertensive chronic kidney disease with stage 1 through stage 4 chronic kidney disease, or unspecified chronic kidney disease: Secondary | ICD-10-CM | POA: Insufficient documentation

## 2014-04-12 DIAGNOSIS — X58XXXA Exposure to other specified factors, initial encounter: Secondary | ICD-10-CM | POA: Diagnosis not present

## 2014-04-12 DIAGNOSIS — I4891 Unspecified atrial fibrillation: Secondary | ICD-10-CM | POA: Diagnosis not present

## 2014-04-12 DIAGNOSIS — Z951 Presence of aortocoronary bypass graft: Secondary | ICD-10-CM | POA: Diagnosis not present

## 2014-04-12 DIAGNOSIS — S46812A Strain of other muscles, fascia and tendons at shoulder and upper arm level, left arm, initial encounter: Secondary | ICD-10-CM | POA: Insufficient documentation

## 2014-04-12 DIAGNOSIS — Z79899 Other long term (current) drug therapy: Secondary | ICD-10-CM | POA: Diagnosis not present

## 2014-04-12 DIAGNOSIS — M109 Gout, unspecified: Secondary | ICD-10-CM | POA: Diagnosis not present

## 2014-04-12 DIAGNOSIS — I2119 ST elevation (STEMI) myocardial infarction involving other coronary artery of inferior wall: Secondary | ICD-10-CM | POA: Insufficient documentation

## 2014-04-12 DIAGNOSIS — R7989 Other specified abnormal findings of blood chemistry: Secondary | ICD-10-CM | POA: Insufficient documentation

## 2014-04-12 DIAGNOSIS — N183 Chronic kidney disease, stage 3 (moderate): Secondary | ICD-10-CM | POA: Diagnosis not present

## 2014-04-12 DIAGNOSIS — Y9389 Activity, other specified: Secondary | ICD-10-CM | POA: Diagnosis not present

## 2014-04-12 DIAGNOSIS — Z8619 Personal history of other infectious and parasitic diseases: Secondary | ICD-10-CM | POA: Diagnosis not present

## 2014-04-12 DIAGNOSIS — E1129 Type 2 diabetes mellitus with other diabetic kidney complication: Secondary | ICD-10-CM | POA: Insufficient documentation

## 2014-04-12 DIAGNOSIS — Z9861 Coronary angioplasty status: Secondary | ICD-10-CM | POA: Diagnosis not present

## 2014-04-12 DIAGNOSIS — Y929 Unspecified place or not applicable: Secondary | ICD-10-CM | POA: Diagnosis not present

## 2014-04-12 DIAGNOSIS — M79602 Pain in left arm: Secondary | ICD-10-CM | POA: Diagnosis present

## 2014-04-12 LAB — CBC WITH DIFFERENTIAL/PLATELET
Basophils Absolute: 0 10*3/uL (ref 0.0–0.1)
Basophils Relative: 0 % (ref 0–1)
EOS ABS: 0.1 10*3/uL (ref 0.0–0.7)
Eosinophils Relative: 1 % (ref 0–5)
HCT: 36.1 % (ref 36.0–46.0)
Hemoglobin: 11.3 g/dL — ABNORMAL LOW (ref 12.0–15.0)
Lymphocytes Relative: 22 % (ref 12–46)
Lymphs Abs: 1 10*3/uL (ref 0.7–4.0)
MCH: 24.8 pg — AB (ref 26.0–34.0)
MCHC: 31.3 g/dL (ref 30.0–36.0)
MCV: 79.3 fL (ref 78.0–100.0)
MONOS PCT: 10 % (ref 3–12)
Monocytes Absolute: 0.5 10*3/uL (ref 0.1–1.0)
Neutro Abs: 3.1 10*3/uL (ref 1.7–7.7)
Neutrophils Relative %: 66 % (ref 43–77)
PLATELETS: 183 10*3/uL (ref 150–400)
RBC: 4.55 MIL/uL (ref 3.87–5.11)
RDW: 19.8 % — ABNORMAL HIGH (ref 11.5–15.5)
WBC: 4.6 10*3/uL (ref 4.0–10.5)

## 2014-04-12 LAB — URINALYSIS, ROUTINE W REFLEX MICROSCOPIC
BILIRUBIN URINE: NEGATIVE
GLUCOSE, UA: NEGATIVE mg/dL
HGB URINE DIPSTICK: NEGATIVE
Ketones, ur: NEGATIVE mg/dL
Nitrite: NEGATIVE
Protein, ur: NEGATIVE mg/dL
SPECIFIC GRAVITY, URINE: 1.016 (ref 1.005–1.030)
UROBILINOGEN UA: 0.2 mg/dL (ref 0.0–1.0)
pH: 7 (ref 5.0–8.0)

## 2014-04-12 LAB — URINE MICROSCOPIC-ADD ON

## 2014-04-12 LAB — COMPREHENSIVE METABOLIC PANEL
ALT: 13 U/L (ref 0–35)
AST: 18 U/L (ref 0–37)
Albumin: 3.4 g/dL — ABNORMAL LOW (ref 3.5–5.2)
Alkaline Phosphatase: 135 U/L — ABNORMAL HIGH (ref 39–117)
Anion gap: 13 (ref 5–15)
BUN: 52 mg/dL — AB (ref 6–23)
CALCIUM: 9.8 mg/dL (ref 8.4–10.5)
CO2: 29 mEq/L (ref 19–32)
CREATININE: 2.42 mg/dL — AB (ref 0.50–1.10)
Chloride: 101 mEq/L (ref 96–112)
GFR, EST AFRICAN AMERICAN: 19 mL/min — AB (ref >90)
GFR, EST NON AFRICAN AMERICAN: 17 mL/min — AB (ref >90)
GLUCOSE: 121 mg/dL — AB (ref 70–99)
Potassium: 3.5 mEq/L — ABNORMAL LOW (ref 3.7–5.3)
SODIUM: 143 meq/L (ref 137–147)
Total Bilirubin: <0.2 mg/dL — ABNORMAL LOW (ref 0.3–1.2)
Total Protein: 7.1 g/dL (ref 6.0–8.3)

## 2014-04-12 LAB — I-STAT TROPONIN, ED: TROPONIN I, POC: 0.01 ng/mL (ref 0.00–0.08)

## 2014-04-12 MED ORDER — SODIUM CHLORIDE 0.9 % IV BOLUS (SEPSIS)
500.0000 mL | Freq: Once | INTRAVENOUS | Status: AC
  Administered 2014-04-12: 500 mL via INTRAVENOUS

## 2014-04-12 MED ORDER — DIAZEPAM 5 MG PO TABS: 2.5000 mg | ORAL_TABLET | Freq: Every day | ORAL | Status: DC

## 2014-04-12 MED ORDER — LORAZEPAM 2 MG/ML IJ SOLN
0.5000 mg | Freq: Once | INTRAMUSCULAR | Status: AC
  Administered 2014-04-12: 0.5 mg via INTRAVENOUS
  Filled 2014-04-12: qty 1

## 2014-04-12 NOTE — ED Provider Notes (Signed)
CSN: [id number]     Arrival date & time 04/12/14  1010 History   First MD Initiated Contact with Patient 04/12/14 1114     Chief Complaint  Patient presents with  . Arm Pain  . Numbness     (Consider location/radiation/quality/duration/timing/severity/associated sxs/prior Treatment) HPI [name] is a 80 y.o. female with PMH of CAD with PCI, dyslipidemia, hypertension, diabetes, CKD presenting with decreased sensation to left arm and pain. Patient reports it is colder than the other. This started this morning. Patient also reports HA, dull and like typical headaches. She has blurred vision but this is unchanged. No slurred speech. No other weakness. Patient with bilateral leg pain after starting diclofenac cream to legs. No new weakness to bilateral legs. Patient with neuropathy and chronic weakness to bilateral legs. Patient walks with cane normally.  No change in medication other than no longer taking the Welchol. No new injuries or trauma. No chest pain or shortness of breath.    Past Medical History  Diagnosis Date  . ST elevation myocardial infarction (STEMI) of inferior wall 05/2011; 03/10/2012    a) 11/'12: 100% RCA, dLM 80% -- POBA of RCA (for planned CABG, not done until re-admission with NSTEMI 1/'13);; b) 8/'13: 100% SVG-RCA (PCI & re-PCI), native RCA 100%; Patent LIMA-p-mLAD, SVG-OM  . CAD S/P percutaneous coronary angioplasty 11/'12; 8/31 & 9/1/'13    a) MV: 100% RCA-POBA, dLM left main 80%; b) 1/'13: NSTEMI --> CABG; c) 2013: 8/31 - 100% RCA & acute SVG-RCA, 100% SVG-OM, 90% LM, 80% p&mLAD, ~70% RI, 50% Cx --> PCI-SVG-RCA: Promus Element DES x 3 (prox 2.5 mm x 38 mm & 2.5 mm x 16 mm, distal 2.5 mm x 16 mm); on 9/1 - accute in-stent Thrombosis - Aspiration thrombectomy & PTCA  . S/P CABG x 4 08/05/11    Dr. Tyrone Sage: LIMA-p-mLAD, SVG-RPDA, SVG-OM; known  100% SVG-OM, Extensive PTCA of SVG-RCA;;  Hospital course complicated by Afib & PNA; after d/c cellulitis of SVG harvest  site due to edema  . Episodic atrial fibrillation 07/2011    Post-Op CABG  . Ischemic cardiomyopathy 03/2012    Echo: EF 45-50% - Mild basal- mid inferolateral Hypokinesis: Gr 1 DD, mildly increased PAP.  Marland Kitchen Dyslipidemia, goal LDL below 70     statin intolerance (lipitor, crestor drug reaction); Welchol & Zetia  . Hypertension, essential   . DM (diabetes mellitus), type 2 with renal complications   . Chronic kidney disease (CKD) stage G3b/A1, moderately decreased glomerular filtration rate (GFR) between 30-44 mL/min/1.73 square meter and albuminuria creatinine ratio less than 30 mg/g   . History of pneumonia     post-op from CABG  . Hypothyroidism     on synthroid  . Headache(784.0)   . Osteoarthritis   . Anxiety     PRN Xanax   . Dermatophytosis of nail   . Gout     on Allopurinol   Past Surgical History  Procedure Laterality Date  . Cardiac catheterization  November 2012; August and September 2013    Patent LIMA-LAD, patent she had an OM, patent stented SVG-RCA; occluded native RCA, 90% ostial left main, tandem 80% proximal and mid LAD, 70% mid Circumflex  . Coronary angioplasty  November 2012    PTCA only of RCA and setting of inferior STEMI  . Coronary artery bypass graft  08/05/2011    Procedure: CORONARY ARTERY BYPASS GRAFTING (CABG);  Surgeon: [name], MD;  Location: [facility] OR;  Service: Open Heart Surgery;  Laterality: N/A;  coronary artery bypass graft times 4 using left internal mammary artery and right leg saphenous vein harvested endoscopically  . Joint replacement      both hips replaced  . I&d extremity  09/21/2011    Procedure: IRRIGATION AND DEBRIDEMENT EXTREMITY;  Surgeon: [name], MD;  Location: [facility] OR;  Service: Vascular;  Laterality: Right;  with wound vac placement  . Coronary angioplasty with stent placement  03/10/2012     infferior STEMI: Occluded native RCA and SVG-RCA --> thrombectomy and 3 stent placement to the SVG-RCA (2.5 mm right 38 mm and 2  proximal distal overlapping 2.5 mm x 16 mm Promus DES stents:  . Coronary angioplasty  03/11/2012    Inferior STEMI #3: Reoccluded SVG-RCA; extensive thrombectomy and post dilation PTCA  . Transthoracic echocardiogram  September 2013    EF 45-50%, mild hypokinesis of the basal and mid inferolateral wall, grade 1 diastolic dysfunction, mildly increased artery pressures.   Family History  Problem Relation Age of Onset  . Breast cancer Mother   . Heart disease Father    History  Substance Use Topics  . Smoking status: Never Smoker   . Smokeless tobacco: Never Used  . Alcohol Use: 0.6 oz/week    1 Glasses of wine per week   OB History   Grav Para Term Preterm Abortions TAB SAB Ect Mult Living                 Review of Systems  Constitutional: Negative for fever and chills.  HENT: Negative for congestion and rhinorrhea.   Eyes: Negative for visual disturbance.  Respiratory: Negative for cough and shortness of breath.   Cardiovascular: Negative for chest pain and palpitations.  Gastrointestinal: Negative for nausea, vomiting and diarrhea.  Genitourinary: Negative for dysuria and hematuria.  Musculoskeletal: Negative for back pain and gait problem.  Skin: Negative for rash.  Neurological: Positive for numbness and headaches. Negative for weakness.      Allergies  Tape and Statins  Home Medications   Prior to Admission medications   Medication Sig Start Date End Date Taking? Authorizing Provider  acetaminophen (TYLENOL) 325 MG tablet Take 650 mg by mouth 2 (two) times daily.    Historical Provider, MD  allopurinol (ZYLOPRIM) 100 MG tablet Take 100 mg by mouth daily.     Historical Provider, MD  BRILINTA 90 MG TABS tablet TAKE 1 TABLET (90 MG TOTAL) BY MOUTH 2 (TWO) TIMES DAILY. 03/31/14   Marykay Lex, MD  carvedilol (COREG) 6.25 MG tablet Take 6.25 mg by mouth 2 (two) times daily with a meal.    Historical Provider, MD  colesevelam [facility]) 625 MG tablet Take 3 tablets  (1,875 mg total) by mouth 2 (two) times daily with a meal. 08/15/11 02/03/14  Dwana Melena, PA-C  diazepam (VALIUM) 5 MG tablet Take 0.5 tablets (2.5 mg total) by mouth daily. 04/12/14   Louann Sjogren, PA-C  ezetimibe (ZETIA) 10 MG tablet Take 1 tablet (10 mg total) by mouth daily. 08/15/11 02/03/14  Dwana Melena, PA-C  furosemide (LASIX) 20 MG tablet Take 40 mg by mouth 2 (two) times daily.  08/14/11 02/03/14  [name] Margaretann Loveless, PA-C  isosorbide mononitrate (IMDUR) 30 MG 24 hr tablet Take 1 tablet (30 mg total) by mouth daily. 03/15/12 02/03/14  Abelino Derrick, PA-C  levothyroxine (SYNTHROID, LEVOTHROID) 100 MCG tablet Take 100 mcg by mouth daily.     Historical Provider, MD  LORazepam (ATIVAN) 0.5 MG  tablet 0.5 mg daily.  01/16/13   Historical Provider, MD  pantoprazole (PROTONIX) 40 MG tablet Take 1 tablet (40 mg total) by mouth daily at 12 noon. 06/01/11 02/03/14  Abelino Derrick, PA-C  sodium bicarbonate 650 MG tablet Take 650 mg by mouth daily.      Historical Provider, MD   BP 123/56  Pulse 60  Temp(Src) 97.8 F (36.6 C) (Oral)  Resp 15  Ht 5\' 1"  (1.549 m)  Wt 159 lb (72.122 kg)  BMI 30.06 kg/m2  SpO2 97% Physical Exam  Nursing note and vitals reviewed. Constitutional: She appears well-developed and well-nourished. No distress.  HENT:  Head: Normocephalic and atraumatic.  Mouth/Throat: Oropharynx is clear and moist.  Eyes: Conjunctivae and EOM are normal. Pupils are equal, round, and reactive to light. Right eye exhibits no discharge. Left eye exhibits no discharge.  Neck: Normal range of motion. Neck supple.  No nuchal rigidity  Cardiovascular: Normal rate and regular rhythm.   Pulmonary/Chest: Effort normal and breath sounds normal. No respiratory distress. She has no wheezes.  Abdominal: Soft. Bowel sounds are normal. She exhibits no distension. There is no tenderness.  Musculoskeletal:  No midline back tenderness, step off or crepitus. Right Left sided lower back tenderness. No CVA  tenderness. Full range of motion of left shoulder and arm without tenderness. Point tenderness to left trapezius  Neurological: She is alert. No cranial nerve deficit. Coordination normal.  Strength 5/5 in upper extremities. 4/5 strength to lower extremities. Sensation discrepancy of left vs right arm. Remainder of sensation intact. Intact rapid alternating movements, finger to nose. No pronator drift.   Skin: Skin is warm and dry. She is not diaphoretic.    ED Course  Procedures (including critical care time) Labs Review Labs Reviewed  URINALYSIS, ROUTINE W REFLEX MICROSCOPIC - Abnormal; Notable for the following:    APPearance CLOUDY (*)    Leukocytes, UA MODERATE (*)    All other components within normal limits  CBC WITH DIFFERENTIAL - Abnormal; Notable for the following:    Hemoglobin 11.3 (*)    MCH 24.8 (*)    RDW 19.8 (*)    All other components within normal limits  COMPREHENSIVE METABOLIC PANEL - Abnormal; Notable for the following:    Potassium 3.5 (*)    Glucose, Bld 121 (*)    BUN 52 (*)    Creatinine, Ser 2.42 (*)    Albumin 3.4 (*)    Alkaline Phosphatase 135 (*)    Total Bilirubin <0.2 (*)    GFR calc non Af Amer 17 (*)    GFR calc Af Amer 19 (*)    All other components within normal limits  URINE MICROSCOPIC-ADD ON - Abnormal; Notable for the following:    Bacteria, UA FEW (*)    All other components within normal limits  I-STAT TROPOININ, ED    Imaging Review Dg Chest 2 View  04/12/2014   CLINICAL DATA:  Chest pain which travels from neck to left arm since yesterday. Shortness of breath. History of coronary artery disease, post coronary artery stent placement and CABG. Initial encounter.  EXAM: CHEST  2 VIEW  COMPARISON:  04/07/2014; 07/17/2013; 03/12/2012  FINDINGS: Grossly unchanged borderline enlarged cardiac silhouette and mediastinal contours post median sternotomy and CABG. Atherosclerotic plaque within a mildly tortuous thoracic aorta. No focal airspace  opacities. There is persistent mild eventration of the bilateral hemidiaphragms. No pleural effusion or pneumothorax. No evidence of edema. No acute osseus abnormalities.  IMPRESSION: No acute  cardiopulmonary disease.   Electronically Signed   By: Simonne ComeJohn  Watts M.D.   On: 04/12/2014 13:38   Ct Head Wo Contrast  04/12/2014   CLINICAL DATA:  numbness left arm and bilateral legs,  EXAM: CT HEAD WITHOUT CONTRAST  TECHNIQUE: Contiguous axial images were obtained from the base of the skull through the vertex without intravenous contrast.  COMPARISON:  11/08/2011  FINDINGS: Chronic mucoperiosteal thickening in the visualized right maxillary sinus. Atherosclerotic and physiologic intracranial calcifications. Mild atrophy. There is no evidence of acute intracranial hemorrhage, brain edema, mass lesion, acute infarction, mass effect, or midline shift. Acute infarct may be inapparent on noncontrast CT. No other intra-axial abnormalities are seen, and the ventricles and sulci are within normal limits in size and symmetry. No abnormal extra-axial fluid collections or masses are identified. No significant calvarial abnormality.  IMPRESSION: 1. Negative for bleed or other acute intracranial process.   Electronically Signed   By: Oley Balmaniel  Hassell M.D.   On: 04/12/2014 13:26     EKG Interpretation   Date/Time:  Saturday April 12 2014 10:32:13 EDT Ventricular Rate:  60 PR Interval:  220 QRS Duration: 78 QT Interval:  410 QTC Calculation: 410 R Axis:   -13 Text Interpretation:  Sinus rhythm with 1st degree A-V block Moderate  voltage criteria for LVH, may be normal variant Inferior infarct , age  undetermined Anterior infarct , age undetermined No significant change  since last tracing Confirmed by [facility] ScottLUNKETT  MD, Alphonzo LemmingsWHITNEY ([id number]) on 04/12/2014  12:14:21 PM     Meds given in ED:  Medications  sodium chloride 0.9 % bolus 500 mL (0 mLs Intravenous Stopped 04/12/14 1320)  LORazepam (ATIVAN) injection 0.5 mg (0.5 mg  Intravenous Given 04/12/14 1211)    Discharge Medication List as of 04/12/2014  2:00 PM    START taking these medications   Details  diazepam (VALIUM) 5 MG tablet Take 0.5 tablets (2.5 mg total) by mouth daily., Starting 04/12/2014, Until Discontinued, Print          MDM   Final diagnoses:  Strain of trapezius muscle, left, initial encounter  Elevated serum creatinine   Patient with subjective sensation changes in left arm. Pt with multiple comorbidities and advanced age. Pt with HA that improved while in ED.  Presentation is like pt's typical HA, gradual in onset, not maximal in onset, and not worse of life. No visual or speech changes, no N/V, and no new weakness. Pt is afebrile with no focal neuro deficits or nuchal rigidity. Head CT without acute findings. I doubt SAH, ICH, meningits. Patient with point tenderness to left trapezius. I suspect muscular strain. Patient to take valium and symptomatic treatment of shoulder. Patient to follow up with PCP and patient to call on Monday to make appointment with PCP next week due to elevated creatinine as well. Last lab value in system 2 years ago. Patient to stop diclofenac until appointment and not take NSAIDs.  Discussed return precautions with patient. Discussed all results and patient verbalizes understanding and agrees with plan.  This is a shared patient. This patient was discussed with the physician who saw and evaluated the patient and agrees with the plan.       Louann SjogrenVictoria L Cadell Gabrielson, PA-C 04/12/14 [phone]

## 2014-04-12 NOTE — ED Notes (Signed)
To radiology

## 2014-04-12 NOTE — ED Notes (Signed)
Pt c/o left arm pain and numbness to B/L legs. Pt seen by PMD recently and given cream to apply to knee, pt was also taking off of a medication.

## 2014-04-12 NOTE — Discharge Instructions (Signed)
Return to the emergency room with worsening of symptoms, new symptoms or with symptoms that are concerning. Start taking valium for shoulder pain. Heating pad to shoulder for 20 mins twice a day.  Follow up with Primary care provider due to elevation in creatinine. Creatinine is test of kidneys. Stop taking Diclofenac. Do not take any NSAIDS including ibuprofen or motrin or naproxen.

## 2014-04-12 NOTE — ED Notes (Signed)
Pt remains out of dept

## 2014-04-13 NOTE — ED Provider Notes (Signed)
Medical screening examination/treatment/procedure(s) were conducted as a shared visit with non-physician practitioner(s) and myself.  I personally evaluated the patient during the encounter.   EKG Interpretation   Date/Time:  Saturday April 12 2014 10:32:13 EDT Ventricular Rate:  60 PR Interval:  220 QRS Duration: 78 QT Interval:  410 QTC Calculation: 410 R Axis:   -13 Text Interpretation:  Sinus rhythm with 1st degree A-V block Moderate  voltage criteria for LVH, may be normal variant Inferior infarct , age  undetermined Anterior infarct , age undetermined No significant change  since last tracing Confirmed by [facility]  MD, Alphonzo Lemmings ([id number]) on 04/12/2014  12:14:21 PM      Patient presenting with vague complaints of pain in her left arm that started yesterday. She denies any numbness of the left arm but states it feels weak because hurts when she moves it. She denies any trauma, chest pain, shortness of breath or weakness in the lower extremities. No speech difficulty or new vision abnormalities. On exam patient is awake alert and able to answer all questions independently. She can move upper extremities without difficulty however has point tenderness over the left trapezius. Low suspicion at this time her cardiac cause this patient's EKG is unchanged, has a normal pulse, has a negative troponin. Also at this time low suspicion for stroke his symptoms have been going on greater than 24 hours and has a negative head CT as well as atypical story. Findings discussed with the patient and her family they're comfortable taking the patient home and given strict return precautions. Patient will follow up with her doctor next week  Gwyneth Sprout, MD 04/13/14 [phone]

## 2014-05-08 ENCOUNTER — Telehealth: Payer: Self-pay | Admitting: Cardiology

## 2014-05-08 NOTE — Telephone Encounter (Signed)
Close encounter 

## 2014-06-18 ENCOUNTER — Encounter (HOSPITAL_COMMUNITY): Payer: Self-pay | Admitting: Cardiology

## 2014-06-19 ENCOUNTER — Encounter (HOSPITAL_COMMUNITY): Payer: Self-pay | Admitting: Cardiology

## 2014-07-14 DIAGNOSIS — I10 Essential (primary) hypertension: Secondary | ICD-10-CM | POA: Diagnosis not present

## 2014-07-14 DIAGNOSIS — E789 Disorder of lipoprotein metabolism, unspecified: Secondary | ICD-10-CM | POA: Diagnosis not present

## 2014-07-14 DIAGNOSIS — E0789 Other specified disorders of thyroid: Secondary | ICD-10-CM | POA: Diagnosis not present

## 2014-07-14 DIAGNOSIS — Z79899 Other long term (current) drug therapy: Secondary | ICD-10-CM | POA: Diagnosis not present

## 2014-07-21 DIAGNOSIS — I10 Essential (primary) hypertension: Secondary | ICD-10-CM | POA: Diagnosis not present

## 2014-07-21 DIAGNOSIS — E789 Disorder of lipoprotein metabolism, unspecified: Secondary | ICD-10-CM | POA: Diagnosis not present

## 2014-07-21 DIAGNOSIS — E790 Hyperuricemia without signs of inflammatory arthritis and tophaceous disease: Secondary | ICD-10-CM | POA: Diagnosis not present

## 2014-07-29 ENCOUNTER — Encounter: Payer: Self-pay | Admitting: *Deleted

## 2014-08-04 ENCOUNTER — Ambulatory Visit: Payer: Medicare Other | Admitting: Cardiology

## 2014-08-04 ENCOUNTER — Telehealth: Payer: Self-pay | Admitting: Cardiology

## 2014-08-04 NOTE — Telephone Encounter (Signed)
Close encounter 

## 2014-08-06 ENCOUNTER — Encounter: Payer: Self-pay | Admitting: Cardiology

## 2014-08-12 DIAGNOSIS — H2513 Age-related nuclear cataract, bilateral: Secondary | ICD-10-CM | POA: Diagnosis not present

## 2014-08-12 DIAGNOSIS — E119 Type 2 diabetes mellitus without complications: Secondary | ICD-10-CM | POA: Diagnosis not present

## 2014-08-15 ENCOUNTER — Ambulatory Visit: Payer: Self-pay | Admitting: Cardiology

## 2014-08-15 ENCOUNTER — Ambulatory Visit (INDEPENDENT_AMBULATORY_CARE_PROVIDER_SITE_OTHER): Payer: Medicare Other | Admitting: Cardiology

## 2014-08-15 VITALS — BP 161/74 | HR 59 | Ht 61.0 in | Wt 155.6 lb

## 2014-08-15 DIAGNOSIS — I25812 Atherosclerosis of bypass graft of coronary artery of transplanted heart without angina pectoris: Secondary | ICD-10-CM | POA: Diagnosis not present

## 2014-08-15 DIAGNOSIS — E669 Obesity, unspecified: Secondary | ICD-10-CM

## 2014-08-15 DIAGNOSIS — I255 Ischemic cardiomyopathy: Secondary | ICD-10-CM

## 2014-08-15 DIAGNOSIS — I10 Essential (primary) hypertension: Secondary | ICD-10-CM | POA: Diagnosis not present

## 2014-08-15 DIAGNOSIS — E785 Hyperlipidemia, unspecified: Secondary | ICD-10-CM | POA: Diagnosis not present

## 2014-08-15 DIAGNOSIS — I2510 Atherosclerotic heart disease of native coronary artery without angina pectoris: Secondary | ICD-10-CM

## 2014-08-15 MED ORDER — TICAGRELOR 90 MG PO TABS: ORAL_TABLET | ORAL | Status: DC

## 2014-08-15 MED ORDER — CARVEDILOL 6.25 MG PO TABS: 6.2500 mg | ORAL_TABLET | Freq: Two times a day (BID) | ORAL | Status: DC

## 2014-08-15 NOTE — Patient Instructions (Signed)
Your physician wants you to follow-up in: 6 months or sooner with Dr. Herbie Baltimore. You will receive a reminder letter in the mail two months in advance. If you don't receive a letter, please call our office to schedule the follow-up appointment.

## 2014-08-17 ENCOUNTER — Encounter: Payer: Self-pay | Admitting: Cardiology

## 2014-08-17 NOTE — Assessment & Plan Note (Signed)
She is 3 years out from CABG and 2 half years out from her vein graft to RCA occlusion. Most recent echo following that last event showed only mild reduction in EF. She is not very active and therefore may not know if she's having any exertional angina she does have suggestion of inferior wall hypokinesis that correlates with her to RCA related infarcts.

## 2014-08-17 NOTE — Assessment & Plan Note (Signed)
She is a bit more hypertensive today than she had been  In the past. She is on carvedilol. This is the first time seizure being significantly hypertensive. I would like to reassess that. On my recheck, I got 140/70 mmHg.   She says at home her values are usually in the 140-150 mm mercury systolic range. Because she has positional dizziness and poor balance I am inclined to allow for mild permissive hypertension as long as she is not having any heart failure symptoms.

## 2014-08-17 NOTE — Assessment & Plan Note (Signed)
Unfortunately we are limited by her significant arthritis. Dietary changes are the mainstay --> she has lost ~10 lb since July.

## 2014-08-17 NOTE — Assessment & Plan Note (Signed)
No active anginal symptoms. She is on a good dose of Imdur and carvedilol. Because of the extensive amount of stent in her RCA she is on Brilinta but we stop the aspirin. No further bleeding. She is not on statin due to intolerance but is on Zetia.

## 2014-08-17 NOTE — Assessment & Plan Note (Addendum)
Minimal heart failure symptoms. Her edema is much improved she denies any PND or orthopnea. She does have exertional dyspnea, which may at least in part be related to deconditioning. She is on carvedilol and Imdur with standing dose of Lasix. We discussed again the concept of sliding scale Lasix which she is quite familiar with.

## 2014-08-17 NOTE — Assessment & Plan Note (Signed)
Statin intolerant. On Zetia and WelChol. Lipids have initially improved, but it seemed to increased over the last few years. I discussed importance of dietary modifications.

## 2014-08-17 NOTE — Progress Notes (Signed)
PATIENTCALISA [name] MRN: [id number]  DOB: 1921-02-25   DOV:08/17/2014 PCP: Michiel Sites, MD  Clinic Note: Chief Complaint  Patient presents with  . 6 mo rov    patient reports only occas. SOB.  no other problems   HPI: [name] is a 81 y.o.  female with a PMH below who presents today for 6 month follow-up for complex CAD history noted below.    Interval History: She comes in today again doing very well with the only complaint being occasional exertional dyspnea.. She is able to get around the house with a cane without much problem from a cardiac or retro-standpoint. She is mostly troubled by pain in her knee.. She does occasionally get exertional dyspnea doing house chores but no chest tightness or pressure to suggest angina. She denies any PND, orthopnea to go with her stable edema. Back she thinks her swelling is all but gone -- she is currently taking 40 mg of Lasix once a day. She denies any rapid or irregular heart beats/palpitations or arrhythmias. No recurrence of her syncopal or near syncopal type symptoms. No TIA or amaurosis fugax symptoms. Notably no recurrence of dark tarry stools. No claudication  Past Medical History  Diagnosis Date  . ST elevation myocardial infarction (STEMI) of inferior wall 05/2011; 03/10/2012    a) 11/'12: 100% RCA, dLM 80% -- POBA of RCA (for planned CABG, not done until re-admission with NSTEMI 1/'13);; b) 8/'13: 100% SVG-RCA (PCI & re-PCI), native RCA 100%; Patent LIMA-p-mLAD, SVG-OM  . CAD S/P percutaneous coronary angioplasty 11/'12; 8/31 & 9/1/'13    a) MV: 100% RCA-POBA, dLM left main 80%; b) 1/'13: NSTEMI --> CABG; c) 2013: 8/31 - 100% RCA & acute SVG-RCA, 100% SVG-OM, 90% LM, 80% p&mLAD, ~70% RI, 50% Cx --> PCI-SVG-RCA: Promus Element DES x 3 (prox 2.5 mm x 38 mm & 2.5 mm x 16 mm, distal 2.5 mm x 16 mm); on 9/1 - accute in-stent Thrombosis - Aspiration thrombectomy & PTCA  . S/P CABG x 4 08/05/11    Dr. Tyrone Sage: LIMA-p-mLAD,  SVG-RPDA, SVG-OM; known  100% SVG-OM, Extensive PTCA of SVG-RCA;;  Hospital course complicated by Afib & PNA; after d/c cellulitis of SVG harvest site due to edema  . Episodic atrial fibrillation 07/2011    Post-Op CABG  . Ischemic cardiomyopathy 03/2012    Echo: EF 45-50% - Mild basal- mid inferolateral Hypokinesis: Gr 1 DD, mildly increased PAP.  Marland Kitchen Dyslipidemia, goal LDL below 70     statin intolerance (lipitor, crestor drug reaction); Welchol & Zetia  . Hypertension, essential   . DM (diabetes mellitus), type 2 with renal complications   . Chronic kidney disease (CKD) stage G3b/A1, moderately decreased glomerular filtration rate (GFR) between 30-44 mL/min/1.73 square meter and albuminuria creatinine ratio less than 30 mg/g   . History of pneumonia     post-op from CABG  . Hypothyroidism     on synthroid  . Headache(784.0)   . Osteoarthritis   . Anxiety     PRN Xanax   . Dermatophytosis of nail   . Gout     on Allopurinol  . Lower extremity edema     chronic   Prior Cardiac Evaluation and Past Surgical History: Reviewed in Epic - Pertinent details in PMH  Echocardiogram: September 2013 - EF 45-50%, mild concentric LVH, mild HK of basal-mid inferolateral wall. G1 DD.  Mild pulmonary hypertension (PAP 35 mm mercury)   Allergies  Allergen Reactions  . Tape  Use paper tape.  . Statins Rash   Current Outpatient Prescriptions on File Prior to Visit  Medication Sig Dispense Refill  . acetaminophen (TYLENOL) 325 MG tablet Take 650 mg by mouth 2 (two) times daily.    Marland Kitchen allopurinol (ZYLOPRIM) 100 MG tablet Take 100 mg by mouth daily.     . diazepam (VALIUM) 5 MG tablet Take 0.5 tablets (2.5 mg total) by mouth daily. 5 tablet 0  . levothyroxine (SYNTHROID, LEVOTHROID) 100 MCG tablet Take 100 mcg by mouth daily.     Marland Kitchen LORazepam (ATIVAN) 0.5 MG tablet 0.5 mg daily.     . sodium bicarbonate 650 MG tablet Take 650 mg by mouth daily.      . colesevelam (WELCHOL) 625 MG tablet Take 3  tablets (1,875 mg total) by mouth 2 (two) times daily with a meal. 180 tablet 5  . ezetimibe (ZETIA) 10 MG tablet Take 1 tablet (10 mg total) by mouth daily. 30 tablet 5  . isosorbide mononitrate (IMDUR) 30 MG 24 hr tablet Take 1 tablet (30 mg total) by mouth daily. 30 tablet 11  . pantoprazole (PROTONIX) 40 MG tablet Take 1 tablet (40 mg total) by mouth daily at 12 noon. 30 tablet 5   No current facility-administered medications on file prior to visit.   No Change to Social & Family History - reviewed in Epic  ROS: A comprehensive Review of Systems was performed: Review of Systems  Constitutional: Negative for malaise/fatigue.  Cardiovascular: Positive for leg swelling (Notably improved). Negative for claudication.  Gastrointestinal: Positive for constipation. Negative for blood in stool and melena.  Genitourinary: Negative for hematuria.  Musculoskeletal: Positive for joint pain (Knees and back).       She has a very difficult time walking due to knee pain and angled knee from arthritis.  Neurological: Positive for dizziness (Occasional positional dizziness. And with rapid movement).  Endo/Heme/Allergies: Does not bruise/bleed easily.  Psychiatric/Behavioral: Negative for depression. The patient is not nervous/anxious.   All other systems reviewed and are negative.  Wt Readings from Last 3 Encounters:  08/15/14 155 lb 9.6 oz (70.58 kg)  04/12/14 159 lb (72.122 kg)  02/03/14 165 lb 11.2 oz (75.161 kg)   PHYSICAL EXAM BP 161/74 mmHg  Pulse 59  Ht 5\' 1"  (1.549 m)  Wt 155 lb 9.6 oz (70.58 kg)  BMI 29.42 kg/m2 General appearance: A&O x3 , cooperative, appears stated age, no distress, mildly obese and Very pleasant, well-nourished and well-groomed. Despite her age, she answers questions appropriately, and is quickwitted.  Neck: supple; No LAD, carotid bruit, or JVD  HEENT: [place]/AT, EOMI, MMM, anicteric sclera with mild arcus senilis  Lungs: CTAB, normal percussion bilaterally and  Nonlabored, good air movement  Heart: RRR, S1 & S2 normal, no S3 or S4; Harsh 2/6 c-d SEM @ RUSB--> carotids; no click or rub  Abdomen: soft, non-tender; bowel sounds normal; no masses, no organomegaly  Extremities: edema 1-2+ bilateral pitting edema. Stable. No significant venous stasis changes noted. and no ulcers -- SVG harvest scars - well healed; Pulses: 2+ and symmetric Neurologic: Grossly normal;    Adult ECG Report  not checked  Recent Labs: from 07/14/2014 -See scanned report  NMR Panel: TC 221;  HDL 80, HDL-P 34.7,LDL 1:15/LDL P. 1225, small LDL P. Less than 90. -- with exception of LDL not being a girl, her sizes of LDL and HDL are excellent. Levels seem to correlate with Screening level  Sodium 137, potassium 3.8, chloride 16, bicarbonate 26, BUN  39, creatinine 1.6, glucose 14, calcium 10  ASSESSMENT / PLAN: Inf STEMI #1 -PTCA to RCA (05/2011) + LM 80;NSTEMI (07/2011) - CABG x3;  inf STEMI #2 -- SVG-RCA  PCI with DES X 3 on 03/10/12; with re-occlusion 24hrs later (inferior STEMI #3) - redo thrombectomy/PTCA She is 3 years out from CABG and 2 half years out from her vein graft to RCA occlusion. Most recent echo following that last event showed only mild reduction in EF. She is not very active and therefore may not know if she's having any exertional angina she does have suggestion of inferior wall hypokinesis that correlates with her to RCA related infarcts.   Ischemic cardiomyopathy, EF 45-50% 2D 03/13/12 Minimal heart failure symptoms. Her edema is much improved she denies any PND or orthopnea. She does have exertional dyspnea, which may at least in part be related to deconditioning. She is on carvedilol and Imdur with standing dose of Lasix. We discussed again the concept of sliding scale Lasix which she is quite familiar with.   CAD (coronary artery disease) of bypass graft, along with subtotal left main occlusion and and RCA occlusion - graft dependent No active anginal  symptoms. She is on a good dose of Imdur and carvedilol. Because of the extensive amount of stent in her RCA she is on Brilinta but we stop the aspirin. No further bleeding. She is not on statin due to intolerance but is on Zetia.   Essential hypertension She is a bit more hypertensive today than she had been  In the past. She is on carvedilol. This is the first time seizure being significantly hypertensive. I would like to reassess that. On my recheck, I got 140/70 mmHg.   She says at home her values are usually in the 140-150 mm mercury systolic range. Because she has positional dizziness and poor balance I am inclined to allow for mild permissive hypertension as long as she is not having any heart failure symptoms.   Dyslipidemia with statin intolerance Statin intolerant. On Zetia and WelChol. Lipids have initially improved, but it seemed to increased over the last few years. I discussed importance of dietary modifications.   Obesity (BMI 30-39.9) Unfortunately we are limited by her significant arthritis. Dietary changes are the mainstay --> she has lost ~10 lb since July.     No orders of the defined types were placed in this encounter.   Meds ordered this encounter  Medications  . furosemide (LASIX) 40 MG tablet    Sig: Take 40 mg by mouth 2 (two) times daily.  . ticagrelor (BRILINTA) 90 MG TABS tablet    Sig: TAKE 1 TABLET (90 MG TOTAL) BY MOUTH 2 (TWO) TIMES DAILY.    Dispense:  60 tablet    Refill:  10  . carvedilol (COREG) 6.25 MG tablet    Sig: Take 1 tablet (6.25 mg total) by mouth 2 (two) times daily with a meal.    Dispense:  60 tablet    Refill:  6    Followup: 6 months   Labron Bloodgood, Piedad Climes, M.D., M.S. Interventional Cardiologist   Pager # [phone]

## 2014-08-25 ENCOUNTER — Ambulatory Visit: Payer: Self-pay

## 2014-09-04 ENCOUNTER — Ambulatory Visit: Payer: Self-pay | Admitting: Podiatrist

## 2014-09-19 ENCOUNTER — Emergency Department (HOSPITAL_COMMUNITY): Payer: Medicare Other

## 2014-09-19 ENCOUNTER — Encounter (HOSPITAL_COMMUNITY): Payer: Self-pay | Admitting: Emergency Medicine

## 2014-09-19 ENCOUNTER — Emergency Department (HOSPITAL_COMMUNITY)
Admission: EM | Admit: 2014-09-19 | Discharge: 2014-09-19 | Disposition: A | Discharge status: Home / Self Care | Payer: Medicare Other | Attending: Emergency Medicine | Admitting: Emergency Medicine

## 2014-09-19 DIAGNOSIS — I129 Hypertensive chronic kidney disease with stage 1 through stage 4 chronic kidney disease, or unspecified chronic kidney disease: Secondary | ICD-10-CM | POA: Diagnosis not present

## 2014-09-19 DIAGNOSIS — E039 Hypothyroidism, unspecified: Secondary | ICD-10-CM | POA: Diagnosis not present

## 2014-09-19 DIAGNOSIS — M109 Gout, unspecified: Secondary | ICD-10-CM | POA: Insufficient documentation

## 2014-09-19 DIAGNOSIS — Z79899 Other long term (current) drug therapy: Secondary | ICD-10-CM | POA: Insufficient documentation

## 2014-09-19 DIAGNOSIS — I252 Old myocardial infarction: Secondary | ICD-10-CM | POA: Diagnosis not present

## 2014-09-19 DIAGNOSIS — Z9889 Other specified postprocedural states: Secondary | ICD-10-CM | POA: Diagnosis not present

## 2014-09-19 DIAGNOSIS — N182 Chronic kidney disease, stage 2 (mild): Secondary | ICD-10-CM | POA: Diagnosis not present

## 2014-09-19 DIAGNOSIS — Z8701 Personal history of pneumonia (recurrent): Secondary | ICD-10-CM | POA: Diagnosis not present

## 2014-09-19 DIAGNOSIS — Z9861 Coronary angioplasty status: Secondary | ICD-10-CM | POA: Insufficient documentation

## 2014-09-19 DIAGNOSIS — R4701 Aphasia: Secondary | ICD-10-CM | POA: Diagnosis not present

## 2014-09-19 DIAGNOSIS — F419 Anxiety disorder, unspecified: Secondary | ICD-10-CM | POA: Insufficient documentation

## 2014-09-19 DIAGNOSIS — I2510 Atherosclerotic heart disease of native coronary artery without angina pectoris: Secondary | ICD-10-CM | POA: Diagnosis not present

## 2014-09-19 DIAGNOSIS — R479 Unspecified speech disturbances: Secondary | ICD-10-CM

## 2014-09-19 DIAGNOSIS — Z951 Presence of aortocoronary bypass graft: Secondary | ICD-10-CM | POA: Insufficient documentation

## 2014-09-19 DIAGNOSIS — E785 Hyperlipidemia, unspecified: Secondary | ICD-10-CM | POA: Insufficient documentation

## 2014-09-19 DIAGNOSIS — I4891 Unspecified atrial fibrillation: Secondary | ICD-10-CM | POA: Insufficient documentation

## 2014-09-19 DIAGNOSIS — R4781 Slurred speech: Secondary | ICD-10-CM | POA: Diagnosis not present

## 2014-09-19 DIAGNOSIS — M6281 Muscle weakness (generalized): Secondary | ICD-10-CM | POA: Diagnosis present

## 2014-09-19 DIAGNOSIS — R4789 Other speech disturbances: Secondary | ICD-10-CM | POA: Insufficient documentation

## 2014-09-19 DIAGNOSIS — R001 Bradycardia, unspecified: Secondary | ICD-10-CM | POA: Diagnosis not present

## 2014-09-19 DIAGNOSIS — R404 Transient alteration of awareness: Secondary | ICD-10-CM | POA: Diagnosis not present

## 2014-09-19 DIAGNOSIS — I639 Cerebral infarction, unspecified: Secondary | ICD-10-CM

## 2014-09-19 DIAGNOSIS — R531 Weakness: Secondary | ICD-10-CM | POA: Diagnosis not present

## 2014-09-19 LAB — DIFFERENTIAL
BASOS ABS: 0 10*3/uL (ref 0.0–0.1)
Basophils Relative: 1 % (ref 0–1)
Eosinophils Absolute: 0 10*3/uL (ref 0.0–0.7)
Eosinophils Relative: 1 % (ref 0–5)
Lymphocytes Relative: 30 % (ref 12–46)
Lymphs Abs: 1.2 10*3/uL (ref 0.7–4.0)
MONO ABS: 0.4 10*3/uL (ref 0.1–1.0)
Monocytes Relative: 10 % (ref 3–12)
NEUTROS PCT: 58 % (ref 43–77)
Neutro Abs: 2.3 10*3/uL (ref 1.7–7.7)

## 2014-09-19 LAB — RAPID URINE DRUG SCREEN, HOSP PERFORMED
AMPHETAMINES: NOT DETECTED
BENZODIAZEPINES: NOT DETECTED
Barbiturates: NOT DETECTED
Cocaine: NOT DETECTED
OPIATES: NOT DETECTED
Tetrahydrocannabinol: NOT DETECTED

## 2014-09-19 LAB — COMPREHENSIVE METABOLIC PANEL
ALK PHOS: 135 U/L — AB (ref 39–117)
ALT: 16 U/L (ref 0–35)
AST: 27 U/L (ref 0–37)
Albumin: 3.3 g/dL — ABNORMAL LOW (ref 3.5–5.2)
Anion gap: 8 (ref 5–15)
BILIRUBIN TOTAL: 0.4 mg/dL (ref 0.3–1.2)
BUN: 52 mg/dL — ABNORMAL HIGH (ref 6–23)
CHLORIDE: 108 mmol/L (ref 96–112)
CO2: 26 mmol/L (ref 19–32)
Calcium: 9.4 mg/dL (ref 8.4–10.5)
Creatinine, Ser: 2.09 mg/dL — ABNORMAL HIGH (ref 0.50–1.10)
GFR calc Af Amer: 23 mL/min — ABNORMAL LOW (ref >90)
GFR, EST NON AFRICAN AMERICAN: 20 mL/min — AB (ref >90)
Glucose, Bld: 114 mg/dL — ABNORMAL HIGH (ref 70–99)
Potassium: 4.5 mmol/L (ref 3.5–5.1)
SODIUM: 142 mmol/L (ref 135–145)
Total Protein: 6.2 g/dL (ref 6.0–8.3)

## 2014-09-19 LAB — URINE MICROSCOPIC-ADD ON

## 2014-09-19 LAB — CBC
HCT: 33.6 % — ABNORMAL LOW (ref 36.0–46.0)
Hemoglobin: 10.7 g/dL — ABNORMAL LOW (ref 12.0–15.0)
MCH: 25.7 pg — AB (ref 26.0–34.0)
MCHC: 31.8 g/dL (ref 30.0–36.0)
MCV: 80.8 fL (ref 78.0–100.0)
PLATELETS: 129 10*3/uL — AB (ref 150–400)
RBC: 4.16 MIL/uL (ref 3.87–5.11)
RDW: 21.3 % — ABNORMAL HIGH (ref 11.5–15.5)
WBC: 3.9 10*3/uL — ABNORMAL LOW (ref 4.0–10.5)

## 2014-09-19 LAB — URINALYSIS, ROUTINE W REFLEX MICROSCOPIC
Bilirubin Urine: NEGATIVE
Glucose, UA: NEGATIVE mg/dL
Hgb urine dipstick: NEGATIVE
Ketones, ur: NEGATIVE mg/dL
NITRITE: NEGATIVE
Protein, ur: NEGATIVE mg/dL
Specific Gravity, Urine: 1.008 (ref 1.005–1.030)
Urobilinogen, UA: 0.2 mg/dL (ref 0.0–1.0)
pH: 6.5 (ref 5.0–8.0)

## 2014-09-19 LAB — PROTIME-INR
INR: 1.09 (ref 0.00–1.49)
PROTHROMBIN TIME: 14.3 s (ref 11.6–15.2)

## 2014-09-19 LAB — I-STAT CHEM 8, ED
BUN: 58 mg/dL — ABNORMAL HIGH (ref 6–23)
CHLORIDE: 113 mmol/L — AB (ref 96–112)
Calcium, Ion: 1.25 mmol/L (ref 1.13–1.30)
Creatinine, Ser: 2.1 mg/dL — ABNORMAL HIGH (ref 0.50–1.10)
Glucose, Bld: 111 mg/dL — ABNORMAL HIGH (ref 70–99)
HCT: 38 % (ref 36.0–46.0)
HEMOGLOBIN: 12.9 g/dL (ref 12.0–15.0)
POTASSIUM: 4.5 mmol/L (ref 3.5–5.1)
Sodium: 143 mmol/L (ref 135–145)
TCO2: 22 mmol/L (ref 0–100)

## 2014-09-19 LAB — I-STAT TROPONIN, ED: Troponin i, poc: 0.01 ng/mL (ref 0.00–0.08)

## 2014-09-19 LAB — APTT: aPTT: 39 seconds — ABNORMAL HIGH (ref 24–37)

## 2014-09-19 NOTE — ED Notes (Signed)
Spoke with patient and family regarding MRI. Patient anxious. Exam explained. Patient agreeable.

## 2014-09-19 NOTE — ED Notes (Signed)
Patient is coming from home. EMS states [facility] called out, patient refused to come to ER. Spoke with family and decided to come. Patient has gernalized weakness and " difficulty getting words out". Patient denies any pain on arrivial. Patient alert & O x4

## 2014-09-19 NOTE — Discharge Instructions (Signed)
Possible early Dementia Dementia is a general term for problems with brain function. A person with dementia has memory loss and a hard time with at least one other brain function such as thinking, speaking, or problem solving. Dementia can affect social functioning, how you do your job, your mood, or your personality. The changes may be hidden for a long time. The earliest forms of this disease are usually not detected by family or friends. Dementia can be:  Irreversible.  Potentially reversible.  Partially reversible.  Progressive. This means it can get worse over time. CAUSES  Irreversible dementia causes may include:  Degeneration of brain cells (Alzheimer disease or Lewy body dementia).  Multiple small strokes (vascular dementia).  Infection (chronic meningitis or Creutzfeldt-Jakob disease).  Frontotemporal dementia. This affects younger people, age 76 to 63, compared to those who have Alzheimer disease.  Dementia associated with other disorders like Parkinson disease, Huntington disease, or HIV-associated dementia. Potentially or partially reversible dementia causes may include:  Medicines.  Metabolic causes such as excessive alcohol intake, vitamin B12 deficiency, or thyroid disease.  Masses or pressure in the brain such as a tumor, blood clot, or hydrocephalus. SIGNS AND SYMPTOMS  Symptoms are often hard to detect. Family members or coworkers may not notice them early in the disease process. Different people with dementia may have different symptoms. Symptoms can include:  A hard time with memory, especially recent memory. Long-term memory may not be impaired.  Asking the same question multiple times or forgetting something someone just said.  A hard time speaking your thoughts or finding certain words.  A hard time solving problems or performing familiar tasks (such as how to use a telephone).  Sudden changes in mood.  Changes in personality, especially increasing  moodiness or mistrust.  Depression.  A hard time understanding complex ideas that were never a problem in the past. DIAGNOSIS  There are no specific tests for dementia.   Your health care provider may recommend a thorough evaluation. This is because some forms of dementia can be reversible. The evaluation will likely include a physical exam and getting a detailed history from you and a family member. The history often gives the best clues and suggestions for a diagnosis.  Memory testing may be done. A detailed brain function evaluation called neuropsychologic testing may be helpful.  Lab tests and brain imaging (such as a CT scan or MRI scan) are sometimes important.  Sometimes observation and re-evaluation over time is very helpful. TREATMENT  Treatment depends on the cause.   If the problem is a vitamin deficiency, it may be helped or cured with supplements.  For dementias such as Alzheimer disease, medicines are available to stabilize or slow the course of the disease. There are no cures for this type of dementia.  Your health care provider can help direct you to groups, organizations, and other health care providers to help with decisions in the care of you or your loved one. HOME CARE INSTRUCTIONS The care of individuals with dementia is varied and dependent upon the progression of the dementia. The following suggestions are intended for the person living with, or caring for, the person with dementia.  Create a safe environment.  Remove the locks on bathroom doors to prevent the person from accidentally locking himself or herself in.  Use childproof latches on kitchen cabinets and any place where cleaning supplies, chemicals, or alcohol are kept.  Use childproof covers in unused electrical outlets.  Install childproof devices to keep doors  and windows secured.  Remove stove knobs or install safety knobs and an automatic shut-off on the stove.  Lower the temperature on water  heaters.  Label medicines and keep them locked up.  Secure knives, lighters, matches, power tools, and guns, and keep these items out of reach.  Keep the house free from clutter. Remove rugs or anything that might contribute to a fall.  Remove objects that might break and hurt the person.  Make sure lighting is good, both inside and outside.  Install grab rails as needed.  Use a monitoring device to alert you to falls or other needs for help.  Reduce confusion.  Keep familiar objects and people around.  Use night lights or dim lights at night.  Label items or areas.  Use reminders, notes, or directions for daily activities or tasks.  Keep a simple, consistent routine for waking, meals, bathing, dressing, and bedtime.  Create a calm, quiet environment.  Place large clocks and calendars prominently.  Display emergency numbers and home address near all telephones.  Use cues to establish different times of the day. An example is to open curtains to let the natural light in during the day.   Use effective communication.  Choose simple words and short sentences.  Use a gentle, calm tone of voice.  Be careful not to interrupt.  If the person is struggling to find a word or communicate a thought, try to provide the word or thought.  Ask one question at a time. Allow the person ample time to answer questions. Repeat the question again if the person does not respond.  Reduce nighttime restlessness.  Provide a comfortable bed.  Have a consistent nighttime routine.  Ensure a regular walking or physical activity schedule. Involve the person in daily activities as much as possible.  Limit napping during the day.  Limit caffeine.  Attend social events that stimulate rather than overwhelm the senses.  Encourage good nutrition and hydration.  Reduce distractions during meal times and snacks.  Avoid foods that are too hot or too cold.  Monitor chewing and swallowing  ability.  Continue with routine vision, hearing, dental, and medical screenings.  Give medicines only as directed by the health care provider.  Monitor driving abilities. Do not allow the person to drive when safe driving is no longer possible.  Register with an identification program which could provide location assistance in the event of a missing person situation. SEEK MEDICAL CARE IF:   New behavioral problems start such as moodiness, aggressiveness, or seeing things that are not there (hallucinations).  Any new problem with brain function happens. This includes problems with balance, speech, or falling a lot.  Problems with swallowing develop.  Any symptoms of other illness happen. Small changes or worsening in any aspect of brain function can be a sign that the illness is getting worse. It can also be a sign of another medical illness such as infection. Seeing a health care provider right away is important. SEEK IMMEDIATE MEDICAL CARE IF:   A fever develops.  New or worsened confusion develops.  New or worsened sleepiness develops.  Staying awake becomes hard to do. Document Released: 12/21/2000 Document Revised: 11/11/2013 Document Reviewed: 11/22/2010 [employer] [facility] Patient Information 2015 [place], [place]. This information is not intended to replace advice given to you by your health care provider. Make sure you discuss any questions you have with your health care provider.

## 2014-09-19 NOTE — ED Provider Notes (Addendum)
CSN: [id number]     Arrival date & time 09/19/14  1224 History   First MD Initiated Contact with Patient 09/19/14 1225     Chief Complaint  Patient presents with  . Extremity Weakness     (Consider location/radiation/quality/duration/timing/severity/associated sxs/prior Treatment) HPI Comments: Patient presents to the emergency department for evaluation of intermittent difficulty with speech. EMS was reportedly called to her house twice today. The first time she declined transport, but the second time her family convinced her to come to the ER. Patient reports that intermittently she has been having difficulty getting words out. She feels like she knows what she wants to say, but cannot get words out at all, cannot speak. These symptoms seem to come and go. She is not sure what causes them. She does report that symptoms have been occurring intermittently for several weeks, but are much worse in the last couple of days. She became concerned today because her husband had a massive stroke and had similar symptoms, she herself has never had a stroke or TIA.  Patient is a 81 y.o. female presenting with extremity weakness.  Extremity Weakness    Past Medical History  Diagnosis Date  . ST elevation myocardial infarction (STEMI) of inferior wall 05/2011; 03/10/2012    a) 11/'12: 100% RCA, dLM 80% -- POBA of RCA (for planned CABG, not done until re-admission with NSTEMI 1/'13);; b) 8/'13: 100% SVG-RCA (PCI & re-PCI), native RCA 100%; Patent LIMA-p-mLAD, SVG-OM  . CAD S/P percutaneous coronary angioplasty 11/'12; 8/31 & 9/1/'13    a) MV: 100% RCA-POBA, dLM left main 80%; b) 1/'13: NSTEMI --> CABG; c) 2013: 8/31 - 100% RCA & acute SVG-RCA, 100% SVG-OM, 90% LM, 80% p&mLAD, ~70% RI, 50% Cx --> PCI-SVG-RCA: Promus Element DES x 3 (prox 2.5 mm x 38 mm & 2.5 mm x 16 mm, distal 2.5 mm x 16 mm); on 9/1 - accute in-stent Thrombosis - Aspiration thrombectomy & PTCA  . S/P CABG x 4 08/05/11    Dr. Tyrone Sage:  LIMA-p-mLAD, SVG-RPDA, SVG-OM; known  100% SVG-OM, Extensive PTCA of SVG-RCA;;  Hospital course complicated by Afib & PNA; after d/c cellulitis of SVG harvest site due to edema  . Episodic atrial fibrillation 07/2011    Post-Op CABG  . Ischemic cardiomyopathy 03/2012    Echo: EF 45-50% - Mild basal- mid inferolateral Hypokinesis: Gr 1 DD, mildly increased PAP.  Marland Kitchen Dyslipidemia, goal LDL below 70     statin intolerance (lipitor, crestor drug reaction); Welchol & Zetia  . Hypertension, essential   . DM (diabetes mellitus), type 2 with renal complications   . Chronic kidney disease (CKD) stage G3b/A1, moderately decreased glomerular filtration rate (GFR) between 30-44 mL/min/1.73 square meter and albuminuria creatinine ratio less than 30 mg/g   . History of pneumonia     post-op from CABG  . Hypothyroidism     on synthroid  . Headache(784.0)   . Osteoarthritis   . Anxiety     PRN Xanax   . Dermatophytosis of nail   . Gout     on Allopurinol  . Lower extremity edema     chronic   Past Surgical History  Procedure Laterality Date  . Cardiac catheterization  November 2012; August and September 2013    Patent LIMA-LAD, patent she had an OM, patent stented SVG-RCA; occluded native RCA, 90% ostial left main, tandem 80% proximal and mid LAD, 70% mid Circumflex  . Coronary angioplasty  November 2012    PTCA only of RCA  and setting of inferior STEMI  . Coronary artery bypass graft  08/05/2011    Procedure: CORONARY ARTERY BYPASS GRAFTING (CABG);  Surgeon: [name], MD;  Location: [facility] OR;  Service: Open Heart Surgery;  Laterality: N/A;  coronary artery bypass graft times 4 using left internal mammary artery and right leg saphenous vein harvested endoscopically  . Joint replacement      both hips replaced  . I&d extremity  09/21/2011    Procedure: IRRIGATION AND DEBRIDEMENT EXTREMITY;  Surgeon: [name], MD;  Location: [facility] OR;  Service: Vascular;  Laterality: Right;  with wound vac  placement  . Coronary angioplasty with stent placement  03/10/2012     infferior STEMI: Occluded native RCA and SVG-RCA --> thrombectomy and 3 stent placement to the SVG-RCA (2.5 mm right 38 mm and 2 proximal distal overlapping 2.5 mm x 16 mm Promus DES stents:  . Coronary angioplasty  03/11/2012    Inferior STEMI #3: Reoccluded SVG-RCA; extensive thrombectomy and post dilation PTCA  . Transthoracic echocardiogram  September 2013    EF 45-50%, mild hypokinesis of the basal and mid inferolateral wall, grade 1 diastolic dysfunction, mildly increased artery pressures.  . Left heart catheterization with coronary angiogram N/A 05/26/2011    Procedure: LEFT HEART CATHETERIZATION WITH CORONARY ANGIOGRAM;  Surgeon: Marykay Lex, MD;  Location: [facility] CATH LAB;  Service: Cardiovascular;  Laterality: N/A;  . Percutaneous coronary intervention-balloon only N/A 05/26/2011    Procedure: PERCUTANEOUS CORONARY INTERVENTION-BALLOON ONLY;  Surgeon: Marykay Lex, MD;  Location: [facility] CATH LAB;  Service: Cardiovascular;  Laterality: N/A;  . Left heart cath N/A 03/10/2012    Procedure: LEFT HEART CATH;  Surgeon: Herby Abraham, MD;  Location: [facility] CATH LAB;  Service: Cardiovascular;  Laterality: N/A;  . Percutaneous coronary stent intervention (pci-s)  03/10/2012    Procedure: PERCUTANEOUS CORONARY STENT INTERVENTION (PCI-S);  Surgeon: Herby Abraham, MD;  Location: [facility] CATH LAB;  Service: Cardiovascular;;  . Left heart catheterization with coronary angiogram Bilateral 03/11/2012    Procedure: LEFT HEART CATHETERIZATION WITH CORONARY ANGIOGRAM;  Surgeon: Thurmon Fair, MD;  Location: [facility] CATH LAB;  Service: Cardiovascular;  Laterality: Bilateral;   Family History  Problem Relation Age of Onset  . Breast cancer Mother   . Heart disease Father    History  Substance Use Topics  . Smoking status: Never Smoker   . Smokeless tobacco: Never Used  . Alcohol Use: 0.6 oz/week    1 Glasses of wine per week   OB History     No data available     Review of Systems  Musculoskeletal: Positive for extremity weakness.  Neurological: Positive for dizziness.  All other systems reviewed and are negative.     Allergies  Tape and Statins  Home Medications   Prior to Admission medications   Medication Sig Start Date End Date Taking? Authorizing Provider  acetaminophen (TYLENOL) 325 MG tablet Take 650 mg by mouth 2 (two) times daily.    Historical Provider, MD  allopurinol (ZYLOPRIM) 100 MG tablet Take 100 mg by mouth daily.     Historical Provider, MD  carvedilol (COREG) 6.25 MG tablet Take 1 tablet (6.25 mg total) by mouth 2 (two) times daily with a meal. 08/15/14   Marykay Lex, MD  colesevelam [employer]) 625 MG tablet Take 3 tablets (1,875 mg total) by mouth 2 (two) times daily with a meal. 08/15/11 02/03/14  Dwana Melena, PA-C  diazepam (VALIUM) 5 MG tablet Take 0.5 tablets (  2.5 mg total) by mouth daily. 04/12/14   Oswaldo Conroy, PA-C  ezetimibe (ZETIA) 10 MG tablet Take 1 tablet (10 mg total) by mouth daily. 08/15/11 02/03/14  Dwana Melena, PA-C  furosemide (LASIX) 40 MG tablet Take 40 mg by mouth 2 (two) times daily.    Historical Provider, MD  isosorbide mononitrate (IMDUR) 30 MG 24 hr tablet Take 1 tablet (30 mg total) by mouth daily. 03/15/12 02/03/14  Abelino Derrick, PA-C  levothyroxine (SYNTHROID, LEVOTHROID) 100 MCG tablet Take 100 mcg by mouth daily.     Historical Provider, MD  LORazepam (ATIVAN) 0.5 MG tablet 0.5 mg daily.  01/16/13   Historical Provider, MD  pantoprazole (PROTONIX) 40 MG tablet Take 1 tablet (40 mg total) by mouth daily at 12 noon. 06/01/11 02/03/14  Abelino Derrick, PA-C  sodium bicarbonate 650 MG tablet Take 650 mg by mouth daily.      Historical Provider, MD  ticagrelor (BRILINTA) 90 MG TABS tablet TAKE 1 TABLET (90 MG TOTAL) BY MOUTH 2 (TWO) TIMES DAILY. 08/15/14   Marykay Lex, MD   BP 134/71 mmHg  Pulse 57  Resp 18  Ht 5\' 1"  (1.549 m)  Wt 160 lb (72.576 kg)  BMI 30.25 kg/m2   SpO2 97% Physical Exam  Constitutional: She is oriented to person, place, and time. She appears well-developed and well-nourished. No distress.  HENT:  Head: Normocephalic and atraumatic.  Right Ear: Hearing normal.  Left Ear: Hearing normal.  Nose: Nose normal.  Mouth/Throat: Oropharynx is clear and moist and mucous membranes are normal.  Eyes: Conjunctivae and EOM are normal. Pupils are equal, round, and reactive to light.  Neck: Normal range of motion. Neck supple.  Cardiovascular: Regular rhythm, S1 normal and S2 normal.  Exam reveals no gallop and no friction rub.   No murmur heard. Pulmonary/Chest: Effort normal and breath sounds normal. No respiratory distress. She exhibits no tenderness.  Abdominal: Soft. Normal appearance and bowel sounds are normal. There is no hepatosplenomegaly. There is no tenderness. There is no rebound, no guarding, no tenderness at McBurney's point and negative Murphy's sign. No hernia.  Musculoskeletal: Normal range of motion.  Neurological: She is alert and oriented to person, place, and time. She has normal strength. No cranial nerve deficit or sensory deficit. Coordination normal. GCS eye subscore is 4. GCS verbal subscore is 5. GCS motor subscore is 6.  Intermittently has difficulty finding words  Skin: Skin is warm, dry and intact. No rash noted. No cyanosis.  Psychiatric: She has a normal mood and affect. Her speech is normal and behavior is normal. Thought content normal.  Nursing note and vitals reviewed.   ED Course  Procedures (including critical care time) Labs Review Labs Reviewed  APTT - Abnormal; Notable for the following:    aPTT 39 (*)    All other components within normal limits  CBC - Abnormal; Notable for the following:    WBC 3.9 (*)    Hemoglobin 10.7 (*)    HCT 33.6 (*)    MCH 25.7 (*)    RDW 21.3 (*)    Platelets 129 (*)    All other components within normal limits  COMPREHENSIVE METABOLIC PANEL - Abnormal; Notable for the  following:    Glucose, Bld 114 (*)    BUN 52 (*)    Creatinine, Ser 2.09 (*)    Albumin 3.3 (*)    Alkaline Phosphatase 135 (*)    GFR calc non Af Amer 20 (*)  GFR calc Af Amer 23 (*)    All other components within normal limits  URINALYSIS, ROUTINE W REFLEX MICROSCOPIC - Abnormal; Notable for the following:    APPearance HAZY (*)    Leukocytes, UA SMALL (*)    All other components within normal limits  URINE MICROSCOPIC-ADD ON - Abnormal; Notable for the following:    Squamous Epithelial / LPF FEW (*)    Bacteria, UA FEW (*)    All other components within normal limits  I-STAT CHEM 8, ED - Abnormal; Notable for the following:    Chloride 113 (*)    BUN 58 (*)    Creatinine, Ser 2.10 (*)    Glucose, Bld 111 (*)    All other components within normal limits  URINE CULTURE  PROTIME-INR  DIFFERENTIAL  URINE RAPID DRUG SCREEN (HOSP PERFORMED)  ETHANOL  I-STAT TROPOININ, ED  I-STAT TROPOININ, ED    Imaging Review Ct Head Wo Contrast  09/19/2014   CLINICAL DATA:  Generalized weakness and difficulty with speech  EXAM: CT HEAD WITHOUT CONTRAST  TECHNIQUE: Contiguous axial images were obtained from the base of the skull through the vertex without intravenous contrast.  COMPARISON:  04/12/2014  FINDINGS: The bony calvarium is intact. Mild atrophic changes are noted. No findings to suggest acute hemorrhage, acute infarction or space-occupying mass lesion are noted.  IMPRESSION: Mild atrophic changes without acute abnormality.   Electronically Signed   By: Alcide Clever M.D.   On: 09/19/2014 14:33     EKG Interpretation   Date/Time:  Friday September 19 2014 12:35:49 EST Ventricular Rate:  50 PR Interval:  277 QRS Duration: 135 QT Interval:  576 QTC Calculation: 525 R Axis:   0 Text Interpretation:  Sinus bradycardia Prolonged PR interval Consider  left atrial enlargement Nonspecific intraventricular conduction delay  Inferior infarct, old Baseline wander in lead(s) II No significant  change  since last tracing Confirmed by Jahden Schara  MD, Ruairi Stutsman [phone]) on  09/19/2014 2:53:32 PM      MDM   Final diagnoses:  None   speech difficulty  Patient presents to the ER with complaints of difficulty with speech. She has been having intermittent episodes of having trouble finding words over the past several weeks. She does have a pattern of speech. The ER where she has difficulty finding her words. It's not clearly expressive aphasia, however. Her workup included CT scan and lab work. No acute abnormality noted. Neurology consult appreciated. Will obtain MRI. If negative patient appropriate for discharge. If positive the patient for stroke workup. Will be signed out to oncoming ER physician to disposition.    Gilda Crease, MD 09/19/14 1711  Gilda Crease, MD 09/19/14 [phone]

## 2014-09-19 NOTE — Consult Note (Signed)
Referring Physician: Pollina    Chief Complaint: word finding difficulty  HPI:                                                                                                                                         [name] is an 81 y.o. female who lives with her husband and uses a cane and walker at baseline for LE weakness. She was brought to ED due to generalized weakness and difficulty with word finding over the past 3+ weeks.  She knows what she would like to say but at times just cannot get the word (singular) out. She states the word finding difficulty is intermittent and not continuous.  She denies any other neurological symptoms, LOS, seizure history or change in medications.   Date last known well: Unable to determine Time last known well: Unable to determine tPA Given: No: no LSN Modified Rankin: Rankin Score=0    Past Medical History  Diagnosis Date  . ST elevation myocardial infarction (STEMI) of inferior wall 05/2011; 03/10/2012    a) 11/'12: 100% RCA, dLM 80% -- POBA of RCA (for planned CABG, not done until re-admission with NSTEMI 1/'13);; b) 8/'13: 100% SVG-RCA (PCI & re-PCI), native RCA 100%; Patent LIMA-p-mLAD, SVG-OM  . CAD S/P percutaneous coronary angioplasty 11/'12; 8/31 & 9/1/'13    a) MV: 100% RCA-POBA, dLM left main 80%; b) 1/'13: NSTEMI --> CABG; c) 2013: 8/31 - 100% RCA & acute SVG-RCA, 100% SVG-OM, 90% LM, 80% p&mLAD, ~70% RI, 50% Cx --> PCI-SVG-RCA: Promus Element DES x 3 (prox 2.5 mm x 38 mm & 2.5 mm x 16 mm, distal 2.5 mm x 16 mm); on 9/1 - accute in-stent Thrombosis - Aspiration thrombectomy & PTCA  . S/P CABG x 4 08/05/11    Dr. Tyrone Sage: LIMA-p-mLAD, SVG-RPDA, SVG-OM; known  100% SVG-OM, Extensive PTCA of SVG-RCA;;  Hospital course complicated by Afib & PNA; after d/c cellulitis of SVG harvest site due to edema  . Episodic atrial fibrillation 07/2011    Post-Op CABG  . Ischemic cardiomyopathy 03/2012    Echo: EF 45-50% - Mild basal- mid inferolateral  Hypokinesis: Gr 1 DD, mildly increased PAP.  Marland Kitchen Dyslipidemia, goal LDL below 70     statin intolerance (lipitor, crestor drug reaction); Welchol & Zetia  . Hypertension, essential   . DM (diabetes mellitus), type 2 with renal complications   . Chronic kidney disease (CKD) stage G3b/A1, moderately decreased glomerular filtration rate (GFR) between 30-44 mL/min/1.73 square meter and albuminuria creatinine ratio less than 30 mg/g   . History of pneumonia     post-op from CABG  . Hypothyroidism     on synthroid  . Headache(784.0)   . Osteoarthritis   . Anxiety     PRN Xanax   . Dermatophytosis of nail   . Gout     on Allopurinol  . Lower extremity edema  chronic    Past Surgical History  Procedure Laterality Date  . Cardiac catheterization  November 2012; August and September 2013    Patent LIMA-LAD, patent she had an OM, patent stented SVG-RCA; occluded native RCA, 90% ostial left main, tandem 80% proximal and mid LAD, 70% mid Circumflex  . Coronary angioplasty  November 2012    PTCA only of RCA and setting of inferior STEMI  . Coronary artery bypass graft  08/05/2011    Procedure: CORONARY ARTERY BYPASS GRAFTING (CABG);  Surgeon: [name], MD;  Location: [facility] OR;  Service: Open Heart Surgery;  Laterality: N/A;  coronary artery bypass graft times 4 using left internal mammary artery and right leg saphenous vein harvested endoscopically  . Joint replacement      both hips replaced  . I&d extremity  09/21/2011    Procedure: IRRIGATION AND DEBRIDEMENT EXTREMITY;  Surgeon: [name], MD;  Location: [facility] OR;  Service: Vascular;  Laterality: Right;  with wound vac placement  . Coronary angioplasty with stent placement  03/10/2012     infferior STEMI: Occluded native RCA and SVG-RCA --> thrombectomy and 3 stent placement to the SVG-RCA (2.5 mm right 38 mm and 2 proximal distal overlapping 2.5 mm x 16 mm Promus DES stents:  . Coronary angioplasty  03/11/2012    Inferior STEMI  #3: Reoccluded SVG-RCA; extensive thrombectomy and post dilation PTCA  . Transthoracic echocardiogram  September 2013    EF 45-50%, mild hypokinesis of the basal and mid inferolateral wall, grade 1 diastolic dysfunction, mildly increased artery pressures.  . Left heart catheterization with coronary angiogram N/A 05/26/2011    Procedure: LEFT HEART CATHETERIZATION WITH CORONARY ANGIOGRAM;  Surgeon: Marykay Lex, MD;  Location: [facility] CATH LAB;  Service: Cardiovascular;  Laterality: N/A;  . Percutaneous coronary intervention-balloon only N/A 05/26/2011    Procedure: PERCUTANEOUS CORONARY INTERVENTION-BALLOON ONLY;  Surgeon: Marykay Lex, MD;  Location: [facility] CATH LAB;  Service: Cardiovascular;  Laterality: N/A;  . Left heart cath N/A 03/10/2012    Procedure: LEFT HEART CATH;  Surgeon: Herby Abraham, MD;  Location: [facility] CATH LAB;  Service: Cardiovascular;  Laterality: N/A;  . Percutaneous coronary stent intervention (pci-s)  03/10/2012    Procedure: PERCUTANEOUS CORONARY STENT INTERVENTION (PCI-S);  Surgeon: Herby Abraham, MD;  Location: [facility] CATH LAB;  Service: Cardiovascular;;  . Left heart catheterization with coronary angiogram Bilateral 03/11/2012    Procedure: LEFT HEART CATHETERIZATION WITH CORONARY ANGIOGRAM;  Surgeon: Thurmon Fair, MD;  Location: [facility] CATH LAB;  Service: Cardiovascular;  Laterality: Bilateral;    Family History  Problem Relation Age of Onset  . Breast cancer Mother   . Heart disease Father    Social History:  reports that she has never smoked. She has never used smokeless tobacco. She reports that she drinks about 0.6 oz of alcohol per week. She reports that she does not use illicit drugs. Family history: no epilepsy, brain tumors, or brain aneurysms. Allergies:  Allergies  Allergen Reactions  . Tape     Use paper tape.  . Statins Rash    Medications:  No current facility-administered medications for this encounter.   Current Outpatient Prescriptions  Medication Sig Dispense Refill  . acetaminophen (TYLENOL) 325 MG tablet Take 650 mg by mouth 2 (two) times daily.    Marland Kitchen allopurinol (ZYLOPRIM) 100 MG tablet Take 100 mg by mouth daily.     . carvedilol (COREG) 6.25 MG tablet Take 1 tablet (6.25 mg total) by mouth 2 (two) times daily with a meal. 60 tablet 6  . colesevelam (WELCHOL) 625 MG tablet Take 3 tablets (1,875 mg total) by mouth 2 (two) times daily with a meal. 180 tablet 5  . diazepam (VALIUM) 5 MG tablet Take 0.5 tablets (2.5 mg total) by mouth daily. 5 tablet 0  . ezetimibe (ZETIA) 10 MG tablet Take 1 tablet (10 mg total) by mouth daily. 30 tablet 5  . furosemide (LASIX) 40 MG tablet Take 40 mg by mouth 2 (two) times daily.    . isosorbide mononitrate (IMDUR) 30 MG 24 hr tablet Take 1 tablet (30 mg total) by mouth daily. 30 tablet 11  . levothyroxine (SYNTHROID, LEVOTHROID) 100 MCG tablet Take 100 mcg by mouth daily.     Marland Kitchen LORazepam (ATIVAN) 0.5 MG tablet 0.5 mg daily.     . pantoprazole (PROTONIX) 40 MG tablet Take 1 tablet (40 mg total) by mouth daily at 12 noon. 30 tablet 5  . sodium bicarbonate 650 MG tablet Take 650 mg by mouth daily.      . ticagrelor (BRILINTA) 90 MG TABS tablet TAKE 1 TABLET (90 MG TOTAL) BY MOUTH 2 (TWO) TIMES DAILY. 60 tablet 10     ROS:                                                                                                                                       History obtained from the patient  General ROS: negative for - chills, fatigue, fever, night sweats, weight gain or weight loss Psychological ROS: negative for - behavioral disorder, hallucinations, memory difficulties, mood swings or suicidal ideation Ophthalmic ROS: negative for - blurry vision, double vision, eye pain or loss of vision ENT ROS: negative for - epistaxis, nasal discharge, oral lesions, sore throat, tinnitus or  vertigo Allergy and Immunology ROS: negative for - hives or itchy/watery eyes Hematological and Lymphatic ROS: negative for - bleeding problems, bruising or swollen lymph nodes Endocrine ROS: negative for - galactorrhea, hair pattern changes, polydipsia/polyuria or temperature intolerance Respiratory ROS: negative for - cough, hemoptysis, shortness of breath or wheezing Cardiovascular ROS: negative for - chest pain, dyspnea on exertion, edema or irregular heartbeat Gastrointestinal ROS: negative for - abdominal pain, diarrhea, hematemesis, nausea/vomiting or stool incontinence Genito-Urinary ROS: negative for - dysuria, hematuria, incontinence or urinary frequency/urgency Musculoskeletal ROS: negative for - joint swelling or muscular weakness Neurological ROS: as noted in HPI Dermatological ROS: negative for rash and skin lesion changes  Neurologic Examination:  Blood pressure 134/71, pulse 57, resp. rate 18, height 5\' 1"  (1.549 m), weight 72.576 kg (160 lb), SpO2 97 %.  HEENT-  Normocephalic, no lesions, without obvious abnormality.  Normal external eye and conjunctiva.  Normal TM's bilaterally.  Normal auditory canals and external ears. Normal external nose, mucus membranes and septum.  Normal pharynx. Cardiovascular- normal apical impulse, pulses palpable throughout   Lungs- chest clear, no wheezing, rales, normal symmetric air entry Abdomen- normal findings: bowel sounds normal Extremities- no edema Lymph-no adenopathy palpable Musculoskeletal-no joint tenderness, deformity or swelling Skin-warm and dry, no hyperpigmentation, vitiligo, or suspicious lesions  Neurological Examination Mental Status: Alert, oriented, thought content appropriate.  Speech fluent without evidence of aphasia but at times has to think about the word she would like to use. She is able to name objects, repeat  words, speech is fluent and able to follow verbal commands.   Able to follow 3 step commands without difficulty. Cranial Nerves: II: Discs flat bilaterally; Visual fields grossly normal, pupils equal, round, reactive to light and accommodation [name],IV, VI: ptosis not present, extra-ocular motions intact bilaterally--at rest left eye is exotropic V,VII: smile symmetric, facial light touch sensation normal bilaterally VIII: hearing normal bilaterally IX,X: uvula rises symmetrically XI: bilateral shoulder shrug XII: midline tongue extension Motor: Right : Upper extremity   5/5    Left:     Upper extremity   5/5  Lower extremity   3/5     Lower extremity   3/5 (baseline) Tone and bulk:normal tone throughout; no atrophy noted Sensory: Pinprick and light touch intact throughout, bilaterally Deep Tendon Reflexes: 1+ and symmetric throughout Plantars: Mute bilaterally Cerebellar: normal finger-to-nose,   Gait: not tested due to safety       Lab Results: Basic Metabolic Panel:  Recent Labs Lab 09/19/14 1230 09/19/14 1311  NA 142 143  K 4.5 4.5  CL 108 113*  CO2 26  --   GLUCOSE 114* 111*  BUN 52* 58*  CREATININE 2.09* 2.10*  CALCIUM 9.4  --     Liver Function Tests:  Recent Labs Lab 09/19/14 1230  AST 27  ALT 16  ALKPHOS 135*  BILITOT 0.4  PROT 6.2  ALBUMIN 3.3*   No results for input(s): LIPASE, AMYLASE in the last 168 hours. No results for input(s): AMMONIA in the last 168 hours.  CBC:  Recent Labs Lab 09/19/14 1230 09/19/14 1311  WBC 3.9*  --   NEUTROABS 2.3  --   HGB 10.7* 12.9  HCT 33.6* 38.0  MCV 80.8  --   PLT 129*  --     Cardiac Enzymes: No results for input(s): CKTOTAL, CKMB, CKMBINDEX, TROPONINI in the last 168 hours.  Lipid Panel: No results for input(s): CHOL, TRIG, HDL, CHOLHDL, VLDL, LDLCALC in the last 168 hours.  CBG: No results for input(s): GLUCAP in the last 168 hours.  Microbiology: Results for orders placed or performed  during the hospital encounter of 03/10/12  MRSA PCR Screening     Status: Abnormal   Collection Time: 03/10/12 11:47 PM  Result Value Ref Range Status   MRSA by PCR POSITIVE (A) NEGATIVE Final    Comment:        The GeneXpert MRSA Assay (FDA approved for NASAL specimens only), is one component of a comprehensive MRSA colonization surveillance program. It is not intended to diagnose MRSA infection nor to guide or monitor treatment for MRSA infections. RESULT CALLED TO, READ BACK BY AND VERIFIED WITH: ROBERTS,J RN 0230 03/11/12 MITCHELL,L  Coagulation Studies:  Recent Labs  09/19/14 1230  LABPROT 14.3  INR 1.09    Imaging: Ct Head Wo Contrast  09/19/2014   CLINICAL DATA:  Generalized weakness and difficulty with speech  EXAM: CT HEAD WITHOUT CONTRAST  TECHNIQUE: Contiguous axial images were obtained from the base of the skull through the vertex without intravenous contrast.  COMPARISON:  04/12/2014  FINDINGS: The bony calvarium is intact. Mild atrophic changes are noted. No findings to suggest acute hemorrhage, acute infarction or space-occupying mass lesion are noted.  IMPRESSION: Mild atrophic changes without acute abnormality.   Electronically Signed   By: Alcide Clever M.D.   On: 09/19/2014 14:33       Assessment and plan discussed with with attending physician and they are in agreement.    Felicie Morn PA-C [facility] [phone]  09/19/2014, 3:23 PM   Assessment: 81 y.o. female presenting to ED with LE weakness and intermittent word finding difficulty for the past 3+ weeks.  Exam shows fluid speech with mild word finding difficulty but quickly finds a alternative word. Otherwise non-focal. Differential includes mild cognitive decline leading to word finding difficulty versus CVA.   Stroke Risk Factors - atrial fibrillation, diabetes mellitus, hyperlipidemia and hypertension   Recommend: 1) MRI brain--if negative no further stroke evaluation needed 2) If  MRI is positive for stroke--would admit and complete stroke workup including A1c, LDL, Echo. With given patients age and cardiac history she would not be a candidate for CEA and would not need Carotid doppler.  3) ASA daily  Patient seen and examined together with physician assistant and I concur with the assessment and plan.  Wyatt Portela, MD

## 2014-09-21 LAB — URINE CULTURE

## 2014-09-24 DIAGNOSIS — E032 Hypothyroidism due to medicaments and other exogenous substances: Secondary | ICD-10-CM | POA: Diagnosis not present

## 2014-09-24 DIAGNOSIS — I2510 Atherosclerotic heart disease of native coronary artery without angina pectoris: Secondary | ICD-10-CM | POA: Diagnosis not present

## 2014-09-24 DIAGNOSIS — E789 Disorder of lipoprotein metabolism, unspecified: Secondary | ICD-10-CM | POA: Diagnosis not present

## 2014-10-20 DIAGNOSIS — E118 Type 2 diabetes mellitus with unspecified complications: Secondary | ICD-10-CM | POA: Diagnosis not present

## 2014-10-20 DIAGNOSIS — E0789 Other specified disorders of thyroid: Secondary | ICD-10-CM | POA: Diagnosis not present

## 2014-10-20 DIAGNOSIS — E789 Disorder of lipoprotein metabolism, unspecified: Secondary | ICD-10-CM | POA: Diagnosis not present

## 2014-10-27 DIAGNOSIS — E789 Disorder of lipoprotein metabolism, unspecified: Secondary | ICD-10-CM | POA: Diagnosis not present

## 2014-10-27 DIAGNOSIS — M25561 Pain in right knee: Secondary | ICD-10-CM | POA: Diagnosis not present

## 2014-10-27 DIAGNOSIS — I10 Essential (primary) hypertension: Secondary | ICD-10-CM | POA: Diagnosis not present

## 2014-11-10 ENCOUNTER — Encounter: Payer: Self-pay | Admitting: Podiatry

## 2014-11-10 ENCOUNTER — Ambulatory Visit (INDEPENDENT_AMBULATORY_CARE_PROVIDER_SITE_OTHER): Payer: Medicare Other | Admitting: Podiatry

## 2014-11-10 DIAGNOSIS — B351 Tinea unguium: Secondary | ICD-10-CM

## 2014-11-10 DIAGNOSIS — M79676 Pain in unspecified toe(s): Secondary | ICD-10-CM | POA: Diagnosis not present

## 2014-11-11 NOTE — Progress Notes (Signed)
Patient ID: [name], female   DOB: 11/22/1920, 81 y.o.   MRN: [id number]  Subjective: This patient presents today complaining of painful toenails and requests toenail debridement  Objective: The toenails are elongated, brittle, incurvated, discolored and tender direct palpation 6-10  Assessment: Symptomatic onychomycoses 6-10  Plan: Debridement toenails 10 without any bleeding  Reappoint 3 months

## 2014-11-17 ENCOUNTER — Telehealth: Payer: Self-pay | Admitting: Cardiology

## 2014-11-21 NOTE — Telephone Encounter (Signed)
Close encounter 

## 2014-11-30 ENCOUNTER — Emergency Department (HOSPITAL_COMMUNITY): Payer: Medicare Other

## 2014-11-30 ENCOUNTER — Emergency Department (HOSPITAL_COMMUNITY)
Admission: EM | Admit: 2014-11-30 | Discharge: 2014-11-30 | Disposition: A | Discharge status: Home / Self Care | Payer: Medicare Other | Attending: Emergency Medicine | Admitting: Emergency Medicine

## 2014-11-30 ENCOUNTER — Encounter (HOSPITAL_COMMUNITY): Payer: Self-pay | Admitting: Emergency Medicine

## 2014-11-30 DIAGNOSIS — S199XXA Unspecified injury of neck, initial encounter: Secondary | ICD-10-CM | POA: Diagnosis not present

## 2014-11-30 DIAGNOSIS — N183 Chronic kidney disease, stage 3 (moderate): Secondary | ICD-10-CM | POA: Insufficient documentation

## 2014-11-30 DIAGNOSIS — E039 Hypothyroidism, unspecified: Secondary | ICD-10-CM | POA: Insufficient documentation

## 2014-11-30 DIAGNOSIS — E785 Hyperlipidemia, unspecified: Secondary | ICD-10-CM | POA: Diagnosis not present

## 2014-11-30 DIAGNOSIS — Y998 Other external cause status: Secondary | ICD-10-CM | POA: Diagnosis not present

## 2014-11-30 DIAGNOSIS — I129 Hypertensive chronic kidney disease with stage 1 through stage 4 chronic kidney disease, or unspecified chronic kidney disease: Secondary | ICD-10-CM | POA: Insufficient documentation

## 2014-11-30 DIAGNOSIS — E1129 Type 2 diabetes mellitus with other diabetic kidney complication: Secondary | ICD-10-CM | POA: Diagnosis not present

## 2014-11-30 DIAGNOSIS — S060X0A Concussion without loss of consciousness, initial encounter: Secondary | ICD-10-CM | POA: Diagnosis not present

## 2014-11-30 DIAGNOSIS — I252 Old myocardial infarction: Secondary | ICD-10-CM | POA: Diagnosis not present

## 2014-11-30 DIAGNOSIS — F419 Anxiety disorder, unspecified: Secondary | ICD-10-CM | POA: Diagnosis not present

## 2014-11-30 DIAGNOSIS — S8002XA Contusion of left knee, initial encounter: Secondary | ICD-10-CM | POA: Diagnosis not present

## 2014-11-30 DIAGNOSIS — R079 Chest pain, unspecified: Secondary | ICD-10-CM | POA: Diagnosis not present

## 2014-11-30 DIAGNOSIS — Z951 Presence of aortocoronary bypass graft: Secondary | ICD-10-CM | POA: Diagnosis not present

## 2014-11-30 DIAGNOSIS — M109 Gout, unspecified: Secondary | ICD-10-CM | POA: Insufficient documentation

## 2014-11-30 DIAGNOSIS — S0081XA Abrasion of other part of head, initial encounter: Secondary | ICD-10-CM | POA: Insufficient documentation

## 2014-11-30 DIAGNOSIS — I2510 Atherosclerotic heart disease of native coronary artery without angina pectoris: Secondary | ICD-10-CM | POA: Diagnosis not present

## 2014-11-30 DIAGNOSIS — Z9889 Other specified postprocedural states: Secondary | ICD-10-CM | POA: Diagnosis not present

## 2014-11-30 DIAGNOSIS — Z8701 Personal history of pneumonia (recurrent): Secondary | ICD-10-CM | POA: Diagnosis not present

## 2014-11-30 DIAGNOSIS — Y9289 Other specified places as the place of occurrence of the external cause: Secondary | ICD-10-CM | POA: Diagnosis not present

## 2014-11-30 DIAGNOSIS — S3992XA Unspecified injury of lower back, initial encounter: Secondary | ICD-10-CM | POA: Insufficient documentation

## 2014-11-30 DIAGNOSIS — Z79899 Other long term (current) drug therapy: Secondary | ICD-10-CM | POA: Diagnosis not present

## 2014-11-30 DIAGNOSIS — S79912A Unspecified injury of left hip, initial encounter: Secondary | ICD-10-CM | POA: Diagnosis not present

## 2014-11-30 DIAGNOSIS — S0990XA Unspecified injury of head, initial encounter: Secondary | ICD-10-CM | POA: Diagnosis not present

## 2014-11-30 DIAGNOSIS — Z8619 Personal history of other infectious and parasitic diseases: Secondary | ICD-10-CM | POA: Insufficient documentation

## 2014-11-30 DIAGNOSIS — S7012XA Contusion of left thigh, initial encounter: Secondary | ICD-10-CM | POA: Diagnosis not present

## 2014-11-30 DIAGNOSIS — Z791 Long term (current) use of non-steroidal anti-inflammatories (NSAID): Secondary | ICD-10-CM | POA: Diagnosis not present

## 2014-11-30 DIAGNOSIS — M1990 Unspecified osteoarthritis, unspecified site: Secondary | ICD-10-CM | POA: Insufficient documentation

## 2014-11-30 DIAGNOSIS — Z9861 Coronary angioplasty status: Secondary | ICD-10-CM | POA: Insufficient documentation

## 2014-11-30 DIAGNOSIS — S299XXA Unspecified injury of thorax, initial encounter: Secondary | ICD-10-CM | POA: Diagnosis not present

## 2014-11-30 DIAGNOSIS — S3993XA Unspecified injury of pelvis, initial encounter: Secondary | ICD-10-CM | POA: Diagnosis not present

## 2014-11-30 DIAGNOSIS — M25552 Pain in left hip: Secondary | ICD-10-CM | POA: Diagnosis not present

## 2014-11-30 DIAGNOSIS — R9431 Abnormal electrocardiogram [ECG] [EKG]: Secondary | ICD-10-CM | POA: Diagnosis not present

## 2014-11-30 DIAGNOSIS — S60811A Abrasion of right wrist, initial encounter: Secondary | ICD-10-CM | POA: Diagnosis not present

## 2014-11-30 DIAGNOSIS — W19XXXA Unspecified fall, initial encounter: Secondary | ICD-10-CM

## 2014-11-30 DIAGNOSIS — M79605 Pain in left leg: Secondary | ICD-10-CM | POA: Diagnosis not present

## 2014-11-30 DIAGNOSIS — M25562 Pain in left knee: Secondary | ICD-10-CM | POA: Diagnosis not present

## 2014-11-30 DIAGNOSIS — Y9389 Activity, other specified: Secondary | ICD-10-CM | POA: Diagnosis not present

## 2014-11-30 DIAGNOSIS — S8992XA Unspecified injury of left lower leg, initial encounter: Secondary | ICD-10-CM | POA: Diagnosis not present

## 2014-11-30 DIAGNOSIS — W01198A Fall on same level from slipping, tripping and stumbling with subsequent striking against other object, initial encounter: Secondary | ICD-10-CM | POA: Diagnosis not present

## 2014-11-30 LAB — COMPREHENSIVE METABOLIC PANEL
ALBUMIN: 3 g/dL — AB (ref 3.5–5.0)
ALK PHOS: 117 U/L (ref 38–126)
ALT: 13 U/L — ABNORMAL LOW (ref 14–54)
AST: 28 U/L (ref 15–41)
Anion gap: 6 (ref 5–15)
BUN: 36 mg/dL — AB (ref 6–20)
CALCIUM: 9.6 mg/dL (ref 8.9–10.3)
CHLORIDE: 113 mmol/L — AB (ref 101–111)
CO2: 25 mmol/L (ref 22–32)
Creatinine, Ser: 1.95 mg/dL — ABNORMAL HIGH (ref 0.44–1.00)
GFR calc non Af Amer: 21 mL/min — ABNORMAL LOW (ref >60)
GFR, EST AFRICAN AMERICAN: 25 mL/min — AB (ref >60)
GLUCOSE: 121 mg/dL — AB (ref 65–99)
Potassium: 4.8 mmol/L (ref 3.5–5.1)
SODIUM: 144 mmol/L (ref 135–145)
Total Bilirubin: 0.7 mg/dL (ref 0.3–1.2)
Total Protein: 6.3 g/dL — ABNORMAL LOW (ref 6.5–8.1)

## 2014-11-30 LAB — CBC
HCT: 28.9 % — ABNORMAL LOW (ref 36.0–46.0)
HEMOGLOBIN: 9.3 g/dL — AB (ref 12.0–15.0)
MCH: 25.7 pg — AB (ref 26.0–34.0)
MCHC: 32.2 g/dL (ref 30.0–36.0)
MCV: 79.8 fL (ref 78.0–100.0)
Platelets: 140 10*3/uL — ABNORMAL LOW (ref 150–400)
RBC: 3.62 MIL/uL — ABNORMAL LOW (ref 3.87–5.11)
RDW: 22 % — ABNORMAL HIGH (ref 11.5–15.5)
WBC: 5.6 10*3/uL (ref 4.0–10.5)

## 2014-11-30 LAB — CBG MONITORING, ED: GLUCOSE-CAPILLARY: 110 mg/dL — AB (ref 65–99)

## 2014-11-30 MED ORDER — ACETAMINOPHEN 325 MG PO TABS
650.0000 mg | ORAL_TABLET | Freq: Once | ORAL | Status: AC
  Administered 2014-11-30: 650 mg via ORAL
  Filled 2014-11-30: qty 2

## 2014-11-30 NOTE — Discharge Instructions (Signed)
Take tylenol, motrin for pain.   Follow up with your doctor.   You might be confused for several days from the concussion.   Return to ER if you are unable to walk, more confusion, vomiting.

## 2014-11-30 NOTE — ED Provider Notes (Addendum)
CSN: [id number]     Arrival date & time 11/30/14  1253 History   First MD Initiated Contact with Patient 11/30/14 1341     Chief Complaint  Patient presents with  . Fall  . Hip Pain  . Altered Mental Status     (Consider location/radiation/quality/duration/timing/severity/associated sxs/prior Treatment) The history is provided by the patient.  [name] is a 81 y.o. female hx of inferior STEMI, CAD s/p CABG, ischemic cardiomyopathy, diabetes here presenting with fall. She fell 2 days ago. She was going to the refrigerator and turned around and accidentally lost her balance and hit the left side of her head on the refrigerator. She also hit the left hip. She had some trouble walking afterwards but able to walk. She has been progressively more tired and confused. Also more forgetful. Denies any syncope.   Past Medical History  Diagnosis Date  . ST elevation myocardial infarction (STEMI) of inferior wall 05/2011; 03/10/2012    a) 11/'12: 100% RCA, dLM 80% -- POBA of RCA (for planned CABG, not done until re-admission with NSTEMI 1/'13);; b) 8/'13: 100% SVG-RCA (PCI & re-PCI), native RCA 100%; Patent LIMA-p-mLAD, SVG-OM  . CAD S/P percutaneous coronary angioplasty 11/'12; 8/31 & 9/1/'13    a) MV: 100% RCA-POBA, dLM left main 80%; b) 1/'13: NSTEMI --> CABG; c) 2013: 8/31 - 100% RCA & acute SVG-RCA, 100% SVG-OM, 90% LM, 80% p&mLAD, ~70% RI, 50% Cx --> PCI-SVG-RCA: Promus Element DES x 3 (prox 2.5 mm x 38 mm & 2.5 mm x 16 mm, distal 2.5 mm x 16 mm); on 9/1 - accute in-stent Thrombosis - Aspiration thrombectomy & PTCA  . S/P CABG x 4 08/05/11    Dr. Tyrone Sage: LIMA-p-mLAD, SVG-RPDA, SVG-OM; known  100% SVG-OM, Extensive PTCA of SVG-RCA;;  Hospital course complicated by Afib & PNA; after d/c cellulitis of SVG harvest site due to edema  . Episodic atrial fibrillation 07/2011    Post-Op CABG  . Ischemic cardiomyopathy 03/2012    Echo: EF 45-50% - Mild basal- mid inferolateral Hypokinesis: Gr 1 DD,  mildly increased PAP.  Marland Kitchen Dyslipidemia, goal LDL below 70     statin intolerance (lipitor, crestor drug reaction); Welchol & Zetia  . Hypertension, essential   . DM (diabetes mellitus), type 2 with renal complications   . Chronic kidney disease (CKD) stage G3b/A1, moderately decreased glomerular filtration rate (GFR) between 30-44 mL/min/1.73 square meter and albuminuria creatinine ratio less than 30 mg/g   . History of pneumonia     post-op from CABG  . Hypothyroidism     on synthroid  . Headache(784.0)   . Osteoarthritis   . Anxiety     PRN Xanax   . Dermatophytosis of nail   . Gout     on Allopurinol  . Lower extremity edema     chronic   Past Surgical History  Procedure Laterality Date  . Cardiac catheterization  November 2012; August and September 2013    Patent LIMA-LAD, patent she had an OM, patent stented SVG-RCA; occluded native RCA, 90% ostial left main, tandem 80% proximal and mid LAD, 70% mid Circumflex  . Coronary angioplasty  November 2012    PTCA only of RCA and setting of inferior STEMI  . Coronary artery bypass graft  08/05/2011    Procedure: CORONARY ARTERY BYPASS GRAFTING (CABG);  Surgeon: [name], MD;  Location: [facility] OR;  Service: Open Heart Surgery;  Laterality: N/A;  coronary artery bypass graft times 4 using left internal mammary  artery and right leg saphenous vein harvested endoscopically  . Joint replacement      both hips replaced  . I&d extremity  09/21/2011    Procedure: IRRIGATION AND DEBRIDEMENT EXTREMITY;  Surgeon: [name], MD;  Location: [facility] OR;  Service: Vascular;  Laterality: Right;  with wound vac placement  . Coronary angioplasty with stent placement  03/10/2012     infferior STEMI: Occluded native RCA and SVG-RCA --> thrombectomy and 3 stent placement to the SVG-RCA (2.5 mm right 38 mm and 2 proximal distal overlapping 2.5 mm x 16 mm Promus DES stents:  . Coronary angioplasty  03/11/2012    Inferior STEMI #3: Reoccluded SVG-RCA;  extensive thrombectomy and post dilation PTCA  . Transthoracic echocardiogram  September 2013    EF 45-50%, mild hypokinesis of the basal and mid inferolateral wall, grade 1 diastolic dysfunction, mildly increased artery pressures.  . Left heart catheterization with coronary angiogram N/A 05/26/2011    Procedure: LEFT HEART CATHETERIZATION WITH CORONARY ANGIOGRAM;  Surgeon: Marykay Lex, MD;  Location: [facility] CATH LAB;  Service: Cardiovascular;  Laterality: N/A;  . Percutaneous coronary intervention-balloon only N/A 05/26/2011    Procedure: PERCUTANEOUS CORONARY INTERVENTION-BALLOON ONLY;  Surgeon: Marykay Lex, MD;  Location: [facility] CATH LAB;  Service: Cardiovascular;  Laterality: N/A;  . Left heart cath N/A 03/10/2012    Procedure: LEFT HEART CATH;  Surgeon: Herby Abraham, MD;  Location: [facility] CATH LAB;  Service: Cardiovascular;  Laterality: N/A;  . Percutaneous coronary stent intervention (pci-s)  03/10/2012    Procedure: PERCUTANEOUS CORONARY STENT INTERVENTION (PCI-S);  Surgeon: Herby Abraham, MD;  Location: [facility] CATH LAB;  Service: Cardiovascular;;  . Left heart catheterization with coronary angiogram Bilateral 03/11/2012    Procedure: LEFT HEART CATHETERIZATION WITH CORONARY ANGIOGRAM;  Surgeon: Thurmon Fair, MD;  Location: [facility] CATH LAB;  Service: Cardiovascular;  Laterality: Bilateral;   Family History  Problem Relation Age of Onset  . Breast cancer Mother   . Heart disease Father    History  Substance Use Topics  . Smoking status: Never Smoker   . Smokeless tobacco: Never Used  . Alcohol Use: 0.6 oz/week    1 Glasses of wine per week   OB History    No data available     Review of Systems  Musculoskeletal: Positive for back pain.       L hip pain   All other systems reviewed and are negative.     Allergies  Tape and Statins  Home Medications   Prior to Admission medications   Medication Sig Start Date End Date Taking? Authorizing Provider  acetaminophen (TYLENOL) 325  MG tablet Take 650 mg by mouth 2 (two) times daily.   Yes Historical Provider, MD  allopurinol (ZYLOPRIM) 100 MG tablet Take 100 mg by mouth daily.    Yes Historical Provider, MD  carvedilol (COREG) 6.25 MG tablet Take 1 tablet (6.25 mg total) by mouth 2 (two) times daily with a meal. 08/15/14  Yes Marykay Lex, MD  diazepam (VALIUM) 5 MG tablet Take 0.5 tablets (2.5 mg total) by mouth daily. 04/12/14  Yes Oswaldo Conroy, PA-C  Diclofenac Sodium 2 % SOLN Apply 1 application topically as needed (knee pain).   Yes Historical Provider, MD  furosemide (LASIX) 40 MG tablet Take 40 mg by mouth 2 (two) times daily.   Yes Historical Provider, MD  isosorbide mononitrate (IMDUR) 30 MG 24 hr tablet Take 1 tablet (30 mg total) by mouth daily. 03/15/12 11/30/14 Yes  Eda Paschal Kilroy, PA-C  levothyroxine (SYNTHROID, LEVOTHROID) 100 MCG tablet Take 100 mcg by mouth daily.    Yes Historical Provider, MD  LORazepam (ATIVAN) 0.5 MG tablet Take 0.5 mg by mouth daily.  01/16/13  Yes Historical Provider, MD  pantoprazole (PROTONIX) 40 MG tablet Take 40 mg by mouth daily. 09/24/14  Yes Historical Provider, MD  sodium bicarbonate 650 MG tablet Take 650 mg by mouth daily.     Yes Historical Provider, MD  ticagrelor (BRILINTA) 90 MG TABS tablet TAKE 1 TABLET (90 MG TOTAL) BY MOUTH 2 (TWO) TIMES DAILY. 08/15/14  Yes Marykay Lex, MD  [facility] 625 MG tablet Take 3,750 mg by mouth daily. 09/02/14  Yes Historical Provider, MD  ZETIA 10 MG tablet Take 10 mg by mouth daily. 09/24/14  Yes Historical Provider, MD   BP 136/65 mmHg  Pulse 70  Temp(Src) 97.3 F (36.3 C) (Oral)  Resp 5  Ht 5\' 1"  (1.549 m)  Wt 140 lb (63.504 kg)  BMI 26.47 kg/m2  SpO2 98% Physical Exam  Constitutional:  Chronically ill   HENT:  Head: Normocephalic.  Mouth/Throat: Oropharynx is clear and moist.  Abrasion L face, no bony tenderness   Eyes: Conjunctivae are normal. Pupils are equal, round, and reactive to light.  Neck: Normal range of motion. Neck  supple.  Cardiovascular: Normal rate, regular rhythm and normal heart sounds.   Pulmonary/Chest: Effort normal and breath sounds normal. No respiratory distress. She has no wheezes. She has no rales.  Abdominal: Soft. Bowel sounds are normal. She exhibits no distension. There is no tenderness.  Musculoskeletal:  Mild lower lumbar tenderness. L upper thigh with ecchymosis and tenderness. Able to move L hip with some pain. L knee with contusion and tenderness. Abrasion R wrist no tenderness.   Neurological: She is alert.  Moving all extremities except L leg   Skin: Skin is warm and dry.  Psychiatric: She has a normal mood and affect. Her behavior is normal. Judgment and thought content normal.  Nursing note and vitals reviewed.   ED Course  Procedures (including critical care time) Labs Review Labs Reviewed  CBC - Abnormal; Notable for the following:    RBC 3.62 (*)    Hemoglobin 9.3 (*)    HCT 28.9 (*)    MCH 25.7 (*)    RDW 22.0 (*)    Platelets 140 (*)    All other components within normal limits  COMPREHENSIVE METABOLIC PANEL - Abnormal; Notable for the following:    Chloride 113 (*)    Glucose, Bld 121 (*)    BUN 36 (*)    Creatinine, Ser 1.95 (*)    Total Protein 6.3 (*)    Albumin 3.0 (*)    ALT 13 (*)    GFR calc non Af Amer 21 (*)    GFR calc Af Amer 25 (*)    All other components within normal limits  CBG MONITORING, ED - Abnormal; Notable for the following:    Glucose-Capillary 110 (*)    All other components within normal limits  URINALYSIS, ROUTINE W REFLEX MICROSCOPIC    Imaging Review Dg Chest 1 View  11/30/2014   CLINICAL DATA:  Fall from toilet 3 days ago with persistent chest pain  EXAM: CHEST  1 VIEW  COMPARISON:  04/12/2014  FINDINGS: Cardiac shadow is enlarged. Aortic calcifications are noted. Postsurgical changes are seen. Multiple coronary stents are noted. The lungs are clear bilaterally. No acute bony abnormality is seen.  IMPRESSION:  No acute  abnormality noted.  Stable cardiomegaly.   Electronically Signed   By: Alcide Clever M.D.   On: 11/30/2014 15:28   Dg Lumbar Spine Complete  11/30/2014   CLINICAL DATA:  Fall getting off toilet 3 days ago. Severe left hip and leg pain. Difficulty bearing weight.  EXAM: LUMBAR SPINE - COMPLETE 4+ VIEW  COMPARISON:  None.  FINDINGS: No fracture. Slight anterolisthesis of L4 on L5 related to facet disease. Mild degenerative disc and facet disease diffusely. SI joints are symmetric and unremarkable.  IMPRESSION: No acute bony abnormality.   Electronically Signed   By: Charlett Nose M.D.   On: 11/30/2014 15:31   Dg Pelvis 1-2 Views  11/30/2014   CLINICAL DATA:  Fall getting off twin with 3 days ago. Severe left hip and leg pain.  EXAM: PELVIS - 1-2 VIEW  COMPARISON:  01/01/2007  FINDINGS: There is soft tissue swelling over the left lateral hip, likely bruising. Bilateral hip replacements noted. No hardware or bony complicating feature. No fracture, subluxation or dislocation.  IMPRESSION: Left lateral soft tissue swelling/bruising. No acute bony abnormality.   Electronically Signed   By: Charlett Nose M.D.   On: 11/30/2014 15:28   Ct Head Wo Contrast  11/30/2014   CLINICAL DATA:  Patient status post fall on Friday. Patient hit her head. Altered mental status.  EXAM: CT HEAD WITHOUT CONTRAST  CT CERVICAL SPINE WITHOUT CONTRAST  TECHNIQUE: Multidetector CT imaging of the head and cervical spine was performed following the standard protocol without intravenous contrast. Multiplanar CT image reconstructions of the cervical spine were also generated.  COMPARISON:  Brain CT 09/19/2014  FINDINGS: CT HEAD FINDINGS  Ventricles and sulci are appropriate for patient's age. No evidence for acute cortically based infarct, intracranial hemorrhage, mass lesion or mass effect. The orbits are unremarkable. Mucosal thickening within the bilateral maxillary sinuses. Sphenoid sinus and ethmoid air cells are unremarkable. Mastoid air  cells are well aerated. Calvarium is intact.  CT CERVICAL SPINE FINDINGS  Small calcification within the left lobe of the thyroid. The lung apices are unremarkable. Relative straightening of the normal cervical lordosis. Preservation of the vertebral body heights. No evidence for acute cervical spine fracture. Degenerative disc disease most pronounced C5-6 and C6-7. Left sided facet degenerative change most pronounced C2-3 and C3-4. Craniocervical junction is intact. Prevertebral soft tissues are unremarkable. Left TMJ joint degenerative change. 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  IMPRESSION: No acute intracranial process.  No acute cervical spine fracture.  Degenerative disc disease.   Electronically Signed   By: Annia Belt M.D.   On: 11/30/2014 15:07   Ct Cervical Spine Wo Contrast  11/30/2014   CLINICAL DATA:  Patient status post fall on Friday. Patient hit her head. Altered mental status.  EXAM: CT HEAD WITHOUT CONTRAST  CT CERVICAL SPINE WITHOUT CONTRAST  TECHNIQUE: Multidetector CT imaging of the head and cervical spine was performed following the standard protocol without intravenous contrast. Multiplanar CT image reconstructions of the cervical spine were also generated.  COMPARISON:  Brain CT 09/19/2014  FINDINGS: CT HEAD FINDINGS  Ventricles and sulci are appropriate for patient's age. No evidence for acute cortically based infarct, intracranial hemorrhage, mass lesion or mass effect. The orbits are unremarkable. Mucosal thickening within the bilateral maxillary sinuses. Sphenoid sinus and ethmoid air cells are unremarkable. Mastoid air cells are well aerated. Calvarium is intact.  CT CERVICAL SPINE FINDINGS  Small calcification within the left lobe of the thyroid. The  lung apices are unremarkable. Relative straightening of the normal cervical lordosis. Preservation of the vertebral body heights. No evidence for acute cervical spine fracture. Degenerative disc disease most pronounced  C5-6 and C6-7. Left sided facet degenerative change most pronounced C2-3 and C3-4. Craniocervical junction is intact. Prevertebral soft tissues are unremarkable. Left TMJ joint degenerative change. 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  IMPRESSION: No acute intracranial process.  No acute cervical spine fracture.  Degenerative disc disease.   Electronically Signed   By: Annia Belt M.D.   On: 11/30/2014 15:07   Dg Knee Complete 4 Views Left  11/30/2014   CLINICAL DATA:  Fall from toilet 3 days ago with persistent knee pain  EXAM: LEFT KNEE - COMPLETE 4+ VIEW  COMPARISON:  None.  FINDINGS: Significant degenerative changes of the left knee joint are noted in all 3 joint compartments but worst in the medial joint space. No significant effusion is seen. No acute fracture is noted. Diffuse vascular calcifications are seen.  IMPRESSION: Degenerative changes without acute abnormality.   Electronically Signed   By: Alcide Clever M.D.   On: 11/30/2014 15:29   Dg Femur Min 2 Views Left  11/30/2014   CLINICAL DATA:  81 year old who fell while getting off of the toilet 3 days ago, complaining of severe pain in the left hip and the entire left leg.  EXAM: LEFT FEMUR 2 VIEWS  COMPARISON:  Left knee x-rays and AP pelvis x-ray obtained concurrently. No prior left femur imaging.  FINDINGS: Prior left total hip arthroplasty with anatomic alignment and no complicating features. No acute fractures involving the femur. Please see the separate report of the concurrent knee imaging regarding degenerative changes.  IMPRESSION: No acute osseous abnormality. Left total hip arthroplasty with anatomic alignment and no complicating features.   Electronically Signed   By: Hulan Saas M.D.   On: 11/30/2014 15:30     EKG Interpretation   Date/Time:  Sunday Nov 30 2014 13:05:51 EDT Ventricular Rate:  65 PR Interval:  214 QRS Duration: 80 QT Interval:  394 QTC Calculation: 409 R Axis:   44 Text Interpretation:   Sinus rhythm with 1st degree A-V block Possible  Anterior infarct , age undetermined ST \\T \ T wave abnormality, consider  inferior ischemia Abnormal ECG No significant change since last tracing  Confirmed by YAO  MD, DAVID ([id number]) on 11/30/2014 1:57:19 PM      MDM   Final diagnoses:  Hip injury, left, initial encounter  Hip injury, left, initial encounter  Fall    [name] is a 81 y.o. female here with fall, confusion, leg pain. Will get CT head/neck. Will get xrays and labs.   3:34 PM CT head/neck unremarkable. Has L thigh hematoma but xrays showed no fracture. Able to move l leg and has been bearing weight on it so I doubt occult fracture. Cr at baseline. Hg only slightly lower than baseline. Offered admission for confusion but patient has care at home and family felt that she is better at home and doesn't want further workup. Likely post concussive symptoms. I don't see signs of stroke.      Richardean Canal, MD 11/30/14 1538  Richardean Canal, MD 11/30/14 1540

## 2014-11-30 NOTE — ED Notes (Signed)
Pt fell Friday causing a knot to her left hip and hit her head. Pt began having confusion Saturday. Pt has abrasion on right wrist. Family is from out of town.

## 2014-12-23 ENCOUNTER — Telehealth: Payer: Self-pay | Admitting: Cardiology

## 2014-12-23 NOTE — Telephone Encounter (Signed)
She wants to know what can she take in  the place of Welchol and Zetia? They both are too expensive.

## 2014-12-23 NOTE — Telephone Encounter (Signed)
There are really any new options.  I think for now or previously limited to these options.  The other option is just not be as aggressive treating lipids and understand that means is there some risks. Given age she may not want to take all her extra medicines. If that's the case, then fibrillate and stop Zetia

## 2014-12-23 NOTE — Telephone Encounter (Signed)
Pt requesting options in place of Welchol, Zetia - realizes no generic available for Zetia. Will defer to physician.

## 2014-12-23 NOTE — Telephone Encounter (Signed)
Called back to give instruction, no answer when dialed.

## 2014-12-25 NOTE — Telephone Encounter (Signed)
Pt's daughter called, informed of Dr. Elissa Hefty suggestion to stop Zetia. She voiced understanding w/ given instructions.

## 2015-01-09 DIAGNOSIS — M1711 Unilateral primary osteoarthritis, right knee: Secondary | ICD-10-CM | POA: Diagnosis not present

## 2015-01-09 DIAGNOSIS — M170 Bilateral primary osteoarthritis of knee: Secondary | ICD-10-CM | POA: Diagnosis not present

## 2015-01-09 DIAGNOSIS — M1712 Unilateral primary osteoarthritis, left knee: Secondary | ICD-10-CM | POA: Diagnosis not present

## 2015-01-19 DIAGNOSIS — W19XXXA Unspecified fall, initial encounter: Secondary | ICD-10-CM | POA: Diagnosis not present

## 2015-01-19 DIAGNOSIS — E789 Disorder of lipoprotein metabolism, unspecified: Secondary | ICD-10-CM | POA: Diagnosis not present

## 2015-01-19 DIAGNOSIS — E118 Type 2 diabetes mellitus with unspecified complications: Secondary | ICD-10-CM | POA: Diagnosis not present

## 2015-01-19 DIAGNOSIS — N939 Abnormal uterine and vaginal bleeding, unspecified: Secondary | ICD-10-CM | POA: Diagnosis not present

## 2015-02-03 ENCOUNTER — Other Ambulatory Visit: Payer: Self-pay | Admitting: Cardiology

## 2015-02-03 NOTE — Telephone Encounter (Signed)
REFILL 

## 2015-02-09 ENCOUNTER — Ambulatory Visit: Payer: Medicare Other | Admitting: Podiatry

## 2015-02-10 ENCOUNTER — Encounter: Payer: Self-pay | Admitting: Podiatry

## 2015-02-10 ENCOUNTER — Ambulatory Visit (INDEPENDENT_AMBULATORY_CARE_PROVIDER_SITE_OTHER): Payer: Medicare Other | Admitting: Podiatry

## 2015-02-10 DIAGNOSIS — B351 Tinea unguium: Secondary | ICD-10-CM

## 2015-02-10 DIAGNOSIS — M79676 Pain in unspecified toe(s): Secondary | ICD-10-CM

## 2015-02-10 NOTE — Patient Instructions (Signed)
Diabetes and Foot Care Diabetes may cause you to have problems because of poor blood supply (circulation) to your feet and legs. This may cause the skin on your feet to become thinner, break easier, and heal more slowly. Your skin may become dry, and the skin may peel and crack. You may also have nerve damage in your legs and feet causing decreased feeling in them. You may not notice minor injuries to your feet that could lead to infections or more serious problems. Taking care of your feet is one of the most important things you can do for yourself.  HOME CARE INSTRUCTIONS  Wear shoes at all times, even in the house. Do not go barefoot. Bare feet are easily injured.  Check your feet daily for blisters, cuts, and redness. If you cannot see the bottom of your feet, use a mirror or ask someone for help.  Wash your feet with warm water (do not use hot water) and mild soap. Then pat your feet and the areas between your toes until they are completely dry. Do not soak your feet as this can dry your skin.  Apply a moisturizing lotion or petroleum jelly (that does not contain alcohol and is unscented) to the skin on your feet and to dry, brittle toenails. Do not apply lotion between your toes.  Trim your toenails straight across. Do not dig under them or around the cuticle. File the edges of your nails with an emery board or nail file.  Do not cut corns or calluses or try to remove them with medicine.  Wear clean socks or stockings every day. Make sure they are not too tight. Do not wear knee-high stockings since they may decrease blood flow to your legs.  Wear shoes that fit properly and have enough cushioning. To break in new shoes, wear them for just a few hours a day. This prevents you from injuring your feet. Always look in your shoes before you put them on to be sure there are no objects inside.  Do not cross your legs. This may decrease the blood flow to your feet.  If you find a minor scrape,  cut, or break in the skin on your feet, keep it and the skin around it clean and dry. These areas may be cleansed with mild soap and water. Do not cleanse the area with peroxide, alcohol, or iodine.  When you remove an adhesive bandage, be sure not to damage the skin around it.  If you have a wound, look at it several times a day to make sure it is healing.  Do not use heating pads or hot water bottles. They may burn your skin. If you have lost feeling in your feet or legs, you may not know it is happening until it is too late.  Make sure your health care provider performs a complete foot exam at least annually or more often if you have foot problems. Report any cuts, sores, or bruises to your health care provider immediately. SEEK MEDICAL CARE IF:   You have an injury that is not healing.  You have cuts or breaks in the skin.  You have an ingrown nail.  You notice redness on your legs or feet.  You feel burning or tingling in your legs or feet.  You have pain or cramps in your legs and feet.  Your legs or feet are numb.  Your feet always feel cold. SEEK IMMEDIATE MEDICAL CARE IF:   There is increasing redness,   swelling, or pain in or around a wound.  There is a red line that goes up your leg.  Pus is coming from a wound.  You develop a fever or as directed by your health care provider.  You notice a bad smell coming from an ulcer or wound. Document Released: 06/24/2000 Document Revised: 02/27/2013 Document Reviewed: 12/04/2012 Exit[employer] Patient Information 2015 [employer]. This information is not intended to replace advice given to you by your health care provider. Make sure you discuss any questions you have with your health care provider.  

## 2015-02-10 NOTE — Progress Notes (Signed)
Patient ID: [name], female   DOB: 1920-10-13, 81 y.o.   MRN: [id number]  Subjective: This patient presents for a scheduled visit complaining of painful toenails and is requesting toenail debridement  Objective: The toenails are brittle, discolored, hypertrophic, incurvated, elongated and tender to direct palpation 6-10  Assessment: Type II diabetic Symptomatic onychomycoses 6-10  Plan: Debridement of toenails mechanically and electrically without any bleeding  Reappoint 3 months

## 2015-02-11 ENCOUNTER — Ambulatory Visit (INDEPENDENT_AMBULATORY_CARE_PROVIDER_SITE_OTHER): Payer: Medicare Other | Admitting: Cardiology

## 2015-02-11 VITALS — BP 126/70 | HR 54 | Ht 61.0 in | Wt 146.6 lb

## 2015-02-11 DIAGNOSIS — I10 Essential (primary) hypertension: Secondary | ICD-10-CM | POA: Diagnosis not present

## 2015-02-11 DIAGNOSIS — I2510 Atherosclerotic heart disease of native coronary artery without angina pectoris: Secondary | ICD-10-CM | POA: Diagnosis not present

## 2015-02-11 DIAGNOSIS — I255 Ischemic cardiomyopathy: Secondary | ICD-10-CM

## 2015-02-11 DIAGNOSIS — I25812 Atherosclerosis of bypass graft of coronary artery of transplanted heart without angina pectoris: Secondary | ICD-10-CM

## 2015-02-11 DIAGNOSIS — E669 Obesity, unspecified: Secondary | ICD-10-CM

## 2015-02-11 DIAGNOSIS — E785 Hyperlipidemia, unspecified: Secondary | ICD-10-CM

## 2015-02-11 NOTE — Patient Instructions (Signed)
NO CHANGE IN CURRENT MEDICATIONS   Your physician wants you to follow-up in 6 MONTHS DR HARDING.-30 MIN APPOINTMENT  You will receive a reminder letter in the mail two months in advance. If you don't receive a letter, please call our office to schedule the follow-up appointment.

## 2015-02-11 NOTE — Progress Notes (Signed)
PCP: Michiel Sites, MD  Clinic Note: Chief Complaint  Patient presents with  . Follow-up  . Edema    bilateral legs  . Shortness of Breath    on exertion  . Dizziness    occasionally, whether sitting or walking    HPI: [name] is a 81 y.o. female with a PMH below who presents today for routine 6 month follow-up of her CAD. She has a very complicated CAD history that is chronicled below. She has mild ischemic cardiomyopathy but no real heart failure symptoms.  [name] was last seen on 08/15/2104: She was doing relatively well at that time. She did have some exertional dyspnea on occasion if she overdoes it. Mostly limited by knee pain at that time. No major changes were made to her medicines. We talked about sliding scale Lasix.  Recent Hospitalizations:   March 2016: Seen by neurology in the emergency room with generalized weakness and difficulty with word finding. MRI of the brain showed no acute abnormality just mild white matter atrophy and bilateral maxillary sinus disease.  11/30/2014: ER visit after a fall. She was walking to the refrigerator and lost her balance when turning around. She had her hip and had trouble walking. There was concern for mild confusion and forgetfulness as well. No syncope.  CT head/neck unremarkable. Left thigh hematoma but no fracture. Admission for concussion evaluation was offered, but she preferred to go home.  Studies Reviewed: MRIs and CT scans of the above otherwise no studies  Interval History: She presents today doing relatively well. She does have bilateral leg swelling which is baseline as is her exertional dyspnea. When you ask her more specifically, she is mostly limited by right knee pain. The struggle to walk makes her short of breath more so than simply being dyspneic on exertion. She has not had any recurrent episodes of chest tightness or pressure and no significant PND or orthopnea. She always sleeps on 2 pillows  but has not had PND. She does have chronic left greater than right edema but this is less than usual. When she had a fall back in May, she was given some pain medications or something and was hallucinating during the time of the emergency room. Other than that no untoward symptoms.    No chest pain with rest or exertion. No resting dyspnea No PND, orthopnea or edema.  No palpitations, weakness or syncope/near syncope. No TIA/amaurosis fugax symptoms -- since March  No melena, hematochezia, hematuria, or epstaxis. No claudication.  Past Medical History  Diagnosis Date  . ST elevation myocardial infarction (STEMI) of inferior wall 05/2011; 03/10/2012    a) 11/'12: 100% RCA, dLM 80% -- POBA of RCA (for planned CABG, not done until re-admission with NSTEMI 1/'13);; b) 8/'13: 100% SVG-RCA (PCI & re-PCI), native RCA 100%; Patent LIMA-p-mLAD, SVG-OM  . CAD S/P percutaneous coronary angioplasty 11/'12; 8/31 & 9/1/'13    a) MV: 100% RCA-POBA, dLM left main 80%; b) 1/'13: NSTEMI --> CABG; c) 2013: 8/31 - 100% RCA & acute SVG-RCA, 100% SVG-OM, 90% LM, 80% p&mLAD, ~70% RI, 50% Cx --> PCI-SVG-RCA: Promus Element DES x 3 (prox 2.5 mm x 38 mm & 2.5 mm x 16 mm, distal 2.5 mm x 16 mm); on 9/1 - accute in-stent Thrombosis - Aspiration thrombectomy & PTCA  . S/P CABG x 4 08/05/11    Dr. Tyrone Sage: LIMA-p-mLAD, SVG-RPDA, SVG-OM; known  100% SVG-OM, Extensive PTCA of SVG-RCA;;  Hospital course complicated by Afib &  PNA; after d/c cellulitis of SVG harvest site due to edema  . Episodic atrial fibrillation 07/2011    Post-Op CABG  . Ischemic cardiomyopathy 03/2012    Echo: EF 45-50% - Mild basal- mid inferolateral Hypokinesis: Gr 1 DD, mildly increased PAP.  Marland Kitchen Dyslipidemia, goal LDL below 70     statin intolerance (lipitor, crestor drug reaction); Welchol & Zetia  . Hypertension, essential   . DM (diabetes mellitus), type 2 with renal complications   . Chronic kidney disease (CKD) stage G3b/A1, moderately decreased  glomerular filtration rate (GFR) between 30-44 mL/min/1.73 square meter and albuminuria creatinine ratio less than 30 mg/g   . History of pneumonia     post-op from CABG  . Hypothyroidism     on synthroid  . Headache(784.0)   . Osteoarthritis   . Anxiety     PRN Xanax   . Dermatophytosis of nail   . Gout     on Allopurinol  . Lower extremity edema     chronic    Past Surgical History  Procedure Laterality Date  . Cardiac catheterization  November 2012; August and September 2013    Patent LIMA-LAD, patent she had an OM, patent stented SVG-RCA; occluded native RCA, 90% ostial left main, tandem 80% proximal and mid LAD, 70% mid Circumflex  . Coronary angioplasty  November 2012    PTCA only of RCA and setting of inferior STEMI  . Coronary artery bypass graft  08/05/2011    Procedure: CORONARY ARTERY BYPASS GRAFTING (CABG);  Surgeon: [name], MD;  Location: [facility] OR;  Service: Open Heart Surgery;  Laterality: N/A;  coronary artery bypass graft times 4 using left internal mammary artery and right leg saphenous vein harvested endoscopically  . Joint replacement      both hips replaced  . I&d extremity  09/21/2011    Procedure: IRRIGATION AND DEBRIDEMENT EXTREMITY;  Surgeon: [name], MD;  Location: [facility] OR;  Service: Vascular;  Laterality: Right;  with wound vac placement  . Coronary angioplasty with stent placement  03/10/2012     infferior STEMI: Occluded native RCA and SVG-RCA --> thrombectomy and 3 stent placement to the SVG-RCA (2.5 mm right 38 mm and 2 proximal distal overlapping 2.5 mm x 16 mm Promus DES stents:  . Coronary angioplasty  03/11/2012    Inferior STEMI #3: Reoccluded SVG-RCA; extensive thrombectomy and post dilation PTCA  . Transthoracic echocardiogram  September 2013    EF 45-50%, mild hypokinesis of the basal and mid inferolateral wall, grade 1 diastolic dysfunction, mildly increased artery pressures.  . Left heart catheterization with coronary angiogram  N/A 05/26/2011    Procedure: LEFT HEART CATHETERIZATION WITH CORONARY ANGIOGRAM;  Surgeon: Marykay Lex, MD;  Location: [facility] CATH LAB;  Service: Cardiovascular;  Laterality: N/A;  . Percutaneous coronary intervention-balloon only N/A 05/26/2011    Procedure: PERCUTANEOUS CORONARY INTERVENTION-BALLOON ONLY;  Surgeon: Marykay Lex, MD;  Location: [facility] CATH LAB;  Service: Cardiovascular;  Laterality: N/A;  . Left heart cath N/A 03/10/2012    Procedure: LEFT HEART CATH;  Surgeon: Herby Abraham, MD;  Location: [facility] CATH LAB;  Service: Cardiovascular;  Laterality: N/A;  . Percutaneous coronary stent intervention (pci-s)  03/10/2012    Procedure: PERCUTANEOUS CORONARY STENT INTERVENTION (PCI-S);  Surgeon: Herby Abraham, MD;  Location: [facility] CATH LAB;  Service: Cardiovascular;;  . Left heart catheterization with coronary angiogram Bilateral 03/11/2012    Procedure: LEFT HEART CATHETERIZATION WITH CORONARY ANGIOGRAM;  Surgeon: Thurmon Fair, MD;  Location: [facility] CATH LAB;  Service: Cardiovascular;  Laterality: Bilateral;    ROS: A comprehensive was performed. Review of Systems  Constitutional: Negative for weight loss and malaise/fatigue.  HENT: Negative for nosebleeds.   Respiratory: Negative for cough.   Cardiovascular: Positive for leg swelling. Negative for chest pain.  Gastrointestinal: Negative for blood in stool and melena.  Genitourinary: Negative for dysuria and hematuria.       She does have nocturia  Musculoskeletal: Positive for back pain, joint pain (Right> Left knee pain) and falls. Negative for myalgias.  Neurological: Positive for dizziness (Positional). Negative for seizures.  Endo/Heme/Allergies: Does not bruise/bleed easily.  Psychiatric/Behavioral: Positive for memory loss (Mild memory loss but nothing overtly abnormal).  All other systems reviewed and are negative.   Prior to Admission medications   Medication Sig Start Date End Date Taking? Authorizing Provider  acetaminophen  (TYLENOL) 325 MG tablet Take 650 mg by mouth 2 (two) times daily.   Yes Historical Provider, MD  allopurinol (ZYLOPRIM) 100 MG tablet Take 100 mg by mouth daily.    Yes Historical Provider, MD  BRILINTA 90 MG TABS tablet TAKE 1 TABLET (90 MG TOTAL) BY MOUTH 2 (TWO) TIMES DAILY. 02/03/15  Yes Marykay Lex, MD  carvedilol (COREG) 6.25 MG tablet Take 1 tablet (6.25 mg total) by mouth 2 (two) times daily with a meal. 08/15/14  Yes Marykay Lex, MD  diazepam (VALIUM) 5 MG tablet Take 0.5 tablets (2.5 mg total) by mouth daily. 04/12/14  Yes Oswaldo Conroy, PA-C  furosemide (LASIX) 40 MG tablet Take 40 mg by mouth 2 (two) times daily.   Yes Historical Provider, MD  levothyroxine (SYNTHROID, LEVOTHROID) 100 MCG tablet Take 100 mcg by mouth daily.    Yes Historical Provider, MD  LORazepam (ATIVAN) 0.5 MG tablet Take 0.5 mg by mouth daily.  01/16/13  Yes Historical Provider, MD  pantoprazole (PROTONIX) 40 MG tablet Take 40 mg by mouth daily. 09/24/14  Yes Historical Provider, MD  sodium bicarbonate 650 MG tablet Take 650 mg by mouth daily.     Yes Historical Provider, MD  [facility] 625 MG tablet Take 3,750 mg by mouth daily. 09/02/14  Yes Historical Provider, MD   Allergies  Allergen Reactions  . Tape     Use paper tape.  . Statins Rash     History   Social History  . Marital Status: Married    Spouse Name: N/A  . Number of Children: N/A  . Years of Education: N/A   Social History Main Topics  . Smoking status: Never Smoker   . Smokeless tobacco: Never Used  . Alcohol Use: 0.6 oz/week    1 Glasses of wine per week  . Drug Use: No  . Sexual Activity: No   Other Topics Concern  . 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 She is very active up and around the house, does not do routine exercise.   Does not smoke or drink.   Family History  Problem Relation Age of  Onset  . Breast cancer Mother   . Heart disease Father     Wt Readings from Last 3 Encounters:  02/11/15 146 lb 9.6 oz (66.497 kg)  11/30/14 140 lb (63.504 kg)  09/19/14 160 lb (72.576 kg)    PHYSICAL EXAM BP 126/70 mmHg  Pulse 54  Ht  (1.549 m)  Wt 146 lb 9.6 oz (66.497 kg)  BMI 27.71 kg/m2 General appearance: A&O x3 , cooperative, appears stated age, no distress, mildly obese and Very pleasant, well-nourished and well-groomed. Despite her age, she answers questions appropriately, and is quickwitted.  Neck: supple; No LAD, carotid bruit, or JVD  HEENT: [place]/AT, EOMI, MMM, anicteric sclera with mild arcus senilis  Lungs: CTAB, normal percussion bilaterally and Nonlabored, good air movement  Heart: RRR, S1 & S2 normal, no S3 or S4; Harsh 2/6 c-d SEM @ RUSB--> carotids; no click or rub  Abdomen: soft, non-tender; bowel sounds normal; no masses, no organomegaly  Extremities: edema 1-2+ bilateral pitting edema. Stable. No significant venous stasis changes noted. and no ulcers -- SVG harvest scars - well healed; Pulses: 2+ and symmetric Neurologic: Grossly normal;     Adult ECG Report n/a  Other studies Reviewed: Additional studies/ records that were reviewed today include:  Recent Labs:  Lab Results  Component Value Date   CREATININE 1.95* 11/30/2014    ASSESSMENT / PLAN: Problem List Items Addressed This Visit    CAD (coronary artery disease) of bypass graft, along with subtotal left main occlusion and and RCA occlusion - graft dependent - Primary (Chronic)    No active anginal symptoms. On stable dose of carvedilol and Imdur. Also on Lasix with sliding scale. Continuing Brilinta secondary to extensive stent in the vein graft. No aspirin. Statin intolerant.      Dyslipidemia with statin intolerance (Chronic)    Concern is the cost of WelChol. Not sure how effective or helpful WelChol is. If cost becomes an issue, I recommended they can simply stop.  Just today  concerned because she is no longer on Zetia.      Essential hypertension (Chronic)    Well-controlled on carvedilol      Inf STEMI #1 -PTCA to RCA (05/2011) + LM 80;NSTEMI (07/2011) - CABG x3;  inf STEMI #2 -- SVG-RCA  PCI with DES X 3 on 03/10/12; with re-occlusion 24hrs later (inferior STEMI #3) - redo thrombectomy/PTCA (Chronic)    As noted. No anginal symptoms. On stable regimen. Would not plan to do any surveillance stress tests. She is not very active and therefore less likely having symptoms. General consensus with her and the family is that they would probably prefer to avoid invasive evaluation unless symptoms get worse.      Ischemic cardiomyopathy, EF 45-50% 2D 03/13/12 (Chronic)    Really minimal heart failure symptoms. She is on Lasix and that keeps her swelling stable. She is on carvedilol and nitrate but not on ACE inhibitor or ARB secondary to renal insufficiency and concern for possible hypotension      Obesity (BMI 30-39.9) (Chronic)    3 stent that she isn't able to exercise, she is doing fine. His also monitoring her dietary intake. She did put on about 6 pounds since last visit which was 20 pounds down from her previous visit.         Current medicines are reviewed at length with the patient today. (+/- concerns) n/a The following changes have been made: NO CHANGE IN CURRENT MEDICATIONS   Your physician wants you to follow-up in 6 MONTHS DR Addaline Peplinski.-30 MIN APPOINTMENT  Studies Ordered:   No orders of the defined types were placed in this encounter.     Marykay Lex, M.D., M.S. Interventional Cardiologist   Pager # [phone]  ASSESSMENT / PLAN: Inf STEMI #1 -PTCA to RCA (05/2011) + LM 80;NSTEMI (07/2011) - CABG x3;  inf STEMI #2 -- SVG-RCA PCI with DES X 3 on 03/10/12; with re-occlusion 24hrs later (inferior STEMI #3) - redo thrombectomy/PTCA She is 3 years out from CABG and 2 half years out from her vein graft to RCA occlusion. Most recent echo  following that last event showed only mild reduction in EF. She is not very active and therefore may not know if she's having any exertional angina she does have suggestion of inferior wall hypokinesis that correlates with her to RCA related infarcts.   Ischemic cardiomyopathy, EF 45-50% 2D 03/13/12 Minimal heart failure symptoms. Her edema is much improved she denies any PND or orthopnea. She does have exertional dyspnea, which may at least in part be related to deconditioning. She is on carvedilol and Imdur with standing dose of Lasix. We discussed again the concept of sliding scale Lasix which she is quite familiar with.   CAD (coronary artery disease) of bypass graft, along with subtotal left main occlusion and and RCA occlusion - graft dependent No active anginal symptoms. She is on a good dose of Imdur and carvedilol. Because of the extensive amount of stent in her RCA she is on Brilinta but we stop the aspirin. No further bleeding. She is not on statin due to intolerance but is on Zetia.   Essential hypertension She is a bit more hypertensive today than she had been In the past. She is on carvedilol. This is the first time seizure being significantly hypertensive. I would like to reassess that. On my recheck, I got 140/70 mmHg.  She says at home her values are usually in the 140-150 mm mercury systolic range. Because she has positional dizziness and poor balance I am inclined to allow for mild permissive hypertension as long as she is not having any heart failure symptoms.   Dyslipidemia with statin intolerance Statin intolerant. On Zetia and WelChol. Lipids have initially improved, but it seemed to increased over the last few years. I discussed importance of dietary modifications.   Obesity (BMI 30-39.9) Unfortunately we are limited by her significant arthritis. Dietary changes are the mainstay --> she has lost ~10 lb since July.

## 2015-02-14 ENCOUNTER — Encounter: Payer: Self-pay | Admitting: Cardiology

## 2015-02-14 NOTE — Assessment & Plan Note (Signed)
Really minimal heart failure symptoms. She is on Lasix and that keeps her swelling stable. She is on carvedilol and nitrate but not on ACE inhibitor or ARB secondary to renal insufficiency and concern for possible hypotension

## 2015-02-14 NOTE — Assessment & Plan Note (Signed)
Concern is the cost of WelChol. Not sure how effective or helpful WelChol is. If cost becomes an issue, I recommended they can simply stop.  Just today concerned because she is no longer on Zetia.

## 2015-02-14 NOTE — Assessment & Plan Note (Signed)
No active anginal symptoms. On stable dose of carvedilol and Imdur. Also on Lasix with sliding scale. Continuing Brilinta secondary to extensive stent in the vein graft. No aspirin. Statin intolerant.

## 2015-02-14 NOTE — Assessment & Plan Note (Signed)
As noted. No anginal symptoms. On stable regimen. Would not plan to do any surveillance stress tests. She is not very active and therefore less likely having symptoms. General consensus with her and the family is that they would probably prefer to avoid invasive evaluation unless symptoms get worse.

## 2015-02-14 NOTE — Assessment & Plan Note (Signed)
3 stent that she isn't able to exercise, she is doing fine. His also monitoring her dietary intake. She did put on about 6 pounds since last visit which was 20 pounds down from her previous visit.

## 2015-02-14 NOTE — Assessment & Plan Note (Signed)
Well controlled on carvedilol 

## 2015-02-23 ENCOUNTER — Inpatient Hospital Stay (HOSPITAL_COMMUNITY)
Admission: EM | Admit: 2015-02-23 | Discharge: 2015-02-25 | DRG: 243 | Disposition: A | Discharge status: Home Health | Payer: Medicare Other | Attending: Cardiology | Admitting: Cardiology

## 2015-02-23 ENCOUNTER — Emergency Department (HOSPITAL_COMMUNITY): Payer: Medicare Other

## 2015-02-23 ENCOUNTER — Encounter (HOSPITAL_COMMUNITY): Payer: Self-pay | Admitting: *Deleted

## 2015-02-23 DIAGNOSIS — I517 Cardiomegaly: Secondary | ICD-10-CM | POA: Diagnosis not present

## 2015-02-23 DIAGNOSIS — I252 Old myocardial infarction: Secondary | ICD-10-CM

## 2015-02-23 DIAGNOSIS — R531 Weakness: Secondary | ICD-10-CM | POA: Diagnosis not present

## 2015-02-23 DIAGNOSIS — N184 Chronic kidney disease, stage 4 (severe): Secondary | ICD-10-CM | POA: Diagnosis present

## 2015-02-23 DIAGNOSIS — R6889 Other general symptoms and signs: Secondary | ICD-10-CM | POA: Diagnosis not present

## 2015-02-23 DIAGNOSIS — Z79899 Other long term (current) drug therapy: Secondary | ICD-10-CM | POA: Diagnosis not present

## 2015-02-23 DIAGNOSIS — Z7902 Long term (current) use of antithrombotics/antiplatelets: Secondary | ICD-10-CM

## 2015-02-23 DIAGNOSIS — Z951 Presence of aortocoronary bypass graft: Secondary | ICD-10-CM | POA: Diagnosis not present

## 2015-02-23 DIAGNOSIS — R001 Bradycardia, unspecified: Secondary | ICD-10-CM | POA: Diagnosis present

## 2015-02-23 DIAGNOSIS — Z955 Presence of coronary angioplasty implant and graft: Secondary | ICD-10-CM

## 2015-02-23 DIAGNOSIS — I442 Atrioventricular block, complete: Secondary | ICD-10-CM | POA: Insufficient documentation

## 2015-02-23 DIAGNOSIS — F419 Anxiety disorder, unspecified: Secondary | ICD-10-CM | POA: Diagnosis present

## 2015-02-23 DIAGNOSIS — M109 Gout, unspecified: Secondary | ICD-10-CM | POA: Diagnosis present

## 2015-02-23 DIAGNOSIS — Z791 Long term (current) use of non-steroidal anti-inflammatories (NSAID): Secondary | ICD-10-CM | POA: Diagnosis not present

## 2015-02-23 DIAGNOSIS — I129 Hypertensive chronic kidney disease with stage 1 through stage 4 chronic kidney disease, or unspecified chronic kidney disease: Secondary | ICD-10-CM | POA: Diagnosis present

## 2015-02-23 DIAGNOSIS — I2510 Atherosclerotic heart disease of native coronary artery without angina pectoris: Secondary | ICD-10-CM | POA: Diagnosis not present

## 2015-02-23 DIAGNOSIS — Z888 Allergy status to other drugs, medicaments and biological substances status: Secondary | ICD-10-CM | POA: Diagnosis not present

## 2015-02-23 DIAGNOSIS — I441 Atrioventricular block, second degree: Secondary | ICD-10-CM | POA: Diagnosis not present

## 2015-02-23 DIAGNOSIS — E039 Hypothyroidism, unspecified: Secondary | ICD-10-CM | POA: Diagnosis not present

## 2015-02-23 DIAGNOSIS — Z8249 Family history of ischemic heart disease and other diseases of the circulatory system: Secondary | ICD-10-CM | POA: Diagnosis not present

## 2015-02-23 DIAGNOSIS — Z91048 Other nonmedicinal substance allergy status: Secondary | ICD-10-CM

## 2015-02-23 DIAGNOSIS — I499 Cardiac arrhythmia, unspecified: Secondary | ICD-10-CM | POA: Diagnosis not present

## 2015-02-23 DIAGNOSIS — E785 Hyperlipidemia, unspecified: Secondary | ICD-10-CM | POA: Diagnosis not present

## 2015-02-23 DIAGNOSIS — E038 Other specified hypothyroidism: Secondary | ICD-10-CM | POA: Diagnosis not present

## 2015-02-23 DIAGNOSIS — Z96643 Presence of artificial hip joint, bilateral: Secondary | ICD-10-CM | POA: Diagnosis not present

## 2015-02-23 DIAGNOSIS — E1122 Type 2 diabetes mellitus with diabetic chronic kidney disease: Secondary | ICD-10-CM | POA: Diagnosis present

## 2015-02-23 DIAGNOSIS — R918 Other nonspecific abnormal finding of lung field: Secondary | ICD-10-CM | POA: Diagnosis not present

## 2015-02-23 DIAGNOSIS — I255 Ischemic cardiomyopathy: Secondary | ICD-10-CM | POA: Diagnosis present

## 2015-02-23 DIAGNOSIS — W19XXXA Unspecified fall, initial encounter: Secondary | ICD-10-CM

## 2015-02-23 DIAGNOSIS — M19012 Primary osteoarthritis, left shoulder: Secondary | ICD-10-CM | POA: Diagnosis not present

## 2015-02-23 DIAGNOSIS — M25551 Pain in right hip: Secondary | ICD-10-CM | POA: Diagnosis not present

## 2015-02-23 DIAGNOSIS — Z950 Presence of cardiac pacemaker: Secondary | ICD-10-CM | POA: Diagnosis not present

## 2015-02-23 LAB — PROTIME-INR
INR: 1.25 (ref 0.00–1.49)
INR: 1.27 (ref 0.00–1.49)
Prothrombin Time: 15.8 seconds — ABNORMAL HIGH (ref 11.6–15.2)
Prothrombin Time: 16 seconds — ABNORMAL HIGH (ref 11.6–15.2)

## 2015-02-23 LAB — CBC WITH DIFFERENTIAL/PLATELET
BASOS PCT: 0 % (ref 0–1)
Basophils Absolute: 0 10*3/uL (ref 0.0–0.1)
EOS ABS: 0 10*3/uL (ref 0.0–0.7)
Eosinophils Relative: 1 % (ref 0–5)
HCT: 28.8 % — ABNORMAL LOW (ref 36.0–46.0)
HEMOGLOBIN: 9 g/dL — AB (ref 12.0–15.0)
LYMPHS ABS: 1.1 10*3/uL (ref 0.7–4.0)
LYMPHS PCT: 25 % (ref 12–46)
MCH: 25.7 pg — AB (ref 26.0–34.0)
MCHC: 31.3 g/dL (ref 30.0–36.0)
MCV: 82.3 fL (ref 78.0–100.0)
MONO ABS: 0.4 10*3/uL (ref 0.1–1.0)
Monocytes Relative: 9 % (ref 3–12)
NEUTROS ABS: 3 10*3/uL (ref 1.7–7.7)
Neutrophils Relative %: 65 % (ref 43–77)
Platelets: 112 10*3/uL — ABNORMAL LOW (ref 150–400)
RBC: 3.5 MIL/uL — ABNORMAL LOW (ref 3.87–5.11)
RDW: 21.4 % — AB (ref 11.5–15.5)
WBC: 4.5 10*3/uL (ref 4.0–10.5)

## 2015-02-23 LAB — COMPREHENSIVE METABOLIC PANEL
ALBUMIN: 2.8 g/dL — AB (ref 3.5–5.0)
ALK PHOS: 123 U/L (ref 38–126)
ALK PHOS: 131 U/L — AB (ref 38–126)
ALT: 13 U/L — ABNORMAL LOW (ref 14–54)
ALT: 16 U/L (ref 14–54)
ANION GAP: 9 (ref 5–15)
AST: 26 U/L (ref 15–41)
AST: 30 U/L (ref 15–41)
Albumin: 2.8 g/dL — ABNORMAL LOW (ref 3.5–5.0)
Anion gap: 8 (ref 5–15)
BILIRUBIN TOTAL: 0.4 mg/dL (ref 0.3–1.2)
BILIRUBIN TOTAL: 0.4 mg/dL (ref 0.3–1.2)
BUN: 40 mg/dL — ABNORMAL HIGH (ref 6–20)
BUN: 39 mg/dL — AB (ref 6–20)
CALCIUM: 8.8 mg/dL — AB (ref 8.9–10.3)
CALCIUM: 9.2 mg/dL (ref 8.9–10.3)
CO2: 22 mmol/L (ref 22–32)
CO2: 24 mmol/L (ref 22–32)
CREATININE: 2.53 mg/dL — AB (ref 0.44–1.00)
Chloride: 114 mmol/L — ABNORMAL HIGH (ref 101–111)
Chloride: 112 mmol/L — ABNORMAL HIGH (ref 101–111)
Creatinine, Ser: 2.63 mg/dL — ABNORMAL HIGH (ref 0.44–1.00)
GFR calc Af Amer: 17 mL/min — ABNORMAL LOW (ref >60)
GFR calc Af Amer: 18 mL/min — ABNORMAL LOW (ref >60)
GFR calc non Af Amer: 15 mL/min — ABNORMAL LOW (ref >60)
GFR calc non Af Amer: 16 mL/min — ABNORMAL LOW (ref >60)
GLUCOSE: 124 mg/dL — AB (ref 65–99)
Glucose, Bld: 168 mg/dL — ABNORMAL HIGH (ref 65–99)
POTASSIUM: 4.1 mmol/L (ref 3.5–5.1)
Potassium: 3.9 mmol/L (ref 3.5–5.1)
Sodium: 144 mmol/L (ref 135–145)
Sodium: 145 mmol/L (ref 135–145)
TOTAL PROTEIN: 5.2 g/dL — AB (ref 6.5–8.1)
TOTAL PROTEIN: 5.2 g/dL — AB (ref 6.5–8.1)

## 2015-02-23 LAB — I-STAT CHEM 8, ED
BUN: 42 mg/dL — ABNORMAL HIGH (ref 6–20)
CALCIUM ION: 1.24 mmol/L (ref 1.13–1.30)
Chloride: 114 mmol/L — ABNORMAL HIGH (ref 101–111)
Creatinine, Ser: 2.5 mg/dL — ABNORMAL HIGH (ref 0.44–1.00)
Glucose, Bld: 162 mg/dL — ABNORMAL HIGH (ref 65–99)
HCT: 32 % — ABNORMAL LOW (ref 36.0–46.0)
Hemoglobin: 10.9 g/dL — ABNORMAL LOW (ref 12.0–15.0)
Potassium: 4.1 mmol/L (ref 3.5–5.1)
SODIUM: 145 mmol/L (ref 135–145)
TCO2: 23 mmol/L (ref 0–100)

## 2015-02-23 LAB — I-STAT TROPONIN, ED: TROPONIN I, POC: 0 ng/mL (ref 0.00–0.08)

## 2015-02-23 LAB — TSH: TSH: 2.65 u[IU]/mL (ref 0.350–4.500)

## 2015-02-23 LAB — GLUCOSE, CAPILLARY: Glucose-Capillary: 125 mg/dL — ABNORMAL HIGH (ref 65–99)

## 2015-02-23 LAB — APTT: aPTT: 37 seconds (ref 24–37)

## 2015-02-23 LAB — TROPONIN I

## 2015-02-23 LAB — MAGNESIUM
MAGNESIUM: 2.1 mg/dL (ref 1.7–2.4)
Magnesium: 2 mg/dL (ref 1.7–2.4)

## 2015-02-23 LAB — MRSA PCR SCREENING: MRSA by PCR: NEGATIVE

## 2015-02-23 LAB — T4, FREE: Free T4: 0.94 ng/dL (ref 0.61–1.12)

## 2015-02-23 MED ORDER — LEVOTHYROXINE SODIUM 100 MCG PO TABS
100.0000 ug | ORAL_TABLET | Freq: Every day | ORAL | Status: DC
  Administered 2015-02-24 – 2015-02-25 (×2): 100 ug via ORAL
  Filled 2015-02-23 (×3): qty 1

## 2015-02-23 MED ORDER — ATROPINE SULFATE 0.1 MG/ML IJ SOLN
0.5000 mg | Freq: Once | INTRAMUSCULAR | Status: AC
  Administered 2015-02-23: 0.5 mg via INTRAVENOUS
  Filled 2015-02-23: qty 10

## 2015-02-23 MED ORDER — ACETAMINOPHEN 325 MG PO TABS: 650.0000 mg | ORAL_TABLET | ORAL | Status: DC | PRN

## 2015-02-23 MED ORDER — ACETAMINOPHEN 325 MG PO TABS
650.0000 mg | ORAL_TABLET | Freq: Two times a day (BID) | ORAL | Status: DC
  Administered 2015-02-24: 650 mg via ORAL
  Filled 2015-02-23: qty 2

## 2015-02-23 MED ORDER — PANTOPRAZOLE SODIUM 40 MG PO TBEC
40.0000 mg | DELAYED_RELEASE_TABLET | Freq: Every day | ORAL | Status: DC
  Administered 2015-02-24 – 2015-02-25 (×2): 40 mg via ORAL
  Filled 2015-02-23 (×2): qty 1

## 2015-02-23 MED ORDER — SODIUM BICARBONATE 650 MG PO TABS
650.0000 mg | ORAL_TABLET | Freq: Every day | ORAL | Status: DC
  Administered 2015-02-24 – 2015-02-25 (×2): 650 mg via ORAL
  Filled 2015-02-23 (×2): qty 1

## 2015-02-23 MED ORDER — SODIUM CHLORIDE 0.9 % IV SOLN
INTRAVENOUS | Status: DC
  Administered 2015-02-23 – 2015-02-25 (×2): via INTRAVENOUS

## 2015-02-23 MED ORDER — FUROSEMIDE 40 MG PO TABS
40.0000 mg | ORAL_TABLET | Freq: Two times a day (BID) | ORAL | Status: DC
  Administered 2015-02-24 – 2015-02-25 (×3): 40 mg via ORAL
  Filled 2015-02-23 (×4): qty 1

## 2015-02-23 MED ORDER — ALLOPURINOL 100 MG PO TABS
100.0000 mg | ORAL_TABLET | Freq: Every day | ORAL | Status: DC
  Administered 2015-02-24 – 2015-02-25 (×2): 100 mg via ORAL
  Filled 2015-02-23 (×2): qty 1

## 2015-02-23 MED ORDER — ONDANSETRON HCL 4 MG/2ML IJ SOLN: 4.0000 mg | Freq: Four times a day (QID) | INTRAMUSCULAR | Status: DC | PRN

## 2015-02-23 MED ORDER — ALPRAZOLAM 0.25 MG PO TABS
0.2500 mg | ORAL_TABLET | Freq: Two times a day (BID) | ORAL | Status: DC | PRN
  Administered 2015-02-25: 0.25 mg via ORAL
  Filled 2015-02-23: qty 1

## 2015-02-23 NOTE — H&P (Addendum)
Admit date: 02/23/2015 Primary Physician  Michiel Sites, MD Primary Cardiologist  Dr. Herbie Baltimore  CC: Weakness/ bradycardia  HPI: 81 year old female patient of [name] with coronary artery disease, chronic kidney disease with creatinine in the 2.0 range, prior history of generalized weakness and word finding, recent fall in May 2016 with chronic bilateral leg edema who presented to the emergency room after feeling increased generalized weakness over the past 3-4 days. She denies any fevers, chills, vomiting, diarrhea. She is post CABG, statin intolerant with mild ischemic cardio myopathy EF 45-50% on echocardiogram.  Currently she denies any chest pain, she denies syncope. Her heart rate when EMS arrived was approximately 30 bpm. Here in the emergency room she is clearly in sinus bradycardia with 2:1 AV block.  She is currently conversant, not short of breath, no chest pain.  She takes carvedilol 6.25 g twice a day as well as Synthroid.  Her husband has Alzheimer's.  PMH:   Past Medical History  Diagnosis Date  . ST elevation myocardial infarction (STEMI) of inferior wall 05/2011; 03/10/2012    a) 11/'12: 100% RCA, dLM 80% -- POBA of RCA (for planned CABG, not done until re-admission with NSTEMI 1/'13);; b) 8/'13: 100% SVG-RCA (PCI & re-PCI), native RCA 100%; Patent LIMA-p-mLAD, SVG-OM  . CAD S/P percutaneous coronary angioplasty 11/'12; 8/31 & 9/1/'13    a) MV: 100% RCA-POBA, dLM left main 80%; b) 1/'13: NSTEMI --> CABG; c) 2013: 8/31 - 100% RCA & acute SVG-RCA, 100% SVG-OM, 90% LM, 80% p&mLAD, ~70% RI, 50% Cx --> PCI-SVG-RCA: Promus Element DES x 3 (prox 2.5 mm x 38 mm & 2.5 mm x 16 mm, distal 2.5 mm x 16 mm); on 9/1 - accute in-stent Thrombosis - Aspiration thrombectomy & PTCA  . S/P CABG x 4 08/05/11    Dr. Tyrone Sage: LIMA-p-mLAD, SVG-RPDA, SVG-OM; known  100% SVG-OM, Extensive PTCA of SVG-RCA;;  Hospital course complicated by Afib & PNA; after d/c cellulitis of SVG harvest site  due to edema  . Episodic atrial fibrillation 07/2011    Post-Op CABG  . Ischemic cardiomyopathy 03/2012    Echo: EF 45-50% - Mild basal- mid inferolateral Hypokinesis: Gr 1 DD, mildly increased PAP.  Marland Kitchen Dyslipidemia, goal LDL below 70     statin intolerance (lipitor, crestor drug reaction); Welchol & Zetia  . Hypertension, essential   . DM (diabetes mellitus), type 2 with renal complications   . Chronic kidney disease (CKD) stage G3b/A1, moderately decreased glomerular filtration rate (GFR) between 30-44 mL/min/1.73 square meter and albuminuria creatinine ratio less than 30 mg/g   . History of pneumonia     post-op from CABG  . Hypothyroidism     on synthroid  . Headache(784.0)   . Osteoarthritis   . Anxiety     PRN Xanax   . Dermatophytosis of nail   . Gout     on Allopurinol  . Lower extremity edema     chronic    PSH:   Past Surgical History  Procedure Laterality Date  . Cardiac catheterization  November 2012; August and September 2013    Patent LIMA-LAD, patent she had an OM, patent stented SVG-RCA; occluded native RCA, 90% ostial left main, tandem 80% proximal and mid LAD, 70% mid Circumflex  . Coronary angioplasty  November 2012    PTCA only of RCA and setting of inferior STEMI  . Coronary artery bypass graft  08/05/2011    Procedure: CORONARY ARTERY BYPASS GRAFTING (CABG);  Surgeon: [name],  MD;  Location: [facility] OR;  Service: Open Heart Surgery;  Laterality: N/A;  coronary artery bypass graft times 4 using left internal mammary artery and right leg saphenous vein harvested endoscopically  . Joint replacement      both hips replaced  . I&d extremity  09/21/2011    Procedure: IRRIGATION AND DEBRIDEMENT EXTREMITY;  Surgeon: [name], MD;  Location: [facility] OR;  Service: Vascular;  Laterality: Right;  with wound vac placement  . Coronary angioplasty with stent placement  03/10/2012     infferior STEMI: Occluded native RCA and SVG-RCA --> thrombectomy and 3 stent  placement to the SVG-RCA (2.5 mm right 38 mm and 2 proximal distal overlapping 2.5 mm x 16 mm Promus DES stents:  . Coronary angioplasty  03/11/2012    Inferior STEMI #3: Reoccluded SVG-RCA; extensive thrombectomy and post dilation PTCA  . Transthoracic echocardiogram  September 2013    EF 45-50%, mild hypokinesis of the basal and mid inferolateral wall, grade 1 diastolic dysfunction, mildly increased artery pressures.  . Left heart catheterization with coronary angiogram N/A 05/26/2011    Procedure: LEFT HEART CATHETERIZATION WITH CORONARY ANGIOGRAM;  Surgeon: Marykay Lex, MD;  Location: [facility] CATH LAB;  Service: Cardiovascular;  Laterality: N/A;  . Percutaneous coronary intervention-balloon only N/A 05/26/2011    Procedure: PERCUTANEOUS CORONARY INTERVENTION-BALLOON ONLY;  Surgeon: Marykay Lex, MD;  Location: [facility] CATH LAB;  Service: Cardiovascular;  Laterality: N/A;  . Left heart cath N/A 03/10/2012    Procedure: LEFT HEART CATH;  Surgeon: Herby Abraham, MD;  Location: [facility] CATH LAB;  Service: Cardiovascular;  Laterality: N/A;  . Percutaneous coronary stent intervention (pci-s)  03/10/2012    Procedure: PERCUTANEOUS CORONARY STENT INTERVENTION (PCI-S);  Surgeon: Herby Abraham, MD;  Location: [facility] CATH LAB;  Service: Cardiovascular;;  . Left heart catheterization with coronary angiogram Bilateral 03/11/2012    Procedure: LEFT HEART CATHETERIZATION WITH CORONARY ANGIOGRAM;  Surgeon: Thurmon Fair, MD;  Location: [facility] CATH LAB;  Service: Cardiovascular;  Laterality: Bilateral;   Allergies:  Tape and Statins Prior to Admit Meds:   Prior to Admission medications   Medication Sig Start Date End Date Taking? Authorizing Provider  acetaminophen (TYLENOL) 325 MG tablet Take 650 mg by mouth 2 (two) times daily.    Historical Provider, MD  allopurinol (ZYLOPRIM) 100 MG tablet Take 100 mg by mouth daily.     Historical Provider, MD  BRILINTA 90 MG TABS tablet TAKE 1 TABLET (90 MG TOTAL) BY MOUTH 2 (TWO)  TIMES DAILY. 02/03/15   Marykay Lex, MD  carvedilol (COREG) 6.25 MG tablet Take 1 tablet (6.25 mg total) by mouth 2 (two) times daily with a meal. 08/15/14   Marykay Lex, MD  diazepam (VALIUM) 5 MG tablet Take 0.5 tablets (2.5 mg total) by mouth daily. 04/12/14   Oswaldo Conroy, PA-C  furosemide (LASIX) 40 MG tablet Take 40 mg by mouth 2 (two) times daily.    Historical Provider, MD  levothyroxine (SYNTHROID, LEVOTHROID) 100 MCG tablet Take 100 mcg by mouth daily.     Historical Provider, MD  LORazepam (ATIVAN) 0.5 MG tablet Take 0.5 mg by mouth daily.  01/16/13   Historical Provider, MD  pantoprazole (PROTONIX) 40 MG tablet Take 40 mg by mouth daily. 09/24/14   Historical Provider, MD  sodium bicarbonate 650 MG tablet Take 650 mg by mouth daily.      Historical Provider, MD  [facility] 625 MG tablet Take 3,750 mg by mouth daily. 09/02/14  Historical Provider, MD   Fam HX:    Family History  Problem Relation Age of Onset  . Breast cancer Mother   . Heart disease Father    Social HX:    Social History   Social History  . Marital Status: Married    Spouse Name: N/A  . Number of Children: N/A  . Years of Education: N/A   Occupational History  . Not on file.   Social History Main Topics  . Smoking status: Never Smoker   . Smokeless tobacco: Never Used  . Alcohol Use: 0.6 oz/week    1 Glasses of wine per week  . Drug Use: No  . Sexual Activity: No   Other Topics Concern  . 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 She is very active up and around the house, does not do routine exercise.   Does not smoke or drink.     ROS:  All 11 ROS were addressed and are negative except what is stated in the HPI   Physical Exam: Blood pressure 118/46, pulse 31, temperature 96.7 F (35.9 C), temperature source Temporal, resp. rate 17, SpO2 97 %.   General:  Elderly, in no acute distress, complaining of generalized weakness Head: Eyes PERRLA, No xanthomas.   Normal cephalic and atramatic  Lungs:  Clear bilaterally to auscultation and percussion. Normal respiratory effort. No wheezes, no rales. Heart:  Bradycardic, regular Pulses are 2+ & equal. 2/6 systolic left lower sternal border murmurs, rubs or gallops.             No carotid bruit. No JVD.  No abdominal bruits. Abdomen: Bowel sounds are positive, abdomen soft and non-tender without masses or                 Hernia's noted. No hepatosplenomegaly. Msk:  Back normal,  Normal strength and tone for age. Extremities:  No clubbing, cyanosis or edema.  DP +1 Neuro: Alert and oriented X 3, non-focal, MAE x 4 GU: Deferred Rectal: Deferred Psych:  Good affect, responds appropriately         Labs:   Lab Results  Component Value Date   WBC 4.5 02/23/2015   HGB 10.9* 02/23/2015   HCT 32.0* 02/23/2015   MCV 82.3 02/23/2015   PLT PENDING 02/23/2015     Recent Labs Lab 02/23/15 1727  NA 145  K 4.1  CL 114*  BUN 42*  CREATININE 2.50*  GLUCOSE 162*   No results for input(s): CKTOTAL, CKMB, TROPONINI in the last 72 hours. Lab Results  Component Value Date   CHOL 169 07/31/2011   HDL 68 07/31/2011   LDLCALC 88 07/31/2011   TRIG 64 07/31/2011   Lab Results  Component Value Date   DDIMER 0.65* 07/30/2011     Radiology:  Dg Chest Portable 1 View  02/23/2015   CLINICAL DATA:  Weakness. Weakness and bradycardia for 1 day. Initial encounter.  EXAM: PORTABLE CHEST - 1 VIEW  COMPARISON:  07/13/2013.  FINDINGS: Cardiopericardial silhouette is enlarged. Median sternotomy/ CABG. Chronic tortuous calcified thoracic aorta. Monitoring leads project over the chest. Blunting of the RIGHT costophrenic angle is present. This may represent a small pleural effusion. Chronically low lung volumes.  IMPRESSION: Chronic cardiomegaly.  Possible small RIGHT pleural effusion.   Electronically Signed   By:  Andreas Newport M.D.   On: 02/23/2015  17:49   Personally viewed.   EKG:  Poor quality EKG, we will repeat. Telemetry clearly displays 2-1 AV block. Narrow complex QRS. Personally viewed.  ASSESSMENT/PLAN:   81 year old female with symptomatically bradycardia with 2-1 AV block, high degree AV block with coronary disease post bypass, hypothyroidism, chronic kidney disease stage 3-4.  1. Symptomatic bradycardia (2-1 AV block)  - Stop carvedilol 6.25 mg twice a day  - Place on telemetry in step down unit.  - No response to atropine in the emergency room  - Currently mentating well, blood pressure normal  - No urgent need at this point for temporary pacing wire however if clinical situation changes, we may need to place.  - Check TSH, free T4  - Discussed with Dr. Elberta Fortis of electrophysiology for consultation for possible pacemaker placement. Discussed with patient who seems amenable to this plan.  - We will make nothing by mouth past midnight.  2. Coronary artery disease  - Reviewed Dr. Elissa Hefty note  - Inf STEMI #1 -PTCA to RCA (05/2011) + LM 80;NSTEMI (07/2011) - CABG x3; inf STEMI #2 -- SVG-RCA PCI with DES X 3 on 03/10/12; with re-occlusion 24hrs later (inferior STEMI #3) - redo thrombectomy/PTCA (Chronic)  - Dr. Herbie Baltimore was continuing Brilinta secondary to extensive stent in the vein graft. No aspirin.  - I will hold Brilinta in case pacer is needed  - I will order echocardiogram-last one in 2013  - I will check troponin to ensure this is not a manifestation of significant ischemia/worsening RCA disease  3. Generalized weakness  - Worsened by current bradycardia however this has been an ongoing issue even with normal heart rates. She is not able to exercise very much.  4. Hypothyroidism  - Continue with Synthroid  - Check TSH, free T4  - Monitor temperature closely. At this point, she is not showing any manifestations or signs of infection, white count is normal.  5. Chronic  kidney disease stage [name]/4  - I will gently hydrate with 50 mL of normal saline for 12 hours.     Donato Schultz, MD  02/23/2015  5:56 PM

## 2015-02-23 NOTE — ED Notes (Addendum)
Pt called EMS from home for weakness.  EMS states hr between 28-36.  Pt denies nv, chest pain, fevers. CBG 174, bp 136/90, hr 26, rr 16, 99% RA

## 2015-02-23 NOTE — ED Notes (Signed)
Pt HR of 28.  Dr Rubin Payor is aware and he states no further action needed if pt continues to be ao x 4.  Pt continues to be ao x 4.  No further action taken.

## 2015-02-23 NOTE — ED Provider Notes (Signed)
CSN: [id number]     Arrival date & time 02/23/15  1654 History   First MD Initiated Contact with Patient 02/23/15 1659     Chief Complaint  Patient presents with  . Weakness     (Consider location/radiation/quality/duration/timing/severity/associated sxs/prior Treatment) Patient is a 81 y.o. female presenting with weakness. The history is provided by the patient.  Weakness Pertinent negatives include no chest pain, no abdominal pain, no headaches and no shortness of breath.   patient presents with generalized fatigue. Has been going for the last week or 2. No chest pain. No trouble breathing. No fevers or chills. No dysuria. Found to be bradycardic by EMS. No history of bradycardia. No weight loss. Has a history of ischemic cardiomyopathy without current chest pain.  Past Medical History  Diagnosis Date  . ST elevation myocardial infarction (STEMI) of inferior wall 05/2011; 03/10/2012    a) 11/'12: 100% RCA, dLM 80% -- POBA of RCA (for planned CABG, not done until re-admission with NSTEMI 1/'13);; b) 8/'13: 100% SVG-RCA (PCI & re-PCI), native RCA 100%; Patent LIMA-p-mLAD, SVG-OM  . CAD S/P percutaneous coronary angioplasty 11/'12; 8/31 & 9/1/'13    a) MV: 100% RCA-POBA, dLM left main 80%; b) 1/'13: NSTEMI --> CABG; c) 2013: 8/31 - 100% RCA & acute SVG-RCA, 100% SVG-OM, 90% LM, 80% p&mLAD, ~70% RI, 50% Cx --> PCI-SVG-RCA: Promus Element DES x 3 (prox 2.5 mm x 38 mm & 2.5 mm x 16 mm, distal 2.5 mm x 16 mm); on 9/1 - accute in-stent Thrombosis - Aspiration thrombectomy & PTCA  . S/P CABG x 4 08/05/11    Dr. Tyrone Sage: LIMA-p-mLAD, SVG-RPDA, SVG-OM; known  100% SVG-OM, Extensive PTCA of SVG-RCA;;  Hospital course complicated by Afib & PNA; after d/c cellulitis of SVG harvest site due to edema  . Episodic atrial fibrillation 07/2011    Post-Op CABG  . Ischemic cardiomyopathy 03/2012    Echo: EF 45-50% - Mild basal- mid inferolateral Hypokinesis: Gr 1 DD, mildly increased PAP.  Marland Kitchen Dyslipidemia, goal  LDL below 70     statin intolerance (lipitor, crestor drug reaction); Welchol & Zetia  . Hypertension, essential   . DM (diabetes mellitus), type 2 with renal complications   . Chronic kidney disease (CKD) stage G3b/A1, moderately decreased glomerular filtration rate (GFR) between 30-44 mL/min/1.73 square meter and albuminuria creatinine ratio less than 30 mg/g   . History of pneumonia     post-op from CABG  . Hypothyroidism     on synthroid  . Headache(784.0)   . Osteoarthritis   . Anxiety     PRN Xanax   . Dermatophytosis of nail   . Gout     on Allopurinol  . Lower extremity edema     chronic   Past Surgical History  Procedure Laterality Date  . Cardiac catheterization  November 2012; August and September 2013    Patent LIMA-LAD, patent she had an OM, patent stented SVG-RCA; occluded native RCA, 90% ostial left main, tandem 80% proximal and mid LAD, 70% mid Circumflex  . Coronary angioplasty  November 2012    PTCA only of RCA and setting of inferior STEMI  . Coronary artery bypass graft  08/05/2011    Procedure: CORONARY ARTERY BYPASS GRAFTING (CABG);  Surgeon: [name], MD;  Location: [facility] OR;  Service: Open Heart Surgery;  Laterality: N/A;  coronary artery bypass graft times 4 using left internal mammary artery and right leg saphenous vein harvested endoscopically  . Joint replacement  both hips replaced  . I&d extremity  09/21/2011    Procedure: IRRIGATION AND DEBRIDEMENT EXTREMITY;  Surgeon: [name], MD;  Location: [facility] OR;  Service: Vascular;  Laterality: Right;  with wound vac placement  . Coronary angioplasty with stent placement  03/10/2012     infferior STEMI: Occluded native RCA and SVG-RCA --> thrombectomy and 3 stent placement to the SVG-RCA (2.5 mm right 38 mm and 2 proximal distal overlapping 2.5 mm x 16 mm Promus DES stents:  . Coronary angioplasty  03/11/2012    Inferior STEMI #3: Reoccluded SVG-RCA; extensive thrombectomy and post dilation PTCA   . Transthoracic echocardiogram  September 2013    EF 45-50%, mild hypokinesis of the basal and mid inferolateral wall, grade 1 diastolic dysfunction, mildly increased artery pressures.  . Left heart catheterization with coronary angiogram N/A 05/26/2011    Procedure: LEFT HEART CATHETERIZATION WITH CORONARY ANGIOGRAM;  Surgeon: Marykay Lex, MD;  Location: [facility] CATH LAB;  Service: Cardiovascular;  Laterality: N/A;  . Percutaneous coronary intervention-balloon only N/A 05/26/2011    Procedure: PERCUTANEOUS CORONARY INTERVENTION-BALLOON ONLY;  Surgeon: Marykay Lex, MD;  Location: [facility] CATH LAB;  Service: Cardiovascular;  Laterality: N/A;  . Left heart cath N/A 03/10/2012    Procedure: LEFT HEART CATH;  Surgeon: Herby Abraham, MD;  Location: [facility] CATH LAB;  Service: Cardiovascular;  Laterality: N/A;  . Percutaneous coronary stent intervention (pci-s)  03/10/2012    Procedure: PERCUTANEOUS CORONARY STENT INTERVENTION (PCI-S);  Surgeon: Herby Abraham, MD;  Location: [facility] CATH LAB;  Service: Cardiovascular;;  . Left heart catheterization with coronary angiogram Bilateral 03/11/2012    Procedure: LEFT HEART CATHETERIZATION WITH CORONARY ANGIOGRAM;  Surgeon: Thurmon Fair, MD;  Location: [facility] CATH LAB;  Service: Cardiovascular;  Laterality: Bilateral;   Family History  Problem Relation Age of Onset  . Breast cancer Mother   . Heart disease Father    Social History  Substance Use Topics  . Smoking status: Never Smoker   . Smokeless tobacco: Never Used  . Alcohol Use: 0.6 oz/week    1 Glasses of wine per week   OB History    No data available     Review of Systems  Constitutional: Positive for appetite change and fatigue. Negative for activity change and unexpected weight change.  Eyes: Negative for pain.  Respiratory: Negative for chest tightness and shortness of breath.   Cardiovascular: Negative for chest pain and leg swelling.  Gastrointestinal: Negative for nausea, vomiting, abdominal  pain and diarrhea.  Genitourinary: Negative for flank pain.  Musculoskeletal: Negative for back pain and neck stiffness.  Skin: Negative for rash.  Neurological: Positive for weakness. Negative for numbness and headaches.  Psychiatric/Behavioral: Negative for behavioral problems.      Allergies  Tape and Statins  Home Medications   Prior to Admission medications   Medication Sig Start Date End Date Taking? Authorizing Provider  acetaminophen (TYLENOL) 325 MG tablet Take 650 mg by mouth 2 (two) times daily.    Historical Provider, MD  allopurinol (ZYLOPRIM) 100 MG tablet Take 100 mg by mouth daily.     Historical Provider, MD  BRILINTA 90 MG TABS tablet TAKE 1 TABLET (90 MG TOTAL) BY MOUTH 2 (TWO) TIMES DAILY. 02/03/15   Marykay Lex, MD  carvedilol (COREG) 6.25 MG tablet Take 1 tablet (6.25 mg total) by mouth 2 (two) times daily with a meal. 08/15/14   Marykay Lex, MD  diazepam (VALIUM) 5 MG tablet Take 0.5 tablets (2.5  mg total) by mouth daily. 04/12/14   Oswaldo Conroy, PA-C  furosemide (LASIX) 40 MG tablet Take 40 mg by mouth 2 (two) times daily.    Historical Provider, MD  levothyroxine (SYNTHROID, LEVOTHROID) 100 MCG tablet Take 100 mcg by mouth daily.     Historical Provider, MD  LORazepam (ATIVAN) 0.5 MG tablet Take 0.5 mg by mouth daily.  01/16/13   Historical Provider, MD  pantoprazole (PROTONIX) 40 MG tablet Take 40 mg by mouth daily. 09/24/14   Historical Provider, MD  sodium bicarbonate 650 MG tablet Take 650 mg by mouth daily.      Historical Provider, MD  [facility] 625 MG tablet Take 3,750 mg by mouth daily. 09/02/14   Historical Provider, MD   BP 114/87 mmHg  Pulse 29  Temp(Src) 94.1 F (34.5 C) (Rectal)  Resp 13  Ht 5\' 1"  (1.549 m)  Wt 138 lb 3.7 oz (62.7 kg)  BMI 26.13 kg/m2  SpO2 92% Physical Exam  Constitutional: She is oriented to person, place, and time. She appears well-developed.  Cardiovascular:  Bradycardia  Pulmonary/Chest: Effort normal and breath  sounds normal.  Abdominal: Soft.  Neurological: She is alert and oriented to person, place, and time.  Skin: Skin is warm.    ED Course  Procedures (including critical care time) Labs Review Labs Reviewed  CBC WITH DIFFERENTIAL/PLATELET - Abnormal; Notable for the following:    RBC 3.50 (*)    Hemoglobin 9.0 (*)    HCT 28.8 (*)    MCH 25.7 (*)    RDW 21.4 (*)    Platelets 112 (*)    All other components within normal limits  COMPREHENSIVE METABOLIC PANEL - Abnormal; Notable for the following:    Chloride 114 (*)    Glucose, Bld 168 (*)    BUN 40 (*)    Creatinine, Ser 2.63 (*)    Calcium 8.8 (*)    Total Protein 5.2 (*)    Albumin 2.8 (*)    ALT 13 (*)    GFR calc non Af Amer 15 (*)    GFR calc Af Amer 17 (*)    All other components within normal limits  PROTIME-INR - Abnormal; Notable for the following:    Prothrombin Time 15.8 (*)    All other components within normal limits  COMPREHENSIVE METABOLIC PANEL - Abnormal; Notable for the following:    Chloride 112 (*)    Glucose, Bld 124 (*)    BUN 39 (*)    Creatinine, Ser 2.53 (*)    Total Protein 5.2 (*)    Albumin 2.8 (*)    Alkaline Phosphatase 131 (*)    GFR calc non Af Amer 16 (*)    GFR calc Af Amer 18 (*)    All other components within normal limits  PROTIME-INR - Abnormal; Notable for the following:    Prothrombin Time 16.0 (*)    All other components within normal limits  GLUCOSE, CAPILLARY - Abnormal; Notable for the following:    Glucose-Capillary 125 (*)    All other components within normal limits  I-STAT CHEM 8, ED - Abnormal; Notable for the following:    Chloride 114 (*)    BUN 42 (*)    Creatinine, Ser 2.50 (*)    Glucose, Bld 162 (*)    Hemoglobin 10.9 (*)    HCT 32.0 (*)    All other components within normal limits  MRSA PCR SCREENING  TSH  MAGNESIUM  APTT  T4, FREE  MAGNESIUM  TROPONIN I  URINALYSIS, ROUTINE W REFLEX MICROSCOPIC (NOT AT [facility])  HEMOGLOBIN A1C  CBC  BASIC METABOLIC  PANEL  TSH  TROPONIN I  TROPONIN I  I-STAT TROPOININ, ED    Imaging Review Dg Chest Portable 1 View  02/23/2015   CLINICAL DATA:  Weakness. Weakness and bradycardia for 1 day. Initial encounter.  EXAM: PORTABLE CHEST - 1 VIEW  COMPARISON:  07/13/2013.  FINDINGS: Cardiopericardial silhouette is enlarged. Median sternotomy/ CABG. Chronic tortuous calcified thoracic aorta. Monitoring leads project over the chest. Blunting of the RIGHT costophrenic angle is present. This may represent a small pleural effusion. Chronically low lung volumes.  IMPRESSION: Chronic cardiomegaly.  Possible small RIGHT pleural effusion.   Electronically Signed   By: Andreas Newport M.D.   On: 02/23/2015 17:49   I, Tibor Lemmons R., personally reviewed and evaluated these images and lab results as part of my medical decision-making.   EKG Interpretation   Date/Time:  Monday February 23 2015 17:00:45 EDT Ventricular Rate:  124 PR Interval:    QRS Duration: 213 QT Interval:  319 QTC Calculation: 458 R Axis:   -2 Text Interpretation:  2 to 1 AV block Nonspecific intraventricular  conduction delay Borderline repol abnrm, inferolateral leads Confirmed by  Rubin Payor  MD, Kathrine Rieves [phone]) on 02/24/2015 12:00:06 AM      MDM   Final diagnoses:  AV block, 2nd degree  Bradycardia    Patient with bradycardia. Found to be in a 2-1 block. Mentation and good blood pressure. Is on Coreg. Seen by cardiology and was admitted to monitor and possible pacemaker placement after chloride gets out of her system.   Benjiman Core, MD 02/24/15 0000

## 2015-02-24 ENCOUNTER — Encounter (HOSPITAL_COMMUNITY)
Admission: EM | Disposition: A | Discharge status: Home Health | Payer: Self-pay | Source: Home / Self Care | Attending: Cardiology

## 2015-02-24 ENCOUNTER — Ambulatory Visit (HOSPITAL_COMMUNITY): Payer: Medicare Other

## 2015-02-24 DIAGNOSIS — I442 Atrioventricular block, complete: Secondary | ICD-10-CM | POA: Insufficient documentation

## 2015-02-24 DIAGNOSIS — R001 Bradycardia, unspecified: Secondary | ICD-10-CM | POA: Insufficient documentation

## 2015-02-24 DIAGNOSIS — I441 Atrioventricular block, second degree: Secondary | ICD-10-CM

## 2015-02-24 HISTORY — PX: EP IMPLANTABLE DEVICE: SHX172B

## 2015-02-24 LAB — CBC
HEMATOCRIT: 26.5 % — AB (ref 36.0–46.0)
HEMOGLOBIN: 8.4 g/dL — AB (ref 12.0–15.0)
MCH: 25.8 pg — AB (ref 26.0–34.0)
MCHC: 31.7 g/dL (ref 30.0–36.0)
MCV: 81.5 fL (ref 78.0–100.0)
Platelets: 116 10*3/uL — ABNORMAL LOW (ref 150–400)
RBC: 3.25 MIL/uL — AB (ref 3.87–5.11)
RDW: 21.2 % — ABNORMAL HIGH (ref 11.5–15.5)
WBC: 3.6 10*3/uL — ABNORMAL LOW (ref 4.0–10.5)

## 2015-02-24 LAB — URINALYSIS, ROUTINE W REFLEX MICROSCOPIC
Bilirubin Urine: NEGATIVE
GLUCOSE, UA: NEGATIVE mg/dL
HGB URINE DIPSTICK: NEGATIVE
Ketones, ur: NEGATIVE mg/dL
Nitrite: NEGATIVE
PH: 5.5 (ref 5.0–8.0)
Protein, ur: NEGATIVE mg/dL
SPECIFIC GRAVITY, URINE: 1.011 (ref 1.005–1.030)
Urobilinogen, UA: 0.2 mg/dL (ref 0.0–1.0)

## 2015-02-24 LAB — BASIC METABOLIC PANEL
ANION GAP: 6 (ref 5–15)
BUN: 37 mg/dL — AB (ref 6–20)
CHLORIDE: 112 mmol/L — AB (ref 101–111)
CO2: 25 mmol/L (ref 22–32)
Calcium: 8.7 mg/dL — ABNORMAL LOW (ref 8.9–10.3)
Creatinine, Ser: 2.6 mg/dL — ABNORMAL HIGH (ref 0.44–1.00)
GFR calc Af Amer: 17 mL/min — ABNORMAL LOW (ref >60)
GFR calc non Af Amer: 15 mL/min — ABNORMAL LOW (ref >60)
GLUCOSE: 97 mg/dL (ref 65–99)
POTASSIUM: 4 mmol/L (ref 3.5–5.1)
Sodium: 143 mmol/L (ref 135–145)

## 2015-02-24 LAB — TROPONIN I: Troponin I: <0.03 ng/mL (ref <0.031)

## 2015-02-24 LAB — GLUCOSE, CAPILLARY
GLUCOSE-CAPILLARY: 86 mg/dL (ref 65–99)
Glucose-Capillary: 91 mg/dL (ref 65–99)
Glucose-Capillary: 93 mg/dL (ref 65–99)

## 2015-02-24 LAB — URINE MICROSCOPIC-ADD ON

## 2015-02-24 LAB — TSH: TSH: 2.231 u[IU]/mL (ref 0.350–4.500)

## 2015-02-24 SURGERY — PACEMAKER IMPLANT

## 2015-02-24 MED ORDER — SODIUM CHLORIDE 0.9 % IR SOLN
80.0000 mg | Status: AC
  Administered 2015-02-24: 80 mg

## 2015-02-24 MED ORDER — LIDOCAINE HCL (PF) 1 % IJ SOLN
INTRAMUSCULAR | Status: AC
  Filled 2015-02-24: qty 60

## 2015-02-24 MED ORDER — FENTANYL CITRATE (PF) 100 MCG/2ML IJ SOLN
INTRAMUSCULAR | Status: AC
  Filled 2015-02-24: qty 4

## 2015-02-24 MED ORDER — LIDOCAINE HCL (PF) 1 % IJ SOLN
INTRAMUSCULAR | Status: DC | PRN
  Administered 2015-02-24: 18:00:00

## 2015-02-24 MED ORDER — ACETAMINOPHEN 325 MG PO TABS: 325.0000 mg | ORAL_TABLET | ORAL | Status: DC | PRN

## 2015-02-24 MED ORDER — SODIUM CHLORIDE 0.9 % IR SOLN
Status: AC
  Filled 2015-02-24: qty 2

## 2015-02-24 MED ORDER — CEFAZOLIN SODIUM-DEXTROSE 2-3 GM-% IV SOLR
2.0000 g | INTRAVENOUS | Status: DC
  Filled 2015-02-24: qty 50

## 2015-02-24 MED ORDER — ONDANSETRON HCL 4 MG/2ML IJ SOLN: 4.0000 mg | Freq: Four times a day (QID) | INTRAMUSCULAR | Status: DC | PRN

## 2015-02-24 MED ORDER — CEFAZOLIN SODIUM-DEXTROSE 2-3 GM-% IV SOLR
INTRAVENOUS | Status: AC
  Filled 2015-02-24: qty 50

## 2015-02-24 MED ORDER — MIDAZOLAM HCL 5 MG/5ML IJ SOLN
INTRAMUSCULAR | Status: AC
  Filled 2015-02-24: qty 25

## 2015-02-24 MED ORDER — SODIUM CHLORIDE 0.9 % IV SOLN: INTRAVENOUS | Status: AC

## 2015-02-24 MED ORDER — CEFAZOLIN SODIUM-DEXTROSE 2-3 GM-% IV SOLR
INTRAVENOUS | Status: DC | PRN
  Administered 2015-02-24: 2 g via INTRAVENOUS

## 2015-02-24 MED ORDER — SODIUM CHLORIDE 0.9 % IV SOLN
INTRAVENOUS | Status: DC
  Administered 2015-02-24: 13:00:00 via INTRAVENOUS

## 2015-02-24 MED ORDER — CHLORHEXIDINE GLUCONATE 4 % EX LIQD
60.0000 mL | Freq: Once | CUTANEOUS | Status: AC
  Filled 2015-02-24: qty 60

## 2015-02-24 MED ORDER — CEFAZOLIN SODIUM 1-5 GM-% IV SOLN
1.0000 g | Freq: Once | INTRAVENOUS | Status: AC
  Administered 2015-02-24: 1 g via INTRAVENOUS
  Filled 2015-02-24 (×2): qty 50

## 2015-02-24 MED ORDER — CHLORHEXIDINE GLUCONATE 4 % EX LIQD
60.0000 mL | Freq: Once | CUTANEOUS | Status: AC
  Administered 2015-02-24: 4 via TOPICAL

## 2015-02-24 MED ORDER — CEFAZOLIN SODIUM 1-5 GM-% IV SOLN
1.0000 g | Freq: Once | INTRAVENOUS | Status: DC
  Filled 2015-02-24: qty 50

## 2015-02-24 MED ORDER — ENSURE ENLIVE PO LIQD
237.0000 mL | Freq: Two times a day (BID) | ORAL | Status: DC
  Administered 2015-02-25: 237 mL via ORAL

## 2015-02-24 SURGICAL SUPPLY — 8 items
CABLE SURGICAL S-101-97-12 (CABLE) ×2 IMPLANT
LEAD CAPSURE NOVUS 45CM (Lead) ×2 IMPLANT
LEAD CAPSURE NOVUS 5076-52CM (Lead) ×2 IMPLANT
PACEMAKER ADAPTA DR ADDRL1 (Pacemaker) IMPLANT
PAD DEFIB LIFELINK (PAD) ×2 IMPLANT
PPM ADAPTA DR ADDRL1 (Pacemaker) ×3 IMPLANT
SHEATH CLASSIC 7F (SHEATH) ×4 IMPLANT
TRAY PACEMAKER INSERTION (CUSTOM PROCEDURE TRAY) ×2 IMPLANT

## 2015-02-24 NOTE — Op Note (Signed)
Procedure report  Procedure performed:  1. Implantation of new dual chamber permanent pacemaker 2. Fluoroscopy 3. Light sedation  Reason for procedure:  Symptomatic bradycardia due to: Second degree atrioventricular block, 2:1  Procedure performed by: Thurmon Fair, MD  Complications: None  Estimated blood loss: <10 mL  Medications administered during procedure: Ancef 2 g intravenously Lidocaine 1% 30 mL locally  Device details: Generator Medtronic Adapta L model ADDRL1 serial number [id number] H Right atrial lead Medtronic [id number] serial number [id number] Right ventricular lead Medtronic [id number] serial number [id number]  Procedure details:  After the risks and benefits of the procedure were discussed the patient provided informed consent and was brought to the cardiac cath lab in the fasting state. The patient was prepped and draped in usual sterile fashion. Local anesthesia with 1% lidocaine was administered to to the left infraclavicular area. A 5-6 cm horizontal incision was made parallel with and 2-3 cm caudal to the left clavicle. Using electrocautery and blunt dissection a prepectoral pocket was created down to the level of the pectoralis major muscle fascia. The pocket was carefully inspected for hemostasis. An antibiotic-soaked sponge was placed in the pocket.  Under fluoroscopic guidance and using the modified Seldinger technique 2 separate venipunctures were performed to access the left subclavian vein. No difficulty was encountered accessing the vein.  Two J-tip guidewires were subsequently exchanged for two 7 French safe sheaths.  Under fluoroscopic guidance the ventricular lead was advanced across the tricuspid valve. Multiple pacing sites were tested due to low sensed voltage. Eventually, a location at the level of the apical right ventricular septum and thet active-fixation helix was deployed. Prominent current of injury was seen. Satisfactory pacing and sensing  parameters were recorded. There was no evidence of diaphragmatic stimulation at maximum device output. The safe sheath was peeled away and the lead was secured in place with 2-0 silk.  In similar fashion the right atrial lead was advanced to the level of the atrial appendage. The active-fixation helix was deployed. There was prominent current of injury. Satisfactory  pacing and sensing parameters were recorded. There was no evidence of diaphragmatic stimulation with pacing at maximum device output. The safe sheath was peeled away and the lead was secured in place with 2-0 silk.  The antibiotic-soaked sponge was removed from the pocket. The pocket was flushed with copious amounts of antibiotic solution. Reinspection showed excellent hemostasis..  The ventricular lead was connected to the generator and appropriate ventricular pacing was seen. Subsequently the atrial lead was also connected. Repeat testing of the lead parameters later showed excellent values.  The entire system was then carefully inserted in the pocket with care been taking that the leads and device assumed a comfortable position without pressure on the incision. Great care was taken that the leads be located deep to the generator. The pocket was then closed in layers using 2 layers of 2-0 Vicryl and cutaneous staples, after which a sterile dressing was applied.  At the end of the procedure the following lead parameters were encountered:  Right atrial lead  sensed P waves 3.5 mV, impedance 607 ohms, threshold 0.8 V at 0.5 ms pulse width.  Right ventricular lead sensed R waves 7.7 mV, impedance 802 ohms, threshold 1.0 V at 0.5 ms pulse width.  Thurmon Fair, MD, [facility] [phone] office [phone] pager

## 2015-02-24 NOTE — Progress Notes (Signed)
  Echocardiogram 2D Echocardiogram has been performed.  [name] 02/24/2015, 12:16 PM

## 2015-02-24 NOTE — Progress Notes (Signed)
Initial Nutrition Assessment  DOCUMENTATION CODES:   Not applicable  INTERVENTION:   Ensure Enlive po BID, each supplement provides 350 kcal and 20 grams of protein  NUTRITION DIAGNOSIS:   Inadequate oral intake related to inability to eat as evidenced by NPO status.  GOAL:   Patient will meet greater than or equal to 90% of their needs  MONITOR:   PO intake, Supplement acceptance, Diet advancement, Labs, Weight trends, Skin, I & O's  REASON FOR ASSESSMENT:   Malnutrition Screening Tool    ASSESSMENT:   81 year old female patient of Dr. Erich [name] with coronary artery disease, chronic kidney disease with creatinine in the 2.0 range, prior history of generalized weakness and word finding, recent fall in May 2016 with chronic bilateral leg edema who presented to the emergency room after feeling increased generalized weakness over the past 3-4 days. She denies any fevers, chills, vomiting, diarrhea. She is post CABG, statin intolerant with mild ischemic cardio myopathy EF 45-50% on echocardiogram.  Pt admitted with symptomatic bradycardia and CAD.   Spoke with RN who reports pt with continued NPO status for PPM implant later today. RN also reports that pt is the primary caregiver of her husband (who has dementia) and has had difficulty with mobility due to knee troubles as of late.   Hx obtained from pt and daughter at bedside. Pt reports ongoing poor appetite and weight loss since 2003 after heart surgery. Typical meal intake is 3 meals per day: Breakfast: eggs, grits, and sausage (prepared by home health aide 3 times per week); Lunch: Meals on Wheels; Dinner: sandwich. Pt daughter reports pt with no difficulty chewing or swallowing foods or changes in foods eaten, other than she has progressively been eating less. Pt reports she was considering trying Ensure and her aide had purchased some for her, but she was hospitalized before she could try it.  Pt reports she weighed over 200#  10 years ago. Reviewed wt hx, which reveals no significant wt changes over the past year.   Nutrition-Focused physical exam completed. Findings are mild fat depletion, mild muscle depletion, and mild edema. It is difficult to determine whether depletion is related to advanced age, limited mobility, or compromised nutritional status.   Pt understanding of NPO status, however, discussed importance of good PO intake to support healing process. Pt is amenable to supplements once diet is advanced.   Labs reviewed.   Diet Order:  Diet NPO time specified Except for: Sips with Meds  Skin:  Reviewed, no issues  Last BM:  02/24/15  Height:   Ht Readings from Last 1 Encounters:  02/23/15 5\' 1"  (1.549 m)    Weight:   Wt Readings from Last 1 Encounters:  02/24/15 144 lb 13.5 oz (65.7 kg)    Ideal Body Weight:  47.7 kg  BMI:  Body mass index is 27.38 kg/(m^2).  Estimated Nutritional Needs:   Kcal:  1650-1850  Protein:  75-85 grams  Fluid:  >1.6 L  EDUCATION NEEDS:   Education needs addressed  [name] Denley A. Mayford [name], RD, LDN, CDE Pager: [phone] After hours Pager: [phone]

## 2015-02-24 NOTE — Consult Note (Signed)
ELECTROPHYSIOLOGY CONSULT NOTE    Patient ID: [name] MRN: [id number], DOB/AGE: Nov 17, 1920 81 y.o.  Admit date: 02/23/2015 Date of Consult: 02/24/2015  Primary Physician: Michiel Sites, MD Primary Cardiologist: Herbie Baltimore  Reason for Consultation: heart block  HPI:  [name] is a 81 y.o. female with a past medical history significant for CAD (s/p CABG), hypertension, diabetes, CKD, and hypothyroidism who developed pre-syncope on the day of admission and called EMS for further evaluation.  On their arrival, she was bradycardic and was brought to [facility] for further evaluation.  She has had intermittent episodic shortness of breath for the last few months. She was seen by Dr Herbie Baltimore 02/11/15 at which time her heart rate was 54.  Last echo 2013 demonstrated EF 45-50%, grade 1 diastolic dysfunction, mild MR.  Repeat echo pending this admission.   Lab work is notable for Hgb 8.4, WBC 3.6, creat 2.6, negative troponin.  She lives at home with her husband who has Alzheimers. She is his primary caregiver and has help to come in 3 times per week for 3 hours a day.  She has trouble walking because of knee pain.  She denies chest pain, palpitations, frank syncope. She was on Coreg 6.25mg  at home with last dose yesterday morning.    EP has been asked to evaluate for treatment options.   Past Medical History  Diagnosis Date  . ST elevation myocardial infarction (STEMI) of inferior wall 05/2011; 03/10/2012    a) 11/'12: 100% RCA, dLM 80% -- POBA of RCA (for planned CABG, not done until re-admission with NSTEMI 1/'13);; b) 8/'13: 100% SVG-RCA (PCI & re-PCI), native RCA 100%; Patent LIMA-p-mLAD, SVG-OM  . CAD S/P percutaneous coronary angioplasty 11/'12; 8/31 & 9/1/'13    a) MV: 100% RCA-POBA, dLM left main 80%; b) 1/'13: NSTEMI --> CABG; c) 2013: 8/31 - 100% RCA & acute SVG-RCA, 100% SVG-OM, 90% LM, 80% p&mLAD, ~70% RI, 50% Cx --> PCI-SVG-RCA: Promus Element DES x 3 (prox 2.5 mm x 38  mm & 2.5 mm x 16 mm, distal 2.5 mm x 16 mm); on 9/1 - accute in-stent Thrombosis - Aspiration thrombectomy & PTCA  . S/P CABG x 4 08/05/11    Dr. Tyrone Sage: LIMA-p-mLAD, SVG-RPDA, SVG-OM; known  100% SVG-OM, Extensive PTCA of SVG-RCA;;  Hospital course complicated by Afib & PNA; after d/c cellulitis of SVG harvest site due to edema  . Episodic atrial fibrillation 07/2011    Post-Op CABG  . Ischemic cardiomyopathy 03/2012    Echo: EF 45-50% - Mild basal- mid inferolateral Hypokinesis: Gr 1 DD, mildly increased PAP.  Marland Kitchen Dyslipidemia, goal LDL below 70     statin intolerance (lipitor, crestor drug reaction); Welchol & Zetia  . Hypertension, essential   . DM (diabetes mellitus), type 2 with renal complications   . Chronic kidney disease (CKD) stage G3b/A1, moderately decreased glomerular filtration rate (GFR) between 30-44 mL/min/1.73 square meter and albuminuria creatinine ratio less than 30 mg/g   . History of pneumonia     post-op from CABG  . Hypothyroidism     on synthroid  . Headache(784.0)   . Osteoarthritis   . Anxiety     PRN Xanax   . Dermatophytosis of nail   . Gout     on Allopurinol  . Lower extremity edema     chronic     Surgical History:  Past Surgical History  Procedure Laterality Date  . Cardiac catheterization  November 2012; August and September 2013  Patent LIMA-LAD, patent she had an OM, patent stented SVG-RCA; occluded native RCA, 90% ostial left main, tandem 80% proximal and mid LAD, 70% mid Circumflex  . Coronary angioplasty  November 2012    PTCA only of RCA and setting of inferior STEMI  . Coronary artery bypass graft  08/05/2011    Procedure: CORONARY ARTERY BYPASS GRAFTING (CABG);  Surgeon: [name], MD;  Location: [facility] OR;  Service: Open Heart Surgery;  Laterality: N/A;  coronary artery bypass graft times 4 using left internal mammary artery and right leg saphenous vein harvested endoscopically  . Joint replacement      both hips replaced  . I&d  extremity  09/21/2011    Procedure: IRRIGATION AND DEBRIDEMENT EXTREMITY;  Surgeon: [name], MD;  Location: [facility] OR;  Service: Vascular;  Laterality: Right;  with wound vac placement  . Coronary angioplasty with stent placement  03/10/2012     infferior STEMI: Occluded native RCA and SVG-RCA --> thrombectomy and 3 stent placement to the SVG-RCA (2.5 mm right 38 mm and 2 proximal distal overlapping 2.5 mm x 16 mm Promus DES stents:  . Coronary angioplasty  03/11/2012    Inferior STEMI #3: Reoccluded SVG-RCA; extensive thrombectomy and post dilation PTCA  . Transthoracic echocardiogram  September 2013    EF 45-50%, mild hypokinesis of the basal and mid inferolateral wall, grade 1 diastolic dysfunction, mildly increased artery pressures.  . Left heart catheterization with coronary angiogram N/A 05/26/2011    Procedure: LEFT HEART CATHETERIZATION WITH CORONARY ANGIOGRAM;  Surgeon: Marykay Lex, MD;  Location: [facility] CATH LAB;  Service: Cardiovascular;  Laterality: N/A;  . Percutaneous coronary intervention-balloon only N/A 05/26/2011    Procedure: PERCUTANEOUS CORONARY INTERVENTION-BALLOON ONLY;  Surgeon: Marykay Lex, MD;  Location: [facility] CATH LAB;  Service: Cardiovascular;  Laterality: N/A;  . Left heart cath N/A 03/10/2012    Procedure: LEFT HEART CATH;  Surgeon: Herby Abraham, MD;  Location: [facility] CATH LAB;  Service: Cardiovascular;  Laterality: N/A;  . Percutaneous coronary stent intervention (pci-s)  03/10/2012    Procedure: PERCUTANEOUS CORONARY STENT INTERVENTION (PCI-S);  Surgeon: Herby Abraham, MD;  Location: [facility] CATH LAB;  Service: Cardiovascular;;  . Left heart catheterization with coronary angiogram Bilateral 03/11/2012    Procedure: LEFT HEART CATHETERIZATION WITH CORONARY ANGIOGRAM;  Surgeon: Thurmon Fair, MD;  Location: [facility] CATH LAB;  Service: Cardiovascular;  Laterality: Bilateral;     Prescriptions prior to admission  Medication Sig Dispense Refill Last Dose  . acetaminophen  (TYLENOL) 325 MG tablet Take 650 mg by mouth 2 (two) times daily.   Taking  . allopurinol (ZYLOPRIM) 100 MG tablet Take 100 mg by mouth daily.    Taking  . BRILINTA 90 MG TABS tablet TAKE 1 TABLET (90 MG TOTAL) BY MOUTH 2 (TWO) TIMES DAILY. 60 tablet 4 Taking  . carvedilol (COREG) 6.25 MG tablet Take 1 tablet (6.25 mg total) by mouth 2 (two) times daily with a meal. 60 tablet 6 Taking  . diazepam (VALIUM) 5 MG tablet Take 0.5 tablets (2.5 mg total) by mouth daily. 5 tablet 0 Taking  . furosemide (LASIX) 40 MG tablet Take 40 mg by mouth 2 (two) times daily.   Taking  . levothyroxine (SYNTHROID, LEVOTHROID) 100 MCG tablet Take 100 mcg by mouth daily.    Taking  . LORazepam (ATIVAN) 0.5 MG tablet Take 0.5 mg by mouth daily.    Taking  . pantoprazole (PROTONIX) 40 MG tablet Take 40 mg by mouth daily.  12 Taking  . sodium bicarbonate 650 MG tablet Take 650 mg by mouth daily.     Taking  . WELCHOL 625 MG tablet Take 3,750 mg by mouth daily.  12 Taking    Inpatient Medications:  . acetaminophen  650 mg Oral BID  . allopurinol  100 mg Oral Daily  . furosemide  40 mg Oral BID  . levothyroxine  100 mcg Oral QAC breakfast  . pantoprazole  40 mg Oral Daily  . sodium bicarbonate  650 mg Oral Daily    Allergies:  Allergies  Allergen Reactions  . Tape     Use paper tape.  . Statins Rash    Social History   Social History  . Marital Status: Married    Spouse Name: N/A  . Number of Children: N/A  . Years of Education: N/A   Occupational History  . Not on file.   Social History Main Topics  . Smoking status: Never Smoker   . Smokeless tobacco: Never Used  . Alcohol Use: 0.6 oz/week    1 Glasses of wine per week  . Drug Use: No  . Sexual Activity: No   Other Topics Concern  . 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 She is very active up and around the  house, does not do routine exercise.   Does not smoke or drink.     Family History  Problem Relation Age of Onset  . Breast cancer Mother   . Heart disease Father      Review of Systems: All other systems reviewed and are otherwise negative except as noted above.  Physical Exam: Filed Vitals:   02/24/15 0300 02/24/15 0414 02/24/15 0500 02/24/15 0700  BP: 105/21  132/29 116/93  Pulse: 31  35 32  Temp:  96.4 F (35.8 C)  98.3 F (36.8 C)  TempSrc:  Axillary  Axillary  Resp: 11  17 15   Height:      Weight:  144 lb 13.5 oz (65.7 kg)    SpO2: 95%  94% 94%    GEN- The patient is elderly appearing, alert and oriented x 3 today.   HEENT: normocephalic, atraumatic; sclera clear, conjunctiva pink; hearing intact; oropharynx clear; neck supple Lungs- Clear to ausculation bilaterally, normal work of breathing.  No wheezes, rales, rhonchi Heart- Bradycardic regular rate and rhythm  GI- soft, non-tender, non-distended, bowel sounds present  Extremities- no clubbing, cyanosis, or edema; DP/PT/radial pulses 2+ bilaterally MS- no significant deformity or atrophy Skin- warm and dry, no rash or lesion Psych- euthymic mood, full affect Neuro- strength and sensation are intact  Labs:   Lab Results  Component Value Date   WBC 3.6* 02/24/2015   HGB 8.4* 02/24/2015   HCT 26.5* 02/24/2015   MCV 81.5 02/24/2015   PLT 116* 02/24/2015    Recent Labs Lab 02/23/15 2213 02/24/15 0305  NA 145 143  K 3.9 4.0  CL 112* 112*  CO2 24 25  BUN 39* 37*  CREATININE 2.53* 2.60*  CALCIUM 9.2 8.7*  PROT 5.2*  --   BILITOT 0.4  --   ALKPHOS 131*  --   ALT 16  --   AST 30  --   GLUCOSE 124* 97      Radiology/Studies: Dg Chest Portable 1 View 02/23/2015   CLINICAL DATA:  Weakness. Weakness and bradycardia for 1 day.  Initial encounter.  EXAM: PORTABLE CHEST - 1 VIEW  COMPARISON:  07/13/2013.  FINDINGS: Cardiopericardial silhouette is enlarged. Median sternotomy/ CABG. Chronic tortuous calcified  thoracic aorta. Monitoring leads project over the chest. Blunting of the RIGHT costophrenic angle is present. This may represent a small pleural effusion. Chronically low lung volumes.  IMPRESSION: Chronic cardiomegaly.  Possible small RIGHT pleural effusion.   Electronically Signed   By: Andreas Newport M.D.   On: 02/23/2015 17:49    WUJ:WJXBJ bradycardia, 2:1 heart block, v rate 37  TELEMETRY: 2:1 heart block  Assessment/Plan: 1.  2:1 heart block The patient has 2:1 heart block associated with pre-syncope.  She has been on low dose Coreg for many years. She will have been >5 half lives later this afternoon.  If no improvement in conduction despite Coreg wash out, would recommend PPM implant for symptomatic bradycardia.  Procedure and rationale discussed with the patient and her daughter today.  They are willing to proceed if conduction does not improve. Will plan PPM implant later this afternoon or tomorrow depending on schedule availability. Will need repeat echo prior to pacemaker implantation  2.  CAD No recent ischemic symptoms Brilinta on hold for possible PPM implant Troponin negative this admission  3.  Hypothyroidism TSH, FT4 normal this admission  4.  CKD Will continue gentle hydration  Dr Johney Frame to see later today  Signed, Gypsy Balsam, NP 02/24/2015 9:14 AM   I have seen and examined the patient along with Gypsy Balsam, NP.  I have reviewed the chart, notes and new data.  I agree with NP's note. Known to me from admission for acute inferior wall MI in 2013.  Key new complaints: comfortable while supine Key examination changes: HR 30, 2:1 AV block on monitor; no overt signs of CHF Key new findings / data: Hgb 8.4, creat 2.6. Quick review of bedside echo shows normal LVEF and no new wall motion abnormalities.  PLAN: Even with further washout of beta blocker (a necessary medication) she may still have significant AV block and symptomatic bradycardia. Note longstanding  history of intermittent intraventricular conduction abnormalities (rate related) and previous wide complex tachycardia. II think she needs a dual chamber PPM. This procedure has been fully reviewed with the patient and her family and written informed consent has been obtained.  Thurmon Fair, MD, [facility] [phone] 02/24/2015, 1:30 PM

## 2015-02-24 NOTE — Progress Notes (Signed)
Back from the EP Lab by bed awake and alert. Pressure dressing to left upper chest intact, arm sling in used, instructed  to avoid moving left arm.

## 2015-02-24 NOTE — Progress Notes (Signed)
Echo . Tech. made aware about the echo to be done prior to  do pacemaker  insertion.

## 2015-02-24 NOTE — Care Management Note (Signed)
Case Management Note  Patient Details  Name: [name] MRN: [id number] Date of Birth: 1921/05/12  Subjective/Objective:        Adm w symptomatic brady            Action/Plan: lives w Melvenia Needles, husband has alzheimers per chart   Expected Discharge Date:  02/25/15               Expected Discharge Plan:  Home w Home Health Services  In-House Referral:     Discharge planning Services     Post Acute Care Choice:    Choice offered to:     DME Arranged:    DME Agency:     HH Arranged:    HH Agency:     Status of Service:     Medicare Important Message Given:    Date Medicare IM Given:    Medicare IM give by:    Date Additional Medicare IM Given:    Additional Medicare Important Message give by:     If discussed at Long Length of Stay Meetings, dates discussed:    Additional Comments: ur review done  Hanley Hays, RN 02/24/2015, 7:40 AM

## 2015-02-25 ENCOUNTER — Encounter (HOSPITAL_COMMUNITY): Payer: Self-pay | Admitting: Cardiovascular Disease

## 2015-02-25 ENCOUNTER — Inpatient Hospital Stay (HOSPITAL_COMMUNITY): Payer: Medicare Other

## 2015-02-25 LAB — GLUCOSE, CAPILLARY
GLUCOSE-CAPILLARY: 108 mg/dL — AB (ref 65–99)
Glucose-Capillary: 82 mg/dL (ref 65–99)

## 2015-02-25 LAB — HEMOGLOBIN A1C
Hgb A1c MFr Bld: 5.9 % — ABNORMAL HIGH (ref 4.8–5.6)
Mean Plasma Glucose: 123 mg/dL

## 2015-02-25 MED ORDER — TICAGRELOR 90 MG PO TABS: ORAL_TABLET | ORAL | Status: DC

## 2015-02-25 NOTE — Progress Notes (Signed)
MD aware about the  result of chest x-ray done this am. Ordered to do x-ray of the left shoulder and pelvis.

## 2015-02-25 NOTE — Discharge Instructions (Signed)
° ° °  Supplemental Discharge Instructions for  Pacemaker/Defibrillator Patients  Activity No heavy lifting or vigorous activity with your left/right arm for 6 to 8 weeks.  Do not raise your left/right arm above your head for one week.  Gradually raise your affected arm as drawn below.           __        03/01/15                 03/02/15                    03/03/15                    03/04/15  NO DRIVING for  1 week   ; you may begin driving on    4/40/10 .  WOUND CARE - Keep the wound area clean and dry.  Do not get this area wet for one week. No showers for one week; you may shower on   03/04/15  . - The tape/steri-strips on your wound will fall off; do not pull them off.  No bandage is needed on the site.  DO  NOT apply any creams, oils, or ointments to the wound area. - If you notice any drainage or discharge from the wound, any swelling or bruising at the site, or you develop a fever > 101? F after you are discharged home, call the office at once.  Special Instructions - You are still able to use cellular telephones; use the ear opposite the side where you have your pacemaker/defibrillator.  Avoid carrying your cellular phone near your device. - When traveling through airports, show security personnel your identification card to avoid being screened in the metal detectors.  Ask the security personnel to use the hand wand. - Avoid arc welding equipment, MRI testing (magnetic resonance imaging), TENS units (transcutaneous nerve stimulators).  Call the office for questions about other devices. - Avoid electrical appliances that are in poor condition or are not properly grounded. - Microwave ovens are safe to be near or to operate.

## 2015-02-25 NOTE — Discharge Summary (Signed)
ELECTROPHYSIOLOGY PROCEDURE DISCHARGE SUMMARY    Patient ID: [name],  MRN: [id number], DOB/AGE: 1921/01/09 81 y.o.  Admit date: 02/23/2015 Discharge date: 02/25/2015  Primary Care Physician: Michiel Sites, MD Primary Cardiologist: Harding/Croitoru  Primary Discharge Diagnosis:  Symptomatic heart block status post pacemaker implantation this admission  Secondary Discharge Diagnosis:  1.  CAD s/p CABG 2.  Diabetes 3.  CKD 4.  Hypothyroidism 5.  Hypertension  Allergies  Allergen Reactions  . Tape     Use paper tape.  . Statins Rash     Procedures This Admission:  1.  Implantation of a MDT dual chamber PPM on 02/24/15 by Dr Royann Shivers.  The patient received a MDT model number ADDRL1 PPM with model number 5076 right atrial lead and 5076 right ventricular lead. There were no immediate post procedure complications. 2.  CXR on 02/26/15 demonstrated no pneumothorax status post device implantation.   Brief HPI/V [name] is a 81 y.o. female with a past medical history as outlined above.  She developed pre-syncope the day of admission and called EMS where she was found to be bradycardic.  She was brought to [facility] where she was found to be in 2:1 heart block.  Her Coreg was held without improvement in conduction.  She was seen by Dr Royann Shivers who recommended pacemaker implantation.  Risks, benefits, and alternatives to PPM implantation were reviewed with the patient who wished to proceed. The patient underwent implantation of a MDT dual chamber pacemaker with details as outlined above.  She was monitored on telemetry overnight which demonstrated sinus rhythm with ventricular pacing.  Left chest was without hematoma or ecchymosis.  The device was interrogated and found to be functioning normally.  CXR was obtained and demonstrated no pneumothorax status post device implantation.  Wound care, arm mobility, and restrictions were reviewed with the patient.  The patient was  examined and considered stable for discharge to home.   Brilinta will be resumed on 03/02/15.  Discussed extensively with Dr Johney Frame - with known previous in stent restenosis, but significantly increased bleeding risks with Brilinta.    CXR showed possible left shoulder fracture. Dedicated shoulder x-ray without fracture.  She was also complaining of left hip pain, pelvic x-ray without abnormality.    Physical Exam: Filed Vitals:   02/25/15 0755 02/25/15 1132 02/25/15 1200 02/25/15 1624  BP: 163/48 169/54 148/44 147/74  Pulse:      Temp: 97.3 F (36.3 C) 97.7 F (36.5 C)  97.5 F (36.4 C)  TempSrc: Oral Oral  Oral  Resp: Height:      Weight:      SpO2: 97% 96% 98% 95%    GEN- The patient is elderly appearing, alert and oriented x 3 today.   HEENT: normocephalic, atraumatic; sclera clear, conjunctiva pink; hearing intact; oropharynx clear; neck supple Lungs- Clear to ausculation bilaterally, normal work of breathing.  No wheezes, rales, rhonchi Heart- Regular rate and rhythm (paced) GI- soft, non-tender, non-distended, bowel sounds present  Extremities- no clubbing, cyanosis, or edema  MS- no significant deformity or atrophy Skin- warm and dry, no rash or lesion, left chest without hematoma/ecchymosis Psych- euthymic mood, full affect Neuro- strength and sensation are intact   Labs:   Lab Results  Component Value Date   WBC 3.6* 02/24/2015   HGB 8.4* 02/24/2015   HCT 26.5* 02/24/2015   MCV 81.5 02/24/2015   PLT 116* 02/24/2015     Recent Labs Lab 02/23/15  2213 02/24/15 0305  NA 145 143  K 3.9 4.0  CL 112* 112*  CO2 24 25  BUN 39* 37*  CREATININE 2.53* 2.60*  CALCIUM 9.2 8.7*  PROT 5.2*  --   BILITOT 0.4  --   ALKPHOS 131*  --   ALT 16  --   AST 30  --   GLUCOSE 124* 97    Discharge Medications:    Medication List    TAKE these medications        acetaminophen 325 MG tablet  Commonly known as:  TYLENOL  Take 650 mg by mouth 2 (two)  times daily.     allopurinol 100 MG tablet  Commonly known as:  ZYLOPRIM  Take 100 mg by mouth daily.     carvedilol 6.25 MG tablet  Commonly known as:  COREG  Take 1 tablet (6.25 mg total) by mouth 2 (two) times daily with a meal.     furosemide 40 MG tablet  Commonly known as:  LASIX  Take 40 mg by mouth daily.     levothyroxine 100 MCG tablet  Commonly known as:  SYNTHROID, LEVOTHROID  Take 100 mcg by mouth daily.     LORazepam 0.5 MG tablet  Commonly known as:  ATIVAN  Take 0.5 mg by mouth 3 (three) times daily.     pantoprazole 40 MG tablet  Commonly known as:  PROTONIX  Take 40 mg by mouth daily.     sodium bicarbonate 650 MG tablet  Take 650 mg by mouth daily.     ticagrelor 90 MG Tabs tablet  Commonly known as:  BRILINTA  TAKE 1 TABLET (90 MG TOTAL) BY MOUTH 2 (TWO) TIMES DAILY. Resume on 03/02/15     WELCHOL 625 MG tablet  Generic drug:  colesevelam  Take 3,750 mg by mouth daily.        Disposition:  Discharge Instructions    Diet - low sodium heart healthy    Complete by:  As directed      Increase activity slowly    Complete by:  As directed           Follow-up Information    Follow up with [facility] On 03/11/2015.   Why:  at 12 noon for wound check   Contact information:   [address] [place] [address]       Follow up with Thurmon Fair, MD On 06/02/2015.   Specialty:  Cardiology   Why:  at [facility] information:   [address] [place] [address] [phone]       Duration of Discharge Encounter: Greater than 30 minutes including physician time.  Signed, Gypsy Balsam, NP 02/25/2015 5:27 PM   Hillis Range MD, [facility] 02/27/2015 9:48 AM

## 2015-02-25 NOTE — Progress Notes (Signed)
Discharged home accompanied by daughter, discharged instructions  And prescription given, belongings with pt.

## 2015-02-26 ENCOUNTER — Telehealth: Payer: Self-pay | Admitting: Cardiology

## 2015-02-26 NOTE — Telephone Encounter (Signed)
New Message        Pt's daughter calling stating that she needs to find out when her mom has to do a remote transmission and how does she send it. Please call back and advise.

## 2015-02-26 NOTE — Telephone Encounter (Signed)
Spoke w/ pt daughter and informed her that pt 1st scheduled remote transmission will be 91 days after 06-02-15 appt. With MD. Pt daughter verbalized understanding. She is also aware that she does not have to send a transmission everyday only when pt is scheduled / pt is not feeling well / or someone at the office ask her to.

## 2015-03-01 ENCOUNTER — Emergency Department (HOSPITAL_COMMUNITY): Payer: Medicare Other

## 2015-03-01 ENCOUNTER — Encounter (HOSPITAL_COMMUNITY): Payer: Self-pay | Admitting: *Deleted

## 2015-03-01 ENCOUNTER — Emergency Department (HOSPITAL_COMMUNITY)
Admission: EM | Admit: 2015-03-01 | Discharge: 2015-03-01 | Disposition: A | Discharge status: Home / Self Care | Payer: Medicare Other | Attending: Emergency Medicine | Admitting: Emergency Medicine

## 2015-03-01 DIAGNOSIS — N183 Chronic kidney disease, stage 3 (moderate): Secondary | ICD-10-CM | POA: Diagnosis not present

## 2015-03-01 DIAGNOSIS — M1990 Unspecified osteoarthritis, unspecified site: Secondary | ICD-10-CM | POA: Diagnosis not present

## 2015-03-01 DIAGNOSIS — E119 Type 2 diabetes mellitus without complications: Secondary | ICD-10-CM | POA: Insufficient documentation

## 2015-03-01 DIAGNOSIS — Z8701 Personal history of pneumonia (recurrent): Secondary | ICD-10-CM | POA: Insufficient documentation

## 2015-03-01 DIAGNOSIS — S199XXA Unspecified injury of neck, initial encounter: Secondary | ICD-10-CM | POA: Diagnosis not present

## 2015-03-01 DIAGNOSIS — I2510 Atherosclerotic heart disease of native coronary artery without angina pectoris: Secondary | ICD-10-CM | POA: Insufficient documentation

## 2015-03-01 DIAGNOSIS — I129 Hypertensive chronic kidney disease with stage 1 through stage 4 chronic kidney disease, or unspecified chronic kidney disease: Secondary | ICD-10-CM | POA: Insufficient documentation

## 2015-03-01 DIAGNOSIS — Y998 Other external cause status: Secondary | ICD-10-CM | POA: Insufficient documentation

## 2015-03-01 DIAGNOSIS — Y9389 Activity, other specified: Secondary | ICD-10-CM | POA: Diagnosis not present

## 2015-03-01 DIAGNOSIS — R079 Chest pain, unspecified: Secondary | ICD-10-CM | POA: Diagnosis not present

## 2015-03-01 DIAGNOSIS — I252 Old myocardial infarction: Secondary | ICD-10-CM | POA: Diagnosis not present

## 2015-03-01 DIAGNOSIS — Y92009 Unspecified place in unspecified non-institutional (private) residence as the place of occurrence of the external cause: Secondary | ICD-10-CM | POA: Insufficient documentation

## 2015-03-01 DIAGNOSIS — Z8659 Personal history of other mental and behavioral disorders: Secondary | ICD-10-CM | POA: Diagnosis not present

## 2015-03-01 DIAGNOSIS — Z951 Presence of aortocoronary bypass graft: Secondary | ICD-10-CM | POA: Diagnosis not present

## 2015-03-01 DIAGNOSIS — S6992XA Unspecified injury of left wrist, hand and finger(s), initial encounter: Secondary | ICD-10-CM | POA: Diagnosis not present

## 2015-03-01 DIAGNOSIS — W1839XA Other fall on same level, initial encounter: Secondary | ICD-10-CM | POA: Insufficient documentation

## 2015-03-01 DIAGNOSIS — S29001A Unspecified injury of muscle and tendon of front wall of thorax, initial encounter: Secondary | ICD-10-CM | POA: Insufficient documentation

## 2015-03-01 DIAGNOSIS — S0990XA Unspecified injury of head, initial encounter: Secondary | ICD-10-CM | POA: Diagnosis not present

## 2015-03-01 DIAGNOSIS — W19XXXA Unspecified fall, initial encounter: Secondary | ICD-10-CM

## 2015-03-01 DIAGNOSIS — R0789 Other chest pain: Secondary | ICD-10-CM | POA: Diagnosis not present

## 2015-03-01 LAB — BRAIN NATRIURETIC PEPTIDE: B Natriuretic Peptide: 224.5 pg/mL — ABNORMAL HIGH (ref 0.0–100.0)

## 2015-03-01 LAB — CBC
HCT: 30.6 % — ABNORMAL LOW (ref 36.0–46.0)
Hemoglobin: 9.6 g/dL — ABNORMAL LOW (ref 12.0–15.0)
MCH: 25.6 pg — ABNORMAL LOW (ref 26.0–34.0)
MCHC: 31.4 g/dL (ref 30.0–36.0)
MCV: 81.6 fL (ref 78.0–100.0)
Platelets: 182 10*3/uL (ref 150–400)
RBC: 3.75 MIL/uL — ABNORMAL LOW (ref 3.87–5.11)
RDW: 20.8 % — ABNORMAL HIGH (ref 11.5–15.5)
WBC: 4.5 10*3/uL (ref 4.0–10.5)

## 2015-03-01 LAB — BASIC METABOLIC PANEL
ANION GAP: 10 (ref 5–15)
BUN: 37 mg/dL — ABNORMAL HIGH (ref 6–20)
CALCIUM: 9.1 mg/dL (ref 8.9–10.3)
CO2: 24 mmol/L (ref 22–32)
Chloride: 111 mmol/L (ref 101–111)
Creatinine, Ser: 1.72 mg/dL — ABNORMAL HIGH (ref 0.44–1.00)
GFR, EST AFRICAN AMERICAN: 29 mL/min — AB (ref >60)
GFR, EST NON AFRICAN AMERICAN: 25 mL/min — AB (ref >60)
Glucose, Bld: 92 mg/dL (ref 65–99)
Potassium: 3.9 mmol/L (ref 3.5–5.1)
Sodium: 145 mmol/L (ref 135–145)

## 2015-03-01 LAB — I-STAT TROPONIN, ED
TROPONIN I, POC: 0.02 ng/mL (ref 0.00–0.08)
Troponin i, poc: 0.02 ng/mL (ref 0.00–0.08)

## 2015-03-01 LAB — URINALYSIS, ROUTINE W REFLEX MICROSCOPIC
BILIRUBIN URINE: NEGATIVE
Glucose, UA: NEGATIVE mg/dL
HGB URINE DIPSTICK: NEGATIVE
KETONES UR: NEGATIVE mg/dL
Leukocytes, UA: NEGATIVE
Nitrite: NEGATIVE
PH: 5 (ref 5.0–8.0)
Protein, ur: NEGATIVE mg/dL
SPECIFIC GRAVITY, URINE: 1.014 (ref 1.005–1.030)
UROBILINOGEN UA: 0.2 mg/dL (ref 0.0–1.0)

## 2015-03-01 NOTE — ED Notes (Signed)
Pt states that she fell yesterday and was on the floor for over 4 hours. EMS came and lifted pt off the floor and checked vital signs with no transport. Pt then began c/o chest pain today.

## 2015-03-01 NOTE — ED Notes (Signed)
Interrogated [employer] pacemaker.  

## 2015-03-01 NOTE — Discharge Instructions (Signed)

## 2015-03-01 NOTE — ED Notes (Signed)
Pt had a pacemaker placed 02/24/15
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
## 2015-03-03 LAB — URINE CULTURE

## 2015-03-09 DIAGNOSIS — H2513 Age-related nuclear cataract, bilateral: Secondary | ICD-10-CM | POA: Diagnosis not present

## 2015-03-11 ENCOUNTER — Ambulatory Visit (INDEPENDENT_AMBULATORY_CARE_PROVIDER_SITE_OTHER): Payer: Medicare Other | Admitting: *Deleted

## 2015-03-11 DIAGNOSIS — I441 Atrioventricular block, second degree: Secondary | ICD-10-CM | POA: Diagnosis not present

## 2015-03-11 DIAGNOSIS — R001 Bradycardia, unspecified: Secondary | ICD-10-CM

## 2015-03-11 LAB — CUP PACEART INCLINIC DEVICE CHECK
Brady Statistic AP VS Percent: 0 %
Brady Statistic AS VS Percent: 0 %
Lead Channel Impedance Value: 604 Ohm
Lead Channel Pacing Threshold Amplitude: 0.5 V
Lead Channel Pacing Threshold Pulse Width: 0.4 ms
Lead Channel Sensing Intrinsic Amplitude: 8 mV
Lead Channel Setting Pacing Amplitude: 3.5 V
MDC IDC MSMT BATTERY IMPEDANCE: 100 Ohm
MDC IDC MSMT BATTERY REMAINING LONGEVITY: 107 mo
MDC IDC MSMT BATTERY VOLTAGE: 2.79 V
MDC IDC MSMT LEADCHNL RA IMPEDANCE VALUE: 411 Ohm
MDC IDC MSMT LEADCHNL RA PACING THRESHOLD AMPLITUDE: 0.75 V
MDC IDC MSMT LEADCHNL RA PACING THRESHOLD PULSEWIDTH: 0.4 ms
MDC IDC MSMT LEADCHNL RA SENSING INTR AMPL: 4 mV
MDC IDC SESS DTM: 20160831122927
MDC IDC SET LEADCHNL RV PACING AMPLITUDE: 3.5 V
MDC IDC SET LEADCHNL RV PACING PULSEWIDTH: 0.4 ms
MDC IDC SET LEADCHNL RV SENSING SENSITIVITY: 4 mV
MDC IDC STAT BRADY AP VP PERCENT: 58 %
MDC IDC STAT BRADY AS VP PERCENT: 41 %

## 2015-03-11 NOTE — Progress Notes (Signed)
Wound check appointment. Staples removed. Wound without redness or edema. Incision edges approximated, wound well healed. Normal device function. Thresholds, sensing, and impedances consistent with implant measurements. Device programmed at 3.5V for extra safety margin until 3 month visit. Histogram distribution appropriate for patient and level of activity. No mode switches or high ventricular rates noted. Patient educated about wound care, arm mobility, lifting restrictions. ROV with [facility] 06/02/15.

## 2015-04-02 DIAGNOSIS — I10 Essential (primary) hypertension: Secondary | ICD-10-CM | POA: Diagnosis not present

## 2015-04-02 DIAGNOSIS — E789 Disorder of lipoprotein metabolism, unspecified: Secondary | ICD-10-CM | POA: Diagnosis not present

## 2015-04-02 DIAGNOSIS — E118 Type 2 diabetes mellitus with unspecified complications: Secondary | ICD-10-CM | POA: Diagnosis not present

## 2015-04-07 DIAGNOSIS — N390 Urinary tract infection, site not specified: Secondary | ICD-10-CM | POA: Diagnosis not present

## 2015-04-07 DIAGNOSIS — E039 Hypothyroidism, unspecified: Secondary | ICD-10-CM | POA: Diagnosis not present

## 2015-04-07 DIAGNOSIS — Z23 Encounter for immunization: Secondary | ICD-10-CM | POA: Diagnosis not present

## 2015-04-14 ENCOUNTER — Telehealth: Payer: Self-pay | Admitting: Cardiovascular Disease

## 2015-04-14 ENCOUNTER — Encounter: Payer: Self-pay | Admitting: Internal Medicine

## 2015-04-14 ENCOUNTER — Ambulatory Visit (INDEPENDENT_AMBULATORY_CARE_PROVIDER_SITE_OTHER): Payer: Medicare Other | Admitting: Internal Medicine

## 2015-04-14 ENCOUNTER — Encounter: Payer: Self-pay | Admitting: Cardiovascular Disease

## 2015-04-14 VITALS — BP 114/74 | HR 62 | Resp 16

## 2015-04-14 DIAGNOSIS — T827XXA Infection and inflammatory reaction due to other cardiac and vascular devices, implants and grafts, initial encounter: Secondary | ICD-10-CM | POA: Diagnosis not present

## 2015-04-14 NOTE — Assessment & Plan Note (Signed)
The patient has developed a PPM pocket infection. She is not toxic appearing and does not appear acutely ill. She has no systemic symptoms. I have expressed approx. 5 cc from the pocket. I will discuss with [name]. She will need removal of the device an insertion of a temp-perm if no escape, followed by anti-biotics and a new device on the contralateral side.

## 2015-04-14 NOTE — Patient Instructions (Signed)
Medication Instructions:  Your physician recommends that you continue on your current medications as directed. Please refer to the Current Medication list given to you today.   Labwork: At the hospital  Testing/Procedures: Pacemaker extraction---04/17/15 at 2pm  Follow-Up: Your physician recommends that you schedule a follow-up appointment in: 10-14 days from discharge from hospital.  That is to be determined   Any Other Special Instructions Will Be Listed Below (If Applicable).

## 2015-04-14 NOTE — Telephone Encounter (Signed)
Received a call from patient's daughter Okey Regal.She stated mother's caregiver just called to report pacemaker incision draining thick dark orange drainage.Pacemaker site red and swollen,warm to touch.Spoke to [place] at pacemaker clinic at [facility], office appointment scheduled today at 12:00 noon.

## 2015-04-14 NOTE — Progress Notes (Signed)
HPI [name] returns today for an unscheduled visit because of PM pocket drainage. She is a pleasant 81 yo woman with CAD, HTN, and symptomatic 2:1 AV block who underwent PPM insertion approx. 6 weeks ago. She has developed drainage around her PPM incision. She denies fever or chills or night sweats but has experienced fatigue. Today she is PPM dependent. No syncope.  Allergies  Allergen Reactions  . Statins Rash  . Tape Rash    Please use paper tape     Current Outpatient Prescriptions  Medication Sig Dispense Refill  . acetaminophen (TYLENOL) 325 MG tablet Take 650 mg by mouth 2 (two) times daily.    Marland Kitchen allopurinol (ZYLOPRIM) 100 MG tablet Take 100 mg by mouth daily.     . carvedilol (COREG) 6.25 MG tablet Take 1 tablet (6.25 mg total) by mouth 2 (two) times daily with a meal. 60 tablet 6  . colesevelam (WELCHOL) 625 MG tablet Take 1,875 mg by mouth See admin instructions. Take 3 tablets (1875 mg) with breakfast and with lunch    . furosemide (LASIX) 40 MG tablet Take 40 mg by mouth daily.     Marland Kitchen levothyroxine (SYNTHROID, LEVOTHROID) 100 MCG tablet Take 100 mcg by mouth daily.     Marland Kitchen LORazepam (ATIVAN) 0.5 MG tablet Take 0.5 mg by mouth 3 (three) times daily.     Marland Kitchen OVER THE COUNTER MEDICATION Place 1 drop into both eyes 3 (three) times daily as needed (dry eyes). Over the counter eye drops for dry eyes    . pantoprazole (PROTONIX) 40 MG tablet Take 40 mg by mouth daily.  12  . sodium bicarbonate 650 MG tablet Take 650 mg by mouth daily.      Marland Kitchen sulfamethoxazole-trimethoprim (BACTRIM DS,SEPTRA DS) 800-160 MG tablet Take 1 tablet by mouth 2 (two) times daily.    . ticagrelor (BRILINTA) 90 MG TABS tablet TAKE 1 TABLET (90 MG TOTAL) BY MOUTH 2 (TWO) TIMES DAILY. Resume on 03/02/15 60 tablet 4   No current facility-administered medications for this visit.     Past Medical History  Diagnosis Date  . ST elevation myocardial infarction (STEMI) of inferior wall 05/2011; 03/10/2012      a) 11/'12: 100% RCA, dLM 80% -- POBA of RCA (for planned CABG, not done until re-admission with NSTEMI 1/'13);; b) 8/'13: 100% SVG-RCA (PCI & re-PCI), native RCA 100%; Patent LIMA-p-mLAD, SVG-OM  . CAD S/P percutaneous coronary angioplasty 11/'12; 8/31 & 9/1/'13    a) MV: 100% RCA-POBA, dLM left main 80%; b) 1/'13: NSTEMI --> CABG; c) 2013: 8/31 - 100% RCA & acute SVG-RCA, 100% SVG-OM, 90% LM, 80% p&mLAD, ~70% RI, 50% Cx --> PCI-SVG-RCA: Promus Element DES x 3 (prox 2.5 mm x 38 mm & 2.5 mm x 16 mm, distal 2.5 mm x 16 mm); on 9/1 - accute in-stent Thrombosis - Aspiration thrombectomy & PTCA  . S/P CABG x 4 08/05/11    Dr. Tyrone Sage: LIMA-p-mLAD, SVG-RPDA, SVG-OM; known  100% SVG-OM, Extensive PTCA of SVG-RCA;;  Hospital course complicated by Afib & PNA; after d/c cellulitis of SVG harvest site due to edema  . Episodic atrial fibrillation 07/2011    Post-Op CABG  . Ischemic cardiomyopathy 03/2012    Echo: EF 45-50% - Mild basal- mid inferolateral Hypokinesis: Gr 1 DD, mildly increased PAP.  Marland Kitchen Dyslipidemia, goal LDL below 70     statin intolerance (lipitor, crestor drug reaction); Welchol & Zetia  . Hypertension, essential   . DM (  diabetes mellitus), type 2 with renal complications   . Chronic kidney disease (CKD) stage G3b/A1, moderately decreased glomerular filtration rate (GFR) between 30-44 mL/min/1.73 square meter and albuminuria creatinine ratio less than 30 mg/g   . History of pneumonia     post-op from CABG  . Hypothyroidism     on synthroid  . Headache(784.0)   . Osteoarthritis   . Anxiety     PRN Xanax   . Dermatophytosis of nail   . Gout     on Allopurinol  . Lower extremity edema     chronic    ROS:   All systems reviewed and negative except as noted in the HPI.   Past Surgical History  Procedure Laterality Date  . Cardiac catheterization  November 2012; August and September 2013    Patent LIMA-LAD, patent she had an OM, patent stented SVG-RCA; occluded native RCA, 90%  ostial left main, tandem 80% proximal and mid LAD, 70% mid Circumflex  . Coronary angioplasty  November 2012    PTCA only of RCA and setting of inferior STEMI  . Coronary artery bypass graft  08/05/2011    Procedure: CORONARY ARTERY BYPASS GRAFTING (CABG);  Surgeon: [name], MD;  Location: [facility] OR;  Service: Open Heart Surgery;  Laterality: N/A;  coronary artery bypass graft times 4 using left internal mammary artery and right leg saphenous vein harvested endoscopically  . Joint replacement      both hips replaced  . I&d extremity  09/21/2011    Procedure: IRRIGATION AND DEBRIDEMENT EXTREMITY;  Surgeon: [name], MD;  Location: [facility] OR;  Service: Vascular;  Laterality: Right;  with wound vac placement  . Coronary angioplasty with stent placement  03/10/2012     infferior STEMI: Occluded native RCA and SVG-RCA --> thrombectomy and 3 stent placement to the SVG-RCA (2.5 mm right 38 mm and 2 proximal distal overlapping 2.5 mm x 16 mm Promus DES stents:  . Coronary angioplasty  03/11/2012    Inferior STEMI #3: Reoccluded SVG-RCA; extensive thrombectomy and post dilation PTCA  . Transthoracic echocardiogram  September 2013    EF 45-50%, mild hypokinesis of the basal and mid inferolateral wall, grade 1 diastolic dysfunction, mildly increased artery pressures.  . Left heart catheterization with coronary angiogram N/A 05/26/2011    Procedure: LEFT HEART CATHETERIZATION WITH CORONARY ANGIOGRAM;  Surgeon: Marykay Lex, MD;  Location: [facility] CATH LAB;  Service: Cardiovascular;  Laterality: N/A;  . Percutaneous coronary intervention-balloon only N/A 05/26/2011    Procedure: PERCUTANEOUS CORONARY INTERVENTION-BALLOON ONLY;  Surgeon: Marykay Lex, MD;  Location: [facility] CATH LAB;  Service: Cardiovascular;  Laterality: N/A;  . Left heart cath N/A 03/10/2012    Procedure: LEFT HEART CATH;  Surgeon: Herby Abraham, MD;  Location: [facility] CATH LAB;  Service: Cardiovascular;  Laterality: N/A;  . Percutaneous  coronary stent intervention (pci-s)  03/10/2012    Procedure: PERCUTANEOUS CORONARY STENT INTERVENTION (PCI-S);  Surgeon: Herby Abraham, MD;  Location: [facility] CATH LAB;  Service: Cardiovascular;;  . Left heart catheterization with coronary angiogram Bilateral 03/11/2012    Procedure: LEFT HEART CATHETERIZATION WITH CORONARY ANGIOGRAM;  Surgeon: Thurmon Fair, MD;  Location: [facility] CATH LAB;  Service: Cardiovascular;  Laterality: Bilateral;  . Ep implantable device N/A 02/24/2015    Procedure: Pacemaker Implant;  Surgeon: Thurmon Fair, MD;  Location: [facility] INVASIVE CV LAB;  Service: Cardiovascular;  Laterality: N/A;     Family History  Problem Relation Age of Onset  . Breast cancer Mother   .  Heart disease Father      Social History   Social History  . Marital Status: Married    Spouse Name: N/A  . Number of Children: N/A  . Years of Education: N/A   Occupational History  . Not on file.   Social History Main Topics  . Smoking status: Never Smoker   . Smokeless tobacco: Never Used  . Alcohol Use: 0.6 oz/week    1 Glasses of wine per week  . Drug Use: No  . Sexual Activity: No   Other Topics Concern  . 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 She is very active up and around the house, does not do routine exercise.   Does not smoke or drink.     BP 114/74 mmHg  Pulse 62  Resp 16  Physical Exam:  elderly appearing 81 yo woman, NAD HEENT: Unremarkable Neck:  6 cm JVD, no thyromegally Back:  No CVA tenderness Lungs:  Clear with no wheezes; PM pocket with purulent drainage from the lateral aspect of the incison. With palpation of the pocket which is swollen, there is additional drainage from the pocket.  HEART:  Regular rate rhythm, no murmurs, no rubs, no clicks Abd:  soft, positive bowel sounds, no organomegally, no rebound, no guarding Ext:   2 plus pulses, no edema, no cyanosis, no clubbing Skin:  No rashes no nodules Neuro:  CN II through XII intact, motor grossly intact   DEVICE  Normal device function.  See PaceArt for details. No escape today.  Assess/Plan:

## 2015-04-15 ENCOUNTER — Telehealth: Payer: Self-pay | Admitting: *Deleted

## 2015-04-15 NOTE — Telephone Encounter (Signed)
Spoke with daughter and let her know the date for her mom's procedure is Fri 04/17/15.  She will have her device extracted.  She is aware to hold her Brilinta starting 04/14/15 and knows to check in at the [facility] Entrance at 12:00pm

## 2015-04-17 ENCOUNTER — Encounter (HOSPITAL_COMMUNITY): Payer: Self-pay

## 2015-04-17 ENCOUNTER — Encounter (HOSPITAL_COMMUNITY)
Admission: RE | Disposition: A | Discharge status: Skilled Nursing Facility | Payer: Medicare Other | Source: Ambulatory Visit | Attending: Internal Medicine

## 2015-04-17 ENCOUNTER — Inpatient Hospital Stay (HOSPITAL_COMMUNITY)
Admission: RE | Admit: 2015-04-17 | Discharge: 2015-04-22 | DRG: 243 | Disposition: A | Discharge status: Skilled Nursing Facility | Payer: Medicare Other | Source: Ambulatory Visit | Attending: Internal Medicine | Admitting: Internal Medicine

## 2015-04-17 DIAGNOSIS — N179 Acute kidney failure, unspecified: Secondary | ICD-10-CM

## 2015-04-17 DIAGNOSIS — I129 Hypertensive chronic kidney disease with stage 1 through stage 4 chronic kidney disease, or unspecified chronic kidney disease: Secondary | ICD-10-CM | POA: Diagnosis present

## 2015-04-17 DIAGNOSIS — F419 Anxiety disorder, unspecified: Secondary | ICD-10-CM | POA: Diagnosis not present

## 2015-04-17 DIAGNOSIS — M109 Gout, unspecified: Secondary | ICD-10-CM | POA: Diagnosis not present

## 2015-04-17 DIAGNOSIS — E039 Hypothyroidism, unspecified: Secondary | ICD-10-CM | POA: Diagnosis not present

## 2015-04-17 DIAGNOSIS — N183 Chronic kidney disease, stage 3 (moderate): Secondary | ICD-10-CM | POA: Diagnosis not present

## 2015-04-17 DIAGNOSIS — Z7902 Long term (current) use of antithrombotics/antiplatelets: Secondary | ICD-10-CM

## 2015-04-17 DIAGNOSIS — Z96643 Presence of artificial hip joint, bilateral: Secondary | ICD-10-CM | POA: Diagnosis not present

## 2015-04-17 DIAGNOSIS — R600 Localized edema: Secondary | ICD-10-CM | POA: Diagnosis present

## 2015-04-17 DIAGNOSIS — I252 Old myocardial infarction: Secondary | ICD-10-CM

## 2015-04-17 DIAGNOSIS — I442 Atrioventricular block, complete: Secondary | ICD-10-CM | POA: Diagnosis present

## 2015-04-17 DIAGNOSIS — E875 Hyperkalemia: Secondary | ICD-10-CM | POA: Diagnosis not present

## 2015-04-17 DIAGNOSIS — Z955 Presence of coronary angioplasty implant and graft: Secondary | ICD-10-CM | POA: Diagnosis not present

## 2015-04-17 DIAGNOSIS — Z950 Presence of cardiac pacemaker: Secondary | ICD-10-CM | POA: Diagnosis not present

## 2015-04-17 DIAGNOSIS — M6281 Muscle weakness (generalized): Secondary | ICD-10-CM | POA: Diagnosis not present

## 2015-04-17 DIAGNOSIS — T827XXA Infection and inflammatory reaction due to other cardiac and vascular devices, implants and grafts, initial encounter: Principal | ICD-10-CM | POA: Diagnosis present

## 2015-04-17 DIAGNOSIS — I2510 Atherosclerotic heart disease of native coronary artery without angina pectoris: Secondary | ICD-10-CM | POA: Diagnosis present

## 2015-04-17 DIAGNOSIS — Y831 Surgical operation with implant of artificial internal device as the cause of abnormal reaction of the patient, or of later complication, without mention of misadventure at the time of the procedure: Secondary | ICD-10-CM | POA: Diagnosis present

## 2015-04-17 DIAGNOSIS — M1990 Unspecified osteoarthritis, unspecified site: Secondary | ICD-10-CM | POA: Diagnosis present

## 2015-04-17 DIAGNOSIS — E785 Hyperlipidemia, unspecified: Secondary | ICD-10-CM | POA: Diagnosis present

## 2015-04-17 DIAGNOSIS — E1122 Type 2 diabetes mellitus with diabetic chronic kidney disease: Secondary | ICD-10-CM | POA: Diagnosis present

## 2015-04-17 DIAGNOSIS — Z951 Presence of aortocoronary bypass graft: Secondary | ICD-10-CM | POA: Diagnosis not present

## 2015-04-17 DIAGNOSIS — I255 Ischemic cardiomyopathy: Secondary | ICD-10-CM | POA: Diagnosis present

## 2015-04-17 DIAGNOSIS — Z79899 Other long term (current) drug therapy: Secondary | ICD-10-CM | POA: Diagnosis not present

## 2015-04-17 DIAGNOSIS — Z48812 Encounter for surgical aftercare following surgery on the circulatory system: Secondary | ICD-10-CM | POA: Diagnosis not present

## 2015-04-17 DIAGNOSIS — T827XXD Infection and inflammatory reaction due to other cardiac and vascular devices, implants and grafts, subsequent encounter: Secondary | ICD-10-CM | POA: Diagnosis not present

## 2015-04-17 HISTORY — PX: CARDIAC CATHETERIZATION: SHX172

## 2015-04-17 HISTORY — PX: EP IMPLANTABLE DEVICE: SHX172B

## 2015-04-17 LAB — CBC
HEMATOCRIT: 30 % — AB (ref 36.0–46.0)
Hemoglobin: 9.5 g/dL — ABNORMAL LOW (ref 12.0–15.0)
MCH: 26 pg (ref 26.0–34.0)
MCHC: 31.7 g/dL (ref 30.0–36.0)
MCV: 82.2 fL (ref 78.0–100.0)
Platelets: 180 10*3/uL (ref 150–400)
RBC: 3.65 MIL/uL — ABNORMAL LOW (ref 3.87–5.11)
RDW: 21.5 % — AB (ref 11.5–15.5)
WBC: 4.6 10*3/uL (ref 4.0–10.5)

## 2015-04-17 LAB — BASIC METABOLIC PANEL
ANION GAP: 9 (ref 5–15)
BUN: 23 mg/dL — ABNORMAL HIGH (ref 6–20)
CALCIUM: 9.8 mg/dL (ref 8.9–10.3)
CO2: 22 mmol/L (ref 22–32)
CREATININE: 2.13 mg/dL — AB (ref 0.44–1.00)
Chloride: 113 mmol/L — ABNORMAL HIGH (ref 101–111)
GFR, EST AFRICAN AMERICAN: 22 mL/min — AB (ref >60)
GFR, EST NON AFRICAN AMERICAN: 19 mL/min — AB (ref >60)
Glucose, Bld: 77 mg/dL (ref 65–99)
Potassium: 6.1 mmol/L (ref 3.5–5.1)
SODIUM: 144 mmol/L (ref 135–145)

## 2015-04-17 LAB — GLUCOSE, CAPILLARY: GLUCOSE-CAPILLARY: 72 mg/dL (ref 65–99)

## 2015-04-17 LAB — SURGICAL PCR SCREEN
MRSA, PCR: NEGATIVE
STAPHYLOCOCCUS AUREUS: NEGATIVE

## 2015-04-17 SURGERY — LEAD EXTRACTION
Anesthesia: LOCAL

## 2015-04-17 MED ORDER — SODIUM BICARBONATE 650 MG PO TABS
650.0000 mg | ORAL_TABLET | Freq: Every day | ORAL | Status: DC
  Administered 2015-04-18 – 2015-04-22 (×5): 650 mg via ORAL
  Filled 2015-04-17 (×5): qty 1

## 2015-04-17 MED ORDER — MUPIROCIN 2 % EX OINT
TOPICAL_OINTMENT | Freq: Two times a day (BID) | CUTANEOUS | Status: DC
  Administered 2015-04-17: 1 via NASAL
  Filled 2015-04-17: qty 22

## 2015-04-17 MED ORDER — LIDOCAINE HCL (PF) 1 % IJ SOLN
INTRAMUSCULAR | Status: DC | PRN
  Administered 2015-04-17: 15 mL

## 2015-04-17 MED ORDER — SODIUM POLYSTYRENE SULFONATE 15 GM/60ML PO SUSP
15.0000 g | ORAL | Status: DC
  Filled 2015-04-17: qty 60

## 2015-04-17 MED ORDER — SULFAMETHOXAZOLE-TRIMETHOPRIM 800-160 MG PO TABS
1.0000 | ORAL_TABLET | Freq: Every day | ORAL | Status: DC
  Administered 2015-04-17 – 2015-04-21 (×5): 1 via ORAL
  Filled 2015-04-17 (×6): qty 1

## 2015-04-17 MED ORDER — CEFAZOLIN SODIUM-DEXTROSE 2-3 GM-% IV SOLR
INTRAVENOUS | Status: AC
  Filled 2015-04-17: qty 50

## 2015-04-17 MED ORDER — LIDOCAINE HCL (PF) 1 % IJ SOLN
INTRAMUSCULAR | Status: AC
  Filled 2015-04-17: qty 30

## 2015-04-17 MED ORDER — SODIUM CHLORIDE 0.9 % IV SOLN
INTRAVENOUS | Status: DC
  Administered 2015-04-17: 13:00:00 via INTRAVENOUS

## 2015-04-17 MED ORDER — LORAZEPAM 0.5 MG PO TABS
0.5000 mg | ORAL_TABLET | Freq: Three times a day (TID) | ORAL | Status: DC
  Administered 2015-04-17 – 2015-04-22 (×14): 0.5 mg via ORAL
  Filled 2015-04-17 (×15): qty 1

## 2015-04-17 MED ORDER — SODIUM CHLORIDE 0.9 % IR SOLN
80.0000 mg | Status: AC
  Administered 2015-04-17: 80 mg
  Filled 2015-04-17: qty 2

## 2015-04-17 MED ORDER — COLESEVELAM HCL 625 MG PO TABS
1875.0000 mg | ORAL_TABLET | Freq: Two times a day (BID) | ORAL | Status: DC
  Administered 2015-04-18 – 2015-04-22 (×8): 1875 mg via ORAL
  Filled 2015-04-17 (×11): qty 3

## 2015-04-17 MED ORDER — MIDAZOLAM HCL 5 MG/5ML IJ SOLN
INTRAMUSCULAR | Status: DC | PRN
  Administered 2015-04-17: 1 mg via INTRAVENOUS

## 2015-04-17 MED ORDER — CEFAZOLIN SODIUM-DEXTROSE 2-3 GM-% IV SOLR
2.0000 g | INTRAVENOUS | Status: AC
  Administered 2015-04-17: 2 g via INTRAVENOUS

## 2015-04-17 MED ORDER — ONDANSETRON HCL 4 MG/2ML IJ SOLN: 4.0000 mg | Freq: Four times a day (QID) | INTRAMUSCULAR | Status: DC | PRN

## 2015-04-17 MED ORDER — SODIUM CHLORIDE 0.9 % IR SOLN
Status: AC
  Filled 2015-04-17: qty 2

## 2015-04-17 MED ORDER — HEPARIN (PORCINE) IN NACL 2-0.9 UNIT/ML-% IJ SOLN
INTRAMUSCULAR | Status: DC | PRN
  Administered 2015-04-17: 16:00:00

## 2015-04-17 MED ORDER — ACETAMINOPHEN 325 MG PO TABS
650.0000 mg | ORAL_TABLET | Freq: Two times a day (BID) | ORAL | Status: DC
  Administered 2015-04-17 – 2015-04-22 (×10): 650 mg via ORAL
  Filled 2015-04-17 (×10): qty 2

## 2015-04-17 MED ORDER — CHLORHEXIDINE GLUCONATE 4 % EX LIQD
60.0000 mL | Freq: Once | CUTANEOUS | Status: DC
  Filled 2015-04-17: qty 60

## 2015-04-17 MED ORDER — ALLOPURINOL 100 MG PO TABS
100.0000 mg | ORAL_TABLET | Freq: Every day | ORAL | Status: DC
  Administered 2015-04-18 – 2015-04-22 (×5): 100 mg via ORAL
  Filled 2015-04-17 (×5): qty 1

## 2015-04-17 MED ORDER — CARVEDILOL 6.25 MG PO TABS
6.2500 mg | ORAL_TABLET | Freq: Two times a day (BID) | ORAL | Status: DC
  Administered 2015-04-17 – 2015-04-22 (×10): 6.25 mg via ORAL
  Filled 2015-04-17 (×11): qty 1

## 2015-04-17 MED ORDER — FUROSEMIDE 40 MG PO TABS
40.0000 mg | ORAL_TABLET | Freq: Every day | ORAL | Status: DC
  Administered 2015-04-17 – 2015-04-22 (×6): 40 mg via ORAL
  Filled 2015-04-17 (×6): qty 1

## 2015-04-17 MED ORDER — MIDAZOLAM HCL 5 MG/5ML IJ SOLN
INTRAMUSCULAR | Status: AC
  Filled 2015-04-17: qty 25

## 2015-04-17 MED ORDER — ENOXAPARIN SODIUM 30 MG/0.3ML ~~LOC~~ SOLN
30.0000 mg | SUBCUTANEOUS | Status: DC
  Administered 2015-04-18 – 2015-04-20 (×3): 30 mg via SUBCUTANEOUS
  Filled 2015-04-17 (×3): qty 0.3

## 2015-04-17 MED ORDER — MUPIROCIN 2 % EX OINT
TOPICAL_OINTMENT | CUTANEOUS | Status: AC
  Administered 2015-04-17: 1 via NASAL
  Filled 2015-04-17: qty 22

## 2015-04-17 MED ORDER — GENTAMICIN SULFATE 40 MG/ML IJ SOLN
INTRAMUSCULAR | Status: DC | PRN
  Administered 2015-04-17: 17:00:00

## 2015-04-17 MED ORDER — FENTANYL CITRATE (PF) 100 MCG/2ML IJ SOLN
INTRAMUSCULAR | Status: AC
  Filled 2015-04-17: qty 4

## 2015-04-17 MED ORDER — ACETAMINOPHEN 325 MG PO TABS: 325.0000 mg | ORAL_TABLET | ORAL | Status: DC | PRN

## 2015-04-17 MED ORDER — CEFAZOLIN SODIUM 1-5 GM-% IV SOLN
1.0000 g | Freq: Four times a day (QID) | INTRAVENOUS | Status: AC
  Administered 2015-04-17 – 2015-04-18 (×3): 1 g via INTRAVENOUS
  Filled 2015-04-17 (×3): qty 50

## 2015-04-17 MED ORDER — LEVOTHYROXINE SODIUM 100 MCG PO TABS
100.0000 ug | ORAL_TABLET | Freq: Every day | ORAL | Status: DC
  Administered 2015-04-18 – 2015-04-22 (×4): 100 ug via ORAL
  Filled 2015-04-17 (×5): qty 1

## 2015-04-17 MED ORDER — PANTOPRAZOLE SODIUM 40 MG PO TBEC
40.0000 mg | DELAYED_RELEASE_TABLET | Freq: Every day | ORAL | Status: DC
  Administered 2015-04-18 – 2015-04-22 (×5): 40 mg via ORAL
  Filled 2015-04-17 (×5): qty 1

## 2015-04-17 SURGICAL SUPPLY — 8 items
CABLE SURGICAL S-101-97-12 (CABLE) ×1 IMPLANT
DILATOR VESSEL 38 20CM 9FR (VASCULAR PRODUCTS) ×1 IMPLANT
LEAD CAPSURE NOVUS 5076-52CM (Lead) ×1 IMPLANT
NEEDLE PERC 18GX4CM (NEEDLE) ×1 IMPLANT
PAD DEFIB LIFELINK (PAD) ×1 IMPLANT
PPM SENSIA SR SESR01 (Pacemaker) ×1 IMPLANT
SHEATH CLASSIC 7F (SHEATH) ×2 IMPLANT
TRAY PACEMAKER INSERTION (CUSTOM PROCEDURE TRAY) ×1 IMPLANT

## 2015-04-17 NOTE — Interval H&P Note (Signed)
History and Physical Interval Note:  04/17/2015 3:44 PM  [name]  has presented today for surgery, with the diagnosis of Infected PPM  The various methods of treatment have been discussed with the patient and family. After consideration of risks, benefits and other options for treatment, the patient has consented to  Procedure(s): Lead Extraction, Can Extraction (N/A) Temporary Pacemaker (N/A) as a surgical intervention .  The patient's history has been reviewed, patient examined, no change in status, stable for surgery.  I have reviewed the patient's chart and labs.  Questions were answered to the patient's satisfaction.     Lewayne Bunting

## 2015-04-17 NOTE — Progress Notes (Signed)
Per Arlys John in cath lab Dr Ladona Ridgel said hold kayexalate and bring to holding area to place on cardiac monitor

## 2015-04-17 NOTE — Progress Notes (Signed)
Pt in cath holding pre procedure. Awake and oriented, no report of pain/problems.

## 2015-04-17 NOTE — H&P (View-Only) (Signed)
HPI [name] returns today for an unscheduled visit because of PM pocket drainage. She is a pleasant 81 yo woman with CAD, HTN, and symptomatic 2:1 AV block who underwent PPM insertion approx. 6 weeks ago. She has developed drainage around her PPM incision. She denies fever or chills or night sweats but has experienced fatigue. Today she is PPM dependent. No syncope.  Allergies  Allergen Reactions  . Statins Rash  . Tape Rash    Please use paper tape     Current Outpatient Prescriptions  Medication Sig Dispense Refill  . acetaminophen (TYLENOL) 325 MG tablet Take 650 mg by mouth 2 (two) times daily.    Marland Kitchen allopurinol (ZYLOPRIM) 100 MG tablet Take 100 mg by mouth daily.     . carvedilol (COREG) 6.25 MG tablet Take 1 tablet (6.25 mg total) by mouth 2 (two) times daily with a meal. 60 tablet 6  . colesevelam (WELCHOL) 625 MG tablet Take 1,875 mg by mouth See admin instructions. Take 3 tablets (1875 mg) with breakfast and with lunch    . furosemide (LASIX) 40 MG tablet Take 40 mg by mouth daily.     Marland Kitchen levothyroxine (SYNTHROID, LEVOTHROID) 100 MCG tablet Take 100 mcg by mouth daily.     Marland Kitchen LORazepam (ATIVAN) 0.5 MG tablet Take 0.5 mg by mouth 3 (three) times daily.     Marland Kitchen OVER THE COUNTER MEDICATION Place 1 drop into both eyes 3 (three) times daily as needed (dry eyes). Over the counter eye drops for dry eyes    . pantoprazole (PROTONIX) 40 MG tablet Take 40 mg by mouth daily.  12  . sodium bicarbonate 650 MG tablet Take 650 mg by mouth daily.      Marland Kitchen sulfamethoxazole-trimethoprim (BACTRIM DS,SEPTRA DS) 800-160 MG tablet Take 1 tablet by mouth 2 (two) times daily.    . ticagrelor (BRILINTA) 90 MG TABS tablet TAKE 1 TABLET (90 MG TOTAL) BY MOUTH 2 (TWO) TIMES DAILY. Resume on 03/02/15 60 tablet 4   No current facility-administered medications for this visit.     Past Medical History  Diagnosis Date  . ST elevation myocardial infarction (STEMI) of inferior wall 05/2011; 03/10/2012      a) 11/'12: 100% RCA, dLM 80% -- POBA of RCA (for planned CABG, not done until re-admission with NSTEMI 1/'13);; b) 8/'13: 100% SVG-RCA (PCI & re-PCI), native RCA 100%; Patent LIMA-p-mLAD, SVG-OM  . CAD S/P percutaneous coronary angioplasty 11/'12; 8/31 & 9/1/'13    a) MV: 100% RCA-POBA, dLM left main 80%; b) 1/'13: NSTEMI --> CABG; c) 2013: 8/31 - 100% RCA & acute SVG-RCA, 100% SVG-OM, 90% LM, 80% p&mLAD, ~70% RI, 50% Cx --> PCI-SVG-RCA: Promus Element DES x 3 (prox 2.5 mm x 38 mm & 2.5 mm x 16 mm, distal 2.5 mm x 16 mm); on 9/1 - accute in-stent Thrombosis - Aspiration thrombectomy & PTCA  . S/P CABG x 4 08/05/11    Dr. Tyrone Sage: LIMA-p-mLAD, SVG-RPDA, SVG-OM; known  100% SVG-OM, Extensive PTCA of SVG-RCA;;  Hospital course complicated by Afib & PNA; after d/c cellulitis of SVG harvest site due to edema  . Episodic atrial fibrillation 07/2011    Post-Op CABG  . Ischemic cardiomyopathy 03/2012    Echo: EF 45-50% - Mild basal- mid inferolateral Hypokinesis: Gr 1 DD, mildly increased PAP.  Marland Kitchen Dyslipidemia, goal LDL below 70     statin intolerance (lipitor, crestor drug reaction); Welchol & Zetia  . Hypertension, essential   . DM (  diabetes mellitus), type 2 with renal complications   . Chronic kidney disease (CKD) stage G3b/A1, moderately decreased glomerular filtration rate (GFR) between 30-44 mL/min/1.73 square meter and albuminuria creatinine ratio less than 30 mg/g   . History of pneumonia     post-op from CABG  . Hypothyroidism     on synthroid  . Headache(784.0)   . Osteoarthritis   . Anxiety     PRN Xanax   . Dermatophytosis of nail   . Gout     on Allopurinol  . Lower extremity edema     chronic    ROS:   All systems reviewed and negative except as noted in the HPI.   Past Surgical History  Procedure Laterality Date  . Cardiac catheterization  November 2012; August and September 2013    Patent LIMA-LAD, patent she had an OM, patent stented SVG-RCA; occluded native RCA, 90%  ostial left main, tandem 80% proximal and mid LAD, 70% mid Circumflex  . Coronary angioplasty  November 2012    PTCA only of RCA and setting of inferior STEMI  . Coronary artery bypass graft  08/05/2011    Procedure: CORONARY ARTERY BYPASS GRAFTING (CABG);  Surgeon: Edward B Gerhardt, MD;  Location: [facility] OR;  Service: Open Heart Surgery;  Laterality: N/A;  coronary artery bypass graft times 4 using left internal mammary artery and right leg saphenous vein harvested endoscopically  . Joint replacement      both hips replaced  . I&d extremity  09/21/2011    Procedure: IRRIGATION AND DEBRIDEMENT EXTREMITY;  Surgeon: Edward B Gerhardt, MD;  Location: [facility] OR;  Service: Vascular;  Laterality: Right;  with wound vac placement  . Coronary angioplasty with stent placement  03/10/2012     infferior STEMI: Occluded native RCA and SVG-RCA --> thrombectomy and 3 stent placement to the SVG-RCA (2.5 mm right 38 mm and 2 proximal distal overlapping 2.5 mm x 16 mm Promus DES stents:  . Coronary angioplasty  03/11/2012    Inferior STEMI #3: Reoccluded SVG-RCA; extensive thrombectomy and post dilation PTCA  . Transthoracic echocardiogram  September 2013    EF 45-50%, mild hypokinesis of the basal and mid inferolateral wall, grade 1 diastolic dysfunction, mildly increased artery pressures.  . Left heart catheterization with coronary angiogram N/A 05/26/2011    Procedure: LEFT HEART CATHETERIZATION WITH CORONARY ANGIOGRAM;  Surgeon: David W Harding, MD;  Location: [facility] CATH LAB;  Service: Cardiovascular;  Laterality: N/A;  . Percutaneous coronary intervention-balloon only N/A 05/26/2011    Procedure: PERCUTANEOUS CORONARY INTERVENTION-BALLOON ONLY;  Surgeon: David W Harding, MD;  Location: [facility] CATH LAB;  Service: Cardiovascular;  Laterality: N/A;  . Left heart cath N/A 03/10/2012    Procedure: LEFT HEART CATH;  Surgeon: Thomas D Stuckey, MD;  Location: [facility] CATH LAB;  Service: Cardiovascular;  Laterality: N/A;  . Percutaneous  coronary stent intervention (pci-s)  03/10/2012    Procedure: PERCUTANEOUS CORONARY STENT INTERVENTION (PCI-S);  Surgeon: Thomas D Stuckey, MD;  Location: [facility] CATH LAB;  Service: Cardiovascular;;  . Left heart catheterization with coronary angiogram Bilateral 03/11/2012    Procedure: LEFT HEART CATHETERIZATION WITH CORONARY ANGIOGRAM;  Surgeon: Mihai Croitoru, MD;  Location: [facility] CATH LAB;  Service: Cardiovascular;  Laterality: Bilateral;  . Ep implantable device N/A 02/24/2015    Procedure: Pacemaker Implant;  Surgeon: Mihai Croitoru, MD;  Location: [facility] INVASIVE CV LAB;  Service: Cardiovascular;  Laterality: N/A;     Family History  Problem Relation Age of Onset  . Breast cancer Mother   .   Heart disease Father      Social History   Social History  . Marital Status: Married    Spouse Name: N/A  . Number of Children: N/A  . Years of Education: N/A   Occupational History  . Not on file.   Social History Main Topics  . Smoking status: Never Smoker   . Smokeless tobacco: Never Used  . Alcohol Use: 0.6 oz/week    1 Glasses of wine per week  . Drug Use: No  . Sexual Activity: No   Other Topics Concern  . 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 She is very active up and around the house, does not do routine exercise.   Does not smoke or drink.     BP 114/74 mmHg  Pulse 62  Resp 16  Physical Exam:  elderly appearing 81 yo woman, NAD HEENT: Unremarkable Neck:  6 cm JVD, no thyromegally Back:  No CVA tenderness Lungs:  Clear with no wheezes; PM pocket with purulent drainage from the lateral aspect of the incison. With palpation of the pocket which is swollen, there is additional drainage from the pocket.  HEART:  Regular rate rhythm, no murmurs, no rubs, no clicks Abd:  soft, positive bowel sounds, no organomegally, no rebound, no guarding Ext:   2 plus pulses, no edema, no cyanosis, no clubbing Skin:  No rashes no nodules Neuro:  CN II through XII intact, motor grossly intact   DEVICE  Normal device function.  See PaceArt for details. No escape today.  Assess/Plan:

## 2015-04-18 ENCOUNTER — Inpatient Hospital Stay (HOSPITAL_COMMUNITY): Payer: Medicare Other

## 2015-04-18 DIAGNOSIS — T827XXD Infection and inflammatory reaction due to other cardiac and vascular devices, implants and grafts, subsequent encounter: Secondary | ICD-10-CM

## 2015-04-18 LAB — BASIC METABOLIC PANEL
Anion gap: 6 (ref 5–15)
BUN: 24 mg/dL — AB (ref 6–20)
CALCIUM: 8.9 mg/dL (ref 8.9–10.3)
CO2: 21 mmol/L — AB (ref 22–32)
CREATININE: 2.69 mg/dL — AB (ref 0.44–1.00)
Chloride: 110 mmol/L (ref 101–111)
GFR calc Af Amer: 17 mL/min — ABNORMAL LOW (ref >60)
GFR, EST NON AFRICAN AMERICAN: 14 mL/min — AB (ref >60)
GLUCOSE: 101 mg/dL — AB (ref 65–99)
Potassium: 5.9 mmol/L — ABNORMAL HIGH (ref 3.5–5.1)
Sodium: 137 mmol/L (ref 135–145)

## 2015-04-18 MED ORDER — CEFAZOLIN SODIUM 1-5 GM-% IV SOLN
1.0000 g | Freq: Two times a day (BID) | INTRAVENOUS | Status: DC
  Administered 2015-04-18 – 2015-04-22 (×7): 1 g via INTRAVENOUS
  Filled 2015-04-18 (×8): qty 50

## 2015-04-18 NOTE — Progress Notes (Signed)
Patient Name: [name]      SUBJECTIVE: Without significant pain but also with very poor understanding as to why she is in the hospital.  Past Medical History  Diagnosis Date  . ST elevation myocardial infarction (STEMI) of inferior wall (HCC) 05/2011; 03/10/2012    a) 11/'12: 100% RCA, dLM 80% -- POBA of RCA (for planned CABG, not done until re-admission with NSTEMI 1/'13);; b) 8/'13: 100% SVG-RCA (PCI & re-PCI), native RCA 100%; Patent LIMA-p-mLAD, SVG-OM  . CAD S/P percutaneous coronary angioplasty 11/'12; 8/31 & 9/1/'13    a) MV: 100% RCA-POBA, dLM left main 80%; b) 1/'13: NSTEMI --> CABG; c) 2013: 8/31 - 100% RCA & acute SVG-RCA, 100% SVG-OM, 90% LM, 80% p&mLAD, ~70% RI, 50% Cx --> PCI-SVG-RCA: Promus Element DES x 3 (prox 2.5 mm x 38 mm & 2.5 mm x 16 mm, distal 2.5 mm x 16 mm); on 9/1 - accute in-stent Thrombosis - Aspiration thrombectomy & PTCA  . S/P CABG x 4 08/05/11    Dr. Tyrone Sage: LIMA-p-mLAD, SVG-RPDA, SVG-OM; known  100% SVG-OM, Extensive PTCA of SVG-RCA;;  Hospital course complicated by Afib & PNA; after d/c cellulitis of SVG harvest site due to edema  . Episodic atrial fibrillation (HCC) 07/2011    Post-Op CABG  . Ischemic cardiomyopathy 03/2012    Echo: EF 45-50% - Mild basal- mid inferolateral Hypokinesis: Gr 1 DD, mildly increased PAP.  Marland Kitchen Dyslipidemia, goal LDL below 70     statin intolerance (lipitor, crestor drug reaction); Welchol & Zetia  . Hypertension, essential   . DM (diabetes mellitus), type 2 with renal complications (HCC)   . Chronic kidney disease (CKD) stage G3b/A1, moderately decreased glomerular filtration rate (GFR) between 30-44 mL/min/1.73 square meter and albuminuria creatinine ratio less than 30 mg/g   . History of pneumonia     post-op from CABG  . Hypothyroidism     on synthroid  . Headache(784.0)   . Osteoarthritis   . Anxiety     PRN Xanax   . Dermatophytosis of nail   . Gout     on Allopurinol  . Lower extremity edema    chronic    Scheduled Meds:  Scheduled Meds: . acetaminophen  650 mg Oral BID  . allopurinol  100 mg Oral Daily  . carvedilol  6.25 mg Oral BID WC  .  ceFAZolin (ANCEF) IV  1 g Intravenous Q6H  . colesevelam  1,875 mg Oral BID WC  . enoxaparin (LOVENOX) injection  30 mg Subcutaneous Q24H  . furosemide  40 mg Oral Daily  . levothyroxine  100 mcg Oral QAC breakfast  . LORazepam  0.5 mg Oral TID  . pantoprazole  40 mg Oral Daily  . sodium bicarbonate  650 mg Oral Daily  . sulfamethoxazole-trimethoprim  1 tablet Oral QHS   Continuous Infusions:  acetaminophen, ondansetron (ZOFRAN) IV    PHYSICAL EXAM Filed Vitals:   04/18/15 0012 04/18/15 0500 04/18/15 0825 04/18/15 0836  BP: 122/43 116/57 104/53   Pulse: 60 59  60  Temp:  97.6 F (36.4 C)  97.8 F (36.6 C)  TempSrc:  Oral  Oral  Resp:  16  16  Height:      Weight:      SpO2: 95% 96%  96%    Well developed and nourished in no acute distress HENT normal Neck supple with JVP-flat Clear Pacemaker pocket bandage not removed but there is some heme discoloration. Regular rate and rhythm,  no murmurs or gallops Abd-soft with active BS No Clubbing cyanosis edema Skin-warm and dry A & Oriented  Grossly normal sensory and motor function   TELEMETRY: Reviewed telemetry pt in ventricular pacing   Intake/Output Summary (Last 24 hours) at 04/18/15 0918 Last data filed at 04/18/15 0700  Gross per 24 hour  Intake    100 ml  Output    400 ml  Net   -300 ml    LABS: Basic Metabolic Panel:  Recent Labs Lab 04/17/15 1244  NA 144  K 6.1*  CL 113*  CO2 22  GLUCOSE 77  BUN 23*  CREATININE 2.13*  CALCIUM 9.8   Cardiac Enzymes: No results for input(s): CKTOTAL, CKMB, CKMBINDEX, TROPONINI in the last 72 hours. CBC:  Recent Labs Lab 04/17/15 1244  WBC 4.6  HGB 9.5*  HCT 30.0*  MCV 82.2  PLT 180   PROTIME: No results for input(s): LABPROT, INR in the last 72 hours. Liver Function Tests: No results for  input(s): AST, ALT, ALKPHOS, BILITOT, PROT, ALBUMIN in the last 72 hours. No results for input(s): LIPASE, AMYLASE in the last 72 hours. BNP: BNP (last 3 results)  Recent Labs  03/01/15 1849  BNP 224.5*    ProBNP (last 3 results) No results for input(s): PROBNP in the last 8760 hours.  D-Dimer: No results for input(s): DDIMER in the last 72 hours. Hemoglobin A1C: No results for input(s): HGBA1C in the last 72 hours. Fasting Lipid Panel: No results for input(s): CHOL, HDL, LDLCALC, TRIG, CHOLHDL, LDLDIRECT in the last 72 hours. Thyroid Function Tests: No results for input(s): TSH, T4TOTAL, T3FREE, THYROIDAB in the last 72 hours.  Invalid input(s): FREET3 Anemia Panel: No results for input(s): VITAMINB12, FOLATE, FERRITIN, TIBC, IRON, RETICCTPCT in the last 72 hours.      ASSESSMENT AND PLAN:  Active Problems:   S/P CABG x 3, LIMA - LAD, SVG-OM, SVG- PDA 08/05/11   Ischemic cardiomyopathy, EF 45-50% 2D 03/13/12   AV block, 3rd degree (HCC)   Pacemaker infection (HCC)  The plan will be to continue her on IV anti-bionics with an anticipation of pacemaker generator reimplantation in the middle of next week.  As noted above, she had little understanding of why she is here.  Signed, Sherryl Manges MD  04/18/2015

## 2015-04-19 DIAGNOSIS — N183 Chronic kidney disease, stage 3 unspecified: Secondary | ICD-10-CM

## 2015-04-19 DIAGNOSIS — N179 Acute kidney failure, unspecified: Secondary | ICD-10-CM

## 2015-04-19 DIAGNOSIS — E875 Hyperkalemia: Secondary | ICD-10-CM

## 2015-04-19 LAB — BASIC METABOLIC PANEL
Anion gap: 8 (ref 5–15)
Anion gap: 9 (ref 5–15)
BUN: 24 mg/dL — AB (ref 6–20)
BUN: 25 mg/dL — ABNORMAL HIGH (ref 6–20)
CHLORIDE: 111 mmol/L (ref 101–111)
CHLORIDE: 110 mmol/L (ref 101–111)
CO2: 19 mmol/L — ABNORMAL LOW (ref 22–32)
CO2: 18 mmol/L — ABNORMAL LOW (ref 22–32)
Calcium: 9.1 mg/dL (ref 8.9–10.3)
Calcium: 9.1 mg/dL (ref 8.9–10.3)
Creatinine, Ser: 2.76 mg/dL — ABNORMAL HIGH (ref 0.44–1.00)
Creatinine, Ser: 2.73 mg/dL — ABNORMAL HIGH (ref 0.44–1.00)
GFR calc non Af Amer: 14 mL/min — ABNORMAL LOW (ref >60)
GFR calc non Af Amer: 14 mL/min — ABNORMAL LOW (ref >60)
GFR, EST AFRICAN AMERICAN: 16 mL/min — AB (ref >60)
GFR, EST AFRICAN AMERICAN: 16 mL/min — AB (ref >60)
Glucose, Bld: 89 mg/dL (ref 65–99)
Glucose, Bld: 87 mg/dL (ref 65–99)
POTASSIUM: 5.9 mmol/L — AB (ref 3.5–5.1)
POTASSIUM: 5.7 mmol/L — AB (ref 3.5–5.1)
SODIUM: 138 mmol/L (ref 135–145)
SODIUM: 137 mmol/L (ref 135–145)

## 2015-04-19 MED ORDER — SODIUM POLYSTYRENE SULFONATE 15 GM/60ML PO SUSP
30.0000 g | Freq: Once | ORAL | Status: AC
  Administered 2015-04-19: 30 g via ORAL
  Filled 2015-04-19: qty 120

## 2015-04-19 NOTE — Progress Notes (Signed)
Patient Name: [name]      SUBJECTIVE: Without significant pain but also with very poor understanding as to why she is in the hospital. Breathing is ok and without pain   Past Medical History  Diagnosis Date  . ST elevation myocardial infarction (STEMI) of inferior wall (HCC) 05/2011; 03/10/2012    a) 11/'12: 100% RCA, dLM 80% -- POBA of RCA (for planned CABG, not done until re-admission with NSTEMI 1/'13);; b) 8/'13: 100% SVG-RCA (PCI & re-PCI), native RCA 100%; Patent LIMA-p-mLAD, SVG-OM  . CAD S/P percutaneous coronary angioplasty 11/'12; 8/31 & 9/1/'13    a) MV: 100% RCA-POBA, dLM left main 80%; b) 1/'13: NSTEMI --> CABG; c) 2013: 8/31 - 100% RCA & acute SVG-RCA, 100% SVG-OM, 90% LM, 80% p&mLAD, ~70% RI, 50% Cx --> PCI-SVG-RCA: Promus Element DES x 3 (prox 2.5 mm x 38 mm & 2.5 mm x 16 mm, distal 2.5 mm x 16 mm); on 9/1 - accute in-stent Thrombosis - Aspiration thrombectomy & PTCA  . S/P CABG x 4 08/05/11    Dr. Tyrone Sage: LIMA-p-mLAD, SVG-RPDA, SVG-OM; known  100% SVG-OM, Extensive PTCA of SVG-RCA;;  Hospital course complicated by Afib & PNA; after d/c cellulitis of SVG harvest site due to edema  . Episodic atrial fibrillation (HCC) 07/2011    Post-Op CABG  . Ischemic cardiomyopathy 03/2012    Echo: EF 45-50% - Mild basal- mid inferolateral Hypokinesis: Gr 1 DD, mildly increased PAP.  Marland Kitchen Dyslipidemia, goal LDL below 70     statin intolerance (lipitor, crestor drug reaction); Welchol & Zetia  . Hypertension, essential   . DM (diabetes mellitus), type 2 with renal complications (HCC)   . Chronic kidney disease (CKD) stage G3b/A1, moderately decreased glomerular filtration rate (GFR) between 30-44 mL/min/1.73 square meter and albuminuria creatinine ratio less than 30 mg/g   . History of pneumonia     post-op from CABG  . Hypothyroidism     on synthroid  . Headache(784.0)   . Osteoarthritis   . Anxiety     PRN Xanax   . Dermatophytosis of nail   . Gout     on  Allopurinol  . Lower extremity edema     chronic    Scheduled Meds:  Scheduled Meds: . acetaminophen  650 mg Oral BID  . allopurinol  100 mg Oral Daily  . carvedilol  6.25 mg Oral BID WC  .  ceFAZolin (ANCEF) IV  1 g Intravenous Q12H  . colesevelam  1,875 mg Oral BID WC  . enoxaparin (LOVENOX) injection  30 mg Subcutaneous Q24H  . furosemide  40 mg Oral Daily  . levothyroxine  100 mcg Oral QAC breakfast  . LORazepam  0.5 mg Oral TID  . pantoprazole  40 mg Oral Daily  . sodium bicarbonate  650 mg Oral Daily  . sulfamethoxazole-trimethoprim  1 tablet Oral QHS   Continuous Infusions:  acetaminophen, ondansetron (ZOFRAN) IV    PHYSICAL EXAM Filed Vitals:   04/18/15 2022 04/19/15 0436 04/19/15 0437 04/19/15 0506  BP: 118/88  96/51   Pulse:      Temp:    97.7 F (36.5 C)  TempSrc:      Resp:      Height:      Weight:    149 lb 3.2 oz (67.677 kg)  SpO2: 95% 96%      Well developed and nourished in no acute distress HENT normal Neck supple with JVP-flat Clear Pacemaker pocket bandage not  removed but there is some heme discoloration. Regular rate and rhythm, no murmurs or gallops Abd-soft with active BS No Clubbing cyanosis tr edema Skin-warm and dry A & Oriented  Grossly normal sensory and motor function   TELEMETRY: Reviewed telemetry pt in ventricular pacing   Intake/Output Summary (Last 24 hours) at 04/19/15 1105 Last data filed at 04/18/15 2200  Gross per 24 hour  Intake    440 ml  Output      0 ml  Net    440 ml    LABS: Basic Metabolic Panel:  Recent Labs Lab 04/17/15 1244 04/18/15 1914 04/19/15 0444  NA 144 137 138  K 6.1* 5.9* 5.9*  CL 113* 110 111  CO2 22 21* 19*  GLUCOSE 77 101* 89  BUN 23* 24* 24*  CREATININE 2.13* 2.69* 2.76*  CALCIUM 9.8 8.9 9.1   Cardiac Enzymes: No results for input(s): CKTOTAL, CKMB, CKMBINDEX, TROPONINI in the last 72 hours. CBC:  Recent Labs Lab 04/17/15 1244  WBC 4.6  HGB 9.5*  HCT 30.0*  MCV 82.2    PLT 180   PROTIME: No results for input(s): LABPROT, INR in the last 72 hours. Liver Function Tests: No results for input(s): AST, ALT, ALKPHOS, BILITOT, PROT, ALBUMIN in the last 72 hours. No results for input(s): LIPASE, AMYLASE in the last 72 hours. BNP: BNP (last 3 results)  Recent Labs  03/01/15 1849  BNP 224.5*    ProBNP (last 3 results) No results for input(s): PROBNP in the last 8760 hours.  D-Dimer: No results for input(s): DDIMER in the last 72 hours. Hemoglobin A1C: No results for input(s): HGBA1C in the last 72 hours. Fasting Lipid Panel: No results for input(s): CHOL, HDL, LDLCALC, TRIG, CHOLHDL, LDLDIRECT in the last 72 hours. Thyroid Function Tests: No results for input(s): TSH, T4TOTAL, T3FREE, THYROIDAB in the last 72 hours.  Invalid input(s): FREET3 Anemia Panel: No results for input(s): VITAMINB12, FOLATE, FERRITIN, TIBC, IRON, RETICCTPCT in the last 72 hours.      ASSESSMENT AND PLAN:  Active Problems:   S/P CABG x 3, LIMA - LAD, SVG-OM, SVG- PDA 08/05/11   Ischemic cardiomyopathy, EF 45-50% 2D 03/13/12   AV block, 3rd degree (HCC)   Pacemaker infection (HCC)   Hyperkalemia   Acute renal failure superimposed on stage 3 chronic kidney disease (HCC)  The plan will be to continue her on IV anti-bionics with an anticipation of pacemaker generator reimplantation in the middle of next week.  Will give Kayexalate and hold diuretics  Signed, Sherryl Manges MD  04/19/2015

## 2015-04-19 NOTE — Progress Notes (Signed)
Utilization Review Completed.[name] T10/03/2015  

## 2015-04-20 ENCOUNTER — Telehealth: Payer: Self-pay | Admitting: Internal Medicine

## 2015-04-20 ENCOUNTER — Encounter (HOSPITAL_COMMUNITY): Payer: Self-pay | Admitting: Internal Medicine

## 2015-04-20 LAB — BASIC METABOLIC PANEL
Anion gap: 11 (ref 5–15)
BUN: 26 mg/dL — ABNORMAL HIGH (ref 6–20)
CALCIUM: 9.4 mg/dL (ref 8.9–10.3)
CO2: 18 mmol/L — ABNORMAL LOW (ref 22–32)
CREATININE: 2.72 mg/dL — AB (ref 0.44–1.00)
Chloride: 113 mmol/L — ABNORMAL HIGH (ref 101–111)
GFR, EST AFRICAN AMERICAN: 17 mL/min — AB (ref >60)
GFR, EST NON AFRICAN AMERICAN: 14 mL/min — AB (ref >60)
Glucose, Bld: 92 mg/dL (ref 65–99)
Potassium: 4.8 mmol/L (ref 3.5–5.1)
SODIUM: 142 mmol/L (ref 135–145)

## 2015-04-20 MED ORDER — CHLORHEXIDINE GLUCONATE 4 % EX LIQD: 60.0000 mL | Freq: Once | CUTANEOUS | Status: DC

## 2015-04-20 MED ORDER — ALPRAZOLAM 0.25 MG PO TABS
0.2500 mg | ORAL_TABLET | Freq: Once | ORAL | Status: AC
  Administered 2015-04-20: 0.25 mg via ORAL
  Filled 2015-04-20: qty 1

## 2015-04-20 MED ORDER — CHLORHEXIDINE GLUCONATE 4 % EX LIQD
60.0000 mL | Freq: Once | CUTANEOUS | Status: AC
  Administered 2015-04-21: 4 via TOPICAL
  Filled 2015-04-20: qty 60

## 2015-04-20 MED ORDER — SODIUM CHLORIDE 0.9 % IV SOLN
INTRAVENOUS | Status: DC
  Administered 2015-04-21: 50 mL/h via INTRAVENOUS
  Administered 2015-04-21: 250 mL via INTRAVENOUS

## 2015-04-20 MED ORDER — CEFAZOLIN SODIUM-DEXTROSE 2-3 GM-% IV SOLR
2.0000 g | INTRAVENOUS | Status: AC
  Administered 2015-04-21: 2 g via INTRAVENOUS
  Filled 2015-04-20: qty 50

## 2015-04-20 MED ORDER — SODIUM CHLORIDE 0.9 % IR SOLN
80.0000 mg | Status: AC
  Administered 2015-04-21: 80 mg
  Filled 2015-04-20: qty 2

## 2015-04-20 NOTE — Progress Notes (Signed)
Dr. Graciela Husbands discussed the case with Dr. Ladona Ridgel, the plan is to proceed with new PPM implant tomorrow since she has already eaten lunch today.  Francis Dowse, PA-C 04/20/2015

## 2015-04-20 NOTE — Telephone Encounter (Signed)
Please call,message was left with answering service. I  Did nit understand the message.

## 2015-04-20 NOTE — Care Management Note (Signed)
Case Management Note  Patient Details  Name: [name] MRN: [id number] Date of Birth: 1921/01/18  Subjective/Objective:       Pacemaker infection       Action/Plan: NCM spoke to pt and gave permission to speak to dtr, Barrett Henle. Dtr state she has POA for pt and makes decisions. Dtr states pt would benefit from SNF-rehab. Message to attending for PT eval and tx when pt medically ready for therapy. Pt scheduled for surgery to replace pacemaker on 04/21/2015. Pt lives at home with her husband. Dtr reports husband is blind. They have aide which is covered out of pocket that comes 3 days for 3 hours. She is looking into getting her parents into a ALF.   Expected Discharge Date:                  Expected Discharge Plan:  Skilled Nursing Facility  In-House Referral:  Clinical Social Work  Discharge planning Services  CM Consult  Post Acute Care Choice:    Choice offered to:     DME Arranged:    DME Agency:     HH Arranged:    HH Agency:     Status of Service:  In process, will continue to follow  Medicare Important Message Given:  Yes-second notification given Date Medicare IM Given:    Medicare IM give by:    Date Additional Medicare IM Given:    Additional Medicare Important Message give by:     If discussed at Long Length of Stay Meetings, dates discussed:    Additional Comments:  Elliot Cousin, RN 04/20/2015, 5:14 PM

## 2015-04-20 NOTE — Care Management Important Message (Signed)
Important Message  Patient Details  Name: [name] MRN: [id number] Date of Birth: May 22, 1921   Medicare Important Message Given:  Yes-second notification given    Kyla Balzarine 04/20/2015, 11:45 AM

## 2015-04-20 NOTE — Telephone Encounter (Signed)
Called phone number, patient's EC, Barrett Henle, is not there. Patient is currently admitted. Talked to patient's caregiver and informed her that I don't think anyone from our office called. She will let patient's EC know.

## 2015-04-20 NOTE — Progress Notes (Signed)
Patient Name: [name]      SUBJECTIVE: Without significant pain but also with very poor understanding as to why she is in the hospital. Breathing is ok and without pain  According to her daughter, she is having some sundowning and hallucinations.  Past Medical History  Diagnosis Date  . ST elevation myocardial infarction (STEMI) of inferior wall (HCC) 05/2011; 03/10/2012    a) 11/'12: 100% RCA, dLM 80% -- POBA of RCA (for planned CABG, not done until re-admission with NSTEMI 1/'13);; b) 8/'13: 100% SVG-RCA (PCI & re-PCI), native RCA 100%; Patent LIMA-p-mLAD, SVG-OM  . CAD S/P percutaneous coronary angioplasty 11/'12; 8/31 & 9/1/'13    a) MV: 100% RCA-POBA, dLM left main 80%; b) 1/'13: NSTEMI --> CABG; c) 2013: 8/31 - 100% RCA & acute SVG-RCA, 100% SVG-OM, 90% LM, 80% p&mLAD, ~70% RI, 50% Cx --> PCI-SVG-RCA: Promus Element DES x 3 (prox 2.5 mm x 38 mm & 2.5 mm x 16 mm, distal 2.5 mm x 16 mm); on 9/1 - accute in-stent Thrombosis - Aspiration thrombectomy & PTCA  . S/P CABG x 4 08/05/11    Dr. Tyrone Sage: LIMA-p-mLAD, SVG-RPDA, SVG-OM; known  100% SVG-OM, Extensive PTCA of SVG-RCA;;  Hospital course complicated by Afib & PNA; after d/c cellulitis of SVG harvest site due to edema  . Episodic atrial fibrillation (HCC) 07/2011    Post-Op CABG  . Ischemic cardiomyopathy 03/2012    Echo: EF 45-50% - Mild basal- mid inferolateral Hypokinesis: Gr 1 DD, mildly increased PAP.  Marland Kitchen Dyslipidemia, goal LDL below 70     statin intolerance (lipitor, crestor drug reaction); Welchol & Zetia  . Hypertension, essential   . DM (diabetes mellitus), type 2 with renal complications (HCC)   . Chronic kidney disease (CKD) stage G3b/A1, moderately decreased glomerular filtration rate (GFR) between 30-44 mL/min/1.73 square meter and albuminuria creatinine ratio less than 30 mg/g   . History of pneumonia     post-op from CABG  . Hypothyroidism     on synthroid  . Headache(784.0)   . Osteoarthritis   .  Anxiety     PRN Xanax   . Dermatophytosis of nail   . Gout     on Allopurinol  . Lower extremity edema     chronic    Scheduled Meds:  Scheduled Meds: . acetaminophen  650 mg Oral BID  . allopurinol  100 mg Oral Daily  . carvedilol  6.25 mg Oral BID WC  .  ceFAZolin (ANCEF) IV  1 g Intravenous Q12H  . colesevelam  1,875 mg Oral BID WC  . enoxaparin (LOVENOX) injection  30 mg Subcutaneous Q24H  . furosemide  40 mg Oral Daily  . levothyroxine  100 mcg Oral QAC breakfast  . LORazepam  0.5 mg Oral TID  . pantoprazole  40 mg Oral Daily  . sodium bicarbonate  650 mg Oral Daily  . sulfamethoxazole-trimethoprim  1 tablet Oral QHS   Continuous Infusions:  acetaminophen, ondansetron (ZOFRAN) IV    PHYSICAL EXAM Filed Vitals:   04/19/15 1422 04/19/15 2108 04/20/15 0559 04/20/15 0744  BP: 125/63 130/68 145/78 152/82  Pulse: 57 58 59   Temp: 97.7 F (36.5 C) 97.6 F (36.4 C) 97.6 F (36.4 C)   TempSrc: Oral Oral Oral   Resp: Height:      Weight:   144 lb 14.4 oz (65.726 kg)   SpO2: 95% 95% 98%     Well developed  and nourished in no acute distress HENT normal Neck supple with JVP-flat Clear Pacemaker pocket bandage not removed but there is some heme discoloration. Regular rate and rhythm, no murmurs or gallops Abd-soft with active BS No Clubbing cyanosis tr edema Skin-warm and dry A & Oriented  Grossly normal sensory and motor function   TELEMETRY: Reviewed telemetry pt in ventricular pacing  No intake or output data in the 24 hours ending 04/20/15 0938  LABS: Basic Metabolic Panel:  Recent Labs Lab 04/17/15 1244 04/18/15 1914 04/19/15 0444 04/19/15 1437 04/20/15 0331  NA 144 137 138 137 142  K 6.1* 5.9* 5.9* 5.7* 4.8  CL 113* 110 111 110 113*  CO2 22 21* 19* 18* 18*  GLUCOSE 77 101* 89 87 92  BUN 23* 24* 24* 25* 26*  CREATININE 2.13* 2.69* 2.76* 2.73* 2.72*  CALCIUM 9.8 8.9 9.1 9.1 9.4   Cardiac Enzymes: No results for input(s): CKTOTAL,  CKMB, CKMBINDEX, TROPONINI in the last 72 hours. CBC:  Recent Labs Lab 04/17/15 1244  WBC 4.6  HGB 9.5*  HCT 30.0*  MCV 82.2  PLT 180   PROTIME: No results for input(s): LABPROT, INR in the last 72 hours. Liver Function Tests: No results for input(s): AST, ALT, ALKPHOS, BILITOT, PROT, ALBUMIN in the last 72 hours. No results for input(s): LIPASE, AMYLASE in the last 72 hours. BNP: BNP (last 3 results)  Recent Labs  03/01/15 1849  BNP 224.5*    ProBNP (last 3 results) No results for input(s): PROBNP in the last 8760 hours.  D-Dimer: No results for input(s): DDIMER in the last 72 hours. Hemoglobin A1C: No results for input(s): HGBA1C in the last 72 hours. Fasting Lipid Panel: No results for input(s): CHOL, HDL, LDLCALC, TRIG, CHOLHDL, LDLDIRECT in the last 72 hours. Thyroid Function Tests: No results for input(s): TSH, T4TOTAL, T3FREE, THYROIDAB in the last 72 hours.  Invalid input(s): FREET3 Anemia Panel: No results for input(s): VITAMINB12, FOLATE, FERRITIN, TIBC, IRON, RETICCTPCT in the last 72 hours.      ASSESSMENT AND PLAN:  Active Problems:   S/P CABG x 3, LIMA - LAD, SVG-OM, SVG- PDA 08/05/11   Ischemic cardiomyopathy, EF 45-50% 2D 03/13/12   AV block, 3rd degree (HCC)   Pacemaker infection (HCC)   Hyperkalemia   Acute renal failure superimposed on stage 3 chronic kidney disease (HCC)  The plan will be to continue her on IV anti-bionics with an anticipation of pacemaker generator reimplantation in the middle of next week.  Hyperkalemia is better abd kayexalaate Renal funcito remains poor but stable with holding of diuretic   I discussed with Dr. Leonia Reeves.  The absence of systemic signs of infection we will proceed with pacemaker generator reimplantation. Unfortunately, as I came by to discuss this with the family she had just finished lunch. We will schedule for tomorrow.  Sherryl Manges MD  04/20/2015

## 2015-04-21 ENCOUNTER — Encounter (HOSPITAL_COMMUNITY): Payer: Self-pay | Admitting: Internal Medicine

## 2015-04-21 ENCOUNTER — Encounter (HOSPITAL_COMMUNITY)
Admission: RE | Disposition: A | Discharge status: Skilled Nursing Facility | Payer: Self-pay | Source: Ambulatory Visit | Attending: Internal Medicine

## 2015-04-21 DIAGNOSIS — I442 Atrioventricular block, complete: Secondary | ICD-10-CM

## 2015-04-21 HISTORY — PX: PACEMAKER LEAD REMOVAL: SHX5064

## 2015-04-21 HISTORY — PX: EP IMPLANTABLE DEVICE: SHX172B

## 2015-04-21 LAB — CBC
HCT: 25.9 % — ABNORMAL LOW (ref 36.0–46.0)
Hemoglobin: 8.3 g/dL — ABNORMAL LOW (ref 12.0–15.0)
MCH: 26.1 pg (ref 26.0–34.0)
MCHC: 32 g/dL (ref 30.0–36.0)
MCV: 81.4 fL (ref 78.0–100.0)
PLATELETS: 130 10*3/uL — AB (ref 150–400)
RBC: 3.18 MIL/uL — ABNORMAL LOW (ref 3.87–5.11)
RDW: 21.7 % — AB (ref 11.5–15.5)
WBC: 4 10*3/uL (ref 4.0–10.5)

## 2015-04-21 LAB — CREATININE, SERUM
CREATININE: 2.49 mg/dL — AB (ref 0.44–1.00)
GFR calc Af Amer: 18 mL/min — ABNORMAL LOW (ref >60)
GFR calc non Af Amer: 16 mL/min — ABNORMAL LOW (ref >60)

## 2015-04-21 LAB — BASIC METABOLIC PANEL
Anion gap: 7 (ref 5–15)
BUN: 25 mg/dL — AB (ref 6–20)
CALCIUM: 9.2 mg/dL (ref 8.9–10.3)
CO2: 22 mmol/L (ref 22–32)
CREATININE: 2.6 mg/dL — AB (ref 0.44–1.00)
Chloride: 114 mmol/L — ABNORMAL HIGH (ref 101–111)
GFR calc Af Amer: 17 mL/min — ABNORMAL LOW (ref >60)
GFR, EST NON AFRICAN AMERICAN: 15 mL/min — AB (ref >60)
GLUCOSE: 79 mg/dL (ref 65–99)
Potassium: 4.4 mmol/L (ref 3.5–5.1)
Sodium: 143 mmol/L (ref 135–145)

## 2015-04-21 SURGERY — PACEMAKER IMPLANT
Anesthesia: LOCAL

## 2015-04-21 MED ORDER — LIDOCAINE HCL (PF) 1 % IJ SOLN
INTRAMUSCULAR | Status: AC
  Filled 2015-04-21: qty 60

## 2015-04-21 MED ORDER — HEPARIN (PORCINE) IN NACL 2-0.9 UNIT/ML-% IJ SOLN
INTRAMUSCULAR | Status: AC
  Filled 2015-04-21: qty 500

## 2015-04-21 MED ORDER — ONDANSETRON HCL 4 MG/2ML IJ SOLN: 4.0000 mg | Freq: Four times a day (QID) | INTRAMUSCULAR | Status: DC | PRN

## 2015-04-21 MED ORDER — MIDAZOLAM HCL 5 MG/5ML IJ SOLN
INTRAMUSCULAR | Status: AC
  Filled 2015-04-21: qty 25

## 2015-04-21 MED ORDER — SODIUM CHLORIDE 0.9 % IV SOLN: INTRAVENOUS | Status: DC

## 2015-04-21 MED ORDER — CEFAZOLIN SODIUM-DEXTROSE 2-3 GM-% IV SOLR
INTRAVENOUS | Status: AC
  Filled 2015-04-21: qty 50

## 2015-04-21 MED ORDER — MIDAZOLAM HCL 5 MG/5ML IJ SOLN
INTRAMUSCULAR | Status: DC | PRN
  Administered 2015-04-21: 1 mg via INTRAVENOUS

## 2015-04-21 MED ORDER — FENTANYL CITRATE (PF) 100 MCG/2ML IJ SOLN
INTRAMUSCULAR | Status: DC | PRN
  Administered 2015-04-21: 12.5 ug via INTRAVENOUS

## 2015-04-21 MED ORDER — HEPARIN SODIUM (PORCINE) 5000 UNIT/ML IJ SOLN
5000.0000 [IU] | Freq: Three times a day (TID) | INTRAMUSCULAR | Status: DC
  Administered 2015-04-21 – 2015-04-22 (×3): 5000 [IU] via SUBCUTANEOUS
  Filled 2015-04-21 (×3): qty 1

## 2015-04-21 MED ORDER — SODIUM CHLORIDE 0.9 % IR SOLN
Status: AC
  Filled 2015-04-21: qty 2

## 2015-04-21 MED ORDER — CEFAZOLIN SODIUM 1-5 GM-% IV SOLN
1.0000 g | Freq: Three times a day (TID) | INTRAVENOUS | Status: DC
  Filled 2015-04-21 (×2): qty 50

## 2015-04-21 MED ORDER — HEPARIN (PORCINE) IN NACL 2-0.9 UNIT/ML-% IJ SOLN
INTRAMUSCULAR | Status: DC | PRN
  Administered 2015-04-21: 11:00:00

## 2015-04-21 MED ORDER — FENTANYL CITRATE (PF) 100 MCG/2ML IJ SOLN
INTRAMUSCULAR | Status: AC
  Filled 2015-04-21: qty 4

## 2015-04-21 MED ORDER — ACETAMINOPHEN 325 MG PO TABS: 325.0000 mg | ORAL_TABLET | ORAL | Status: DC | PRN

## 2015-04-21 MED ORDER — LIDOCAINE HCL (PF) 1 % IJ SOLN
INTRAMUSCULAR | Status: DC | PRN
  Administered 2015-04-21: 11:00:00

## 2015-04-21 MED FILL — Lidocaine HCl Local Preservative Free (PF) Inj 1%: INTRAMUSCULAR | Qty: 30 | Status: AC

## 2015-04-21 SURGICAL SUPPLY — 7 items
CABLE SURGICAL S-101-97-12 (CABLE) ×1 IMPLANT
LEAD CAPSURE NOVUS 45CM (Lead) ×1 IMPLANT
LEAD CAPSURE NOVUS 5076-52CM (Lead) ×1 IMPLANT
PAD DEFIB LIFELINK (PAD) ×1 IMPLANT
PPM SENSIA DR SEDR01 (Pacemaker) ×1 IMPLANT
SHEATH CLASSIC 7F (SHEATH) ×2 IMPLANT
TRAY PACEMAKER INSERTION (CUSTOM PROCEDURE TRAY) ×1 IMPLANT

## 2015-04-21 NOTE — Discharge Summary (Addendum)
ELECTROPHYSIOLOGY PROCEDURE DISCHARGE SUMMARY    Patient ID: [name],  MRN: [id number], DOB/AGE: 07-21-1920 81 y.o.  Admit date: 04/17/2015 Discharge date: 04/22/2015  Primary Care Physician: Michiel Sites, MD Primary Cardiologist: Mike Craze following physician: [place]  Primary Discharge Diagnosis:  Pacemaker pocket infection s/p extraction and insertion of new pacemaker system this admission  Secondary Discharge Diagnosis:  1.  CAD 2.  Hypertension 3.  Mobitz II 4.  ICM   Allergies  Allergen Reactions  . Statins Rash  . Tape Rash    Please use paper tape     Procedures This Admission: 1.  Pacemaker system extraction and insertion of temp-perm pacemaker on 04-17-15 by Dr Ladona Ridgel 2.  Removal of temp perm pacemaker and implantation of a right sided single chamber PPM on 04-21-15 by Dr Ladona Ridgel.  See op note for full details. There were no early apparent complications.  3.  CXR on 04-22-15 demonstrated stable lead position and no ptx  Brief HPI: [name] is a 81 y.o. female with a past medical history as outlined above. She underwent PPM implantation several weeks ago and did well initially but developed purulent drainage from her pacemaker pocket.  She was seen in the office by Dr Ladona Ridgel. Risks, benefits to pacemaker system extraction and reimplantation were reviewed with the patient who wished to proceed.  Hospital Course:  The patient was admitted and underwent pacemaker system extraction with insertion of temp-perm pacemaker. She had several days of IV antibiotics and then underwent reimplantation of a MDT dual chamber pacemaker on 04-21-15 by Dr Ladona Ridgel.  There were no early apparent complications. CXR demonstrated no ptx s/p pacemaker implantation.  She was examined on the day of discharge and considered stable for discharge to home. Right chest was without hematoma/ecchymosis. She will be seen in the clinic with usual follow up.   The  patient's family was concerned about her ability to return home to independent living. She was evaluated by PT and will be discharged to SNF.   Physical Exam: Filed Vitals:   04/21/15 1541 04/21/15 2044 04/22/15 0500 04/22/15 0530  BP: 129/62 115/68 113/54   Pulse: 57 59 74   Temp:  97.9 F (36.6 C) 97.8 F (36.6 C)   TempSrc:  Axillary Oral   Resp: 13 14 17    Height:      Weight:    149 lb 3.2 oz (67.677 kg)  SpO2: 100% 100% 96%     GEN- The patient is elderly appearing, alert and oriented x 3 today.   HEENT: normocephalic, atraumatic; sclera clear, conjunctiva pink; hearing intact; oropharynx clear; neck supple  Lungs- Clear to ausculation bilaterally, normal work of breathing.  No wheezes, rales, rhonchi Heart- Regular rate and rhythm (paced) GI- soft, non-tender, non-distended, bowel sounds present, no hepatosplenomegaly Extremities- no clubbing, cyanosis, or edema; DP/PT/radial pulses 2+ bilaterally MS- age appropriate atrophy Skin- warm and dry, no rash or lesion, right chest without hematoma/ecchymosis Psych- euthymic mood, full affect Neuro- strength and sensation are intact    Labs:   Lab Results  Component Value Date   WBC 4.0 04/21/2015   HGB 8.3* 04/21/2015   HCT 25.9* 04/21/2015   MCV 81.4 04/21/2015   PLT 130* 04/21/2015     Recent Labs Lab 04/22/15 0532  NA 141  K 4.2  CL 110  CO2 18*  BUN 23*  CREATININE 2.32*  CALCIUM 8.8*  GLUCOSE 65     Discharge Medications:    Medication  List    TAKE these medications        acetaminophen 325 MG tablet  Commonly known as:  TYLENOL  Take 650 mg by mouth 2 (two) times daily.     allopurinol 100 MG tablet  Commonly known as:  ZYLOPRIM  Take 100 mg by mouth daily.     carvedilol 6.25 MG tablet  Commonly known as:  COREG  Take 1 tablet (6.25 mg total) by mouth 2 (two) times daily with a meal.     cephALEXin 500 MG capsule  Commonly known as:  KEFLEX  Take 1 capsule (500 mg total) by mouth 2  (two) times daily.     colesevelam 625 MG tablet  Commonly known as:  WELCHOL  Take 1,875 mg by mouth See admin instructions. Take 3 tablets (1875 mg) with breakfast and with lunch     furosemide 40 MG tablet  Commonly known as:  LASIX  Take 40 mg by mouth daily.     levothyroxine 100 MCG tablet  Commonly known as:  SYNTHROID, LEVOTHROID  Take 100 mcg by mouth daily.     LORazepam 0.5 MG tablet  Commonly known as:  ATIVAN  Take 0.5 mg by mouth 3 (three) times daily.     OVER THE COUNTER MEDICATION  Place 1 drop into both eyes 3 (three) times daily as needed (dry eyes). Over the counter eye drops for dry eyes     pantoprazole 40 MG tablet  Commonly known as:  PROTONIX  Take 40 mg by mouth daily.     sodium bicarbonate 650 MG tablet  Take 650 mg by mouth daily.     sulfamethoxazole-trimethoprim 800-160 MG tablet  Commonly known as:  BACTRIM DS,SEPTRA DS  Take 1 tablet by mouth 2 (two) times daily.     ticagrelor 90 MG Tabs tablet  Commonly known as:  BRILINTA  TAKE 1 TABLET (90 MG TOTAL) BY MOUTH 2 (TWO) TIMES DAILY. Resume on 03/02/15        Disposition:  Discharge Instructions    Diet - low sodium heart healthy    Complete by:  As directed      Increase activity slowly    Complete by:  As directed           Follow-up Information    Follow up with [facility] On 04/29/2015.   Why:  at [facility] for wound check   Contact information:   [address] [place] [address] [phone]      Duration of Discharge Encounter: Greater than 30 minutes including physician time.  Signed, Gypsy Balsam, NP 04/22/2015 7:50 AM   EP Attending  Patient seen and examined. Agree with findings above. She is stable this morning after PPM insertion. Will dc to snf for rehab. Will dc home on Keflex for 7 days. She will need to followup for staple removal in a week.  Leonia Reeves.D.

## 2015-04-21 NOTE — Clinical Social Work Note (Signed)
Clinical Social Work Assessment  Patient Details  Name: [name] MRN: [id number] Date of Birth: 11/12/1920  Date of referral:  04/21/15               Reason for consult:  Facility Placement, Discharge Planning                Permission sought to share information with:  Family Supports, Chartered certified accountant granted to share information::  Yes, Verbal Permission Granted  Name::     Nature conservation officer::  SNFs  Relationship::     Contact Information:     Housing/Transportation Living arrangements for the past 2 months:  Single Family Home Source of Information:  Adult Children Patient Interpreter Needed:  None Criminal Activity/Legal Involvement Pertinent to Current Situation/Hospitalization:  No - Comment as needed Significant Relationships:  Adult Children Lives with:  Spouse Do you feel safe going back to the place where you live?  No Need for family participation in patient care:  Yes (Comment)  Care giving concerns:  Patient's daughter [name] reports concerns about the patient and patient's husband living alone at home at this time. She would like the patient placed in SNF with eventual transition to to ALF. [name] plans to get both the patient and patient's husband into an ALF in the future.   Social Worker assessment / plan:  CSW met with patient and patient's daughter [name] at bedside to complete assessment. [name] states that the patient is the main caregiver for the patient's husband who is blind and has dementia. Per [name], the patient may also have dementia. In [name]'s eyes the patient has gotten worse in the past week and does feel the patient can return home at this time. She requests that the patient be placed in a SNF for rehab with eventual transition to an ALF when able. She requests [facility] at [facility]. CSW explained that the daughter should start working on a long term plan financially for the patient and patient's  husband. She states the patient would qualify for Medicaid and will start this process as soon as she can, but she states she does not believe the husband will qualify due to his check. In addition to SNF list, CSW provided daughter with list of ALFs so that the family could start the process of looking for a facility for the patient's husband and for the patient post SNF. CSW explained SNF search/placement process and answered the patient/daughter's questions. CSW will follow up with bed offers.  Employment status:  Retired Nurse, adult PT Recommendations:  [place] / Referral to community resources:  [place]  Patient/Family's Response to care:  Patient's daughter appears to be happy with the care the patient has received.  Patient/Family's Understanding of and Emotional Response to Diagnosis, Current Treatment, and Prognosis:  Patient's daughter appears to have good understanding of reason for patient's admission and the patient's post DC needs.   Emotional Assessment Appearance:  Appears stated age Attitude/Demeanor/Rapport:  Unable to Assess (Patient is asleep and "out of it" according to daughter) Affect (typically observed):  Unable to Assess (Patient is asleep) Orientation:  Oriented to Self, Oriented to Place, Oriented to  Time, Oriented to Situation Alcohol / Substance use:  Never Used Psych involvement (Current and /or in the community):  No (Comment)  Discharge Needs  Concerns to be addressed:  Discharge Planning Concerns Readmission within the last 30 days:  No Current discharge risk:  Chronically  ill, Cognitively Impaired, Physical Impairment Barriers to Discharge:  Continued Medical Work up   [employer] MSW, [place], [place], [phone]

## 2015-04-21 NOTE — H&P (Signed)
Patient Name: [name] Date of Encounter: 04/21/2015     Active Problems:   S/P CABG x 3, LIMA - LAD, SVG-OM, SVG- PDA 08/05/11   Ischemic cardiomyopathy, EF 45-50% 2D 03/13/12   AV block, 3rd degree (HCC)   Pacemaker infection (HCC)   Hyperkalemia   Acute renal failure superimposed on stage 3 chronic kidney disease (HCC)    SUBJECTIVE  Anxious but no complaints.  CURRENT MEDS . [MAR Hold] acetaminophen  650 mg Oral BID  . [MAR Hold] allopurinol  100 mg Oral Daily  . [MAR Hold] carvedilol  6.25 mg Oral BID WC  . [MAR Hold]  ceFAZolin (ANCEF) IV  1 g Intravenous Q12H  .  ceFAZolin (ANCEF) IV  2 g Intravenous To Cath  . chlorhexidine  60 mL Topical Once  . [MAR Hold] colesevelam  1,875 mg Oral BID WC  . [MAR Hold] furosemide  40 mg Oral Daily  . gentamicin irrigation  80 mg Irrigation To Cath  . [MAR Hold] levothyroxine  100 mcg Oral QAC breakfast  . [MAR Hold] LORazepam  0.5 mg Oral TID  . [MAR Hold] pantoprazole  40 mg Oral Daily  . [MAR Hold] sodium bicarbonate  650 mg Oral Daily  . [MAR Hold] sulfamethoxazole-trimethoprim  1 tablet Oral QHS    OBJECTIVE  Filed Vitals:   04/20/15 0744 04/20/15 1430 04/20/15 2035 04/21/15 0430  BP: 152/82 139/61 135/63 139/74  Pulse:  59 59 61  Temp:   98.3 F (36.8 C) 98 F (36.7 C)  TempSrc:   Oral Oral  Resp:   16 16  Height:      Weight:    144 lb 1.6 oz (65.363 kg)  SpO2:  93% 99% 96%    Intake/Output Summary (Last 24 hours) at 04/21/15 0903 Last data filed at 04/21/15 0800  Gross per 24 hour  Intake 473.33 ml  Output      0 ml  Net 473.33 ml   Filed Weights   04/19/15 0506 04/20/15 0559 04/21/15 0430  Weight: 149 lb 3.2 oz (67.677 kg) 144 lb 14.4 oz (65.726 kg) 144 lb 1.6 oz (65.363 kg)    PHYSICAL EXAM  General: Pleasant, NAD. Neuro: Alert and oriented X 3. Moves all extremities spontaneously. Psych: Normal affect. HEENT:  Normal  Neck: Supple without bruits or JVD. Lungs:  Resp regular and  unlabored, CTA. Temp-perm PM on left chest. Dressing with serosang drainage. Heart: RRR no s3, s4, or murmurs. Abdomen: Soft, non-tender, non-distended, BS + x 4.  Extremities: No clubbing, cyanosis or edema. DP/PT/Radials 2+ and equal bilaterally.  Accessory Clinical Findings  CBC No results for input(s): WBC, NEUTROABS, HGB, HCT, MCV, PLT in the last 72 hours. Basic Metabolic Panel  Recent Labs  04/20/15 0331 04/21/15 0548  NA 142 143  K 4.8 4.4  CL 113* 114*  CO2 18* 22  GLUCOSE 92 79  BUN 26* 25*  CREATININE 2.72* 2.60*  CALCIUM 9.4 9.2   Liver Function Tests No results for input(s): AST, ALT, ALKPHOS, BILITOT, PROT, ALBUMIN in the last 72 hours. No results for input(s): LIPASE, AMYLASE in the last 72 hours. Cardiac Enzymes No results for input(s): CKTOTAL, CKMB, CKMBINDEX, TROPONINI in the last 72 hours. BNP Invalid input(s): POCBNP D-Dimer No results for input(s): DDIMER in the last 72 hours. Hemoglobin A1C No results for input(s): HGBA1C in the last 72 hours. Fasting Lipid Panel No results for input(s): CHOL, HDL, LDLCALC, TRIG, CHOLHDL, LDLDIRECT in the  last 72 hours. Thyroid Function Tests No results for input(s): TSH, T4TOTAL, T3FREE, THYROIDAB in the last 72 hours.  Invalid input(s): FREET3  TELE  Ventricular pacing  Radiology/Studies  Dg Chest 2 View  04/18/2015   CLINICAL DATA:  Pacemaker placement  EXAM: CHEST  2 VIEW  COMPARISON:  03/01/2015 chest radiograph  FINDINGS: Single lead left internal jugular pacemaker is noted with lead tip overlying the right ventricular apex. Median sternotomy wires are aligned and intact. Stable cardiomediastinal silhouette with mild cardiomegaly. No pneumothorax. No pleural effusion. No overt pulmonary edema. Mild curvilinear opacities in the peripheral right mid lung and at the lung bases.  IMPRESSION: 1. No pneumothorax. Well-positioned single lead left internal jugular pacemaker. 2. Stable mild cardiomegaly without  overt pulmonary edema. 3. Mild curvilinear opacities in the peripheral right mid lung and at both lung bases, favor mild scarring and/or atelectasis.   Electronically Signed   By: Delbert Phenix M.D.   On: 04/18/2015 08:31    ASSESSMENT AND PLAN  1. PM pocket infection 2. S/p extraction and insertion of a temp-perm PM 3. Nocturnal confusion Rec: The patient is stable for insertion of a new PPM. She has no evidence of systemic infection. We will plan to proceed with PPM re-insertion.    Gregg Taylor,M.D.  04/21/2015 9:03 AM

## 2015-04-21 NOTE — Progress Notes (Signed)
Telephone voicemail left with pts daughter [name] (pts POA) [phone] , pt going down for permanent pacemaker. VSS.

## 2015-04-22 ENCOUNTER — Inpatient Hospital Stay (HOSPITAL_COMMUNITY): Payer: Medicare Other

## 2015-04-22 DIAGNOSIS — Z950 Presence of cardiac pacemaker: Secondary | ICD-10-CM | POA: Diagnosis not present

## 2015-04-22 DIAGNOSIS — T827XXA Infection and inflammatory reaction due to other cardiac and vascular devices, implants and grafts, initial encounter: Secondary | ICD-10-CM | POA: Diagnosis not present

## 2015-04-22 DIAGNOSIS — I110 Hypertensive heart disease with heart failure: Secondary | ICD-10-CM | POA: Diagnosis not present

## 2015-04-22 DIAGNOSIS — T827XXD Infection and inflammatory reaction due to other cardiac and vascular devices, implants and grafts, subsequent encounter: Secondary | ICD-10-CM | POA: Diagnosis not present

## 2015-04-22 DIAGNOSIS — I25810 Atherosclerosis of coronary artery bypass graft(s) without angina pectoris: Secondary | ICD-10-CM | POA: Diagnosis not present

## 2015-04-22 DIAGNOSIS — M6281 Muscle weakness (generalized): Secondary | ICD-10-CM | POA: Diagnosis not present

## 2015-04-22 DIAGNOSIS — K219 Gastro-esophageal reflux disease without esophagitis: Secondary | ICD-10-CM | POA: Diagnosis not present

## 2015-04-22 DIAGNOSIS — Z48812 Encounter for surgical aftercare following surgery on the circulatory system: Secondary | ICD-10-CM | POA: Diagnosis not present

## 2015-04-22 DIAGNOSIS — L02213 Cutaneous abscess of chest wall: Secondary | ICD-10-CM | POA: Diagnosis not present

## 2015-04-22 DIAGNOSIS — I442 Atrioventricular block, complete: Secondary | ICD-10-CM | POA: Diagnosis not present

## 2015-04-22 DIAGNOSIS — E038 Other specified hypothyroidism: Secondary | ICD-10-CM | POA: Diagnosis not present

## 2015-04-22 DIAGNOSIS — E039 Hypothyroidism, unspecified: Secondary | ICD-10-CM | POA: Diagnosis not present

## 2015-04-22 DIAGNOSIS — I255 Ischemic cardiomyopathy: Secondary | ICD-10-CM | POA: Diagnosis not present

## 2015-04-22 DIAGNOSIS — E1122 Type 2 diabetes mellitus with diabetic chronic kidney disease: Secondary | ICD-10-CM | POA: Diagnosis not present

## 2015-04-22 DIAGNOSIS — N179 Acute kidney failure, unspecified: Secondary | ICD-10-CM | POA: Diagnosis not present

## 2015-04-22 DIAGNOSIS — N183 Chronic kidney disease, stage 3 (moderate): Secondary | ICD-10-CM | POA: Diagnosis not present

## 2015-04-22 DIAGNOSIS — I2510 Atherosclerotic heart disease of native coronary artery without angina pectoris: Secondary | ICD-10-CM | POA: Diagnosis not present

## 2015-04-22 DIAGNOSIS — R001 Bradycardia, unspecified: Secondary | ICD-10-CM | POA: Diagnosis not present

## 2015-04-22 DIAGNOSIS — E785 Hyperlipidemia, unspecified: Secondary | ICD-10-CM | POA: Diagnosis not present

## 2015-04-22 LAB — BASIC METABOLIC PANEL
ANION GAP: 13 (ref 5–15)
BUN: 23 mg/dL — ABNORMAL HIGH (ref 6–20)
CO2: 18 mmol/L — AB (ref 22–32)
Calcium: 8.8 mg/dL — ABNORMAL LOW (ref 8.9–10.3)
Chloride: 110 mmol/L (ref 101–111)
Creatinine, Ser: 2.32 mg/dL — ABNORMAL HIGH (ref 0.44–1.00)
GFR calc non Af Amer: 17 mL/min — ABNORMAL LOW (ref >60)
GFR, EST AFRICAN AMERICAN: 20 mL/min — AB (ref >60)
GLUCOSE: 65 mg/dL (ref 65–99)
Potassium: 4.2 mmol/L (ref 3.5–5.1)
Sodium: 141 mmol/L (ref 135–145)

## 2015-04-22 MED ORDER — CEPHALEXIN 500 MG PO CAPS: 500.0000 mg | ORAL_CAPSULE | Freq: Two times a day (BID) | ORAL | Status: DC

## 2015-04-22 NOTE — Clinical Social Work Placement (Signed)
   CLINICAL SOCIAL WORK PLACEMENT  NOTE  Date:  04/22/2015  Patient Details  Name: [name] MRN: [id number] Date of Birth: 03-25-1921  Clinical Social Work is seeking post-discharge placement for this patient at the Skilled  Nursing Facility level of care (*CSW will initial, date and re-position this form in  chart as items are completed):  Yes   Patient/family provided with [facility] Clinical Social Work Department's list of facilities offering this level of care within the geographic area requested by the patient (or if unable, by the patient's family).  Yes   Patient/family informed of their freedom to choose among providers that offer the needed level of care, that participate in Medicare, Medicaid or managed care program needed by the patient, have an available bed and are willing to accept the patient.  Yes   Patient/family informed of [facility]'s ownership interest in [facility] and [facility], as well as of the fact that they are under no obligation to receive care at these facilities.  PASRR submitted to EDS on       PASRR number received on       Existing PASRR number confirmed on       FL2 transmitted to all facilities in geographic area requested by pt/family on 04/22/15     FL2 transmitted to all facilities within larger geographic area on       Patient informed that his/her managed care company has contracts with or will negotiate with certain facilities, including the following:        Yes   Patient/family informed of bed offers received.  Patient chooses bed at [facility]     Physician recommends and patient chooses bed at      Patient to be transferred to [facility] on 04/22/15.  Patient to be transferred to facility by Ambulance     Patient family notified on 04/22/15 of transfer.  Name of family member notified:  [name]     PHYSICIAN Please prepare priority discharge summary, including  medications, Please prepare prescriptions, Please sign FL2     Additional Comment:   Per MD patient ready for DC to [facility]. RN, patient, patient's family, and facility notified of DC. RN given number for report. DC packet on chart. Ambulance transport requested for patient by RN once signed Ativan prescription is received. CSW signing off.  _______________________________________________ Roddie Mc MSW, LCSW, [place], [phone]

## 2015-04-22 NOTE — Evaluation (Signed)
Physical Therapy Evaluation Patient Details Name: [name] MRN: [id number] DOB: July 15, 1920 Today's Date: 04/22/2015   History of Present Illness  pt presents with Pacemaker infection now s/p removal and re-implantation of new Pacemaker.  pt with hx of CABG.  Clinical Impression  Pt generally weak and unsteady with all mobility.  Pt with hx of multiple fall and is fearful of falling when up.  Feel pt would benefit from SNF therapies at D/C to give pt time to improve overall mobility and safety.  Will continue to follow while on acute.      Follow Up Recommendations SNF    Equipment Recommendations  None recommended by PT    Recommendations for Other Services       Precautions / Restrictions Precautions Precautions: Fall Precaution Comments: pt with multiple falls at home. Restrictions Weight Bearing Restrictions: No      Mobility  Bed Mobility Overal bed mobility: Needs Assistance Bed Mobility: Supine to Sit     Supine to sit: Mod assist;HOB elevated     General bed mobility comments: pt needs A to bring hips to EOB and bring trunk up to sitting.    Transfers Overall transfer level: Needs assistance Equipment used: Rolling walker (2 wheeled) Transfers: Sit to/from [employer] Sit to Stand: Mod assist Stand pivot transfers: Mod assist       General transfer comment: cues for UE use and A for power up to standing and for balance.  pt tends to lean posteriorly and indicates fear of falling.  pt with uncontrolled descent to sitting, which she states is baseline for her.    Ambulation/Gait Ambulation/Gait assistance: Mod assist Ambulation Distance (Feet): 3 Feet (forward and back) Assistive device: Rolling walker (2 wheeled) Gait Pattern/deviations: Step-through pattern;Decreased stride length;Shuffle;Leaning posteriorly;Trunk flexed     General Gait Details: pt unsteady and leans to L and posteriorly.  pt fearful of falling and will let go  of RW at times.  A for balance and RW management.    Stairs            Wheelchair Mobility    Modified Rankin (Stroke Patients Only)       Balance Overall balance assessment: History of Falls;Needs assistance Sitting-balance support: No upper extremity supported;Feet supported Sitting balance-Leahy Scale: Good     Standing balance support: Bilateral upper extremity supported;During functional activity Standing balance-Leahy Scale: Poor                               Pertinent Vitals/Pain Pain Assessment: 0-10 Pain Score:  (Did not rate when asked, but states "It's sore.") Pain Location: R shoulder worse than L shoulder. 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      Prior Function Level of Independence: Needs assistance   Gait / Transfers Assistance Needed: Uses RW around the house and W/C for distances.  ADL's / Homemaking Assistance Needed: A with homemaking tasks and A with bathing.  Hand Dominance        Extremity/Trunk Assessment   Upper Extremity Assessment: Generalized weakness (Limited by shoulder pain and pacer surgeries.)           Lower Extremity Assessment: Generalized weakness;LLE deficits/detail   LLE Deficits / Details: pt indicates L knee will buckle without warning.  Bil LEs overall weak.     Cervical / Trunk Assessment: Kyphotic   Communication   Communication: No difficulties  Cognition Arousal/Alertness: Awake/alert Behavior During Therapy: WFL for tasks assessed/performed Overall Cognitive Status: History of cognitive impairments - at baseline (pt forgetful at baseline.)                      General Comments      Exercises        Assessment/Plan    PT Assessment Patient needs continued PT services  PT Diagnosis Difficulty walking;Generalized weakness   PT Problem List Decreased strength;Decreased activity tolerance;Decreased balance;Decreased mobility;Decreased knowledge of use of DME;Pain  PT Treatment Interventions DME instruction;Gait training;Stair training;Therapeutic activities;Functional mobility training;Therapeutic exercise;Balance training;Patient/family education   PT Goals (Current goals can be found in the Care Plan section) Acute Rehab PT Goals Patient Stated Goal: Home with husband PT Goal Formulation: With patient/family Time For Goal Achievement: 05/06/15 Potential to Achieve Goals: Good    Frequency Min 2X/week   Barriers to discharge        Co-evaluation               End of Session Equipment Utilized During Treatment: Gait belt Activity Tolerance: Patient limited by fatigue;Patient limited by pain Patient left: in chair;with call bell/phone within reach;with family/visitor present Nurse Communication: Mobility status         Time: 3790-2409 PT Time Calculation (min) (ACUTE ONLY): 33 min   Charges:   PT Evaluation $Initial PT Evaluation Tier I: 1 Procedure PT Treatments $Therapeutic Activity: 8-22 mins   PT G CodesSunny Schlein, Hannah [phone] 04/22/2015, 12:03 PM

## 2015-04-22 NOTE — Care Management Note (Addendum)
Case Management Note  Patient Details  Name: TAKESHA VANDERBUSH MRN: [id number] Date of Birth: April 13, 1921  Subjective/Objective:   Pt admitted for Pacemaker pocket infection s/p extraction and insertion of new pacemaker system this admission.                 Action/Plan: Plan for d/c to [facility] 04-22-15. CSW assisting with disposition needs. No needs from CM at this time.   Expected Discharge Date:                  Expected Discharge Plan:  Skilled Nursing Facility  In-House Referral:  Clinical Social Work  Discharge planning Services  CM Consult  Post Acute Care Choice:  NA Choice offered to:  NA  DME Arranged:  N/A DME Agency:  NA  HH Arranged:  NA HH Agency:  NA  Status of Service:  Completed, signed off  Medicare Important Message Given:  Yes-second notification given Date Medicare IM Given:    Medicare IM give by:    Date Additional Medicare IM Given:    Additional Medicare Important Message give by:     If discussed at Long Length of Stay Meetings, dates discussed:    Additional Comments:  Gala Lewandowsky, RN 04/22/2015, 10:40 AM

## 2015-04-23 ENCOUNTER — Non-Acute Institutional Stay (SKILLED_NURSING_FACILITY): Payer: Medicare Other | Admitting: Internal Medicine

## 2015-04-23 ENCOUNTER — Encounter: Payer: Self-pay | Admitting: Internal Medicine

## 2015-04-23 DIAGNOSIS — L02213 Cutaneous abscess of chest wall: Secondary | ICD-10-CM | POA: Diagnosis not present

## 2015-04-23 DIAGNOSIS — I110 Hypertensive heart disease with heart failure: Secondary | ICD-10-CM

## 2015-04-23 DIAGNOSIS — N183 Chronic kidney disease, stage 3 unspecified: Secondary | ICD-10-CM

## 2015-04-23 DIAGNOSIS — K219 Gastro-esophageal reflux disease without esophagitis: Secondary | ICD-10-CM | POA: Diagnosis not present

## 2015-04-23 DIAGNOSIS — I25810 Atherosclerosis of coronary artery bypass graft(s) without angina pectoris: Secondary | ICD-10-CM | POA: Diagnosis not present

## 2015-04-23 DIAGNOSIS — E785 Hyperlipidemia, unspecified: Secondary | ICD-10-CM | POA: Diagnosis not present

## 2015-04-23 DIAGNOSIS — E034 Atrophy of thyroid (acquired): Secondary | ICD-10-CM | POA: Diagnosis not present

## 2015-04-23 DIAGNOSIS — E038 Other specified hypothyroidism: Secondary | ICD-10-CM | POA: Diagnosis not present

## 2015-04-23 DIAGNOSIS — N179 Acute kidney failure, unspecified: Secondary | ICD-10-CM

## 2015-04-23 NOTE — Progress Notes (Signed)
MRN: [id number] Name: [name]  Sex: female Age: 81 y.o. DOB: 1921-04-03  PSC #: Sonny Dandy Facility/Room:119 Level Of Care: SNF Provider: Merrilee Seashore D Emergency Contacts: Extended Emergency Contact Information Primary Emergency Contact: Jones,[name]  [place] Home Phone: [phone] Relation: Daughter Secondary Emergency Contact: Nyoka Cowden Address:              [place] Home Phone: [phone] Relation: Son  Code Status:   Allergies: Statins and Tape  Chief Complaint  Patient presents with  . New Admit To SNF    HPI: Patient is 81 y.o. female with extensive cardiac hx with CABG, HTN,CHF, HLD. She underwent PPM implantation several weeks ago and did well initially but developed purulent drainage from her pacemaker pocket. She was admitted to hospital from 10/7-12 where pacemaker was replaced and abscess drained and p was tx with IV abx. Pt is admitted to SNF for generalized weakness and for post-op care.While at SNF pt will be follwed for HTN, tx with metoprolol, GERD, tx with protonix and, HLD, tx with welchol.  Past Medical History  Diagnosis Date  . ST elevation myocardial infarction (STEMI) of inferior wall (HCC) 05/2011; 03/10/2012    a) 11/'12: 100% RCA, dLM 80% -- POBA of RCA (for planned CABG, not done until re-admission with NSTEMI 1/'13);; b) 8/'13: 100% SVG-RCA (PCI & re-PCI), native RCA 100%; Patent LIMA-p-mLAD, SVG-OM  . CAD S/P percutaneous coronary angioplasty 11/'12; 8/31 & 9/1/'13    a) MV: 100% RCA-POBA, dLM left main 80%; b) 1/'13: NSTEMI --> CABG; c) 2013: 8/31 - 100% RCA & acute SVG-RCA, 100% SVG-OM, 90% LM, 80% p&mLAD, ~70% RI, 50% Cx --> PCI-SVG-RCA: Promus Element DES x 3 (prox 2.5 mm x 38 mm & 2.5 mm x 16 mm, distal 2.5 mm x 16 mm); on 9/1 - accute in-stent Thrombosis - Aspiration thrombectomy & PTCA  . S/P CABG x 4 08/05/11    Dr. Tyrone Sage: LIMA-p-mLAD, SVG-RPDA, SVG-OM; known  100% SVG-OM, Extensive  PTCA of SVG-RCA;;  Hospital course complicated by Afib & PNA; after d/c cellulitis of SVG harvest site due to edema  . Episodic atrial fibrillation (HCC) 07/2011    Post-Op CABG  . Ischemic cardiomyopathy 03/2012    Echo: EF 45-50% - Mild basal- mid inferolateral Hypokinesis: Gr 1 DD, mildly increased PAP.  Marland Kitchen Dyslipidemia, goal LDL below 70     statin intolerance (lipitor, crestor drug reaction); Welchol & Zetia  . Hypertension, essential   . DM (diabetes mellitus), type 2 with renal complications (HCC)   . Chronic kidney disease (CKD) stage G3b/A1, moderately decreased glomerular filtration rate (GFR) between 30-44 mL/min/1.73 square meter and albuminuria creatinine ratio less than 30 mg/g   . History of pneumonia     post-op from CABG  . Hypothyroidism     on synthroid  . Headache(784.0)   . Osteoarthritis   . Anxiety     PRN Xanax   . Dermatophytosis of nail   . Gout     on Allopurinol  . Lower extremity edema     chronic  . GERD (gastroesophageal reflux disease) 04/26/2015    Past Surgical History  Procedure Laterality Date  . Cardiac catheterization  November 2012; August and September 2013    Patent LIMA-LAD, patent she had an OM, patent stented SVG-RCA; occluded native RCA, 90% ostial left main, tandem 80% proximal and mid LAD, 70% mid Circumflex  . Coronary angioplasty  November 2012    PTCA only of RCA  and setting of inferior STEMI  . Coronary artery bypass graft  08/05/2011    Procedure: CORONARY ARTERY BYPASS GRAFTING (CABG);  Surgeon: [name], MD;  Location: [facility] OR;  Service: Open Heart Surgery;  Laterality: N/A;  coronary artery bypass graft times 4 using left internal mammary artery and right leg saphenous vein harvested endoscopically  . Joint replacement      both hips replaced  . I&d extremity  09/21/2011    Procedure: IRRIGATION AND DEBRIDEMENT EXTREMITY;  Surgeon: [name], MD;  Location: [facility] OR;  Service: Vascular;  Laterality: Right;  with wound  vac placement  . Coronary angioplasty with stent placement  03/10/2012     infferior STEMI: Occluded native RCA and SVG-RCA --> thrombectomy and 3 stent placement to the SVG-RCA (2.5 mm right 38 mm and 2 proximal distal overlapping 2.5 mm x 16 mm Promus DES stents:  . Coronary angioplasty  03/11/2012    Inferior STEMI #3: Reoccluded SVG-RCA; extensive thrombectomy and post dilation PTCA  . Transthoracic echocardiogram  September 2013    EF 45-50%, mild hypokinesis of the basal and mid inferolateral wall, grade 1 diastolic dysfunction, mildly increased artery pressures.  . Left heart catheterization with coronary angiogram N/A 05/26/2011    Procedure: LEFT HEART CATHETERIZATION WITH CORONARY ANGIOGRAM;  Surgeon: Marykay Lex, MD;  Location: [facility] CATH LAB;  Service: Cardiovascular;  Laterality: N/A;  . Percutaneous coronary intervention-balloon only N/A 05/26/2011    Procedure: PERCUTANEOUS CORONARY INTERVENTION-BALLOON ONLY;  Surgeon: Marykay Lex, MD;  Location: [facility] CATH LAB;  Service: Cardiovascular;  Laterality: N/A;  . Left heart cath N/A 03/10/2012    Procedure: LEFT HEART CATH;  Surgeon: Herby Abraham, MD;  Location: [facility] CATH LAB;  Service: Cardiovascular;  Laterality: N/A;  . Percutaneous coronary stent intervention (pci-s)  03/10/2012    Procedure: PERCUTANEOUS CORONARY STENT INTERVENTION (PCI-S);  Surgeon: Herby Abraham, MD;  Location: [facility] CATH LAB;  Service: Cardiovascular;;  . Left heart catheterization with coronary angiogram Bilateral 03/11/2012    Procedure: LEFT HEART CATHETERIZATION WITH CORONARY ANGIOGRAM;  Surgeon: Thurmon Fair, MD;  Location: [facility] CATH LAB;  Service: Cardiovascular;  Laterality: Bilateral;  . Ep implantable device N/A 02/24/2015    Procedure: Pacemaker Implant;  Surgeon: Thurmon Fair, MD;  Location: [facility] INVASIVE CV LAB;  Service: Cardiovascular;  Laterality: N/A;  . Ep implantable device N/A 04/17/2015    Procedure: Lead Extraction, Can Extraction;  Surgeon:  Marinus Maw, MD;  Location: [facility] INVASIVE CV LAB;  Service: Cardiovascular;  Laterality: N/A;  . Cardiac catheterization N/A 04/17/2015    Procedure: Temporary Pacemaker;  Surgeon: Marinus Maw, MD;  Location: [facility] INVASIVE CV LAB;  Service: Cardiovascular;  Laterality: N/A;  . Ep implantable device N/A 04/21/2015    Procedure: Pacemaker Implant;  Surgeon: Marinus Maw, MD;  Location: [facility] INVASIVE CV LAB;  Service: Cardiovascular;  Laterality: N/A;  . Pacemaker lead removal  04/21/2015    Procedure: Pacemaker Lead Removal;  Surgeon: Marinus Maw, MD;  Location: [facility] INVASIVE CV LAB;  Service: Cardiovascular;;  LV       Medication List       This list is accurate as of: 04/23/15 11:59 PM.  Always use your most recent med list.               acetaminophen 325 MG tablet  Commonly known as:  TYLENOL  Take 650 mg by mouth 2 (two) times daily.     allopurinol 100 MG  tablet  Commonly known as:  ZYLOPRIM  Take 100 mg by mouth daily.     carvedilol 6.25 MG tablet  Commonly known as:  COREG  Take 1 tablet (6.25 mg total) by mouth 2 (two) times daily with a meal.     cephALEXin 500 MG capsule  Commonly known as:  KEFLEX  Take 1 capsule (500 mg total) by mouth 2 (two) times daily.     colesevelam 625 MG tablet  Commonly known as:  WELCHOL  Take 1,875 mg by mouth See admin instructions. Take 3 tablets (1875 mg) with breakfast and with lunch     furosemide 40 MG tablet  Commonly known as:  LASIX  Take 40 mg by mouth daily.     levothyroxine 100 MCG tablet  Commonly known as:  SYNTHROID, LEVOTHROID  Take 100 mcg by mouth daily.     LORazepam 0.5 MG tablet  Commonly known as:  ATIVAN  Take 0.5 mg by mouth 3 (three) times daily.     OVER THE COUNTER MEDICATION  Place 1 drop into both eyes 3 (three) times daily as needed (dry eyes). Over the counter eye drops for dry eyes     pantoprazole 40 MG tablet  Commonly known as:  PROTONIX  Take 40 mg by mouth daily.     sodium  bicarbonate 650 MG tablet  Take 650 mg by mouth daily.     sulfamethoxazole-trimethoprim 800-160 MG tablet  Commonly known as:  BACTRIM DS,SEPTRA DS  Take 1 tablet by mouth 2 (two) times daily.     ticagrelor 90 MG Tabs tablet  Commonly known as:  BRILINTA  TAKE 1 TABLET (90 MG TOTAL) BY MOUTH 2 (TWO) TIMES DAILY. Resume on 03/02/15        No orders of the defined types were placed in this encounter.    Immunization History  Administered Date(s) Administered  . Pneumococcal Polysaccharide-23 05/28/2011  . Td 11/08/2011    Social History  Substance Use Topics  . Smoking status: Never Smoker   . Smokeless tobacco: Never Used  . Alcohol Use: 0.6 oz/week    1 Glasses of wine per week    Family history is +breast CA, mom, HD, dad   Review of Systems  DATA OBTAINED: from patient, nurse GENERAL:  no fevers, fatigue, appetite changes SKIN: No itching, rash or wounds EYES: No eye pain, redness, discharge EARS: No earache, tinnitus, change in hearing NOSE: No congestion, drainage or bleeding  MOUTH/THROAT: No mouth or tooth pain, No sore throat RESPIRATORY: No cough, wheezing, SOB CARDIAC: No chest pain, palpitations, lower extremity edema  GI: No abdominal pain, No N/V/D or constipation, No heartburn or reflux  GU: No dysuria, frequency or urgency, or incontinence  MUSCULOSKELETAL: No unrelieved bone/joint pain NEUROLOGIC: No headache, dizziness or focal weakness PSYCHIATRIC: No c/o anxiety or sadness   Filed Vitals:   04/23/15 2105  BP: 130/60  Pulse: 72  Temp: 98 F (36.7 C)  Resp: 20    SpO2 Readings from Last 1 Encounters:  04/23/15 100%        Physical Exam  GENERAL APPEARANCE: Alert, conversant,  Very pleasant BF, No acute distress.  SKIN: No diaphoresis rash, incision dressed, no heat or swelling HEAD: Normocephalic, atraumatic  EYES: Conjunctiva/lids clear. Pupils round, reactive. EOMs intact.  EARS: External exam WNL, canals clear. Hearing  grossly normal.  NOSE: No deformity or discharge.  MOUTH/THROAT: Lips w/o lesions  RESPIRATORY: Breathing is even, unlabored. Lung sounds are clear  CARDIOVASCULAR: Heart RRR no murmurs, rubs or gallops. No peripheral edema.   GASTROINTESTINAL: Abdomen is soft, non-tender, not distended w/ normal bowel sounds. GENITOURINARY: Bladder non tender, not distended  MUSCULOSKELETAL: No abnormal joints or musculature NEUROLOGIC:  Cranial nerves 2-12 grossly intact. Moves all extremities  PSYCHIATRIC: Mood and affect appropriate to situation, no behavioral issues  Patient Active Problem List   Diagnosis Date Noted  . Abscess of chest wall 04/26/2015  . GERD (gastroesophageal reflux disease) 04/26/2015  . Hyperkalemia 04/19/2015  . Acute renal failure superimposed on stage 3 chronic kidney disease (HCC) 04/19/2015  . Pacemaker infection (HCC) 04/14/2015  . AV block, 3rd degree (HCC)   . Bradycardia   . Symptomatic bradycardia 02/23/2015  . CAD (coronary artery disease) of bypass graft, along with subtotal left main occlusion and and RCA occlusion - graft dependent 08/03/2013  . Dermatophytosis of nail   . Obesity (BMI 30-39.9) 02/15/2013  . Ischemic cardiomyopathy, EF 45-50% 2D 03/13/12 03/15/2012  . STEMI, 03/10/12 Rx'd with PCI with recurrance 03/11/12 - redo PCI of SVG-RCA 03/13/2012  . S/P CABG x 3, LIMA - LAD, SVG-OM, SVG- PDA 08/05/11 08/05/2011    Class: Hospitalized for  . DM (diabetes mellitus), type 2 with renal complications (HCC) 05/28/2011  . History of gout 05/28/2011  . Hypothyroidism 05/27/2011  . Inf STEMI #1 -PTCA to RCA (05/2011) + LM 80;NSTEMI (07/2011) - CABG x3;  inf STEMI #2 -- SVG-RCA  PCI with DES X 3 on 03/10/12; with re-occlusion 24hrs later (inferior STEMI #3) - redo thrombectomy/PTCA 05/26/2011  . Hypertensive heart disease with CHF (congestive heart failure) (HCC) 05/26/2011  . Dyslipidemia with statin intolerance 05/26/2011    CBC    Component Value Date/Time    WBC 4.0 04/21/2015 1225   RBC 3.18* 04/21/2015 1225   HGB 8.3* 04/21/2015 1225   HCT 25.9* 04/21/2015 1225   PLT 130* 04/21/2015 1225   MCV 81.4 04/21/2015 1225   LYMPHSABS 1.1 02/23/2015 1718   MONOABS 0.4 02/23/2015 1718   EOSABS 0.0 02/23/2015 1718   BASOSABS 0.0 02/23/2015 1718    CMP     Component Value Date/Time   NA 141 04/22/2015 0532   K 4.2 04/22/2015 0532   CL 110 04/22/2015 0532   CO2 18* 04/22/2015 0532   GLUCOSE 65 04/22/2015 0532   BUN 23* 04/22/2015 0532   CREATININE 2.32* 04/22/2015 0532   CREATININE 1.57* 08/29/2011 1553   CALCIUM 8.8* 04/22/2015 0532   PROT 5.2* 02/23/2015 2213   ALBUMIN 2.8* 02/23/2015 2213   AST 30 02/23/2015 2213   ALT 16 02/23/2015 2213   ALKPHOS 131* 02/23/2015 2213   BILITOT 0.4 02/23/2015 2213   GFRNONAA 17* 04/22/2015 0532   GFRAA 20* 04/22/2015 0532    Lab Results  Component Value Date   HGBA1C 5.9* 02/23/2015     Dg Chest 2 View  04/18/2015  CLINICAL DATA:  Pacemaker placement EXAM: CHEST  2 VIEW COMPARISON:  03/01/2015 chest radiograph FINDINGS: Single lead left internal jugular pacemaker is noted with lead tip overlying the right ventricular apex. Median sternotomy wires are aligned and intact. Stable cardiomediastinal silhouette with mild cardiomegaly. No pneumothorax. No pleural effusion. No overt pulmonary edema. Mild curvilinear opacities in the peripheral right mid lung and at the lung bases. IMPRESSION: 1. No pneumothorax. Well-positioned single lead left internal jugular pacemaker. 2. Stable mild cardiomegaly without overt pulmonary edema. 3. Mild curvilinear opacities in the peripheral right mid lung and at both lung bases, favor mild scarring and/or  atelectasis. Electronically Signed   By: Delbert Phenix M.D.   On: 04/18/2015 08:31    Not all labs, radiology exams or other studies done during hospitalization come through on my EPIC note; however they are reviewed by me.    Assessment and Plan  Abscess of chest  wall underwent pacemaker system extraction with insertion of temp-perm pacemaker. She had several days of IV antibiotics and then underwent reimplantation of a MDT dual chamber pacemaker on 04-21-15 by Dr Ladona Ridgel. There were no early apparent complications. CXR demonstrated no ptx s/p pacemaker implantation. SNF- Pt/PT; wound care  Hypertensive heart disease with CHF (congestive heart failure) (HCC) SNF -stable ;cont coreg 6.25 BID and lasix  Hypothyroidism SNF - TSH 2.2; cont synthroid 100 mcg daily  GERD (gastroesophageal reflux disease) SNF - cont protonix 40 mg daily  Dyslipidemia with statin intolerance SNF - LDL 88, HDL 68 on welchol 1875mg  BID  Acute renal failure superimposed on stage 3 chronic kidney disease (HCC) SNF - BMP to follow resolution and pt on lasix  CAD (coronary artery disease) of bypass graft, along with subtotal left main occlusion and and RCA occlusion - graft dependent SNF - stable;cont coreg and welchel   Time spent 45 min;> 50% of time with patient was spent reviewing records, labs, tests and studies, counseling and developing plan of care  Margit Hanks, MD

## 2015-04-26 ENCOUNTER — Encounter: Payer: Self-pay | Admitting: Internal Medicine

## 2015-04-26 DIAGNOSIS — L02213 Cutaneous abscess of chest wall: Secondary | ICD-10-CM | POA: Insufficient documentation

## 2015-04-26 DIAGNOSIS — K219 Gastro-esophageal reflux disease without esophagitis: Secondary | ICD-10-CM

## 2015-04-26 HISTORY — DX: Gastro-esophageal reflux disease without esophagitis: K21.9

## 2015-04-26 NOTE — Assessment & Plan Note (Signed)
underwent pacemaker system extraction with insertion of temp-perm pacemaker. She had several days of IV antibiotics and then underwent reimplantation of a MDT dual chamber pacemaker on 04-21-15 by Dr Ladona Ridgel. There were no early apparent complications. CXR demonstrated no ptx s/p pacemaker implantation. SNF- Pt/PT; wound care

## 2015-04-26 NOTE — Assessment & Plan Note (Signed)
SNF - LDL 88, HDL 68 on welchol 1875mg  BID

## 2015-04-26 NOTE — Assessment & Plan Note (Signed)
SNF - TSH 2.2; cont synthroid 100 mcg daily

## 2015-04-26 NOTE — Assessment & Plan Note (Signed)
SNF - BMP to follow resolution and pt on lasix

## 2015-04-26 NOTE — Assessment & Plan Note (Signed)
SNF -stable ;cont coreg 6.25 BID and lasix

## 2015-04-26 NOTE — Assessment & Plan Note (Signed)
SNF - stable;cont coreg and welchel

## 2015-04-26 NOTE — Assessment & Plan Note (Signed)
SNF - cont protonix 40 mg daily 

## 2015-04-27 ENCOUNTER — Telehealth: Payer: Self-pay | Admitting: Internal Medicine

## 2015-04-27 NOTE — Telephone Encounter (Signed)
New message      Pt is in rehab.  She has complained about the left side of her neck all weekend.  Could this be related to the device? Also, family did not get any discharge papers before being transferred to [facility]. Is there anything they should know?  Also, pacemaker box is in her room but it is not hooked up.  Should it be hooked up?

## 2015-04-27 NOTE — Telephone Encounter (Signed)
Returned call to caregiver. She did not notice any redness or swelling near neck/device site yesterday, she has not seen her mother today. She will check tomorrow. Advised neck pain could be related to her position during the procedure. Advised caregiver about wound care and activity limitations. Instructed her to plug in monitor tomorrow when she visits.  Confirmed wound check appt on Wednesday 04/29/15.

## 2015-04-28 ENCOUNTER — Telehealth: Payer: Self-pay | Admitting: Internal Medicine

## 2015-04-28 NOTE — Telephone Encounter (Signed)
New Message  Pt dtr calling to speak w/ RN- Pt dtr wants to remove pt from rehab- she thinks it is making things worse. Pt dtr wanted to see if she need Dr Bruna Potter authorization before removing her. Please call back and discuss.

## 2015-04-28 NOTE — Telephone Encounter (Signed)
Dr Ladona Ridgel only explanted device and reimplanted.   Her doctors are Dr Herbie Baltimore and Dr Royann Shivers  Will forward to them to address

## 2015-04-28 NOTE — Telephone Encounter (Signed)
Spoke to daughter, who informed me she had concerns about patient's care at [facility]. Pt transferred there from hospital on 10/13 following PM implantation by Dr. Ladona Ridgel. She states patient is "hallucinating, confused". She is also concerned because pt's husband is at home without her - he has dementia, this seems to be worse since pt put in SNF.  Daughter wanted to know if pt could be discharged sooner than anticipated date (4-6 weeks) to resume home health nursing and PT care.  Informed her I was not sure who would be most appropriate to address these concerns - -but would route to Dr. Herbie Baltimore for recommendations, & return call with advice. She voiced understanding.

## 2015-04-29 ENCOUNTER — Ambulatory Visit (INDEPENDENT_AMBULATORY_CARE_PROVIDER_SITE_OTHER): Payer: Medicare Other | Admitting: *Deleted

## 2015-04-29 ENCOUNTER — Encounter: Payer: Self-pay | Admitting: Cardiovascular Disease

## 2015-04-29 DIAGNOSIS — I442 Atrioventricular block, complete: Secondary | ICD-10-CM

## 2015-04-29 DIAGNOSIS — R001 Bradycardia, unspecified: Secondary | ICD-10-CM

## 2015-04-29 LAB — CUP PACEART INCLINIC DEVICE CHECK
Battery Voltage: 2.79 V
Brady Statistic AP VS Percent: 0 %
Brady Statistic AS VP Percent: 60 %
Brady Statistic AS VS Percent: 0 %
Implantable Lead Implant Date: 20161011
Implantable Lead Implant Date: 20161011
Implantable Lead Location: 753859
Lead Channel Impedance Value: 540 Ohm
Lead Channel Impedance Value: 572 Ohm
Lead Channel Pacing Threshold Amplitude: 0.5 V
Lead Channel Pacing Threshold Pulse Width: 0.4 ms
Lead Channel Pacing Threshold Pulse Width: 0.4 ms
Lead Channel Setting Pacing Amplitude: 3.5 V
Lead Channel Setting Pacing Pulse Width: 0.4 ms
Lead Channel Setting Sensing Sensitivity: 4 mV
MDC IDC LEAD LOCATION: 753860
MDC IDC MSMT BATTERY IMPEDANCE: 100 Ohm
MDC IDC MSMT BATTERY REMAINING LONGEVITY: 106 mo
MDC IDC MSMT LEADCHNL RA PACING THRESHOLD AMPLITUDE: 0.5 V
MDC IDC MSMT LEADCHNL RA SENSING INTR AMPL: 2.8 mV
MDC IDC MSMT LEADCHNL RV SENSING INTR AMPL: 8 mV
MDC IDC SESS DTM: 20161019160907
MDC IDC SET LEADCHNL RV PACING AMPLITUDE: 3.5 V
MDC IDC STAT BRADY AP VP PERCENT: 39 %

## 2015-04-29 NOTE — Telephone Encounter (Signed)
I will defer this to PCP in the rehabilitation physicians. I did not even know she was in the hospital except for the fact that walked by her room. I have no clue as to how weak she was while she was in the hospital read in general, I would be fine with her going home if it is felt to be safe.  Marykay Lex, MD

## 2015-04-29 NOTE — Progress Notes (Signed)
Wound check appointment s/p extraction and reimplantation on contralateral side. Steri-strips removed from right chest incision. Sutures removed from extraction site. Wound without redness or edema. Incision edges approximated, wound well healed. Normal device function. Thresholds, sensing, and impedances consistent with implant measurements. Device programmed at 3.5V until 3 month visit. Histogram distribution appropriate for patient and level of activity. No mode switches or high ventricular rates noted. Patient educated about wound care, arm mobility, lifting restrictions (written instructions provided to [place]). Daughter concerned about ongoing hallucinations- reports that she is returning home Friday. Advised to inquire about sleeping routines at [facility] and to seek advise from PCP if hallucinations continue once she returns home and settles into routine. ROV with [facility] 06/02/15.

## 2015-05-01 ENCOUNTER — Encounter: Payer: Self-pay | Admitting: Nurse Practitioner

## 2015-05-01 ENCOUNTER — Non-Acute Institutional Stay (SKILLED_NURSING_FACILITY): Payer: Medicare Other | Admitting: Nurse Practitioner

## 2015-05-01 DIAGNOSIS — E034 Atrophy of thyroid (acquired): Secondary | ICD-10-CM | POA: Diagnosis not present

## 2015-05-01 DIAGNOSIS — I110 Hypertensive heart disease with heart failure: Secondary | ICD-10-CM | POA: Diagnosis not present

## 2015-05-01 DIAGNOSIS — K219 Gastro-esophageal reflux disease without esophagitis: Secondary | ICD-10-CM | POA: Diagnosis not present

## 2015-05-01 DIAGNOSIS — E038 Other specified hypothyroidism: Secondary | ICD-10-CM | POA: Diagnosis not present

## 2015-05-01 DIAGNOSIS — R531 Weakness: Secondary | ICD-10-CM

## 2015-05-01 DIAGNOSIS — L02213 Cutaneous abscess of chest wall: Secondary | ICD-10-CM

## 2015-05-01 DIAGNOSIS — E785 Hyperlipidemia, unspecified: Secondary | ICD-10-CM

## 2015-05-01 DIAGNOSIS — I25810 Atherosclerosis of coronary artery bypass graft(s) without angina pectoris: Secondary | ICD-10-CM | POA: Diagnosis not present

## 2015-05-01 NOTE — Telephone Encounter (Signed)
LMTCB

## 2015-05-01 NOTE — Progress Notes (Signed)
Patient ID: [name], female   DOB: [birth year], 81 y.o.   MRN: [id number]    Nursing Home Location:  [facility]   Code Status:  DNR  Place of Service: SNF (31)  PCP: Michiel Sites, MD  Allergies  Allergen Reactions  . Statins Rash  . Tape Rash    Please use paper tape    Chief Complaint  Patient presents with  . Discharge Note    Discharge from SNF     HPI:  Patient is a 81 y.o. female seen today at [facility] for discharge home with family. Pt with extensive cardiac hx with CABG, HTN,CHF, HLD. She underwent PPM implantation several weeks ago and did well initially but developed purulent drainage from her pacemaker pocket. She was admitted to hospital from 10/7-10/12 where pacemaker was replaced and abscess drained and she was treated with IV abx. Pts daughter at bedside and reports she is not getting stronger here as she thought she would and family is ready to take her home with home health and 24 hour care.  No pain from pacemaker sight, incision is well healed.  Review of Systems:  Review of Systems  Constitutional: Negative for activity change, appetite change, fatigue and unexpected weight change.  HENT: Negative for congestion and hearing loss.   Eyes: Negative.   Respiratory: Negative for cough and shortness of breath.   Cardiovascular: Negative for chest pain, palpitations and leg swelling.  Gastrointestinal: Negative for abdominal pain, diarrhea and constipation.  Genitourinary: Negative for dysuria and difficulty urinating.  Musculoskeletal: Negative for myalgias and arthralgias.  Skin: Negative for color change and wound.  Neurological: Positive for weakness (generalized). Negative for dizziness.  Psychiatric/Behavioral: Negative for behavioral problems, confusion and agitation.       Memory loss    Past Medical History  Diagnosis Date  . ST elevation myocardial infarction (STEMI) of inferior wall (HCC) 05/2011;  03/10/2012    a) 11/'12: 100% RCA, dLM 80% -- POBA of RCA (for planned CABG, not done until re-admission with NSTEMI 1/'13);; b) 8/'13: 100% SVG-RCA (PCI & re-PCI), native RCA 100%; Patent LIMA-p-mLAD, SVG-OM  . CAD S/P percutaneous coronary angioplasty 11/'12; 8/31 & 9/1/'13    a) MV: 100% RCA-POBA, dLM left main 80%; b) 1/'13: NSTEMI --> CABG; c) 2013: 8/31 - 100% RCA & acute SVG-RCA, 100% SVG-OM, 90% LM, 80% p&mLAD, ~70% RI, 50% Cx --> PCI-SVG-RCA: Promus Element DES x 3 (prox 2.5 mm x 38 mm & 2.5 mm x 16 mm, distal 2.5 mm x 16 mm); on 9/1 - accute in-stent Thrombosis - Aspiration thrombectomy & PTCA  . S/P CABG x 4 08/05/11    Dr. Tyrone Sage: LIMA-p-mLAD, SVG-RPDA, SVG-OM; known  100% SVG-OM, Extensive PTCA of SVG-RCA;;  Hospital course complicated by Afib & PNA; after d/c cellulitis of SVG harvest site due to edema  . Episodic atrial fibrillation (HCC) 07/2011    Post-Op CABG  . Ischemic cardiomyopathy 03/2012    Echo: EF 45-50% - Mild basal- mid inferolateral Hypokinesis: Gr 1 DD, mildly increased PAP.  Marland Kitchen Dyslipidemia, goal LDL below 70     statin intolerance (lipitor, crestor drug reaction); Welchol & Zetia  . Hypertension, essential   . DM (diabetes mellitus), type 2 with renal complications (HCC)   . Chronic kidney disease (CKD) stage G3b/A1, moderately decreased glomerular filtration rate (GFR) between 30-44 mL/min/1.73 square meter and albuminuria creatinine ratio less than 30 mg/g   . History of pneumonia  post-op from CABG  . Hypothyroidism     on synthroid  . Headache(784.0)   . Osteoarthritis   . Anxiety     PRN Xanax   . Dermatophytosis of nail   . Gout     on Allopurinol  . Lower extremity edema     chronic  . GERD (gastroesophageal reflux disease) 04/26/2015   Past Surgical History  Procedure Laterality Date  . Cardiac catheterization  November 2012; August and September 2013    Patent LIMA-LAD, patent she had an OM, patent stented SVG-RCA; occluded native RCA, 90%  ostial left main, tandem 80% proximal and mid LAD, 70% mid Circumflex  . Coronary angioplasty  November 2012    PTCA only of RCA and setting of inferior STEMI  . Coronary artery bypass graft  08/05/2011    Procedure: CORONARY ARTERY BYPASS GRAFTING (CABG);  Surgeon: [name], MD;  Location: [facility] OR;  Service: Open Heart Surgery;  Laterality: N/A;  coronary artery bypass graft times 4 using left internal mammary artery and right leg saphenous vein harvested endoscopically  . Joint replacement      both hips replaced  . I&d extremity  09/21/2011    Procedure: IRRIGATION AND DEBRIDEMENT EXTREMITY;  Surgeon: [name], MD;  Location: [facility] OR;  Service: Vascular;  Laterality: Right;  with wound vac placement  . Coronary angioplasty with stent placement  03/10/2012     infferior STEMI: Occluded native RCA and SVG-RCA --> thrombectomy and 3 stent placement to the SVG-RCA (2.5 mm right 38 mm and 2 proximal distal overlapping 2.5 mm x 16 mm Promus DES stents:  . Coronary angioplasty  03/11/2012    Inferior STEMI #3: Reoccluded SVG-RCA; extensive thrombectomy and post dilation PTCA  . Transthoracic echocardiogram  September 2013    EF 45-50%, mild hypokinesis of the basal and mid inferolateral wall, grade 1 diastolic dysfunction, mildly increased artery pressures.  . Left heart catheterization with coronary angiogram N/A 05/26/2011    Procedure: LEFT HEART CATHETERIZATION WITH CORONARY ANGIOGRAM;  Surgeon: Marykay Lex, MD;  Location: [facility] CATH LAB;  Service: Cardiovascular;  Laterality: N/A;  . Percutaneous coronary intervention-balloon only N/A 05/26/2011    Procedure: PERCUTANEOUS CORONARY INTERVENTION-BALLOON ONLY;  Surgeon: Marykay Lex, MD;  Location: [facility] CATH LAB;  Service: Cardiovascular;  Laterality: N/A;  . Left heart cath N/A 03/10/2012    Procedure: LEFT HEART CATH;  Surgeon: Herby Abraham, MD;  Location: [facility] CATH LAB;  Service: Cardiovascular;  Laterality: N/A;  . Percutaneous  coronary stent intervention (pci-s)  03/10/2012    Procedure: PERCUTANEOUS CORONARY STENT INTERVENTION (PCI-S);  Surgeon: Herby Abraham, MD;  Location: [facility] CATH LAB;  Service: Cardiovascular;;  . Left heart catheterization with coronary angiogram Bilateral 03/11/2012    Procedure: LEFT HEART CATHETERIZATION WITH CORONARY ANGIOGRAM;  Surgeon: Thurmon Fair, MD;  Location: [facility] CATH LAB;  Service: Cardiovascular;  Laterality: Bilateral;  . Ep implantable device N/A 02/24/2015    Procedure: Pacemaker Implant;  Surgeon: Thurmon Fair, MD;  Location: [facility] INVASIVE CV LAB;  Service: Cardiovascular;  Laterality: N/A;  . Ep implantable device N/A 04/17/2015    Procedure: Lead Extraction, Can Extraction;  Surgeon: Marinus Maw, MD;  Location: [facility] INVASIVE CV LAB;  Service: Cardiovascular;  Laterality: N/A;  . Cardiac catheterization N/A 04/17/2015    Procedure: Temporary Pacemaker;  Surgeon: Marinus Maw, MD;  Location: [facility] INVASIVE CV LAB;  Service: Cardiovascular;  Laterality: N/A;  . Ep implantable device N/A 04/21/2015  Procedure: Pacemaker Implant;  Surgeon: Marinus Maw, MD;  Location: [facility] INVASIVE CV LAB;  Service: Cardiovascular;  Laterality: N/A;  . Pacemaker lead removal  04/21/2015    Procedure: Pacemaker Lead Removal;  Surgeon: Marinus Maw, MD;  Location: [facility] INVASIVE CV LAB;  Service: Cardiovascular;;  LV    Social History:   reports that she has never smoked. She has never used smokeless tobacco. She reports that she drinks about 0.6 oz of alcohol per week. She reports that she does not use illicit drugs.  Family History  Problem Relation Age of Onset  . Breast cancer Mother   . Heart disease Father     Medications: Patient's Medications  New Prescriptions   No medications on file  Previous Medications   ACETAMINOPHEN (TYLENOL) 325 MG TABLET    Take 650 mg by mouth 2 (two) times daily.   ALLOPURINOL (ZYLOPRIM) 100 MG TABLET    Take 100 mg by mouth daily.    CARVEDILOL (COREG)  6.25 MG TABLET    Take 1 tablet (6.25 mg total) by mouth 2 (two) times daily with a meal.   COLESEVELAM (WELCHOL) 625 MG TABLET    Take 1,875 mg by mouth See admin instructions. Take 3 tablets (1875 mg) with breakfast and with lunch   FUROSEMIDE (LASIX) 40 MG TABLET    Take 40 mg by mouth daily.    LEVOTHYROXINE (SYNTHROID, LEVOTHROID) 100 MCG TABLET    Take 100 mcg by mouth daily.    LORAZEPAM (ATIVAN) 0.5 MG TABLET    Take 0.5 mg by mouth 3 (three) times daily.    PANTOPRAZOLE (PROTONIX) 40 MG TABLET    Take 40 mg by mouth daily.   POLYVINYL ALCOHOL (LIQUIFILM TEARS) 1.4 % OPHTHALMIC SOLUTION    Place 1 drop into both eyes 3 (three) times daily.   SODIUM BICARBONATE 650 MG TABLET    Take 650 mg by mouth daily.     TICAGRELOR (BRILINTA) 90 MG TABS TABLET    TAKE 1 TABLET (90 MG TOTAL) BY MOUTH 2 (TWO) TIMES DAILY. Resume on 03/02/15  Modified Medications   No medications on file  Discontinued Medications   CEPHALEXIN (KEFLEX) 500 MG CAPSULE    Take 1 capsule (500 mg total) by mouth 2 (two) times daily.   OVER THE COUNTER MEDICATION    Place 1 drop into both eyes 3 (three) times daily as needed (dry eyes). Over the counter eye drops for dry eyes   SULFAMETHOXAZOLE-TRIMETHOPRIM (BACTRIM DS,SEPTRA DS) 800-160 MG TABLET    Take 1 tablet by mouth 2 (two) times daily.     Physical Exam: Filed Vitals:   05/01/15 1109  BP: 114/65  Pulse: 69  Temp: 97.9 F (36.6 C)  TempSrc: Oral  Resp: 18    Physical Exam  Constitutional: She appears well-developed and well-nourished. No distress.  HENT:  Head: Normocephalic and atraumatic.  Eyes: Conjunctivae are normal. Pupils are equal, round, and reactive to light.  Neck: Normal range of motion. Neck supple.  Cardiovascular: Normal rate, regular rhythm and normal heart sounds.   Pulmonary/Chest: Effort normal and breath sounds normal.  Abdominal: Soft. Bowel sounds are normal.  Musculoskeletal: She exhibits no edema or tenderness.  Neurological:  She is alert.  Skin: Skin is warm and dry. She is not diaphoretic.  Well healed pacemaker incision. No tenderness, drainage or warmth.   Psychiatric: She has a normal mood and affect.    Labs reviewed: Basic Metabolic Panel:  Recent Labs  02/23/15 1718  02/23/15 2213  04/20/15 0331 04/21/15 0548 04/21/15 1225 04/22/15 0532  NA 144  < > 145  < > 142 143  --  141  K 4.1  < > 3.9  < > 4.8 4.4  --  4.2  CL 114*  < > 112*  < > 113* 114*  --  110  CO2 22  --  24  < > 18* 22  --  18*  GLUCOSE 168*  < > 124*  < > 92 79  --  65  BUN 40*  < > 39*  < > 26* 25*  --  23*  CREATININE 2.63*  < > 2.53*  < > 2.72* 2.60* 2.49* 2.32*  CALCIUM 8.8*  --  9.2  < > 9.4 9.2  --  8.8*  MG 2.0  --  2.1  --   --   --   --   --   < > = values in this interval not displayed. Liver Function Tests:  Recent Labs  11/30/14 1328 02/23/15 1718 02/23/15 2213  AST ALT 13* 13* 16  ALKPHOS 117 123 131*  BILITOT 0.7 0.4 0.4  PROT 6.3* 5.2* 5.2*  ALBUMIN 3.0* 2.8* 2.8*   No results for input(s): LIPASE, AMYLASE in the last 8760 hours. No results for input(s): AMMONIA in the last 8760 hours. CBC:  Recent Labs  09/19/14 1230  02/23/15 1718  03/01/15 1558 04/17/15 1244 04/21/15 1225  WBC 3.9*  < > 4.5  < > 4.5 4.6 4.0  NEUTROABS 2.3  --  3.0  --   --   --   --   HGB 10.7*  < > 9.0*  < > 9.6* 9.5* 8.3*  HCT 33.6*  < > 28.8*  < > 30.6* 30.0* 25.9*  MCV 80.8  < > 82.3  < > 81.6 82.2 81.4  PLT 129*  < > 112*  < > 182 180 130*  < > = values in this interval not displayed. TSH:  Recent Labs  02/23/15 1718 02/23/15 2213  TSH 2.650 2.231   A1C: Lab Results  Component Value Date   HGBA1C 5.9* 02/23/2015   Lipid Panel: No results for input(s): CHOL, HDL, LDLCALC, TRIG, CHOLHDL, LDLDIRECT in the last 8760 hours.  Radiological Exams: Dg Chest 2 View  04/18/2015  CLINICAL DATA:  Pacemaker placement EXAM: CHEST  2 VIEW COMPARISON:  03/01/2015 chest radiograph FINDINGS: Single lead left  internal jugular pacemaker is noted with lead tip overlying the right ventricular apex. Median sternotomy wires are aligned and intact. Stable cardiomediastinal silhouette with mild cardiomegaly. No pneumothorax. No pleural effusion. No overt pulmonary edema. Mild curvilinear opacities in the peripheral right mid lung and at the lung bases. IMPRESSION: 1. No pneumothorax. Well-positioned single lead left internal jugular pacemaker. 2. Stable mild cardiomegaly without overt pulmonary edema. 3. Mild curvilinear opacities in the peripheral right mid lung and at both lung bases, favor mild scarring and/or atelectasis. Electronically Signed   By: Delbert Phenix M.D.   On: 04/18/2015 08:31   Assessment/Plan 1. Abscess of chest wall underwent pacemaker system extraction with insertion of temp-perm pacemaker. She had several days of IV antibiotics and then underwent reimplantation of a MDT dual chamber pacemaker on 04-21-15 by Dr Ladona Ridgel. Pt doing well post-op. Ongoing cardiology follow up.   2. Hypertensive heart disease with CHF (congestive heart failure) (HCC) -CHF remains stable, euvolic today. conts on coreg twice daily and lasix 40 mg  daily   3. Hypothyroidism due to acquired atrophy of thyroid conts on synthroid 100 mcg daily   4. Atherosclerosis of coronary artery bypass graft of native heart without angina pectoris -denies chest pains, conts on ticagrelor 90 mg daily along with coreg and welchol   5. Gastroesophageal reflux disease without esophagitis -stable, conts on protonix   6. Dyslipidemia with statin intolerance -conts on welchol BID  7. Generalized weakness -remains weak but family has opted to take her home with 24 hour care. pt is stable for discharge-will need PT/OT per home health. DME needed includes WC, knee brace. Rx written.  will need to follow up with PCP within 2 weeks.     Janene Harvey. Biagio Borg  [facility] [phone] 8 am -  5 pm) [phone] (after hours)

## 2015-05-02 DIAGNOSIS — F0390 Unspecified dementia without behavioral disturbance: Secondary | ICD-10-CM | POA: Diagnosis not present

## 2015-05-02 DIAGNOSIS — I130 Hypertensive heart and chronic kidney disease with heart failure and stage 1 through stage 4 chronic kidney disease, or unspecified chronic kidney disease: Secondary | ICD-10-CM | POA: Diagnosis not present

## 2015-05-02 DIAGNOSIS — Z48812 Encounter for surgical aftercare following surgery on the circulatory system: Secondary | ICD-10-CM | POA: Diagnosis not present

## 2015-05-02 DIAGNOSIS — I2510 Atherosclerotic heart disease of native coronary artery without angina pectoris: Secondary | ICD-10-CM | POA: Diagnosis not present

## 2015-05-02 DIAGNOSIS — I509 Heart failure, unspecified: Secondary | ICD-10-CM | POA: Diagnosis not present

## 2015-05-04 DIAGNOSIS — I2510 Atherosclerotic heart disease of native coronary artery without angina pectoris: Secondary | ICD-10-CM | POA: Diagnosis not present

## 2015-05-04 DIAGNOSIS — F0390 Unspecified dementia without behavioral disturbance: Secondary | ICD-10-CM | POA: Diagnosis not present

## 2015-05-04 DIAGNOSIS — I130 Hypertensive heart and chronic kidney disease with heart failure and stage 1 through stage 4 chronic kidney disease, or unspecified chronic kidney disease: Secondary | ICD-10-CM | POA: Diagnosis not present

## 2015-05-04 DIAGNOSIS — Z48812 Encounter for surgical aftercare following surgery on the circulatory system: Secondary | ICD-10-CM | POA: Diagnosis not present

## 2015-05-04 DIAGNOSIS — I509 Heart failure, unspecified: Secondary | ICD-10-CM | POA: Diagnosis not present

## 2015-05-05 DIAGNOSIS — I2510 Atherosclerotic heart disease of native coronary artery without angina pectoris: Secondary | ICD-10-CM | POA: Diagnosis not present

## 2015-05-05 DIAGNOSIS — I130 Hypertensive heart and chronic kidney disease with heart failure and stage 1 through stage 4 chronic kidney disease, or unspecified chronic kidney disease: Secondary | ICD-10-CM | POA: Diagnosis not present

## 2015-05-05 DIAGNOSIS — Z48812 Encounter for surgical aftercare following surgery on the circulatory system: Secondary | ICD-10-CM | POA: Diagnosis not present

## 2015-05-05 DIAGNOSIS — F0390 Unspecified dementia without behavioral disturbance: Secondary | ICD-10-CM | POA: Diagnosis not present

## 2015-05-05 DIAGNOSIS — I509 Heart failure, unspecified: Secondary | ICD-10-CM | POA: Diagnosis not present

## 2015-05-06 DIAGNOSIS — M549 Dorsalgia, unspecified: Secondary | ICD-10-CM | POA: Diagnosis not present

## 2015-05-06 DIAGNOSIS — E032 Hypothyroidism due to medicaments and other exogenous substances: Secondary | ICD-10-CM | POA: Diagnosis not present

## 2015-05-07 DIAGNOSIS — F0390 Unspecified dementia without behavioral disturbance: Secondary | ICD-10-CM | POA: Diagnosis not present

## 2015-05-07 DIAGNOSIS — I509 Heart failure, unspecified: Secondary | ICD-10-CM | POA: Diagnosis not present

## 2015-05-07 DIAGNOSIS — I130 Hypertensive heart and chronic kidney disease with heart failure and stage 1 through stage 4 chronic kidney disease, or unspecified chronic kidney disease: Secondary | ICD-10-CM | POA: Diagnosis not present

## 2015-05-07 DIAGNOSIS — Z48812 Encounter for surgical aftercare following surgery on the circulatory system: Secondary | ICD-10-CM | POA: Diagnosis not present

## 2015-05-07 DIAGNOSIS — I2510 Atherosclerotic heart disease of native coronary artery without angina pectoris: Secondary | ICD-10-CM | POA: Diagnosis not present

## 2015-05-08 DIAGNOSIS — L03119 Cellulitis of unspecified part of limb: Secondary | ICD-10-CM | POA: Diagnosis not present

## 2015-05-08 DIAGNOSIS — A4102 Sepsis due to Methicillin resistant Staphylococcus aureus: Secondary | ICD-10-CM | POA: Diagnosis not present

## 2015-05-11 DIAGNOSIS — I509 Heart failure, unspecified: Secondary | ICD-10-CM | POA: Diagnosis not present

## 2015-05-11 DIAGNOSIS — F0390 Unspecified dementia without behavioral disturbance: Secondary | ICD-10-CM | POA: Diagnosis not present

## 2015-05-11 DIAGNOSIS — I130 Hypertensive heart and chronic kidney disease with heart failure and stage 1 through stage 4 chronic kidney disease, or unspecified chronic kidney disease: Secondary | ICD-10-CM | POA: Diagnosis not present

## 2015-05-11 DIAGNOSIS — Z48812 Encounter for surgical aftercare following surgery on the circulatory system: Secondary | ICD-10-CM | POA: Diagnosis not present

## 2015-05-11 DIAGNOSIS — I2510 Atherosclerotic heart disease of native coronary artery without angina pectoris: Secondary | ICD-10-CM | POA: Diagnosis not present

## 2015-05-12 DIAGNOSIS — F0390 Unspecified dementia without behavioral disturbance: Secondary | ICD-10-CM | POA: Diagnosis not present

## 2015-05-12 DIAGNOSIS — I509 Heart failure, unspecified: Secondary | ICD-10-CM | POA: Diagnosis not present

## 2015-05-12 DIAGNOSIS — Z48812 Encounter for surgical aftercare following surgery on the circulatory system: Secondary | ICD-10-CM | POA: Diagnosis not present

## 2015-05-12 DIAGNOSIS — I2510 Atherosclerotic heart disease of native coronary artery without angina pectoris: Secondary | ICD-10-CM | POA: Diagnosis not present

## 2015-05-12 DIAGNOSIS — I130 Hypertensive heart and chronic kidney disease with heart failure and stage 1 through stage 4 chronic kidney disease, or unspecified chronic kidney disease: Secondary | ICD-10-CM | POA: Diagnosis not present

## 2015-05-13 ENCOUNTER — Ambulatory Visit (INDEPENDENT_AMBULATORY_CARE_PROVIDER_SITE_OTHER): Payer: Medicare Other | Admitting: Podiatry

## 2015-05-13 ENCOUNTER — Encounter: Payer: Self-pay | Admitting: Podiatry

## 2015-05-13 DIAGNOSIS — B351 Tinea unguium: Secondary | ICD-10-CM | POA: Diagnosis not present

## 2015-05-13 DIAGNOSIS — F0390 Unspecified dementia without behavioral disturbance: Secondary | ICD-10-CM | POA: Diagnosis not present

## 2015-05-13 DIAGNOSIS — M79676 Pain in unspecified toe(s): Secondary | ICD-10-CM | POA: Diagnosis not present

## 2015-05-13 DIAGNOSIS — Z48812 Encounter for surgical aftercare following surgery on the circulatory system: Secondary | ICD-10-CM | POA: Diagnosis not present

## 2015-05-13 DIAGNOSIS — I509 Heart failure, unspecified: Secondary | ICD-10-CM | POA: Diagnosis not present

## 2015-05-13 DIAGNOSIS — I2510 Atherosclerotic heart disease of native coronary artery without angina pectoris: Secondary | ICD-10-CM | POA: Diagnosis not present

## 2015-05-13 DIAGNOSIS — I130 Hypertensive heart and chronic kidney disease with heart failure and stage 1 through stage 4 chronic kidney disease, or unspecified chronic kidney disease: Secondary | ICD-10-CM | POA: Diagnosis not present

## 2015-05-13 NOTE — Progress Notes (Signed)
Patient ID: WINNER BERGLAND, female   DOB: 1921/05/23, 81 y.o.   MRN: [id number]   subjective:  this patient presents for scheduled visit complaining of toenails that are uncomfortable walking wearing shoes and requests nail debridement    objective:  orientated 3  patient's daughter present treatment room  the toenails are elongated, brittle, discolored, incurvated , hypertrophic and tender to palpation 6-10   Assessment:  symptomatic onychomycoses 6-10  type II diabetic   Plan:  debridement toenails 10 mechanically and electrically. Small bleeding fourth right toe after debridement treated with topical antibiotic ointment and Band-Aid. Patient and daughter advised removed Band-Aid on the fourth right toe 1-3 days and apply topical antibiotic ointment to the area daily until a scab forms  Reappoint 3 months

## 2015-05-13 NOTE — Patient Instructions (Signed)
Removed Band-Aid on fourth right toe 1-3 days. Apply topical antibiotic ointment  Daily until a scab forms  Diabetes and Foot Care Diabetes may cause you to have problems because of poor blood supply (circulation) to your feet and legs. This may cause the skin on your feet to become thinner, break easier, and heal more slowly. Your skin may become dry, and the skin may peel and crack. You may also have nerve damage in your legs and feet causing decreased feeling in them. You may not notice minor injuries to your feet that could lead to infections or more serious problems. Taking care of your feet is one of the most important things you can do for yourself.  HOME CARE INSTRUCTIONS  Wear shoes at all times, even in the house. Do not go barefoot. Bare feet are easily injured.  Check your feet daily for blisters, cuts, and redness. If you cannot see the bottom of your feet, use a mirror or ask someone for help.  Wash your feet with warm water (do not use hot water) and mild soap. Then pat your feet and the areas between your toes until they are completely dry. Do not soak your feet as this can dry your skin.  Apply a moisturizing lotion or petroleum jelly (that does not contain alcohol and is unscented) to the skin on your feet and to dry, brittle toenails. Do not apply lotion between your toes.  Trim your toenails straight across. Do not dig under them or around the cuticle. File the edges of your nails with an emery board or nail file.  Do not cut corns or calluses or try to remove them with medicine.  Wear clean socks or stockings every day. Make sure they are not too tight. Do not wear knee-high stockings since they may decrease blood flow to your legs.  Wear shoes that fit properly and have enough cushioning. To break in new shoes, wear them for just a few hours a day. This prevents you from injuring your feet. Always look in your shoes before you put them on to be sure there are no objects  inside.  Do not cross your legs. This may decrease the blood flow to your feet.  If you find a minor scrape, cut, or break in the skin on your feet, keep it and the skin around it clean and dry. These areas may be cleansed with mild soap and water. Do not cleanse the area with peroxide, alcohol, or iodine.  When you remove an adhesive bandage, be sure not to damage the skin around it.  If you have a wound, look at it several times a day to make sure it is healing.  Do not use heating pads or hot water bottles. They may burn your skin. If you have lost feeling in your feet or legs, you may not know it is happening until it is too late.  Make sure your health care provider performs a complete foot exam at least annually or more often if you have foot problems. Report any cuts, sores, or bruises to your health care provider immediately. SEEK MEDICAL CARE IF:   You have an injury that is not healing.  You have cuts or breaks in the skin.  You have an ingrown nail.  You notice redness on your legs or feet.  You feel burning or tingling in your legs or feet.  You have pain or cramps in your legs and feet.  Your legs or feet  are numb.  Your feet always feel cold. SEEK IMMEDIATE MEDICAL CARE IF:   There is increasing redness, swelling, or pain in or around a wound.  There is a red line that goes up your leg.  Pus is coming from a wound.  You develop a fever or as directed by your health care provider.  You notice a bad smell coming from an ulcer or wound.   This information is not intended to replace advice given to you by your health care provider. Make sure you discuss any questions you have with your health care provider.   Document Released: 06/24/2000 Document Revised: 02/27/2013 Document Reviewed: 12/04/2012 Elsevier Interactive Patient Education [employer].

## 2015-05-14 DIAGNOSIS — Z48812 Encounter for surgical aftercare following surgery on the circulatory system: Secondary | ICD-10-CM | POA: Diagnosis not present

## 2015-05-14 DIAGNOSIS — I130 Hypertensive heart and chronic kidney disease with heart failure and stage 1 through stage 4 chronic kidney disease, or unspecified chronic kidney disease: Secondary | ICD-10-CM | POA: Diagnosis not present

## 2015-05-14 DIAGNOSIS — I509 Heart failure, unspecified: Secondary | ICD-10-CM | POA: Diagnosis not present

## 2015-05-14 DIAGNOSIS — F0390 Unspecified dementia without behavioral disturbance: Secondary | ICD-10-CM | POA: Diagnosis not present

## 2015-05-14 DIAGNOSIS — I2510 Atherosclerotic heart disease of native coronary artery without angina pectoris: Secondary | ICD-10-CM | POA: Diagnosis not present

## 2015-05-18 DIAGNOSIS — I130 Hypertensive heart and chronic kidney disease with heart failure and stage 1 through stage 4 chronic kidney disease, or unspecified chronic kidney disease: Secondary | ICD-10-CM | POA: Diagnosis not present

## 2015-05-18 DIAGNOSIS — I2510 Atherosclerotic heart disease of native coronary artery without angina pectoris: Secondary | ICD-10-CM | POA: Diagnosis not present

## 2015-05-18 DIAGNOSIS — Z48812 Encounter for surgical aftercare following surgery on the circulatory system: Secondary | ICD-10-CM | POA: Diagnosis not present

## 2015-05-18 DIAGNOSIS — F0390 Unspecified dementia without behavioral disturbance: Secondary | ICD-10-CM | POA: Diagnosis not present

## 2015-05-18 DIAGNOSIS — I509 Heart failure, unspecified: Secondary | ICD-10-CM | POA: Diagnosis not present

## 2015-05-19 DIAGNOSIS — I509 Heart failure, unspecified: Secondary | ICD-10-CM | POA: Diagnosis not present

## 2015-05-19 DIAGNOSIS — F0390 Unspecified dementia without behavioral disturbance: Secondary | ICD-10-CM | POA: Diagnosis not present

## 2015-05-19 DIAGNOSIS — I2510 Atherosclerotic heart disease of native coronary artery without angina pectoris: Secondary | ICD-10-CM | POA: Diagnosis not present

## 2015-05-19 DIAGNOSIS — I130 Hypertensive heart and chronic kidney disease with heart failure and stage 1 through stage 4 chronic kidney disease, or unspecified chronic kidney disease: Secondary | ICD-10-CM | POA: Diagnosis not present

## 2015-05-19 DIAGNOSIS — Z48812 Encounter for surgical aftercare following surgery on the circulatory system: Secondary | ICD-10-CM | POA: Diagnosis not present

## 2015-05-21 DIAGNOSIS — Z48812 Encounter for surgical aftercare following surgery on the circulatory system: Secondary | ICD-10-CM | POA: Diagnosis not present

## 2015-05-21 DIAGNOSIS — I130 Hypertensive heart and chronic kidney disease with heart failure and stage 1 through stage 4 chronic kidney disease, or unspecified chronic kidney disease: Secondary | ICD-10-CM | POA: Diagnosis not present

## 2015-05-21 DIAGNOSIS — I2510 Atherosclerotic heart disease of native coronary artery without angina pectoris: Secondary | ICD-10-CM | POA: Diagnosis not present

## 2015-05-21 DIAGNOSIS — I509 Heart failure, unspecified: Secondary | ICD-10-CM | POA: Diagnosis not present

## 2015-05-21 DIAGNOSIS — F0390 Unspecified dementia without behavioral disturbance: Secondary | ICD-10-CM | POA: Diagnosis not present

## 2015-05-22 DIAGNOSIS — Z48812 Encounter for surgical aftercare following surgery on the circulatory system: Secondary | ICD-10-CM | POA: Diagnosis not present

## 2015-05-22 DIAGNOSIS — I130 Hypertensive heart and chronic kidney disease with heart failure and stage 1 through stage 4 chronic kidney disease, or unspecified chronic kidney disease: Secondary | ICD-10-CM | POA: Diagnosis not present

## 2015-05-22 DIAGNOSIS — I2510 Atherosclerotic heart disease of native coronary artery without angina pectoris: Secondary | ICD-10-CM | POA: Diagnosis not present

## 2015-05-22 DIAGNOSIS — F0390 Unspecified dementia without behavioral disturbance: Secondary | ICD-10-CM | POA: Diagnosis not present

## 2015-05-22 DIAGNOSIS — I509 Heart failure, unspecified: Secondary | ICD-10-CM | POA: Diagnosis not present

## 2015-05-25 DIAGNOSIS — I130 Hypertensive heart and chronic kidney disease with heart failure and stage 1 through stage 4 chronic kidney disease, or unspecified chronic kidney disease: Secondary | ICD-10-CM | POA: Diagnosis not present

## 2015-05-25 DIAGNOSIS — I2510 Atherosclerotic heart disease of native coronary artery without angina pectoris: Secondary | ICD-10-CM | POA: Diagnosis not present

## 2015-05-25 DIAGNOSIS — Z48812 Encounter for surgical aftercare following surgery on the circulatory system: Secondary | ICD-10-CM | POA: Diagnosis not present

## 2015-05-25 DIAGNOSIS — F0390 Unspecified dementia without behavioral disturbance: Secondary | ICD-10-CM | POA: Diagnosis not present

## 2015-05-25 DIAGNOSIS — I509 Heart failure, unspecified: Secondary | ICD-10-CM | POA: Diagnosis not present

## 2015-05-27 DIAGNOSIS — I2510 Atherosclerotic heart disease of native coronary artery without angina pectoris: Secondary | ICD-10-CM | POA: Diagnosis not present

## 2015-05-27 DIAGNOSIS — I509 Heart failure, unspecified: Secondary | ICD-10-CM | POA: Diagnosis not present

## 2015-05-27 DIAGNOSIS — I130 Hypertensive heart and chronic kidney disease with heart failure and stage 1 through stage 4 chronic kidney disease, or unspecified chronic kidney disease: Secondary | ICD-10-CM | POA: Diagnosis not present

## 2015-05-27 DIAGNOSIS — Z48812 Encounter for surgical aftercare following surgery on the circulatory system: Secondary | ICD-10-CM | POA: Diagnosis not present

## 2015-05-27 DIAGNOSIS — F0390 Unspecified dementia without behavioral disturbance: Secondary | ICD-10-CM | POA: Diagnosis not present

## 2015-05-28 DIAGNOSIS — F0390 Unspecified dementia without behavioral disturbance: Secondary | ICD-10-CM | POA: Diagnosis not present

## 2015-05-28 DIAGNOSIS — Z48812 Encounter for surgical aftercare following surgery on the circulatory system: Secondary | ICD-10-CM | POA: Diagnosis not present

## 2015-05-28 DIAGNOSIS — I2510 Atherosclerotic heart disease of native coronary artery without angina pectoris: Secondary | ICD-10-CM | POA: Diagnosis not present

## 2015-05-28 DIAGNOSIS — I509 Heart failure, unspecified: Secondary | ICD-10-CM | POA: Diagnosis not present

## 2015-05-28 DIAGNOSIS — I130 Hypertensive heart and chronic kidney disease with heart failure and stage 1 through stage 4 chronic kidney disease, or unspecified chronic kidney disease: Secondary | ICD-10-CM | POA: Diagnosis not present

## 2015-06-01 DIAGNOSIS — Z48812 Encounter for surgical aftercare following surgery on the circulatory system: Secondary | ICD-10-CM | POA: Diagnosis not present

## 2015-06-01 DIAGNOSIS — I130 Hypertensive heart and chronic kidney disease with heart failure and stage 1 through stage 4 chronic kidney disease, or unspecified chronic kidney disease: Secondary | ICD-10-CM | POA: Diagnosis not present

## 2015-06-01 DIAGNOSIS — F0390 Unspecified dementia without behavioral disturbance: Secondary | ICD-10-CM | POA: Diagnosis not present

## 2015-06-01 DIAGNOSIS — I509 Heart failure, unspecified: Secondary | ICD-10-CM | POA: Diagnosis not present

## 2015-06-01 DIAGNOSIS — I2510 Atherosclerotic heart disease of native coronary artery without angina pectoris: Secondary | ICD-10-CM | POA: Diagnosis not present

## 2015-06-02 ENCOUNTER — Ambulatory Visit (INDEPENDENT_AMBULATORY_CARE_PROVIDER_SITE_OTHER): Payer: Medicare Other | Admitting: Cardiovascular Disease

## 2015-06-02 ENCOUNTER — Encounter: Payer: Medicare Other | Admitting: Cardiovascular Disease

## 2015-06-02 ENCOUNTER — Encounter: Payer: Self-pay | Admitting: Cardiovascular Disease

## 2015-06-02 ENCOUNTER — Other Ambulatory Visit (HOSPITAL_COMMUNITY)
Admission: RE | Admit: 2015-06-02 | Discharge: 2015-06-02 | Disposition: A | Discharge status: Home / Self Care | Payer: Medicare Other | Source: Ambulatory Visit | Attending: Cardiology | Admitting: Cardiology

## 2015-06-02 VITALS — BP 102/74 | HR 60 | Ht 61.0 in | Wt 137.3 lb

## 2015-06-02 DIAGNOSIS — I2510 Atherosclerotic heart disease of native coronary artery without angina pectoris: Secondary | ICD-10-CM

## 2015-06-02 DIAGNOSIS — I442 Atrioventricular block, complete: Secondary | ICD-10-CM | POA: Diagnosis not present

## 2015-06-02 DIAGNOSIS — I25810 Atherosclerosis of coronary artery bypass graft(s) without angina pectoris: Secondary | ICD-10-CM

## 2015-06-02 DIAGNOSIS — T827XXD Infection and inflammatory reaction due to other cardiac and vascular devices, implants and grafts, subsequent encounter: Secondary | ICD-10-CM | POA: Insufficient documentation

## 2015-06-02 DIAGNOSIS — I480 Paroxysmal atrial fibrillation: Secondary | ICD-10-CM

## 2015-06-02 DIAGNOSIS — T827XXS Infection and inflammatory reaction due to other cardiac and vascular devices, implants and grafts, sequela: Secondary | ICD-10-CM

## 2015-06-02 DIAGNOSIS — I110 Hypertensive heart disease with heart failure: Secondary | ICD-10-CM

## 2015-06-02 DIAGNOSIS — I255 Ischemic cardiomyopathy: Secondary | ICD-10-CM | POA: Diagnosis not present

## 2015-06-02 NOTE — Progress Notes (Signed)
Patient ID: [name], female   DOB: [birth year], 81 y.o.   MRN: [id number]      Cardiology Office Note   Date:  06/03/2015   ID:  [name], DOB 25-Sep-1920, MRN [id number]  PCP:  Michiel Sites, MD  Cardiologist:  Lewayne Bunting, MD  Thurmon Fair, MD   Chief Complaint  Patient presents with  . Follow-up    No complaints of chest pain, SOB or dizziness. Swelling of both legs and feet.      History of Present Illness: [name] is a 81 y.o. female who presents for CAD and pacemaker follow-up, roughly 6 weeks following reimplantation of a dual-chamber permanent pacemaker in the right subclavian area for complete heart block, following early pocket infection of her initial left subclavian pacemaker. She is on chronic dual antiplatelet therapy since she is felt to be at high risk for stent reocclusion, but her last revascularization procedure was performed about 3 years ago.  Has not had any fever or chills since the new pacemaker was implanted. The surgical site shows a healthy incision scar that is almost completely healed, but the pocket is fluctuant. The area is not tender or red in the pocket is not tense. The patient's daughter states that the pacemaker site had been flat when she last looked at it about a week ago, but did not notice until today that the pocket was swollen. She fell during physical therapy yesterday. The patient thinks that the pocket has been swollen for several days.  Device function is normal. She has 69% atrial pacing and 99% ventricular pacing (pacemaker dependent due to complete heart block). Currently the [employer] sense generator has an estimated longevity of about 8 years. No episodes of atrial fibrillation or ventricular tachycardia have been recorded. Heart rate histogram is very blunted, but she is extremely sedentary. Lead parameters are excellent.  Past Medical History  Diagnosis Date  . ST elevation myocardial infarction  (STEMI) of inferior wall (HCC) 05/2011; 03/10/2012    a) 11/'12: 100% RCA, dLM 80% -- POBA of RCA (for planned CABG, not done until re-admission with NSTEMI 1/'13);; b) 8/'13: 100% SVG-RCA (PCI & re-PCI), native RCA 100%; Patent LIMA-p-mLAD, SVG-OM  . CAD S/P percutaneous coronary angioplasty 11/'12; 8/31 & 9/1/'13    a) MV: 100% RCA-POBA, dLM left main 80%; b) 1/'13: NSTEMI --> CABG; c) 2013: 8/31 - 100% RCA & acute SVG-RCA, 100% SVG-OM, 90% LM, 80% p&mLAD, ~70% RI, 50% Cx --> PCI-SVG-RCA: Promus Element DES x 3 (prox 2.5 mm x 38 mm & 2.5 mm x 16 mm, distal 2.5 mm x 16 mm); on 9/1 - accute in-stent Thrombosis - Aspiration thrombectomy & PTCA  . S/P CABG x 4 08/05/11    Dr. Tyrone Sage: LIMA-p-mLAD, SVG-RPDA, SVG-OM; known  100% SVG-OM, Extensive PTCA of SVG-RCA;;  Hospital course complicated by Afib & PNA; after d/c cellulitis of SVG harvest site due to edema  . Episodic atrial fibrillation (HCC) 07/2011    Post-Op CABG  . Ischemic cardiomyopathy 03/2012    Echo: EF 45-50% - Mild basal- mid inferolateral Hypokinesis: Gr 1 DD, mildly increased PAP.  Marland Kitchen Dyslipidemia, goal LDL below 70     statin intolerance (lipitor, crestor drug reaction); Welchol & Zetia  . Hypertension, essential   . DM (diabetes mellitus), type 2 with renal complications (HCC)   . Chronic kidney disease (CKD) stage G3b/A1, moderately decreased glomerular filtration rate (GFR) between 30-44 mL/min/1.73 square meter and albuminuria creatinine ratio less than 30 mg/g   .  History of pneumonia     post-op from CABG  . Hypothyroidism     on synthroid  . Headache(784.0)   . Osteoarthritis   . Anxiety     PRN Xanax   . Dermatophytosis of nail   . Gout     on Allopurinol  . Lower extremity edema     chronic  . GERD (gastroesophageal reflux disease) 04/26/2015    Past Surgical History  Procedure Laterality Date  . Cardiac catheterization  November 2012; August and September 2013    Patent LIMA-LAD, patent she had an OM, patent  stented SVG-RCA; occluded native RCA, 90% ostial left main, tandem 80% proximal and mid LAD, 70% mid Circumflex  . Coronary angioplasty  November 2012    PTCA only of RCA and setting of inferior STEMI  . Coronary artery bypass graft  08/05/2011    Procedure: CORONARY ARTERY BYPASS GRAFTING (CABG);  Surgeon: [name], MD;  Location: [facility] OR;  Service: Open Heart Surgery;  Laterality: N/A;  coronary artery bypass graft times 4 using left internal mammary artery and right leg saphenous vein harvested endoscopically  . Joint replacement      both hips replaced  . I&d extremity  09/21/2011    Procedure: IRRIGATION AND DEBRIDEMENT EXTREMITY;  Surgeon: [name], MD;  Location: [facility] OR;  Service: Vascular;  Laterality: Right;  with wound vac placement  . Coronary angioplasty with stent placement  03/10/2012     infferior STEMI: Occluded native RCA and SVG-RCA --> thrombectomy and 3 stent placement to the SVG-RCA (2.5 mm right 38 mm and 2 proximal distal overlapping 2.5 mm x 16 mm Promus DES stents:  . Coronary angioplasty  03/11/2012    Inferior STEMI #3: Reoccluded SVG-RCA; extensive thrombectomy and post dilation PTCA  . Transthoracic echocardiogram  September 2013    EF 45-50%, mild hypokinesis of the basal and mid inferolateral wall, grade 1 diastolic dysfunction, mildly increased artery pressures.  . Left heart catheterization with coronary angiogram N/A 05/26/2011    Procedure: LEFT HEART CATHETERIZATION WITH CORONARY ANGIOGRAM;  Surgeon: Marykay Lex, MD;  Location: [facility] CATH LAB;  Service: Cardiovascular;  Laterality: N/A;  . Percutaneous coronary intervention-balloon only N/A 05/26/2011    Procedure: PERCUTANEOUS CORONARY INTERVENTION-BALLOON ONLY;  Surgeon: Marykay Lex, MD;  Location: [facility] CATH LAB;  Service: Cardiovascular;  Laterality: N/A;  . Left heart cath N/A 03/10/2012    Procedure: LEFT HEART CATH;  Surgeon: Herby Abraham, MD;  Location: [facility] CATH LAB;  Service:  Cardiovascular;  Laterality: N/A;  . Percutaneous coronary stent intervention (pci-s)  03/10/2012    Procedure: PERCUTANEOUS CORONARY STENT INTERVENTION (PCI-S);  Surgeon: Herby Abraham, MD;  Location: [facility] CATH LAB;  Service: Cardiovascular;;  . Left heart catheterization with coronary angiogram Bilateral 03/11/2012    Procedure: LEFT HEART CATHETERIZATION WITH CORONARY ANGIOGRAM;  Surgeon: Thurmon Fair, MD;  Location: [facility] CATH LAB;  Service: Cardiovascular;  Laterality: Bilateral;  . Ep implantable device N/A 02/24/2015    Procedure: Pacemaker Implant;  Surgeon: Thurmon Fair, MD;  Location: [facility] INVASIVE CV LAB;  Service: Cardiovascular;  Laterality: N/A;  . Ep implantable device N/A 04/17/2015    Procedure: Lead Extraction, Can Extraction;  Surgeon: Marinus Maw, MD;  Location: [facility] INVASIVE CV LAB;  Service: Cardiovascular;  Laterality: N/A;  . Cardiac catheterization N/A 04/17/2015    Procedure: Temporary Pacemaker;  Surgeon: Marinus Maw, MD;  Location: [facility] INVASIVE CV LAB;  Service: Cardiovascular;  Laterality: N/A;  .  Ep implantable device N/A 04/21/2015    Procedure: Pacemaker Implant;  Surgeon: Marinus Maw, MD;  Location: [facility] INVASIVE CV LAB;  Service: Cardiovascular;  Laterality: N/A;  . Pacemaker lead removal  04/21/2015    Procedure: Pacemaker Lead Removal;  Surgeon: Marinus Maw, MD;  Location: [facility] INVASIVE CV LAB;  Service: Cardiovascular;;  LV      Current Outpatient Prescriptions  Medication Sig Dispense Refill  . acetaminophen (TYLENOL) 325 MG tablet Take 650 mg by mouth 2 (two) times daily.    Marland Kitchen allopurinol (ZYLOPRIM) 100 MG tablet Take 100 mg by mouth daily.     . carvedilol (COREG) 6.25 MG tablet Take 1 tablet (6.25 mg total) by mouth 2 (two) times daily with a meal. 60 tablet 6  . colesevelam (WELCHOL) 625 MG tablet Take 1,875 mg by mouth See admin instructions. Take 3 tablets (1875 mg) with breakfast and with lunch    . furosemide (LASIX) 40 MG tablet Take 40 mg by  mouth daily.     Marland Kitchen levothyroxine (SYNTHROID, LEVOTHROID) 100 MCG tablet Take 100 mcg by mouth daily.     Marland Kitchen LORazepam (ATIVAN) 0.5 MG tablet Take 0.5 mg by mouth 3 (three) times daily.     . pantoprazole (PROTONIX) 40 MG tablet Take 40 mg by mouth daily.  12  . polyvinyl alcohol (LIQUIFILM TEARS) 1.4 % ophthalmic solution Place 1 drop into both eyes 3 (three) times daily.    . sodium bicarbonate 650 MG tablet Take 650 mg by mouth daily.      . ticagrelor (BRILINTA) 90 MG TABS tablet TAKE 1 TABLET (90 MG TOTAL) BY MOUTH 2 (TWO) TIMES DAILY. Resume on 03/02/15 60 tablet 4   No current facility-administered medications for this visit.    Allergies:   Statins and Tape    Social History:  The patient  reports that she has never smoked. She has never used smokeless tobacco. She reports that she drinks about 0.6 oz of alcohol per week. She reports that she does not use illicit drugs.   Family History:  The patient's family history includes Breast cancer in her mother; Heart disease in her father.    ROS:  Please see the history of present illness.    Otherwise, review of systems positive for none.   All other systems are reviewed and negative.    PHYSICAL EXAM: VS:  BP 102/74 mmHg  Pulse 60  Ht  (1.549 m)  Wt 137 lb 4.8 oz (62.279 kg)  BMI 25.96 kg/m2 , BMI Body mass index is 25.96 kg/(m^2).  General: Alert, oriented x3, no distress Head: no evidence of trauma, PERRL, EOMI, no exophtalmos or lid lag, no myxedema, no xanthelasma; normal ears, nose and oropharynx Neck: normal jugular venous pulsations and no hepatojugular reflux; brisk carotid pulses without delay and no carotid bruits Chest: clear to auscultation, no signs of consolidation by percussion or palpation, normal fremitus, symmetrical and full respiratory excursions The old left subclavian pacemaker pocket has a fairly exuberant scar but no evidence of infection. The new right subclavian pacemaker pocket is slightly  distended and fluctuant, but is not red, warm or tender. The incision is almost completely healed with one tiny stitch related superficial defect in its midportion. There is no drainage. Cardiovascular: normal position and quality of the apical impulse, regular rhythm, normal first and second heart sounds, no murmurs, rubs or gallops Abdomen: no tenderness or distention, no masses by palpation, no abnormal pulsatility or arterial bruits, normal  bowel sounds, no hepatosplenomegaly Extremities: no clubbing, cyanosis or edema; 2+ radial, ulnar and brachial pulses bilaterally; 2+ right femoral, posterior tibial and dorsalis pedis pulses; 2+ left femoral, posterior tibial and dorsalis pedis pulses; no subclavian or femoral bruits Neurological: grossly nonfocal Psych: euthymic mood, full affect   EKG:  EKG is not ordered today.   Recent Labs: 02/23/2015: ALT 16; Magnesium 2.1; TSH 2.231 03/01/2015: B Natriuretic Peptide 224.5* 04/21/2015: Hemoglobin 8.3*; Platelets 130* 04/22/2015: BUN 23*; Creatinine, Ser 2.32*; Potassium 4.2; Sodium 141    Lipid Panel    Component Value Date/Time   CHOL 169 07/31/2011 0535   TRIG 64 07/31/2011 0535   HDL 68 07/31/2011 0535   CHOLHDL 2.5 07/31/2011 0535   VLDL 13 07/31/2011 0535   LDLCALC 88 07/31/2011 0535      Wt Readings from Last 3 Encounters:  06/02/15 137 lb 4.8 oz (62.279 kg)  05/01/15 145 lb 9.6 oz (66.044 kg)  04/22/15 149 lb 3.2 oz (67.677 kg)      Other studies Reviewed:  ASSESSMENT AND PLAN:  1. Complete heart block, pacemaker dependent 2. Dual-chamber permanent pacemaker. Reimplanted right subclavian pacemaker, following infection of initial left subclavian pacemaker. Fluid-filled right subclavian pacemaker pocket, hematoma versus infection. Other than the swelling there is no clinical sign of infection whatsoever. We'll draw a couple of blood cultures and have asked the patient's daughter to check the patient's temperature daily and  call us if she develops fever. At this point antibiotics do not appear to be indicated. Not withstanding the concerns regarding her coronary stents, will at least temporarily discontinued dual antiplatelet therapy. Continue aspirin and stop Brilinta. Reevaluate her in the device clinic in a couple of weeks. I have scheduled her with one of our device nurses on a day that Dr. Ladona Ridgel will hopefully be able to take a quick glance at the pacemaker pocket. 3. CAD s/p CABG and multiple stent procedures to both native and graft vessels most recently drug-eluting stents 3 to the saphenous vein graft to the right coronary artery. No current symptoms of coronary insufficiency. Plan to restart dual antiplatelet therapy once concern regarding possible pacemaker pocket bleeding has abated. 4. Chronic systolic and diastolic heart failure due to ischemic cardiomyopathy, currently euvolemic, compensated. Very sedentary 5. History of postoperative paroxysmal atrial fibrillation, no recurrence since pacemaker implantation has been detected.   Current medicines are reviewed at length with the patient today.  The patient does not have concerns regarding medicines.  The following changes have been made:  Stop Brilinta  Labs/ tests ordered today include:  Orders Placed This Encounter  Procedures  . Culture, blood (single)  . Culture, blood (single)    Patient Instructions  Your physician recommends that you return for lab work today: Blood cultures x 2  Your physician recommends that you schedule a follow-up appointment with the [facility] device clinic on 06/17/2015 @ 11:00am.  Dr. Royann Shivers recommends that you schedule a follow-up appointment in: 3 months    Signed, Thurmon Fair, MD  06/03/2015 11:22 AM    Thurmon Fair, MD, [facility] [phone] office [phone] pager

## 2015-06-02 NOTE — Patient Instructions (Addendum)
Your physician recommends that you return for lab work today: Blood cultures x 2  Your physician recommends that you schedule a follow-up appointment with the [facility] device clinic on 06/17/2015 @ 11:00am.  Dr. Royann Shivers recommends that you schedule a follow-up appointment in: 3 months

## 2015-06-03 ENCOUNTER — Telehealth: Payer: Self-pay | Admitting: *Deleted

## 2015-06-03 DIAGNOSIS — I130 Hypertensive heart and chronic kidney disease with heart failure and stage 1 through stage 4 chronic kidney disease, or unspecified chronic kidney disease: Secondary | ICD-10-CM | POA: Diagnosis not present

## 2015-06-03 DIAGNOSIS — I2510 Atherosclerotic heart disease of native coronary artery without angina pectoris: Secondary | ICD-10-CM | POA: Diagnosis not present

## 2015-06-03 DIAGNOSIS — F0390 Unspecified dementia without behavioral disturbance: Secondary | ICD-10-CM | POA: Diagnosis not present

## 2015-06-03 DIAGNOSIS — Z48812 Encounter for surgical aftercare following surgery on the circulatory system: Secondary | ICD-10-CM | POA: Diagnosis not present

## 2015-06-03 DIAGNOSIS — I509 Heart failure, unspecified: Secondary | ICD-10-CM | POA: Diagnosis not present

## 2015-06-03 NOTE — Telephone Encounter (Signed)
-----   Message from Thurmon Fair, MD sent at 06/03/2015 11:25 AM EST ----- Please call her and ask her to stop taking Brilinta, since her swollen pacemaker pocket may be due to bleeding. We will plan to stop this temporarily, maybe for a couple of weeks.

## 2015-06-03 NOTE — Telephone Encounter (Signed)
Patient and caregiver notified to [facility] Brilinta for now due to the swollen pacemaker pocket, since this may be due to bleeding.  Voiced understanding.  Will contact once we get the blood cultures back.

## 2015-06-07 LAB — CULTURE, BLOOD (ROUTINE X 2)
CULTURE: NO GROWTH
CULTURE: NO GROWTH

## 2015-06-09 LAB — CUP PACEART INCLINIC DEVICE CHECK
Battery Remaining Longevity: 99 mo
Brady Statistic AP VP Percent: 69 %
Brady Statistic AP VS Percent: 0 %
Brady Statistic AS VP Percent: 31 %
Date Time Interrogation Session: 20161122173541
Implantable Lead Implant Date: 20161011
Implantable Lead Location: 753860
Lead Channel Impedance Value: 560 Ohm
Lead Channel Pacing Threshold Amplitude: 0.5 V
Lead Channel Pacing Threshold Amplitude: 0.5 V
Lead Channel Pacing Threshold Amplitude: 0.5 V
Lead Channel Pacing Threshold Pulse Width: 0.4 ms
Lead Channel Pacing Threshold Pulse Width: 0.4 ms
Lead Channel Setting Pacing Amplitude: 2 V
Lead Channel Setting Pacing Amplitude: 2.5 V
Lead Channel Setting Pacing Pulse Width: 0.4 ms
Lead Channel Setting Sensing Sensitivity: 4 mV
MDC IDC LEAD IMPLANT DT: 20161011
MDC IDC LEAD LOCATION: 753859
MDC IDC MSMT BATTERY IMPEDANCE: 105 Ohm
MDC IDC MSMT BATTERY VOLTAGE: 2.78 V
MDC IDC MSMT LEADCHNL RA IMPEDANCE VALUE: 566 Ohm
MDC IDC MSMT LEADCHNL RA PACING THRESHOLD AMPLITUDE: 0.5 V
MDC IDC MSMT LEADCHNL RA PACING THRESHOLD PULSEWIDTH: 0.4 ms
MDC IDC MSMT LEADCHNL RA SENSING INTR AMPL: 4 mV
MDC IDC MSMT LEADCHNL RV PACING THRESHOLD PULSEWIDTH: 0.4 ms
MDC IDC STAT BRADY AS VS PERCENT: 0 %

## 2015-06-10 ENCOUNTER — Telehealth: Payer: Self-pay | Admitting: *Deleted

## 2015-06-10 NOTE — Telephone Encounter (Signed)
-----   Message from Marykay Lex, MD sent at 06/08/2015  2:10 PM EST ----- Good news. Blood cultures are negative -- this simply means that infection is not spread into the bloodstream. [facility]

## 2015-06-10 NOTE — Telephone Encounter (Signed)
SPOKE TO SON  Result given . Verbalized understanding

## 2015-06-11 ENCOUNTER — Telehealth: Payer: Self-pay | Admitting: Cardiology

## 2015-06-11 DIAGNOSIS — I130 Hypertensive heart and chronic kidney disease with heart failure and stage 1 through stage 4 chronic kidney disease, or unspecified chronic kidney disease: Secondary | ICD-10-CM | POA: Diagnosis not present

## 2015-06-11 DIAGNOSIS — I2510 Atherosclerotic heart disease of native coronary artery without angina pectoris: Secondary | ICD-10-CM | POA: Diagnosis not present

## 2015-06-11 DIAGNOSIS — Z48812 Encounter for surgical aftercare following surgery on the circulatory system: Secondary | ICD-10-CM | POA: Diagnosis not present

## 2015-06-11 DIAGNOSIS — F0390 Unspecified dementia without behavioral disturbance: Secondary | ICD-10-CM | POA: Diagnosis not present

## 2015-06-11 NOTE — Telephone Encounter (Signed)
[name] is calling in wanting to speak a nurse about her  Brilinta medication.   Thanks

## 2015-06-11 NOTE — Telephone Encounter (Signed)
Don O.T.working with patient  Wanted to know Marden Noble was discontinued. RN reviewed last office visit 06/02/15 - Brilinta was stopped by Dr Royann Shivers. Information was given to [facility].

## 2015-06-12 DIAGNOSIS — I130 Hypertensive heart and chronic kidney disease with heart failure and stage 1 through stage 4 chronic kidney disease, or unspecified chronic kidney disease: Secondary | ICD-10-CM | POA: Diagnosis not present

## 2015-06-12 DIAGNOSIS — Z48812 Encounter for surgical aftercare following surgery on the circulatory system: Secondary | ICD-10-CM | POA: Diagnosis not present

## 2015-06-12 DIAGNOSIS — F0390 Unspecified dementia without behavioral disturbance: Secondary | ICD-10-CM | POA: Diagnosis not present

## 2015-06-12 DIAGNOSIS — I2510 Atherosclerotic heart disease of native coronary artery without angina pectoris: Secondary | ICD-10-CM | POA: Diagnosis not present

## 2015-06-16 DIAGNOSIS — Z48812 Encounter for surgical aftercare following surgery on the circulatory system: Secondary | ICD-10-CM | POA: Diagnosis not present

## 2015-06-16 DIAGNOSIS — I2510 Atherosclerotic heart disease of native coronary artery without angina pectoris: Secondary | ICD-10-CM | POA: Diagnosis not present

## 2015-06-16 DIAGNOSIS — I130 Hypertensive heart and chronic kidney disease with heart failure and stage 1 through stage 4 chronic kidney disease, or unspecified chronic kidney disease: Secondary | ICD-10-CM | POA: Diagnosis not present

## 2015-06-16 DIAGNOSIS — F0390 Unspecified dementia without behavioral disturbance: Secondary | ICD-10-CM | POA: Diagnosis not present

## 2015-06-17 ENCOUNTER — Encounter: Payer: Self-pay | Admitting: Internal Medicine

## 2015-06-17 ENCOUNTER — Ambulatory Visit (INDEPENDENT_AMBULATORY_CARE_PROVIDER_SITE_OTHER): Payer: Self-pay | Admitting: *Deleted

## 2015-06-17 ENCOUNTER — Telehealth: Payer: Self-pay | Admitting: Internal Medicine

## 2015-06-17 DIAGNOSIS — Z48812 Encounter for surgical aftercare following surgery on the circulatory system: Secondary | ICD-10-CM | POA: Diagnosis not present

## 2015-06-17 DIAGNOSIS — I130 Hypertensive heart and chronic kidney disease with heart failure and stage 1 through stage 4 chronic kidney disease, or unspecified chronic kidney disease: Secondary | ICD-10-CM | POA: Diagnosis not present

## 2015-06-17 DIAGNOSIS — I2510 Atherosclerotic heart disease of native coronary artery without angina pectoris: Secondary | ICD-10-CM | POA: Diagnosis not present

## 2015-06-17 DIAGNOSIS — F0390 Unspecified dementia without behavioral disturbance: Secondary | ICD-10-CM | POA: Diagnosis not present

## 2015-06-17 DIAGNOSIS — T827XXD Infection and inflammatory reaction due to other cardiac and vascular devices, implants and grafts, subsequent encounter: Secondary | ICD-10-CM

## 2015-06-17 NOTE — Progress Notes (Signed)
Device pocket recheck. Swelling has diminished per Shakila. Pt not having tenderness, fevers, or chills. Device pocket not discolored. Reviewed by Dr. Ladona Ridgel, pocket looks good. Device not interrogated.   ROV w/ Dr. Royann Shivers 08/20/15.

## 2015-06-17 NOTE — Telephone Encounter (Signed)
Discussed with Dr Ladona Ridgel.  Okay to resume the Brilinta.  Daughter aware

## 2015-06-17 NOTE — Telephone Encounter (Signed)
New Message  Pt daughter calling to speak w/ rN- stated that she hasnot been on Brilinta for a couple weeks and she wants to know if she needs to continue not taking it or restarting it. Please call back and discuss.

## 2015-06-24 ENCOUNTER — Emergency Department (HOSPITAL_COMMUNITY): Payer: Medicare Other

## 2015-06-24 ENCOUNTER — Inpatient Hospital Stay (HOSPITAL_COMMUNITY)
Admission: EM | Admit: 2015-06-24 | Discharge: 2015-06-26 | DRG: 291 | Disposition: A | Discharge status: Home / Self Care | Payer: Medicare Other | Attending: Internal Medicine | Admitting: Internal Medicine

## 2015-06-24 ENCOUNTER — Encounter (HOSPITAL_COMMUNITY): Payer: Self-pay

## 2015-06-24 DIAGNOSIS — J690 Pneumonitis due to inhalation of food and vomit: Secondary | ICD-10-CM | POA: Diagnosis present

## 2015-06-24 DIAGNOSIS — K219 Gastro-esophageal reflux disease without esophagitis: Secondary | ICD-10-CM | POA: Diagnosis present

## 2015-06-24 DIAGNOSIS — R0602 Shortness of breath: Secondary | ICD-10-CM | POA: Diagnosis not present

## 2015-06-24 DIAGNOSIS — Z79899 Other long term (current) drug therapy: Secondary | ICD-10-CM | POA: Diagnosis not present

## 2015-06-24 DIAGNOSIS — I255 Ischemic cardiomyopathy: Secondary | ICD-10-CM | POA: Diagnosis present

## 2015-06-24 DIAGNOSIS — E039 Hypothyroidism, unspecified: Secondary | ICD-10-CM | POA: Diagnosis present

## 2015-06-24 DIAGNOSIS — E785 Hyperlipidemia, unspecified: Secondary | ICD-10-CM | POA: Diagnosis not present

## 2015-06-24 DIAGNOSIS — I5021 Acute systolic (congestive) heart failure: Secondary | ICD-10-CM | POA: Diagnosis not present

## 2015-06-24 DIAGNOSIS — E1129 Type 2 diabetes mellitus with other diabetic kidney complication: Secondary | ICD-10-CM | POA: Diagnosis present

## 2015-06-24 DIAGNOSIS — I5033 Acute on chronic diastolic (congestive) heart failure: Secondary | ICD-10-CM | POA: Diagnosis not present

## 2015-06-24 DIAGNOSIS — I252 Old myocardial infarction: Secondary | ICD-10-CM

## 2015-06-24 DIAGNOSIS — N183 Chronic kidney disease, stage 3 (moderate): Secondary | ICD-10-CM | POA: Diagnosis present

## 2015-06-24 DIAGNOSIS — I130 Hypertensive heart and chronic kidney disease with heart failure and stage 1 through stage 4 chronic kidney disease, or unspecified chronic kidney disease: Principal | ICD-10-CM | POA: Diagnosis present

## 2015-06-24 DIAGNOSIS — I2510 Atherosclerotic heart disease of native coronary artery without angina pectoris: Secondary | ICD-10-CM | POA: Diagnosis not present

## 2015-06-24 DIAGNOSIS — M1990 Unspecified osteoarthritis, unspecified site: Secondary | ICD-10-CM | POA: Diagnosis present

## 2015-06-24 DIAGNOSIS — F419 Anxiety disorder, unspecified: Secondary | ICD-10-CM | POA: Diagnosis not present

## 2015-06-24 DIAGNOSIS — Z48812 Encounter for surgical aftercare following surgery on the circulatory system: Secondary | ICD-10-CM | POA: Diagnosis not present

## 2015-06-24 DIAGNOSIS — I5031 Acute diastolic (congestive) heart failure: Secondary | ICD-10-CM | POA: Diagnosis not present

## 2015-06-24 DIAGNOSIS — Z96643 Presence of artificial hip joint, bilateral: Secondary | ICD-10-CM | POA: Diagnosis present

## 2015-06-24 DIAGNOSIS — I25810 Atherosclerosis of coronary artery bypass graft(s) without angina pectoris: Secondary | ICD-10-CM | POA: Diagnosis present

## 2015-06-24 DIAGNOSIS — Z955 Presence of coronary angioplasty implant and graft: Secondary | ICD-10-CM

## 2015-06-24 DIAGNOSIS — E1122 Type 2 diabetes mellitus with diabetic chronic kidney disease: Secondary | ICD-10-CM | POA: Diagnosis not present

## 2015-06-24 DIAGNOSIS — I110 Hypertensive heart disease with heart failure: Secondary | ICD-10-CM | POA: Diagnosis not present

## 2015-06-24 DIAGNOSIS — I509 Heart failure, unspecified: Secondary | ICD-10-CM | POA: Diagnosis not present

## 2015-06-24 DIAGNOSIS — F0390 Unspecified dementia without behavioral disturbance: Secondary | ICD-10-CM | POA: Diagnosis not present

## 2015-06-24 DIAGNOSIS — M109 Gout, unspecified: Secondary | ICD-10-CM | POA: Diagnosis not present

## 2015-06-24 DIAGNOSIS — I25709 Atherosclerosis of coronary artery bypass graft(s), unspecified, with unspecified angina pectoris: Secondary | ICD-10-CM | POA: Diagnosis present

## 2015-06-24 HISTORY — DX: Heart failure, unspecified: I50.9

## 2015-06-24 LAB — BASIC METABOLIC PANEL
Anion gap: 8 (ref 5–15)
BUN: 23 mg/dL — ABNORMAL HIGH (ref 6–20)
CHLORIDE: 118 mmol/L — AB (ref 101–111)
CO2: 23 mmol/L (ref 22–32)
Calcium: 9.2 mg/dL (ref 8.9–10.3)
Creatinine, Ser: 1.45 mg/dL — ABNORMAL HIGH (ref 0.44–1.00)
GFR calc non Af Amer: 30 mL/min — ABNORMAL LOW (ref >60)
GFR, EST AFRICAN AMERICAN: 35 mL/min — AB (ref >60)
Glucose, Bld: 110 mg/dL — ABNORMAL HIGH (ref 65–99)
POTASSIUM: 4.3 mmol/L (ref 3.5–5.1)
SODIUM: 149 mmol/L — AB (ref 135–145)

## 2015-06-24 LAB — CBC
HCT: 30.5 % — ABNORMAL LOW (ref 36.0–46.0)
Hemoglobin: 9.1 g/dL — ABNORMAL LOW (ref 12.0–15.0)
MCH: 24.9 pg — ABNORMAL LOW (ref 26.0–34.0)
MCHC: 29.8 g/dL — ABNORMAL LOW (ref 30.0–36.0)
MCV: 83.6 fL (ref 78.0–100.0)
PLATELETS: 168 10*3/uL (ref 150–400)
RBC: 3.65 MIL/uL — AB (ref 3.87–5.11)
RDW: 23.3 % — ABNORMAL HIGH (ref 11.5–15.5)
WBC: 3.9 10*3/uL — AB (ref 4.0–10.5)

## 2015-06-24 LAB — I-STAT TROPONIN, ED: Troponin i, poc: 0.02 ng/mL (ref 0.00–0.08)

## 2015-06-24 LAB — BRAIN NATRIURETIC PEPTIDE: B NATRIURETIC PEPTIDE 5: 841.3 pg/mL — AB (ref 0.0–100.0)

## 2015-06-24 MED ORDER — COLESEVELAM HCL 625 MG PO TABS
1875.0000 mg | ORAL_TABLET | Freq: Two times a day (BID) | ORAL | Status: DC
  Administered 2015-06-25 – 2015-06-26 (×3): 1875 mg via ORAL
  Filled 2015-06-24 (×3): qty 3

## 2015-06-24 MED ORDER — ASPIRIN EC 81 MG PO TBEC
81.0000 mg | DELAYED_RELEASE_TABLET | Freq: Every day | ORAL | Status: DC
  Administered 2015-06-25 – 2015-06-26 (×3): 81 mg via ORAL
  Filled 2015-06-24 (×3): qty 1

## 2015-06-24 MED ORDER — FUROSEMIDE 10 MG/ML IJ SOLN
40.0000 mg | Freq: Once | INTRAMUSCULAR | Status: AC
  Administered 2015-06-24: 40 mg via INTRAVENOUS
  Filled 2015-06-24: qty 4

## 2015-06-24 MED ORDER — CARVEDILOL 6.25 MG PO TABS
6.2500 mg | ORAL_TABLET | Freq: Two times a day (BID) | ORAL | Status: DC
  Administered 2015-06-25 – 2015-06-26 (×3): 6.25 mg via ORAL
  Filled 2015-06-24 (×3): qty 1

## 2015-06-24 MED ORDER — PANTOPRAZOLE SODIUM 40 MG PO TBEC
40.0000 mg | DELAYED_RELEASE_TABLET | Freq: Every day | ORAL | Status: DC
  Administered 2015-06-25 – 2015-06-26 (×2): 40 mg via ORAL
  Filled 2015-06-24 (×2): qty 1

## 2015-06-24 MED ORDER — SODIUM CHLORIDE 0.9 % IJ SOLN: 3.0000 mL | INTRAMUSCULAR | Status: DC | PRN

## 2015-06-24 MED ORDER — SODIUM BICARBONATE 650 MG PO TABS
650.0000 mg | ORAL_TABLET | Freq: Every day | ORAL | Status: DC
  Administered 2015-06-25 – 2015-06-26 (×2): 650 mg via ORAL
  Filled 2015-06-24 (×2): qty 1

## 2015-06-24 MED ORDER — LEVOTHYROXINE SODIUM 100 MCG PO TABS
100.0000 ug | ORAL_TABLET | Freq: Every day | ORAL | Status: DC
  Administered 2015-06-25 – 2015-06-26 (×2): 100 ug via ORAL
  Filled 2015-06-24 (×2): qty 1

## 2015-06-24 MED ORDER — LISINOPRIL 2.5 MG PO TABS
2.5000 mg | ORAL_TABLET | Freq: Every day | ORAL | Status: DC
  Administered 2015-06-25 – 2015-06-26 (×3): 2.5 mg via ORAL
  Filled 2015-06-24 (×4): qty 1

## 2015-06-24 MED ORDER — ALLOPURINOL 100 MG PO TABS
100.0000 mg | ORAL_TABLET | Freq: Every day | ORAL | Status: DC
  Administered 2015-06-25 – 2015-06-26 (×2): 100 mg via ORAL
  Filled 2015-06-24 (×2): qty 1

## 2015-06-24 MED ORDER — POLYVINYL ALCOHOL 1.4 % OP SOLN
1.0000 [drp] | Freq: Three times a day (TID) | OPHTHALMIC | Status: DC
  Administered 2015-06-24 – 2015-06-26 (×5): 1 [drp] via OPHTHALMIC
  Filled 2015-06-24: qty 15

## 2015-06-24 MED ORDER — TICAGRELOR 90 MG PO TABS
90.0000 mg | ORAL_TABLET | Freq: Two times a day (BID) | ORAL | Status: DC
  Administered 2015-06-25 – 2015-06-26 (×4): 90 mg via ORAL
  Filled 2015-06-24 (×4): qty 1

## 2015-06-24 MED ORDER — SODIUM CHLORIDE 0.9 % IV SOLN: 250.0000 mL | INTRAVENOUS | Status: DC | PRN

## 2015-06-24 MED ORDER — SODIUM CHLORIDE 0.9 % IJ SOLN
3.0000 mL | Freq: Two times a day (BID) | INTRAMUSCULAR | Status: DC
  Administered 2015-06-25 – 2015-06-26 (×2): 3 mL via INTRAVENOUS

## 2015-06-24 MED ORDER — ACETAMINOPHEN 325 MG PO TABS
650.0000 mg | ORAL_TABLET | Freq: Two times a day (BID) | ORAL | Status: DC
  Administered 2015-06-25 – 2015-06-26 (×4): 650 mg via ORAL
  Filled 2015-06-24 (×4): qty 2

## 2015-06-24 MED ORDER — FUROSEMIDE 40 MG PO TABS: 40.0000 mg | ORAL_TABLET | Freq: Every day | ORAL | Status: DC

## 2015-06-24 MED ORDER — FUROSEMIDE 10 MG/ML IJ SOLN
40.0000 mg | Freq: Two times a day (BID) | INTRAMUSCULAR | Status: DC
  Administered 2015-06-25 – 2015-06-26 (×3): 40 mg via INTRAVENOUS
  Filled 2015-06-24 (×3): qty 4

## 2015-06-24 MED ORDER — LORAZEPAM 0.5 MG PO TABS
0.5000 mg | ORAL_TABLET | Freq: Three times a day (TID) | ORAL | Status: DC
Start: 2015-06-24 — End: 2015-06-26
  Administered 2015-06-25 – 2015-06-26 (×5): 0.5 mg via ORAL
  Filled 2015-06-24 (×5): qty 1

## 2015-06-24 MED ORDER — ENOXAPARIN SODIUM 30 MG/0.3ML ~~LOC~~ SOLN
30.0000 mg | Freq: Every day | SUBCUTANEOUS | Status: DC
  Administered 2015-06-25 – 2015-06-26 (×2): 30 mg via SUBCUTANEOUS
  Filled 2015-06-24 (×2): qty 0.3

## 2015-06-24 MED ORDER — SODIUM CHLORIDE 0.9 % IJ SOLN
3.0000 mL | Freq: Two times a day (BID) | INTRAMUSCULAR | Status: DC
Start: 2015-06-24 — End: 2015-06-26

## 2015-06-24 MED ORDER — POLYETHYLENE GLYCOL 3350 17 G PO PACK: 17.0000 g | PACK | Freq: Every day | ORAL | Status: DC | PRN

## 2015-06-24 NOTE — Progress Notes (Signed)
Patient arrived on [address] with no c/o pain, VSS, CCMD notified of patients arrival and placed on box 2w06; AV paced HR in the 60's. Bed alarm is on, patient instructed on how to call for assistance.  Nikki Dom, RN

## 2015-06-24 NOTE — ED Provider Notes (Signed)
CSN: [id number]     Arrival date & time 06/24/15  1823 History   First MD Initiated Contact with Patient 06/24/15 1850     Chief Complaint  Patient presents with  . Shortness of Breath     Patient is a 81 y.o. female presenting with shortness of breath. The history is provided by the patient and a relative.  Shortness of Breath Severity:  Moderate Onset quality:  Gradual Duration: worse over past 2-3 days. Timing:  Intermittent Progression:  Worsening Chronicity:  New Relieved by:  Rest Worsened by:  Activity Associated symptoms: no chest pain, no cough, no fever, no syncope and no vomiting   Patient reports increasing SOB and LE edema over past several days No CP No fever No significant cough    Past Medical History  Diagnosis Date  . ST elevation myocardial infarction (STEMI) of inferior wall (HCC) 05/2011; 03/10/2012    a) 11/'12: 100% RCA, dLM 80% -- POBA of RCA (for planned CABG, not done until re-admission with NSTEMI 1/'13);; b) 8/'13: 100% SVG-RCA (PCI & re-PCI), native RCA 100%; Patent LIMA-p-mLAD, SVG-OM  . CAD S/P percutaneous coronary angioplasty 11/'12; 8/31 & 9/1/'13    a) MV: 100% RCA-POBA, dLM left main 80%; b) 1/'13: NSTEMI --> CABG; c) 2013: 8/31 - 100% RCA & acute SVG-RCA, 100% SVG-OM, 90% LM, 80% p&mLAD, ~70% RI, 50% Cx --> PCI-SVG-RCA: Promus Element DES x 3 (prox 2.5 mm x 38 mm & 2.5 mm x 16 mm, distal 2.5 mm x 16 mm); on 9/1 - accute in-stent Thrombosis - Aspiration thrombectomy & PTCA  . S/P CABG x 4 08/05/11    Dr. Tyrone Sage: LIMA-p-mLAD, SVG-RPDA, SVG-OM; known  100% SVG-OM, Extensive PTCA of SVG-RCA;;  Hospital course complicated by Afib & PNA; after d/c cellulitis of SVG harvest site due to edema  . Episodic atrial fibrillation (HCC) 07/2011    Post-Op CABG  . Ischemic cardiomyopathy 03/2012    Echo: EF 45-50% - Mild basal- mid inferolateral Hypokinesis: Gr 1 DD, mildly increased PAP.  Marland Kitchen Dyslipidemia, goal LDL below 70     statin intolerance (lipitor,  crestor drug reaction); Welchol & Zetia  . Hypertension, essential   . DM (diabetes mellitus), type 2 with renal complications (HCC)   . Chronic kidney disease (CKD) stage G3b/A1, moderately decreased glomerular filtration rate (GFR) between 30-44 mL/min/1.73 square meter and albuminuria creatinine ratio less than 30 mg/g   . History of pneumonia     post-op from CABG  . Hypothyroidism     on synthroid  . Headache(784.0)   . Osteoarthritis   . Anxiety     PRN Xanax   . Dermatophytosis of nail   . Gout     on Allopurinol  . Lower extremity edema     chronic  . GERD (gastroesophageal reflux disease) 04/26/2015  . Acute CHF (congestive heart failure) (HCC) 06/24/2015   Past Surgical History  Procedure Laterality Date  . Cardiac catheterization  November 2012; August and September 2013    Patent LIMA-LAD, patent she had an OM, patent stented SVG-RCA; occluded native RCA, 90% ostial left main, tandem 80% proximal and mid LAD, 70% mid Circumflex  . Coronary angioplasty  November 2012    PTCA only of RCA and setting of inferior STEMI  . Coronary artery bypass graft  08/05/2011    Procedure: CORONARY ARTERY BYPASS GRAFTING (CABG);  Surgeon: [name], MD;  Location: [facility] OR;  Service: Open Heart Surgery;  Laterality: N/A;  coronary artery  bypass graft times 4 using left internal mammary artery and right leg saphenous vein harvested endoscopically  . Joint replacement      both hips replaced  . I&d extremity  09/21/2011    Procedure: IRRIGATION AND DEBRIDEMENT EXTREMITY;  Surgeon: [name], MD;  Location: [facility] OR;  Service: Vascular;  Laterality: Right;  with wound vac placement  . Coronary angioplasty with stent placement  03/10/2012     infferior STEMI: Occluded native RCA and SVG-RCA --> thrombectomy and 3 stent placement to the SVG-RCA (2.5 mm right 38 mm and 2 proximal distal overlapping 2.5 mm x 16 mm Promus DES stents:  . Coronary angioplasty  03/11/2012    Inferior STEMI  #3: Reoccluded SVG-RCA; extensive thrombectomy and post dilation PTCA  . Transthoracic echocardiogram  September 2013    EF 45-50%, mild hypokinesis of the basal and mid inferolateral wall, grade 1 diastolic dysfunction, mildly increased artery pressures.  . Left heart catheterization with coronary angiogram N/A 05/26/2011    Procedure: LEFT HEART CATHETERIZATION WITH CORONARY ANGIOGRAM;  Surgeon: Marykay Lex, MD;  Location: [facility] CATH LAB;  Service: Cardiovascular;  Laterality: N/A;  . Percutaneous coronary intervention-balloon only N/A 05/26/2011    Procedure: PERCUTANEOUS CORONARY INTERVENTION-BALLOON ONLY;  Surgeon: Marykay Lex, MD;  Location: [facility] CATH LAB;  Service: Cardiovascular;  Laterality: N/A;  . Left heart cath N/A 03/10/2012    Procedure: LEFT HEART CATH;  Surgeon: Herby Abraham, MD;  Location: [facility] CATH LAB;  Service: Cardiovascular;  Laterality: N/A;  . Percutaneous coronary stent intervention (pci-s)  03/10/2012    Procedure: PERCUTANEOUS CORONARY STENT INTERVENTION (PCI-S);  Surgeon: Herby Abraham, MD;  Location: [facility] CATH LAB;  Service: Cardiovascular;;  . Left heart catheterization with coronary angiogram Bilateral 03/11/2012    Procedure: LEFT HEART CATHETERIZATION WITH CORONARY ANGIOGRAM;  Surgeon: Thurmon Fair, MD;  Location: [facility] CATH LAB;  Service: Cardiovascular;  Laterality: Bilateral;  . Ep implantable device N/A 02/24/2015    Procedure: Pacemaker Implant;  Surgeon: Thurmon Fair, MD;  Location: [facility] INVASIVE CV LAB;  Service: Cardiovascular;  Laterality: N/A;  . Ep implantable device N/A 04/17/2015    Procedure: Lead Extraction, Can Extraction;  Surgeon: Marinus Maw, MD;  Location: [facility] INVASIVE CV LAB;  Service: Cardiovascular;  Laterality: N/A;  . Cardiac catheterization N/A 04/17/2015    Procedure: Temporary Pacemaker;  Surgeon: Marinus Maw, MD;  Location: [facility] INVASIVE CV LAB;  Service: Cardiovascular;  Laterality: N/A;  . Ep implantable device N/A 04/21/2015     Procedure: Pacemaker Implant;  Surgeon: Marinus Maw, MD;  Location: [facility] INVASIVE CV LAB;  Service: Cardiovascular;  Laterality: N/A;  . Pacemaker lead removal  04/21/2015    Procedure: Pacemaker Lead Removal;  Surgeon: Marinus Maw, MD;  Location: [facility] INVASIVE CV LAB;  Service: Cardiovascular;;  LV    Family History  Problem Relation Age of Onset  . Breast cancer Mother   . Heart disease Father    Social History  Substance Use Topics  . Smoking status: Never Smoker   . Smokeless tobacco: Never Used  . Alcohol Use: 0.6 oz/week    1 Glasses of wine per week   OB History    No data available     Review of Systems  Constitutional: Negative for fever.  Respiratory: Positive for shortness of breath. Negative for cough.   Cardiovascular: Negative for chest pain and syncope.  Gastrointestinal: Negative for vomiting.  Neurological: Negative for syncope.  All other systems reviewed  and are negative.     Allergies  Statins and Tape  Home Medications   Prior to Admission medications   Medication Sig Start Date End Date Taking? Authorizing Provider  acetaminophen (TYLENOL) 325 MG tablet Take 650 mg by mouth 2 (two) times daily.   Yes Historical Provider, MD  allopurinol (ZYLOPRIM) 100 MG tablet Take 100 mg by mouth daily.    Yes Historical Provider, MD  carvedilol (COREG) 6.25 MG tablet Take 1 tablet (6.25 mg total) by mouth 2 (two) times daily with a meal. 08/15/14  Yes Marykay Lex, MD  colesevelam [facility]) 625 MG tablet Take 1,875 mg by mouth See admin instructions. Take 3 tablets (1875 mg) with breakfast and with lunch   Yes Historical Provider, MD  furosemide (LASIX) 40 MG tablet Take 40 mg by mouth daily.    Yes Historical Provider, MD  levothyroxine (SYNTHROID, LEVOTHROID) 100 MCG tablet Take 100 mcg by mouth daily.    Yes Historical Provider, MD  LORazepam (ATIVAN) 0.5 MG tablet Take 0.5 mg by mouth 3 (three) times daily.  01/16/13  Yes Historical Provider, MD  pantoprazole  (PROTONIX) 40 MG tablet Take 40 mg by mouth daily. 09/24/14  Yes Historical Provider, MD  polyethylene glycol (MIRALAX / GLYCOLAX) packet Take 17 g by mouth daily as needed for moderate constipation.   Yes Historical Provider, MD  polyvinyl alcohol (LIQUIFILM TEARS) 1.4 % ophthalmic solution Place 1 drop into both eyes 3 (three) times daily.   Yes Historical Provider, MD  sodium bicarbonate 650 MG tablet Take 650 mg by mouth daily.     Yes Historical Provider, MD  ticagrelor (BRILINTA) 90 MG TABS tablet TAKE 1 TABLET (90 MG TOTAL) BY MOUTH 2 (TWO) TIMES DAILY. Resume on 03/02/15 02/25/15  Yes Amber Caryl Bis, NP   BP 136/68 mmHg  Pulse 60  Temp(Src) 98 F (36.7 C) (Oral)  Resp 19  Wt 64.156 kg  SpO2 99% Physical Exam CONSTITUTIONAL: Well developed/well nourished, appears younger than stated age HEAD: Normocephalic/atraumatic EYES: EOMI ENMT: Mucous membranes moist NECK: supple no meningeal signs SPINE/BACK:entire spine nontender CV: S1/S2 noted, no murmurs/rubs/gallops noted LUNGS: brief crackles in bases, no apparent distress ABDOMEN: soft, nontender GU:no cva tenderness NEURO: Pt is awake/alert/appropriate, moves all extremitiesx4.  No facial droop.   EXTREMITIES: pulses normal/equal, full ROM, symmetric pitting edema in bilateral LE SKIN: warm, color normal PSYCH: flat affect  ED Course  Procedures  9:03 PM Pt with h/o CAD and previous CHF Here with increased LE edema and SOB She has had these symptoms before but appear worse in past several days She also underwent a lot of stress due to death of husband this month Will admit for CHF exacerbation and diuresis Pt agreeable D/w dr Robb Matar, will admit and place on OBS  Labs Review Labs Reviewed  BASIC METABOLIC PANEL - Abnormal; Notable for the following:    Sodium 149 (*)    Chloride 118 (*)    Glucose, Bld 110 (*)    BUN 23 (*)    Creatinine, Ser 1.45 (*)    GFR calc non Af Amer 30 (*)    GFR calc Af Amer 35 (*)    All  other components within normal limits  CBC - Abnormal; Notable for the following:    WBC 3.9 (*)    RBC 3.65 (*)    Hemoglobin 9.1 (*)    HCT 30.5 (*)    MCH 24.9 (*)    MCHC 29.8 (*)  RDW 23.3 (*)    All other components within normal limits  BRAIN NATRIURETIC PEPTIDE - Abnormal; Notable for the following:    B Natriuretic Peptide 841.3 (*)    All other components within normal limits  I-STAT TROPOININ, ED    Imaging Review Dg Chest 2 View  06/24/2015  CLINICAL DATA:  Shortness of breath for 1 day. EXAM: CHEST  2 VIEW COMPARISON:  04/22/2015 FINDINGS: With postoperative changes in the mediastinum. Cardiac pacemaker. Shallow inspiration. Cardiac enlargement with mild pulmonary vascular congestion. Airspace infiltration present in the right lung base could represent pneumonia or localized edema. Small bilateral pleural effusions. No pneumothorax. Degenerative changes in the right shoulder. Calcified and tortuous aorta. IMPRESSION: Cardiac enlargement with pulmonary vascular congestion and bilateral pleural effusions. Infiltration in the right lung base could indicate pneumonia or localized edema. Electronically Signed   By: Burman Nieves M.D.   On: 06/24/2015 18:52   I have personally reviewed and evaluated these images and lab results as part of my medical decision-making.   EKG Interpretation   Date/Time:  Wednesday June 24 2015 18:25:59 EST Ventricular Rate:  68 PR Interval:  191 QRS Duration: 201 QT Interval:  509 QTC Calculation: 541 R Axis:   -74 Text Interpretation:  Sinus rhythm Nonspecific IVCD with LAD LVH with  secondary repolarization abnormality No significant change since last  tracing Confirmed by Bebe Shaggy  MD, Dorinda Hill ([id number]) on 06/24/2015 6:37:33 PM     Medications  furosemide (LASIX) injection 40 mg (40 mg Intravenous Given 06/24/15 2007)    MDM   Final diagnoses:  Acute congestive heart failure, unspecified congestive heart failure type [facility])     Nursing notes including past medical history and social history reviewed and considered in documentation xrays/imaging reviewed by myself and considered during evaluation Labs/vital reviewed myself and considered during evaluation Previous records reviewed and considered     Zadie Rhine, MD 06/24/15 2105

## 2015-06-24 NOTE — ED Notes (Signed)
Report attempted, RN to call back. 

## 2015-06-24 NOTE — ED Notes (Signed)
Pt. BIB EMS for complaint of exertional SOB. Pt. Lives at home with family, uses cane and wheelchair to get around at home. No home O2. EMS states pt. Felt much better on 2L. Pt. Does have R sided pacer, rhythm of 66. Some increased edema to bilateral lower legs.

## 2015-06-24 NOTE — ED Notes (Signed)
hospitalist at the bedside 

## 2015-06-25 DIAGNOSIS — Z955 Presence of coronary angioplasty implant and graft: Secondary | ICD-10-CM | POA: Diagnosis not present

## 2015-06-25 DIAGNOSIS — I5031 Acute diastolic (congestive) heart failure: Secondary | ICD-10-CM | POA: Diagnosis not present

## 2015-06-25 DIAGNOSIS — M1990 Unspecified osteoarthritis, unspecified site: Secondary | ICD-10-CM | POA: Diagnosis present

## 2015-06-25 DIAGNOSIS — K219 Gastro-esophageal reflux disease without esophagitis: Secondary | ICD-10-CM | POA: Diagnosis present

## 2015-06-25 DIAGNOSIS — N183 Chronic kidney disease, stage 3 (moderate): Secondary | ICD-10-CM | POA: Diagnosis present

## 2015-06-25 DIAGNOSIS — I130 Hypertensive heart and chronic kidney disease with heart failure and stage 1 through stage 4 chronic kidney disease, or unspecified chronic kidney disease: Secondary | ICD-10-CM | POA: Diagnosis present

## 2015-06-25 DIAGNOSIS — F419 Anxiety disorder, unspecified: Secondary | ICD-10-CM | POA: Diagnosis present

## 2015-06-25 DIAGNOSIS — I252 Old myocardial infarction: Secondary | ICD-10-CM | POA: Diagnosis not present

## 2015-06-25 DIAGNOSIS — R0602 Shortness of breath: Secondary | ICD-10-CM | POA: Diagnosis present

## 2015-06-25 DIAGNOSIS — J690 Pneumonitis due to inhalation of food and vomit: Secondary | ICD-10-CM | POA: Diagnosis present

## 2015-06-25 DIAGNOSIS — M109 Gout, unspecified: Secondary | ICD-10-CM | POA: Diagnosis present

## 2015-06-25 DIAGNOSIS — Z96643 Presence of artificial hip joint, bilateral: Secondary | ICD-10-CM | POA: Diagnosis present

## 2015-06-25 DIAGNOSIS — I5033 Acute on chronic diastolic (congestive) heart failure: Secondary | ICD-10-CM | POA: Diagnosis present

## 2015-06-25 DIAGNOSIS — I25810 Atherosclerosis of coronary artery bypass graft(s) without angina pectoris: Secondary | ICD-10-CM | POA: Diagnosis present

## 2015-06-25 DIAGNOSIS — E785 Hyperlipidemia, unspecified: Secondary | ICD-10-CM | POA: Diagnosis present

## 2015-06-25 DIAGNOSIS — E039 Hypothyroidism, unspecified: Secondary | ICD-10-CM | POA: Diagnosis present

## 2015-06-25 DIAGNOSIS — E1122 Type 2 diabetes mellitus with diabetic chronic kidney disease: Secondary | ICD-10-CM | POA: Diagnosis present

## 2015-06-25 DIAGNOSIS — I5021 Acute systolic (congestive) heart failure: Secondary | ICD-10-CM | POA: Diagnosis not present

## 2015-06-25 DIAGNOSIS — I255 Ischemic cardiomyopathy: Secondary | ICD-10-CM | POA: Diagnosis present

## 2015-06-25 DIAGNOSIS — Z79899 Other long term (current) drug therapy: Secondary | ICD-10-CM | POA: Diagnosis not present

## 2015-06-25 DIAGNOSIS — I2510 Atherosclerotic heart disease of native coronary artery without angina pectoris: Secondary | ICD-10-CM | POA: Diagnosis present

## 2015-06-25 LAB — GLUCOSE, CAPILLARY
GLUCOSE-CAPILLARY: 83 mg/dL (ref 65–99)
GLUCOSE-CAPILLARY: 101 mg/dL — AB (ref 65–99)
Glucose-Capillary: 101 mg/dL — ABNORMAL HIGH (ref 65–99)

## 2015-06-25 LAB — COMPREHENSIVE METABOLIC PANEL
ALBUMIN: 2.5 g/dL — AB (ref 3.5–5.0)
ALK PHOS: 120 U/L (ref 38–126)
ALT: 12 U/L — ABNORMAL LOW (ref 14–54)
ANION GAP: 6 (ref 5–15)
AST: 16 U/L (ref 15–41)
BILIRUBIN TOTAL: 0.3 mg/dL (ref 0.3–1.2)
BUN: 22 mg/dL — AB (ref 6–20)
CALCIUM: 9.2 mg/dL (ref 8.9–10.3)
CO2: 26 mmol/L (ref 22–32)
Chloride: 116 mmol/L — ABNORMAL HIGH (ref 101–111)
Creatinine, Ser: 1.47 mg/dL — ABNORMAL HIGH (ref 0.44–1.00)
GFR calc Af Amer: 34 mL/min — ABNORMAL LOW (ref >60)
GFR calc non Af Amer: 30 mL/min — ABNORMAL LOW (ref >60)
GLUCOSE: 93 mg/dL (ref 65–99)
POTASSIUM: 4.1 mmol/L (ref 3.5–5.1)
SODIUM: 148 mmol/L — AB (ref 135–145)
TOTAL PROTEIN: 4.9 g/dL — AB (ref 6.5–8.1)

## 2015-06-25 LAB — APTT: APTT: 41 s — AB (ref 24–37)

## 2015-06-25 LAB — PROTIME-INR
INR: 1.22 (ref 0.00–1.49)
Prothrombin Time: 15.6 seconds — ABNORMAL HIGH (ref 11.6–15.2)

## 2015-06-25 LAB — MAGNESIUM: Magnesium: 1.9 mg/dL (ref 1.7–2.4)

## 2015-06-25 LAB — CBC
HEMATOCRIT: 28.5 % — AB (ref 36.0–46.0)
Hemoglobin: 8.5 g/dL — ABNORMAL LOW (ref 12.0–15.0)
MCH: 24.9 pg — ABNORMAL LOW (ref 26.0–34.0)
MCHC: 29.8 g/dL — AB (ref 30.0–36.0)
MCV: 83.3 fL (ref 78.0–100.0)
Platelets: 138 10*3/uL — ABNORMAL LOW (ref 150–400)
RBC: 3.42 MIL/uL — ABNORMAL LOW (ref 3.87–5.11)
RDW: 23.4 % — AB (ref 11.5–15.5)
WBC: 3.3 10*3/uL — ABNORMAL LOW (ref 4.0–10.5)

## 2015-06-25 LAB — PHOSPHORUS: PHOSPHORUS: 2.8 mg/dL (ref 2.5–4.6)

## 2015-06-25 LAB — TROPONIN I

## 2015-06-25 LAB — TSH: TSH: 4.481 u[IU]/mL (ref 0.350–4.500)

## 2015-06-25 MED ORDER — AMOXICILLIN-POT CLAVULANATE 875-125 MG PO TABS
1.0000 | ORAL_TABLET | Freq: Two times a day (BID) | ORAL | Status: DC
  Administered 2015-06-25: 1 via ORAL
  Filled 2015-06-25: qty 1

## 2015-06-25 MED ORDER — AMOXICILLIN-POT CLAVULANATE 500-125 MG PO TABS
1.0000 | ORAL_TABLET | Freq: Two times a day (BID) | ORAL | Status: DC
  Administered 2015-06-25 – 2015-06-26 (×2): 500 mg via ORAL
  Filled 2015-06-25 (×3): qty 1

## 2015-06-25 NOTE — H&P (Signed)
[facility] History and Physical  CALEYAH JR WUJ:[id number] DOB: [birth year] DOA: 06/24/2015  Referring physician: Zadie Rhine, MD PCP: Michiel Sites, MD   Social history is mainly provided by the patient's daughter.  Chief Complaint: Shortness of Breath  HPI: [name] is a 81 y.o. female with a past medical history of CAD, CABG, diastolic CHF, hyperlipidemia, hypertension, type 2 diabetes, chronic kidney disease, hypothyroidism, GERD who was brought into the emergency department by EMS after developing exertional shortness of breath shortly after eating dinner at around 1700 today. Her symptoms improved significantly after EMS gave her oxygen. She denies chest pain, palpitations, dizziness, diaphoresis, but has been having worsening of lower extremity pitting edema over the last few days. Also, the patient has been not feeling well and has been anxious after her husband passed away last week.  When seen, the patient was in no acute distress. She denies having shortness of breath while on supplemental oxygen.  Review of Systems:  Constitutional:  Positive fatigue.  No weight loss, night sweats, Fevers, chills, HEENT:  No headaches, Difficulty swallowing,Tooth/dental problems,Sore throat,  No sneezing, itching, ear ache, nasal congestion, post nasal drip,  Cardio-vascular:  As above mentioned. GI:  No heartburn, indigestion, abdominal pain, nausea, vomiting, diarrhea, change in bowel habits, loss of appetite  Resp:  Positive dyspnea. Denies productive cough, wheezing or hemoptysis. Skin:  no rash or lesions.  GU:  no dysuria, change in color of urine, no urgency or frequency. No flank pain.  Musculoskeletal:  Occasional arthralgias.  Psych:  Recent death of spouse. No change in mood or affect. No depression or anxiety. No memory loss.   Past Medical History  Diagnosis Date  . ST elevation myocardial infarction (STEMI) of inferior wall (HCC)  05/2011; 03/10/2012    a) 11/'12: 100% RCA, dLM 80% -- POBA of RCA (for planned CABG, not done until re-admission with NSTEMI 1/'13);; b) 8/'13: 100% SVG-RCA (PCI & re-PCI), native RCA 100%; Patent LIMA-p-mLAD, SVG-OM  . CAD S/P percutaneous coronary angioplasty 11/'12; 8/31 & 9/1/'13    a) MV: 100% RCA-POBA, dLM left main 80%; b) 1/'13: NSTEMI --> CABG; c) 2013: 8/31 - 100% RCA & acute SVG-RCA, 100% SVG-OM, 90% LM, 80% p&mLAD, ~70% RI, 50% Cx --> PCI-SVG-RCA: Promus Element DES x 3 (prox 2.5 mm x 38 mm & 2.5 mm x 16 mm, distal 2.5 mm x 16 mm); on 9/1 - accute in-stent Thrombosis - Aspiration thrombectomy & PTCA  . S/P CABG x 4 08/05/11    Dr. Tyrone Sage: LIMA-p-mLAD, SVG-RPDA, SVG-OM; known  100% SVG-OM, Extensive PTCA of SVG-RCA;;  Hospital course complicated by Afib & PNA; after d/c cellulitis of SVG harvest site due to edema  . Episodic atrial fibrillation (HCC) 07/2011    Post-Op CABG  . Ischemic cardiomyopathy 03/2012    Echo: EF 45-50% - Mild basal- mid inferolateral Hypokinesis: Gr 1 DD, mildly increased PAP.  Marland Kitchen Dyslipidemia, goal LDL below 70     statin intolerance (lipitor, crestor drug reaction); Welchol & Zetia  . Hypertension, essential   . DM (diabetes mellitus), type 2 with renal complications (HCC)   . Chronic kidney disease (CKD) stage G3b/A1, moderately decreased glomerular filtration rate (GFR) between 30-44 mL/min/1.73 square meter and albuminuria creatinine ratio less than 30 mg/g   . History of pneumonia     post-op from CABG  . Hypothyroidism     on synthroid  . Headache(784.0)   . Osteoarthritis   . Anxiety  PRN Xanax   . Dermatophytosis of nail   . Gout     on Allopurinol  . Lower extremity edema     chronic  . GERD (gastroesophageal reflux disease) 04/26/2015  . Acute CHF (congestive heart failure) (HCC) 06/24/2015   Past Surgical History  Procedure Laterality Date  . Cardiac catheterization  November 2012; August and September 2013    Patent LIMA-LAD, patent  she had an OM, patent stented SVG-RCA; occluded native RCA, 90% ostial left main, tandem 80% proximal and mid LAD, 70% mid Circumflex  . Coronary angioplasty  November 2012    PTCA only of RCA and setting of inferior STEMI  . Coronary artery bypass graft  08/05/2011    Procedure: CORONARY ARTERY BYPASS GRAFTING (CABG);  Surgeon: [name], MD;  Location: [facility] OR;  Service: Open Heart Surgery;  Laterality: N/A;  coronary artery bypass graft times 4 using left internal mammary artery and right leg saphenous vein harvested endoscopically  . Joint replacement      both hips replaced  . I&d extremity  09/21/2011    Procedure: IRRIGATION AND DEBRIDEMENT EXTREMITY;  Surgeon: [name], MD;  Location: [facility] OR;  Service: Vascular;  Laterality: Right;  with wound vac placement  . Coronary angioplasty with stent placement  03/10/2012     infferior STEMI: Occluded native RCA and SVG-RCA --> thrombectomy and 3 stent placement to the SVG-RCA (2.5 mm right 38 mm and 2 proximal distal overlapping 2.5 mm x 16 mm Promus DES stents:  . Coronary angioplasty  03/11/2012    Inferior STEMI #3: Reoccluded SVG-RCA; extensive thrombectomy and post dilation PTCA  . Transthoracic echocardiogram  September 2013    EF 45-50%, mild hypokinesis of the basal and mid inferolateral wall, grade 1 diastolic dysfunction, mildly increased artery pressures.  . Left heart catheterization with coronary angiogram N/A 05/26/2011    Procedure: LEFT HEART CATHETERIZATION WITH CORONARY ANGIOGRAM;  Surgeon: Marykay Lex, MD;  Location: [facility] CATH LAB;  Service: Cardiovascular;  Laterality: N/A;  . Percutaneous coronary intervention-balloon only N/A 05/26/2011    Procedure: PERCUTANEOUS CORONARY INTERVENTION-BALLOON ONLY;  Surgeon: Marykay Lex, MD;  Location: [facility] CATH LAB;  Service: Cardiovascular;  Laterality: N/A;  . Left heart cath N/A 03/10/2012    Procedure: LEFT HEART CATH;  Surgeon: Herby Abraham, MD;  Location: [facility] CATH  LAB;  Service: Cardiovascular;  Laterality: N/A;  . Percutaneous coronary stent intervention (pci-s)  03/10/2012    Procedure: PERCUTANEOUS CORONARY STENT INTERVENTION (PCI-S);  Surgeon: Herby Abraham, MD;  Location: [facility] CATH LAB;  Service: Cardiovascular;;  . Left heart catheterization with coronary angiogram Bilateral 03/11/2012    Procedure: LEFT HEART CATHETERIZATION WITH CORONARY ANGIOGRAM;  Surgeon: Thurmon Fair, MD;  Location: [facility] CATH LAB;  Service: Cardiovascular;  Laterality: Bilateral;  . Ep implantable device N/A 02/24/2015    Procedure: Pacemaker Implant;  Surgeon: Thurmon Fair, MD;  Location: [facility] INVASIVE CV LAB;  Service: Cardiovascular;  Laterality: N/A;  . Ep implantable device N/A 04/17/2015    Procedure: Lead Extraction, Can Extraction;  Surgeon: Marinus Maw, MD;  Location: [facility] INVASIVE CV LAB;  Service: Cardiovascular;  Laterality: N/A;  . Cardiac catheterization N/A 04/17/2015    Procedure: Temporary Pacemaker;  Surgeon: Marinus Maw, MD;  Location: [facility] INVASIVE CV LAB;  Service: Cardiovascular;  Laterality: N/A;  . Ep implantable device N/A 04/21/2015    Procedure: Pacemaker Implant;  Surgeon: Marinus Maw, MD;  Location: [facility] INVASIVE CV LAB;  Service:  Cardiovascular;  Laterality: N/A;  . Pacemaker lead removal  04/21/2015    Procedure: Pacemaker Lead Removal;  Surgeon: Marinus Maw, MD;  Location: [facility] INVASIVE CV LAB;  Service: Cardiovascular;;  LV    Social History:  reports that she has never smoked. She has never used smokeless tobacco. She reports that she drinks about 0.6 oz of alcohol per week. She reports that she does not use illicit drugs.  Allergies  Allergen Reactions  . Statins Rash  . Tape Rash    Please use paper tape    Family History  Problem Relation Age of Onset  . Breast cancer Mother   . Heart disease Father     Prior to Admission medications   Medication Sig Start Date End Date Taking? Authorizing Provider  acetaminophen (TYLENOL) 325 MG  tablet Take 650 mg by mouth 2 (two) times daily.   Yes Historical Provider, MD  allopurinol (ZYLOPRIM) 100 MG tablet Take 100 mg by mouth daily.    Yes Historical Provider, MD  carvedilol (COREG) 6.25 MG tablet Take 1 tablet (6.25 mg total) by mouth 2 (two) times daily with a meal. 08/15/14  Yes Marykay Lex, MD  colesevelam [facility]) 625 MG tablet Take 1,875 mg by mouth See admin instructions. Take 3 tablets (1875 mg) with breakfast and with lunch   Yes Historical Provider, MD  furosemide (LASIX) 40 MG tablet Take 40 mg by mouth daily.    Yes Historical Provider, MD  levothyroxine (SYNTHROID, LEVOTHROID) 100 MCG tablet Take 100 mcg by mouth daily.    Yes Historical Provider, MD  LORazepam (ATIVAN) 0.5 MG tablet Take 0.5 mg by mouth 3 (three) times daily.  01/16/13  Yes Historical Provider, MD  pantoprazole (PROTONIX) 40 MG tablet Take 40 mg by mouth daily. 09/24/14  Yes Historical Provider, MD  polyethylene glycol (MIRALAX / GLYCOLAX) packet Take 17 g by mouth daily as needed for moderate constipation.   Yes Historical Provider, MD  polyvinyl alcohol (LIQUIFILM TEARS) 1.4 % ophthalmic solution Place 1 drop into both eyes 3 (three) times daily.   Yes Historical Provider, MD  sodium bicarbonate 650 MG tablet Take 650 mg by mouth daily.     Yes Historical Provider, MD  ticagrelor (BRILINTA) 90 MG TABS tablet TAKE 1 TABLET (90 MG TOTAL) BY MOUTH 2 (TWO) TIMES DAILY. Resume on 03/02/15 02/25/15  Yes Amber Caryl Bis, NP   Physical Exam: Filed Vitals:   06/24/15 2100 06/24/15 2200 06/24/15 2229 06/25/15 0000  BP: 157/85 155/72 142/76   Pulse: 66 57 71   Temp:  98 F (36.7 C)  98.4 F (36.9 C)  TempSrc:  Oral  Oral  Resp: Weight:   64.1 kg (141 lb 5 oz)   SpO2: 97% 97% 100%     Wt Readings from Last 3 Encounters:  06/24/15 64.1 kg (141 lb 5 oz)  06/02/15 62.279 kg (137 lb 4.8 oz)  05/01/15 66.044 kg (145 lb 9.6 oz)    General:  Appears calm and comfortable Eyes: PERRL, normal lids,  irises & conjunctiva ENT: grossly normal hearing, lips & tongue Neck: no LAD, masses or thyromegaly Cardiovascular: RRR, positive systolic murmur 2/6, no rub +1-1/2 LE edema. Telemetry: SR, no arrhythmias  Respiratory: Bibasilar Rales. Normal respiratory effort. Abdomen: soft, ntnd Skin: no rash or induration seen on limited exam Musculoskeletal: grossly normal tone BUE/BLE Psychiatric: grossly normal mood and affect, speech fluent and appropriate Neurologic: grossly non-focal.  Labs on Admission:  Basic Metabolic Panel:  Recent Labs Lab 06/24/15 1908  NA 149*  K 4.3  CL 118*  CO2 23  GLUCOSE 110*  BUN 23*  CREATININE 1.45*  CALCIUM 9.2   CBC:  Recent Labs Lab 06/24/15 1908  WBC 3.9*  HGB 9.1*  HCT 30.5*  MCV 83.6  PLT 168    BNP (last 3 results)  Recent Labs  03/01/15 1849 06/24/15 1908  BNP 224.5* 841.3*     Radiological Exams on Admission: Dg Chest 2 View  06/24/2015  CLINICAL DATA:  Shortness of breath for 1 day. EXAM: CHEST  2 VIEW COMPARISON:  04/22/2015 FINDINGS: With postoperative changes in the mediastinum. Cardiac pacemaker. Shallow inspiration. Cardiac enlargement with mild pulmonary vascular congestion. Airspace infiltration present in the right lung base could represent pneumonia or localized edema. Small bilateral pleural effusions. No pneumothorax. Degenerative changes in the right shoulder. Calcified and tortuous aorta. IMPRESSION: Cardiac enlargement with pulmonary vascular congestion and bilateral pleural effusions. Infiltration in the right lung base could indicate pneumonia or localized edema. Electronically Signed   By: Burman Nieves M.D.   On: 06/24/2015 18:52   echocardiogram 02/24/2015.  ------------------------------------------------------------------- LV EF: 55% -  60%  ------------------------------------------------------------------- Indications:   2nd degree AV block [Mobitz 1]  426.13.  ------------------------------------------------------------------- History:  PMH: Symptomatic bradycardia. STEMI. Risk factors: Hypertension. Diabetes mellitus. Dyslipidemia.  ------------------------------------------------------------------- Study Conclusions  - Left ventricle: The cavity size was normal. There was moderate focal basal hypertrophy of the septum. Systolic function was normal. The estimated ejection fraction was in the range of 55% to 60%. Wall motion was normal; there were no regional wall motion abnormalities. Doppler parameters are consistent with abnormal left ventricular relaxation (grade 1 diastolic dysfunction). - Aortic valve: There was no stenosis. - Mitral valve: Mildly calcified annulus. There was mild regurgitation. - Left atrium: The atrium was moderately dilated. - Right ventricle: The cavity size was normal. Systolic function was mildly reduced. - Tricuspid valve: There was moderate regurgitation. Peak RV-RA gradient (S): 39 mm Hg. - Pulmonary arteries: PA peak pressure: 42 mm Hg (S). - Inferior vena cava: The vessel was normal in size. The respirophasic diameter changes were in the normal range (>= 50%), consistent with normal central venous pressure. - Pericardium, extracardiac: A trivial pericardial effusion was identified.  Impressions:  - The patient was bradycardic with 2:1 AV block. Normal LV size with moderate focal basal septal hypertrophy. EF 55-60%. Normal RV size with mildly decreased systolic function. Mild MR. Mild pulmonary hypertension.  EKG: Independently reviewed. Vent. rate 68 BPM PR interval 191 ms QRS duration 201 ms QT/QTc 509/541 ms P-R-T axes 50 -74 100 Sinus rhythm Nonspecific IVCD with LAD LVH with secondary repolarization abnormality  Assessment/Plan Principal Problem:   Acute CHF (congestive heart failure) (HCC) Admit to telemetry. Continue supplemental  oxygen. Trend troponin levels. Furosemide 40 mg IV twice a day. Monitor input and output.  Active Problems:   Dyslipidemia with statin intolerance Continue WelChol. Continue lifestyle modifications.    Hypothyroidism Continue levothyroxine supplementation. Monitor TSH periodically.    DM (diabetes mellitus), type 2 with renal complications (HCC) Continue Brillinta CBG monitoring while the patient is in the hospital.    CAD (coronary artery disease) Continue aspirin and beta blocker.    GERD (gastroesophageal reflux disease) Pantoprazole 40 mg by mouth daily     Code Status: Full code. DVT Prophylaxis: Lovenox SQ. Family Communication: Her daughter was by bedside Disposition Plan: Admit to telemetry for IV diuresis.  Time spent: Over 70 minutes were spent in the process of his admission.  Bobette Mo [facility] Pager [phone].

## 2015-06-25 NOTE — Evaluation (Signed)
Clinical/Bedside Swallow Evaluation Patient Details  Name: [name] MRN: [id number] Date of Birth: Feb 03, 1921  Today's Date: 06/25/2015 Time: SLP Start Time (ACUTE ONLY): 1126 SLP Stop Time (ACUTE ONLY): 1141 SLP Time Calculation (min) (ACUTE ONLY): 15 min  Past Medical History:  Past Medical History  Diagnosis Date  . ST elevation myocardial infarction (STEMI) of inferior wall (HCC) 05/2011; 03/10/2012    a) 11/'12: 100% RCA, dLM 80% -- POBA of RCA (for planned CABG, not done until re-admission with NSTEMI 1/'13);; b) 8/'13: 100% SVG-RCA (PCI & re-PCI), native RCA 100%; Patent LIMA-p-mLAD, SVG-OM  . CAD S/P percutaneous coronary angioplasty 11/'12; 8/31 & 9/1/'13    a) MV: 100% RCA-POBA, dLM left main 80%; b) 1/'13: NSTEMI --> CABG; c) 2013: 8/31 - 100% RCA & acute SVG-RCA, 100% SVG-OM, 90% LM, 80% p&mLAD, ~70% RI, 50% Cx --> PCI-SVG-RCA: Promus Element DES x 3 (prox 2.5 mm x 38 mm & 2.5 mm x 16 mm, distal 2.5 mm x 16 mm); on 9/1 - accute in-stent Thrombosis - Aspiration thrombectomy & PTCA  . S/P CABG x 4 08/05/11    Dr. Tyrone Sage: LIMA-p-mLAD, SVG-RPDA, SVG-OM; known  100% SVG-OM, Extensive PTCA of SVG-RCA;;  Hospital course complicated by Afib & PNA; after d/c cellulitis of SVG harvest site due to edema  . Episodic atrial fibrillation (HCC) 07/2011    Post-Op CABG  . Ischemic cardiomyopathy 03/2012    Echo: EF 45-50% - Mild basal- mid inferolateral Hypokinesis: Gr 1 DD, mildly increased PAP.  Marland Kitchen Dyslipidemia, goal LDL below 70     statin intolerance (lipitor, crestor drug reaction); Welchol & Zetia  . Hypertension, essential   . DM (diabetes mellitus), type 2 with renal complications (HCC)   . Chronic kidney disease (CKD) stage G3b/A1, moderately decreased glomerular filtration rate (GFR) between 30-44 mL/min/1.73 square meter and albuminuria creatinine ratio less than 30 mg/g   . History of pneumonia     post-op from CABG  . Hypothyroidism     on synthroid  . Headache(784.0)    . Osteoarthritis   . Anxiety     PRN Xanax   . Dermatophytosis of nail   . Gout     on Allopurinol  . Lower extremity edema     chronic  . GERD (gastroesophageal reflux disease) 04/26/2015  . Acute CHF (congestive heart failure) (HCC) 06/24/2015   Past Surgical History:  Past Surgical History  Procedure Laterality Date  . Cardiac catheterization  November 2012; August and September 2013    Patent LIMA-LAD, patent she had an OM, patent stented SVG-RCA; occluded native RCA, 90% ostial left main, tandem 80% proximal and mid LAD, 70% mid Circumflex  . Coronary angioplasty  November 2012    PTCA only of RCA and setting of inferior STEMI  . Coronary artery bypass graft  08/05/2011    Procedure: CORONARY ARTERY BYPASS GRAFTING (CABG);  Surgeon: [name], MD;  Location: [facility] OR;  Service: Open Heart Surgery;  Laterality: N/A;  coronary artery bypass graft times 4 using left internal mammary artery and right leg saphenous vein harvested endoscopically  . Joint replacement      both hips replaced  . I&d extremity  09/21/2011    Procedure: IRRIGATION AND DEBRIDEMENT EXTREMITY;  Surgeon: [name], MD;  Location: [facility] OR;  Service: Vascular;  Laterality: Right;  with wound vac placement  . Coronary angioplasty with stent placement  03/10/2012     infferior STEMI: Occluded native RCA and SVG-RCA --> thrombectomy  and 3 stent placement to the SVG-RCA (2.5 mm right 38 mm and 2 proximal distal overlapping 2.5 mm x 16 mm Promus DES stents:  . Coronary angioplasty  03/11/2012    Inferior STEMI #3: Reoccluded SVG-RCA; extensive thrombectomy and post dilation PTCA  . Transthoracic echocardiogram  September 2013    EF 45-50%, mild hypokinesis of the basal and mid inferolateral wall, grade 1 diastolic dysfunction, mildly increased artery pressures.  . Left heart catheterization with coronary angiogram N/A 05/26/2011    Procedure: LEFT HEART CATHETERIZATION WITH CORONARY ANGIOGRAM;  Surgeon:  Marykay Lex, MD;  Location: [facility] CATH LAB;  Service: Cardiovascular;  Laterality: N/A;  . Percutaneous coronary intervention-balloon only N/A 05/26/2011    Procedure: PERCUTANEOUS CORONARY INTERVENTION-BALLOON ONLY;  Surgeon: Marykay Lex, MD;  Location: [facility] CATH LAB;  Service: Cardiovascular;  Laterality: N/A;  . Left heart cath N/A 03/10/2012    Procedure: LEFT HEART CATH;  Surgeon: Herby Abraham, MD;  Location: [facility] CATH LAB;  Service: Cardiovascular;  Laterality: N/A;  . Percutaneous coronary stent intervention (pci-s)  03/10/2012    Procedure: PERCUTANEOUS CORONARY STENT INTERVENTION (PCI-S);  Surgeon: Herby Abraham, MD;  Location: [facility] CATH LAB;  Service: Cardiovascular;;  . Left heart catheterization with coronary angiogram Bilateral 03/11/2012    Procedure: LEFT HEART CATHETERIZATION WITH CORONARY ANGIOGRAM;  Surgeon: Thurmon Fair, MD;  Location: [facility] CATH LAB;  Service: Cardiovascular;  Laterality: Bilateral;  . Ep implantable device N/A 02/24/2015    Procedure: Pacemaker Implant;  Surgeon: Thurmon Fair, MD;  Location: [facility] INVASIVE CV LAB;  Service: Cardiovascular;  Laterality: N/A;  . Ep implantable device N/A 04/17/2015    Procedure: Lead Extraction, Can Extraction;  Surgeon: Marinus Maw, MD;  Location: [facility] INVASIVE CV LAB;  Service: Cardiovascular;  Laterality: N/A;  . Cardiac catheterization N/A 04/17/2015    Procedure: Temporary Pacemaker;  Surgeon: Marinus Maw, MD;  Location: [facility] INVASIVE CV LAB;  Service: Cardiovascular;  Laterality: N/A;  . Ep implantable device N/A 04/21/2015    Procedure: Pacemaker Implant;  Surgeon: Marinus Maw, MD;  Location: [facility] INVASIVE CV LAB;  Service: Cardiovascular;  Laterality: N/A;  . Pacemaker lead removal  04/21/2015    Procedure: Pacemaker Lead Removal;  Surgeon: Marinus Maw, MD;  Location: [facility] INVASIVE CV LAB;  Service: Cardiovascular;;  LV    HPI:  81 y.o. female with a past medical history of CAD, CABG, diastolic CHF, hyperlipidemia,  hypertension, type 2 diabetes, chronic kidney disease, hypothyroidism, GERD who was brought into the emergency department by EMS after developing exertional shortness of breath shortly after eating dinner. CXR shows RLL infiltrate that could indicate PNA or localized edema.   Assessment / Plan / Recommendation Clinical Impression  Pt's oropharyngeal swallow appears within gross functional limits, although mastication is mildly slowed with regular textures, and pt does report difficulty at home chewing meats. She does have consistent, frequent eructation that follows thin liquid boluses, likely a result of an esophageal component. Recommend Dys 3 diet to facilitate oropharyngeal and esophageal clearance, continuing thin liquids. SLP to f/u briefly for tolerance given concern for PNA on CXR.    Aspiration Risk  Mild aspiration risk    Diet Recommendation  Dys 3 diet, thin liquids   Medication Administration: Whole meds with puree    Other  Recommendations Oral Care Recommendations: Oral care BID   Follow up Recommendations   (tba)    Frequency and Duration min 2x/week  1 week  Prognosis Prognosis for Safe Diet Advancement: Fair      Swallow Study   General Date of Onset: 06/24/15 HPI: 81 y.o. female with a past medical history of CAD, CABG, diastolic CHF, hyperlipidemia, hypertension, type 2 diabetes, chronic kidney disease, hypothyroidism, GERD who was brought into the emergency department by EMS after developing exertional shortness of breath shortly after eating dinner. CXR shows RLL infiltrate that could indicate PNA or localized edema. Type of Study: Bedside Swallow Evaluation Previous Swallow Assessment: none in chart Diet Prior to this Study: Regular;Thin liquids Temperature Spikes Noted: No Respiratory Status: Room air History of Recent Intubation: No Behavior/Cognition: Alert;Cooperative;Pleasant mood Oral Cavity Assessment: Within Functional Limits Oral Care  Completed by SLP: No Vision: Impaired for self-feeding Self-Feeding Abilities: Needs assist Patient Positioning: Upright in bed Baseline Vocal Quality: Normal Volitional Swallow: Unable to elicit    Oral/Motor/Sensory Function Overall Oral Motor/Sensory Function: Within functional limits   Ice Chips Ice chips: Not tested   Thin Liquid Thin Liquid: Within functional limits Presentation: Cup;Self Fed;Straw    Nectar Thick Nectar Thick Liquid: Not tested   Honey Thick Honey Thick Liquid: Not tested   Puree Puree: Within functional limits Presentation: Spoon   Solid Solid: Within functional limits      Maxcine Ham, M.A. CCC-SLP [phone]  Maxcine Ham 06/25/2015,11:54 AM

## 2015-06-25 NOTE — Care Management Note (Addendum)
Case Management Note  Patient Details  Name: [name] MRN: [id number] Date of Birth: 1921-01-07  Subjective/Objective:      Pt admitted with Acute CHF            Action/Plan:  Pt is from home alone, with strong family support.  Pts husband passed just last week.  Grand daughter at bedside; states that family members are with pt around the clock.  CM assessed pt for HF screening;  Pt has scale, CM educated both pt and grand daughter about the need to weigh daily, pt stated she doesn't eat salt.  Pt has wheelchair and walker at home, ambulates with walker in the home.  PT/OT has been ordered for evaluation.   Expected Discharge Date:                  Expected Discharge Plan:  Home/Self Care  In-House Referral:     Discharge planning Services  CM Consult  Post Acute Care Choice:    Choice offered to:     DME Arranged:    DME Agency:     HH Arranged:    HH Agency:     Status of Service:  In process, will continue to follow  Medicare Important Message Given:    Date Medicare IM Given:    Medicare IM give by:    Date Additional Medicare IM Given:    Additional Medicare Important Message give by:     If discussed at Long Length of Stay Meetings, dates discussed:    Additional Comments:  Cherylann Parr, RN 06/25/2015, 4:24 PM

## 2015-06-25 NOTE — Progress Notes (Addendum)
Patient seen and examined Sitting comfortably in the bed, 98% on room and, denies any cough  echocardiogram 02/24/2015.  ------------------------------------------------------------------- LV EF: 55% -  60%  Plan Acute diastolic CHF (congestive heart failure) (HCC) Continue telemetry, cardiac enzymes negative 2 Continue supplemental oxygen. Continue IV Lasix Chest x-ray shows on the vascular congestion bilateral pleural effusions, possible infiltrate in the right lung base  Probable aspiration pneumonia Started on Augmentin, speech therapy consultation   :  Dyslipidemia with statin intolerance Continue WelChol. Continue lifestyle modifications.   Hypothyroidism Continue levothyroxine supplementation. Monitor TSH periodically.   DM (diabetes mellitus), type 2 with renal complications (HCC) Start sliding scale insulin CBG monitoring while the patient is in the hospital.   CAD (coronary artery disease) Continue aspirin and beta blocker. Continue Proventil   GERD (gastroesophageal reflux disease) Pantoprazole 40 mg by mouth daily   Chronic kidney disease stage [name], creatinine better than baseline of 2.0, continue diuresis and monitor

## 2015-06-25 NOTE — Progress Notes (Signed)
Per daughter Barrett [facility]le [facility]) would like mother to be DNR.  Call placed to Dr. Susie Cassette and notified of request.  Dr. Susie Cassette states she will put in order.

## 2015-06-26 DIAGNOSIS — I5021 Acute systolic (congestive) heart failure: Secondary | ICD-10-CM

## 2015-06-26 LAB — COMPREHENSIVE METABOLIC PANEL
ALK PHOS: 128 U/L — AB (ref 38–126)
ALT: 11 U/L — AB (ref 14–54)
AST: 15 U/L (ref 15–41)
Albumin: 2.6 g/dL — ABNORMAL LOW (ref 3.5–5.0)
Anion gap: 7 (ref 5–15)
BILIRUBIN TOTAL: 0.5 mg/dL (ref 0.3–1.2)
BUN: 24 mg/dL — ABNORMAL HIGH (ref 6–20)
CALCIUM: 9.3 mg/dL (ref 8.9–10.3)
CO2: 27 mmol/L (ref 22–32)
CREATININE: 1.69 mg/dL — AB (ref 0.44–1.00)
Chloride: 112 mmol/L — ABNORMAL HIGH (ref 101–111)
GFR, EST AFRICAN AMERICAN: 29 mL/min — AB (ref >60)
GFR, EST NON AFRICAN AMERICAN: 25 mL/min — AB (ref >60)
Glucose, Bld: 74 mg/dL (ref 65–99)
Potassium: 3.6 mmol/L (ref 3.5–5.1)
SODIUM: 146 mmol/L — AB (ref 135–145)
TOTAL PROTEIN: 5.3 g/dL — AB (ref 6.5–8.1)

## 2015-06-26 LAB — CBC
HEMATOCRIT: 29.9 % — AB (ref 36.0–46.0)
HEMOGLOBIN: 9.3 g/dL — AB (ref 12.0–15.0)
MCH: 25.7 pg — AB (ref 26.0–34.0)
MCHC: 31.1 g/dL (ref 30.0–36.0)
MCV: 82.6 fL (ref 78.0–100.0)
Platelets: 133 10*3/uL — ABNORMAL LOW (ref 150–400)
RBC: 3.62 MIL/uL — AB (ref 3.87–5.11)
RDW: 23.1 % — ABNORMAL HIGH (ref 11.5–15.5)
WBC: 2.7 10*3/uL — ABNORMAL LOW (ref 4.0–10.5)

## 2015-06-26 LAB — GLUCOSE, CAPILLARY
GLUCOSE-CAPILLARY: 117 mg/dL — AB (ref 65–99)
Glucose-Capillary: 70 mg/dL (ref 65–99)

## 2015-06-26 MED ORDER — AMOXICILLIN-POT CLAVULANATE 500-125 MG PO TABS: 1.0000 | ORAL_TABLET | Freq: Two times a day (BID) | ORAL | Status: DC

## 2015-06-26 NOTE — Evaluation (Signed)
Occupational Therapy Evaluation Patient Details Name: [name] MRN: [id number] DOB: 1920/11/29 Today's Date: 06/26/2015    History of Present Illness Patient is a 81 y/o female with hx of CAD, CABG, diastolic CHF, hyperlipidemia, hypertension, type 2 diabetes, chronic kidney disease, hypothyroidism, GERD presents with SOB. CXR-shows vascular congestion, bilateral pleural effusions, possible infiltrate in the right lung base   Clinical Impression   Pt was able to sponge bathe, dress and toilet independently prior to admission.  She was assisted for showering and IADL. Pt presents with generalized weakness and impaired balance interfering with ability to perform at her baseline.  Will follow acutely.  Anticipate pt will return to her baseline as medical issues resolve and pt returns home to her supportive family.     Follow Up Recommendations  No OT follow up;Supervision/Assistance - 24 hour    Equipment Recommendations   (travel chair if covered by insurance, otherwise wants manual w/c with cushion)    Recommendations for Other Services       Precautions / Restrictions Precautions Precautions: Fall Restrictions Weight Bearing Restrictions: No      Mobility Bed Mobility      General bed mobility comments: pt in chair  Transfers Overall transfer level: Needs assistance Equipment used: Rolling walker (2 wheeled) Transfers: Sit to/from Stand Sit to Stand: Min guard (from recliner)         General transfer comment: min guard from recliner and 3 in 1, pt uses 3 in1 over toilet at home    Balance Overall balance assessment: Needs assistance Sitting-balance support: Feet supported;No upper extremity supported Sitting balance-Leahy Scale: Good     Standing balance support: During functional activity Standing balance-Leahy Scale: Poor Standing balance comment: Relient on RW for support and close Min guard-Min A for stability.                           ADL Overall ADL's : Needs assistance/impaired Eating/Feeding: Independent;Sitting   Grooming: Wash/dry hands;Standing;Supervision/safety   Upper Body Bathing: Set up;Sitting   Lower Body Bathing: Min guard;Sit to/from stand   Upper Body Dressing : Set up;Sitting   Lower Body Dressing: Min guard;Sit to/from stand   Toilet Transfer: Min guard;RW;Ambulation;BSC   Toileting- Architect and Hygiene: Supervision/safety;Sitting/lateral lean       Functional mobility during ADLs: Min guard;Rolling walker General ADL Comments: Encouraged pt to move about with supervision at home once per hour.     Vision     Perception     Praxis      Pertinent Vitals/Pain Pain Assessment: No/denies pain     Hand Dominance Right   Extremity/Trunk Assessment Upper Extremity Assessment Upper Extremity Assessment: Generalized weakness   Lower Extremity Assessment Lower Extremity Assessment: Generalized weakness       Communication Communication Communication: No difficulties   Cognition Arousal/Alertness: Awake/alert Behavior During Therapy: WFL for tasks assessed/performed Overall Cognitive Status: Within Functional Limits for tasks assessed                     General Comments       Exercises       Shoulder Instructions      Home Living Family/patient expects to be discharged to:: Private residence Living Arrangements: Other relatives Available Help at Discharge: Personal care attendant;Available 24 hours/day;Family Type of Home: House Home Access: Stairs to enter [employer] of Steps: 3 Entrance Stairs-Rails: Left Home Layout: One level     Bathroom  Shower/Tub: Producer, television/film/video: Standard     Home Equipment: Environmental consultant - 2 wheels;Cane - single point;Wheelchair - manual;Bedside commode;Grab bars - tub/shower   Additional Comments: aide helps 3 x a week with shower      Prior Functioning/Environment Level of  Independence: Needs assistance  Gait / Transfers Assistance Needed: Uses RW for household ambulation.  ADL's / Homemaking Assistance Needed: A with homemaking tasks and A with showering.  Able to sponge bathe and dress self.   Comments: Husband passed away last week. Pt reports she is never home alone.    OT Diagnosis: Generalized weakness   OT Problem List: Decreased strength;Impaired balance (sitting and/or standing)   OT Treatment/Interventions: Self-care/ADL training;Patient/family education;Balance training;DME and/or AE instruction    OT Goals(Current goals can be found in the care plan section) Acute Rehab OT Goals Patient Stated Goal: to go home today OT Goal Formulation: With patient Time For Goal Achievement: 07/03/15 Potential to Achieve Goals: Good ADL Goals Pt Will Perform Lower Body Bathing: sit to/from stand;with set-up Pt Will Perform Lower Body Dressing: with set-up;sit to/from stand Pt Will Transfer to Toilet: with supervision;ambulating;bedside commode (over toilet) Pt Will Perform Toileting - Clothing Manipulation and hygiene: with supervision;sit to/from stand Pt Will Perform Tub/Shower Transfer: Shower transfer;ambulating;rolling walker;with min guard assist Pt/caregiver will Perform Home Exercise Program: Increased strength;Both right and left upper extremity;Independently (AROM exercises)  OT Frequency: Min 2X/week   Barriers to D/C:            Co-evaluation              End of Session Equipment Utilized During Treatment: Gait belt;Rolling walker  Activity Tolerance: Patient tolerated treatment well Patient left: in chair;with call bell/phone within reach;with family/visitor present   Time: 1610-9604 OT Time Calculation (min): 19 min Charges:  OT General Charges $OT Visit: 1 Procedure OT Evaluation $Initial OT Evaluation Tier I: 1 Procedure G-Codes:    Evern Bio 06/26/2015, 10:44 AM

## 2015-06-26 NOTE — Progress Notes (Signed)
Order rcvd to discharge.  Telemetry removed and CCMD notified.  IV removed with catheter intact.  Discharge education given to patient with son and granddaughter at bedside.  Emphasized need to weight daily and keep record to be able to report any weight changes to PCP.  Pt and family indicate understanding.  Pt denies chest pain or SOB.  Pt discharged via [facility] with volunteer.  Pt stable at discharge.

## 2015-06-26 NOTE — Discharge Summary (Signed)
Physician Discharge Summary  [name] MRN: [id number] DOB/AGE: [birth year] 81 y.o.  PCP: [name] Bolt, MD   Admit date: 06/24/2015 Discharge date: 06/26/2015  Discharge Diagnoses:     Principal Problem:   Acute CHF (congestive heart failure) ([place]) Active Problems:   Dyslipidemia with statin intolerance   Hypothyroidism   DM (diabetes mellitus), type 2 with renal complications (HCC)   CAD (coronary artery disease) of bypass graft, along with subtotal left main occlusion and and RCA occlusion - graft dependent   GERD (gastroesophageal reflux disease)   CHF exacerbation (HCC)    Follow-up recommendations Follow-up with PCP in 3-5 days , including all  additional recommended appointments as below Follow-up CBC, CMP in 3-5 days Dys 3 diet, thin liquids   Medication Administration: Whole meds with puree       Medication List    TAKE these medications        acetaminophen 325 MG tablet  Commonly known as:  TYLENOL  Take 650 mg by mouth 2 (two) times daily.     allopurinol 100 MG tablet  Commonly known as:  ZYLOPRIM  Take 100 mg by mouth daily.     amoxicillin-clavulanate 500-125 MG tablet  Commonly known as:  AUGMENTIN  Take 1 tablet (500 mg total) by mouth 2 (two) times daily.     carvedilol 6.25 MG tablet  Commonly known as:  COREG  Take 1 tablet (6.25 mg total) by mouth 2 (two) times daily with a meal.     colesevelam 625 MG tablet  Commonly known as:  WELCHOL  Take 1,875 mg by mouth See admin instructions. Take 3 tablets (1875 mg) with breakfast and with lunch     furosemide 40 MG tablet  Commonly known as:  LASIX  Take 40 mg by mouth daily.     levothyroxine 100 MCG tablet  Commonly known as:  SYNTHROID, LEVOTHROID  Take 100 mcg by mouth daily.     LORazepam 0.5 MG tablet  Commonly known as:  ATIVAN  Take 0.5 mg by mouth 3 (three) times daily.     pantoprazole 40 MG tablet  Commonly known as:  PROTONIX  Take 40 mg by mouth daily.      polyethylene glycol packet  Commonly known as:  MIRALAX / GLYCOLAX  Take 17 g by mouth daily as needed for moderate constipation.     polyvinyl alcohol 1.4 % ophthalmic solution  Commonly known as:  LIQUIFILM TEARS  Place 1 drop into both eyes 3 (three) times daily.     sodium bicarbonate 650 MG tablet  Take 650 mg by mouth daily.     ticagrelor 90 MG Tabs tablet  Commonly known as:  BRILINTA  TAKE 1 TABLET (90 MG TOTAL) BY MOUTH 2 (TWO) TIMES DAILY. Resume on 03/02/15         Discharge Condition: Stable   Discharge Instructions       Discharge Instructions    Diet - low sodium heart healthy    Complete by:  As directed      Diet - low sodium heart healthy    Complete by:  As directed      Increase activity slowly    Complete by:  As directed      Increase activity slowly    Complete by:  As directed            Allergies  Allergen Reactions  . Statins Rash  . Tape Rash    Please use paper  tape      Disposition: 03-Skilled Nursing Facility   Consults: *     Significant Diagnostic Studies:  Dg Chest 2 View  06/24/2015  CLINICAL DATA:  Shortness of breath for 1 day. EXAM: CHEST  2 VIEW COMPARISON:  04/22/2015 FINDINGS: With postoperative changes in the mediastinum. Cardiac pacemaker. Shallow inspiration. Cardiac enlargement with mild pulmonary vascular congestion. Airspace infiltration present in the right lung base could represent pneumonia or localized edema. Small bilateral pleural effusions. No pneumothorax. Degenerative changes in the right shoulder. Calcified and tortuous aorta. IMPRESSION: Cardiac enlargement with pulmonary vascular congestion and bilateral pleural effusions. Infiltration in the right lung base could indicate pneumonia or localized edema. Electronically Signed   By: Lucienne Capers M.D.   On: 06/24/2015 18:52      Filed Weights   06/24/15 2229 06/25/15 0541 06/26/15 0456  Weight: 64.1 kg (141 lb 5 oz) 64.4 kg (141 lb 15.6  oz) 62.2 kg (137 lb 2 oz)     Microbiology: No results found for this or any previous visit (from the past 240 hour(s)).     Blood Culture    Component Value Date/Time   SDES BLOOD RIGHT ARM 06/02/2015 1359   SPECREQUEST IN PEDIATRIC BOTTLE 2CC 06/02/2015 1359   CULT NO GROWTH 5 DAYS 06/02/2015 1359   REPTSTATUS 06/07/2015 FINAL 06/02/2015 1359      Labs: Results for orders placed or performed during the hospital encounter of 06/24/15 (from the past 48 hour(s))  Basic metabolic panel     Status: Abnormal   Collection Time: 06/24/15  7:08 PM  Result Value Ref Range   Sodium 149 (H) 135 - 145 mmol/L   Potassium 4.3 3.5 - 5.1 mmol/L   Chloride 118 (H) 101 - 111 mmol/L   CO2 23 22 - 32 mmol/L   Glucose, Bld 110 (H) 65 - 99 mg/dL   BUN 23 (H) 6 - 20 mg/dL   Creatinine, Ser 1.45 (H) 0.44 - 1.00 mg/dL   Calcium 9.2 8.9 - 10.3 mg/dL   GFR calc non Af Amer 30 (L) >60 mL/min   GFR calc Af Amer 35 (L) >60 mL/min    Comment: (NOTE) The eGFR has been calculated using the CKD EPI equation. This calculation has not been validated in all clinical situations. eGFR's persistently <60 mL/min signify possible Chronic Kidney Disease.    Anion gap 8 5 - 15  CBC     Status: Abnormal   Collection Time: 06/24/15  7:08 PM  Result Value Ref Range   WBC 3.9 (L) 4.0 - 10.5 K/uL   RBC 3.65 (L) 3.87 - 5.11 MIL/uL   Hemoglobin 9.1 (L) 12.0 - 15.0 g/dL   HCT 30.5 (L) 36.0 - 46.0 %   MCV 83.6 78.0 - 100.0 fL   MCH 24.9 (L) 26.0 - 34.0 pg   MCHC 29.8 (L) 30.0 - 36.0 g/dL   RDW 23.3 (H) 11.5 - 15.5 %   Platelets 168 150 - 400 K/uL  Brain natriuretic peptide     Status: Abnormal   Collection Time: 06/24/15  7:08 PM  Result Value Ref Range   B Natriuretic Peptide 841.3 (H) 0.0 - 100.0 pg/mL  I-stat troponin, ED (not at [facility])     Status: None   Collection Time: 06/24/15  7:10 PM  Result Value Ref Range   Troponin i, poc 0.02 0.00 - 0.08 ng/mL   Comment 3            Comment: Due  to the  release kinetics of cTnI, a negative result within the first hours of the onset of symptoms does not rule out myocardial infarction with certainty. If myocardial infarction is still suspected, repeat the test at appropriate intervals.   APTT     Status: Abnormal   Collection Time: 06/25/15 12:41 AM  Result Value Ref Range   aPTT 41 (H) 24 - 37 seconds    Comment:        IF BASELINE aPTT IS ELEVATED, SUGGEST PATIENT RISK ASSESSMENT BE USED TO DETERMINE APPROPRIATE ANTICOAGULANT THERAPY.   Protime-INR     Status: Abnormal   Collection Time: 06/25/15 12:41 AM  Result Value Ref Range   Prothrombin Time 15.6 (H) 11.6 - 15.2 seconds   INR 1.22 0.00 - 1.49  Magnesium     Status: None   Collection Time: 06/25/15 12:41 AM  Result Value Ref Range   Magnesium 1.9 1.7 - 2.4 mg/dL  Phosphorus     Status: None   Collection Time: 06/25/15 12:41 AM  Result Value Ref Range   Phosphorus 2.8 2.5 - 4.6 mg/dL  TSH     Status: None   Collection Time: 06/25/15  3:34 AM  Result Value Ref Range   TSH 4.481 0.350 - 4.500 uIU/mL  Comprehensive metabolic panel     Status: Abnormal   Collection Time: 06/25/15  3:34 AM  Result Value Ref Range   Sodium 148 (H) 135 - 145 mmol/L   Potassium 4.1 3.5 - 5.1 mmol/L   Chloride 116 (H) 101 - 111 mmol/L   CO2 26 22 - 32 mmol/L   Glucose, Bld 93 65 - 99 mg/dL   BUN 22 (H) 6 - 20 mg/dL   Creatinine, Ser 1.47 (H) 0.44 - 1.00 mg/dL   Calcium 9.2 8.9 - 10.3 mg/dL   Total Protein 4.9 (L) 6.5 - 8.1 g/dL   Albumin 2.5 (L) 3.5 - 5.0 g/dL   AST 16 15 - 41 U/L   ALT 12 (L) 14 - 54 U/L   Alkaline Phosphatase 120 38 - 126 U/L   Total Bilirubin 0.3 0.3 - 1.2 mg/dL   GFR calc non Af Amer 30 (L) >60 mL/min   GFR calc Af Amer 34 (L) >60 mL/min    Comment: (NOTE) The eGFR has been calculated using the CKD EPI equation. This calculation has not been validated in all clinical situations. eGFR's persistently <60 mL/min signify possible Chronic Kidney Disease.    Anion  gap 6 5 - 15  CBC     Status: Abnormal   Collection Time: 06/25/15  3:34 AM  Result Value Ref Range   WBC 3.3 (L) 4.0 - 10.5 K/uL   RBC 3.42 (L) 3.87 - 5.11 MIL/uL   Hemoglobin 8.5 (L) 12.0 - 15.0 g/dL   HCT 28.5 (L) 36.0 - 46.0 %   MCV 83.3 78.0 - 100.0 fL   MCH 24.9 (L) 26.0 - 34.0 pg   MCHC 29.8 (L) 30.0 - 36.0 g/dL   RDW 23.4 (H) 11.5 - 15.5 %   Platelets 138 (L) 150 - 400 K/uL  Troponin I     Status: None   Collection Time: 06/25/15  7:10 AM  Result Value Ref Range   Troponin I <0.03 <0.031 ng/mL    Comment:        NO INDICATION OF MYOCARDIAL INJURY.   Glucose, capillary     Status: None   Collection Time: 06/25/15  8:17 AM  Result Value Ref Range  Glucose-Capillary 83 65 - 99 mg/dL  Glucose, capillary     Status: Abnormal   Collection Time: 06/25/15 11:26 AM  Result Value Ref Range   Glucose-Capillary 101 (H) 65 - 99 mg/dL   Comment 1 Notify RN   Glucose, capillary     Status: Abnormal   Collection Time: 06/25/15  4:27 PM  Result Value Ref Range   Glucose-Capillary 101 (H) 65 - 99 mg/dL   Comment 1 Notify RN    Comment 2 Document in Chart   CBC     Status: Abnormal   Collection Time: 06/26/15  3:45 AM  Result Value Ref Range   WBC 2.7 (L) 4.0 - 10.5 K/uL   RBC 3.62 (L) 3.87 - 5.11 MIL/uL   Hemoglobin 9.3 (L) 12.0 - 15.0 g/dL   HCT 29.9 (L) 36.0 - 46.0 %   MCV 82.6 78.0 - 100.0 fL   MCH 25.7 (L) 26.0 - 34.0 pg   MCHC 31.1 30.0 - 36.0 g/dL   RDW 23.1 (H) 11.5 - 15.5 %   Platelets 133 (L) 150 - 400 K/uL    Comment: SPECIMEN CHECKED FOR CLOTS REPEATED TO VERIFY CONSISTENT WITH PREVIOUS RESULT   Comprehensive metabolic panel     Status: Abnormal   Collection Time: 06/26/15  3:45 AM  Result Value Ref Range   Sodium 146 (H) 135 - 145 mmol/L   Potassium 3.6 3.5 - 5.1 mmol/L   Chloride 112 (H) 101 - 111 mmol/L   CO2 27 22 - 32 mmol/L   Glucose, Bld 74 65 - 99 mg/dL   BUN 24 (H) 6 - 20 mg/dL   Creatinine, Ser 1.69 (H) 0.44 - 1.00 mg/dL   Calcium 9.3 8.9 -  10.3 mg/dL   Total Protein 5.3 (L) 6.5 - 8.1 g/dL   Albumin 2.6 (L) 3.5 - 5.0 g/dL   AST 15 15 - 41 U/L   ALT 11 (L) 14 - 54 U/L   Alkaline Phosphatase 128 (H) 38 - 126 U/L   Total Bilirubin 0.5 0.3 - 1.2 mg/dL   GFR calc non Af Amer 25 (L) >60 mL/min   GFR calc Af Amer 29 (L) >60 mL/min    Comment: (NOTE) The eGFR has been calculated using the CKD EPI equation. This calculation has not been validated in all clinical situations. eGFR's persistently <60 mL/min signify possible Chronic Kidney Disease.    Anion gap 7 5 - 15  Glucose, capillary     Status: None   Collection Time: 06/26/15  6:34 AM  Result Value Ref Range   Glucose-Capillary 70 65 - 99 mg/dL     Lipid Panel     Component Value Date/Time   CHOL 169 07/31/2011 0535   TRIG 64 07/31/2011 0535   HDL 68 07/31/2011 0535   CHOLHDL 2.5 07/31/2011 0535   VLDL 13 07/31/2011 0535   LDLCALC 88 07/31/2011 0535     Lab Results  Component Value Date   HGBA1C 5.9* 02/23/2015   HGBA1C 5.9* 07/30/2011   HGBA1C 5.9* 05/27/2011     Lab Results  Component Value Date   LDLCALC 88 07/31/2011   CREATININE 1.69* 06/26/2015     HPI: [name] is a 81 y.o. female with a past medical history of CAD, CABG, diastolic CHF, hyperlipidemia, hypertension, type 2 diabetes, chronic kidney disease, hypothyroidism, GERD who was brought into the emergency department by EMS after developing exertional shortness of breath shortly after eating dinner at around 1700 today. Her symptoms  improved significantly after EMS gave her oxygen. She denies chest pain, palpitations, dizziness, diaphoresis, but has been having worsening of lower extremity pitting edema over the last few days. Also, the patient has been not feeling well and has been anxious after her husband passed away last week.  When seen, the patient was in no acute distress. She denies having shortness of breath while on supplemental oxygen.  HOSPITAL COURSE:   Acute diastolic  CHF (congestive heart failure) (HCC) Telemetry showed paced rhythm, cardiac enzymes negative 2 Currently 97% on room air Continue  Lasix Chest x-ray shows on the vascular congestion bilateral pleural effusions, possible infiltrate in the right lung base, treated with Lasix, Augmentin, now stable  Probable aspiration pneumonia Started on Augmentin, continue for another 7 days, speech therapy consultation recommendations made  :  Dyslipidemia with statin intolerance Continue WelChol. Continue lifestyle modifications.   Hypothyroidism Continue levothyroxine supplementation. Monitor TSH periodically.   DM (diabetes mellitus), type 2 with renal complications (HCC) CBG stable in the hospital   CAD (coronary artery disease) Continue Brillinta and beta blocker. Continue Proventil   GERD (gastroesophageal reflux disease) Pantoprazole 40 mg by mouth daily   Chronic kidney disease stage [name], creatinine better than baseline of 2.0, continue diuresis and monitor   Discharge Exam:    Blood pressure 127/68, pulse 60, temperature 98 F (36.7 C), temperature source Axillary, resp. rate 17, weight 62.2 kg (137 lb 2 oz), SpO2 97 %.        General: Appears calm and comfortable  Eyes: PERRL, normal lids, irises & conjunctiva  ENT: grossly normal hearing, lips & tongue  Neck: no LAD, masses or thyromegaly  Cardiovascular: RRR, positive systolic murmur 2/6, no rub +1-1/2 LE edema.  Telemetry: SR, no arrhythmias   Respiratory: Bibasilar Rales. Normal respiratory effort.  Abdomen: soft, ntnd  Skin: no rash or induration seen on limited exam  Musculoskeletal: grossly normal tone BUE/BLE  Psychiatric: grossly normal mood and affect, speech fluent and appropriate  Neurologic: grossly non-focal.          Follow-up Information    Follow up with [name] Bolt, MD. Schedule an appointment as soon as possible for a visit in 3 days.   Specialty:  Endocrinology    Contact information:   [address] [place] [address] [phone]       Signed: Reyne Dumas 06/26/2015, 10:43 AM        Time spent >45 mins

## 2015-06-26 NOTE — Care Management Note (Addendum)
Case Management Note  Patient Details  Name: [name] MRN: [id number] Date of Birth: [birth year]  Subjective/Objective:      Pt admitted with Acute CHF             Action/Plan:  Pt is from home alone, with strong family support.  Pts husband passed just last week.  Grand daughter at bedside; states that family members are with pt around the clock.  CM assessed pt for HF screening;  Pt has scale, CM educated both pt and grand daughter about the need to weigh daily, pt stated she doesn't eat salt.  Pt has wheelchair and walker at home, ambulates with walker in the home.  PT/OT has been ordered for evaluation.   Expected Discharge Date:                  Expected Discharge Plan:  Home/Self Care  In-House Referral:     Discharge planning Services  CM Consult  Post Acute Care Choice:    Choice offered to:     DME Arranged:    DME Agency:     HH Arranged:  RN, PT, OT, Disease Management, Nurse's Aide HH Agency:   [facility]  Status of Service:  Completed, signed off  Medicare Important Message Given:    Date Medicare IM Given:    Medicare IM give by:    Date Additional Medicare IM Given:    Additional Medicare Important Message give by:     If discussed at Long Length of Stay Meetings, dates discussed:    Additional Comments: [facility] agreed to accept pt and will work with PCP to get orders signed.  Daughter Okey Regal and son Fayrene Fearing in agreement with agency.   Offered choice, pt decided to resume with [facility].  CM spoke with liason for [facility], agency is unable to accept referral at this time due to PCP constraints with HH orders.  CM contacted additional agency that has accepted PCP pt in the past.    CM reassessed pt with daughter Okey Regal and son Baldo Ash at bedside.  Pt was discharge to [facility] post last admit in October 2016, pt was discharged from facility to home.  Pt is was recently active with [facility] for HHPT, OT, RN (no longer receiving services).  Daughter verified that  pt will have 24 hour supervision provided by private pay care giver and family.  CM will contact [facility] and inform of admit.   Cherylann Parr, RN 06/26/2015, 1:09 PM

## 2015-06-26 NOTE — Evaluation (Signed)
Physical Therapy Evaluation Patient Details Name: [name] MRN: [id number] DOB: [birth year] Today's Date: 06/26/2015   History of Present Illness  Patient is a 81 y/o female with hx of CAD, CABG, diastolic CHF, hyperlipidemia, hypertension, type 2 diabetes, chronic kidney disease, hypothyroidism, GERD presents with SOB. CXR-shows vascular congestion, bilateral pleural effusions, possible infiltrate in the right lung base  Clinical Impression  Patient presents with functional limitations due to deficits listed in PT problem list (see below). Pt with generalized weakness and balance deficits impacting safe mobility. Pt has caregiver at home and reports having strong supportive family; states she is never home alone. Recommend close Min guard for safety at home during mobility until strength improves. Will follow acutely to maximize independence and mobility prior to return home.    Follow Up Recommendations Home health PT;Supervision for mobility/OOB    Equipment Recommendations  None recommended by PT    Recommendations for Other Services OT consult     Precautions / Restrictions Precautions Precautions: Fall Restrictions Weight Bearing Restrictions: No      Mobility  Bed Mobility Overal bed mobility: Needs Assistance Bed Mobility: Supine to Sit     Supine to sit: Supervision;HOB elevated     General bed mobility comments: Increased time. Use of rail. No physical assist needed.  Transfers Overall transfer level: Needs assistance Equipment used: Rolling walker (2 wheeled) Transfers: Sit to/from Stand Sit to Stand: Min guard;Min assist         General transfer comment: Min guard to boost from EOB with cues for hand placement, Min A to boost from low toilet using grab bar. Transferred to chair post ambulation bout  Ambulation/Gait Ambulation/Gait assistance: Min assist Ambulation Distance (Feet): 60 Feet Assistive device: Rolling walker (2 wheeled) Gait  Pattern/deviations: Step-through pattern;Decreased stride length;Trunk flexed;Drifts right/left;Decreased stance time - right Gait velocity: decreased Gait velocity interpretation: <1.8 ft/sec, indicative of risk for recurrent falls General Gait Details: Slow, unsteady gait with Min A for balance/support. Difficulty with turns. Reports right knee stiffness.  Stairs            Wheelchair Mobility    Modified Rankin (Stroke Patients Only)       Balance Overall balance assessment: Needs assistance Sitting-balance support: Feet supported;No upper extremity supported Sitting balance-Leahy Scale: Good     Standing balance support: During functional activity Standing balance-Leahy Scale: Poor Standing balance comment: Relient on RW for support and close Min guard-Min A for stability.                             Pertinent Vitals/Pain Pain Assessment: No/denies pain    Home Living Family/patient expects to be discharged to:: Private residence Living Arrangements: Other relatives (caregiver) Available Help at Discharge: Personal care attendant;Available 24 hours/day;Family Type of Home: House Home Access: Stairs to enter Entrance Stairs-Rails: Left Entrance Stairs-Number of Steps: 3 Home Layout: One level Home Equipment: Walker - 2 wheels;Cane - single point;Wheelchair - manual      Prior Function Level of Independence: Needs assistance   Gait / Transfers Assistance Needed: Uses RW for household ambulation.   ADL's / Homemaking Assistance Needed: A with homemaking tasks and A with bathing.  Able to dress self.  Comments: Husband passed away last week. Pt reports she is never home alone.     Hand Dominance        Extremity/Trunk Assessment   Upper Extremity Assessment: Defer to OT evaluation  Lower Extremity Assessment: Generalized weakness         Communication   Communication: No difficulties  Cognition Arousal/Alertness:  Awake/alert Behavior During Therapy: WFL for tasks assessed/performed Overall Cognitive Status: Within Functional Limits for tasks assessed                      General Comments General comments (skin integrity, edema, etc.): Family present towards end of ambulation.    Exercises        Assessment/Plan    PT Assessment Patient needs continued PT services  PT Diagnosis Difficulty walking;Generalized weakness   PT Problem List Decreased strength;Decreased activity tolerance;Decreased balance;Decreased mobility  PT Treatment Interventions Balance training;Gait training;Stair training;Functional mobility training;Therapeutic activities;Therapeutic exercise;Patient/family education   PT Goals (Current goals can be found in the Care Plan section) Acute Rehab PT Goals Patient Stated Goal: to go home today PT Goal Formulation: With patient Time For Goal Achievement: 07/10/15 Potential to Achieve Goals: Good    Frequency Min 3X/week   Barriers to discharge   Pt reports having 24/7 supervision?     Co-evaluation               End of Session Equipment Utilized During Treatment: Gait belt Activity Tolerance: Patient tolerated treatment well;Patient limited by fatigue Patient left: in chair;with call bell/phone within reach Nurse Communication: Mobility status         Time: 0922-0940 PT Time Calculation (min) (ACUTE ONLY): 18 min   Charges:   PT Evaluation $Initial PT Evaluation Tier I: 1 Procedure     PT G Codes:        Grayson White A Whitni Pasquini 06/26/2015, 10:08 AM Mylo Red, PT, DPT [phone]

## 2015-06-26 NOTE — Progress Notes (Signed)
Speech Language Pathology Treatment: Dysphagia  Patient Details Name: SHENEKIA RIESS MRN: [id number] DOB: 13-Dec-1920 Today's Date: 06/26/2015 Time: 1202-1217 SLP Time Calculation (min) (ACUTE ONLY): 15 min  Assessment / Plan / Recommendation Clinical Impression  Safe and efficient swallow function with Dys 3 texture and thin liquids. No s/s aspiration. Pt reports difficulty masticating meats, therefore will continue Dys 3 texture an thin liquids. Family present and aware. ST will sign off.    HPI HPI: 81 y.o. female with a past medical history of CAD, CABG, diastolic CHF, hyperlipidemia, hypertension, type 2 diabetes, chronic kidney disease, hypothyroidism, GERD who was brought into the emergency department by EMS after developing exertional shortness of breath shortly after eating dinner. CXR shows RLL infiltrate that could indicate PNA or localized edema.      SLP Plan  Discharge SLP treatment due to (comment);All goals met     Recommendations  Diet recommendations: Thin liquid;Dysphagia 3 (mechanical soft) Liquids provided via: Cup;Straw Medication Administration: Whole meds with liquid Supervision: Patient able to self feed Compensations: Small sips/bites;Slow rate Postural Changes and/or Swallow Maneuvers: Seated upright 90 degrees;Upright 30-60 min after meal              Oral Care Recommendations: Oral care BID Follow up Recommendations: None Plan: Discharge SLP treatment due to (comment);All goals met   Houston Siren 06/26/2015, 1:48 PM  Orbie Pyo Colvin Caroli.Ed [employer] [phone]

## 2015-06-26 NOTE — Discharge Instructions (Signed)
Dys 3 diet, thin liquids   Medication Administration: Whole meds with puree

## 2015-06-27 DIAGNOSIS — I5031 Acute diastolic (congestive) heart failure: Secondary | ICD-10-CM | POA: Diagnosis not present

## 2015-06-27 DIAGNOSIS — N183 Chronic kidney disease, stage 3 (moderate): Secondary | ICD-10-CM | POA: Diagnosis not present

## 2015-06-27 DIAGNOSIS — I130 Hypertensive heart and chronic kidney disease with heart failure and stage 1 through stage 4 chronic kidney disease, or unspecified chronic kidney disease: Secondary | ICD-10-CM | POA: Diagnosis not present

## 2015-06-27 DIAGNOSIS — E1122 Type 2 diabetes mellitus with diabetic chronic kidney disease: Secondary | ICD-10-CM | POA: Diagnosis not present

## 2015-06-30 DIAGNOSIS — I130 Hypertensive heart and chronic kidney disease with heart failure and stage 1 through stage 4 chronic kidney disease, or unspecified chronic kidney disease: Secondary | ICD-10-CM | POA: Diagnosis not present

## 2015-06-30 DIAGNOSIS — I5031 Acute diastolic (congestive) heart failure: Secondary | ICD-10-CM | POA: Diagnosis not present

## 2015-06-30 DIAGNOSIS — N183 Chronic kidney disease, stage 3 (moderate): Secondary | ICD-10-CM | POA: Diagnosis not present

## 2015-06-30 DIAGNOSIS — E1122 Type 2 diabetes mellitus with diabetic chronic kidney disease: Secondary | ICD-10-CM | POA: Diagnosis not present

## 2015-07-02 DIAGNOSIS — N183 Chronic kidney disease, stage 3 (moderate): Secondary | ICD-10-CM | POA: Diagnosis not present

## 2015-07-02 DIAGNOSIS — E1122 Type 2 diabetes mellitus with diabetic chronic kidney disease: Secondary | ICD-10-CM | POA: Diagnosis not present

## 2015-07-02 DIAGNOSIS — I5031 Acute diastolic (congestive) heart failure: Secondary | ICD-10-CM | POA: Diagnosis not present

## 2015-07-02 DIAGNOSIS — I130 Hypertensive heart and chronic kidney disease with heart failure and stage 1 through stage 4 chronic kidney disease, or unspecified chronic kidney disease: Secondary | ICD-10-CM | POA: Diagnosis not present

## 2015-07-07 DIAGNOSIS — I130 Hypertensive heart and chronic kidney disease with heart failure and stage 1 through stage 4 chronic kidney disease, or unspecified chronic kidney disease: Secondary | ICD-10-CM | POA: Diagnosis not present

## 2015-07-07 DIAGNOSIS — N183 Chronic kidney disease, stage 3 (moderate): Secondary | ICD-10-CM | POA: Diagnosis not present

## 2015-07-07 DIAGNOSIS — E1122 Type 2 diabetes mellitus with diabetic chronic kidney disease: Secondary | ICD-10-CM | POA: Diagnosis not present

## 2015-07-07 DIAGNOSIS — I5031 Acute diastolic (congestive) heart failure: Secondary | ICD-10-CM | POA: Diagnosis not present

## 2015-07-08 DIAGNOSIS — I5031 Acute diastolic (congestive) heart failure: Secondary | ICD-10-CM | POA: Diagnosis not present

## 2015-07-08 DIAGNOSIS — I130 Hypertensive heart and chronic kidney disease with heart failure and stage 1 through stage 4 chronic kidney disease, or unspecified chronic kidney disease: Secondary | ICD-10-CM | POA: Diagnosis not present

## 2015-07-08 DIAGNOSIS — N183 Chronic kidney disease, stage 3 (moderate): Secondary | ICD-10-CM | POA: Diagnosis not present

## 2015-07-08 DIAGNOSIS — E1122 Type 2 diabetes mellitus with diabetic chronic kidney disease: Secondary | ICD-10-CM | POA: Diagnosis not present

## 2015-07-09 DIAGNOSIS — M542 Cervicalgia: Secondary | ICD-10-CM | POA: Diagnosis not present

## 2015-07-09 DIAGNOSIS — E032 Hypothyroidism due to medicaments and other exogenous substances: Secondary | ICD-10-CM | POA: Diagnosis not present

## 2015-07-09 DIAGNOSIS — E118 Type 2 diabetes mellitus with unspecified complications: Secondary | ICD-10-CM | POA: Diagnosis not present

## 2015-07-10 DIAGNOSIS — I5031 Acute diastolic (congestive) heart failure: Secondary | ICD-10-CM | POA: Diagnosis not present

## 2015-07-10 DIAGNOSIS — N183 Chronic kidney disease, stage 3 (moderate): Secondary | ICD-10-CM | POA: Diagnosis not present

## 2015-07-10 DIAGNOSIS — E1122 Type 2 diabetes mellitus with diabetic chronic kidney disease: Secondary | ICD-10-CM | POA: Diagnosis not present

## 2015-07-10 DIAGNOSIS — I130 Hypertensive heart and chronic kidney disease with heart failure and stage 1 through stage 4 chronic kidney disease, or unspecified chronic kidney disease: Secondary | ICD-10-CM | POA: Diagnosis not present

## 2015-07-12 HISTORY — PX: TRANSTHORACIC ECHOCARDIOGRAM: SHX275

## 2015-07-14 DIAGNOSIS — E1122 Type 2 diabetes mellitus with diabetic chronic kidney disease: Secondary | ICD-10-CM | POA: Diagnosis not present

## 2015-07-14 DIAGNOSIS — I5031 Acute diastolic (congestive) heart failure: Secondary | ICD-10-CM | POA: Diagnosis not present

## 2015-07-14 DIAGNOSIS — I130 Hypertensive heart and chronic kidney disease with heart failure and stage 1 through stage 4 chronic kidney disease, or unspecified chronic kidney disease: Secondary | ICD-10-CM | POA: Diagnosis not present

## 2015-07-14 DIAGNOSIS — N183 Chronic kidney disease, stage 3 (moderate): Secondary | ICD-10-CM | POA: Diagnosis not present

## 2015-07-15 DIAGNOSIS — E1122 Type 2 diabetes mellitus with diabetic chronic kidney disease: Secondary | ICD-10-CM | POA: Diagnosis not present

## 2015-07-15 DIAGNOSIS — N183 Chronic kidney disease, stage 3 (moderate): Secondary | ICD-10-CM | POA: Diagnosis not present

## 2015-07-15 DIAGNOSIS — I5031 Acute diastolic (congestive) heart failure: Secondary | ICD-10-CM | POA: Diagnosis not present

## 2015-07-15 DIAGNOSIS — I130 Hypertensive heart and chronic kidney disease with heart failure and stage 1 through stage 4 chronic kidney disease, or unspecified chronic kidney disease: Secondary | ICD-10-CM | POA: Diagnosis not present

## 2015-07-16 DIAGNOSIS — E789 Disorder of lipoprotein metabolism, unspecified: Secondary | ICD-10-CM | POA: Diagnosis not present

## 2015-07-16 DIAGNOSIS — I10 Essential (primary) hypertension: Secondary | ICD-10-CM | POA: Diagnosis not present

## 2015-07-16 DIAGNOSIS — E032 Hypothyroidism due to medicaments and other exogenous substances: Secondary | ICD-10-CM | POA: Diagnosis not present

## 2015-07-17 DIAGNOSIS — I130 Hypertensive heart and chronic kidney disease with heart failure and stage 1 through stage 4 chronic kidney disease, or unspecified chronic kidney disease: Secondary | ICD-10-CM | POA: Diagnosis not present

## 2015-07-17 DIAGNOSIS — N183 Chronic kidney disease, stage 3 (moderate): Secondary | ICD-10-CM | POA: Diagnosis not present

## 2015-07-17 DIAGNOSIS — I5031 Acute diastolic (congestive) heart failure: Secondary | ICD-10-CM | POA: Diagnosis not present

## 2015-07-17 DIAGNOSIS — E1122 Type 2 diabetes mellitus with diabetic chronic kidney disease: Secondary | ICD-10-CM | POA: Diagnosis not present

## 2015-07-21 DIAGNOSIS — I5031 Acute diastolic (congestive) heart failure: Secondary | ICD-10-CM | POA: Diagnosis not present

## 2015-07-21 DIAGNOSIS — I130 Hypertensive heart and chronic kidney disease with heart failure and stage 1 through stage 4 chronic kidney disease, or unspecified chronic kidney disease: Secondary | ICD-10-CM | POA: Diagnosis not present

## 2015-07-21 DIAGNOSIS — E1122 Type 2 diabetes mellitus with diabetic chronic kidney disease: Secondary | ICD-10-CM | POA: Diagnosis not present

## 2015-07-21 DIAGNOSIS — N183 Chronic kidney disease, stage 3 (moderate): Secondary | ICD-10-CM | POA: Diagnosis not present

## 2015-07-22 DIAGNOSIS — N183 Chronic kidney disease, stage 3 (moderate): Secondary | ICD-10-CM | POA: Diagnosis not present

## 2015-07-22 DIAGNOSIS — I130 Hypertensive heart and chronic kidney disease with heart failure and stage 1 through stage 4 chronic kidney disease, or unspecified chronic kidney disease: Secondary | ICD-10-CM | POA: Diagnosis not present

## 2015-07-22 DIAGNOSIS — E1122 Type 2 diabetes mellitus with diabetic chronic kidney disease: Secondary | ICD-10-CM | POA: Diagnosis not present

## 2015-07-22 DIAGNOSIS — I5031 Acute diastolic (congestive) heart failure: Secondary | ICD-10-CM | POA: Diagnosis not present

## 2015-07-23 ENCOUNTER — Telehealth: Payer: Self-pay | Admitting: Cardiovascular Disease

## 2015-07-23 DIAGNOSIS — E1122 Type 2 diabetes mellitus with diabetic chronic kidney disease: Secondary | ICD-10-CM | POA: Diagnosis not present

## 2015-07-23 DIAGNOSIS — N183 Chronic kidney disease, stage 3 (moderate): Secondary | ICD-10-CM | POA: Diagnosis not present

## 2015-07-23 DIAGNOSIS — I130 Hypertensive heart and chronic kidney disease with heart failure and stage 1 through stage 4 chronic kidney disease, or unspecified chronic kidney disease: Secondary | ICD-10-CM | POA: Diagnosis not present

## 2015-07-23 DIAGNOSIS — I5031 Acute diastolic (congestive) heart failure: Secondary | ICD-10-CM | POA: Diagnosis not present

## 2015-07-23 NOTE — Telephone Encounter (Signed)
Pt had some leg numbness and was rubbing leg the other day, per daughter's report. No swelling, no pain at site, no o/s.  Pt has not complained of problem for past 2 days. Advised to call if symptoms return. Caller voiced understanding.

## 2015-07-23 NOTE — Telephone Encounter (Signed)
Pt is having numbness in her left leg.Is this something she need to be concerned about?

## 2015-07-24 DIAGNOSIS — N183 Chronic kidney disease, stage 3 (moderate): Secondary | ICD-10-CM | POA: Diagnosis not present

## 2015-07-24 DIAGNOSIS — E1122 Type 2 diabetes mellitus with diabetic chronic kidney disease: Secondary | ICD-10-CM | POA: Diagnosis not present

## 2015-07-24 DIAGNOSIS — I130 Hypertensive heart and chronic kidney disease with heart failure and stage 1 through stage 4 chronic kidney disease, or unspecified chronic kidney disease: Secondary | ICD-10-CM | POA: Diagnosis not present

## 2015-07-24 DIAGNOSIS — I5031 Acute diastolic (congestive) heart failure: Secondary | ICD-10-CM | POA: Diagnosis not present

## 2015-07-28 DIAGNOSIS — E1122 Type 2 diabetes mellitus with diabetic chronic kidney disease: Secondary | ICD-10-CM | POA: Diagnosis not present

## 2015-07-28 DIAGNOSIS — I5031 Acute diastolic (congestive) heart failure: Secondary | ICD-10-CM | POA: Diagnosis not present

## 2015-07-28 DIAGNOSIS — N183 Chronic kidney disease, stage 3 (moderate): Secondary | ICD-10-CM | POA: Diagnosis not present

## 2015-07-28 DIAGNOSIS — I130 Hypertensive heart and chronic kidney disease with heart failure and stage 1 through stage 4 chronic kidney disease, or unspecified chronic kidney disease: Secondary | ICD-10-CM | POA: Diagnosis not present

## 2015-07-29 ENCOUNTER — Inpatient Hospital Stay (HOSPITAL_COMMUNITY)
Admission: EM | Admit: 2015-07-29 | Discharge: 2015-08-05 | DRG: 641 | Disposition: A | Discharge status: Skilled Nursing Facility | Payer: Medicare Other | Attending: Family Medicine | Admitting: Family Medicine

## 2015-07-29 ENCOUNTER — Encounter (HOSPITAL_COMMUNITY): Payer: Self-pay | Admitting: *Deleted

## 2015-07-29 ENCOUNTER — Emergency Department (HOSPITAL_COMMUNITY): Payer: Medicare Other

## 2015-07-29 DIAGNOSIS — D638 Anemia in other chronic diseases classified elsewhere: Secondary | ICD-10-CM | POA: Diagnosis present

## 2015-07-29 DIAGNOSIS — R4182 Altered mental status, unspecified: Secondary | ICD-10-CM

## 2015-07-29 DIAGNOSIS — F4329 Adjustment disorder with other symptoms: Secondary | ICD-10-CM | POA: Diagnosis not present

## 2015-07-29 DIAGNOSIS — K219 Gastro-esophageal reflux disease without esophagitis: Secondary | ICD-10-CM | POA: Diagnosis present

## 2015-07-29 DIAGNOSIS — Z66 Do not resuscitate: Secondary | ICD-10-CM | POA: Diagnosis present

## 2015-07-29 DIAGNOSIS — D539 Nutritional anemia, unspecified: Secondary | ICD-10-CM | POA: Diagnosis present

## 2015-07-29 DIAGNOSIS — F0390 Unspecified dementia without behavioral disturbance: Secondary | ICD-10-CM | POA: Diagnosis not present

## 2015-07-29 DIAGNOSIS — R0602 Shortness of breath: Secondary | ICD-10-CM

## 2015-07-29 DIAGNOSIS — R7989 Other specified abnormal findings of blood chemistry: Secondary | ICD-10-CM | POA: Diagnosis present

## 2015-07-29 DIAGNOSIS — Z955 Presence of coronary angioplasty implant and graft: Secondary | ICD-10-CM | POA: Diagnosis not present

## 2015-07-29 DIAGNOSIS — E861 Hypovolemia: Secondary | ICD-10-CM | POA: Diagnosis not present

## 2015-07-29 DIAGNOSIS — Z950 Presence of cardiac pacemaker: Secondary | ICD-10-CM

## 2015-07-29 DIAGNOSIS — E1122 Type 2 diabetes mellitus with diabetic chronic kidney disease: Secondary | ICD-10-CM | POA: Diagnosis not present

## 2015-07-29 DIAGNOSIS — E785 Hyperlipidemia, unspecified: Secondary | ICD-10-CM | POA: Diagnosis not present

## 2015-07-29 DIAGNOSIS — F3289 Other specified depressive episodes: Secondary | ICD-10-CM | POA: Diagnosis not present

## 2015-07-29 DIAGNOSIS — R609 Edema, unspecified: Secondary | ICD-10-CM

## 2015-07-29 DIAGNOSIS — F05 Delirium due to known physiological condition: Secondary | ICD-10-CM | POA: Diagnosis present

## 2015-07-29 DIAGNOSIS — I517 Cardiomegaly: Secondary | ICD-10-CM | POA: Diagnosis not present

## 2015-07-29 DIAGNOSIS — Z888 Allergy status to other drugs, medicaments and biological substances status: Secondary | ICD-10-CM

## 2015-07-29 DIAGNOSIS — N183 Chronic kidney disease, stage 3 unspecified: Secondary | ICD-10-CM | POA: Diagnosis present

## 2015-07-29 DIAGNOSIS — Z91048 Other nonmedicinal substance allergy status: Secondary | ICD-10-CM

## 2015-07-29 DIAGNOSIS — F4381 Prolonged grief disorder: Secondary | ICD-10-CM | POA: Diagnosis present

## 2015-07-29 DIAGNOSIS — F0391 Unspecified dementia with behavioral disturbance: Secondary | ICD-10-CM | POA: Diagnosis present

## 2015-07-29 DIAGNOSIS — E039 Hypothyroidism, unspecified: Secondary | ICD-10-CM | POA: Diagnosis not present

## 2015-07-29 DIAGNOSIS — I130 Hypertensive heart and chronic kidney disease with heart failure and stage 1 through stage 4 chronic kidney disease, or unspecified chronic kidney disease: Secondary | ICD-10-CM | POA: Diagnosis not present

## 2015-07-29 DIAGNOSIS — E1129 Type 2 diabetes mellitus with other diabetic kidney complication: Secondary | ICD-10-CM | POA: Diagnosis present

## 2015-07-29 DIAGNOSIS — I5042 Chronic combined systolic (congestive) and diastolic (congestive) heart failure: Secondary | ICD-10-CM | POA: Diagnosis present

## 2015-07-29 DIAGNOSIS — F32A Depression, unspecified: Secondary | ICD-10-CM | POA: Diagnosis present

## 2015-07-29 DIAGNOSIS — I442 Atrioventricular block, complete: Secondary | ICD-10-CM | POA: Diagnosis present

## 2015-07-29 DIAGNOSIS — I5031 Acute diastolic (congestive) heart failure: Secondary | ICD-10-CM | POA: Diagnosis not present

## 2015-07-29 DIAGNOSIS — Z951 Presence of aortocoronary bypass graft: Secondary | ICD-10-CM

## 2015-07-29 DIAGNOSIS — S0990XA Unspecified injury of head, initial encounter: Secondary | ICD-10-CM

## 2015-07-29 DIAGNOSIS — I2510 Atherosclerotic heart disease of native coronary artery without angina pectoris: Secondary | ICD-10-CM | POA: Diagnosis present

## 2015-07-29 DIAGNOSIS — E860 Dehydration: Secondary | ICD-10-CM | POA: Diagnosis present

## 2015-07-29 DIAGNOSIS — K5900 Constipation, unspecified: Secondary | ICD-10-CM | POA: Diagnosis not present

## 2015-07-29 DIAGNOSIS — M109 Gout, unspecified: Secondary | ICD-10-CM | POA: Diagnosis present

## 2015-07-29 DIAGNOSIS — I255 Ischemic cardiomyopathy: Secondary | ICD-10-CM | POA: Diagnosis not present

## 2015-07-29 DIAGNOSIS — R0902 Hypoxemia: Secondary | ICD-10-CM

## 2015-07-29 DIAGNOSIS — F329 Major depressive disorder, single episode, unspecified: Secondary | ICD-10-CM | POA: Diagnosis present

## 2015-07-29 DIAGNOSIS — R410 Disorientation, unspecified: Secondary | ICD-10-CM | POA: Diagnosis present

## 2015-07-29 DIAGNOSIS — E870 Hyperosmolality and hypernatremia: Secondary | ICD-10-CM | POA: Diagnosis present

## 2015-07-29 DIAGNOSIS — R600 Localized edema: Secondary | ICD-10-CM | POA: Diagnosis not present

## 2015-07-29 DIAGNOSIS — I252 Old myocardial infarction: Secondary | ICD-10-CM

## 2015-07-29 DIAGNOSIS — N179 Acute kidney failure, unspecified: Secondary | ICD-10-CM | POA: Diagnosis not present

## 2015-07-29 HISTORY — DX: Disorientation, unspecified: R41.0

## 2015-07-29 HISTORY — DX: Presence of cardiac pacemaker: Z95.0

## 2015-07-29 HISTORY — DX: Major depressive disorder, single episode, unspecified: F32.9

## 2015-07-29 LAB — CBC WITH DIFFERENTIAL/PLATELET
BASOS ABS: 0 10*3/uL (ref 0.0–0.1)
Basophils Relative: 0 %
EOS ABS: 0 10*3/uL (ref 0.0–0.7)
Eosinophils Relative: 0 %
HEMATOCRIT: 36.7 % (ref 36.0–46.0)
HEMOGLOBIN: 11.4 g/dL — AB (ref 12.0–15.0)
LYMPHS PCT: 11 %
Lymphs Abs: 0.9 10*3/uL (ref 0.7–4.0)
MCH: 25.4 pg — ABNORMAL LOW (ref 26.0–34.0)
MCHC: 31.1 g/dL (ref 30.0–36.0)
MCV: 81.9 fL (ref 78.0–100.0)
MONOS PCT: 6 %
Monocytes Absolute: 0.5 10*3/uL (ref 0.1–1.0)
NEUTROS ABS: 6.8 10*3/uL (ref 1.7–7.7)
NEUTROS PCT: 83 %
Platelets: 155 10*3/uL (ref 150–400)
RBC: 4.48 MIL/uL (ref 3.87–5.11)
RDW: 22 % — ABNORMAL HIGH (ref 11.5–15.5)
WBC: 8.2 10*3/uL (ref 4.0–10.5)

## 2015-07-29 LAB — COMPREHENSIVE METABOLIC PANEL
ALBUMIN: 3.4 g/dL — AB (ref 3.5–5.0)
ALK PHOS: 151 U/L — AB (ref 38–126)
ALT: 10 U/L — AB (ref 14–54)
ANION GAP: 15 (ref 5–15)
AST: 18 U/L (ref 15–41)
BILIRUBIN TOTAL: 0.8 mg/dL (ref 0.3–1.2)
BUN: 39 mg/dL — AB (ref 6–20)
CALCIUM: 10.3 mg/dL (ref 8.9–10.3)
CO2: 21 mmol/L — AB (ref 22–32)
Chloride: 109 mmol/L (ref 101–111)
Creatinine, Ser: 2.14 mg/dL — ABNORMAL HIGH (ref 0.44–1.00)
GFR calc Af Amer: 22 mL/min — ABNORMAL LOW (ref >60)
GFR calc non Af Amer: 19 mL/min — ABNORMAL LOW (ref >60)
GLUCOSE: 120 mg/dL — AB (ref 65–99)
Potassium: 5 mmol/L (ref 3.5–5.1)
SODIUM: 145 mmol/L (ref 135–145)
TOTAL PROTEIN: 7 g/dL (ref 6.5–8.1)

## 2015-07-29 LAB — URINALYSIS, ROUTINE W REFLEX MICROSCOPIC
Bilirubin Urine: NEGATIVE
GLUCOSE, UA: NEGATIVE mg/dL
Hgb urine dipstick: NEGATIVE
KETONES UR: NEGATIVE mg/dL
LEUKOCYTES UA: NEGATIVE
NITRITE: NEGATIVE
PROTEIN: NEGATIVE mg/dL
Specific Gravity, Urine: 1.015 (ref 1.005–1.030)
pH: 5.5 (ref 5.0–8.0)

## 2015-07-29 LAB — I-STAT CG4 LACTIC ACID, ED: LACTIC ACID, VENOUS: 2.39 mmol/L — AB (ref 0.5–2.0)

## 2015-07-29 LAB — CBG MONITORING, ED: Glucose-Capillary: 116 mg/dL — ABNORMAL HIGH (ref 65–99)

## 2015-07-29 MED ORDER — SODIUM CHLORIDE 0.9 % IV BOLUS (SEPSIS)
250.0000 mL | Freq: Once | INTRAVENOUS | Status: AC
  Administered 2015-07-30: 250 mL via INTRAVENOUS

## 2015-07-29 MED ORDER — SODIUM CHLORIDE 0.9 % IV SOLN
INTRAVENOUS | Status: DC
  Administered 2015-07-30: 01:00:00 via INTRAVENOUS

## 2015-07-29 NOTE — ED Notes (Signed)
Patient presents with family stating she hasn't been eating or drinking since yesterday at 5pm   Son stated she would grimace every time "water would come out" and he finally got her to say her lower abd was hurting

## 2015-07-29 NOTE — ED Provider Notes (Signed)
CSN: [id number]     Arrival date & time 07/29/15  2119 History   First MD Initiated Contact with Patient 07/29/15 2337     Chief Complaint  Patient presents with  . Dementia  . Aggressive Behavior     (Consider location/radiation/quality/duration/timing/severity/associated sxs/prior Treatment) HPI   Patient has a POA who is not present and is DNR. Level V Caveat- AMS PCP: Dr. Juleen China  Patient presents to the emergency department with a past medical history of MI, dementia, hypertension, diabetes, pacemaker brought in by the family for evaluation of agitation and hallucinations. This is the symptoms started late yesterday evening she started hallucinating believing people are sticking things in her legs when they worked, she would not eat or drink anything since yesterday evening and has not had anything since. They also report that she did not go to sleep at all last night that she stayed awake sitting in her chair. Today she refuses to get up or follow directions.  The family is concerned that she might have infection because the last time she acted this way she had an infection somewhere. They deny any recent fall. She is not been coughing or short of breath. He reports that her husband of 72 years passed away one month ago.  Past Medical History  Diagnosis Date  . ST elevation myocardial infarction (STEMI) of inferior wall (HCC) 05/2011; 03/10/2012    a) 11/'12: 100% RCA, dLM 80% -- POBA of RCA (for planned CABG, not done until re-admission with NSTEMI 1/'13);; b) 8/'13: 100% SVG-RCA (PCI & re-PCI), native RCA 100%; Patent LIMA-p-mLAD, SVG-OM  . CAD S/P percutaneous coronary angioplasty 11/'12; 8/31 & 9/1/'13    a) MV: 100% RCA-POBA, dLM left main 80%; b) 1/'13: NSTEMI --> CABG; c) 2013: 8/31 - 100% RCA & acute SVG-RCA, 100% SVG-OM, 90% LM, 80% p&mLAD, ~70% RI, 50% Cx --> PCI-SVG-RCA: Promus Element DES x 3 (prox 2.5 mm x 38 mm & 2.5 mm x 16 mm, distal 2.5 mm x 16 mm); on 9/1 - accute  in-stent Thrombosis - Aspiration thrombectomy & PTCA  . S/P CABG x 4 08/05/11    Dr. Tyrone Sage: LIMA-p-mLAD, SVG-RPDA, SVG-OM; known  100% SVG-OM, Extensive PTCA of SVG-RCA;;  Hospital course complicated by Afib & PNA; after d/c cellulitis of SVG harvest site due to edema  . Episodic atrial fibrillation (HCC) 07/2011    Post-Op CABG  . Ischemic cardiomyopathy 03/2012    Echo: EF 45-50% - Mild basal- mid inferolateral Hypokinesis: Gr 1 DD, mildly increased PAP.  Marland Kitchen Dyslipidemia, goal LDL below 70     statin intolerance (lipitor, crestor drug reaction); Welchol & Zetia  . Hypertension, essential   . DM (diabetes mellitus), type 2 with renal complications (HCC)   . Chronic kidney disease (CKD) stage G3b/A1, moderately decreased glomerular filtration rate (GFR) between 30-44 mL/min/1.73 square meter and albuminuria creatinine ratio less than 30 mg/g   . History of pneumonia     post-op from CABG  . Hypothyroidism     on synthroid  . Headache(784.0)   . Osteoarthritis   . Anxiety     PRN Xanax   . Dermatophytosis of nail   . Gout     on Allopurinol  . Lower extremity edema     chronic  . GERD (gastroesophageal reflux disease) 04/26/2015  . Acute CHF (congestive heart failure) (HCC) 06/24/2015   Past Surgical History  Procedure Laterality Date  . Cardiac catheterization  November 2012; August and September 2013  Patent LIMA-LAD, patent she had an OM, patent stented SVG-RCA; occluded native RCA, 90% ostial left main, tandem 80% proximal and mid LAD, 70% mid Circumflex  . Coronary angioplasty  November 2012    PTCA only of RCA and setting of inferior STEMI  . Coronary artery bypass graft  08/05/2011    Procedure: CORONARY ARTERY BYPASS GRAFTING (CABG);  Surgeon: [name], MD;  Location: [facility] OR;  Service: Open Heart Surgery;  Laterality: N/A;  coronary artery bypass graft times 4 using left internal mammary artery and right leg saphenous vein harvested endoscopically  . Joint  replacement      both hips replaced  . I&d extremity  09/21/2011    Procedure: IRRIGATION AND DEBRIDEMENT EXTREMITY;  Surgeon: [name], MD;  Location: [facility] OR;  Service: Vascular;  Laterality: Right;  with wound vac placement  . Coronary angioplasty with stent placement  03/10/2012     infferior STEMI: Occluded native RCA and SVG-RCA --> thrombectomy and 3 stent placement to the SVG-RCA (2.5 mm right 38 mm and 2 proximal distal overlapping 2.5 mm x 16 mm Promus DES stents:  . Coronary angioplasty  03/11/2012    Inferior STEMI #3: Reoccluded SVG-RCA; extensive thrombectomy and post dilation PTCA  . Transthoracic echocardiogram  September 2013    EF 45-50%, mild hypokinesis of the basal and mid inferolateral wall, grade 1 diastolic dysfunction, mildly increased artery pressures.  . Left heart catheterization with coronary angiogram N/A 05/26/2011    Procedure: LEFT HEART CATHETERIZATION WITH CORONARY ANGIOGRAM;  Surgeon: Marykay Lex, MD;  Location: [facility] CATH LAB;  Service: Cardiovascular;  Laterality: N/A;  . Percutaneous coronary intervention-balloon only N/A 05/26/2011    Procedure: PERCUTANEOUS CORONARY INTERVENTION-BALLOON ONLY;  Surgeon: Marykay Lex, MD;  Location: [facility] CATH LAB;  Service: Cardiovascular;  Laterality: N/A;  . Left heart cath N/A 03/10/2012    Procedure: LEFT HEART CATH;  Surgeon: Herby Abraham, MD;  Location: [facility] CATH LAB;  Service: Cardiovascular;  Laterality: N/A;  . Percutaneous coronary stent intervention (pci-s)  03/10/2012    Procedure: PERCUTANEOUS CORONARY STENT INTERVENTION (PCI-S);  Surgeon: Herby Abraham, MD;  Location: [facility] CATH LAB;  Service: Cardiovascular;;  . Left heart catheterization with coronary angiogram Bilateral 03/11/2012    Procedure: LEFT HEART CATHETERIZATION WITH CORONARY ANGIOGRAM;  Surgeon: Thurmon Fair, MD;  Location: [facility] CATH LAB;  Service: Cardiovascular;  Laterality: Bilateral;  . Ep implantable device N/A 02/24/2015    Procedure:  Pacemaker Implant;  Surgeon: Thurmon Fair, MD;  Location: [facility] INVASIVE CV LAB;  Service: Cardiovascular;  Laterality: N/A;  . Ep implantable device N/A 04/17/2015    Procedure: Lead Extraction, Can Extraction;  Surgeon: Marinus Maw, MD;  Location: [facility] INVASIVE CV LAB;  Service: Cardiovascular;  Laterality: N/A;  . Cardiac catheterization N/A 04/17/2015    Procedure: Temporary Pacemaker;  Surgeon: Marinus Maw, MD;  Location: [facility] INVASIVE CV LAB;  Service: Cardiovascular;  Laterality: N/A;  . Ep implantable device N/A 04/21/2015    Procedure: Pacemaker Implant;  Surgeon: Marinus Maw, MD;  Location: [facility] INVASIVE CV LAB;  Service: Cardiovascular;  Laterality: N/A;  . Pacemaker lead removal  04/21/2015    Procedure: Pacemaker Lead Removal;  Surgeon: Marinus Maw, MD;  Location: [facility] INVASIVE CV LAB;  Service: Cardiovascular;;  LV    Family History  Problem Relation Age of Onset  . Breast cancer Mother   . Heart disease Father    Social History  Substance Use Topics  .  Smoking status: Never Smoker   . Smokeless tobacco: Never Used  . Alcohol Use: 0.6 oz/week    1 Glasses of wine per week   OB History    No data available     Review of Systems  Level V Caveat - AMS  Allergies  Statins and Tape  Home Medications   Prior to Admission medications   Medication Sig Start Date End Date Taking? Authorizing Provider  acetaminophen (TYLENOL) 325 MG tablet Take 650 mg by mouth 2 (two) times daily.    Historical Provider, MD  allopurinol (ZYLOPRIM) 100 MG tablet Take 100 mg by mouth daily.     Historical Provider, MD  amoxicillin-clavulanate (AUGMENTIN) 500-125 MG tablet Take 1 tablet (500 mg total) by mouth 2 (two) times daily. Patient not taking: Reported on 07/30/2015 06/26/15   Richarda Overlie, MD  carvedilol (COREG) 6.25 MG tablet Take 1 tablet (6.25 mg total) by mouth 2 (two) times daily with a meal. 08/15/14   Marykay Lex, MD  colesevelam [facility]) 625 MG tablet Take 1,875 mg by  mouth See admin instructions. Take 3 tablets (1875 mg) with breakfast and with lunch    Historical Provider, MD  furosemide (LASIX) 40 MG tablet Take 40 mg by mouth daily.     Historical Provider, MD  levothyroxine (SYNTHROID, LEVOTHROID) 100 MCG tablet Take 100 mcg by mouth daily.     Historical Provider, MD  LORazepam (ATIVAN) 0.5 MG tablet Take 0.5 mg by mouth 3 (three) times daily.  01/16/13   Historical Provider, MD  pantoprazole (PROTONIX) 40 MG tablet Take 40 mg by mouth daily. 09/24/14   Historical Provider, MD  polyethylene glycol (MIRALAX / GLYCOLAX) packet Take 17 g by mouth daily as needed for moderate constipation.    Historical Provider, MD  polyvinyl alcohol (LIQUIFILM TEARS) 1.4 % ophthalmic solution Place 1 drop into both eyes 3 (three) times daily.    Historical Provider, MD  sodium bicarbonate 650 MG tablet Take 650 mg by mouth daily.      Historical Provider, MD  ticagrelor (BRILINTA) 90 MG TABS tablet TAKE 1 TABLET (90 MG TOTAL) BY MOUTH 2 (TWO) TIMES DAILY. Resume on 03/02/15 02/25/15   Marily Lente, NP   BP 133/82 mmHg  Pulse 93  Temp(Src)   Resp 16  SpO2 95% Physical Exam  Constitutional: She appears well-developed and well-nourished.  HENT:  Head: Normocephalic and atraumatic.  Eyes: Conjunctivae are normal. Pupils are equal, round, and reactive to light.  Neck: Trachea normal, normal range of motion and full passive range of motion without pain. Neck supple.  Cardiovascular: Normal rate, regular rhythm and normal pulses.   Pulmonary/Chest: Effort normal and breath sounds normal. Chest wall is not dull to percussion. She exhibits no tenderness, no crepitus, no edema, no deformity and no retraction.  Two pace maker scares, one on left and one on right side of chest  Abdominal: Soft. Normal appearance and bowel sounds are normal.  Abdomen is soft  Musculoskeletal: Normal range of motion.  Pt is unable to stand to transfer to bed.  Neurological: She is alert. She has  normal strength.  Unable due to pt being altered and agitated.  Skin: Skin is warm, dry and intact.  Psychiatric: She has a normal mood and affect. Her speech is normal. Judgment and thought content normal. She is agitated, aggressive and actively hallucinating. Cognition and memory are normal.    ED Course  Procedures (including critical care time) Labs Review Labs Reviewed  CBC WITH DIFFERENTIAL/PLATELET - Abnormal; Notable for the following:    Hemoglobin 11.4 (*)    MCH 25.4 (*)    RDW 22.0 (*)    All other components within normal limits  COMPREHENSIVE METABOLIC PANEL - Abnormal; Notable for the following:    CO2 21 (*)    Glucose, Bld 120 (*)    BUN 39 (*)    Creatinine, Ser 2.14 (*)    Albumin 3.4 (*)    ALT 10 (*)    Alkaline Phosphatase 151 (*)    GFR calc non Af Amer 19 (*)    GFR calc Af Amer 22 (*)    All other components within normal limits  CBG MONITORING, ED - Abnormal; Notable for the following:    Glucose-Capillary 116 (*)    All other components within normal limits  I-STAT CG4 LACTIC ACID, ED - Abnormal; Notable for the following:    Lactic Acid, Venous 2.39 (*)    All other components within normal limits  CBG MONITORING, ED - Abnormal; Notable for the following:    Glucose-Capillary 117 (*)    All other components within normal limits  CULTURE, BLOOD (ROUTINE X 2)  CULTURE, BLOOD (ROUTINE X 2)  URINALYSIS, ROUTINE W REFLEX MICROSCOPIC (NOT AT [facility])  I-STAT TROPOININ, ED  I-STAT CG4 LACTIC ACID, ED    Imaging Review Dg Chest Port 1 View  07/30/2015  CLINICAL DATA:  82 year old female with altered mental status EXAM: PORTABLE CHEST 1 VIEW COMPARISON:  Radiograph dated 06/24/2015 FINDINGS: Single-view of the chest demonstrates mild central vascular prominence likely a degree of congestive changes. No focal consolidation, pleural effusion, or pneumothorax. Stable cardiomegaly. Right pectoral pacemaker device. No acute osseous pathology. IMPRESSION: Stable  cardiomegaly with mild congestive changes. No focal consolidation. Electronically Signed   By: Elgie Collard M.D.   On: 07/30/2015 00:24   I have personally reviewed and evaluated these images and lab results as part of my medical decision-making.   EKG Interpretation   Date/Time:  Wednesday July 29 2015 23:58:02 EST Ventricular Rate:  89 PR Interval:  206 QRS Duration: 175 QT Interval:  436 QTC Calculation: 531 R Axis:   -75 Text Interpretation:  Sinus rhythm Left bundle branch block Left axis  deviation Baseline wander When compared with ECG of 06/24/2015 No  significant change was found Confirmed by [facility]  MD, Nicholos Johns [phone]) on  07/30/2015 12:36:20 AM      MDM   Final diagnoses:  Dehydration  Altered mental status, unspecified altered mental status type  Dementia, with behavioral disturbance   Dr. Clarene Duke has seen patient as well Patient is dehydrated with decreased creatinine and BUN. Elevated Lactic acid at 2.39 Patient is two weak to stand up, likely due to not eating. No obvious signs of infection noted on physical exam. Will need admission for AMS and dehydration.  UA and Chest xray normal, no source of infection found. Neg Troponin  Medications  sodium chloride 0.9 % bolus 250 mL (250 mLs Intravenous New Bag/Given 07/30/15 0031)  0.9 %  sodium chloride infusion (not administered)  0.9 %  sodium chloride infusion (not administered)   Patient admitted to Dr. Maryfrances Bunnell with [facility], inpatient, med-surg, [facility]. Added on rectal temp and blood cultures.  Marlon Pel, PA-C 07/30/15 0112  Samuel Jester, DO 08/01/15 2342

## 2015-07-29 NOTE — ED Notes (Signed)
Pt taken to FT room 2 for in and out catheterization.

## 2015-07-29 NOTE — ED Notes (Signed)
In and out done per protocol.  Pericare done.  Patient combative

## 2015-07-30 ENCOUNTER — Observation Stay (HOSPITAL_COMMUNITY): Payer: Medicare Other

## 2015-07-30 ENCOUNTER — Encounter (HOSPITAL_COMMUNITY): Payer: Self-pay | Admitting: Family Medicine

## 2015-07-30 DIAGNOSIS — I5042 Chronic combined systolic (congestive) and diastolic (congestive) heart failure: Secondary | ICD-10-CM | POA: Diagnosis present

## 2015-07-30 DIAGNOSIS — I442 Atrioventricular block, complete: Secondary | ICD-10-CM | POA: Diagnosis not present

## 2015-07-30 DIAGNOSIS — E1129 Type 2 diabetes mellitus with other diabetic kidney complication: Secondary | ICD-10-CM | POA: Diagnosis not present

## 2015-07-30 DIAGNOSIS — M109 Gout, unspecified: Secondary | ICD-10-CM | POA: Diagnosis present

## 2015-07-30 DIAGNOSIS — R410 Disorientation, unspecified: Secondary | ICD-10-CM | POA: Diagnosis not present

## 2015-07-30 DIAGNOSIS — F3289 Other specified depressive episodes: Secondary | ICD-10-CM | POA: Diagnosis present

## 2015-07-30 DIAGNOSIS — R7989 Other specified abnormal findings of blood chemistry: Secondary | ICD-10-CM | POA: Diagnosis present

## 2015-07-30 DIAGNOSIS — E872 Acidosis: Secondary | ICD-10-CM

## 2015-07-30 DIAGNOSIS — F0391 Unspecified dementia with behavioral disturbance: Secondary | ICD-10-CM | POA: Diagnosis present

## 2015-07-30 DIAGNOSIS — F05 Delirium due to known physiological condition: Secondary | ICD-10-CM | POA: Diagnosis not present

## 2015-07-30 DIAGNOSIS — D638 Anemia in other chronic diseases classified elsewhere: Secondary | ICD-10-CM | POA: Diagnosis present

## 2015-07-30 DIAGNOSIS — E034 Atrophy of thyroid (acquired): Secondary | ICD-10-CM

## 2015-07-30 DIAGNOSIS — I110 Hypertensive heart disease with heart failure: Secondary | ICD-10-CM | POA: Diagnosis not present

## 2015-07-30 DIAGNOSIS — R4182 Altered mental status, unspecified: Secondary | ICD-10-CM | POA: Diagnosis not present

## 2015-07-30 DIAGNOSIS — R278 Other lack of coordination: Secondary | ICD-10-CM | POA: Diagnosis not present

## 2015-07-30 DIAGNOSIS — E785 Hyperlipidemia, unspecified: Secondary | ICD-10-CM | POA: Diagnosis present

## 2015-07-30 DIAGNOSIS — R609 Edema, unspecified: Secondary | ICD-10-CM | POA: Diagnosis not present

## 2015-07-30 DIAGNOSIS — I255 Ischemic cardiomyopathy: Secondary | ICD-10-CM

## 2015-07-30 DIAGNOSIS — M6281 Muscle weakness (generalized): Secondary | ICD-10-CM | POA: Diagnosis not present

## 2015-07-30 DIAGNOSIS — R2681 Unsteadiness on feet: Secondary | ICD-10-CM | POA: Diagnosis not present

## 2015-07-30 DIAGNOSIS — E1122 Type 2 diabetes mellitus with diabetic chronic kidney disease: Secondary | ICD-10-CM | POA: Diagnosis present

## 2015-07-30 DIAGNOSIS — D539 Nutritional anemia, unspecified: Secondary | ICD-10-CM | POA: Diagnosis not present

## 2015-07-30 DIAGNOSIS — R52 Pain, unspecified: Secondary | ICD-10-CM | POA: Diagnosis not present

## 2015-07-30 DIAGNOSIS — R931 Abnormal findings on diagnostic imaging of heart and coronary circulation: Secondary | ICD-10-CM | POA: Diagnosis not present

## 2015-07-30 DIAGNOSIS — N179 Acute kidney failure, unspecified: Secondary | ICD-10-CM

## 2015-07-30 DIAGNOSIS — F4329 Adjustment disorder with other symptoms: Secondary | ICD-10-CM | POA: Diagnosis present

## 2015-07-30 DIAGNOSIS — I130 Hypertensive heart and chronic kidney disease with heart failure and stage 1 through stage 4 chronic kidney disease, or unspecified chronic kidney disease: Secondary | ICD-10-CM | POA: Diagnosis present

## 2015-07-30 DIAGNOSIS — K219 Gastro-esophageal reflux disease without esophagitis: Secondary | ICD-10-CM | POA: Diagnosis present

## 2015-07-30 DIAGNOSIS — E870 Hyperosmolality and hypernatremia: Secondary | ICD-10-CM | POA: Diagnosis not present

## 2015-07-30 DIAGNOSIS — E860 Dehydration: Secondary | ICD-10-CM | POA: Diagnosis not present

## 2015-07-30 DIAGNOSIS — N183 Chronic kidney disease, stage 3 (moderate): Secondary | ICD-10-CM | POA: Diagnosis not present

## 2015-07-30 DIAGNOSIS — Z91048 Other nonmedicinal substance allergy status: Secondary | ICD-10-CM | POA: Diagnosis not present

## 2015-07-30 DIAGNOSIS — Z955 Presence of coronary angioplasty implant and graft: Secondary | ICD-10-CM | POA: Diagnosis not present

## 2015-07-30 DIAGNOSIS — I2510 Atherosclerotic heart disease of native coronary artery without angina pectoris: Secondary | ICD-10-CM | POA: Diagnosis present

## 2015-07-30 DIAGNOSIS — E039 Hypothyroidism, unspecified: Secondary | ICD-10-CM | POA: Diagnosis present

## 2015-07-30 DIAGNOSIS — E861 Hypovolemia: Secondary | ICD-10-CM | POA: Diagnosis present

## 2015-07-30 DIAGNOSIS — I517 Cardiomegaly: Secondary | ICD-10-CM | POA: Diagnosis not present

## 2015-07-30 DIAGNOSIS — Z950 Presence of cardiac pacemaker: Secondary | ICD-10-CM | POA: Diagnosis not present

## 2015-07-30 DIAGNOSIS — S0990XA Unspecified injury of head, initial encounter: Secondary | ICD-10-CM | POA: Diagnosis not present

## 2015-07-30 DIAGNOSIS — Z9181 History of falling: Secondary | ICD-10-CM | POA: Diagnosis not present

## 2015-07-30 DIAGNOSIS — E871 Hypo-osmolality and hyponatremia: Secondary | ICD-10-CM | POA: Diagnosis not present

## 2015-07-30 DIAGNOSIS — Z66 Do not resuscitate: Secondary | ICD-10-CM | POA: Diagnosis present

## 2015-07-30 DIAGNOSIS — Z951 Presence of aortocoronary bypass graft: Secondary | ICD-10-CM | POA: Diagnosis not present

## 2015-07-30 DIAGNOSIS — I252 Old myocardial infarction: Secondary | ICD-10-CM | POA: Diagnosis not present

## 2015-07-30 DIAGNOSIS — K5900 Constipation, unspecified: Secondary | ICD-10-CM | POA: Diagnosis present

## 2015-07-30 DIAGNOSIS — E038 Other specified hypothyroidism: Secondary | ICD-10-CM

## 2015-07-30 DIAGNOSIS — Z888 Allergy status to other drugs, medicaments and biological substances status: Secondary | ICD-10-CM | POA: Diagnosis not present

## 2015-07-30 HISTORY — DX: Disorientation, unspecified: R41.0

## 2015-07-30 LAB — COMPREHENSIVE METABOLIC PANEL
ALK PHOS: 122 U/L (ref 38–126)
ALT: 8 U/L — ABNORMAL LOW (ref 14–54)
ANION GAP: 16 — AB (ref 5–15)
AST: 13 U/L — ABNORMAL LOW (ref 15–41)
Albumin: 2.9 g/dL — ABNORMAL LOW (ref 3.5–5.0)
BUN: 38 mg/dL — ABNORMAL HIGH (ref 6–20)
CALCIUM: 9.7 mg/dL (ref 8.9–10.3)
CHLORIDE: 113 mmol/L — AB (ref 101–111)
CO2: 20 mmol/L — AB (ref 22–32)
Creatinine, Ser: 1.97 mg/dL — ABNORMAL HIGH (ref 0.44–1.00)
GFR calc non Af Amer: 21 mL/min — ABNORMAL LOW (ref >60)
GFR, EST AFRICAN AMERICAN: 24 mL/min — AB (ref >60)
Glucose, Bld: 107 mg/dL — ABNORMAL HIGH (ref 65–99)
POTASSIUM: 5 mmol/L (ref 3.5–5.1)
SODIUM: 149 mmol/L — AB (ref 135–145)
Total Bilirubin: 0.7 mg/dL (ref 0.3–1.2)
Total Protein: 6 g/dL — ABNORMAL LOW (ref 6.5–8.1)

## 2015-07-30 LAB — BASIC METABOLIC PANEL
ANION GAP: 12 (ref 5–15)
BUN: 36 mg/dL — ABNORMAL HIGH (ref 6–20)
CO2: 20 mmol/L — ABNORMAL LOW (ref 22–32)
Calcium: 9.3 mg/dL (ref 8.9–10.3)
Chloride: 113 mmol/L — ABNORMAL HIGH (ref 101–111)
Creatinine, Ser: 1.77 mg/dL — ABNORMAL HIGH (ref 0.44–1.00)
GFR calc Af Amer: 28 mL/min — ABNORMAL LOW (ref >60)
GFR, EST NON AFRICAN AMERICAN: 24 mL/min — AB (ref >60)
GLUCOSE: 130 mg/dL — AB (ref 65–99)
POTASSIUM: 4.7 mmol/L (ref 3.5–5.1)
Sodium: 145 mmol/L (ref 135–145)

## 2015-07-30 LAB — I-STAT CG4 LACTIC ACID, ED: LACTIC ACID, VENOUS: 1.6 mmol/L (ref 0.5–2.0)

## 2015-07-30 LAB — CBC
HEMATOCRIT: 33.8 % — AB (ref 36.0–46.0)
HEMOGLOBIN: 10.3 g/dL — AB (ref 12.0–15.0)
MCH: 25.1 pg — AB (ref 26.0–34.0)
MCHC: 30.5 g/dL (ref 30.0–36.0)
MCV: 82.2 fL (ref 78.0–100.0)
Platelets: 138 10*3/uL — ABNORMAL LOW (ref 150–400)
RBC: 4.11 MIL/uL (ref 3.87–5.11)
RDW: 22 % — ABNORMAL HIGH (ref 11.5–15.5)
WBC: 8.4 10*3/uL (ref 4.0–10.5)

## 2015-07-30 LAB — I-STAT TROPONIN, ED: TROPONIN I, POC: 0.04 ng/mL (ref 0.00–0.08)

## 2015-07-30 LAB — CBG MONITORING, ED: GLUCOSE-CAPILLARY: 117 mg/dL — AB (ref 65–99)

## 2015-07-30 LAB — LACTIC ACID, PLASMA: LACTIC ACID, VENOUS: 1.2 mmol/L (ref 0.5–2.0)

## 2015-07-30 MED ORDER — COLESEVELAM HCL 625 MG PO TABS
1875.0000 mg | ORAL_TABLET | Freq: Two times a day (BID) | ORAL | Status: DC
  Administered 2015-07-30 – 2015-08-05 (×9): 1875 mg via ORAL
  Filled 2015-07-30 (×15): qty 3

## 2015-07-30 MED ORDER — CARVEDILOL 6.25 MG PO TABS
6.2500 mg | ORAL_TABLET | Freq: Two times a day (BID) | ORAL | Status: DC
  Administered 2015-07-30 – 2015-08-05 (×12): 6.25 mg via ORAL
  Filled 2015-07-30 (×13): qty 1

## 2015-07-30 MED ORDER — SODIUM CHLORIDE 0.9 % IV BOLUS (SEPSIS)
750.0000 mL | Freq: Once | INTRAVENOUS | Status: AC
  Administered 2015-07-30: 750 mL via INTRAVENOUS

## 2015-07-30 MED ORDER — SODIUM CHLORIDE 0.9 % IV SOLN: INTRAVENOUS | Status: DC

## 2015-07-30 MED ORDER — PANTOPRAZOLE SODIUM 40 MG PO TBEC
40.0000 mg | DELAYED_RELEASE_TABLET | Freq: Every day | ORAL | Status: DC
  Administered 2015-07-30 – 2015-08-05 (×7): 40 mg via ORAL
  Filled 2015-07-30 (×7): qty 1

## 2015-07-30 MED ORDER — SODIUM BICARBONATE 650 MG PO TABS: 650.0000 mg | ORAL_TABLET | Freq: Every day | ORAL | Status: DC

## 2015-07-30 MED ORDER — POLYETHYLENE GLYCOL 3350 17 G PO PACK
17.0000 g | PACK | Freq: Every day | ORAL | Status: DC
  Administered 2015-07-31 – 2015-08-05 (×6): 17 g via ORAL
  Filled 2015-07-30 (×7): qty 1

## 2015-07-30 MED ORDER — ACETAMINOPHEN 650 MG RE SUPP: 650.0000 mg | Freq: Four times a day (QID) | RECTAL | Status: DC | PRN

## 2015-07-30 MED ORDER — LEVOTHYROXINE SODIUM 100 MCG PO TABS
100.0000 ug | ORAL_TABLET | Freq: Every day | ORAL | Status: DC
  Administered 2015-07-30 – 2015-08-05 (×7): 100 ug via ORAL
  Filled 2015-07-30 (×8): qty 1

## 2015-07-30 MED ORDER — TICAGRELOR 90 MG PO TABS
90.0000 mg | ORAL_TABLET | Freq: Two times a day (BID) | ORAL | Status: DC
  Administered 2015-07-30 – 2015-08-05 (×13): 90 mg via ORAL
  Filled 2015-07-30 (×13): qty 1

## 2015-07-30 MED ORDER — ALLOPURINOL 100 MG PO TABS
100.0000 mg | ORAL_TABLET | Freq: Every day | ORAL | Status: DC
  Administered 2015-07-30 – 2015-08-05 (×7): 100 mg via ORAL
  Filled 2015-07-30 (×7): qty 1

## 2015-07-30 MED ORDER — HYDRALAZINE HCL 20 MG/ML IJ SOLN: 10.0000 mg | Freq: Four times a day (QID) | INTRAMUSCULAR | Status: DC | PRN

## 2015-07-30 MED ORDER — SODIUM CHLORIDE 0.45 % IV SOLN
INTRAVENOUS | Status: DC
  Administered 2015-07-30: 08:00:00 via INTRAVENOUS

## 2015-07-30 MED ORDER — ACETAMINOPHEN 325 MG PO TABS
650.0000 mg | ORAL_TABLET | Freq: Four times a day (QID) | ORAL | Status: DC | PRN
  Administered 2015-07-30 – 2015-08-04 (×5): 650 mg via ORAL
  Filled 2015-07-30 (×5): qty 2

## 2015-07-30 NOTE — ED Notes (Signed)
Attempted to contact family twice due to pt agitation and refusal to cooperate with staff to take meds and comfort care.  Pt becomes aggressive and upset when trying to work with her.

## 2015-07-30 NOTE — ED Notes (Signed)
Danford MD at bedside. 

## 2015-07-30 NOTE — ED Notes (Signed)
Pt son at bedside.

## 2015-07-30 NOTE — ED Notes (Signed)
Sat pt up to eat breakfast and attempted med pass again without any cooperation from pt

## 2015-07-30 NOTE — Progress Notes (Signed)
pleasantly demented, only oriented to self, family in room. Does has lower extremity edema, venous US pending. Repeat bmp improving, lactic acid normalized. bp elevated, start prn hydralazine. Son Fayrene Fearing in room and daughter Jones,[name] [facility]) updated over the phone [phone].

## 2015-07-30 NOTE — ED Notes (Signed)
Attempted to call both numbers listed in chart for daughter.  Both phones turned off with no way to leave messages.

## 2015-07-30 NOTE — ED Notes (Signed)
Son at bedside.

## 2015-07-30 NOTE — H&P (Signed)
History and Physical  Patient Name: [name]     WGN:[id number]    DOB: [birth year]    DOA: 07/29/2015 Referring physician: Marlon Pel, PA-C PCP: Michiel Sites, MD      Chief Complaint: Combativeness  HPI: [name] is a 82 y.o. female with a past medical history significant for dementia severe, CAD status post STEMI and PCI 3, chronic systolic CHF with EF 40%, and hypothyroidism who presents with worsening delirium.  All history is collected from the patient's children were present at the bedside as the patient is unable to provide history for herself. She had an episode of congestive heart failure in December, was diuresed and has been back home since then with family. At baseline, she ambulates with a walker, doesn't leave the house, and usually knows her family but has no memory for anything else.    2 days ago she became slightly more agitated and aggressive.  Last night she didn't sleep at all, and yesterday and today she has been hallucinating "quite a bit". Today also family noticed that she was not eating and complaining of lower abdominal discomfort and was not eating. At no point did she have a fever, cough, difficulty breathing, or appear to be working hard to breathe. She had a similar episode of erratic behavior like this 2 years ago when she had her pacemaker pocket infection, but her pacemaker appears normal at this time.  In the ED, the patient's temperature was 36C, she was hemodynamically stable, and had no leukocytosis. UA and chest x-ray were normal.  She had had slight AKI, minimally elevated lactic acid, and was combative. TRH were asked to evaluate for admission.       Review of Systems:  Unable to obtain from patient.  Per family there is abdominal pain and increased hallucinations and agitation. There is no fever, vomiting, cough, respiratory symptoms, urinary changes, chills.  Allergies  Allergen Reactions  . Statins Rash  . Tape  Rash    Please use paper tape    Prior to Admission medications   Medication Sig Start Date End Date Taking? Authorizing Provider  acetaminophen (TYLENOL) 325 MG tablet Take 650 mg by mouth 2 (two) times daily.    Historical Provider, MD  allopurinol (ZYLOPRIM) 100 MG tablet Take 100 mg by mouth daily.     Historical Provider, MD  carvedilol (COREG) 6.25 MG tablet Take 1 tablet (6.25 mg total) by mouth 2 (two) times daily with a meal. 08/15/14   Marykay Lex, MD  colesevelam [facility]) 625 MG tablet Take 1,875 mg by mouth See admin instructions. Take 3 tablets (1875 mg) with breakfast and with lunch    Historical Provider, MD  furosemide (LASIX) 40 MG tablet Take 40 mg by mouth daily.     Historical Provider, MD  levothyroxine (SYNTHROID, LEVOTHROID) 100 MCG tablet Take 100 mcg by mouth daily.     Historical Provider, MD  LORazepam (ATIVAN) 0.5 MG tablet Take 0.5 mg by mouth 3 (three) times daily.  01/16/13   Historical Provider, MD  pantoprazole (PROTONIX) 40 MG tablet Take 40 mg by mouth daily. 09/24/14   Historical Provider, MD  polyethylene glycol (MIRALAX / GLYCOLAX) packet Take 17 g by mouth daily as needed for moderate constipation.    Historical Provider, MD  polyvinyl alcohol (LIQUIFILM TEARS) 1.4 % ophthalmic solution Place 1 drop into both eyes 3 (three) times daily.    Historical Provider, MD  sodium bicarbonate 650 MG tablet Take 650  mg by mouth daily.      Historical Provider, MD  ticagrelor (BRILINTA) 90 MG TABS tablet TAKE 1 TABLET (90 MG TOTAL) BY MOUTH 2 (TWO) TIMES DAILY. Resume on 03/02/15 02/25/15   Marily Lente, NP    Past Medical History  Diagnosis Date  . ST elevation myocardial infarction (STEMI) of inferior wall (HCC) 05/2011; 03/10/2012    a) 11/'12: 100% RCA, dLM 80% -- POBA of RCA (for planned CABG, not done until re-admission with NSTEMI 1/'13);; b) 8/'13: 100% SVG-RCA (PCI & re-PCI), native RCA 100%; Patent LIMA-p-mLAD, SVG-OM  . CAD S/P percutaneous coronary  angioplasty 11/'12; 8/31 & 9/1/'13    a) MV: 100% RCA-POBA, dLM left main 80%; b) 1/'13: NSTEMI --> CABG; c) 2013: 8/31 - 100% RCA & acute SVG-RCA, 100% SVG-OM, 90% LM, 80% p&mLAD, ~70% RI, 50% Cx --> PCI-SVG-RCA: Promus Element DES x 3 (prox 2.5 mm x 38 mm & 2.5 mm x 16 mm, distal 2.5 mm x 16 mm); on 9/1 - accute in-stent Thrombosis - Aspiration thrombectomy & PTCA  . S/P CABG x 4 08/05/11    Dr. Tyrone Sage: LIMA-p-mLAD, SVG-RPDA, SVG-OM; known  100% SVG-OM, Extensive PTCA of SVG-RCA;;  Hospital course complicated by Afib & PNA; after d/c cellulitis of SVG harvest site due to edema  . Episodic atrial fibrillation (HCC) 07/2011    Post-Op CABG  . Ischemic cardiomyopathy 03/2012    Echo: EF 45-50% - Mild basal- mid inferolateral Hypokinesis: Gr 1 DD, mildly increased PAP.  Marland Kitchen Dyslipidemia, goal LDL below 70     statin intolerance (lipitor, crestor drug reaction); Welchol & Zetia  . Hypertension, essential   . DM (diabetes mellitus), type 2 with renal complications (HCC)   . Chronic kidney disease (CKD) stage G3b/A1, moderately decreased glomerular filtration rate (GFR) between 30-44 mL/min/1.73 square meter and albuminuria creatinine ratio less than 30 mg/g   . History of pneumonia     post-op from CABG  . Hypothyroidism     on synthroid  . Headache(784.0)   . Osteoarthritis   . Anxiety     PRN Xanax   . Dermatophytosis of nail   . Gout     on Allopurinol  . Lower extremity edema     chronic  . GERD (gastroesophageal reflux disease) 04/26/2015  . Acute CHF (congestive heart failure) (HCC) 06/24/2015    Past Surgical History  Procedure Laterality Date  . Cardiac catheterization  November 2012; August and September 2013    Patent LIMA-LAD, patent she had an OM, patent stented SVG-RCA; occluded native RCA, 90% ostial left main, tandem 80% proximal and mid LAD, 70% mid Circumflex  . Coronary angioplasty  November 2012    PTCA only of RCA and setting of inferior STEMI  . Coronary artery  bypass graft  08/05/2011    Procedure: CORONARY ARTERY BYPASS GRAFTING (CABG);  Surgeon: [name], MD;  Location: [facility] OR;  Service: Open Heart Surgery;  Laterality: N/A;  coronary artery bypass graft times 4 using left internal mammary artery and right leg saphenous vein harvested endoscopically  . Joint replacement      both hips replaced  . I&d extremity  09/21/2011    Procedure: IRRIGATION AND DEBRIDEMENT EXTREMITY;  Surgeon: [name], MD;  Location: [facility] OR;  Service: Vascular;  Laterality: Right;  with wound vac placement  . Coronary angioplasty with stent placement  03/10/2012     infferior STEMI: Occluded native RCA and SVG-RCA --> thrombectomy and 3 stent placement  to the SVG-RCA (2.5 mm right 38 mm and 2 proximal distal overlapping 2.5 mm x 16 mm Promus DES stents:  . Coronary angioplasty  03/11/2012    Inferior STEMI #3: Reoccluded SVG-RCA; extensive thrombectomy and post dilation PTCA  . Transthoracic echocardiogram  September 2013    EF 45-50%, mild hypokinesis of the basal and mid inferolateral wall, grade 1 diastolic dysfunction, mildly increased artery pressures.  . Left heart catheterization with coronary angiogram N/A 05/26/2011    Procedure: LEFT HEART CATHETERIZATION WITH CORONARY ANGIOGRAM;  Surgeon: Marykay Lex, MD;  Location: [facility] CATH LAB;  Service: Cardiovascular;  Laterality: N/A;  . Percutaneous coronary intervention-balloon only N/A 05/26/2011    Procedure: PERCUTANEOUS CORONARY INTERVENTION-BALLOON ONLY;  Surgeon: Marykay Lex, MD;  Location: [facility] CATH LAB;  Service: Cardiovascular;  Laterality: N/A;  . Left heart cath N/A 03/10/2012    Procedure: LEFT HEART CATH;  Surgeon: Herby Abraham, MD;  Location: [facility] CATH LAB;  Service: Cardiovascular;  Laterality: N/A;  . Percutaneous coronary stent intervention (pci-s)  03/10/2012    Procedure: PERCUTANEOUS CORONARY STENT INTERVENTION (PCI-S);  Surgeon: Herby Abraham, MD;  Location: [facility] CATH LAB;  Service:  Cardiovascular;;  . Left heart catheterization with coronary angiogram Bilateral 03/11/2012    Procedure: LEFT HEART CATHETERIZATION WITH CORONARY ANGIOGRAM;  Surgeon: Thurmon Fair, MD;  Location: [facility] CATH LAB;  Service: Cardiovascular;  Laterality: Bilateral;  . Ep implantable device N/A 02/24/2015    Procedure: Pacemaker Implant;  Surgeon: Thurmon Fair, MD;  Location: [facility] INVASIVE CV LAB;  Service: Cardiovascular;  Laterality: N/A;  . Ep implantable device N/A 04/17/2015    Procedure: Lead Extraction, Can Extraction;  Surgeon: Marinus Maw, MD;  Location: [facility] INVASIVE CV LAB;  Service: Cardiovascular;  Laterality: N/A;  . Cardiac catheterization N/A 04/17/2015    Procedure: Temporary Pacemaker;  Surgeon: Marinus Maw, MD;  Location: [facility] INVASIVE CV LAB;  Service: Cardiovascular;  Laterality: N/A;  . Ep implantable device N/A 04/21/2015    Procedure: Pacemaker Implant;  Surgeon: Marinus Maw, MD;  Location: [facility] INVASIVE CV LAB;  Service: Cardiovascular;  Laterality: N/A;  . Pacemaker lead removal  04/21/2015    Procedure: Pacemaker Lead Removal;  Surgeon: Marinus Maw, MD;  Location: [facility] INVASIVE CV LAB;  Service: Cardiovascular;;  LV     Family history: family history includes Breast cancer in her mother; Heart disease in her father.  Social History: Patient lives by herself.  Her husband passed away 1 month ago.  She requires 24/7 care; she obtains this from hired caregivers and family. She walks with a walker. She is not nor ever was a smoker.       Physical Exam: BP 113/63 mmHg  Pulse 94  Temp(Src) 96.8 F (36 C) (Rectal)  Resp 14  SpO2 96% General appearance: Thin adult female, alert and sitting quietly but suspicious of this examiner and not cooperative. Head: There is bruising over the left eyebrow and cheek. Eyes: Anicteric, conjunctiva pink, lids and lashes normal.     ENT: No nasal deformity, discharge, or epistaxis.  Will not open mouth.   Lymph: There is a small right  neck lump, without erythema. Skin: Warm and dry.   Cardiac: Tachycardic, regular, nl S1-S2, no murmurs appreciated.  Mild non-pitting LE edema bilateral.  Radial pulses 2+ and symmetric. Respiratory: Normal respiratory rate and rhythm.  CTAB in anterior fields, patient otherwise not cooperative with exam.   Abdomen: Abdomen soft without rigidity.  Mild TTP, non-focal.  No ascites, distension.   Neuro: Patient quiet, does not cooperate with exam.  Appears to move UE equally.     Psych: Behavior guarded.         Labs on Admission:  The metabolic panel shows hyponatremia, normal potassium, slightly low bicarbonate. The serum creatinine is 2.14 mg/dL from a baseline of 9.6-0.4 mg/dL. The BUN to creatinine ratio is elevated. The glucose is normal. Alkaline phosphatase level is slightly elevated above the upper limit of normal, transaminases and bilirubin are normal. The urinalysis is completely clear. Lactic acid level is 2.3 mmol per liter. The troponin negative. The complete blood count shows no leukocytosis or thrombocytopenia. There is a chronic normocytic anemia that is stable..   Radiological Exams on Admission: Personally reviewed: Dg Chest Port 1 View 07/30/2015   Clear.  CM.   Ct Head Wo Contrast 07/30/2015   NAICP    EKG: Independently reviewed. Non-specific IVCD.  Rate 89.  QTc 530.      Assessment/Plan 1. AMS:  This is new.  Patient with dementia at baseline.  Contributory factors likely are dehydration, hypernatremia, AKI, constipation.  UTI ruled out.  Sepsis doubted.   -CT head to rule out SDH given bruising on head from 2 weeks ago, age and Brilinta -Follow blood cultures -Fluid resuscitation to treat AKI -Treat constipation    2. Hypernatremia:  This is new.   -Discontinue sodium bicarbonate tablets -Fluid resuscitation with 1/2 NS at 75 cc/hr -Repeat BMP at 1400  3. Elevated lactic acid:  Sepsis is doubted, given no source of infection, no fever  (rectal temp within range 36-38C), and no leukocytosis.  Suspect dehydration -Trend lactic acid after fluids  4. AKI:  Mild.  Suspect this is functional from dehydration. -Hold furosemide -Trend BMP  5. Elevated alkaline phosphatase:  Unclear etiology.  Has been trending up over last month.  6. Anemia, chronic renal disease:  Stable.   7. History of Gout:  Stable.  -Continue allopurinol  8. Hypothyroidism: -Continue levothyroxine  9. HTN and hyperlipidemia and hx of CAD s/p PCI x3 and chronic combined systolic and diastolic CHF: -Continue BB and Welchol and Brilinta given normal CT head -Hold furosemide for now, given #2 and 4 above.  10. NIDDM: Normoglycemic without antidiabetic medicines.  11. Abdominal pain: Exam benign and constipation seems more likely. -Miralax daily -Suppository as needed   DVT PPx: SCDs Diet: Dysphagia 3 Consultants: PT/OT Code Status: DO NOT RESUSCITATE Family Communication: Son and daughter, present at bedside.  Differential and overnight plan to do CT head and treat with fluids and for constipation reviewed.  All questions answered.  CODE STATUS confirmed.  Medical decision making: What exists of the patient's previous chart was reviewed in depth and the case was discussed with Marlon Pel PA-C. Patient seen 2:05 AM on 07/30/2015.  Disposition Plan:  Admit to observation status for AKI and hypernatremia and altered mental status.      Alberteen Sam [facility] Pager [phone]

## 2015-07-30 NOTE — ED Notes (Addendum)
Pt refusing to eat breakfast or take oral meds.  Will try again when son arrives.

## 2015-07-30 NOTE — ED Notes (Signed)
Pryor Ochoa daughter [facility]  Home [phone] Cell [phone]

## 2015-07-30 NOTE — ED Notes (Signed)
Requested meds from pharmacy  

## 2015-07-31 ENCOUNTER — Ambulatory Visit (HOSPITAL_COMMUNITY): Payer: Medicare Other

## 2015-07-31 ENCOUNTER — Encounter (HOSPITAL_COMMUNITY): Payer: Self-pay | Admitting: General Practice

## 2015-07-31 DIAGNOSIS — E860 Dehydration: Secondary | ICD-10-CM

## 2015-07-31 DIAGNOSIS — R609 Edema, unspecified: Secondary | ICD-10-CM

## 2015-07-31 LAB — CBC
HCT: 26.2 % — ABNORMAL LOW (ref 36.0–46.0)
HEMOGLOBIN: 8.2 g/dL — AB (ref 12.0–15.0)
MCH: 25.5 pg — AB (ref 26.0–34.0)
MCHC: 31.3 g/dL (ref 30.0–36.0)
MCV: 81.6 fL (ref 78.0–100.0)
PLATELETS: 117 10*3/uL — AB (ref 150–400)
RBC: 3.21 MIL/uL — ABNORMAL LOW (ref 3.87–5.11)
RDW: 22.1 % — ABNORMAL HIGH (ref 11.5–15.5)
WBC: 5 10*3/uL (ref 4.0–10.5)

## 2015-07-31 LAB — BASIC METABOLIC PANEL
ANION GAP: 6 (ref 5–15)
BUN: 35 mg/dL — ABNORMAL HIGH (ref 6–20)
CALCIUM: 9.1 mg/dL (ref 8.9–10.3)
CO2: 22 mmol/L (ref 22–32)
CREATININE: 1.78 mg/dL — AB (ref 0.44–1.00)
Chloride: 117 mmol/L — ABNORMAL HIGH (ref 101–111)
GFR calc Af Amer: 27 mL/min — ABNORMAL LOW (ref >60)
GFR, EST NON AFRICAN AMERICAN: 24 mL/min — AB (ref >60)
GLUCOSE: 72 mg/dL (ref 65–99)
POTASSIUM: 3.9 mmol/L (ref 3.5–5.1)
Sodium: 145 mmol/L (ref 135–145)

## 2015-07-31 MED ORDER — SODIUM CHLORIDE 0.9 % IV SOLN
INTRAVENOUS | Status: DC
Start: 2015-07-31 — End: 2015-08-01
  Administered 2015-07-31 – 2015-08-01 (×2): via INTRAVENOUS

## 2015-07-31 NOTE — Evaluation (Signed)
Physical Therapy Evaluation Patient Details Name: [name] MRN: [id number] DOB: 28-Apr-1921 Today's Date: 07/31/2015   History of Present Illness  82 y.o. female admitted to [facility] on 07/29/15 for increased confusion acute encephalopathy possibly triggered by dehydration, hypernatremia, acute kidney injury, and constipation.  Pt with significant PMHx of STEMI, s/p CABG, A-fib, ischemic cardiomyopathy, DM, HTN, gout, LE edema, CHF, delirium, pacemaker, bil THA, and multiple heart catheterizations.  Clinical Impression  Pt is weak and has been sleeping most of today per RN. Per sons' report, she has not slept much in the last 48 hours. Pt is weak and shaky on her feet even with just transfer to recliner chair with RW from the bed.  She would be best served for SNF placement for rehab before returning home.  Family is in agreement with this plan.   PT to follow acutely for deficits listed below.       Follow Up Recommendations SNF    Equipment Recommendations  Wheelchair (measurements PT);Wheelchair cushion (measurements PT) (WC vs transport chair)    Recommendations for Other Services   NA    Precautions / Restrictions Precautions Precautions: Fall Precaution Comments: due to weakness and confusion.  Restrictions Weight Bearing Restrictions: No      Mobility  Bed Mobility Overal bed mobility: Needs Assistance Bed Mobility: Supine to Sit     Supine to sit: Min assist;HOB elevated     General bed mobility comments: Min assist to support trunk during transition to sitting.  Heavy use of bed rail and HOB elevated ~30 degrees  Transfers Overall transfer level: Needs assistance Equipment used: Rolling walker (2 wheeled) Transfers: Sit to/from [employer] Sit to Stand: Min assist Stand pivot transfers: Min assist       General transfer comment: Min assist to support trunk over weak and shaky legs. Some mild buckling noted with just tranfer to chair.     Ambulation/Gait             General Gait Details: would be safer at this time with second person assist.  Pt was only agreeable to OOB to chair because "the doctor told me to rest"      Balance Overall balance assessment: Needs assistance Sitting-balance support: Feet supported;Bilateral upper extremity supported Sitting balance-Leahy Scale: Fair     Standing balance support: Bilateral upper extremity supported Standing balance-Leahy Scale: Poor                               Pertinent Vitals/Pain Pain Assessment: No/denies pain    Home Living Family/patient expects to be discharged to:: Private residence Living Arrangements: Alone;Other (Comment) Available Help at Discharge: Family;Personal care attendant;Available 24 hours/day (family there when aid is not.  They take shifts) Type of Home: House Home Access: Ramped entrance     Home Layout: One level Home Equipment: Walker - 2 wheels;Cane - single point;Wheelchair - manual;Bedside commode;Grab bars - tub/shower (per son's report WC is old and broken down)      Prior Function Level of Independence: Needs assistance   Gait / Transfers Assistance Needed: Uses RW for household ambulation. Sons and aide have been more closely supervising ambulation with RW lately because she has been falling.            Hand Dominance   Dominant Hand: Right    Extremity/Trunk Assessment   Upper Extremity Assessment: Defer to OT evaluation  Lower Extremity Assessment: Generalized weakness      Cervical / Trunk Assessment: Kyphotic  Communication   Communication: No difficulties  Cognition Arousal/Alertness: Lethargic (just woke up from sleeping) Behavior During Therapy: Flat affect Overall Cognitive Status: Impaired/Different from baseline Area of Impairment:  (per family increased confusion)                             Assessment/Plan    PT Assessment Patient needs continued  PT services  PT Diagnosis Difficulty walking;Abnormality of gait;Generalized weakness;Altered mental status   PT Problem List Decreased strength;Decreased activity tolerance;Decreased balance;Decreased mobility;Decreased cognition;Decreased knowledge of use of DME  PT Treatment Interventions DME instruction;Gait training;Functional mobility training;Therapeutic activities;Therapeutic exercise;Balance training;Neuromuscular re-education;Cognitive remediation;Patient/family education   PT Goals (Current goals can be found in the Care Plan section) Acute Rehab PT Goals Patient Stated Goal: unable to state, family would like her to go to SNF for rehab.  PT Goal Formulation: With family Time For Goal Achievement: 08/14/15 Potential to Achieve Goals: Fair    Frequency Min 2X/week   Barriers to discharge Decreased caregiver support family is no longer able to provide around the clock shifts due to their own health issues, and one son's job Software engineer.        End of Session Equipment Utilized During Treatment: Gait belt Activity Tolerance: Patient limited by fatigue Patient left: in chair;with call bell/phone within reach;with chair alarm set Nurse Communication: Mobility status         Time: 1610-9604 PT Time Calculation (min) (ACUTE ONLY): 33 min   Charges:   PT Evaluation $PT Eval Moderate Complexity: 1 Procedure PT Treatments $Self Care/Home Management: 8-22        Aster Eckrich B. Athanasia Stanwood, PT, DPT [phone]   07/31/2015, 5:49 PM

## 2015-07-31 NOTE — Progress Notes (Signed)
OT Cancellation Note  Patient Details Name: [name] MRN: [id number] DOB: [birth year]   Cancelled Treatment:    Reason Eval/Treat Not Completed: Patient at procedure or test/ unavailable At vascular lab   Felecia Shelling   OTR/L Pager: [phone] Office: [phone] .  07/31/2015, 11:34 AM

## 2015-07-31 NOTE — Care Management Note (Signed)
Case Management Note  Patient Details  Name: [name] MRN: [id number] Date of Birth: March 16, 1921  Subjective/Objective:                    Action/Plan:  Initial UR completed. Await PT/OT evals  Expected Discharge Date:                  Expected Discharge Plan:     In-House Referral:     Discharge planning Services     Post Acute Care Choice:    Choice offered to:     DME Arranged:    DME Agency:     HH Arranged:    HH Agency:     Status of Service:  In process, will continue to follow  Medicare Important Message Given:    Date Medicare IM Given:    Medicare IM give by:    Date Additional Medicare IM Given:    Additional Medicare Important Message give by:     If discussed at Long Length of Stay Meetings, dates discussed:    Additional Comments:  Kingsley Plan, RN 07/31/2015, 7:58 AM

## 2015-07-31 NOTE — Progress Notes (Addendum)
[facility] PROGRESS NOTE  ODALYS WIN [name]:[id number] DOB: 09-03-1920 DOA: 07/29/2015 PCP: Michiel Sites, MD  Brief narrative 82 year old female with history of severe dementia, CAD status post ST EMI with PCI, chronic systolic CHF (last EF of 50-55%) hypothyroidism presenting with worsening delirium for the past 1 month. At baseline she is homebound and uses walker to ambulate. Family noticed that she has not been herself for almost a month and since 2 days prior to admission she has been increasingly more agitated and aggressive as well as hallucinating. No history of fevers, chills, nausea, vomiting, abdominal pain, bowel or urinary symptoms.  In the ED vitals were stable and no signs of infection. She had hypernatremia with with  acute on chronic kidney injury and elevated lactic acid and was combative. Admitted for further management.    Assessment/Plan: Acute encephalopathy Possibly triggered by dehydration, hypernatremia, acute kidney injury and constipation. No signs of infection. CT head unremarkable. Recent TSH normal. Check B12.  Cultures negative. Continue IV hydration. Sodium improved. AKI improving. As per son patient's delirium is slightly better since admission.  Acute on chronic kidney injury Secondary to dehydration. Holding Lasix. Continue gentle hydration.  Hypernatremia Resolved with IV hydration.  Elevated lactic acid.  Possibly due to dehydration. Resolved with fluids  History of CAD with combined systolic and diastolic CHF Continue beta blocker and WelChol. Continue present. Lasix on hold. Hypovolemic and getting gentle hydration. Last EF of 50-percent.  History of diabetes mellitus Not on any medications.  Anemia Baseline hemoglobin noted to be around 8.5 9.5. Check stool for blood. Monitor closely.   History of gout Stable. Continue allopurinol  Hypothyroidism Continue Synthroid.  DVT prophylaxis: SCDs Diet: Regular  Code  Status: DO NOT RESUSCITATE Family Communication: Spoke with son Baldo Ash on the phone. He is interested in placing her to skilled nursing facility. PT consulted. Disposition Plan: Pending PT evaluation.   Consultants:  None  Procedures:  Head CT  Doppler lower extremity  Antibiotics:  None  HPI/Subjective: Seen and examined. Overnight issues. Patient is poorly communicative  Objective: Filed Vitals:   07/31/15 0440 07/31/15 1345  BP:  101/46  Pulse: 69 71  Temp:  98 F (36.7 C)  Resp:  18    Intake/Output Summary (Last 24 hours) at 07/31/15 1622 Last data filed at 07/31/15 1347  Gross per 24 hour  Intake 1723.75 ml  Output      0 ml  Net 1723.75 ml   There were no vitals filed for this visit.  Exam:   General:  Elderly female not in distress  HEENT: No pallor, moist mucosa  Chest: Clear bilaterally  CVS: Normal S1 and S2, no murmurs  GI: Soft, nondistended, nontender, bowel sounds present  Musculoskeletal: Warm, no edema  CNS: Awake and alert, not oriented    Data Reviewed: Basic Metabolic Panel:  Recent Labs Lab 07/29/15 2235 07/30/15 0354 07/30/15 1524 07/31/15 0515  NA 145 149* 145 145  K 5.0 5.0 4.7 3.9  CL 109 113* 113* 117*  CO2 21* 20* 20* 22  GLUCOSE 120* 107* 130* 72  BUN 39* 38* 36* 35*  CREATININE 2.14* 1.97* 1.77* 1.78*  CALCIUM 10.3 9.7 9.3 9.1   Liver Function Tests:  Recent Labs Lab 07/29/15 2235 07/30/15 0354  AST 18 13*  ALT 10* 8*  ALKPHOS 151* 122  BILITOT 0.8 0.7  PROT 7.0 6.0*  ALBUMIN 3.4* 2.9*   No results for input(s): LIPASE, AMYLASE in the last 168 hours. No results  for input(s): AMMONIA in the last 168 hours. CBC:  Recent Labs Lab 07/29/15 2235 07/30/15 0354 07/31/15 0515  WBC 8.2 8.4 5.0  NEUTROABS 6.8  --   --   HGB 11.4* 10.3* 8.2*  HCT 36.7 33.8* 26.2*  MCV 81.9 82.2 81.6  PLT 155 138* 117*   Cardiac Enzymes: No results for input(s): CKTOTAL, CKMB, CKMBINDEX, TROPONINI in the last  168 hours. BNP (last 3 results)  Recent Labs  03/01/15 1849 06/24/15 1908  BNP 224.5* 841.3*    ProBNP (last 3 results) No results for input(s): PROBNP in the last 8760 hours.  CBG:  Recent Labs Lab 07/29/15 2224 07/30/15 0047  GLUCAP 116* 117*    Recent Results (from the past 240 hour(s))  Culture, blood (Routine X 2) w Reflex to ID Panel     Status: None (Preliminary result)   Collection Time: 07/30/15  1:10 AM  Result Value Ref Range Status   Specimen Description BLOOD LEFT ARM  Final   Special Requests IN PEDIATRIC BOTTLE  Final   Culture NO GROWTH 1 DAY  Final   Report Status PENDING  Incomplete  Culture, blood (Routine X 2) w Reflex to ID Panel     Status: None (Preliminary result)   Collection Time: 07/30/15  1:15 AM  Result Value Ref Range Status   Specimen Description BLOOD LEFT HAND  Final   Special Requests IN PEDIATRIC BOTTLE  Final   Culture NO GROWTH 1 DAY  Final   Report Status PENDING  Incomplete     Studies: Ct Head Wo Contrast  07/30/2015  CLINICAL DATA:  Altered mental status, lower abdominal pain, with anorexia. Head trauma. History of hypertension, hyperlipidemia, diabetes, chronic kidney disease. EXAM: CT HEAD WITHOUT CONTRAST TECHNIQUE: Contiguous axial images were obtained from the base of the skull through the vertex without intravenous contrast. COMPARISON:  CT head March 01, 2015 FINDINGS: Mildly motion degraded examination. The ventricles and sulci are normal for age. No intraparenchymal hemorrhage, mass effect nor midline shift. Patchy supratentorial white matter hypodensities are less than expected for patient's age and though non-specific suggest sequelae of chronic small vessel ischemic disease. No acute large vascular territory infarcts. No abnormal extra-axial fluid collections. Basal cisterns are patent. Moderate calcific atherosclerosis of the carotid siphons. No skull fracture. The included ocular globes and orbital contents are  non-suspicious. Soft tissue partially opacifies the included RIGHT maxillary sinus. The mastoid air cells are well aerated. Severe LEFT temporomandibular osteoarthrosis. IMPRESSION: Negative CT head for age. Electronically Signed   By: Awilda Metro M.D.   On: 07/30/2015 03:04   Dg Chest Port 1 View  07/30/2015  CLINICAL DATA:  82 year old female with altered mental status EXAM: PORTABLE CHEST 1 VIEW COMPARISON:  Radiograph dated 06/24/2015 FINDINGS: Single-view of the chest demonstrates mild central vascular prominence likely a degree of congestive changes. No focal consolidation, pleural effusion, or pneumothorax. Stable cardiomegaly. Right pectoral pacemaker device. No acute osseous pathology. IMPRESSION: Stable cardiomegaly with mild congestive changes. No focal consolidation. Electronically Signed   By: Elgie Collard M.D.   On: 07/30/2015 00:24    Scheduled Meds: . allopurinol  100 mg Oral Daily  . carvedilol  6.25 mg Oral BID WC  . colesevelam  1,875 mg Oral BID WC  . levothyroxine  100 mcg Oral QAC breakfast  . pantoprazole  40 mg Oral Daily  . polyethylene glycol  17 g Oral Daily  . ticagrelor  90 mg Oral BID   Continuous Infusions: .  sodium chloride        Time spent: 25 minutes    Jabbar Palmero  [facility] Pager [phone]. If 7PM-7AM, please contact night-coverage at [email or web address], password [facility] 07/31/2015, 4:22 PM  LOS: 1 day

## 2015-07-31 NOTE — Progress Notes (Addendum)
*  Preliminary Results* Bilateral lower extremity venous duplex completed. Study was technically limited and difficult due to edema and patient position. Visualized veins of bilateral lower extremities are negative for deep vein thrombosis. There is no evidence of Baker's cyst bilaterally.  Incidental finding: There is a mostly anechoic area of the left distal popliteal fossa/ proximal medial calf area with some internal echoes measuring 1.17cm in its largest diameter. This is suggestive of a possible cyst versus unknown etiology.  07/31/2015  Gertie Fey, RVT, RDCS, RDMS

## 2015-07-31 NOTE — Clinical Documentation Improvement (Signed)
Internal Medicine  Can the diagnosis of anemia be further specified?   Iron deficiency Anemia  Nutritional anemia, including the nutrition or mineral deficits  Chronic Anemia, including the suspected or known cause  Anemia of chronic disease, including the associated chronic disease state  Other  Clinically Undetermined  Document any associated diagnoses/conditions. CKD 3,   Supporting Information: H&P 07/30/15: Anemia, chronic renal disease Component     Latest Ref Rng 07/29/2015 07/30/2015 07/31/2015            Hemoglobin     12.0 - 15.0 g/dL 56.2 (L) 13.0 (L) 8.2 (L)  HCT     36.0 - 46.0 % 36.7 33.8 (L) 26.2 (L)     Please exercise your independent, professional judgment when responding. A specific answer is not anticipated or expected. Please update your documentation within the medical record to reflect your response to this query. Thank you  Thank Barrie Dunker Health Information Management [place] [phone]

## 2015-08-01 LAB — SODIUM
Sodium: 143 mmol/L (ref 135–145)
Sodium: 143 mmol/L (ref 135–145)
Sodium: 143 mmol/L (ref 135–145)

## 2015-08-01 LAB — BASIC METABOLIC PANEL
Anion gap: 6 (ref 5–15)
Anion gap: 11 (ref 5–15)
BUN: 30 mg/dL — AB (ref 6–20)
BUN: 27 mg/dL — ABNORMAL HIGH (ref 6–20)
CALCIUM: 9.1 mg/dL (ref 8.9–10.3)
CHLORIDE: 117 mmol/L — AB (ref 101–111)
CHLORIDE: 114 mmol/L — AB (ref 101–111)
CO2: 23 mmol/L (ref 22–32)
CO2: 21 mmol/L — AB (ref 22–32)
CREATININE: 1.73 mg/dL — AB (ref 0.44–1.00)
CREATININE: 1.68 mg/dL — AB (ref 0.44–1.00)
Calcium: 9.1 mg/dL (ref 8.9–10.3)
GFR calc Af Amer: 28 mL/min — ABNORMAL LOW (ref >60)
GFR calc Af Amer: 29 mL/min — ABNORMAL LOW (ref >60)
GFR calc non Af Amer: 24 mL/min — ABNORMAL LOW (ref >60)
GFR calc non Af Amer: 25 mL/min — ABNORMAL LOW (ref >60)
GLUCOSE: 78 mg/dL (ref 65–99)
GLUCOSE: 103 mg/dL — AB (ref 65–99)
Potassium: 4 mmol/L (ref 3.5–5.1)
Potassium: 4.2 mmol/L (ref 3.5–5.1)
SODIUM: 146 mmol/L — AB (ref 135–145)
Sodium: 146 mmol/L — ABNORMAL HIGH (ref 135–145)

## 2015-08-01 LAB — VITAMIN B12: Vitamin B-12: 731 pg/mL (ref 180–914)

## 2015-08-01 LAB — CBC
HEMATOCRIT: 26.5 % — AB (ref 36.0–46.0)
HEMOGLOBIN: 8.5 g/dL — AB (ref 12.0–15.0)
MCH: 25.9 pg — AB (ref 26.0–34.0)
MCHC: 32.1 g/dL (ref 30.0–36.0)
MCV: 80.8 fL (ref 78.0–100.0)
Platelets: 142 10*3/uL — ABNORMAL LOW (ref 150–400)
RBC: 3.28 MIL/uL — ABNORMAL LOW (ref 3.87–5.11)
RDW: 22 % — AB (ref 11.5–15.5)
WBC: 4.6 10*3/uL (ref 4.0–10.5)

## 2015-08-01 MED ORDER — SODIUM CHLORIDE 0.45 % IV SOLN
INTRAVENOUS | Status: DC
  Administered 2015-08-01 – 2015-08-02 (×2): via INTRAVENOUS

## 2015-08-01 NOTE — Progress Notes (Signed)
[facility] Progress Note  Patient: [name] ZOX:[id number]   PCP: Michiel Sites, MD DOB: 1920/09/19   DOA: 07/29/2015   DOS: 08/01/2015   Date of Service: the patient was seen and examined on 08/01/2015  Subjective: Patient continues to remain confused. Did complain of some chest pain earlier in the morning. Currently pain free. No shortness of breath. Does with that she is in [place] but does not think that this is [place].  Nutrition: Able to tolerate oral diet Activity: Mostly bedridden Last BM: 07/30/2015  Assessment and Plan: 1. Altered mental status Hypernatremia. Dehydration. No sign of evidence of infection, CT scan of the head is unremarkable, TSH normal, B-12 and also stable. Restart half normal sodium. Rechecks BMP. We will continue close monitoring. Her delirium or acute encephalopathy can also be secondary to progressive dementia.  2. Acute on chronic kidney injury. Improving with hydration. Currently euvolemic.  3. Diabetes mellitus history. Stable continue monitoring.  4. Coronary artery disease with chronic combined CHF. Continue beta blocker, continue aspirin. Holding Lasix.  5. Thyroidism. Continue Synthroid.  6. Anemia of chronic disease. H&H remained stable continue monitoring.   DVT Prophylaxis: subcutaneous Heparin Nutrition: Cardiac diet Advance goals of care discussion: DNR/DNI  Brief Summary of Hospitalization:  HPI: As per the H and P dictated on admission, "[name] is a 82 y.o. female with a past medical history significant for dementia severe, CAD status post STEMI and PCI 3, chronic systolic CHF with EF 40%, and hypothyroidism who presents with worsening delirium.  All history is collected from the patient's children were present at the bedside as the patient is unable to provide history for herself. She had an episode of congestive heart failure in December, was diuresed and has been back home since then  with family. At baseline, she ambulates with a walker, doesn't leave the house, and usually knows her family but has no memory for anything else.   2 days ago she became slightly more agitated and aggressive. Last night she didn't sleep at all, and yesterday and today she has been hallucinating "quite a bit". Today also family noticed that she was not eating and complaining of lower abdominal discomfort and was not eating. At no point did she have a fever, cough, difficulty breathing, or appear to be working hard to breathe. She had a similar episode of erratic behavior like this 2 years ago when she had her pacemaker pocket infection, but her pacemaker appears normal at this time.  In the ED, the patient's temperature was 36C, she was hemodynamically stable, and had no leukocytosis. UA and chest x-ray were normal. She had had slight AKI, minimally elevated lactic acid, and was combative. TR[facility] were asked to evaluate for admission." Daily update, Procedures: Sodium level elevated again 08/01/2015 Consultants: None Antibiotics: Anti-infectives    None       Family Communication: no family was present at bedside, at the time of interview.   Disposition:  Expected discharge date:08/03/2015 Barriers to safe discharge: Improvement in mentation as well as sodium level   Intake/Output Summary (Last 24 hours) at 08/01/15 1119 Last data filed at 07/31/15 2141  Gross per 24 hour  Intake 1003.75 ml  Output    200 ml  Net 803.75 ml   There were no vitals filed for this visit.  Objective: Physical Exam: Filed Vitals:   07/31/15 1345 07/31/15 2143 08/01/15 0546 08/01/15 0835  BP: 101/46 131/52 126/69 134/75  Pulse: 71 70 61 64  Temp: 98 F (36.7 C) 97.5 F (36.4 C) 97.7 F (36.5 C)   TempSrc: Oral Oral Oral   Resp: 18 18 18 20   SpO2: 93% 98% 98% 99%     General: Appear in mild distress, no Rash; Oral Mucosa moist. Cardiovascular: S1 and S2 Present, no Murmur, no JVD Respiratory:  Bilateral Air entry present and Clear to Auscultation, no Crackles, no wheezes Abdomen: Bowel Sound present, Soft and no tenderness Extremities: no Pedal edema, no calf tenderness Neurology: Grossly no focal neuro deficit. Appears confused and disoriented  Data Reviewed: CBC:  Recent Labs Lab 07/29/15 2235 07/30/15 0354 07/31/15 0515 08/01/15 0602  WBC 8.2 8.4 5.0 4.6  NEUTROABS 6.8  --   --   --   HGB 11.4* 10.3* 8.2* 8.5*  HCT 36.7 33.8* 26.2* 26.5*  MCV 81.9 82.2 81.6 80.8  PLT 155 138* 117* 142*   Basic Metabolic Panel:  Recent Labs Lab 07/29/15 2235 07/30/15 0354 07/30/15 1524 07/31/15 0515 08/01/15 0602  NA 145 149* 145 145 146*  K 5.0 5.0 4.7 3.9 4.0  CL 109 113* 113* 117* 117*  CO2 21* 20* 20* 22 23  GLUCOSE 120* 107* 130* 72 78  BUN 39* 38* 36* 35* 30*  CREATININE 2.14* 1.97* 1.77* 1.78* 1.73*  CALCIUM 10.3 9.7 9.3 9.1 9.1   Liver Function Tests:  Recent Labs Lab 07/29/15 2235 07/30/15 0354  AST 18 13*  ALT 10* 8*  ALKPHOS 151* 122  BILITOT 0.8 0.7  PROT 7.0 6.0*  ALBUMIN 3.4* 2.9*   No results for input(s): LIPASE, AMYLASE in the last 168 hours. No results for input(s): AMMONIA in the last 168 hours.  Cardiac Enzymes: No results for input(s): CKTOTAL, CKMB, CKMBINDEX, TROPONINI in the last 168 hours.  BNP (last 3 results)  Recent Labs  03/01/15 1849 06/24/15 1908  BNP 224.5* 841.3*    CBG:  Recent Labs Lab 07/29/15 2224 07/30/15 0047  GLUCAP 116* 117*    Recent Results (from the past 240 hour(s))  Culture, blood (Routine X 2) w Reflex to ID Panel     Status: None (Preliminary result)   Collection Time: 07/30/15  1:10 AM  Result Value Ref Range Status   Specimen Description BLOOD LEFT ARM  Final   Special Requests IN PEDIATRIC BOTTLE  Final   Culture NO GROWTH 1 DAY  Final   Report Status PENDING  Incomplete  Culture, blood (Routine X 2) w Reflex to ID Panel     Status: None (Preliminary result)   Collection Time:  07/30/15  1:15 AM  Result Value Ref Range Status   Specimen Description BLOOD LEFT HAND  Final   Special Requests IN PEDIATRIC BOTTLE  Final   Culture NO GROWTH 1 DAY  Final   Report Status PENDING  Incomplete     Studies: No results found.   Scheduled Meds: . allopurinol  100 mg Oral Daily  . carvedilol  6.25 mg Oral BID WC  . colesevelam  1,875 mg Oral BID WC  . levothyroxine  100 mcg Oral QAC breakfast  . pantoprazole  40 mg Oral Daily  . polyethylene glycol  17 g Oral Daily  . ticagrelor  90 mg Oral BID   Continuous Infusions: . sodium chloride     PRN Meds: acetaminophen **OR** acetaminophen, hydrALAZINE  Time spent: 30 minutes  Author: Lynden Oxford, MD [facility] Pager: [phone] 08/01/2015 11:19 AM  If 7PM-7AM, please contact night-coverage at [email or web address], password [facility]

## 2015-08-01 NOTE — Evaluation (Signed)
Speech Language Pathology Evaluation Patient Details Name: [name] MRN: [id number] DOB: 1920/08/25 Today's Date: 08/01/2015 Time: 4696-2952 SLP Time Calculation (min) (ACUTE ONLY): 25 min  Problem List:  Patient Active Problem List   Diagnosis Date Noted  . Altered mental status 07/30/2015  . Elevated lactic acid level 07/30/2015  . Delirium 07/30/2015  . Edema   . Acute CHF (congestive heart failure) (HCC) 06/24/2015  . CHF exacerbation (HCC) 06/24/2015  . Abscess of chest wall 04/26/2015  . GERD (gastroesophageal reflux disease) 04/26/2015  . Hyperkalemia 04/19/2015  . Acute renal failure superimposed on stage 3 chronic kidney disease (HCC) 04/19/2015  . Pacemaker infection (HCC) 04/14/2015  . AV block, 3rd degree (HCC)   . Bradycardia   . Symptomatic bradycardia 02/23/2015  . CAD (coronary artery disease) of bypass graft, along with subtotal left main occlusion and and RCA occlusion - graft dependent 08/03/2013  . Dermatophytosis of nail   . Obesity (BMI 30-39.9) 02/15/2013  . Ischemic cardiomyopathy, EF 45-50% 2D 03/13/12 03/15/2012  . STEMI, 03/10/12 Rx'd with PCI with recurrance 03/11/12 - redo PCI of SVG-RCA 03/13/2012  . S/P CABG x 3, LIMA - LAD, SVG-OM, SVG- PDA 08/05/11 08/05/2011    Class: Hospitalized for  . DM (diabetes mellitus), type 2 with renal complications (HCC) 05/28/2011  . History of gout 05/28/2011  . Hypothyroidism 05/27/2011  . Inf STEMI #1 -PTCA to RCA (05/2011) + LM 80;NSTEMI (07/2011) - CABG x3;  inf STEMI #2 -- SVG-RCA  PCI with DES X 3 on 03/10/12; with re-occlusion 24hrs later (inferior STEMI #3) - redo thrombectomy/PTCA 05/26/2011  . Hypertensive heart disease with CHF (congestive heart failure) (HCC) 05/26/2011  . Dyslipidemia with statin intolerance 05/26/2011   Past Medical History:  Past Medical History  Diagnosis Date  . ST elevation myocardial infarction (STEMI) of inferior wall (HCC) 05/2011; 03/10/2012    a) 11/'12: 100% RCA, dLM 80%  -- POBA of RCA (for planned CABG, not done until re-admission with NSTEMI 1/'13);; b) 8/'13: 100% SVG-RCA (PCI & re-PCI), native RCA 100%; Patent LIMA-p-mLAD, SVG-OM  . CAD S/P percutaneous coronary angioplasty 11/'12; 8/31 & 9/1/'13    a) MV: 100% RCA-POBA, dLM left main 80%; b) 1/'13: NSTEMI --> CABG; c) 2013: 8/31 - 100% RCA & acute SVG-RCA, 100% SVG-OM, 90% LM, 80% p&mLAD, ~70% RI, 50% Cx --> PCI-SVG-RCA: Promus Element DES x 3 (prox 2.5 mm x 38 mm & 2.5 mm x 16 mm, distal 2.5 mm x 16 mm); on 9/1 - accute in-stent Thrombosis - Aspiration thrombectomy & PTCA  . S/P CABG x 4 08/05/11    Dr. Tyrone Sage: LIMA-p-mLAD, SVG-RPDA, SVG-OM; known  100% SVG-OM, Extensive PTCA of SVG-RCA;;  Hospital course complicated by Afib & PNA; after d/c cellulitis of SVG harvest site due to edema  . Episodic atrial fibrillation (HCC) 07/2011    Post-Op CABG  . Ischemic cardiomyopathy 03/2012    Echo: EF 45-50% - Mild basal- mid inferolateral Hypokinesis: Gr 1 DD, mildly increased PAP.  Marland Kitchen Dyslipidemia, goal LDL below 70     statin intolerance (lipitor, crestor drug reaction); Welchol & Zetia  . Hypertension, essential   . DM (diabetes mellitus), type 2 with renal complications (HCC)   . Chronic kidney disease (CKD) stage G3b/A1, moderately decreased glomerular filtration rate (GFR) between 30-44 mL/min/1.73 square meter and albuminuria creatinine ratio less than 30 mg/g   . History of pneumonia     post-op from CABG  . Hypothyroidism     on synthroid  .  Headache(784.0)   . Osteoarthritis   . Anxiety     PRN Xanax   . Dermatophytosis of nail   . Gout     on Allopurinol  . Lower extremity edema     chronic  . GERD (gastroesophageal reflux disease) 04/26/2015  . Acute CHF (congestive heart failure) (HCC) 06/24/2015  . Delirium 07/30/2015  . Presence of permanent cardiac pacemaker    Past Surgical History:  Past Surgical History  Procedure Laterality Date  . Cardiac catheterization  November 2012; August and  September 2013    Patent LIMA-LAD, patent she had an OM, patent stented SVG-RCA; occluded native RCA, 90% ostial left main, tandem 80% proximal and mid LAD, 70% mid Circumflex  . Coronary angioplasty  November 2012    PTCA only of RCA and setting of inferior STEMI  . Coronary artery bypass graft  08/05/2011    Procedure: CORONARY ARTERY BYPASS GRAFTING (CABG);  Surgeon: [name], MD;  Location: [facility] OR;  Service: Open Heart Surgery;  Laterality: N/A;  coronary artery bypass graft times 4 using left internal mammary artery and right leg saphenous vein harvested endoscopically  . Joint replacement      both hips replaced  . I&d extremity  09/21/2011    Procedure: IRRIGATION AND DEBRIDEMENT EXTREMITY;  Surgeon: [name], MD;  Location[facility] OR;  Service: Vascular;  Laterality: Right;  with wound vac placement  . Coronary angioplasty with stent placement  03/10/2012     infferior STEMI: Occluded native RCA and SVG-RCA --> thrombectomy and 3 stent placement to the SVG-RCA (2.5 mm right 38 mm and 2 proximal distal overlapping 2.5 mm x 16 mm Promus DES stents:  . Coronary angioplasty  03/11/2012    Inferior STEMI #3: Reoccluded SVG-RCA; extensive thrombectomy and post dilation PTCA  . Transthoracic echocardiogram  September 2013    EF 45-50%, mild hypokinesis of the basal and mid inferolateral wall, grade 1 diastolic dysfunction, mildly increased artery pressures.  . Left heart catheterization with coronary angiogram N/A 05/26/2011    Procedure: LEFT HEART CATHETERIZATION WITH CORONARY ANGIOGRAM;  Surgeon: Marykay Lex, MD;  Location: [facility] CATH LAB;  Service: Cardiovascular;  Laterality: N/A;  . Percutaneous coronary intervention-balloon only N/A 05/26/2011    Procedure: PERCUTANEOUS CORONARY INTERVENTION-BALLOON ONLY;  Surgeon: Marykay Lex, MD;  Location: [facility] CATH LAB;  Service: Cardiovascular;  Laterality: N/A;  . Left heart cath N/A 03/10/2012    Procedure: LEFT HEART CATH;  Surgeon:  Herby Abraham, MD;  Location: [facility] CATH LAB;  Service: Cardiovascular;  Laterality: N/A;  . Percutaneous coronary stent intervention (pci-s)  03/10/2012    Procedure: PERCUTANEOUS CORONARY STENT INTERVENTION (PCI-S);  Surgeon: Herby Abraham, MD;  Location: [facility] CATH LAB;  Service: Cardiovascular;;  . Left heart catheterization with coronary angiogram Bilateral 03/11/2012    Procedure: LEFT HEART CATHETERIZATION WITH CORONARY ANGIOGRAM;  Surgeon: Thurmon Fair, MD;  Location: [facility] CATH LAB;  Service: Cardiovascular;  Laterality: Bilateral;  . Ep implantable device N/A 02/24/2015    Procedure: Pacemaker Implant;  Surgeon: Thurmon Fair, MD;  Location: [facility] INVASIVE CV LAB;  Service: Cardiovascular;  Laterality: N/A;  . Ep implantable device N/A 04/17/2015    Procedure: Lead Extraction, Can Extraction;  Surgeon: Marinus Maw, MD;  Location: [facility] INVASIVE CV LAB;  Service: Cardiovascular;  Laterality: N/A;  . Cardiac catheterization N/A 04/17/2015    Procedure: Temporary Pacemaker;  Surgeon: Marinus Maw, MD;  Location: [facility] INVASIVE CV LAB;  Service: Cardiovascular;  Laterality: N/A;  . Ep implantable device N/A 04/21/2015    Procedure: Pacemaker Implant;  Surgeon: Marinus Maw, MD;  Location: [facility] INVASIVE CV LAB;  Service: Cardiovascular;  Laterality: N/A;  . Pacemaker lead removal  04/21/2015    Procedure: Pacemaker Lead Removal;  Surgeon: Marinus Maw, MD;  Location: [facility] INVASIVE CV LAB;  Service: Cardiovascular;;  LV    HPI:  82 y.o. female with a past medical history significant for dementia severe, CAD status post STEMI and PCI 3, chronic systolic CHF with EF 40%, and hypothyroidism who presented with worsening delirium. Pt having hallucinations and increased AMS; CXR on 1/19 indicated stable cardiomegaly mild congestive changes. No focal consolidation. CT head on 1/19 negative for age  Assessment / Plan / Recommendation Clinical Impression   Limited assessment d/t pt refusal and emotional  lability and stating "I just want to go home" repeatedly during evaluation.  Pt refused graphic expression (writing) and reading tasks, although she does wear glasses when reading per son.  Pt able to answer some personal information re: name, family members' names, etc with accuracy and follow simple 2-3 step commands.  Naming functional and indicating needs/wants functionally during assessment. Pt is able to communicate at a sentence to basic conversational level and speech is intelligible 100% of the time.   Her son stated she is functioning close to baseline with exception of intermittent hallucination-type events.  No ST f/u indicated at this time d/t pt being close to baseline level of functioning.    SLP Assessment  Patient does not need any further Speech Language Pathology Services    Follow Up Recommendations  None    Frequency and Duration   n/a        SLP Evaluation Prior Functioning  Cognitive/Linguistic Baseline: Baseline deficits Baseline deficit details: severe dementia Type of Home: House  Lives With: Family Available Help at Discharge: Family;Personal care attendant;Available 24 hours/day   Cognition  Overall Cognitive Status: History of cognitive impairments - at baseline Arousal/Alertness: Awake/alert Orientation Level: Disoriented to place;Disoriented to time;Disoriented to situation Behaviors: Restless;Lability    Comprehension  Auditory Comprehension Overall Auditory Comprehension: Impaired at baseline Commands: Impaired Multistep Basic Commands: 25-49% accurate Conversation: Simple Interfering Components: Anxiety Visual Recognition/Discrimination Discrimination: Not tested Reading Comprehension Reading Status: Unable to assess (comment) (pt refused)    Expression Expression Primary Mode of Expression: Verbal Verbal Expression Overall Verbal Expression: Impaired at baseline Level of Generative/Spontaneous Verbalization: Sentence Non-Verbal Means of  Communication: Not applicable Written Expression Dominant Hand: Right Written Expression: Unable to assess (comment) (Pt refusal)   Oral / Motor  Oral Motor/Sensory Function Overall Oral Motor/Sensory Function: Within functional limits Motor Speech Overall Motor Speech: Appears within functional limits for tasks assessed Respiration: Within functional limits Phonation: Normal Resonance: Within functional limits Articulation: Within functional limitis Intelligibility: Intelligible Motor Planning: Witnin functional limits Motor Speech Errors: Not applicable Interfering Components: Premorbid status                       Gari Trovato,PAT, M.S., CCC-SLP 08/01/2015, 11:42 AM

## 2015-08-02 ENCOUNTER — Inpatient Hospital Stay (HOSPITAL_COMMUNITY): Payer: Medicare Other

## 2015-08-02 LAB — SODIUM
SODIUM: 144 mmol/L (ref 135–145)
SODIUM: 145 mmol/L (ref 135–145)
SODIUM: 143 mmol/L (ref 135–145)
Sodium: 145 mmol/L (ref 135–145)

## 2015-08-02 LAB — COMPREHENSIVE METABOLIC PANEL
ALBUMIN: 2.3 g/dL — AB (ref 3.5–5.0)
ALK PHOS: 98 U/L (ref 38–126)
ALT: 9 U/L — ABNORMAL LOW (ref 14–54)
AST: 15 U/L (ref 15–41)
Anion gap: 8 (ref 5–15)
BUN: 23 mg/dL — AB (ref 6–20)
CHLORIDE: 115 mmol/L — AB (ref 101–111)
CO2: 21 mmol/L — ABNORMAL LOW (ref 22–32)
Calcium: 9 mg/dL (ref 8.9–10.3)
Creatinine, Ser: 1.52 mg/dL — ABNORMAL HIGH (ref 0.44–1.00)
GFR calc Af Amer: 33 mL/min — ABNORMAL LOW (ref >60)
GFR calc non Af Amer: 29 mL/min — ABNORMAL LOW (ref >60)
GLUCOSE: 57 mg/dL — AB (ref 65–99)
POTASSIUM: 4.2 mmol/L (ref 3.5–5.1)
SODIUM: 144 mmol/L (ref 135–145)
TOTAL PROTEIN: 4.7 g/dL — AB (ref 6.5–8.1)
Total Bilirubin: 0.3 mg/dL (ref 0.3–1.2)

## 2015-08-02 LAB — CBC WITH DIFFERENTIAL/PLATELET
BASOS ABS: 0 10*3/uL (ref 0.0–0.1)
BASOS PCT: 0 %
EOS ABS: 0.1 10*3/uL (ref 0.0–0.7)
Eosinophils Relative: 2 %
HCT: 25.4 % — ABNORMAL LOW (ref 36.0–46.0)
HEMOGLOBIN: 8 g/dL — AB (ref 12.0–15.0)
Lymphocytes Relative: 38 %
Lymphs Abs: 1.4 10*3/uL (ref 0.7–4.0)
MCH: 26.1 pg (ref 26.0–34.0)
MCHC: 31.5 g/dL (ref 30.0–36.0)
MCV: 82.7 fL (ref 78.0–100.0)
MONOS PCT: 14 %
Monocytes Absolute: 0.5 10*3/uL (ref 0.1–1.0)
NEUTROS ABS: 1.6 10*3/uL — AB (ref 1.7–7.7)
NEUTROS PCT: 46 %
Platelets: 130 10*3/uL — ABNORMAL LOW (ref 150–400)
RBC: 3.07 MIL/uL — ABNORMAL LOW (ref 3.87–5.11)
RDW: 22.1 % — AB (ref 11.5–15.5)
WBC: 3.5 10*3/uL — ABNORMAL LOW (ref 4.0–10.5)

## 2015-08-02 NOTE — Progress Notes (Signed)
Please note, patient's husband of 72 years died a month ago, source of her emotional upset per her family.

## 2015-08-02 NOTE — Progress Notes (Signed)
[facility] Progress Note  Patient: [name]:[id number]   PCP: Michiel Sites, MD DOB: 1920-09-21   DOA: 07/29/2015   DOS: 08/02/2015   Date of Service: the patient was seen and examined on 08/02/2015  Subjective: Patient was more lethargic this morning and was not able to answering questions appropriately although was able to follow the command. She was also able to take medication swallow pills as well as sip water without any evidence of aspiration. Unclear whether she was not willing to cooperate with my evaluation. Nutrition: Able to tolerate oral diet Activity: Mostly bedridden Last BM: 07/30/2015  Assessment and Plan: 1. Altered mental status Hypernatremia. Dehydration. No sign of evidence of infection, CT scan of the head is unremarkable, TSH normal, B-12 and also stable. Sodium levels stabilized again. We will stop the fluid. We will continue close monitoring. Her delirium or acute encephalopathy can also be secondary to progressive dementia.  2. Acute on chronic kidney injury. Improving with hydration. I'm worried about her renal function as well as sodium level when she was not going to be eating. She has some crackles on examination with good chest x-ray.  3. Diabetes mellitus history. Stable continue monitoring.  4. Coronary artery disease with chronic combined CHF. Continue beta blocker, continue aspirin. Holding Lasix.  5. Thyroidism. Continue Synthroid.  6. Anemia of chronic disease. H&H remained stable continue monitoring.   DVT Prophylaxis: subcutaneous Heparin Nutrition: Cardiac diet Advance goals of care discussion: DNR/DNI  Brief Summary of Hospitalization:  HPI: As per the H and P dictated on admission, "[name] is a 82 y.o. female with a past medical history significant for dementia severe, CAD status post STEMI and PCI 3, chronic systolic CHF with EF 40%, and hypothyroidism who presents with worsening delirium.  All  history is collected from the patient's children were present at the bedside as the patient is unable to provide history for herself. She had an episode of congestive heart failure in December, was diuresed and has been back home since then with family. At baseline, she ambulates with a walker, doesn't leave the house, and usually knows her family but has no memory for anything else.   2 days ago she became slightly more agitated and aggressive. Last night she didn't sleep at all, and yesterday and today she has been hallucinating "quite a bit". Today also family noticed that she was not eating and complaining of lower abdominal discomfort and was not eating. At no point did she have a fever, cough, difficulty breathing, or appear to be working hard to breathe. She had a similar episode of erratic behavior like this 2 years ago when she had her pacemaker pocket infection, but her pacemaker appears normal at this time.  In the ED, the patient's temperature was 36C, she was hemodynamically stable, and had no leukocytosis. UA and chest x-ray were normal. She had had slight AKI, minimally elevated lactic acid, and was combative. TRH were asked to evaluate for admission." Daily update, Procedures: Sodium level elevated again 08/01/2015 Consultants: None Antibiotics: Anti-infectives    None       Family Communication: no family was present at bedside, at the time of interview.   Disposition:  Expected discharge date:08/03/2015 Barriers to safe discharge: Improvement in mentation as well as sodium level   Intake/Output Summary (Last 24 hours) at 08/02/15 1645 Last data filed at 08/02/15 1520  Gross per 24 hour  Intake 3338.75 ml  Output   1300 ml  Net  2038.75 ml   There were no vitals filed for this visit.  Objective: Physical Exam: Filed Vitals:   08/01/15 0835 08/01/15 2111 08/02/15 0544 08/02/15 1447  BP: 134/75 96/64 125/60 133/72  Pulse: 64 60 63 67  Temp:  98.6 F (37 C) 97.5 F  (36.4 C) 97.3 F (36.3 C)  TempSrc:  Oral Oral Oral  Resp: 20 18 18 16   SpO2: 99% 100% 100% 98%     General: Appear in mild distress, no Rash; Oral Mucosa moist. Cardiovascular: S1 and S2 Present, no Murmur, no JVD Respiratory: Bilateral Air entry present and bilateral basal Crackles, no wheezes Abdomen: Bowel Sound present, Soft and no tenderness Extremities: no Pedal edema, no calf tenderness Neurology: Grossly no focal neuro deficit. Appears confused and disoriented  Data Reviewed: CBC:  Recent Labs Lab 07/29/15 2235 07/30/15 0354 07/31/15 0515 08/01/15 0602 08/02/15 1130  WBC 8.2 8.4 5.0 4.6 3.5*  NEUTROABS 6.8  --   --   --  1.6*  HGB 11.4* 10.3* 8.2* 8.5* 8.0*  HCT 36.7 33.8* 26.2* 26.5* 25.4*  MCV 81.9 82.2 81.6 80.8 82.7  PLT 155 138* 117* 142* 130*   Basic Metabolic Panel:  Recent Labs Lab 07/30/15 1524 07/31/15 0515 08/01/15 0602 08/01/15 1325 08/01/15 1730 08/01/15 2102 08/01/15 2256 08/02/15 0306 08/02/15 1130  NA 145 145 146* 146* 143 143 143 145 144  K 4.7 3.9 4.0 4.2  --   --   --   --  4.2  CL 113* 117* 117* 114*  --   --   --   --  115*  CO2 20* 22 23 21*  --   --   --   --  21*  GLUCOSE 130* 72 78 103*  --   --   --   --  57*  BUN 36* 35* 30* 27*  --   --   --   --  23*  CREATININE 1.77* 1.78* 1.73* 1.68*  --   --   --   --  1.52*  CALCIUM 9.3 9.1 9.1 9.1  --   --   --   --  9.0   Liver Function Tests:  Recent Labs Lab 07/29/15 2235 07/30/15 0354 08/02/15 1130  AST 18 13* 15  ALT 10* 8* 9*  ALKPHOS 151* 122 98  BILITOT 0.8 0.7 0.3  PROT 7.0 6.0* 4.7*  ALBUMIN 3.4* 2.9* 2.3*   No results for input(s): LIPASE, AMYLASE in the last 168 hours. No results for input(s): AMMONIA in the last 168 hours.  Cardiac Enzymes: No results for input(s): CKTOTAL, CKMB, CKMBINDEX, TROPONINI in the last 168 hours.  BNP (last 3 results)  Recent Labs  03/01/15 1849 06/24/15 1908  BNP 224.5* 841.3*    CBG:  Recent Labs Lab 07/29/15 2224  07/30/15 0047  GLUCAP 116* 117*    Recent Results (from the past 240 hour(s))  Culture, blood (Routine X 2) w Reflex to ID Panel     Status: None (Preliminary result)   Collection Time: 07/30/15  1:10 AM  Result Value Ref Range Status   Specimen Description BLOOD LEFT ARM  Final   Special Requests IN PEDIATRIC BOTTLE  Final   Culture NO GROWTH 3 DAYS  Final   Report Status PENDING  Incomplete  Culture, blood (Routine X 2) w Reflex to ID Panel     Status: None (Preliminary result)   Collection Time: 07/30/15  1:15 AM  Result Value Ref Range Status  Specimen Description BLOOD LEFT HAND  Final   Special Requests IN PEDIATRIC BOTTLE  Final   Culture NO GROWTH 3 DAYS  Final   Report Status PENDING  Incomplete     Studies: Dg Chest Port 1 View  08/02/2015  CLINICAL DATA:  Worsening dementia and delirium. EXAM: PORTABLE CHEST 1 VIEW COMPARISON:  07/29/2015 FINDINGS: Dual lead cardiac pacemaker is unchanged. The cardiac silhouette is stably enlarged. Mediastinal contours appear intact. The aorta is torturous and contains atherosclerotic calcifications. There is no evidence of focal airspace consolidation, pleural effusion or pneumothorax. Osseous structures are without acute abnormality. Soft tissues are grossly normal. IMPRESSION: Stably enlarged cardiac silhouette. No evidence of focal airspace consolidation. Electronically Signed   By: Ted Mcalpine M.D.   On: 08/02/2015 15:52     Scheduled Meds: . allopurinol  100 mg Oral Daily  . carvedilol  6.25 mg Oral BID WC  . colesevelam  1,875 mg Oral BID WC  . levothyroxine  100 mcg Oral QAC breakfast  . pantoprazole  40 mg Oral Daily  . polyethylene glycol  17 g Oral Daily  . ticagrelor  90 mg Oral BID   Continuous Infusions:   PRN Meds: acetaminophen **OR** acetaminophen, hydrALAZINE  Time spent: 30 minutes  Author: Lynden Oxford, MD [facility] Pager: [phone] 08/02/2015 4:45 PM  If 7PM-7AM, please contact  night-coverage at [email or web address], password [facility]

## 2015-08-03 ENCOUNTER — Encounter (HOSPITAL_COMMUNITY): Payer: Self-pay | Admitting: Internal Medicine

## 2015-08-03 DIAGNOSIS — F32A Depression, unspecified: Secondary | ICD-10-CM

## 2015-08-03 DIAGNOSIS — E870 Hyperosmolality and hypernatremia: Secondary | ICD-10-CM | POA: Diagnosis present

## 2015-08-03 DIAGNOSIS — F4329 Adjustment disorder with other symptoms: Secondary | ICD-10-CM | POA: Diagnosis present

## 2015-08-03 DIAGNOSIS — F329 Major depressive disorder, single episode, unspecified: Secondary | ICD-10-CM

## 2015-08-03 DIAGNOSIS — D539 Nutritional anemia, unspecified: Secondary | ICD-10-CM

## 2015-08-03 DIAGNOSIS — F4321 Adjustment disorder with depressed mood: Secondary | ICD-10-CM

## 2015-08-03 DIAGNOSIS — Z950 Presence of cardiac pacemaker: Secondary | ICD-10-CM | POA: Diagnosis present

## 2015-08-03 DIAGNOSIS — F4381 Prolonged grief disorder: Secondary | ICD-10-CM | POA: Diagnosis present

## 2015-08-03 HISTORY — DX: Depression, unspecified: F32.A

## 2015-08-03 LAB — BASIC METABOLIC PANEL
Anion gap: 3 — ABNORMAL LOW (ref 5–15)
BUN: 19 mg/dL (ref 6–20)
CHLORIDE: 117 mmol/L — AB (ref 101–111)
CO2: 24 mmol/L (ref 22–32)
CREATININE: 1.36 mg/dL — AB (ref 0.44–1.00)
Calcium: 9 mg/dL (ref 8.9–10.3)
GFR calc Af Amer: 38 mL/min — ABNORMAL LOW (ref >60)
GFR calc non Af Amer: 33 mL/min — ABNORMAL LOW (ref >60)
GLUCOSE: 94 mg/dL (ref 65–99)
Potassium: 4.2 mmol/L (ref 3.5–5.1)
Sodium: 144 mmol/L (ref 135–145)

## 2015-08-03 LAB — CBC
HCT: 28.5 % — ABNORMAL LOW (ref 36.0–46.0)
Hemoglobin: 8.8 g/dL — ABNORMAL LOW (ref 12.0–15.0)
MCH: 25.8 pg — AB (ref 26.0–34.0)
MCHC: 30.9 g/dL (ref 30.0–36.0)
MCV: 83.6 fL (ref 78.0–100.0)
PLATELETS: 136 10*3/uL — AB (ref 150–400)
RBC: 3.41 MIL/uL — AB (ref 3.87–5.11)
RDW: 22.4 % — AB (ref 11.5–15.5)
WBC: 3.8 10*3/uL — ABNORMAL LOW (ref 4.0–10.5)

## 2015-08-03 LAB — SODIUM
Sodium: 143 mmol/L (ref 135–145)
Sodium: 143 mmol/L (ref 135–145)

## 2015-08-03 MED ORDER — CITALOPRAM HYDROBROMIDE 20 MG PO TABS: 10.0000 mg | ORAL_TABLET | Freq: Every day | ORAL | Status: DC

## 2015-08-03 MED ORDER — FLUOXETINE HCL 10 MG PO CAPS: 10.0000 mg | ORAL_CAPSULE | Freq: Every day | ORAL | Status: DC

## 2015-08-03 MED ORDER — FERROUS SULFATE 325 (65 FE) MG PO TABS: 325.0000 mg | ORAL_TABLET | Freq: Every day | ORAL | Status: DC

## 2015-08-03 MED ORDER — FLUOXETINE HCL 10 MG PO CAPS
10.0000 mg | ORAL_CAPSULE | Freq: Every day | ORAL | Status: DC
  Administered 2015-08-03 – 2015-08-05 (×3): 10 mg via ORAL
  Filled 2015-08-03 (×5): qty 1

## 2015-08-03 MED ORDER — FOLIC ACID 1 MG PO TABS: 1.0000 mg | ORAL_TABLET | Freq: Every day | ORAL | Status: DC

## 2015-08-03 MED ORDER — LORAZEPAM 0.5 MG PO TABS
0.5000 mg | ORAL_TABLET | Freq: Once | ORAL | Status: AC
  Administered 2015-08-03: 0.5 mg via ORAL
  Filled 2015-08-03: qty 1

## 2015-08-03 MED ORDER — FERROUS SULFATE 325 (65 FE) MG PO TABS
325.0000 mg | ORAL_TABLET | Freq: Every day | ORAL | Status: DC
  Administered 2015-08-04 – 2015-08-05 (×2): 325 mg via ORAL
  Filled 2015-08-03 (×2): qty 1

## 2015-08-03 MED ORDER — FOLIC ACID 1 MG PO TABS
1.0000 mg | ORAL_TABLET | Freq: Every day | ORAL | Status: DC
  Administered 2015-08-03 – 2015-08-05 (×3): 1 mg via ORAL
  Filled 2015-08-03 (×3): qty 1

## 2015-08-03 MED ORDER — MIRTAZAPINE 15 MG PO TABS: 15.0000 mg | ORAL_TABLET | Freq: Every day | ORAL | Status: DC

## 2015-08-03 MED ORDER — FUROSEMIDE 20 MG PO TABS: 20.0000 mg | ORAL_TABLET | Freq: Every day | ORAL | Status: DC | PRN

## 2015-08-03 MED ORDER — MIRTAZAPINE 15 MG PO TABS
15.0000 mg | ORAL_TABLET | Freq: Every day | ORAL | Status: DC
  Administered 2015-08-04 (×2): 15 mg via ORAL
  Filled 2015-08-03 (×4): qty 1

## 2015-08-03 NOTE — Clinical Social Work Note (Signed)
Clinical Social Work Assessment  Patient Details  Name: [name] MRN: [id number] Date of Birth: 11/11/1920  Date of referral:  08/03/15               Reason for consult:  Facility Placement, Grief and Loss                Permission sought to share information with:  Facility Medical sales representative, Guardian (Pt not alert and oriented) Permission granted to share information::  Yes, Verbal Permission Granted  Name::      (daughter:  [name] )  Agency::   [facility] area)  Relationship::     Contact Information:  [phone]  Housing/Transportation Living arrangements for the past 2 months:  Single Family Home Source of Information:  Adult Children Patient Interpreter Needed:  None Criminal Activity/Legal Involvement Pertinent to Current Situation/Hospitalization:  No - Comment as needed Significant Relationships:  Adult Children, Other Family Members Lives with:  Self Do you feel safe going back to the place where you live?  No (Pt unable to take of herself at this time ) Need for family participation in patient care:  Yes (Comment) (Pt unable to make decisions at this time)  Care giving concerns:  Pts daughter stated "I would love to keep her at home but she needs some rehab first, and it may even need to be long term.    Social Worker assessment / plan:  CSW unable to meet with the Pt at this time as she is not oriented x4. CSW attempted to contact Pts son [name]), however he was unavailable. CSW left him a message and then contacted Pt's daughter [name]. [name] lives out of town and will be coming in to [place] tomorrow to assist with the placement of her mother. Pt's daughter stated that they are looking for the placement to be short term, however know that a long term placement may be necessary. The family is open to long term placement if need be. CSW explained to the Pt's daughter that CSW will relay that information to the facilities so  that the Pt will only be placed one time and not have to be moved. Pt's daughter stated that he Pt recently lost her spouse on Dec 8th, 2016. Pt's daughter stated that currently her mother lives alone, however has 24 hour supervision. The family are unsure if they can continue to keep up 24 hour supervision. CSW updated Pt's daughter on Pt's LOC and explained that at her current disorientation: placement may be longer than a day due to delay in obtaining a PASRR. Pts daughter voiced understanding. CSW will be meeting with the Pt's daughter tomorrow and providing her with bed offers. [name] choices are 1. [facility] 2. [facility]  3. [facility] (Pt is a [employer]). CSW received permission to send Pt information out today.   Employment status:  Retired Database administrator PT Recommendations:  Skilled Nursing Facility Information / Referral to community resources:  Skilled Nursing Facility  Patient/Family's Response to care:  Pt's dtr is agreeable to d/c plan and assistance with coordination of discharge.   Patient/Family's Understanding of and Emotional Response to Diagnosis, Current Treatment, and Prognosis:  Pt's family is hopeful and supportive to Pt's current health concerns and current grief process.   Emotional Assessment Appearance:  Appears stated age Attitude/Demeanor/Rapport:  Unable to Assess Affect (typically observed):  Unable to Assess Orientation:  Fluctuating Orientation (Suspected and/or reported Sundowners) (Not oriented at  the time of this assessment, spoke with the daughter) Alcohol / Substance use:  Never Used Psych involvement (Current and /or in the community):  No (Comment)  Discharge Needs  Concerns to be addressed:  Discharge Planning Concerns Readmission within the last 30 days:  No Current discharge risk:  Lives alone Barriers to Discharge:  Awaiting State Approval ([place])   [name] 08/03/2015, 5:32 PM

## 2015-08-03 NOTE — NC FL2 (Signed)
[name] MEDICAID FL2 LEVEL OF CARE SCREENING TOOL     IDENTIFICATION  Patient Name: [name] Birthdate: Oct 05, 1920 Sex: female Admission Date (Current Location): 07/29/2015  [name] and [name] Number:  Producer, television/film/video and Address:  [facility], [address] [place], [place] [address]      Provider Number: [id number]  Attending Physician Name and Address:  [name] Salter, MD  Relative Name and Phone Number:  [name]     Current Level of Care: Hospital Recommended Level of Care: Skilled Nursing Facility Prior Approval Number:    Date Approved/Denied:   PASRR Number:    Discharge Plan: SNF    Current Diagnoses: Patient Active Problem List   Diagnosis Date Noted  . Anemia due to poor nutrition 08/03/2015  . 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 Depressed 08/03/2015  . Cardiac pacemaker in situ 08/03/2015  . Hypernatremia 08/03/2015  . Altered mental status 07/30/2015  . Elevated lactic acid level 07/30/2015  . Delirium 07/30/2015  . Edema   . Acute CHF (congestive heart failure) (HCC) 06/24/2015  . CHF exacerbation (HCC) 06/24/2015  . Abscess of chest wall 04/26/2015  . GERD (gastroesophageal reflux disease) 04/26/2015  . Hyperkalemia 04/19/2015  . Acute renal failure superimposed on stage 3 chronic kidney disease (HCC) 04/19/2015  . Pacemaker infection (HCC) 04/14/2015  . AV block, 3rd degree (HCC)   . Bradycardia   . Symptomatic bradycardia 02/23/2015  . CAD (coronary artery disease) of bypass graft, along with subtotal left main occlusion and and RCA occlusion - graft dependent 08/03/2013  . Dermatophytosis of nail   . Obesity (BMI 30-39.9) 02/15/2013  . Ischemic cardiomyopathy, EF 45-50% 2D 03/13/12 03/15/2012  . STEMI, 03/10/12 Rx'd with PCI with recurrance 03/11/12 - redo PCI of SVG-RCA 03/13/2012  . S/P CABG x 3, LIMA - LAD, SVG-OM, SVG- PDA 08/05/11 08/05/2011    Class: Hospitalized for  . DM (diabetes  mellitus), type 2 with renal complications (HCC) 05/28/2011  . History of gout 05/28/2011  . Hypothyroidism 05/27/2011  . Inf STEMI #1 -PTCA to RCA (05/2011) + LM 80;NSTEMI (07/2011) - CABG x3;  inf STEMI #2 -- SVG-RCA  PCI with DES X 3 on 03/10/12; with re-occlusion 24hrs later (inferior STEMI #3) - redo thrombectomy/PTCA 05/26/2011  . Hypertensive heart disease with CHF (congestive heart failure) (HCC) 05/26/2011  . Dyslipidemia with statin intolerance 05/26/2011    Orientation RESPIRATION BLADDER Height & Weight     (Not currently oriented )  Normal Incontinent   137 lbs.  BEHAVIORAL SYMPTOMS/MOOD NEUROLOGICAL BOWEL NUTRITION STATUS      Continent Diet  AMBULATORY STATUS COMMUNICATION OF NEEDS Skin   Limited Assist Verbally Normal                       Personal Care Assistance Level of Assistance  Dressing, Feeding, Bathing Bathing Assistance: Limited assistance Feeding assistance: Limited assistance Dressing Assistance: Limited assistance Total Care Assistance: Limited assistance   Functional Limitations Info             SPECIAL CARE FACTORS FREQUENCY  OT (By licensed OT)       OT Frequency: minimum of 2xs per week            Contractures Contractures Info: Not present    Additional Factors Info  Code Status, Allergies, Psychotropic (O2   ) Code Status Info: DNR Allergies Info: Statins, Tape  Psychotropic Info: AMS Insulin Sliding Scale Info:  (  not applicable )       Current Medications (08/03/2015):  This is the current hospital active medication list Current Facility-Administered Medications  Medication Dose Route Frequency Provider Last Rate Last Dose  . acetaminophen (TYLENOL) tablet 650 mg  650 mg Oral Q6H PRN Alberteen Sam, MD   650 mg at 08/03/15 1135   Or  . acetaminophen (TYLENOL) suppository 650 mg  650 mg Rectal Q6H PRN Alberteen Sam, MD      . allopurinol (ZYLOPRIM) tablet 100 mg  100 mg Oral Daily Alberteen Sam, MD    100 mg at 08/03/15 0908  . carvedilol (COREG) tablet 6.25 mg  6.25 mg Oral BID WC Alberteen Sam, MD   6.25 mg at 08/03/15 1651  . colesevelam [facility]) tablet 1,875 mg  1,875 mg Oral BID WC Alberteen Sam, MD   1,875 mg at 08/03/15 1407  . [START ON 08/04/2015] ferrous sulfate tablet 325 mg  325 mg Oral Q breakfast [name] Salter, MD      . FLUoxetine (PROZAC) capsule 10 mg  10 mg Oral Daily [name] Salter, MD   10 mg at 08/03/15 1406  . folic acid (FOLVITE) tablet 1 mg  1 mg Oral Daily [name] Salter, MD   1 mg at 08/03/15 1411  . levothyroxine (SYNTHROID, LEVOTHROID) tablet 100 mcg  100 mcg Oral QAC breakfast Alberteen Sam, MD   100 mcg at 08/03/15 0908  . mirtazapine (REMERON) tablet 15 mg  15 mg Oral QHS [name] Salter, MD      . pantoprazole (PROTONIX) EC tablet 40 mg  40 mg Oral Daily Alberteen Sam, MD   40 mg at 08/03/15 0908  . polyethylene glycol (MIRALAX / GLYCOLAX) packet 17 g  17 g Oral Daily Alberteen Sam, MD   17 g at 08/03/15 0907  . ticagrelor (BRILINTA) tablet 90 mg  90 mg Oral BID Alberteen Sam, MD   90 mg at 08/03/15 0909     Discharge Medications: Please see discharge summary for a list of discharge medications.  Relevant Imaging Results:  Relevant Lab Results:   Additional Information SSN:  [id number]  [name]

## 2015-08-03 NOTE — Discharge Summary (Addendum)
[facility] Discharge Summary   Patient: [name]:[id number]   PCP: Michiel Sites, MD DOB: [birth year]   Date of admission: 07/29/2015   Date of discharge: 08/03/2015    Discharge Diagnoses:  Principal Problem:   Hypernatremia Active Problems:   Hypothyroidism   DM (diabetes mellitus), type 2 with renal complications (HCC)   Ischemic cardiomyopathy, EF 45-50% 2D 03/13/12   AV block, 3rd degree (HCC)   Acute renal failure superimposed on stage 3 chronic kidney disease (HCC)   Elevated lactic acid level   Delirium   Anemia due to poor nutrition   Prolonged grief reaction   Depressed   Cardiac pacemaker in situ   Recommendations for Outpatient Follow-up:  1. Please follow up with PCP in 1 week with repeat BMP    Diet recommendation: heart healthy diet  Activity: The patient is advised to gradually reintroduce usual activities.  Discharge Condition: good  History of present illness: As per the H and P dictated on admission, "[name] is a 82 y.o. female with a past medical history significant for dementia severe, CAD status post STEMI and PCI 3, chronic systolic CHF with EF 40%, and hypothyroidism who presents with worsening delirium.  All history is collected from the patient's children were present at the bedside as the patient is unable to provide history for herself. She had an episode of congestive heart failure in December, was diuresed and has been back home since then with family. At baseline, she ambulates with a walker, doesn't leave the house, and usually knows her family but has no memory for anything else.   2 days ago she became slightly more agitated and aggressive. Last night she didn't sleep at all, and yesterday and today she has been hallucinating "quite a bit". Today also family noticed that she was not eating and complaining of lower abdominal discomfort and was not eating. At no point did she have a fever, cough, difficulty  breathing, or appear to be working hard to breathe. She had a similar episode of erratic behavior like this 2 years ago when she had her pacemaker pocket infection, but her pacemaker appears normal at this time.  In the ED, the patient's temperature was 36C, she was hemodynamically stable, and had no leukocytosis. UA and chest x-ray were normal. She had had slight AKI, minimally elevated lactic acid, and was combative. TRH were asked to evaluate for admission."  Hospital Course:  Summary of her active problems in the hospital is as following. Principal Problem:   Hypernatremia No sign of evidence of infection, CT scan of the head is unremarkable, TSH normal, B-12 and also stable. Sodium levels stabilized Her delirium or acute encephalopathy can also be secondary to progressive dementia.  Acute on chronic kidney injury. Ischemic cardiomyopathy, EF 45-50% 2D 03/13/12 Improving with hydration.  I'm worried about her renal function as well as sodium level when she was not going to be eating. Will change lasix to as needed instead of scheduled dose, need to monitor daily weight and in and out closely.  CAD Continue brillinta home dose. Not seeing her on aspirin in recent admission or past medication reconciliation As per note from cardiology patient was suppose to start on dual antiplatelet once her pacemaker pocket bleeding has been ruled out. And later on was told to resume the brillinta, she has appointment on 02/09, will recommend to discuss about antiplatelet at that time.   DM (diabetes mellitus), type 2 with renal complications (HCC) Glucose  stable no active treatment prior to arrival and will continue to monitor.  AV block, 3rd degree (HCC) Cardiac pacemaker in situ No evidence of arrhythmia   Anemia due to poor nutrition Start iron supplementation no evidence of active bleed.  Prolonged grief reaction Depressed Poor oral intake Recently has a death of her husband in her family  and has not been eating enough as well as sleeping and continues to cry. Start on mirtazapine and zoloft and monitor clinical response. Also add Megace.  Hypothyroidism  Continue synthroid.  All other chronic medical condition were stable during the hospitalization.  Patient was seen by physical therapy, who recommended SNF, which was arranged by Child psychotherapist and case Production designer, theatre/television/film. On the day of the discharge the patient's sodium level was stable and she has been tolerating oral diet, and no other acute medical condition were reported by patient. the patient was felt safe to be discharge at [facility].  Procedures and Results:  none   Consultations:  none  DISCHARGE MEDICATION: Current Discharge Medication List    START taking these medications   Details  ferrous sulfate 325 (65 FE) MG tablet Take 1 tablet (325 mg total) by mouth daily with breakfast. Qty: 30 tablet, Refills: 0    FLUoxetine (PROZAC) 10 MG capsule Take 1 capsule (10 mg total) by mouth daily. Qty: 30 capsule, Refills: 0    folic acid (FOLVITE) 1 MG tablet Take 1 tablet (1 mg total) by mouth daily. Qty: 30 tablet, Refills: 0    megestrol (MEGACE) 400 MG/10ML suspension Take 10 mLs (400 mg total) by mouth daily. Qty: 240 mL, Refills: 0    mirtazapine (REMERON) 15 MG tablet Take 1 tablet (15 mg total) by mouth at bedtime. Qty: 30 tablet, Refills: 0      CONTINUE these medications which have CHANGED   Details  furosemide (LASIX) 20 MG tablet Take 1 tablet (20 mg total) by mouth daily as needed (for leg swelling or weight gain of 2 Lbs in 1 day). Qty: 30 tablet, Refills: 0      CONTINUE these medications which have NOT CHANGED   Details  acetaminophen (TYLENOL) 325 MG tablet Take 650 mg by mouth 2 (two) times daily.    allopurinol (ZYLOPRIM) 100 MG tablet Take 100 mg by mouth daily.     carvedilol (COREG) 6.25 MG tablet Take 1 tablet (6.25 mg total) by mouth 2 (two) times daily with a meal. Qty: 60 tablet,  Refills: 6    colesevelam (WELCHOL) 625 MG tablet Take 1,875 mg by mouth See admin instructions. Take 3 tablets (1875 mg) with breakfast and with lunch    levothyroxine (SYNTHROID, LEVOTHROID) 100 MCG tablet Take 100 mcg by mouth daily.     pantoprazole (PROTONIX) 40 MG tablet Take 40 mg by mouth daily. Refills: 12    polyethylene glycol (MIRALAX / GLYCOLAX) packet Take 17 g by mouth daily as needed for moderate constipation.    polyvinyl alcohol (LIQUIFILM TEARS) 1.4 % ophthalmic solution Place 1 drop into both eyes 3 (three) times daily.    ticagrelor (BRILINTA) 90 MG TABS tablet TAKE 1 TABLET (90 MG TOTAL) BY MOUTH 2 (TWO) TIMES DAILY. Resume on 03/02/15 Qty: 60 tablet, Refills: 4      STOP taking these medications     LORazepam (ATIVAN) 0.5 MG tablet      sodium bicarbonate 650 MG tablet        Allergies  Allergen Reactions  . Statins Rash  . Tape Rash  Please use paper tape   Discharge Instructions    Diet general    Complete by:  As directed      Discharge instructions    Complete by:  As directed   It is important that you read following instructions as well as go over your medication list with RN to help you understand your care after this hospitalization.  Discharge Instructions: Please maintain adequate oral intake for nutrition and encourage oral fluids.    Please request your primary care physician to go over all Hospital Tests and Procedure/Radiological results at the follow up,  Please get all Hospital records sent to your PCP by signing hospital release before you go home.   Do not take more than prescribed Pain, Sleep and Anxiety Medications. You were cared for by a hospitalist during your hospital stay. If you have any questions about your discharge medications or the care you received while you were in the hospital after you are discharged, you can call the unit and ask to speak with the hospitalist on call if the hospitalist that took care of you is  not available.  Once you are discharged, your primary care physician will handle any further medical issues. Please note that NO REFILLS for any discharge medications will be authorized once you are discharged, as it is imperative that you return to your primary care physician (or establish a relationship with a primary care physician if you do not have one) for your aftercare needs so that they can reassess your need for medications and monitor your lab values. You Must read complete instructions/literature along with all the possible adverse reactions/side effects for all the Medicines you take and that have been prescribed to you. Take any new Medicines after you have completely understood and accept all the possible adverse reactions/side effects. Wear Seat belts while driving.     Increase activity slowly    Complete by:  As directed           Discharge Exam: There were no vitals filed for this visit. Filed Vitals:   08/02/15 2115 08/03/15 0532  BP: 131/88 128/66  Pulse: 59 119  Temp: 98 F (36.7 C) 97.9 F (36.6 C)  Resp: 16 16   General: Appear in mild distress, no Rash; Oral Mucosa moist. Cardiovascular: S1 and S2 Present, no Murmur, no JVD Respiratory: Bilateral Air entry present and Clear to Auscultation, no Crackles, no wheezes Abdomen: Bowel Sound present, Soft and no tenderness Extremities: no Pedal edema, no calf tenderness Neurology: Grossly no focal neuro deficit.  The results of significant diagnostics from this hospitalization (including imaging, microbiology, ancillary and laboratory) are listed below for reference.    Significant Diagnostic Studies: Ct Head Wo Contrast  07/30/2015  CLINICAL DATA:  Altered mental status, lower abdominal pain, with anorexia. Head trauma. History of hypertension, hyperlipidemia, diabetes, chronic kidney disease. EXAM: CT HEAD WITHOUT CONTRAST TECHNIQUE: Contiguous axial images were obtained from the base of the skull through the  vertex without intravenous contrast. COMPARISON:  CT head March 01, 2015 FINDINGS: Mildly motion degraded examination. The ventricles and sulci are normal for age. No intraparenchymal hemorrhage, mass effect nor midline shift. Patchy supratentorial white matter hypodensities are less than expected for patient's age and though non-specific suggest sequelae of chronic small vessel ischemic disease. No acute large vascular territory infarcts. No abnormal extra-axial fluid collections. Basal cisterns are patent. Moderate calcific atherosclerosis of the carotid siphons. No skull fracture. The included ocular globes and orbital contents are non-suspicious.  Soft tissue partially opacifies the included RIGHT maxillary sinus. The mastoid air cells are well aerated. Severe LEFT temporomandibular osteoarthrosis. IMPRESSION: Negative CT head for age. Electronically Signed   By: Awilda Metro M.D.   On: 07/30/2015 03:04   Dg Chest Port 1 View  08/02/2015  CLINICAL DATA:  Worsening dementia and delirium. EXAM: PORTABLE CHEST 1 VIEW COMPARISON:  07/29/2015 FINDINGS: Dual lead cardiac pacemaker is unchanged. The cardiac silhouette is stably enlarged. Mediastinal contours appear intact. The aorta is torturous and contains atherosclerotic calcifications. There is no evidence of focal airspace consolidation, pleural effusion or pneumothorax. Osseous structures are without acute abnormality. Soft tissues are grossly normal. IMPRESSION: Stably enlarged cardiac silhouette. No evidence of focal airspace consolidation. Electronically Signed   By: Ted Mcalpine M.D.   On: 08/02/2015 15:52   Dg Chest Port 1 View  07/30/2015  CLINICAL DATA:  82 year old female with altered mental status EXAM: PORTABLE CHEST 1 VIEW COMPARISON:  Radiograph dated 06/24/2015 FINDINGS: Single-view of the chest demonstrates mild central vascular prominence likely a degree of congestive changes. No focal consolidation, pleural effusion, or  pneumothorax. Stable cardiomegaly. Right pectoral pacemaker device. No acute osseous pathology. IMPRESSION: Stable cardiomegaly with mild congestive changes. No focal consolidation. Electronically Signed   By: Elgie Collard M.D.   On: 07/30/2015 00:24    Microbiology: Recent Results (from the past 240 hour(s))  Culture, blood (Routine X 2) w Reflex to ID Panel     Status: None (Preliminary result)   Collection Time: 07/30/15  1:10 AM  Result Value Ref Range Status   Specimen Description BLOOD LEFT ARM  Final   Special Requests IN PEDIATRIC BOTTLE  Final   Culture NO GROWTH 3 DAYS  Final   Report Status PENDING  Incomplete  Culture, blood (Routine X 2) w Reflex to ID Panel     Status: None (Preliminary result)   Collection Time: 07/30/15  1:15 AM  Result Value Ref Range Status   Specimen Description BLOOD LEFT HAND  Final   Special Requests IN PEDIATRIC BOTTLE  Final   Culture NO GROWTH 3 DAYS  Final   Report Status PENDING  Incomplete     Labs: CBC:  Recent Labs Lab 07/29/15 2235 07/30/15 0354 07/31/15 0515 08/01/15 0602 08/02/15 1130 08/03/15 0330  WBC 8.2 8.4 5.0 4.6 3.5* 3.8*  NEUTROABS 6.8  --   --   --  1.6*  --   HGB 11.4* 10.3* 8.2* 8.5* 8.0* 8.8*  HCT 36.7 33.8* 26.2* 26.5* 25.4* 28.5*  MCV 81.9 82.2 81.6 80.8 82.7 83.6  PLT 155 138* 117* 142* 130* 136*   Basic Metabolic Panel:  Recent Labs Lab 07/31/15 0515 08/01/15 0602 08/01/15 1325  08/02/15 1130 08/02/15 1613 08/02/15 2009 08/02/15 2252 08/03/15 0330 08/03/15 0658  NA 145 146* 146*  < > 144 144 145 143 144 143  K 3.9 4.0 4.2  --  4.2  --   --   --  4.2  --   CL 117* 117* 114*  --  115*  --   --   --  117*  --   CO2 22 23 21*  --  21*  --   --   --  24  --   GLUCOSE 72 78 103*  --  57*  --   --   --  94  --   BUN 35* 30* 27*  --  23*  --   --   --  19  --  CREATININE 1.78* 1.73* 1.68*  --  1.52*  --   --   --  1.36*  --   CALCIUM 9.1 9.1 9.1  --  9.0  --   --   --  9.0  --   < > =  values in this interval not displayed. Liver Function Tests:  Recent Labs Lab 07/29/15 2235 07/30/15 0354 08/02/15 1130  AST 18 13* 15  ALT 10* 8* 9*  ALKPHOS 151* 122 98  BILITOT 0.8 0.7 0.3  PROT 7.0 6.0* 4.7*  ALBUMIN 3.4* 2.9* 2.3*   BNP (last 3 results)  Recent Labs  03/01/15 1849 06/24/15 1908  BNP 224.5* 841.3*   CBG:  Recent Labs Lab 07/29/15 2224 07/30/15 0047  GLUCAP 116* 117*   Time spent: 30 minutes  Signed:  Lynden Oxford  [facility] 08/03/2015, 12:54 PM    See detailed discharge summary as above Patient seen and examined, no new complaints. She has bed available at [facility] place and will be discharged today.

## 2015-08-03 NOTE — Clinical Social Work Placement (Signed)
   CLINICAL SOCIAL WORK PLACEMENT  NOTE  Date:  08/03/2015  Patient Details  Name: [name] MRN: [id number] Date of Birth: Nov 21, 1920  Clinical Social Work is seeking post-discharge placement for this patient at the Skilled  Nursing Facility level of care (*CSW will initial, date and re-position this form in  chart as items are completed):  Yes   Patient/family provided with [facility] Clinical Social Work Department's list of facilities offering this level of care within the geographic area requested by the patient (or if unable, by the patient's family).  Yes   Patient/family informed of their freedom to choose among providers that offer the needed level of care, that participate in Medicare, Medicaid or managed care program needed by the patient, have an available bed and are willing to accept the patient.  Yes   Patient/family informed of [facility]'s ownership interest in [facility] and [facility], as well as of the fact that they are under no obligation to receive care at these facilities.  PASRR submitted to EDS on 08/03/15     PASRR number received on       Existing PASRR number confirmed on       FL2 transmitted to all facilities in geographic area requested by pt/family on 08/03/15     FL2 transmitted to all facilities within larger geographic area on       Patient informed that his/her managed care company has contracts with or will negotiate with certain facilities, including the following:        No   Patient/family informed of bed offers received.  Patient chooses bed at       Physician recommends and patient chooses bed at      Patient to be transferred to   on  .  Patient to be transferred to facility by       Patient family notified on   of transfer.  Name of family member notified:        PHYSICIAN       Additional Comment:    _______________________________________________ [name] 08/03/2015, 6:30 PM

## 2015-08-03 NOTE — Progress Notes (Signed)
Pt was noted to be very anxious and  crying this morning saying "I cant take this anymore". Reported to Dr. Claiborne Billings with order to give ativan 0.5 mg

## 2015-08-03 NOTE — Progress Notes (Signed)
Occupational Therapy Evaluation Patient Details Name: [name] MRN: [id number] DOB: 1921-03-19 Today's Date: 08/03/2015    History of Present Illness 82 y.o. female admitted to [facility] on 07/29/15 for increased confusion acute encephalopathy possibly triggered by dehydration, hypernatremia, acute kidney injury, and constipation.  Pt with significant PMHx of STEMI, s/p CABG, A-fib, ischemic cardiomyopathy, DM, HTN, gout, LE edema, CHF, delirium, pacemaker, bil THA, and multiple heart catheterizations.   Clinical Impression   PTA, pt needed assistance with ADLs from family and PCA and used a RW for mobility. Pt currently required min-min guard assist for functional transfers and ADLs due to impaired balance and increased confusion. No family present during evaluation to confirm PLOF, home information, and d/c plans. Pt will benefit from continued acute OT to increase independence and safety with ADLs and mobility to allow for safe discharge to venue listed below. Recommending SNF for post-acute stay.    Follow Up Recommendations  SNF    Equipment Recommendations  Other (comment) (TBD in next venue)    Recommendations for Other Services       Precautions / Restrictions Precautions Precautions: Fall Restrictions Weight Bearing Restrictions: No      Mobility Bed Mobility               General bed mobility comments: Pt up in chair on OT arrival  Transfers Overall transfer level: Needs assistance Equipment used: Rolling walker (2 wheeled) Transfers: Sit to/from Stand Sit to Stand: Min assist         General transfer comment: Min-min guard assist for transfers and mobility for balance. Pt taking large steps and narrowed BOS causing balance impairments requiring min assist.    Balance Overall balance assessment: Needs assistance Sitting-balance support: No upper extremity supported;Feet supported Sitting balance-Leahy Scale: Fair     Standing balance support:  Bilateral upper extremity supported;During functional activity Standing balance-Leahy Scale: Poor Standing balance comment: Reliant on UE support                             ADL Overall ADL's : Needs assistance/impaired     Grooming: Wash/dry hands;Min guard;Cueing for sequencing;Standing Grooming Details (indicate cue type and reason): Cues to locate and use soap and then papertowel                 Toilet Transfer: Minimal assistance;Cueing for safety;Cueing for sequencing;Ambulation;BSC;RW Toilet Transfer Details (indicate cue type and reason): Cues to bring RW with her to [facility] instead of leaving it to the side. Toileting- Architect and Hygiene: Min guard;Sit to/from stand       Functional mobility during ADLs: Minimal assistance;Rolling walker;Cueing for sequencing General ADL Comments: Pt with increased confusion at times during session - dementia at baseline. Pt reaching into mirror to attempt grabbing papertowels - required physical assist. Required verbal and tactile cues on way back from bathroom for direction to chair, step sequence, and RW management.      Vision Vision Assessment?: Vision impaired- to be further tested in functional context Additional Comments: Difficult to assess due to cognitive deficits and inability follow commands consistently. Pt with likely depth perception deficits.   Perception     Praxis      Pertinent Vitals/Pain Pain Assessment: 0-10 Pain Score: 2  Pain Location: tailbone Pain Descriptors / Indicators: Aching;Sore Pain Intervention(s): Limited activity within patient's tolerance;Monitored during session;Repositioned     Hand Dominance Right   Extremity/Trunk Assessment Upper Extremity Assessment Upper  Extremity Assessment: Generalized weakness   Lower Extremity Assessment Lower Extremity Assessment: Generalized weakness   Cervical / Trunk Assessment Cervical / Trunk Assessment: Kyphotic    Communication Communication Communication: No difficulties   Cognition Arousal/Alertness: Awake/alert Behavior During Therapy: WFL for tasks assessed/performed Overall Cognitive Status: History of cognitive impairments - at baseline Area of Impairment: Orientation;Memory;Following commands;Safety/judgement;Awareness;Problem solving Orientation Level: Disoriented to;Place;Time;Situation   Memory: Decreased short-term memory Following Commands: Follows one step commands inconsistently;Follows one step commands with increased time Safety/Judgement: Decreased awareness of safety;Decreased awareness of deficits Awareness: Intellectual Problem Solving: Slow processing;Decreased initiation;Difficulty sequencing;Requires verbal cues;Requires tactile cues General Comments: Pt was able to state where she was accurately, but moments later asked "Where am I?"    General Comments       Exercises       Shoulder Instructions      Home Living Family/patient expects to be discharged to:: Private residence Living Arrangements: Alone;Other (Comment) (PCA and family provide 24/7 assistance) Available Help at Discharge: Family;Personal care attendant;Available 24 hours/day Type of Home: House Home Access: Ramped entrance     Home Layout: One level     Bathroom Shower/Tub: Producer, television/film/video: Standard     Home Equipment: Environmental consultant - 2 wheels;Cane - single point;Wheelchair - manual;Bedside commode;Grab bars - tub/shower (per son's report WC is old and broken down)   Additional Comments: No family present to provide home information - information here is from previous admissions and PT eval  Lives With: Family    Prior Functioning/Environment Level of Independence: Needs assistance  Gait / Transfers Assistance Needed: Uses RW for household ambulation. Sons and aide have been more closely supervising ambulation with RW lately because she has been falling. Pt also reports that she  has had many falls.  ADL's / Homemaking Assistance Needed: PCA assists with all ADLs and IADLs (according to pt)        OT Diagnosis: Generalized weakness;Cognitive deficits;Disturbance of vision;Acute pain   OT Problem List: Decreased strength;Decreased range of motion;Impaired balance (sitting and/or standing);Decreased activity tolerance;Decreased coordination;Decreased cognition;Impaired vision/perception;Decreased safety awareness;Decreased knowledge of use of DME or AE;Pain   OT Treatment/Interventions: Self-care/ADL training;Therapeutic exercise;Energy conservation;DME and/or AE instruction;Therapeutic activities;Cognitive remediation/compensation;Patient/family education;Balance training    OT Goals(Current goals can be found in the care plan section) Acute Rehab OT Goals Patient Stated Goal: unable to state OT Goal Formulation: Patient unable to participate in goal setting Time For Goal Achievement: 08/17/15 Potential to Achieve Goals: Fair ADL Goals Pt Will Perform Grooming: with supervision;standing Pt Will Perform Upper Body Bathing: with set-up;sitting Pt Will Perform Lower Body Bathing: with set-up;sit to/from stand Pt Will Transfer to Toilet: with supervision;ambulating;bedside commode Pt Will Perform Toileting - Clothing Manipulation and hygiene: with supervision;sit to/from stand Pt Will Perform Tub/Shower Transfer: Shower transfer;with min guard assist;ambulating;rolling walker;grab bars;shower seat  OT Frequency: Min 2X/week   Barriers to D/C:            Co-evaluation              End of Session Equipment Utilized During Treatment: Gait belt;Rolling walker Nurse Communication: Mobility status  Activity Tolerance: Patient tolerated treatment well Patient left: in chair;with call bell/phone within reach;with nursing/sitter in room   Time: 1211-1238 OT Time Calculation (min): 27 min Charges:  OT General Charges $OT Visit: 1 Procedure OT Evaluation $OT  Eval Moderate Complexity: 1 Procedure OT Treatments $Self Care/Home Management : 8-22 mins G-Codes:    Nils Pyle, OTR/L Pager: [phone] 08/03/2015, 1:46 PM

## 2015-08-03 NOTE — Progress Notes (Signed)
CSW submitted for Level II PASRR and Pt is under manual review.   CSW will follow up with additional information in the a.m.     CSW will continue to follow Pt for d/c planning.    [name] LCSWA  [facility]  [phone]

## 2015-08-03 NOTE — Care Management Important Message (Signed)
Important Message  Patient Details  Name: [name] MRN: [id number] Date of Birth: Jun 08, 1921   Medicare Important Message Given:  Yes    Kyla Balzarine 08/03/2015, 3:54 PM

## 2015-08-04 LAB — CULTURE, BLOOD (ROUTINE X 2)
CULTURE: NO GROWTH
Culture: NO GROWTH

## 2015-08-04 MED ORDER — MEGESTROL ACETATE 400 MG/10ML PO SUSP
400.0000 mg | Freq: Every day | ORAL | Status: DC
  Administered 2015-08-04 – 2015-08-05 (×2): 400 mg via ORAL
  Filled 2015-08-04 (×3): qty 10

## 2015-08-04 MED ORDER — MEGESTROL ACETATE 400 MG/10ML PO SUSP: 400.0000 mg | Freq: Every day | ORAL | Status: DC

## 2015-08-04 MED ORDER — MEGESTROL ACETATE 40 MG PO TABS: 320.0000 mg | ORAL_TABLET | Freq: Every day | ORAL | Status: DC

## 2015-08-04 NOTE — Progress Notes (Addendum)
[facility] PLAN OF CARE NOTE  Patient: [name]   BDZ:[id number]  PCP: Michiel Sites, MD DOB: December 23, 1920  DOA: 07/29/2015    DOS: 08/04/2015   Plan of care: Patient felt stable to be discharged on 08/03/2015. Continues to remain stable be discharged. Patient was able to tell me her date of birth and name of her grand son who was at her bedside and name of the city she is in now. She told me she was not hungry as she had a good breakfast this AM. She did not relay any new complains to me. As per her son who was at bedside, "she is doing outstanding compare to what she was 1 week ago." Waiting for PASRR number, is currently under manual review.  Hypernatremia. Stable  Poor oral intake, prolonged grief phase. Recently had a death in the family. Added mirtazapine and Zoloft for a combination treatment. Also add Megace.  Author: Lynden Oxford, MD [facility] Pager: [phone] 08/04/2015 10:07 AM   If 7PM-7AM, please contact night-coverage at [email or web address], password [facility]

## 2015-08-04 NOTE — Progress Notes (Signed)
Bed Offers given to the Pt's daughter.   Daughter is refusing to make a decision with current bed offers. CSW contacted [facility] and they do not currently have any beds to offer.   CSW faxed to [facility] and left a message for them to review information.   Daughter was informed that she would need to make a decision today.   Daughter was still refusing to make a choice unless more offers come in.   CSW will call [facility] again and other family preference to see if Pt can get another choice.  Case Manager aware.   [name] LCSWA  [facility] [phone]

## 2015-08-04 NOTE — Progress Notes (Signed)
CSW spoke with Neysa Bonito at [facility] and they will be able to accept the Pt tomorrow.   CSW provided Pt's daughters information to the facility rep and she will call to schedule a time to meet with the Pts daughter for paperwork.   [name] LCSWA  [facility] [phone]

## 2015-08-04 NOTE — NC FL2 (Signed)
[place] MEDICAID FL2 LEVEL OF CARE SCREENING TOOL     IDENTIFICATION  Patient Name: [name] Birthdate: [birth year] Sex: female Admission Date (Current Location): 07/29/2015  [facility] and [place] Number:  Producer, television/film/video and Address:  [facility], [address] [place], [place] [address]      Provider Number: [id number]  Attending Physician Name and Address:  Rolly Salter, MD  Relative Name and Phone Number:  Barrett Henle     Current Level of Care: Hospital Recommended Level of Care: Skilled Nursing Facility Prior Approval Number:    Date Approved/Denied:   PASRR Number:    Discharge Plan: SNF    Current Diagnoses: Patient Active Problem List   Diagnosis Date Noted  . Anemia due to poor nutrition 08/03/2015  . 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 Depressed 08/03/2015  . Cardiac pacemaker in situ 08/03/2015  . Hypernatremia 08/03/2015  . Altered mental status 07/30/2015  . Elevated lactic acid level 07/30/2015  . Delirium 07/30/2015  . Edema   . Acute CHF (congestive heart failure) (HCC) 06/24/2015  . CHF exacerbation (HCC) 06/24/2015  . Abscess of chest wall 04/26/2015  . GERD (gastroesophageal reflux disease) 04/26/2015  . Hyperkalemia 04/19/2015  . Acute renal failure superimposed on stage 3 chronic kidney disease (HCC) 04/19/2015  . Pacemaker infection (HCC) 04/14/2015  . AV block, 3rd degree (HCC)   . Bradycardia   . Symptomatic bradycardia 02/23/2015  . CAD (coronary artery disease) of bypass graft, along with subtotal left main occlusion and and RCA occlusion - graft dependent 08/03/2013  . Dermatophytosis of nail   . Obesity (BMI 30-39.9) 02/15/2013  . Ischemic cardiomyopathy, EF 45-50% 2D 03/13/12 03/15/2012  . STEMI, 03/10/12 Rx'd with PCI with recurrance 03/11/12 - redo PCI of SVG-RCA 03/13/2012  . S/P CABG x 3, LIMA - LAD, SVG-OM, SVG- PDA 08/05/11 08/05/2011    Class: Hospitalized for  . DM (diabetes  mellitus), type 2 with renal complications (HCC) 05/28/2011  . History of gout 05/28/2011  . Hypothyroidism 05/27/2011  . Inf STEMI #1 -PTCA to RCA (05/2011) + LM 80;NSTEMI (07/2011) - CABG x3;  inf STEMI #2 -- SVG-RCA  PCI with DES X 3 on 03/10/12; with re-occlusion 24hrs later (inferior STEMI #3) - redo thrombectomy/PTCA 05/26/2011  . Hypertensive heart disease with CHF (congestive heart failure) (HCC) 05/26/2011  . Dyslipidemia with statin intolerance 05/26/2011    Orientation RESPIRATION BLADDER Height & Weight    Self, Time, Situation, Place  Normal Incontinent   137 lbs.  BEHAVIORAL SYMPTOMS/MOOD NEUROLOGICAL BOWEL NUTRITION STATUS      Continent Diet  AMBULATORY STATUS COMMUNICATION OF NEEDS Skin   Limited Assist Verbally Normal                       Personal Care Assistance Level of Assistance  Dressing, Feeding, Bathing Bathing Assistance: Limited assistance Feeding assistance: Limited assistance Dressing Assistance: Limited assistance Total Care Assistance: Limited assistance   Functional Limitations Info             SPECIAL CARE FACTORS FREQUENCY  OT (By licensed OT)       OT Frequency: minimum of 2xs per week            Contractures Contractures Info: Not present    Additional Factors Info  Code Status, Allergies, Psychotropic (O2   ) Code Status Info: DNR Allergies Info: Statins, Tape  Psychotropic Info: AMS Insulin Sliding Scale Info:  (  not applicable )       Current Medications (08/04/2015):  This is the current hospital active medication list Current Facility-Administered Medications  Medication Dose Route Frequency Provider Last Rate Last Dose  . acetaminophen (TYLENOL) tablet 650 mg  650 mg Oral Q6H PRN Alberteen Sam, MD   650 mg at 08/04/15 1333   Or  . acetaminophen (TYLENOL) suppository 650 mg  650 mg Rectal Q6H PRN Alberteen Sam, MD      . allopurinol (ZYLOPRIM) tablet 100 mg  100 mg Oral Daily Alberteen Sam,  MD   100 mg at 08/04/15 1107  . carvedilol (COREG) tablet 6.25 mg  6.25 mg Oral BID WC Alberteen Sam, MD   6.25 mg at 08/04/15 0820  . colesevelam [facility]) tablet 1,875 mg  1,875 mg Oral BID WC Alberteen Sam, MD   1,875 mg at 08/04/15 1108  . ferrous sulfate tablet 325 mg  325 mg Oral Q breakfast Rolly Salter, MD   325 mg at 08/04/15 0820  . FLUoxetine (PROZAC) capsule 10 mg  10 mg Oral Daily Rolly Salter, MD   10 mg at 08/04/15 1110  . folic acid (FOLVITE) tablet 1 mg  1 mg Oral Daily Rolly Salter, MD   1 mg at 08/04/15 1110  . levothyroxine (SYNTHROID, LEVOTHROID) tablet 100 mcg  100 mcg Oral QAC breakfast Alberteen Sam, MD   100 mcg at 08/04/15 0820  . megestrol (MEGACE) 400 MG/10ML suspension 400 mg  400 mg Oral Daily Rolly Salter, MD   400 mg at 08/04/15 1111  . mirtazapine (REMERON) tablet 15 mg  15 mg Oral QHS Rolly Salter, MD   15 mg at 08/04/15 0026  . pantoprazole (PROTONIX) EC tablet 40 mg  40 mg Oral Daily Alberteen Sam, MD   40 mg at 08/04/15 1108  . polyethylene glycol (MIRALAX / GLYCOLAX) packet 17 g  17 g Oral Daily Alberteen Sam, MD   17 g at 08/04/15 1112  . ticagrelor (BRILINTA) tablet 90 mg  90 mg Oral BID Alberteen Sam, MD   90 mg at 08/04/15 1128     Discharge Medications: Please see discharge summary for a list of discharge medications.  Relevant Imaging Results:  Relevant Lab Results:   Additional Information SSN:  [id number]  [name]

## 2015-08-04 NOTE — Progress Notes (Signed)
Pt is alert and oriented to self, time and situation on assessment at this time

## 2015-08-04 NOTE — Care Management (Addendum)
Patient's daughter Barrett Henle not at bedside at present . Called left message regarding An Important Message from Medicare About Your Rights .  Left copy at patient's bedside.  Left my direct number on voice mail.   Ronny Flurry RN [phone]

## 2015-08-04 NOTE — Care Management Important Message (Signed)
Important Message  Patient Details  Name: [name] MRN: [id number] Date of Birth: 08/17/1920   Medicare Important Message Given:  Yes    Kingsley Plan, RN 08/04/2015, 3:45 PM

## 2015-08-04 NOTE — Progress Notes (Signed)
Physical Therapy Treatment Patient Details Name: [name] MRN: [id number] DOB: [birth year] Today's Date: 08/04/2015    History of Present Illness 82 y.o. female admitted to [facility] on 07/29/15 for increased confusion acute encephalopathy possibly triggered by dehydration, hypernatremia, acute kidney injury, and constipation.  Pt with significant PMHx of STEMI, s/p CABG, A-fib, ischemic cardiomyopathy, DM, HTN, gout, LE edema, CHF, delirium, pacemaker, bil THA, and multiple heart catheterizations.    PT Comments    Pt seemed to be less confused and disorientated today, and successfully ambulated 30 feet with an RW and +2 min assist for safety with the recliner following behind. Pt was orientated to person and place.    Follow Up Recommendations  SNF     Equipment Recommendations  Wheelchair (measurements PT);Wheelchair cushion (measurements PT)       Precautions / Restrictions Precautions Precautions: Fall Restrictions Weight Bearing Restrictions: No    Mobility  Bed Mobility               General bed mobility comments: Pt was received and left in the recliner.   Transfers Overall transfer level: Needs assistance Equipment used: Rolling walker (2 wheeled) Transfers: Sit to/from Stand Sit to Stand: Min assist;+2 safety/equipment Stand pivot transfers: Min assist       General transfer comment: Pt requires min assist to transfer sit to stand due to weakness and fatigue of legs.  Ambulation/Gait Ambulation/Gait assistance: Min guard;+2 safety/equipment Ambulation Distance (Feet): 30 Feet Assistive device: Rolling walker (2 wheeled) Gait Pattern/deviations: Step-through pattern;Antalgic;Decreased stride length Gait velocity: decreased   General Gait Details: Notable genu valgum and curvature of spine. Pt able to ambulate with RW and +2 min assist for safety (one to follow with recliner).                           Balance Overall balance  assessment: Needs assistance Sitting-balance support: Feet supported;Bilateral upper extremity supported Sitting balance-Leahy Scale: Fair Sitting balance - Comments: Pt may be able to maintain sitting balance without bilateral UE support; however, when sitting, Pt always has bilateral UE supported on armrests.   Standing balance support: Bilateral upper extremity supported Standing balance-Leahy Scale: Fair Standing balance comment: Pt relies on bilateral UE support. Pt reports her legs feel weak when standing and walking compared to when she lived at home.                     Cognition Arousal/Alertness: Awake/alert Behavior During Therapy: WFL for tasks assessed/performed Overall Cognitive Status: History of cognitive impairments - at baseline   Orientation Level:  (Pt was oriented )     Following Commands: Follows one step commands consistently   Awareness: Emergent Problem Solving: Slow processing;Requires verbal cues General Comments: Pt was more aware today than during last session (07/31/15). Pt was orientated to person and place throughout session and was able to follow commands, but required verbal cueing for completion.           Pertinent Vitals/Pain Pain Assessment: No/denies pain Pain Location: Pt commented she had a headache earlier in the day, but it had resolved. Pain Intervention(s): Limited activity within patient's tolerance;Monitored during session           PT Goals (current goals can now be found in the care plan section) Acute Rehab PT Goals PT Goal Formulation: With family Time For Goal Achievement: 08/14/15 Potential to Achieve Goals: Fair Progress towards PT goals: Progressing toward goals  Frequency  Min 2X/week    PT Plan Current plan remains appropriate       End of Session Equipment Utilized During Treatment: Gait belt Activity Tolerance: Patient limited by fatigue Patient left: in chair;with call bell/phone within reach;with  chair alarm set     Time: 4492-0100 PT Time Calculation (min) (ACUTE ONLY): 19 min  Charges:    1 gait                     [place], [place] [phone] office Chase Picket 08/04/2015, 2:59 PM

## 2015-08-04 NOTE — Progress Notes (Signed)
CSW provided list of bed offers for SNF facilities.   Pt/Family prefers:  Not chosen at this time.  CSW will contact the facility when chosen for d/c planning.     [name] LCSWA  [facility]  [phone]

## 2015-08-04 NOTE — Progress Notes (Signed)
650 mg po tylenol administered for c/o a headache 

## 2015-08-05 DIAGNOSIS — R5381 Other malaise: Secondary | ICD-10-CM | POA: Diagnosis not present

## 2015-08-05 DIAGNOSIS — F419 Anxiety disorder, unspecified: Secondary | ICD-10-CM | POA: Diagnosis not present

## 2015-08-05 DIAGNOSIS — I2510 Atherosclerotic heart disease of native coronary artery without angina pectoris: Secondary | ICD-10-CM | POA: Diagnosis not present

## 2015-08-05 DIAGNOSIS — N183 Chronic kidney disease, stage 3 (moderate): Secondary | ICD-10-CM | POA: Diagnosis not present

## 2015-08-05 DIAGNOSIS — D508 Other iron deficiency anemias: Secondary | ICD-10-CM | POA: Diagnosis not present

## 2015-08-05 DIAGNOSIS — I442 Atrioventricular block, complete: Secondary | ICD-10-CM | POA: Diagnosis not present

## 2015-08-05 DIAGNOSIS — R680 Hypothermia, not associated with low environmental temperature: Secondary | ICD-10-CM | POA: Diagnosis present

## 2015-08-05 DIAGNOSIS — R404 Transient alteration of awareness: Secondary | ICD-10-CM | POA: Diagnosis not present

## 2015-08-05 DIAGNOSIS — E11649 Type 2 diabetes mellitus with hypoglycemia without coma: Secondary | ICD-10-CM | POA: Diagnosis not present

## 2015-08-05 DIAGNOSIS — R531 Weakness: Secondary | ICD-10-CM | POA: Diagnosis not present

## 2015-08-05 DIAGNOSIS — Z9181 History of falling: Secondary | ICD-10-CM | POA: Diagnosis not present

## 2015-08-05 DIAGNOSIS — E039 Hypothyroidism, unspecified: Secondary | ICD-10-CM | POA: Diagnosis not present

## 2015-08-05 DIAGNOSIS — T502X5A Adverse effect of carbonic-anhydrase inhibitors, benzothiadiazides and other diuretics, initial encounter: Secondary | ICD-10-CM | POA: Diagnosis not present

## 2015-08-05 DIAGNOSIS — D539 Nutritional anemia, unspecified: Secondary | ICD-10-CM | POA: Diagnosis not present

## 2015-08-05 DIAGNOSIS — J9811 Atelectasis: Secondary | ICD-10-CM | POA: Diagnosis not present

## 2015-08-05 DIAGNOSIS — M6281 Muscle weakness (generalized): Secondary | ICD-10-CM | POA: Diagnosis not present

## 2015-08-05 DIAGNOSIS — F05 Delirium due to known physiological condition: Secondary | ICD-10-CM | POA: Diagnosis not present

## 2015-08-05 DIAGNOSIS — I25810 Atherosclerosis of coronary artery bypass graft(s) without angina pectoris: Secondary | ICD-10-CM | POA: Diagnosis not present

## 2015-08-05 DIAGNOSIS — E860 Dehydration: Secondary | ICD-10-CM | POA: Diagnosis not present

## 2015-08-05 DIAGNOSIS — I5033 Acute on chronic diastolic (congestive) heart failure: Secondary | ICD-10-CM | POA: Diagnosis not present

## 2015-08-05 DIAGNOSIS — Z955 Presence of coronary angioplasty implant and graft: Secondary | ICD-10-CM | POA: Diagnosis not present

## 2015-08-05 DIAGNOSIS — T68XXXD Hypothermia, subsequent encounter: Secondary | ICD-10-CM | POA: Diagnosis not present

## 2015-08-05 DIAGNOSIS — T68XXXA Hypothermia, initial encounter: Secondary | ICD-10-CM | POA: Diagnosis not present

## 2015-08-05 DIAGNOSIS — R4182 Altered mental status, unspecified: Secondary | ICD-10-CM | POA: Diagnosis present

## 2015-08-05 DIAGNOSIS — Z7982 Long term (current) use of aspirin: Secondary | ICD-10-CM | POA: Diagnosis not present

## 2015-08-05 DIAGNOSIS — G9340 Encephalopathy, unspecified: Secondary | ICD-10-CM | POA: Diagnosis not present

## 2015-08-05 DIAGNOSIS — Z66 Do not resuscitate: Secondary | ICD-10-CM | POA: Diagnosis not present

## 2015-08-05 DIAGNOSIS — E1121 Type 2 diabetes mellitus with diabetic nephropathy: Secondary | ICD-10-CM | POA: Diagnosis not present

## 2015-08-05 DIAGNOSIS — I5043 Acute on chronic combined systolic (congestive) and diastolic (congestive) heart failure: Secondary | ICD-10-CM | POA: Diagnosis not present

## 2015-08-05 DIAGNOSIS — Z515 Encounter for palliative care: Secondary | ICD-10-CM | POA: Diagnosis not present

## 2015-08-05 DIAGNOSIS — R52 Pain, unspecified: Secondary | ICD-10-CM | POA: Diagnosis not present

## 2015-08-05 DIAGNOSIS — I252 Old myocardial infarction: Secondary | ICD-10-CM | POA: Diagnosis not present

## 2015-08-05 DIAGNOSIS — A419 Sepsis, unspecified organism: Secondary | ICD-10-CM | POA: Diagnosis not present

## 2015-08-05 DIAGNOSIS — M109 Gout, unspecified: Secondary | ICD-10-CM | POA: Diagnosis not present

## 2015-08-05 DIAGNOSIS — M7989 Other specified soft tissue disorders: Secondary | ICD-10-CM | POA: Diagnosis not present

## 2015-08-05 DIAGNOSIS — M1990 Unspecified osteoarthritis, unspecified site: Secondary | ICD-10-CM | POA: Diagnosis not present

## 2015-08-05 DIAGNOSIS — K219 Gastro-esophageal reflux disease without esophagitis: Secondary | ICD-10-CM | POA: Diagnosis not present

## 2015-08-05 DIAGNOSIS — F0390 Unspecified dementia without behavioral disturbance: Secondary | ICD-10-CM | POA: Diagnosis not present

## 2015-08-05 DIAGNOSIS — I110 Hypertensive heart disease with heart failure: Secondary | ICD-10-CM | POA: Diagnosis not present

## 2015-08-05 DIAGNOSIS — Z79899 Other long term (current) drug therapy: Secondary | ICD-10-CM | POA: Diagnosis not present

## 2015-08-05 DIAGNOSIS — Z950 Presence of cardiac pacemaker: Secondary | ICD-10-CM | POA: Diagnosis not present

## 2015-08-05 DIAGNOSIS — I255 Ischemic cardiomyopathy: Secondary | ICD-10-CM | POA: Diagnosis not present

## 2015-08-05 DIAGNOSIS — J690 Pneumonitis due to inhalation of food and vomit: Secondary | ICD-10-CM | POA: Diagnosis not present

## 2015-08-05 DIAGNOSIS — N179 Acute kidney failure, unspecified: Secondary | ICD-10-CM | POA: Diagnosis not present

## 2015-08-05 DIAGNOSIS — N184 Chronic kidney disease, stage 4 (severe): Secondary | ICD-10-CM | POA: Diagnosis not present

## 2015-08-05 DIAGNOSIS — I130 Hypertensive heart and chronic kidney disease with heart failure and stage 1 through stage 4 chronic kidney disease, or unspecified chronic kidney disease: Secondary | ICD-10-CM | POA: Diagnosis not present

## 2015-08-05 DIAGNOSIS — E1122 Type 2 diabetes mellitus with diabetic chronic kidney disease: Secondary | ICD-10-CM | POA: Diagnosis not present

## 2015-08-05 DIAGNOSIS — Z79818 Long term (current) use of other agents affecting estrogen receptors and estrogen levels: Secondary | ICD-10-CM | POA: Diagnosis not present

## 2015-08-05 DIAGNOSIS — F329 Major depressive disorder, single episode, unspecified: Secondary | ICD-10-CM | POA: Diagnosis not present

## 2015-08-05 DIAGNOSIS — E870 Hyperosmolality and hypernatremia: Secondary | ICD-10-CM | POA: Diagnosis not present

## 2015-08-05 DIAGNOSIS — E871 Hypo-osmolality and hyponatremia: Secondary | ICD-10-CM | POA: Diagnosis not present

## 2015-08-05 DIAGNOSIS — R278 Other lack of coordination: Secondary | ICD-10-CM | POA: Diagnosis not present

## 2015-08-05 DIAGNOSIS — R1310 Dysphagia, unspecified: Secondary | ICD-10-CM | POA: Diagnosis not present

## 2015-08-05 DIAGNOSIS — R339 Retention of urine, unspecified: Secondary | ICD-10-CM | POA: Diagnosis not present

## 2015-08-05 DIAGNOSIS — R2681 Unsteadiness on feet: Secondary | ICD-10-CM | POA: Diagnosis not present

## 2015-08-05 MED ORDER — LORAZEPAM 0.5 MG PO TABS
0.5000 mg | ORAL_TABLET | Freq: Three times a day (TID) | ORAL | Status: DC | PRN
  Administered 2015-08-05: 0.5 mg via ORAL
  Filled 2015-08-05: qty 1

## 2015-08-05 MED ORDER — LORAZEPAM 0.5 MG PO TABS: 0.5000 mg | ORAL_TABLET | Freq: Three times a day (TID) | ORAL | Status: DC | PRN

## 2015-08-05 NOTE — Clinical Social Work Placement (Signed)
   CLINICAL SOCIAL WORK PLACEMENT  NOTE  Date:  08/05/2015  Patient Details  Name: [name] MRN: [id number] Date of Birth: September 18, 1920  Clinical Social Work is seeking post-discharge placement for this patient at the Skilled  Nursing Facility level of care (*CSW will initial, date and re-position this form in  chart as items are completed):  Yes   Patient/family provided with [facility] Clinical Social Work Department's list of facilities offering this level of care within the geographic area requested by the patient (or if unable, by the patient's family).  Yes   Patient/family informed of their freedom to choose among providers that offer the needed level of care, that participate in Medicare, Medicaid or managed care program needed by the patient, have an available bed and are willing to accept the patient.  Yes   Patient/family informed of 's ownership interest in [facility] and [facility], as well as of the fact that they are under no obligation to receive care at these facilities.  PASRR submitted to EDS on 08/03/15     PASRR number received on       Existing PASRR number confirmed on       FL2 transmitted to all facilities in geographic area requested by pt/family on 08/03/15     FL2 transmitted to all facilities within larger geographic area on       Patient informed that his/her managed care company has contracts with or will negotiate with certain facilities, including the following:        No   Patient/family informed of bed offers received.  Patient chooses bed at  [facility])     Physician recommends and patient chooses bed at      Patient to be transferred to  [facility] ) on 08/05/15.  Patient to be transferred to facility by  [facility])     Patient family notified on 08/05/15 of transfer.  Name of family member notified:  Daughter Barrett Henle      PHYSICIAN       Additional Comment:     _______________________________________________ Loleta Dicker, LCSW 08/05/2015, 1:09 PM

## 2015-08-05 NOTE — Progress Notes (Signed)
Patient for discharge to [facility], report given to [place], [place]. Awaiting transport.

## 2015-08-05 NOTE — H&P (Deleted)
REport given to carowind, RN

## 2015-08-05 NOTE — Progress Notes (Signed)
Discharge to [facility] place transported by EMS. Patient is alert X2, not in any distress.

## 2015-08-05 NOTE — Progress Notes (Signed)
CSW spoke with facility representative Christy from [facility]. Patient has been accepted to their facility. Per Neysa Bonito, a facility representative will complete paperwork with daughter this morning. Patient can arrive to facility at anytime. CSW will continue to follow and provide support while in hospital.   Fernande Boyden, [facility] Clinical Social Worker [facility] Ph: [phone]

## 2015-08-06 ENCOUNTER — Encounter: Payer: Self-pay | Admitting: Internal Medicine

## 2015-08-06 ENCOUNTER — Non-Acute Institutional Stay (SKILLED_NURSING_FACILITY): Payer: Medicare Other | Admitting: Internal Medicine

## 2015-08-06 DIAGNOSIS — R5381 Other malaise: Secondary | ICD-10-CM

## 2015-08-06 DIAGNOSIS — I25810 Atherosclerosis of coronary artery bypass graft(s) without angina pectoris: Secondary | ICD-10-CM | POA: Diagnosis not present

## 2015-08-06 DIAGNOSIS — I110 Hypertensive heart disease with heart failure: Secondary | ICD-10-CM | POA: Diagnosis not present

## 2015-08-06 DIAGNOSIS — R630 Anorexia: Secondary | ICD-10-CM | POA: Diagnosis not present

## 2015-08-06 DIAGNOSIS — K5900 Constipation, unspecified: Secondary | ICD-10-CM

## 2015-08-06 DIAGNOSIS — E46 Unspecified protein-calorie malnutrition: Secondary | ICD-10-CM | POA: Diagnosis not present

## 2015-08-06 DIAGNOSIS — F0390 Unspecified dementia without behavioral disturbance: Secondary | ICD-10-CM

## 2015-08-06 DIAGNOSIS — D509 Iron deficiency anemia, unspecified: Secondary | ICD-10-CM

## 2015-08-06 DIAGNOSIS — E038 Other specified hypothyroidism: Secondary | ICD-10-CM

## 2015-08-06 DIAGNOSIS — R590 Localized enlarged lymph nodes: Secondary | ICD-10-CM | POA: Diagnosis not present

## 2015-08-06 DIAGNOSIS — E034 Atrophy of thyroid (acquired): Secondary | ICD-10-CM

## 2015-08-06 DIAGNOSIS — E870 Hyperosmolality and hypernatremia: Secondary | ICD-10-CM | POA: Diagnosis not present

## 2015-08-06 DIAGNOSIS — F329 Major depressive disorder, single episode, unspecified: Secondary | ICD-10-CM

## 2015-08-06 DIAGNOSIS — K219 Gastro-esophageal reflux disease without esophagitis: Secondary | ICD-10-CM | POA: Diagnosis not present

## 2015-08-06 DIAGNOSIS — K5909 Other constipation: Secondary | ICD-10-CM

## 2015-08-06 NOTE — Progress Notes (Signed)
Patient ID: [name], female   DOB: Jan 26, 1921, 82 y.o.   MRN: [id number]   [facility]   PCP: Michiel Sites, MD  Code Status: No Code  Allergies  Allergen Reactions  . Statins Rash  . Tape Rash    Please use paper tape    Chief Complaint  Patient presents with  . New Admit To SNF    New Admission     HPI:  82 y.o. patient is here for short term rehabilitation post hospital admission from 07/29/15-08/03/15 with altered mental status in setting of hypernatremia, progressive dementia and acute on chronic renal failure. Ct head showed no acute intracranial abnormality. She was treated with half NS. She is seen in her room today. She denies any concerns. She has flat affect. She is alert and oriented. She has PMH of CAD, CHF, hypothyroidism, pacemaker and dementia.   Review of Systems:  Constitutional: Negative for fever, chills, diaphoresis.  HENT: Negative for headache, congestion, nasal discharge Eyes: Negative for blurred vision, double vision and discharge.  Respiratory: Negative for cough, shortness of breath and wheezing.   Cardiovascular: Negative for chest pain, palpitations, leg swelling.  Gastrointestinal: Negative for heartburn, nausea, vomiting, abdominal pain. Had bowel movement this am Genitourinary: Negative for dysuria and flank pain.  Musculoskeletal: Negative for back pain, falls Skin: Negative for itching, rash.  Neurological: Negative for dizziness   Past Medical History  Diagnosis Date  . ST elevation myocardial infarction (STEMI) of inferior wall (HCC) 05/2011; 03/10/2012    a) 11/'12: 100% RCA, dLM 80% -- POBA of RCA (for planned CABG, not done until re-admission with NSTEMI 1/'13);; b) 8/'13: 100% SVG-RCA (PCI & re-PCI), native RCA 100%; Patent LIMA-p-mLAD, SVG-OM  . CAD S/P percutaneous coronary angioplasty 11/'12; 8/31 & 9/1/'13    a) MV: 100% RCA-POBA, dLM left main 80%; b) 1/'13: NSTEMI --> CABG; c) 2013: 8/31 - 100%  RCA & acute SVG-RCA, 100% SVG-OM, 90% LM, 80% p&mLAD, ~70% RI, 50% Cx --> PCI-SVG-RCA: Promus Element DES x 3 (prox 2.5 mm x 38 mm & 2.5 mm x 16 mm, distal 2.5 mm x 16 mm); on 9/1 - accute in-stent Thrombosis - Aspiration thrombectomy & PTCA  . S/P CABG x 4 08/05/11    Dr. Tyrone Sage: LIMA-p-mLAD, SVG-RPDA, SVG-OM; known  100% SVG-OM, Extensive PTCA of SVG-RCA;;  Hospital course complicated by Afib & PNA; after d/c cellulitis of SVG harvest site due to edema  . Episodic atrial fibrillation (HCC) 07/2011    Post-Op CABG  . Ischemic cardiomyopathy 03/2012    Echo: EF 45-50% - Mild basal- mid inferolateral Hypokinesis: Gr 1 DD, mildly increased PAP.  Marland Kitchen Dyslipidemia, goal LDL below 70     statin intolerance (lipitor, crestor drug reaction); Welchol & Zetia  . Hypertension, essential   . DM (diabetes mellitus), type 2 with renal complications (HCC)   . Chronic kidney disease (CKD) stage G3b/A1, moderately decreased glomerular filtration rate (GFR) between 30-44 mL/min/1.73 square meter and albuminuria creatinine ratio less than 30 mg/g   . History of pneumonia     post-op from CABG  . Hypothyroidism     on synthroid  . Headache(784.0)   . Osteoarthritis   . Anxiety     PRN Xanax   . Dermatophytosis of nail   . Gout     on Allopurinol  . Lower extremity edema     chronic  . GERD (gastroesophageal reflux disease) 04/26/2015  . Acute CHF (congestive heart failure) (HCC)  06/24/2015  . Delirium 07/30/2015  . Presence of permanent cardiac pacemaker   . Depressed 08/03/2015   Past Surgical History  Procedure Laterality Date  . Cardiac catheterization  November 2012; August and September 2013    Patent LIMA-LAD, patent she had an OM, patent stented SVG-RCA; occluded native RCA, 90% ostial left main, tandem 80% proximal and mid LAD, 70% mid Circumflex  . Coronary angioplasty  November 2012    PTCA only of RCA and setting of inferior STEMI  . Coronary artery bypass graft  08/05/2011    Procedure:  CORONARY ARTERY BYPASS GRAFTING (CABG);  Surgeon: [name], MD;  Location: [facility] OR;  Service: Open Heart Surgery;  Laterality: N/A;  coronary artery bypass graft times 4 using left internal mammary artery and right leg saphenous vein harvested endoscopically  . Joint replacement      both hips replaced  . I&d extremity  09/21/2011    Procedure: IRRIGATION AND DEBRIDEMENT EXTREMITY;  Surgeon: [name], MD;  Location: [facility] OR;  Service: Vascular;  Laterality: Right;  with wound vac placement  . Coronary angioplasty with stent placement  03/10/2012     infferior STEMI: Occluded native RCA and SVG-RCA --> thrombectomy and 3 stent placement to the SVG-RCA (2.5 mm right 38 mm and 2 proximal distal overlapping 2.5 mm x 16 mm Promus DES stents:  . Coronary angioplasty  03/11/2012    Inferior STEMI #3: Reoccluded SVG-RCA; extensive thrombectomy and post dilation PTCA  . Transthoracic echocardiogram  September 2013    EF 45-50%, mild hypokinesis of the basal and mid inferolateral wall, grade 1 diastolic dysfunction, mildly increased artery pressures.  . Left heart catheterization with coronary angiogram N/A 05/26/2011    Procedure: LEFT HEART CATHETERIZATION WITH CORONARY ANGIOGRAM;  Surgeon: Marykay Lex, MD;  Location: [facility] CATH LAB;  Service: Cardiovascular;  Laterality: N/A;  . Percutaneous coronary intervention-balloon only N/A 05/26/2011    Procedure: PERCUTANEOUS CORONARY INTERVENTION-BALLOON ONLY;  Surgeon: Marykay Lex, MD;  Location: [facility] CATH LAB;  Service: Cardiovascular;  Laterality: N/A;  . Left heart cath N/A 03/10/2012    Procedure: LEFT HEART CATH;  Surgeon: Herby Abraham, MD;  Location: [facility] CATH LAB;  Service: Cardiovascular;  Laterality: N/A;  . Percutaneous coronary stent intervention (pci-s)  03/10/2012    Procedure: PERCUTANEOUS CORONARY STENT INTERVENTION (PCI-S);  Surgeon: Herby Abraham, MD;  Location: [facility] CATH LAB;  Service: Cardiovascular;;  . Left heart  catheterization with coronary angiogram Bilateral 03/11/2012    Procedure: LEFT HEART CATHETERIZATION WITH CORONARY ANGIOGRAM;  Surgeon: Thurmon Fair, MD;  Location: [facility] CATH LAB;  Service: Cardiovascular;  Laterality: Bilateral;  . Ep implantable device N/A 02/24/2015    Procedure: Pacemaker Implant;  Surgeon: Thurmon Fair, MD;  Location: [facility] INVASIVE CV LAB;  Service: Cardiovascular;  Laterality: N/A;  . Ep implantable device N/A 04/17/2015    Procedure: Lead Extraction, Can Extraction;  Surgeon: Marinus Maw, MD;  Location: [facility] INVASIVE CV LAB;  Service: Cardiovascular;  Laterality: N/A;  . Cardiac catheterization N/A 04/17/2015    Procedure: Temporary Pacemaker;  Surgeon: Marinus Maw, MD;  Location: [facility] INVASIVE CV LAB;  Service: Cardiovascular;  Laterality: N/A;  . Ep implantable device N/A 04/21/2015    Procedure: Pacemaker Implant;  Surgeon: Marinus Maw, MD;  Location: [facility] INVASIVE CV LAB;  Service: Cardiovascular;  Laterality: N/A;  . Pacemaker lead removal  04/21/2015    Procedure: Pacemaker Lead Removal;  Surgeon: Marinus Maw, MD;  Location: [facility]  INVASIVE CV LAB;  Service: Cardiovascular;;  LV    Social History:   reports that she has never smoked. She has never used smokeless tobacco. She reports that she does not drink alcohol or use illicit drugs.  Family History  Problem Relation Age of Onset  . Breast cancer Mother   . Heart disease Father     Medications:   Medication List       This list is accurate as of: 08/06/15  3:36 PM.  Always use your most recent med list.               acetaminophen 325 MG tablet  Commonly known as:  TYLENOL  Take 650 mg by mouth 2 (two) times daily.     allopurinol 100 MG tablet  Commonly known as:  ZYLOPRIM  Take 100 mg by mouth daily.     carvedilol 6.25 MG tablet  Commonly known as:  COREG  Take 1 tablet (6.25 mg total) by mouth 2 (two) times daily with a meal.     colesevelam 625 MG tablet  Commonly known as:  WELCHOL    Take 1,875 mg by mouth See admin instructions. Take 3 tablets (1875 mg) with breakfast and with lunch     ferrous sulfate 325 (65 FE) MG tablet  Take 1 tablet (325 mg total) by mouth daily with breakfast.     FLUoxetine 10 MG capsule  Commonly known as:  PROZAC  Take 1 capsule (10 mg total) by mouth daily.     folic acid 1 MG tablet  Commonly known as:  FOLVITE  Take 1 tablet (1 mg total) by mouth daily.     furosemide 20 MG tablet  Commonly known as:  LASIX  Take 1 tablet (20 mg total) by mouth daily as needed (for leg swelling or weight gain of 2 Lbs in 1 day).     levothyroxine 100 MCG tablet  Commonly known as:  SYNTHROID, LEVOTHROID  Take 100 mcg by mouth daily.     megestrol 400 MG/10ML suspension  Commonly known as:  MEGACE  Take 10 mLs (400 mg total) by mouth daily.     mirtazapine 15 MG tablet  Commonly known as:  REMERON  Take 1 tablet (15 mg total) by mouth at bedtime.     pantoprazole 40 MG tablet  Commonly known as:  PROTONIX  Take 40 mg by mouth daily.     polyethylene glycol packet  Commonly known as:  MIRALAX / GLYCOLAX  Take 17 g by mouth daily as needed for moderate constipation.     polyvinyl alcohol 1.4 % ophthalmic solution  Commonly known as:  LIQUIFILM TEARS  Place 1 drop into both eyes 3 (three) times daily.     ticagrelor 90 MG Tabs tablet  Commonly known as:  BRILINTA  TAKE 1 TABLET (90 MG TOTAL) BY MOUTH 2 (TWO) TIMES DAILY. Resume on 03/02/15         Physical Exam Filed Vitals:   08/06/15 1519  BP: 122/71  Pulse: 61  Temp: 97.6 F (36.4 C)  TempSrc: Oral  Resp: 20  Weight: 137 lb (62.143 kg)  SpO2: 94%    General- elderly female, thin built, frail, in no acute distress Head- normocephalic, bruise to left periorbital area and left forehead Nose- no maxillary or frontal sinus tenderness, no nasal discharge Throat- moist mucus membrane Eyes- PERRLA, EOMI, no pallor, no icterus, no discharge, normal conjunctiva, normal  sclera Neck- right anterior cervical lymphadenopathy noted,  non tender, no other cervical lymphadenopathy Cardiovascular- normal s1,s2, no murmurs, trace leg edema Respiratory- bilateral clear to auscultation, no wheeze, no rhonchi, no crackles, no use of accessory muscles Abdomen- bowel sounds present, soft, non tender Musculoskeletal- able to move all 4 extremities, generalized weakness Neurological- no focal deficit, alert and oriented to person, place and time at present Skin- warm and dry Psychiatry- normal mood and affect    Labs reviewed: Basic Metabolic Panel:  Recent Labs  16/10/96 1718  02/23/15 2213  06/25/15 0041  08/01/15 1325  08/02/15 1130  08/03/15 0330 08/03/15 0658 08/03/15 1220  NA 144  < > 145  < >  --   < > 146*  < > 144  < > 144 143 143  K 4.1  < > 3.9  < >  --   < > 4.2  --  4.2  --  4.2  --   --   CL 114*  < > 112*  < >  --   < > 114*  --  115*  --  117*  --   --   CO2 22  --  24  < >  --   < > 21*  --  21*  --  24  --   --   GLUCOSE 168*  < > 124*  < >  --   < > 103*  --  57*  --  94  --   --   BUN 40*  < > 39*  < >  --   < > 27*  --  23*  --  19  --   --   CREATININE 2.63*  < > 2.53*  < >  --   < > 1.68*  --  1.52*  --  1.36*  --   --   CALCIUM 8.8*  --  9.2  < >  --   < > 9.1  --  9.0  --  9.0  --   --   MG 2.0  --  2.1  --  1.9  --   --   --   --   --   --   --   --   PHOS  --   --   --   --  2.8  --   --   --   --   --   --   --   --   < > = values in this interval not displayed. Liver Function Tests:  Recent Labs  07/29/15 2235 07/30/15 0354 08/02/15 1130  AST 18 13* 15  ALT 10* 8* 9*  ALKPHOS 151* 122 98  BILITOT 0.8 0.7 0.3  PROT 7.0 6.0* 4.7*  ALBUMIN 3.4* 2.9* 2.3*   No results for input(s): LIPASE, AMYLASE in the last 8760 hours. No results for input(s): AMMONIA in the last 8760 hours. CBC:  Recent Labs  02/23/15 1718  07/29/15 2235  08/01/15 0602 08/02/15 1130 08/03/15 0330  WBC 4.5  < > 8.2  < > 4.6 3.5* 3.8*   NEUTROABS 3.0  --  6.8  --   --  1.6*  --   HGB 9.0*  < > 11.4*  < > 8.5* 8.0* 8.8*  HCT 28.8*  < > 36.7  < > 26.5* 25.4* 28.5*  MCV 82.3  < > 81.9  < > 80.8 82.7 83.6  PLT 112*  < > 155  < > 142* 130* 136*  < > =  values in this interval not displayed. Cardiac Enzymes:  Recent Labs  02/24/15 0305 02/24/15 0959 06/25/15 0710  TROPONINI <0.03 <0.03 <0.03   BNP: Invalid input(s): POCBNP CBG:  Recent Labs  06/26/15 1139 07/29/15 2224 07/30/15 0047  GLUCAP 117* 116* 117*    Radiological Exams: Ct Head Wo Contrast  07/30/2015  CLINICAL DATA:  Altered mental status, lower abdominal pain, with anorexia. Head trauma. History of hypertension, hyperlipidemia, diabetes, chronic kidney disease. EXAM: CT HEAD WITHOUT CONTRAST TECHNIQUE: Contiguous axial images were obtained from the base of the skull through the vertex without intravenous contrast. COMPARISON:  CT head March 01, 2015 FINDINGS: Mildly motion degraded examination. The ventricles and sulci are normal for age. No intraparenchymal hemorrhage, mass effect nor midline shift. Patchy supratentorial white matter hypodensities are less than expected for patient's age and though non-specific suggest sequelae of chronic small vessel ischemic disease. No acute large vascular territory infarcts. No abnormal extra-axial fluid collections. Basal cisterns are patent. Moderate calcific atherosclerosis of the carotid siphons. No skull fracture. The included ocular globes and orbital contents are non-suspicious. Soft tissue partially opacifies the included RIGHT maxillary sinus. The mastoid air cells are well aerated. Severe LEFT temporomandibular osteoarthrosis. IMPRESSION: Negative CT head for age. Electronically Signed   By: Awilda Metro M.D.   On: 07/30/2015 03:04   Dg Chest Port 1 View  07/30/2015  CLINICAL DATA:  82 year old female with altered mental status EXAM: PORTABLE CHEST 1 VIEW COMPARISON:  Radiograph dated 06/24/2015 FINDINGS:  Single-view of the chest demonstrates mild central vascular prominence likely a degree of congestive changes. No focal consolidation, pleural effusion, or pneumothorax. Stable cardiomegaly. Right pectoral pacemaker device. No acute osseous pathology. IMPRESSION: Stable cardiomegaly with mild congestive changes. No focal consolidation. Electronically Signed   By: Elgie Collard M.D.   On: 07/30/2015 00:24     Assessment/Plan  Physical deconditioning Will have her work with physical therapy and occupational therapy team to help with gait training and muscle strengthening exercises.fall precautions. Skin care. Encourage to be out of bed.   Hypernatremia Corrected with half NS in hospital. To encourage and maintain po intake and hydration  Dementia Alert and oriented this visit, monitor clinically, encourage to be OOB. Continue assistance with ADLs as needed. Fall precautions and pressure ulcer prophylaxis.   Cervical lymphadenopathy Non tender, afebrile, monitor clinically and check cbc and cmp  HTN Stable, continue coreg 6.25 mg bid, check bp  CAD Remains chest pain free. S/p PCI x 3. Continue coreg, welchol and brilinta and monitor  Iron def anemia Monitor cbc and continue ferrous sulfate  Chronic depression Flat affect, currently on prozac and remeron, monitor clinically  Hypothyroidism Continue levothyroxine 100 mcg daily  Poor appetite Continue megace and remeron and encourage po intake  Protein calorie malnutrition Continue megace and remeron to help with her appetite, monitor po intake, get dietary consult. Monitor weight. Pressure ulcer prophylaxis  gerd Stable, continue protonix  Constipation Stable on miralax daily as needed.    Goals of care: short term rehabilitation   Labs/tests ordered: cbc with diff, cmp 08/11/15  Family/ staff Communication: reviewed care plan with patient and nursing supervisor    Oneal Grout, MD  [facility] [phone] (Monday-Friday 8 am - 5 pm) [phone] (afterhours)

## 2015-08-07 DIAGNOSIS — Z789 Other specified health status: Secondary | ICD-10-CM

## 2015-08-19 ENCOUNTER — Ambulatory Visit: Payer: Medicare Other | Admitting: Podiatry

## 2015-08-20 ENCOUNTER — Ambulatory Visit (INDEPENDENT_AMBULATORY_CARE_PROVIDER_SITE_OTHER): Payer: Medicare Other | Admitting: Cardiovascular Disease

## 2015-08-20 ENCOUNTER — Encounter: Payer: Self-pay | Admitting: Cardiovascular Disease

## 2015-08-20 ENCOUNTER — Non-Acute Institutional Stay (SKILLED_NURSING_FACILITY): Payer: Medicare Other | Admitting: Family

## 2015-08-20 VITALS — BP 94/60 | HR 70 | Ht 61.0 in | Wt 149.5 lb

## 2015-08-20 DIAGNOSIS — I25810 Atherosclerosis of coronary artery bypass graft(s) without angina pectoris: Secondary | ICD-10-CM

## 2015-08-20 DIAGNOSIS — N182 Chronic kidney disease, stage 2 (mild): Secondary | ICD-10-CM | POA: Diagnosis not present

## 2015-08-20 DIAGNOSIS — Z950 Presence of cardiac pacemaker: Secondary | ICD-10-CM

## 2015-08-20 DIAGNOSIS — E1122 Type 2 diabetes mellitus with diabetic chronic kidney disease: Secondary | ICD-10-CM

## 2015-08-20 DIAGNOSIS — I82B11 Acute embolism and thrombosis of right subclavian vein: Secondary | ICD-10-CM

## 2015-08-20 DIAGNOSIS — I442 Atrioventricular block, complete: Secondary | ICD-10-CM | POA: Diagnosis not present

## 2015-08-20 DIAGNOSIS — F32A Depression, unspecified: Secondary | ICD-10-CM

## 2015-08-20 DIAGNOSIS — I5033 Acute on chronic diastolic (congestive) heart failure: Secondary | ICD-10-CM

## 2015-08-20 DIAGNOSIS — K219 Gastro-esophageal reflux disease without esophagitis: Secondary | ICD-10-CM

## 2015-08-20 DIAGNOSIS — Z79899 Other long term (current) drug therapy: Secondary | ICD-10-CM

## 2015-08-20 DIAGNOSIS — I110 Hypertensive heart disease with heart failure: Secondary | ICD-10-CM

## 2015-08-20 DIAGNOSIS — F329 Major depressive disorder, single episode, unspecified: Secondary | ICD-10-CM | POA: Diagnosis not present

## 2015-08-20 DIAGNOSIS — D509 Iron deficiency anemia, unspecified: Secondary | ICD-10-CM | POA: Diagnosis not present

## 2015-08-20 DIAGNOSIS — E039 Hypothyroidism, unspecified: Secondary | ICD-10-CM | POA: Diagnosis not present

## 2015-08-20 DIAGNOSIS — R269 Unspecified abnormalities of gait and mobility: Secondary | ICD-10-CM | POA: Diagnosis not present

## 2015-08-20 MED ORDER — FUROSEMIDE 20 MG PO TABS: 40.0000 mg | ORAL_TABLET | Freq: Every day | ORAL | Status: DC

## 2015-08-20 MED ORDER — POTASSIUM CHLORIDE ER 10 MEQ PO TBCR: 10.0000 meq | EXTENDED_RELEASE_TABLET | Freq: Every day | ORAL | Status: DC

## 2015-08-20 MED ORDER — ASPIRIN 81 MG PO TABS: 81.0000 mg | ORAL_TABLET | Freq: Every day | ORAL | Status: AC

## 2015-08-20 NOTE — Progress Notes (Signed)
Patient ID: [name], female   DOB: 1920-10-26, 82 y.o.   MRN: [id number]     Cardiology Office Note    Date:  08/21/2015   ID:  [name], DOB 06-28-1921, MRN [id number]  PCP:  Michiel Sites, MD  Cardiologist:  Bryan Lemma, M.D.; Lewayne Bunting, M.D.; Thurmon Fair, MD   Chief Complaint  Patient presents with  . Follow-up    3 month//pt c/o SOB on minimal exertion, swelling in bilateral legs, feet, ankles    History of Present Illness:  [name] is a 82 y.o. female with a long-standing history of numerous and severe cardiac problems. She has coronary artery disease and has previously undergone bypass surgery in 2013 as well as repeated percutaneous interventions on the saphenous vein graft to the right coronary artery. She has chronic diastolic heart failure and has long-standing hypertension. She has complete heart block and is pacemaker dependent. Most recent assessment of left ventricular systolic function by echo in August 2016 shows an ejection fraction of 55-60%, although previously LVEF was estimated at about 45%.  She has had numerous health problems and has deteriorated steadily over the last several months. In August she underwent pacemaker implantation for heart block. She developed a pocket infection and the pacemaker and leads were extracted in October. The new device was implanted in the right subclavian area on October 11. On December 14 she was admitted with congestive heart failure and treated with diuretics. She was hospitalized with altered mental status related to hypernatremia on January 23. She is known rehabilitation at [facility] but has developed worsening edema. Both legs are substantially swollen all the way to her knees. Her right hand also is swollen in an asymmetrical fashion. Her current diuretic doses furosemide 20 mg daily. She weighs 12 pounds more than she did at her appointment January 26.  Past Medical History  Diagnosis  Date  . ST elevation myocardial infarction (STEMI) of inferior wall (HCC) 05/2011; 03/10/2012    a) 11/'12: 100% RCA, dLM 80% -- POBA of RCA (for planned CABG, not done until re-admission with NSTEMI 1/'13);; b) 8/'13: 100% SVG-RCA (PCI & re-PCI), native RCA 100%; Patent LIMA-p-mLAD, SVG-OM  . CAD S/P percutaneous coronary angioplasty 11/'12; 8/31 & 9/1/'13    a) MV: 100% RCA-POBA, dLM left main 80%; b) 1/'13: NSTEMI --> CABG; c) 2013: 8/31 - 100% RCA & acute SVG-RCA, 100% SVG-OM, 90% LM, 80% p&mLAD, ~70% RI, 50% Cx --> PCI-SVG-RCA: Promus Element DES x 3 (prox 2.5 mm x 38 mm & 2.5 mm x 16 mm, distal 2.5 mm x 16 mm); on 9/1 - accute in-stent Thrombosis - Aspiration thrombectomy & PTCA  . S/P CABG x 4 08/05/11    Dr. Tyrone Sage: LIMA-p-mLAD, SVG-RPDA, SVG-OM; known  100% SVG-OM, Extensive PTCA of SVG-RCA;;  Hospital course complicated by Afib & PNA; after d/c cellulitis of SVG harvest site due to edema  . Episodic atrial fibrillation (HCC) 07/2011    Post-Op CABG  . Ischemic cardiomyopathy 03/2012    Echo: EF 45-50% - Mild basal- mid inferolateral Hypokinesis: Gr 1 DD, mildly increased PAP.  Marland Kitchen Dyslipidemia, goal LDL below 70     statin intolerance (lipitor, crestor drug reaction); Welchol & Zetia  . Hypertension, essential   . DM (diabetes mellitus), type 2 with renal complications (HCC)   . Chronic kidney disease (CKD) stage G3b/A1, moderately decreased glomerular filtration rate (GFR) between 30-44 mL/min/1.73 square meter and albuminuria creatinine ratio less than 30 mg/g   .  History of pneumonia     post-op from CABG  . Hypothyroidism     on synthroid  . Headache(784.0)   . Osteoarthritis   . Anxiety     PRN Xanax   . Dermatophytosis of nail   . Gout     on Allopurinol  . Lower extremity edema     chronic  . GERD (gastroesophageal reflux disease) 04/26/2015  . Acute CHF (congestive heart failure) (HCC) 06/24/2015  . Delirium 07/30/2015  . Presence of permanent cardiac pacemaker   .  Depressed 08/03/2015    Past Surgical History  Procedure Laterality Date  . Cardiac catheterization  November 2012; August and September 2013    Patent LIMA-LAD, patent she had an OM, patent stented SVG-RCA; occluded native RCA, 90% ostial left main, tandem 80% proximal and mid LAD, 70% mid Circumflex  . Coronary angioplasty  November 2012    PTCA only of RCA and setting of inferior STEMI  . Coronary artery bypass graft  08/05/2011    Procedure: CORONARY ARTERY BYPASS GRAFTING (CABG);  Surgeon: [name], MD;  Location: [facility] OR;  Service: Open Heart Surgery;  Laterality: N/A;  coronary artery bypass graft times 4 using left internal mammary artery and right leg saphenous vein harvested endoscopically  . Joint replacement      both hips replaced  . I&d extremity  09/21/2011    Procedure: IRRIGATION AND DEBRIDEMENT EXTREMITY;  Surgeon: [name], MD;  Location: [facility] OR;  Service: Vascular;  Laterality: Right;  with wound vac placement  . Coronary angioplasty with stent placement  03/10/2012     infferior STEMI: Occluded native RCA and SVG-RCA --> thrombectomy and 3 stent placement to the SVG-RCA (2.5 mm right 38 mm and 2 proximal distal overlapping 2.5 mm x 16 mm Promus DES stents:  . Coronary angioplasty  03/11/2012    Inferior STEMI #3: Reoccluded SVG-RCA; extensive thrombectomy and post dilation PTCA  . Transthoracic echocardiogram  September 2013    EF 45-50%, mild hypokinesis of the basal and mid inferolateral wall, grade 1 diastolic dysfunction, mildly increased artery pressures.  . Left heart catheterization with coronary angiogram N/A 05/26/2011    Procedure: LEFT HEART CATHETERIZATION WITH CORONARY ANGIOGRAM;  Surgeon: Marykay Lex, MD;  Location: [facility] CATH LAB;  Service: Cardiovascular;  Laterality: N/A;  . Percutaneous coronary intervention-balloon only N/A 05/26/2011    Procedure: PERCUTANEOUS CORONARY INTERVENTION-BALLOON ONLY;  Surgeon: Marykay Lex, MD;  Location: [facility]  CATH LAB;  Service: Cardiovascular;  Laterality: N/A;  . Left heart cath N/A 03/10/2012    Procedure: LEFT HEART CATH;  Surgeon: Herby Abraham, MD;  Location: [facility] CATH LAB;  Service: Cardiovascular;  Laterality: N/A;  . Percutaneous coronary stent intervention (pci-s)  03/10/2012    Procedure: PERCUTANEOUS CORONARY STENT INTERVENTION (PCI-S);  Surgeon: Herby Abraham, MD;  Location: [facility] CATH LAB;  Service: Cardiovascular;;  . Left heart catheterization with coronary angiogram Bilateral 03/11/2012    Procedure: LEFT HEART CATHETERIZATION WITH CORONARY ANGIOGRAM;  Surgeon: Thurmon Fair, MD;  Location: [facility] CATH LAB;  Service: Cardiovascular;  Laterality: Bilateral;  . Ep implantable device N/A 02/24/2015    Procedure: Pacemaker Implant;  Surgeon: Thurmon Fair, MD;  Location: [facility] INVASIVE CV LAB;  Service: Cardiovascular;  Laterality: N/A;  . Ep implantable device N/A 04/17/2015    Procedure: Lead Extraction, Can Extraction;  Surgeon: Marinus Maw, MD;  Location: [facility] INVASIVE CV LAB;  Service: Cardiovascular;  Laterality: N/A;  . Cardiac catheterization N/A 04/17/2015  Procedure: Temporary Pacemaker;  Surgeon: Marinus Maw, MD;  Location: [facility] INVASIVE CV LAB;  Service: Cardiovascular;  Laterality: N/A;  . Ep implantable device N/A 04/21/2015    Procedure: Pacemaker Implant;  Surgeon: Marinus Maw, MD;  Location: [facility] INVASIVE CV LAB;  Service: Cardiovascular;  Laterality: N/A;  . Pacemaker lead removal  04/21/2015    Procedure: Pacemaker Lead Removal;  Surgeon: Marinus Maw, MD;  Location: [facility] INVASIVE CV LAB;  Service: Cardiovascular;;  LV     Outpatient Prescriptions Prior to Visit  Medication Sig Dispense Refill  . acetaminophen (TYLENOL) 325 MG tablet Take 650 mg by mouth 2 (two) times daily.    Marland Kitchen allopurinol (ZYLOPRIM) 100 MG tablet Take 100 mg by mouth daily.     . carvedilol (COREG) 6.25 MG tablet Take 1 tablet (6.25 mg total) by mouth 2 (two) times daily with a meal. 60 tablet 6  .  colesevelam (WELCHOL) 625 MG tablet Take 1,875 mg by mouth See admin instructions. Take 3 tablets (1875 mg) with breakfast and with lunch    . ferrous sulfate 325 (65 FE) MG tablet Take 1 tablet (325 mg total) by mouth daily with breakfast. 30 tablet 0  . FLUoxetine (PROZAC) 10 MG capsule Take 1 capsule (10 mg total) by mouth daily. 30 capsule 0  . folic acid (FOLVITE) 1 MG tablet Take 1 tablet (1 mg total) by mouth daily. 30 tablet 0  . levothyroxine (SYNTHROID, LEVOTHROID) 100 MCG tablet Take 100 mcg by mouth daily.     . megestrol (MEGACE) 400 MG/10ML suspension Take 10 mLs (400 mg total) by mouth daily. 240 mL 0  . mirtazapine (REMERON) 15 MG tablet Take 1 tablet (15 mg total) by mouth at bedtime. 30 tablet 0  . pantoprazole (PROTONIX) 40 MG tablet Take 40 mg by mouth daily.  12  . polyethylene glycol (MIRALAX / GLYCOLAX) packet Take 17 g by mouth daily as needed for moderate constipation.    . polyvinyl alcohol (LIQUIFILM TEARS) 1.4 % ophthalmic solution Place 1 drop into both eyes 3 (three) times daily.    . furosemide (LASIX) 20 MG tablet Take 1 tablet (20 mg total) by mouth daily as needed (for leg swelling or weight gain of 2 Lbs in 1 day). 30 tablet 0  . ticagrelor (BRILINTA) 90 MG TABS tablet TAKE 1 TABLET (90 MG TOTAL) BY MOUTH 2 (TWO) TIMES DAILY. Resume on 03/02/15 60 tablet 4   No facility-administered medications prior to visit.     Allergies:   Statins and Tape   Social History   Social History  . Marital Status: Widowed    Spouse Name: N/A  . Number of Children: N/A  . Years of Education: N/A   Social History Main Topics  . Smoking status: Never Smoker   . Smokeless tobacco: Never Used  . Alcohol Use: No  . Drug Use: No  . Sexual Activity: No   Other Topics Concern  . 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 She is very active up  and around the house, does not do routine exercise.   Does not smoke or drink.     Family History:  The patient's family history includes Breast cancer in her mother; Heart disease in her father.   ROS:   Please see the history of present illness.    ROS  All other systems reviewed and are negative.   PHYSICAL EXAM:   VS:  BP 94/60 mmHg  Pulse 70  Ht 5\' 1"  (1.549 m)  Wt 67.813 kg (149 lb 8 oz)  BMI 28.26 kg/m2   GEN: Well nourished, well developed, in no acute distress HEENT: normal Neck: 6-8 centimeters elevation in jugular venous pulsations, no carotid bruits, or masses Cardiac: RRR; no murmurs, rubs, or gallops, right upper extremity shows 2+ fairly soft edema over the hand, while the left hand is not swollen; there is 3-4+ pitting edema of both legs almost to the knee bilaterally Respiratory:  clear to auscultation bilaterally, normal work of breathing; both the left (explanted) and right pacemaker surgical sites appear healthy without redness, drainage or swelling GI: soft, nontender, nondistended, + BS MS: no deformity or atrophy Skin: warm and dry, no rash Neuro:  Alert and Oriented x 1, to self and daughter, Strength and sensation are intact Psych: She is very quiet and appears confused  Wt Readings from Last 3 Encounters:  08/20/15 67.813 kg (149 lb 8 oz)  08/20/15 65.545 kg (144 lb 8 oz)  08/06/15 62.143 kg (137 lb)      Studies/Labs Reviewed:   EKG:  EKG is not ordered today.    Recent Labs: 06/24/2015: B Natriuretic Peptide 841.3* 06/25/2015: Magnesium 1.9; TSH 4.481 08/02/2015: ALT 9* 08/03/2015: BUN 19; Creatinine, Ser 1.36*; Hemoglobin 8.8*; Platelets 136*; Potassium 4.2; Sodium 143   Lipid Panel    Component Value Date/Time   CHOL 169 07/31/2011 0535   TRIG 64 07/31/2011 0535   HDL 68 07/31/2011 0535   CHOLHDL 2.5 07/31/2011 0535   VLDL 13 07/31/2011 0535   LDLCALC 88 07/31/2011 0535    Additional studies/ records that were reviewed today include:   Records of recent hospitalizations    ASSESSMENT:    1. Acute on chronic diastolic CHF (congestive heart failure) (HCC)   2. AV block, 3rd degree (HCC)   3. Atherosclerosis of coronary artery bypass graft of native heart without angina pectoris   4. Hypertensive heart disease with CHF (congestive heart failure) (HCC)   5. Type 2 diabetes mellitus with stage 2 chronic kidney disease, without long-term current use of insulin (HCC)   6. Cardiac pacemaker in situ   7. Medication management      PLAN:  In order of problems listed above:  1. CHF: She is clearly hypervolemic. Roughly 12 pound fluid excess by weights. She has had problems with electrolyte imbalance with recent treatment. Increase furosemide to 40 mg daily and start a potassium supplement. Recheck labs in 1 week. Follow up in the clinic in 3 weeks. 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 Some collateral vein formation is seen in her anterior chest. She should try to keep that arm elevated as much as possible. 2. CHB: She is pacemaker dependent. Ventricular pacing may be contributing to her heart failure, but she is not a good candidate for cardiac resynchronization therapy 3. CAD: She has severe native coronary artery disease as well as occlusion of the saphenous vein graft to the right coronary artery which was treated on 3 separate occasions for thrombosis. Currently does not complain of angina pectoris. 3 years have passed since her last revascularization procedure and will stop her Brilinta. 4. HTN: Her blood pressure is relatively low today, limiting therapeutic options. Thankfully she is not symptomatic 5. DM and CKD: Recheck labs periodically  6. PPM: The swelling she had at the pacemaker site was a hematoma: it is completely gone and the site looks healthy without signs of infection. On the other hand, suspect that she has occlusion of the right  subclavian vein. Normal device function. She has 100% ventricular pacing and 42% atrial pacing. She does not have episodes of ventricular tachycardia or mode switch.    Medication Adjustments/Labs and Tests Ordered: Current medicines are reviewed at length with the patient today.  Concerns regarding medicines are outlined above.  Medication changes, Labs and Tests ordered today are listed in the Patient Instructions below. Patient Instructions  Your physician has recommended you make the following change in your medication:   STOP BRILINTA  START ASPIRIN 81 MG DAILY  INCREASE FUROSEMIDE TO 40 MG DAILY  START POTASSIUM 10 MEQ DAILY  Your physician recommends that you return for lab work in: ONE WEEK   Your physician recommends that you schedule a follow-up appointment in: 3 WEEKS WITH AN EXTENDER   KEEP LEGS AND RIGHT ARM ELEVATED AS MUCH AS POSSIBLE   Your physician recommends that you schedule a follow-up appointment in: 2 MONTHS WITH DR. HARDING  Remote monitoring is used to monitor your Pacemaker from home. This monitoring reduces the number of office visits required to check your device to one time per year. It allows [place] to monitor the functioning of your device to ensure it is working properly. You are scheduled for a device check from home on Nov 18, 2015. You may send your transmission at any time that day. If you have a wireless device, the transmission will be sent automatically. After your physician reviews your transmission, you will receive a postcard with your next transmission date.  Dr. Royann Shivers recommends that you schedule a follow-up appointment in: ONE YEAR  WITH PACEMAKER Lloyd Huger, MD  08/21/2015 11:25 PM    [facility] [address] [place], [place], [place]  [address] Phone: [phone]; Fax: [phone]

## 2015-08-20 NOTE — Patient Instructions (Addendum)
Your physician has recommended you make the following change in your medication:   STOP BRILINTA  START ASPIRIN 81 MG DAILY  INCREASE FUROSEMIDE TO 40 MG DAILY  START POTASSIUM 10 MEQ DAILY  Your physician recommends that you return for lab work in: ONE WEEK   Your physician recommends that you schedule a follow-up appointment in: 3 WEEKS WITH AN EXTENDER   KEEP LEGS AND RIGHT ARM ELEVATED AS MUCH AS POSSIBLE   Your physician recommends that you schedule a follow-up appointment in: 2 MONTHS WITH DR. HARDING  Remote monitoring is used to monitor your Pacemaker from home. This monitoring reduces the number of office visits required to check your device to one time per year. It allows [place] to monitor the functioning of your device to ensure it is working properly. You are scheduled for a device check from home on Nov 18, 2015. You may send your transmission at any time that day. If you have a wireless device, the transmission will be sent automatically. After your physician reviews your transmission, you will receive a postcard with your next transmission date.  Dr. Royann Shivers recommends that you schedule a follow-up appointment in: ONE YEAR  WITH PACEMAKER CHECK

## 2015-08-20 NOTE — Progress Notes (Signed)
Patient ID: [name], female   DOB: 21-Dec-1920, 82 y.o.   MRN: [id number]  Location: [facility]  Provider: Oneal Grout, MD   PCP: Michiel Sites, MD  Code Status: DNR  Goals of care:  Advanced Directive information Advanced Directives 08/07/2015  Does patient have an advance directive? Yes  Type of Advance Directive Out of facility DNR (pink MOST or yellow form)  Does patient want to make changes to advanced directive? -  Copy of advanced directive(s) in chart? Yes     Allergies  Allergen Reactions  . Statins Rash  . Tape Rash    Please use paper tape    Chief Complaint  Patient presents with  . Discharge Note    HPI:  82 y.o. female  Seen today at [facility] for discharge home. She is here for short term rehabilitation post hospital admission from 07/29/15-08/03/15 with altered mental status in setting of hypernatremia, progressive dementia and acute on chronic renal failure. Ct head showed no acute intracranial abnormality. She was treated with half NS then discharged to [facility]. She has PMH of CAD, CHF, hypothyroidism, pacemaker and dementia. She is seen in her room today. She denies any acute issues today. She has worked with PT/OT with much improvement and stable for discharge home. She will need to continue with Physical therapy for ROM, exercise, and muscle strengthening and  HH Aid to assist with ADL's at home. She will also need a standard wheelchair to allow her to maintain current level of independence.   Review of Systems  Unable to perform ROS: dementia    Past Medical History  Diagnosis Date  . ST elevation myocardial infarction (STEMI) of inferior wall (HCC) 05/2011; 03/10/2012    a) 11/'12: 100% RCA, dLM 80% -- POBA of RCA (for planned CABG, not done until re-admission with NSTEMI 1/'13);; b) 8/'13: 100% SVG-RCA (PCI & re-PCI), native RCA 100%; Patent LIMA-p-mLAD,  SVG-OM  . CAD S/P percutaneous coronary angioplasty 11/'12; 8/31 & 9/1/'13    a) MV: 100% RCA-POBA, dLM left main 80%; b) 1/'13: NSTEMI --> CABG; c) 2013: 8/31 - 100% RCA & acute SVG-RCA, 100% SVG-OM, 90% LM, 80% p&mLAD, ~70% RI, 50% Cx --> PCI-SVG-RCA: Promus Element DES x 3 (prox 2.5 mm x 38 mm & 2.5 mm x 16 mm, distal 2.5 mm x 16 mm); on 9/1 - accute in-stent Thrombosis - Aspiration thrombectomy & PTCA  . S/P CABG x 4 08/05/11    Dr. Tyrone Sage: LIMA-p-mLAD, SVG-RPDA, SVG-OM; known  100% SVG-OM, Extensive PTCA of SVG-RCA;;  Hospital course complicated by Afib & PNA; after d/c cellulitis of SVG harvest site due to edema  . Episodic atrial fibrillation (HCC) 07/2011    Post-Op CABG  . Ischemic cardiomyopathy 03/2012    Echo: EF 45-50% - Mild basal- mid inferolateral Hypokinesis: Gr 1 DD, mildly increased PAP.  Marland Kitchen Dyslipidemia, goal LDL below 70     statin intolerance (lipitor, crestor drug reaction); Welchol & Zetia  . Hypertension, essential   . DM (diabetes mellitus), type 2 with renal complications (HCC)   . Chronic kidney disease (CKD) stage G3b/A1, moderately decreased glomerular filtration rate (GFR) between 30-44 mL/min/1.73 square meter and albuminuria creatinine ratio less than 30 mg/g   . History of pneumonia     post-op from CABG  . Hypothyroidism     on synthroid  . Headache(784.0)   . Osteoarthritis   . Anxiety  PRN Xanax   . Dermatophytosis of nail   . Gout     on Allopurinol  . Lower extremity edema     chronic  . GERD (gastroesophageal reflux disease) 04/26/2015  . Acute CHF (congestive heart failure) (HCC) 06/24/2015  . Delirium 07/30/2015  . Presence of permanent cardiac pacemaker   . Depressed 08/03/2015    Past Surgical History  Procedure Laterality Date  . Cardiac catheterization  November 2012; August and September 2013    Patent LIMA-LAD, patent she had an OM, patent stented SVG-RCA; occluded native RCA, 90% ostial left main, tandem 80% proximal and mid LAD,  70% mid Circumflex  . Coronary angioplasty  November 2012    PTCA only of RCA and setting of inferior STEMI  . Coronary artery bypass graft  08/05/2011    Procedure: CORONARY ARTERY BYPASS GRAFTING (CABG);  Surgeon: [name], MD;  Location: [facility] OR;  Service: Open Heart Surgery;  Laterality: N/A;  coronary artery bypass graft times 4 using left internal mammary artery and right leg saphenous vein harvested endoscopically  . Joint replacement      both hips replaced  . I&d extremity  09/21/2011    Procedure: IRRIGATION AND DEBRIDEMENT EXTREMITY;  Surgeon: [name], MD;  Location: [facility] OR;  Service: Vascular;  Laterality: Right;  with wound vac placement  . Coronary angioplasty with stent placement  03/10/2012     infferior STEMI: Occluded native RCA and SVG-RCA --> thrombectomy and 3 stent placement to the SVG-RCA (2.5 mm right 38 mm and 2 proximal distal overlapping 2.5 mm x 16 mm Promus DES stents:  . Coronary angioplasty  03/11/2012    Inferior STEMI #3: Reoccluded SVG-RCA; extensive thrombectomy and post dilation PTCA  . Transthoracic echocardiogram  September 2013    EF 45-50%, mild hypokinesis of the basal and mid inferolateral wall, grade 1 diastolic dysfunction, mildly increased artery pressures.  . Left heart catheterization with coronary angiogram N/A 05/26/2011    Procedure: LEFT HEART CATHETERIZATION WITH CORONARY ANGIOGRAM;  Surgeon: Marykay Lex, MD;  Location: [facility] CATH LAB;  Service: Cardiovascular;  Laterality: N/A;  . Percutaneous coronary intervention-balloon only N/A 05/26/2011    Procedure: PERCUTANEOUS CORONARY INTERVENTION-BALLOON ONLY;  Surgeon: Marykay Lex, MD;  Location: [facility] CATH LAB;  Service: Cardiovascular;  Laterality: N/A;  . Left heart cath N/A 03/10/2012    Procedure: LEFT HEART CATH;  Surgeon: Herby Abraham, MD;  Location: [facility] CATH LAB;  Service: Cardiovascular;  Laterality: N/A;  . Percutaneous coronary stent intervention (pci-s)  03/10/2012     Procedure: PERCUTANEOUS CORONARY STENT INTERVENTION (PCI-S);  Surgeon: Herby Abraham, MD;  Location: [facility] CATH LAB;  Service: Cardiovascular;;  . Left heart catheterization with coronary angiogram Bilateral 03/11/2012    Procedure: LEFT HEART CATHETERIZATION WITH CORONARY ANGIOGRAM;  Surgeon: Thurmon Fair, MD;  Location: [facility] CATH LAB;  Service: Cardiovascular;  Laterality: Bilateral;  . Ep implantable device N/A 02/24/2015    Procedure: Pacemaker Implant;  Surgeon: Thurmon Fair, MD;  Location: [facility] INVASIVE CV LAB;  Service: Cardiovascular;  Laterality: N/A;  . Ep implantable device N/A 04/17/2015    Procedure: Lead Extraction, Can Extraction;  Surgeon: Marinus Maw, MD;  Location: [facility] INVASIVE CV LAB;  Service: Cardiovascular;  Laterality: N/A;  . Cardiac catheterization N/A 04/17/2015    Procedure: Temporary Pacemaker;  Surgeon: Marinus Maw, MD;  Location: [facility] INVASIVE CV LAB;  Service: Cardiovascular;  Laterality: N/A;  . Ep implantable device N/A 04/21/2015  Procedure: Pacemaker Implant;  Surgeon: Marinus Maw, MD;  Location: [facility] INVASIVE CV LAB;  Service: Cardiovascular;  Laterality: N/A;  . Pacemaker lead removal  04/21/2015    Procedure: Pacemaker Lead Removal;  Surgeon: Marinus Maw, MD;  Location: [facility] INVASIVE CV LAB;  Service: Cardiovascular;;  LV       reports that she has never smoked. She has never used smokeless tobacco. She reports that she does not drink alcohol or use illicit drugs.  Allergies  Allergen Reactions  . Statins Rash  . Tape Rash    Please use paper tape    Pertinent  Health Maintenance Due  Topic Date Due  . FOOT EXAM  06/20/1931  . OPHTHALMOLOGY EXAM  06/20/1931  . URINE MICROALBUMIN  [birth year]20/1931  . DEXA SCAN  06/20/1988  . INFLUENZA VACCINE  07/11/2016 (Originally 02/09/2015)  . PNA vac Low Risk Adult (2 of 2 - PCV13) 07/11/2016 (Originally 05/27/2012)  . HEMOGLOBIN A1C  08/26/2015    Medications:   Medication List       This list is accurate  as of: 08/20/15  1:15 PM.  Always use your most recent med list.               acetaminophen 325 MG tablet  Commonly known as:  TYLENOL  Take 650 mg by mouth 2 (two) times daily.     allopurinol 100 MG tablet  Commonly known as:  ZYLOPRIM  Take 100 mg by mouth daily.     carvedilol 6.25 MG tablet  Commonly known as:  COREG  Take 1 tablet (6.25 mg total) by mouth 2 (two) times daily with a meal.     colesevelam 625 MG tablet  Commonly known as:  WELCHOL  Take 1,875 mg by mouth See admin instructions. Take 3 tablets (1875 mg) with breakfast and with lunch     ferrous sulfate 325 (65 FE) MG tablet  Take 1 tablet (325 mg total) by mouth daily with breakfast.     FLUoxetine 10 MG capsule  Commonly known as:  PROZAC  Take 1 capsule (10 mg total) by mouth daily.     folic acid 1 MG tablet  Commonly known as:  FOLVITE  Take 1 tablet (1 mg total) by mouth daily.     furosemide 20 MG tablet  Commonly known as:  LASIX  Take 1 tablet (20 mg total) by mouth daily as needed (for leg swelling or weight gain of 2 Lbs in 1 day).     levothyroxine 100 MCG tablet  Commonly known as:  SYNTHROID, LEVOTHROID  Take 100 mcg by mouth daily.     megestrol 400 MG/10ML suspension  Commonly known as:  MEGACE  Take 10 mLs (400 mg total) by mouth daily.     mirtazapine 15 MG tablet  Commonly known as:  REMERON  Take 1 tablet (15 mg total) by mouth at bedtime.     pantoprazole 40 MG tablet  Commonly known as:  PROTONIX  Take 40 mg by mouth daily.     polyethylene glycol packet  Commonly known as:  MIRALAX / GLYCOLAX  Take 17 g by mouth daily as needed for moderate constipation.     polyvinyl alcohol 1.4 % ophthalmic solution  Commonly known as:  LIQUIFILM TEARS  Place 1 drop into both eyes 3 (three) times daily.     ticagrelor 90 MG Tabs tablet  Commonly known as:  BRILINTA  TAKE 1 TABLET (90 MG TOTAL) BY MOUTH 2 (  TWO) TIMES DAILY. Resume on 03/02/15         Filed Vitals:    08/20/15 1304  BP: 125/75  Pulse: 78  Temp: 97.5 F (36.4 C)  Resp: 18  Weight: 144 lb 8 oz (65.545 kg)  SpO2: 94%   Body mass index is 27.32 kg/(m^2). Physical Exam  Constitutional: She appears well-developed and well-nourished.  Elderly in no acute distress  HENT:  Head: Normocephalic.  Mouth/Throat: Oropharynx is clear and moist.  Eyes: EOM are normal. Pupils are equal, round, and reactive to light. Right eye exhibits no discharge. Left eye exhibits no discharge. No scleral icterus.  Neck: Normal range of motion. No JVD present. No tracheal deviation present. No thyromegaly present.  Cardiovascular: Normal rate, regular rhythm, normal heart sounds and intact distal pulses.  Exam reveals no gallop and no friction rub.   No murmur heard. Pulmonary/Chest: Effort normal and breath sounds normal. No respiratory distress. She has no wheezes. She has no rales. She exhibits no tenderness.  Abdominal: Soft. Bowel sounds are normal. She exhibits no distension and no mass. There is no tenderness. There is no rebound and no guarding.  Musculoskeletal: Normal range of motion. She exhibits no tenderness.  + 1 Bilateral Lower extremities.   Lymphadenopathy:    She has no cervical adenopathy.  Neurological: She is alert.  Skin: Skin is warm and dry. No rash noted. No erythema. No pallor.  Psychiatric: She has a normal mood and affect.    Labs reviewed: Basic Metabolic Panel:  Recent Labs  41/58/30 1718  02/23/15 2213  06/25/15 0041  08/01/15 1325  08/02/15 1130  08/03/15 0330 08/03/15 0658 08/03/15 1220  NA 144  < > 145  < >  --   < > 146*  < > 144  < > 144 143 143  K 4.1  < > 3.9  < >  --   < > 4.2  --  4.2  --  4.2  --   --   CL 114*  < > 112*  < >  --   < > 114*  --  115*  --  117*  --   --   CO2 22  --  24  < >  --   < > 21*  --  21*  --  24  --   --   GLUCOSE 168*  < > 124*  < >  --   < > 103*  --  57*  --  94  --   --   BUN 40*  < > 39*  < >  --   < > 27*  --  23*  --  19   --   --   CREATININE 2.63*  < > 2.53*  < >  --   < > 1.68*  --  1.52*  --  1.36*  --   --   CALCIUM 8.8*  --  9.2  < >  --   < > 9.1  --  9.0  --  9.0  --   --   MG 2.0  --  2.1  --  1.9  --   --   --   --   --   --   --   --   PHOS  --   --   --   --  2.8  --   --   --   --   --   --   --   --   < > =  values in this interval not displayed. Liver Function Tests:  Recent Labs  07/29/15 2235 07/30/15 0354 08/02/15 1130  AST 18 13* 15  ALT 10* 8* 9*  ALKPHOS 151* 122 98  BILITOT 0.8 0.7 0.3  PROT 7.0 6.0* 4.7*  ALBUMIN 3.4* 2.9* 2.3*   No results for input(s): LIPASE, AMYLASE in the last 8760 hours. No results for input(s): AMMONIA in the last 8760 hours. CBC:  Recent Labs  02/23/15 1718  07/29/15 2235  08/01/15 0602 08/02/15 1130 08/03/15 0330  WBC 4.5  < > 8.2  < > 4.6 3.5* 3.8*  NEUTROABS 3.0  --  6.8  --   --  1.6*  --   HGB 9.0*  < > 11.4*  < > 8.5* 8.0* 8.8*  HCT 28.8*  < > 36.7  < > 26.5* 25.4* 28.5*  MCV 82.3  < > 81.9  < > 80.8 82.7 83.6  PLT 112*  < > 155  < > 142* 130* 136*  < > = values in this interval not displayed. Cardiac Enzymes:  Recent Labs  02/24/15 0305 02/24/15 0959 06/25/15 0710  TROPONINI <0.03 <0.03 <0.03   BNP: Invalid input(s): POCBNP CBG:  Recent Labs  06/26/15 1139 07/29/15 2224 07/30/15 0047  GLUCAP 117* 116* 117*    Procedures and Imaging Studies During Stay: Ct Head Wo Contrast  07/30/2015  CLINICAL DATA:  Altered mental status, lower abdominal pain, with anorexia. Head trauma. History of hypertension, hyperlipidemia, diabetes, chronic kidney disease. EXAM: CT HEAD WITHOUT CONTRAST TECHNIQUE: Contiguous axial images were obtained from the base of the skull through the vertex without intravenous contrast. COMPARISON:  CT head March 01, 2015 FINDINGS: Mildly motion degraded examination. The ventricles and sulci are normal for age. No intraparenchymal hemorrhage, mass effect nor midline shift. Patchy supratentorial white matter  hypodensities are less than expected for patient's age and though non-specific suggest sequelae of chronic small vessel ischemic disease. No acute large vascular territory infarcts. No abnormal extra-axial fluid collections. Basal cisterns are patent. Moderate calcific atherosclerosis of the carotid siphons. No skull fracture. The included ocular globes and orbital contents are non-suspicious. Soft tissue partially opacifies the included RIGHT maxillary sinus. The mastoid air cells are well aerated. Severe LEFT temporomandibular osteoarthrosis. IMPRESSION: Negative CT head for age. Electronically Signed   By: Awilda Metro M.D.   On: 07/30/2015 03:04   Dg Chest Port 1 View  08/02/2015  CLINICAL DATA:  Worsening dementia and delirium. EXAM: PORTABLE CHEST 1 VIEW COMPARISON:  07/29/2015 FINDINGS: Dual lead cardiac pacemaker is unchanged. The cardiac silhouette is stably enlarged. Mediastinal contours appear intact. The aorta is torturous and contains atherosclerotic calcifications. There is no evidence of focal airspace consolidation, pleural effusion or pneumothorax. Osseous structures are without acute abnormality. Soft tissues are grossly normal. IMPRESSION: Stably enlarged cardiac silhouette. No evidence of focal airspace consolidation. Electronically Signed   By: Ted Mcalpine M.D.   On: 08/02/2015 15:52   Dg Chest Port 1 View  07/30/2015  CLINICAL DATA:  82 year old female with altered mental status EXAM: PORTABLE CHEST 1 VIEW COMPARISON:  Radiograph dated 06/24/2015 FINDINGS: Single-view of the chest demonstrates mild central vascular prominence likely a degree of congestive changes. No focal consolidation, pleural effusion, or pneumothorax. Stable cardiomegaly. Right pectoral pacemaker device. No acute osseous pathology. IMPRESSION: Stable cardiomegaly with mild congestive changes. No focal consolidation. Electronically Signed   By: Elgie Collard M.D.   On: 07/30/2015 00:24     Assessment/Plan:   1. Gastroesophageal reflux  disease without esophagitis Asymptomatic. Continue on Protonix 40 mg Tablet daily.   2. Hypertensive heart disease with CHF (congestive heart failure) (HCC) B/p within target goal range. Continue on Coreg twice daily. Follow up with PCP to monitor B/p.   3. Atherosclerosis of coronary artery bypass graft of native heart without angina pectoris Remains chest pain free.S/p PCI x  3  Continue coreg,Welchol and Brilinta and Monitor.  4. Hypothyroidism, unspecified hypothyroidism type Continue on Levothyroxine 100 mcg Tablet. Follow up with PCP to Monitor TSH level.   5. Depressed No mood changes. Continue on Fluoxetine 10 mg Capsule daily.   6. Type 2 diabetes mellitus with stage 2 chronic kidney disease, without long-term current use of insulin (HCC) Stable. Continue on Welchol. Monitor CBG's. Follow up with PCP to monitor Hgb A1C.  7. Anemia  Recent Hgb 9.2 Continue with Ferrous sulafate twice daily. Follow up with PCP to monitor CBC 8. Gait abnormality  Will discharge home with Physical therapy for ROM, exercise, and muscle strengthening and  HH Aid to assist with ADL's at home. She will also need a standard wheelchair to allow her to maintain current level of independence.        Patient is being discharged with the following home health services:  Physical therapy for ROM, exercise, and muscle strengthening   HH Aid to assist with ADL's at home     Patient is being discharged with the following durable medical equipment:   Standard wheelchair to allow her to maintain current level of independence.  Patient has been advised to f/u with their PCP in 1-2 weeks to bring them up to date on their rehab stay.  Social services at facility was responsible for arranging this appointment.  Pt was provided with a 30 day supply of prescriptions for medications and refills must be obtained from their PCP.  For controlled substances, a more  limited supply may be provided adequate until PCP appointment only.  Future labs/tests needed:  CBC, CMP, TSH and Hgb A1C  with PCP

## 2015-08-21 ENCOUNTER — Encounter: Payer: Self-pay | Admitting: Cardiovascular Disease

## 2015-08-21 DIAGNOSIS — I82B11 Acute embolism and thrombosis of right subclavian vein: Secondary | ICD-10-CM | POA: Insufficient documentation

## 2015-08-24 ENCOUNTER — Inpatient Hospital Stay (HOSPITAL_COMMUNITY)
Admission: EM | Admit: 2015-08-24 | Discharge: 2015-08-30 | DRG: 871 | Disposition: A | Discharge status: Hospice | Payer: Medicare Other | Attending: Internal Medicine | Admitting: Internal Medicine

## 2015-08-24 ENCOUNTER — Emergency Department (HOSPITAL_COMMUNITY): Payer: Medicare Other

## 2015-08-24 ENCOUNTER — Telehealth: Payer: Self-pay | Admitting: Cardiovascular Disease

## 2015-08-24 ENCOUNTER — Observation Stay (HOSPITAL_COMMUNITY): Payer: Medicare Other

## 2015-08-24 ENCOUNTER — Encounter (HOSPITAL_COMMUNITY): Payer: Self-pay | Admitting: Emergency Medicine

## 2015-08-24 DIAGNOSIS — T68XXXA Hypothermia, initial encounter: Secondary | ICD-10-CM | POA: Insufficient documentation

## 2015-08-24 DIAGNOSIS — R609 Edema, unspecified: Secondary | ICD-10-CM | POA: Diagnosis present

## 2015-08-24 DIAGNOSIS — E1121 Type 2 diabetes mellitus with diabetic nephropathy: Secondary | ICD-10-CM | POA: Diagnosis not present

## 2015-08-24 DIAGNOSIS — F419 Anxiety disorder, unspecified: Secondary | ICD-10-CM | POA: Diagnosis present

## 2015-08-24 DIAGNOSIS — I25709 Atherosclerosis of coronary artery bypass graft(s), unspecified, with unspecified angina pectoris: Secondary | ICD-10-CM | POA: Diagnosis present

## 2015-08-24 DIAGNOSIS — K219 Gastro-esophageal reflux disease without esophagitis: Secondary | ICD-10-CM | POA: Diagnosis present

## 2015-08-24 DIAGNOSIS — J690 Pneumonitis due to inhalation of food and vomit: Secondary | ICD-10-CM | POA: Diagnosis not present

## 2015-08-24 DIAGNOSIS — I5042 Chronic combined systolic (congestive) and diastolic (congestive) heart failure: Secondary | ICD-10-CM

## 2015-08-24 DIAGNOSIS — I255 Ischemic cardiomyopathy: Secondary | ICD-10-CM | POA: Diagnosis not present

## 2015-08-24 DIAGNOSIS — N183 Chronic kidney disease, stage 3 unspecified: Secondary | ICD-10-CM | POA: Diagnosis present

## 2015-08-24 DIAGNOSIS — R339 Retention of urine, unspecified: Secondary | ICD-10-CM | POA: Diagnosis not present

## 2015-08-24 DIAGNOSIS — G9340 Encephalopathy, unspecified: Secondary | ICD-10-CM | POA: Insufficient documentation

## 2015-08-24 DIAGNOSIS — M7989 Other specified soft tissue disorders: Secondary | ICD-10-CM | POA: Diagnosis not present

## 2015-08-24 DIAGNOSIS — I252 Old myocardial infarction: Secondary | ICD-10-CM

## 2015-08-24 DIAGNOSIS — I25810 Atherosclerosis of coronary artery bypass graft(s) without angina pectoris: Secondary | ICD-10-CM | POA: Diagnosis not present

## 2015-08-24 DIAGNOSIS — R1310 Dysphagia, unspecified: Secondary | ICD-10-CM | POA: Diagnosis not present

## 2015-08-24 DIAGNOSIS — E039 Hypothyroidism, unspecified: Secondary | ICD-10-CM | POA: Diagnosis present

## 2015-08-24 DIAGNOSIS — F0390 Unspecified dementia without behavioral disturbance: Secondary | ICD-10-CM | POA: Diagnosis not present

## 2015-08-24 DIAGNOSIS — E870 Hyperosmolality and hypernatremia: Secondary | ICD-10-CM | POA: Diagnosis not present

## 2015-08-24 DIAGNOSIS — E860 Dehydration: Secondary | ICD-10-CM | POA: Diagnosis not present

## 2015-08-24 DIAGNOSIS — A419 Sepsis, unspecified organism: Secondary | ICD-10-CM | POA: Diagnosis not present

## 2015-08-24 DIAGNOSIS — Z79818 Long term (current) use of other agents affecting estrogen receptors and estrogen levels: Secondary | ICD-10-CM

## 2015-08-24 DIAGNOSIS — D539 Nutritional anemia, unspecified: Secondary | ICD-10-CM | POA: Diagnosis present

## 2015-08-24 DIAGNOSIS — Z515 Encounter for palliative care: Secondary | ICD-10-CM | POA: Diagnosis not present

## 2015-08-24 DIAGNOSIS — T502X5A Adverse effect of carbonic-anhydrase inhibitors, benzothiadiazides and other diuretics, initial encounter: Secondary | ICD-10-CM | POA: Diagnosis not present

## 2015-08-24 DIAGNOSIS — T68XXXD Hypothermia, subsequent encounter: Secondary | ICD-10-CM | POA: Diagnosis not present

## 2015-08-24 DIAGNOSIS — I5043 Acute on chronic combined systolic (congestive) and diastolic (congestive) heart failure: Secondary | ICD-10-CM | POA: Diagnosis present

## 2015-08-24 DIAGNOSIS — E11649 Type 2 diabetes mellitus with hypoglycemia without coma: Secondary | ICD-10-CM | POA: Diagnosis present

## 2015-08-24 DIAGNOSIS — M109 Gout, unspecified: Secondary | ICD-10-CM | POA: Diagnosis present

## 2015-08-24 DIAGNOSIS — I2510 Atherosclerotic heart disease of native coronary artery without angina pectoris: Secondary | ICD-10-CM | POA: Diagnosis not present

## 2015-08-24 DIAGNOSIS — R6510 Systemic inflammatory response syndrome (SIRS) of non-infectious origin without acute organ dysfunction: Secondary | ICD-10-CM | POA: Diagnosis present

## 2015-08-24 DIAGNOSIS — N182 Chronic kidney disease, stage 2 (mild): Secondary | ICD-10-CM

## 2015-08-24 DIAGNOSIS — R4182 Altered mental status, unspecified: Secondary | ICD-10-CM | POA: Diagnosis not present

## 2015-08-24 DIAGNOSIS — M1990 Unspecified osteoarthritis, unspecified site: Secondary | ICD-10-CM | POA: Diagnosis not present

## 2015-08-24 DIAGNOSIS — Z7982 Long term (current) use of aspirin: Secondary | ICD-10-CM

## 2015-08-24 DIAGNOSIS — R404 Transient alteration of awareness: Secondary | ICD-10-CM | POA: Diagnosis not present

## 2015-08-24 DIAGNOSIS — N179 Acute kidney failure, unspecified: Secondary | ICD-10-CM | POA: Diagnosis present

## 2015-08-24 DIAGNOSIS — Z955 Presence of coronary angioplasty implant and graft: Secondary | ICD-10-CM

## 2015-08-24 DIAGNOSIS — E1122 Type 2 diabetes mellitus with diabetic chronic kidney disease: Secondary | ICD-10-CM | POA: Diagnosis present

## 2015-08-24 DIAGNOSIS — R680 Hypothermia, not associated with low environmental temperature: Secondary | ICD-10-CM | POA: Diagnosis present

## 2015-08-24 DIAGNOSIS — Z66 Do not resuscitate: Secondary | ICD-10-CM | POA: Diagnosis present

## 2015-08-24 DIAGNOSIS — I130 Hypertensive heart and chronic kidney disease with heart failure and stage 1 through stage 4 chronic kidney disease, or unspecified chronic kidney disease: Secondary | ICD-10-CM | POA: Diagnosis present

## 2015-08-24 DIAGNOSIS — N184 Chronic kidney disease, stage 4 (severe): Secondary | ICD-10-CM | POA: Diagnosis present

## 2015-08-24 DIAGNOSIS — R531 Weakness: Secondary | ICD-10-CM | POA: Diagnosis not present

## 2015-08-24 DIAGNOSIS — E1129 Type 2 diabetes mellitus with other diabetic kidney complication: Secondary | ICD-10-CM | POA: Diagnosis present

## 2015-08-24 DIAGNOSIS — Z950 Presence of cardiac pacemaker: Secondary | ICD-10-CM

## 2015-08-24 DIAGNOSIS — Z7189 Other specified counseling: Secondary | ICD-10-CM | POA: Insufficient documentation

## 2015-08-24 DIAGNOSIS — Z9189 Other specified personal risk factors, not elsewhere classified: Secondary | ICD-10-CM

## 2015-08-24 DIAGNOSIS — J9811 Atelectasis: Secondary | ICD-10-CM | POA: Diagnosis not present

## 2015-08-24 LAB — CBC WITH DIFFERENTIAL/PLATELET
BASOS PCT: 0 %
Basophils Absolute: 0 10*3/uL (ref 0.0–0.1)
EOS ABS: 0.1 10*3/uL (ref 0.0–0.7)
EOS PCT: 1 %
HCT: 31.6 % — ABNORMAL LOW (ref 36.0–46.0)
HEMOGLOBIN: 9.9 g/dL — AB (ref 12.0–15.0)
LYMPHS PCT: 15 %
Lymphs Abs: 0.9 10*3/uL (ref 0.7–4.0)
MCH: 25.6 pg — AB (ref 26.0–34.0)
MCHC: 31.3 g/dL (ref 30.0–36.0)
MCV: 81.9 fL (ref 78.0–100.0)
Monocytes Absolute: 0.6 10*3/uL (ref 0.1–1.0)
Monocytes Relative: 10 %
NEUTROS PCT: 74 %
Neutro Abs: 4.5 10*3/uL (ref 1.7–7.7)
Platelets: 108 10*3/uL — ABNORMAL LOW (ref 150–400)
RBC: 3.86 MIL/uL — AB (ref 3.87–5.11)
RDW: 24.2 % — ABNORMAL HIGH (ref 11.5–15.5)
WBC: 6.1 10*3/uL (ref 4.0–10.5)

## 2015-08-24 LAB — URINALYSIS, ROUTINE W REFLEX MICROSCOPIC
BILIRUBIN URINE: NEGATIVE
Glucose, UA: NEGATIVE mg/dL
Hgb urine dipstick: NEGATIVE
KETONES UR: NEGATIVE mg/dL
Leukocytes, UA: NEGATIVE
NITRITE: NEGATIVE
Protein, ur: NEGATIVE mg/dL
Specific Gravity, Urine: 1.019 (ref 1.005–1.030)
pH: 5 (ref 5.0–8.0)

## 2015-08-24 LAB — COMPREHENSIVE METABOLIC PANEL
ALT: 10 U/L — ABNORMAL LOW (ref 14–54)
ANION GAP: 7 (ref 5–15)
AST: 14 U/L — ABNORMAL LOW (ref 15–41)
Albumin: 2.4 g/dL — ABNORMAL LOW (ref 3.5–5.0)
Alkaline Phosphatase: 108 U/L (ref 38–126)
BUN: 24 mg/dL — ABNORMAL HIGH (ref 6–20)
CALCIUM: 9.2 mg/dL (ref 8.9–10.3)
CHLORIDE: 122 mmol/L — AB (ref 101–111)
CO2: 16 mmol/L — AB (ref 22–32)
Creatinine, Ser: 1.69 mg/dL — ABNORMAL HIGH (ref 0.44–1.00)
GFR calc non Af Amer: 25 mL/min — ABNORMAL LOW (ref >60)
GFR, EST AFRICAN AMERICAN: 29 mL/min — AB (ref >60)
Glucose, Bld: 74 mg/dL (ref 65–99)
Potassium: 5 mmol/L (ref 3.5–5.1)
SODIUM: 145 mmol/L (ref 135–145)
Total Bilirubin: 0.4 mg/dL (ref 0.3–1.2)
Total Protein: 5.1 g/dL — ABNORMAL LOW (ref 6.5–8.1)

## 2015-08-24 LAB — GLUCOSE, CAPILLARY: GLUCOSE-CAPILLARY: 60 mg/dL — AB (ref 65–99)

## 2015-08-24 LAB — I-STAT CG4 LACTIC ACID, ED
LACTIC ACID, VENOUS: 0.58 mmol/L (ref 0.5–2.0)
Lactic Acid, Venous: 0.51 mmol/L (ref 0.5–2.0)

## 2015-08-24 LAB — CBG MONITORING, ED: GLUCOSE-CAPILLARY: 68 mg/dL (ref 65–99)

## 2015-08-24 LAB — I-STAT TROPONIN, ED: TROPONIN I, POC: 0.02 ng/mL (ref 0.00–0.08)

## 2015-08-24 LAB — BRAIN NATRIURETIC PEPTIDE: B NATRIURETIC PEPTIDE 5: 869.5 pg/mL — AB (ref 0.0–100.0)

## 2015-08-24 MED ORDER — ENOXAPARIN SODIUM 30 MG/0.3ML ~~LOC~~ SOLN
30.0000 mg | SUBCUTANEOUS | Status: DC
  Administered 2015-08-25 – 2015-08-30 (×6): 30 mg via SUBCUTANEOUS
  Filled 2015-08-24 (×6): qty 0.3

## 2015-08-24 MED ORDER — ACETAMINOPHEN 325 MG PO TABS: 650.0000 mg | ORAL_TABLET | Freq: Four times a day (QID) | ORAL | Status: DC | PRN

## 2015-08-24 MED ORDER — PANTOPRAZOLE SODIUM 40 MG PO TBEC
40.0000 mg | DELAYED_RELEASE_TABLET | Freq: Every day | ORAL | Status: DC
  Administered 2015-08-26 – 2015-08-29 (×4): 40 mg via ORAL
  Filled 2015-08-24 (×5): qty 1

## 2015-08-24 MED ORDER — PIPERACILLIN-TAZOBACTAM 3.375 G IVPB 30 MIN
3.3750 g | Freq: Once | INTRAVENOUS | Status: AC
  Administered 2015-08-24: 3.375 g via INTRAVENOUS
  Filled 2015-08-24: qty 50

## 2015-08-24 MED ORDER — ALLOPURINOL 100 MG PO TABS
100.0000 mg | ORAL_TABLET | Freq: Every day | ORAL | Status: DC
  Administered 2015-08-26 – 2015-08-29 (×4): 100 mg via ORAL
  Filled 2015-08-24 (×5): qty 1

## 2015-08-24 MED ORDER — LEVOTHYROXINE SODIUM 100 MCG PO TABS
100.0000 ug | ORAL_TABLET | Freq: Every day | ORAL | Status: DC
  Administered 2015-08-26 – 2015-08-29 (×4): 100 ug via ORAL
  Filled 2015-08-24 (×5): qty 1

## 2015-08-24 MED ORDER — POLYETHYLENE GLYCOL 3350 17 G PO PACK: 17.0000 g | PACK | Freq: Every day | ORAL | Status: DC | PRN

## 2015-08-24 MED ORDER — PIPERACILLIN-TAZOBACTAM IN DEX 2-0.25 GM/50ML IV SOLN
2.2500 g | Freq: Three times a day (TID) | INTRAVENOUS | Status: DC
  Administered 2015-08-25 – 2015-08-30 (×15): 2.25 g via INTRAVENOUS
  Filled 2015-08-24 (×22): qty 50

## 2015-08-24 MED ORDER — POLYVINYL ALCOHOL 1.4 % OP SOLN
1.0000 [drp] | Freq: Three times a day (TID) | OPHTHALMIC | Status: DC
  Administered 2015-08-25 – 2015-08-29 (×13): 1 [drp] via OPHTHALMIC
  Filled 2015-08-24: qty 15

## 2015-08-24 MED ORDER — CARVEDILOL 6.25 MG PO TABS
6.2500 mg | ORAL_TABLET | Freq: Two times a day (BID) | ORAL | Status: DC
  Administered 2015-08-26 – 2015-08-29 (×7): 6.25 mg via ORAL
  Filled 2015-08-24 (×9): qty 1

## 2015-08-24 MED ORDER — VANCOMYCIN HCL IN DEXTROSE 1-5 GM/200ML-% IV SOLN
1000.0000 mg | INTRAVENOUS | Status: DC
  Filled 2015-08-24: qty 200

## 2015-08-24 MED ORDER — MEGESTROL ACETATE 400 MG/10ML PO SUSP
400.0000 mg | Freq: Every day | ORAL | Status: DC
Start: 2015-08-25 — End: 2015-08-29
  Administered 2015-08-26 – 2015-08-28 (×3): 400 mg via ORAL
  Filled 2015-08-24 (×5): qty 10

## 2015-08-24 MED ORDER — SODIUM CHLORIDE 0.9 % IV SOLN
INTRAVENOUS | Status: DC
  Administered 2015-08-25: 03:00:00 via INTRAVENOUS

## 2015-08-24 MED ORDER — ONDANSETRON HCL 4 MG PO TABS: 4.0000 mg | ORAL_TABLET | Freq: Four times a day (QID) | ORAL | Status: DC | PRN

## 2015-08-24 MED ORDER — SODIUM CHLORIDE 0.9 % IV BOLUS (SEPSIS)
1000.0000 mL | Freq: Once | INTRAVENOUS | Status: AC
  Administered 2015-08-24: 1000 mL via INTRAVENOUS

## 2015-08-24 MED ORDER — INSULIN ASPART 100 UNIT/ML ~~LOC~~ SOLN: 0.0000 [IU] | SUBCUTANEOUS | Status: DC

## 2015-08-24 MED ORDER — ACETAMINOPHEN 650 MG RE SUPP: 650.0000 mg | Freq: Four times a day (QID) | RECTAL | Status: DC | PRN

## 2015-08-24 MED ORDER — ONDANSETRON HCL 4 MG/2ML IJ SOLN: 4.0000 mg | Freq: Four times a day (QID) | INTRAMUSCULAR | Status: DC | PRN

## 2015-08-24 MED ORDER — ASPIRIN 81 MG PO CHEW
81.0000 mg | CHEWABLE_TABLET | Freq: Every day | ORAL | Status: DC
  Administered 2015-08-26 – 2015-08-29 (×4): 81 mg via ORAL
  Filled 2015-08-24 (×5): qty 1

## 2015-08-24 MED ORDER — HYDROCODONE-ACETAMINOPHEN 5-325 MG PO TABS
1.0000 | ORAL_TABLET | ORAL | Status: DC | PRN
  Administered 2015-08-29: 2 via ORAL
  Filled 2015-08-24: qty 2

## 2015-08-24 MED ORDER — SODIUM CHLORIDE 0.9 % IV SOLN
1500.0000 mg | Freq: Once | INTRAVENOUS | Status: AC
  Administered 2015-08-24: 1500 mg via INTRAVENOUS
  Filled 2015-08-24: qty 1500

## 2015-08-24 NOTE — ED Notes (Signed)
Xray at the bedside.

## 2015-08-24 NOTE — ED Provider Notes (Signed)
Level V caveat dementia. History is obtained from patient's family members who state that she has not been eating well for approximately the past 2 days. Patient complains of generalized weakness. She is currently in a skilled nursing facility your patient denies pain anywhere. Noted to be hypothermic. Lungs clear to auscultation heart regular rate and rhythm abdomen nondistended nontender extremities without edema. In light of hypothermia, worsening renal poor by mouth intake function suspected infection with dehydration plan gentle intravenous hydration, intravenous antibiotics chest xrayviewed by me  Doug Sou, MD 08/24/15 Paulo Fruit

## 2015-08-24 NOTE — ED Notes (Signed)
MD Jacubowitz at the bedside   

## 2015-08-24 NOTE — ED Notes (Signed)
Both sets of blood cultures have been drawn. One by Edwina Barth and the 2nd set by phlebotomy.

## 2015-08-24 NOTE — Consult Note (Addendum)
Pharmacy Antibiotic Note  [name] is a 82 y.o. female admitted on 08/24/2015 with sepsis.  Pharmacy has been consulted for vanc/zosyn dosing.  Pt presents from [facility] w/ weakness, hypothermia, and worsening renal function.  Plan: Vanc 1500 mg IV x1 in the ED, then 1000 mg IV q48h Zosyn 3.375 g IV x1 in ED, then 2.25 g IV q8h F/u renal function, LOT, cultures, VT prn, clinical progresison    Temp (24hrs), Avg:93.3 F (34.1 C), Min:93.3 F (34.1 C), Max:93.3 F (34.1 C)   Recent Labs Lab 08/24/15 1715 08/24/15 1728  WBC 6.1  --   CREATININE 1.69*  --   LATICACIDVEN  --  0.58    Estimated Creatinine Clearance: 18.7 mL/min (by C-G formula based on Cr of 1.69).    Antimicrobials this admission: Vanc 2/13 >>  Zosyn 2/13 >>   Dose adjustments this admission: n/a  Microbiology results: 2/13 BCx:  2/13 UCx:    Thank you for allowing pharmacy to be a part of this patient's care.  Greggory Stallion, PharmD Clinical Pharmacy Resident Pager # [phone] 08/24/2015 7:02 PM

## 2015-08-24 NOTE — H&P (Signed)
PCP:  Michiel Sites, MD  Cardiology [facility]  Referring provider [facility]   Chief Complaint: Not feeling well  HPI: [name] is a 82 y.o. female   has a past medical history of ST elevation myocardial infarction (STEMI) of inferior wall (HCC) (05/2011; 03/10/2012); CAD S/P percutaneous coronary angioplasty (11/'12; 8/31 & 9/1/'13); S/P CABG x 4 (08/05/11); Episodic atrial fibrillation (HCC) (07/2011); Ischemic cardiomyopathy (03/2012); Dyslipidemia, goal LDL below 70; Hypertension, essential; DM (diabetes mellitus), type 2 with renal complications (HCC); Chronic kidney disease (CKD) stage G3b/A1, moderately decreased glomerular filtration rate (GFR) between 30-44 mL/min/1.73 square meter and albuminuria creatinine ratio less than 30 mg/g; History of pneumonia; Hypothyroidism; Headache(784.0); Osteoarthritis; Anxiety; Dermatophytosis of nail; Gout; Lower extremity edema; GERD (gastroesophageal reflux disease) (04/26/2015); Acute CHF (congestive heart failure) (HCC) (06/24/2015); Delirium (07/30/2015); Presence of permanent cardiac pacemaker; and Depressed (08/03/2015).   Presented with patient has been confused per family not been acting herself. She resides at [facility] ever since her discharge on 26 of January. Family states she is sluggish to respond.  She was seen by cardiology 4 days ago. Per cardiology note should her weight has gone up 12 pounds. Unknown she was seen by cardiology and an 9 of February was diagnosed with acute on chronic diastolic heart failure her Lasix has recently been increased to 40 mg daily from 20. There was some question she may have right subclavian vein occlusion per cardiology note the plan to just keep her arm elevated. Patient denies any fever or chest pain or shortness of breath she had mild dysuria but otherwise no other complaints   IN ER: Rectal temperature down to 93.3 her creatinine noted to be elevated at 1.69 lactic acid within normal limits  blood pressure stable at 113/51 she was thought to have possible sepsis and started on broad-spectrum antibiotics with vancomycin and Zosyn   Regarding pertinent past history: coronary artery disease cardiac bypass in 2013 and since then required repeated percutaneous interventions as history of diastolic heart failure echogram from August 2016 showing EF of 55-60% and complete heart block requiring pacemaker placement. She had been admitted in the past or heart failure And was discharged in place but continued to have worsening lower extremity edema. She doesn't history of diabetes complicated by stage II chronic kidney disease   Hospitalist was called for admission for presumed sepsis given hypothermia acute on chronic worsening renal function  Review of Systems:    Pertinent positives include: Dysuria, decreased responsiveness, confusion somnolence  Constitutional:  No weight loss, night sweats, Fevers, chills, fatigue, weight loss  HEENT:  No headaches, Difficulty swallowing,Tooth/dental problems,Sore throat,  No sneezing, itching, ear ache, nasal congestion, post nasal drip,  Cardio-vascular:  No chest pain, Orthopnea, PND, anasarca, dizziness, palpitations.no Bilateral lower extremity swelling  GI:  No heartburn, indigestion, abdominal pain, nausea, vomiting, diarrhea, change in bowel habits, loss of appetite, melena, blood in stool, hematemesis Resp:  no shortness of breath at rest. No dyspnea on exertion, No excess mucus, no productive cough, No non-productive cough, No coughing up of blood.No change in color of mucus.No wheezing. Skin:  no rash or lesions. No jaundice GU:  no dysuria, change in color of urine, no urgency or frequency. No straining to urinate.  No flank pain.  Musculoskeletal:  No joint pain or no joint swelling. No decreased range of motion. No back pain.  Psych:  No change in mood or affect. No depression or anxiety. No memory loss.  Neuro: no localizing  neurological complaints, no tingling, no weakness, no double vision, no gait abnormality, no slurred speech   Otherwise ROS are negative except for above, 10 systems were reviewed  Past Medical History: Past Medical History  Diagnosis Date  . ST elevation myocardial infarction (STEMI) of inferior wall (HCC) 05/2011; 03/10/2012    a) 11/'12: 100% RCA, dLM 80% -- POBA of RCA (for planned CABG, not done until re-admission with NSTEMI 1/'13);; b) 8/'13: 100% SVG-RCA (PCI & re-PCI), native RCA 100%; Patent LIMA-p-mLAD, SVG-OM  . CAD S/P percutaneous coronary angioplasty 11/'12; 8/31 & 9/1/'13    a) MV: 100% RCA-POBA, dLM left main 80%; b) 1/'13: NSTEMI --> CABG; c) 2013: 8/31 - 100% RCA & acute SVG-RCA, 100% SVG-OM, 90% LM, 80% p&mLAD, ~70% RI, 50% Cx --> PCI-SVG-RCA: Promus Element DES x 3 (prox 2.5 mm x 38 mm & 2.5 mm x 16 mm, distal 2.5 mm x 16 mm); on 9/1 - accute in-stent Thrombosis - Aspiration thrombectomy & PTCA  . S/P CABG x 4 08/05/11    Dr. Tyrone Sage: LIMA-p-mLAD, SVG-RPDA, SVG-OM; known  100% SVG-OM, Extensive PTCA of SVG-RCA;;  Hospital course complicated by Afib & PNA; after d/c cellulitis of SVG harvest site due to edema  . Episodic atrial fibrillation (HCC) 07/2011    Post-Op CABG  . Ischemic cardiomyopathy 03/2012    Echo: EF 45-50% - Mild basal- mid inferolateral Hypokinesis: Gr 1 DD, mildly increased PAP.  Marland Kitchen Dyslipidemia, goal LDL below 70     statin intolerance (lipitor, crestor drug reaction); Welchol & Zetia  . Hypertension, essential   . DM (diabetes mellitus), type 2 with renal complications (HCC)   . Chronic kidney disease (CKD) stage G3b/A1, moderately decreased glomerular filtration rate (GFR) between 30-44 mL/min/1.73 square meter and albuminuria creatinine ratio less than 30 mg/g   . History of pneumonia     post-op from CABG  . Hypothyroidism     on synthroid  . Headache(784.0)   . Osteoarthritis   . Anxiety     PRN Xanax   . Dermatophytosis of nail   . Gout     on  Allopurinol  . Lower extremity edema     chronic  . GERD (gastroesophageal reflux disease) 04/26/2015  . Acute CHF (congestive heart failure) (HCC) 06/24/2015  . Delirium 07/30/2015  . Presence of permanent cardiac pacemaker   . Depressed 08/03/2015   Past Surgical History  Procedure Laterality Date  . Cardiac catheterization  November 2012; August and September 2013    Patent LIMA-LAD, patent she had an OM, patent stented SVG-RCA; occluded native RCA, 90% ostial left main, tandem 80% proximal and mid LAD, 70% mid Circumflex  . Coronary angioplasty  November 2012    PTCA only of RCA and setting of inferior STEMI  . Coronary artery bypass graft  08/05/2011    Procedure: CORONARY ARTERY BYPASS GRAFTING (CABG);  Surgeon: [name], MD;  Location: [facility] OR;  Service: Open Heart Surgery;  Laterality: N/A;  coronary artery bypass graft times 4 using left internal mammary artery and right leg saphenous vein harvested endoscopically  . Joint replacement      both hips replaced  . I&d extremity  09/21/2011    Procedure: IRRIGATION AND DEBRIDEMENT EXTREMITY;  Surgeon: [name], MD;  Location: [facility] OR;  Service: Vascular;  Laterality: Right;  with wound vac placement  . Coronary angioplasty with stent placement  03/10/2012     infferior STEMI: Occluded native RCA and SVG-RCA --> thrombectomy and 3  stent placement to the SVG-RCA (2.5 mm right 38 mm and 2 proximal distal overlapping 2.5 mm x 16 mm Promus DES stents:  . Coronary angioplasty  03/11/2012    Inferior STEMI #3: Reoccluded SVG-RCA; extensive thrombectomy and post dilation PTCA  . Transthoracic echocardiogram  September 2013    EF 45-50%, mild hypokinesis of the basal and mid inferolateral wall, grade 1 diastolic dysfunction, mildly increased artery pressures.  . Left heart catheterization with coronary angiogram N/A 05/26/2011    Procedure: LEFT HEART CATHETERIZATION WITH CORONARY ANGIOGRAM;  Surgeon: Marykay Lex, MD;  Location:  [facility] CATH LAB;  Service: Cardiovascular;  Laterality: N/A;  . Percutaneous coronary intervention-balloon only N/A 05/26/2011    Procedure: PERCUTANEOUS CORONARY INTERVENTION-BALLOON ONLY;  Surgeon: Marykay Lex, MD;  Location: [facility] CATH LAB;  Service: Cardiovascular;  Laterality: N/A;  . Left heart cath N/A 03/10/2012    Procedure: LEFT HEART CATH;  Surgeon: Herby Abraham, MD;  Location: [facility] CATH LAB;  Service: Cardiovascular;  Laterality: N/A;  . Percutaneous coronary stent intervention (pci-s)  03/10/2012    Procedure: PERCUTANEOUS CORONARY STENT INTERVENTION (PCI-S);  Surgeon: Herby Abraham, MD;  Location[facility] LAB;  Service: Cardiovascular;;  . Left heart catheterization with coronary angiogram Bilateral 03/11/2012    Procedure: LEFT HEART CATHETERIZATION WITH CORONARY ANGIOGRAM;  Surgeon: Thurmon Fair, MD;  Location: [facility] CATH LAB;  Service: Cardiovascular;  Laterality: Bilateral;  . Ep implantable device N/A 02/24/2015    Procedure: Pacemaker Implant;  Surgeon: Thurmon Fair, MD;  Location: [facility] INVASIVE CV LAB;  Service: Cardiovascular;  Laterality: N/A;  . Ep implantable device N/A 04/17/2015    Procedure: Lead Extraction, Can Extraction;  Surgeon: Marinus Maw, MD;  Location: [facility] INVASIVE CV LAB;  Service: Cardiovascular;  Laterality: N/A;  . Cardiac catheterization N/A 04/17/2015    Procedure: Temporary Pacemaker;  Surgeon: Marinus Maw, MD;  Location: [facility] INVASIVE CV LAB;  Service: Cardiovascular;  Laterality: N/A;  . Ep implantable device N/A 04/21/2015    Procedure: Pacemaker Implant;  Surgeon: Marinus Maw, MD;  Location: [facility] INVASIVE CV LAB;  Service: Cardiovascular;  Laterality: N/A;  . Pacemaker lead removal  04/21/2015    Procedure: Pacemaker Lead Removal;  Surgeon: Marinus Maw, MD;  Location: [facility] INVASIVE CV LAB;  Service: Cardiovascular;;  LV      Medications: Prior to Admission medications   Medication Sig Start Date End Date Taking? Authorizing Provider    acetaminophen (TYLENOL) 325 MG tablet Take 650 mg by mouth 2 (two) times daily.   Yes Historical Provider, MD  allopurinol (ZYLOPRIM) 100 MG tablet Take 100 mg by mouth daily.    Yes Historical Provider, MD  aspirin 81 MG tablet Take 1 tablet (81 mg total) by mouth daily. 08/20/15  Yes Mihai Croitoru, MD  carvedilol (COREG) 6.25 MG tablet Take 1 tablet (6.25 mg total) by mouth 2 (two) times daily with a meal. 08/15/14  Yes Marykay Lex, MD  colesevelam [facility]) 625 MG tablet Take 1,875 mg by mouth See admin instructions. Take 3 tablets (1875 mg) with breakfast and with lunch   Yes Historical Provider, MD  docusate sodium (COLACE) 100 MG capsule Take 100 mg by mouth daily.   Yes Historical Provider, MD  ferrous sulfate 325 (65 FE) MG tablet Take 1 tablet (325 mg total) by mouth daily with breakfast. 08/04/15  Yes Rolly Salter, MD  FLUoxetine (PROZAC) 10 MG capsule Take 1 capsule (10 mg total) by mouth daily. 08/03/15  Yes Rolly Salter, MD  folic acid (FOLVITE) 1 MG tablet Take 1 tablet (1 mg total) by mouth daily. 08/03/15  Yes Rolly Salter, MD  furosemide (LASIX) 20 MG tablet Take 2 tablets (40 mg total) by mouth daily. 08/20/15  Yes Mihai Croitoru, MD  levothyroxine (SYNTHROID, LEVOTHROID) 100 MCG tablet Take 100 mcg by mouth daily.    Yes Historical Provider, MD  megestrol (MEGACE) 400 MG/10ML suspension Take 10 mLs (400 mg total) by mouth daily. 08/04/15  Yes Rolly Salter, MD  mirtazapine (REMERON) 15 MG tablet Take 1 tablet (15 mg total) by mouth at bedtime. 08/03/15  Yes Rolly Salter, MD  pantoprazole (PROTONIX) 40 MG tablet Take 40 mg by mouth daily. 09/24/14  Yes Historical Provider, MD  polyethylene glycol (MIRALAX / GLYCOLAX) packet Take 17 g by mouth daily as needed for moderate constipation.   Yes Historical Provider, MD  polyvinyl alcohol (LIQUIFILM TEARS) 1.4 % ophthalmic solution Place 1 drop into both eyes 3 (three) times daily.   Yes Historical Provider, MD  potassium chloride  (K-DUR) 10 MEQ tablet Take 1 tablet (10 mEq total) by mouth daily. 08/20/15  Yes Thurmon Fair, MD    Allergies:   Allergies  Allergen Reactions  . Statins Rash  . Tape Rash    Please use paper tape    Social History:  Ambulatory   walker     From facility [facility]   reports that she has never smoked. She has never used smokeless tobacco. She reports that she does not drink alcohol or use illicit drugs.     Family History: family history includes Breast cancer in her mother; Heart disease in her father.    Physical Exam: Patient Vitals for the past 24 hrs:  BP Temp Temp src Pulse Resp SpO2  08/24/15 1900 (!) 113/51 mmHg - - 65 15 98 %  08/24/15 1845 115/67 mmHg - - (!) 59 17 97 %  08/24/15 1830 118/57 mmHg - - 62 15 99 %  08/24/15 1815 (!) 111/51 mmHg - - (!) 59 14 98 %  08/24/15 1800 111/60 mmHg - - (!) 59 14 98 %  08/24/15 1715 120/59 mmHg - - 60 15 99 %  08/24/15 1700 116/84 mmHg - - 60 15 99 %  08/24/15 1649 125/61 mmHg (!) 93.3 F (34.1 C) Rectal 61 20 99 %  08/24/15 1645 125/61 mmHg - - - 16 -    1. General:  in No Acute distress 2. Psychological: Alert and  Oriented 3. Head/ENT:     Dry Mucous Membranes                          Head Non traumatic, neck supple                          Normal  Dentition 4. SKIN:   Normal Skin turgor,  Skin clean Dry and intact no rash 5. Heart: Regular rate and rhythm no Murmur, Rub or gallop pacemaker in place 6. Lungs: Clear to auscultation bilaterally, no wheezes or crackles   7. Abdomen: Soft, non-tender, Non distended 8. Lower extremities: no clubbing, cyanosis, trace edema bilaterally 9. Neurologically Grossly intact, moving all 4 extremities equally 10. MSK: Normal range of motion  body mass index is unknown because there is no weight on file.   Labs on Admission:   Results for orders placed or performed  during the hospital encounter of 08/24/15 (from the past 24 hour(s))  CBG monitoring, ED     Status:  None   Collection Time: 08/24/15  5:09 PM  Result Value Ref Range   Glucose-Capillary 68 65 - 99 mg/dL  Comprehensive metabolic panel     Status: Abnormal   Collection Time: 08/24/15  5:15 PM  Result Value Ref Range   Sodium 145 135 - 145 mmol/L   Potassium 5.0 3.5 - 5.1 mmol/L   Chloride 122 (H) 101 - 111 mmol/L   CO2 16 (L) 22 - 32 mmol/L   Glucose, Bld 74 65 - 99 mg/dL   BUN 24 (H) 6 - 20 mg/dL   Creatinine, Ser 1.61 (H) 0.44 - 1.00 mg/dL   Calcium 9.2 8.9 - 09.6 mg/dL   Total Protein 5.1 (L) 6.5 - 8.1 g/dL   Albumin 2.4 (L) 3.5 - 5.0 g/dL   AST 14 (L) 15 - 41 U/L   ALT 10 (L) 14 - 54 U/L   Alkaline Phosphatase 108 38 - 126 U/L   Total Bilirubin 0.4 0.3 - 1.2 mg/dL   GFR calc non Af Amer 25 (L) >60 mL/min   GFR calc Af Amer 29 (L) >60 mL/min   Anion gap 7 5 - 15  CBC with Differential     Status: Abnormal   Collection Time: 08/24/15  5:15 PM  Result Value Ref Range   WBC 6.1 4.0 - 10.5 K/uL   RBC 3.86 (L) 3.87 - 5.11 MIL/uL   Hemoglobin 9.9 (L) 12.0 - 15.0 g/dL   HCT 04.5 (L) 40.9 - 81.1 %   MCV 81.9 78.0 - 100.0 fL   MCH 25.6 (L) 26.0 - 34.0 pg   MCHC 31.3 30.0 - 36.0 g/dL   RDW 91.4 (H) 78.2 - 95.6 %   Platelets 108 (L) 150 - 400 K/uL   Neutrophils Relative % 74 %   Lymphocytes Relative 15 %   Monocytes Relative 10 %   Eosinophils Relative 1 %   Basophils Relative 0 %   Neutro Abs 4.5 1.7 - 7.7 K/uL   Lymphs Abs 0.9 0.7 - 4.0 K/uL   Monocytes Absolute 0.6 0.1 - 1.0 K/uL   Eosinophils Absolute 0.1 0.0 - 0.7 K/uL   Basophils Absolute 0.0 0.0 - 0.1 K/uL   RBC Morphology POLYCHROMASIA PRESENT   Brain natriuretic peptide     Status: Abnormal   Collection Time: 08/24/15  5:15 PM  Result Value Ref Range   B Natriuretic Peptide 869.5 (H) 0.0 - 100.0 pg/mL  I-Stat Troponin, ED (not at [facility])     Status: None   Collection Time: 08/24/15  5:25 PM  Result Value Ref Range   Troponin i, poc 0.02 0.00 - 0.08 ng/mL   Comment 3          I-Stat CG4 Lactic Acid, ED     Status:  None   Collection Time: 08/24/15  5:28 PM  Result Value Ref Range   Lactic Acid, Venous 0.58 0.5 - 2.0 mmol/L  Urinalysis, Routine w reflex microscopic (not at [facility])     Status: None   Collection Time: 08/24/15  6:51 PM  Result Value Ref Range   Color, Urine YELLOW YELLOW   APPearance CLEAR CLEAR   Specific Gravity, Urine 1.019 1.005 - 1.030   pH 5.0 5.0 - 8.0   Glucose, UA NEGATIVE NEGATIVE mg/dL   Hgb urine dipstick NEGATIVE NEGATIVE   Bilirubin Urine NEGATIVE NEGATIVE   Ketones,  ur NEGATIVE NEGATIVE mg/dL   Protein, ur NEGATIVE NEGATIVE mg/dL   Nitrite NEGATIVE NEGATIVE   Leukocytes, UA NEGATIVE NEGATIVE    UA no evidence of UTI  Lab Results  Component Value Date   HGBA1C 5.9* 02/23/2015    Estimated Creatinine Clearance: 18.7 mL/min (by C-G formula based on Cr of 1.69).  BNP (last 3 results) No results for input(s): PROBNP in the last 8760 hours.  Other results:  I have pearsonaly reviewed this: ECG REPORT  Rate: 65  Rhythm: Paced ST&T Change: no Changes QTC 456  There were no vitals filed for this visit.   Cultures:    Component Value Date/Time   SDES BLOOD LEFT HAND 07/30/2015 0115   SPECREQUEST IN PEDIATRIC BOTTLE 07/30/2015 0115   CULT NO GROWTH 5 DAYS 07/30/2015 0115   REPTSTATUS 08/04/2015 FINAL 07/30/2015 0115     Radiological Exams on Admission: Dg Chest Portable 1 View  08/24/2015  CLINICAL DATA:  Altered mental status, extensive cardiac history EXAM: PORTABLE CHEST 1 VIEW COMPARISON:  08/02/2015 FINDINGS: Cardiac enlargement right-sided pacemaker status post CABG all stable. Blunting right costophrenic angle stable. Mild bibasilar atelectasis. No evidence of pulmonary edema. Right shoulder arthropathy. IMPRESSION: Mild atelectasis with numerous chronic findings as described above Electronically Signed   By: Esperanza Heir M.D.   On: 08/24/2015 18:36    Chart has been reviewed  Family  at  Bedside  plan of care was discussed with    Daughter [name] [phone]   Assessment/Plan  82 year old female history of ischemic cardiomyopathy, diabetes, coronary artery disease, complete heart block status post pacemaker placement presents with generalized fatigue and decreased responsiveness without evidence of hypothermia worrisome for SIRS. Discussed with family at this point leaning towards comfort care  Present on Admission:  . SIRS (systemic inflammatory response syndrome) (HCC) - no source of infection at this point lactic acid within normal limits patient appeared to be hypothermic initially was started on [facility] now doing better. Family at this point leaning towards comfort care okay with IV antibiotics and gentle fluids but avoid aggressive intervention ago avoid intensive care placement. Will order palliative care consult Acute encephalopathy  - the setting of SIRS, to help family with prognostication will obtain CT of the head given in mental status  . Ischemic cardiomyopathy, EF 45-50% 2D 03/13/12 -patient likely has decreased by mouth intake for the past few days, Lasix as been recently increased. Currently having evidence of worsening renal function. We will hold off on Lasix for tonight and give gentle fluids. Once patient improved in able to tolerate by mouth and becomes closer to her baseline would restart home regimen  . DM (diabetes mellitus), type 2 with renal complications (HCC) order sliding scale  . CAD (coronary artery disease) of bypass graft, along with subtotal left main occlusion and and RCA occlusion - graft dependent  - discussed with family at this point leaning towards comfort care avoid over aggressive interventions  . Anemia due to poor nutrition appears to be at baseline continue to monitor  . Acute renal failure superimposed on stage 3 chronic kidney disease (HCC) - slightly secondary to decreased by mouth intake . Dehydration secondary to decreased by mouth intake.  will will hold Lasix and  gently rehydrate   right arm edema - will keep elevated cardiology felt that this is secondary to venous occlusion. Family would like to get an ultrasound confirmed will order discussed her family likely patient will not be a candidate  for any significant intervention   Prophylaxis:   Lovenox   CODE STATUS:    DNR/DNI as per family leaning towards transition to comfort care palliative care consult would be beneficial  Disposition:                            Back to current facility when stable                             Other plan as per orders.  I have spent a total of 61 min on this admission    Abdelaziz Westenberger 08/24/2015, 9:33 PM    [facility]  Pager [phone]   after 2 AM please page floor coverage PA If 7AM-7PM, please contact the day team taking care of the patient  [email or web address]  Password [email or web address]

## 2015-08-24 NOTE — Telephone Encounter (Signed)
New message      Daughter is taking pt home from [place] [facility].  Daughter says patient is very swollen, and not "coherent".  D[facility] says [facility] has not been giving patient her proper medication and dosage.  Daughter want to talk to someone now because she states she cannot take her mother home in this condition

## 2015-08-24 NOTE — ED Provider Notes (Signed)
CSN: [id number]     Arrival date & time 08/24/15  1637 History   First MD Initiated Contact with Patient 08/24/15 1638     Chief Complaint  Patient presents with  . Weakness     (Consider location/radiation/quality/duration/timing/severity/associated sxs/prior Treatment) HPI   [name] is a 82 y.o. female with PMH significant for STEMI (2012), CAD, ischemic cardiomyopathy with pacemaker, HTN, DM, CKD, chronic LE edema, GERD,  who presents with gradual onset, constant, worsening generalized weakness since last night.  Patient coming from [facility] after she was responding to questions slowly, and MD recommended transfer out.  She reports decreased PO intake and states she has eaten very little in the past 2 days. Patient endorses dysuria and SOB (chronic).  Denies fever, CP, cough, N/V, abdominal pain, hematuria, bloody stools.   Past Medical History  Diagnosis Date  . ST elevation myocardial infarction (STEMI) of inferior wall (HCC) 05/2011; 03/10/2012    a) 11/'12: 100% RCA, dLM 80% -- POBA of RCA (for planned CABG, not done until re-admission with NSTEMI 1/'13);; b) 8/'13: 100% SVG-RCA (PCI & re-PCI), native RCA 100%; Patent LIMA-p-mLAD, SVG-OM  . CAD S/P percutaneous coronary angioplasty 11/'12; 8/31 & 9/1/'13    a) MV: 100% RCA-POBA, dLM left main 80%; b) 1/'13: NSTEMI --> CABG; c) 2013: 8/31 - 100% RCA & acute SVG-RCA, 100% SVG-OM, 90% LM, 80% p&mLAD, ~70% RI, 50% Cx --> PCI-SVG-RCA: Promus Element DES x 3 (prox 2.5 mm x 38 mm & 2.5 mm x 16 mm, distal 2.5 mm x 16 mm); on 9/1 - accute in-stent Thrombosis - Aspiration thrombectomy & PTCA  . S/P CABG x 4 08/05/11    Dr. Tyrone Sage: LIMA-p-mLAD, SVG-RPDA, SVG-OM; known  100% SVG-OM, Extensive PTCA of SVG-RCA;;  Hospital course complicated by Afib & PNA; after d/c cellulitis of SVG harvest site due to edema  . Episodic atrial fibrillation (HCC) 07/2011    Post-Op CABG  . Ischemic cardiomyopathy 03/2012    Echo: EF 45-50% - Mild basal-  mid inferolateral Hypokinesis: Gr 1 DD, mildly increased PAP.  Marland Kitchen Dyslipidemia, goal LDL below 70     statin intolerance (lipitor, crestor drug reaction); Welchol & Zetia  . Hypertension, essential   . DM (diabetes mellitus), type 2 with renal complications (HCC)   . Chronic kidney disease (CKD) stage G3b/A1, moderately decreased glomerular filtration rate (GFR) between 30-44 mL/min/1.73 square meter and albuminuria creatinine ratio less than 30 mg/g   . History of pneumonia     post-op from CABG  . Hypothyroidism     on synthroid  . Headache(784.0)   . Osteoarthritis   . Anxiety     PRN Xanax   . Dermatophytosis of nail   . Gout     on Allopurinol  . Lower extremity edema     chronic  . GERD (gastroesophageal reflux disease) 04/26/2015  . Acute CHF (congestive heart failure) (HCC) 06/24/2015  . Delirium 07/30/2015  . Presence of permanent cardiac pacemaker   . Depressed 08/03/2015   Past Surgical History  Procedure Laterality Date  . Cardiac catheterization  November 2012; August and September 2013    Patent LIMA-LAD, patent she had an OM, patent stented SVG-RCA; occluded native RCA, 90% ostial left main, tandem 80% proximal and mid LAD, 70% mid Circumflex  . Coronary angioplasty  November 2012    PTCA only of RCA and setting of inferior STEMI  . Coronary artery bypass graft  08/05/2011    Procedure: CORONARY ARTERY BYPASS  GRAFTING (CABG);  Surgeon: [name], MD;  Location: [facility] OR;  Service: Open Heart Surgery;  Laterality: N/A;  coronary artery bypass graft times 4 using left internal mammary artery and right leg saphenous vein harvested endoscopically  . Joint replacement      both hips replaced  . I&d extremity  09/21/2011    Procedure: IRRIGATION AND DEBRIDEMENT EXTREMITY;  Surgeon: [name], MD;  Location: [facility] OR;  Service: Vascular;  Laterality: Right;  with wound vac placement  . Coronary angioplasty with stent placement  03/10/2012     infferior STEMI:  Occluded native RCA and SVG-RCA --> thrombectomy and 3 stent placement to the SVG-RCA (2.5 mm right 38 mm and 2 proximal distal overlapping 2.5 mm x 16 mm Promus DES stents:  . Coronary angioplasty  03/11/2012    Inferior STEMI #3: Reoccluded SVG-RCA; extensive thrombectomy and post dilation PTCA  . Transthoracic echocardiogram  September 2013    EF 45-50%, mild hypokinesis of the basal and mid inferolateral wall, grade 1 diastolic dysfunction, mildly increased artery pressures.  . Left heart catheterization with coronary angiogram N/A 05/26/2011    Procedure: LEFT HEART CATHETERIZATION WITH CORONARY ANGIOGRAM;  Surgeon: Marykay Lex, MD;  Location[facility] CATH LAB;  Service: Cardiovascular;  Laterality: N/A;  . Percutaneous coronary intervention-balloon only N/A 05/26/2011    Procedure: PERCUTANEOUS CORONARY INTERVENTION-BALLOON ONLY;  Surgeon: Marykay Lex, MD;  Location: [facility] CATH LAB;  Service: Cardiovascular;  Laterality: N/A;  . Left heart cath N/A 03/10/2012    Procedure: LEFT HEART CATH;  Surgeon: Herby Abraham, MD;  Location: [facility] CATH LAB;  Service: Cardiovascular;  Laterality: N/A;  . Percutaneous coronary stent intervention (pci-s)  03/10/2012    Procedure: PERCUTANEOUS CORONARY STENT INTERVENTION (PCI-S);  Surgeon: Herby Abraham, MD;  Location: [facility] CATH LAB;  Service: Cardiovascular;;  . Left heart catheterization with coronary angiogram Bilateral 03/11/2012    Procedure: LEFT HEART CATHETERIZATION WITH CORONARY ANGIOGRAM;  Surgeon: Thurmon Fair, MD;  Location: [facility] CATH LAB;  Service: Cardiovascular;  Laterality: Bilateral;  . Ep implantable device N/A 02/24/2015    Procedure: Pacemaker Implant;  Surgeon: Thurmon Fair, MD;  Location: [facility] INVASIVE CV LAB;  Service: Cardiovascular;  Laterality: N/A;  . Ep implantable device N/A 04/17/2015    Procedure: Lead Extraction, Can Extraction;  Surgeon: Marinus Maw, MD;  Location: [facility] INVASIVE CV LAB;  Service: Cardiovascular;  Laterality: N/A;   . Cardiac catheterization N/A 04/17/2015    Procedure: Temporary Pacemaker;  Surgeon: Marinus Maw, MD;  Location: [facility] INVASIVE CV LAB;  Service: Cardiovascular;  Laterality: N/A;  . Ep implantable device N/A 04/21/2015    Procedure: Pacemaker Implant;  Surgeon: Marinus Maw, MD;  Location: [facility] INVASIVE CV LAB;  Service: Cardiovascular;  Laterality: N/A;  . Pacemaker lead removal  04/21/2015    Procedure: Pacemaker Lead Removal;  Surgeon: Marinus Maw, MD;  Location: [facility] INVASIVE CV LAB;  Service: Cardiovascular;;  LV    Family History  Problem Relation Age of Onset  . Breast cancer Mother   . Heart disease Father    Social History  Substance Use Topics  . Smoking status: Never Smoker   . Smokeless tobacco: Never Used  . Alcohol Use: No   OB History    No data available     Review of Systems All other systems negative unless otherwise stated in HPI    Allergies  Statins and Tape  Home Medications   Prior to Admission medications  Medication Sig Start Date End Date Taking? Authorizing Provider  acetaminophen (TYLENOL) 325 MG tablet Take 650 mg by mouth 2 (two) times daily.   Yes Historical Provider, MD  allopurinol (ZYLOPRIM) 100 MG tablet Take 100 mg by mouth daily.    Yes Historical Provider, MD  aspirin 81 MG tablet Take 1 tablet (81 mg total) by mouth daily. 08/20/15  Yes Mihai Croitoru, MD  carvedilol (COREG) 6.25 MG tablet Take 1 tablet (6.25 mg total) by mouth 2 (two) times daily with a meal. 08/15/14  Yes Marykay Lex, MD  colesevelam [facility]) 625 MG tablet Take 1,875 mg by mouth See admin instructions. Take 3 tablets (1875 mg) with breakfast and with lunch   Yes Historical Provider, MD  docusate sodium (COLACE) 100 MG capsule Take 100 mg by mouth daily.   Yes Historical Provider, MD  ferrous sulfate 325 (65 FE) MG tablet Take 1 tablet (325 mg total) by mouth daily with breakfast. 08/04/15  Yes Rolly Salter, MD  FLUoxetine (PROZAC) 10 MG capsule Take 1 capsule  (10 mg total) by mouth daily. 08/03/15  Yes Rolly Salter, MD  folic acid (FOLVITE) 1 MG tablet Take 1 tablet (1 mg total) by mouth daily. 08/03/15  Yes Rolly Salter, MD  furosemide (LASIX) 20 MG tablet Take 2 tablets (40 mg total) by mouth daily. 08/20/15  Yes Mihai Croitoru, MD  levothyroxine (SYNTHROID, LEVOTHROID) 100 MCG tablet Take 100 mcg by mouth daily.    Yes Historical Provider, MD  megestrol (MEGACE) 400 MG/10ML suspension Take 10 mLs (400 mg total) by mouth daily. 08/04/15  Yes Rolly Salter, MD  mirtazapine (REMERON) 15 MG tablet Take 1 tablet (15 mg total) by mouth at bedtime. 08/03/15  Yes Rolly Salter, MD  pantoprazole (PROTONIX) 40 MG tablet Take 40 mg by mouth daily. 09/24/14  Yes Historical Provider, MD  polyethylene glycol (MIRALAX / GLYCOLAX) packet Take 17 g by mouth daily as needed for moderate constipation.   Yes Historical Provider, MD  polyvinyl alcohol (LIQUIFILM TEARS) 1.4 % ophthalmic solution Place 1 drop into both eyes 3 (three) times daily.   Yes Historical Provider, MD  potassium chloride (K-DUR) 10 MEQ tablet Take 1 tablet (10 mEq total) by mouth daily. 08/20/15  Yes Mihai Croitoru, MD   BP 113/51 mmHg  Pulse 65  Temp(Src) 93.3 F (34.1 C) (Rectal)  Resp 15  SpO2 98% Physical Exam  Constitutional: She is oriented to person, place, and time. She appears well-developed and well-nourished. She has a sickly appearance.  HENT:  Head: Normocephalic and atraumatic.  Mouth/Throat: Oropharynx is clear and moist.  Eyes: Conjunctivae are normal. Pupils are equal, round, and reactive to light.  Neck: Normal range of motion. Neck supple.  Cardiovascular: Normal rate, regular rhythm and normal heart sounds.   No murmur heard. 2+ pitting lower extremity edema to the knees bilaterally.   Pulmonary/Chest: Effort normal. No accessory muscle usage or stridor. No respiratory distress. She has no wheezes. She has no rhonchi. She has no rales.  Abdominal: Soft. Bowel sounds are  normal. She exhibits no distension. There is no tenderness.  Musculoskeletal: Normal range of motion.  Lymphadenopathy:    She has no cervical adenopathy.  Neurological: She is alert and oriented to person, place, and time.  Speech clear without dysarthria. No cranial nerve deficit.  Strength and sensation intact bilaterally throughout upper and lower extremities. No pronator drift.   Skin: Skin is warm and dry.  Psychiatric: She has  a normal mood and affect. Her behavior is normal.    ED Course  Procedures (including critical care time) Labs Review Labs Reviewed  COMPREHENSIVE METABOLIC PANEL - Abnormal; Notable for the following:    Chloride 122 (*)    CO2 16 (*)    BUN 24 (*)    Creatinine, Ser 1.69 (*)    Total Protein 5.1 (*)    Albumin 2.4 (*)    AST 14 (*)    ALT 10 (*)    GFR calc non Af Amer 25 (*)    GFR calc Af Amer 29 (*)    All other components within normal limits  CBC WITH DIFFERENTIAL/PLATELET - Abnormal; Notable for the following:    RBC 3.86 (*)    Hemoglobin 9.9 (*)    HCT 31.6 (*)    MCH 25.6 (*)    RDW 24.2 (*)    Platelets 108 (*)    All other components within normal limits  BRAIN NATRIURETIC PEPTIDE - Abnormal; Notable for the following:    B Natriuretic Peptide 869.5 (*)    All other components within normal limits  CULTURE, BLOOD (ROUTINE X 2)  CULTURE, BLOOD (ROUTINE X 2)  URINE CULTURE  URINALYSIS, ROUTINE W REFLEX MICROSCOPIC (NOT AT [facility])  CBG MONITORING, ED  I-STAT TROPOININ, ED  I-STAT CG4 LACTIC ACID, ED    Imaging Review Dg Chest Portable 1 View  08/24/2015  CLINICAL DATA:  Altered mental status, extensive cardiac history EXAM: PORTABLE CHEST 1 VIEW COMPARISON:  08/02/2015 FINDINGS: Cardiac enlargement right-sided pacemaker status post CABG all stable. Blunting right costophrenic angle stable. Mild bibasilar atelectasis. No evidence of pulmonary edema. Right shoulder arthropathy. IMPRESSION: Mild atelectasis with numerous chronic  findings as described above Electronically Signed   By: Esperanza Heir M.D.   On: 08/24/2015 18:36   I have personally reviewed and evaluated these images and lab results as part of my medical decision-making.   EKG Interpretation   Date/Time:  Monday August 24 2015 16:42:50 EST Ventricular Rate:  65 PR Interval:  56 QRS Duration: 150 QT Interval:  458 QTC Calculation: 476 R Axis:   -70 Text Interpretation:  A-V dual-paced rhythm with some inhibition No  further analysis attempted due to paced rhythm Baseline wander in lead(s)  V3 No significant change since last tracing Confirmed by JACUBOWITZ  MD,  SAM [phone]) on 08/24/2015 4:59:09 PM      MDM   Final diagnoses:  Sepsis, due to unspecified organism (HCC)  Hypothermia, initial encounter  Dehydration    Patient presents with generalized weakness x 1 day.  Decreased PO intake.  Hypothermic with rectal temp 93.3; otherwise, VSS.  Normal neurological exam, abdomen soft and benign.   Lactic acid 0.58 Troponin negative.  EKG non-acute. BNP 869.5.  CXR shows mild atelectasis. CMP shows Cl 122, CO2 16, Cr 1.69  CBC WBC 6.6, Hgb 9.9 UA negative  Patient given IVF, Vanc and Zosyn for undifferentiated sepsis with worsening renal function, hypothermia, and dehydration.  Admit to medicine, Dr. Adela Glimpse.  Case has been discussed with and seen by Dr. Ethelda Chick who agrees with the above plan for admission.     Cheri Fowler, PA-C 08/24/15 1941  Cheri Fowler, PA-C 08/24/15 1955  Doug Sou, MD 08/25/15 [id number]

## 2015-08-24 NOTE — Telephone Encounter (Signed)
DAUGHTER STATES PATIENT IS NOT HERSELF INCOHERENT , STILL EDEMATOUS, DAUGHTER WAS AT ASSISTED LIVING T O TAKE MOTHER HOME. DAUGHTER STATES MOTHER IS TOTAL DIFFERENT - STAFF NEED TO BATHE PATIENT  CHANGE CLOTHING DUE TO USUAL CLOTHING IS TO TIGHT.  RN informed daughter to call EMS TO TAKE TO  [place]. SHE VERBALIZED UNDERSTANDING NOTIFIED TRISH - CARDMASTER

## 2015-08-24 NOTE — ED Notes (Signed)
EDP aware of pt's rectal temp.

## 2015-08-24 NOTE — ED Notes (Signed)
Per EMS:  Pt arrives from [facility] after family states she was responding to questions slowly, and the MD recommended transfer out.  EMS states pt is AxO x 4.  They noted edema to bilateral lower legs.

## 2015-08-25 ENCOUNTER — Observation Stay (HOSPITAL_COMMUNITY): Payer: Medicare Other

## 2015-08-25 ENCOUNTER — Encounter (HOSPITAL_COMMUNITY): Payer: Self-pay | Admitting: General Practice

## 2015-08-25 DIAGNOSIS — N179 Acute kidney failure, unspecified: Secondary | ICD-10-CM

## 2015-08-25 DIAGNOSIS — R1310 Dysphagia, unspecified: Secondary | ICD-10-CM | POA: Diagnosis not present

## 2015-08-25 DIAGNOSIS — G9340 Encephalopathy, unspecified: Secondary | ICD-10-CM | POA: Diagnosis not present

## 2015-08-25 DIAGNOSIS — E860 Dehydration: Secondary | ICD-10-CM

## 2015-08-25 DIAGNOSIS — E1122 Type 2 diabetes mellitus with diabetic chronic kidney disease: Secondary | ICD-10-CM | POA: Diagnosis not present

## 2015-08-25 DIAGNOSIS — Z515 Encounter for palliative care: Secondary | ICD-10-CM

## 2015-08-25 DIAGNOSIS — N183 Chronic kidney disease, stage 3 (moderate): Secondary | ICD-10-CM | POA: Diagnosis not present

## 2015-08-25 DIAGNOSIS — D539 Nutritional anemia, unspecified: Secondary | ICD-10-CM | POA: Diagnosis not present

## 2015-08-25 DIAGNOSIS — Z7189 Other specified counseling: Secondary | ICD-10-CM | POA: Insufficient documentation

## 2015-08-25 LAB — COMPREHENSIVE METABOLIC PANEL
ALBUMIN: 2.2 g/dL — AB (ref 3.5–5.0)
ALK PHOS: 102 U/L (ref 38–126)
ALT: 10 U/L — ABNORMAL LOW (ref 14–54)
ANION GAP: 10 (ref 5–15)
AST: 12 U/L — AB (ref 15–41)
BILIRUBIN TOTAL: 0.6 mg/dL (ref 0.3–1.2)
BUN: 21 mg/dL — ABNORMAL HIGH (ref 6–20)
CALCIUM: 9.2 mg/dL (ref 8.9–10.3)
CO2: 16 mmol/L — ABNORMAL LOW (ref 22–32)
Chloride: 122 mmol/L — ABNORMAL HIGH (ref 101–111)
Creatinine, Ser: 1.58 mg/dL — ABNORMAL HIGH (ref 0.44–1.00)
GFR calc Af Amer: 32 mL/min — ABNORMAL LOW (ref >60)
GFR, EST NON AFRICAN AMERICAN: 27 mL/min — AB (ref >60)
GLUCOSE: 75 mg/dL (ref 65–99)
POTASSIUM: 5.1 mmol/L (ref 3.5–5.1)
SODIUM: 148 mmol/L — AB (ref 135–145)
TOTAL PROTEIN: 5.3 g/dL — AB (ref 6.5–8.1)

## 2015-08-25 LAB — CBC
HEMATOCRIT: 30.6 % — AB (ref 36.0–46.0)
HEMOGLOBIN: 9.8 g/dL — AB (ref 12.0–15.0)
MCH: 26.7 pg (ref 26.0–34.0)
MCHC: 32 g/dL (ref 30.0–36.0)
MCV: 83.4 fL (ref 78.0–100.0)
Platelets: 129 10*3/uL — ABNORMAL LOW (ref 150–400)
RBC: 3.67 MIL/uL — ABNORMAL LOW (ref 3.87–5.11)
RDW: 24.6 % — AB (ref 11.5–15.5)
WBC: 6.1 10*3/uL (ref 4.0–10.5)

## 2015-08-25 LAB — GLUCOSE, CAPILLARY
GLUCOSE-CAPILLARY: 148 mg/dL — AB (ref 65–99)
GLUCOSE-CAPILLARY: 88 mg/dL (ref 65–99)
Glucose-Capillary: 76 mg/dL (ref 65–99)
Glucose-Capillary: 66 mg/dL (ref 65–99)

## 2015-08-25 LAB — MRSA PCR SCREENING: MRSA by PCR: NEGATIVE

## 2015-08-25 LAB — PREALBUMIN: PREALBUMIN: 25.9 mg/dL (ref 18–38)

## 2015-08-25 LAB — TSH: TSH: 0.551 u[IU]/mL (ref 0.350–4.500)

## 2015-08-25 LAB — MAGNESIUM: Magnesium: 2.1 mg/dL (ref 1.7–2.4)

## 2015-08-25 LAB — PHOSPHORUS: Phosphorus: 2.6 mg/dL (ref 2.5–4.6)

## 2015-08-25 MED ORDER — FUROSEMIDE 40 MG PO TABS
40.0000 mg | ORAL_TABLET | Freq: Every day | ORAL | Status: DC
  Administered 2015-08-26: 40 mg via ORAL
  Filled 2015-08-25: qty 1

## 2015-08-25 MED ORDER — DEXTROSE 50 % IV SOLN
INTRAVENOUS | Status: AC
  Administered 2015-08-25: 50 mL
  Filled 2015-08-25: qty 50

## 2015-08-25 MED ORDER — ALBUTEROL SULFATE (2.5 MG/3ML) 0.083% IN NEBU
INHALATION_SOLUTION | RESPIRATORY_TRACT | Status: AC
  Administered 2015-08-25: 2.5 mg
  Filled 2015-08-25: qty 3

## 2015-08-25 MED ORDER — ALBUTEROL SULFATE (2.5 MG/3ML) 0.083% IN NEBU
2.5000 mg | INHALATION_SOLUTION | Freq: Four times a day (QID) | RESPIRATORY_TRACT | Status: DC | PRN
  Administered 2015-08-26: 2.5 mg via RESPIRATORY_TRACT
  Filled 2015-08-25: qty 3

## 2015-08-25 MED ORDER — DEXTROSE-NACL 5-0.45 % IV SOLN
INTRAVENOUS | Status: DC
  Administered 2015-08-25: 19:00:00 via INTRAVENOUS

## 2015-08-25 MED ORDER — FUROSEMIDE 10 MG/ML IJ SOLN
INTRAMUSCULAR | Status: AC
  Filled 2015-08-25: qty 4

## 2015-08-25 MED ORDER — FUROSEMIDE 10 MG/ML IJ SOLN
40.0000 mg | Freq: Once | INTRAMUSCULAR | Status: AC
  Administered 2015-08-25: 40 mg via INTRAVENOUS

## 2015-08-25 NOTE — Care Management Note (Signed)
Case Management Note  Patient Details  Name: MYKENNA VIELE MRN: [id number] Date of Birth: 08-25-1920  Subjective/Objective:                 Met with family, they are unclear at this time if patient will DC with residential hospice or home hospice. Prognosis has not been defined for them. They are interested in [place] if residential, no preference at this time for home hospice. Patient continues to receive IV Abx, and family is looking to see if treatment helps in next day or so.    Action/Plan:   Family open and receptive to meeting again in AM to further plan discharge.  Expected Discharge Date:                  Expected Discharge Plan:   (Home Hosp vs. Residential Hospice)  In-House Referral:     Discharge planning Services     Post Acute Care Choice:    Choice offered to:     DME Arranged:    DME Agency:     HH Arranged:    HH Agency:     Status of Service:  In process, will continue to follow  Medicare Important Message Given:    Date Medicare IM Given:    Medicare IM give by:    Date Additional Medicare IM Given:    Additional Medicare Important Message give by:     If discussed at [place] of Stay Meetings, dates discussed:    Additional Comments:  Carles Collet, RN 08/25/2015, 2:42 PM

## 2015-08-25 NOTE — Progress Notes (Signed)
EEG Completed; Results Pending  

## 2015-08-25 NOTE — Progress Notes (Signed)
Hypoglycemic Event  CBG: 60  Treatment: D50 IV 50 mL  Symptoms: None  Follow-up CBG Result:148  Possible Reasons for Event: Inadequate meal intake   Yu Cragun, [place] M

## 2015-08-25 NOTE — Progress Notes (Signed)
Nutrition Brief Note  RD consulted to assess nutritional status/needs.   Pt sleeping soundly at time of visit. RN providing nursing care. No family present in room at time of visit.  Reviewed palliative care note from today; pt family is leaning towards comfort care. Pt's daughter would like to pursue home hospice or residential hospice at time of discharge.   Current diet order is Heart Healthy, patient is consuming approximately n/a% of meals at this time. Labs and medications reviewed.   No nutrition interventions warranted at this time. If nutrition issues arise, please consult RD.   Gayna Braddy A. Mayford Knife, RD, LDN, CDE Pager: [phone] After hours Pager: [phone]

## 2015-08-25 NOTE — Progress Notes (Signed)
CSW received consult that patient is from [facility].  Per palliative note the family's hope is for patient to return home with home hospice services if possible/vs DC to [facility] if pt is a candidate   CSW signing off for now but please reconsult if disposition changes  Merlyn Lot, [facility] Clinical Social Worker [phone]

## 2015-08-25 NOTE — Consult Note (Signed)
Consultation Note Date: 08/25/2015   Patient Name: [name]  DOB: 1921/03/26  MRN: [id number]  Age / Sex: 82 y.o., female  PCP: Darci Needle, MD Referring Physician: Leatha Gilding, MD  Reason for Consultation: Establishing goals of care    Clinical Assessment/Narrative: 82 year old female with complete heart block status post pacemaker placement, paroxysmal atrial fibrillation, diabetes mellitus, chronic kidney disease stage [name], mixed CHF, and hypothyroidism who has had 5 admissions in the past 6 months presents from SNF with altered mental status and sepsis from unknown etiology.  The patient is a DO NOT RESUSCITATE. She is lying comfortably in bed with IV fluids and antibiotics.  Per the nurse she is experiencing acute urinary retention. Approximately 1000 mL of urine was produced on an in and out cath 2/14.  I spoke with her daughter [name] at bedside.  Ms. Yetta Barre is realistic. She understands her mother may not wake up and that she was in the process of dying when she found her at the SNF. In the ER the patient had a temperature of 93. Ms. Yetta Barre tells me that on Saturday the patient was able to wheel herself around in a wheelchair at [facility]. She has been eating well. During a recent trip to her cardiologist office the patient was found to have gained over 12 pounds in fluid. Apparently she was not receiving her Lasix as it was prescribed. Mr. Yetta Barre feels her mother's medications have been mismanaged recently.  She does not want her mother to return to SNF.  She tells me that her father the patient's husband died on Jun 21, 2015.    While Ms. Whitsitt has multiple children (3?). Ms. Yetta Barre is the designated caretaker.  She lives in [place] but drove here to care for her father for several weeks when he passed, and is now caring for her mother. At the end of this hospitalization she would like  for her mother to be discharged to the patient's home with 24-hour care (Mrs. Yetta Barre cousin will stay) and hospice at home. Alternatively if the patient appears to be imminently dying she would like for her to be discharged to [facility] where the patient's husband recently passed.  Contacts/Participants in Discussion:  [name] and myself Primary Decision Maker: Daughter, [name]     SUMMARY OF RECOMMENDATIONS  Code Status/Advance Care Planning: DNR     Most Recent Value   Type of Advance Directive  Out of facility DNR (pink MOST or yellow form), Healthcare Power of Attorney   Pre-existing out of facility DNR order (yellow form or pink MOST form)     "MOST" Form in Place?        Symptom Management:   Per Primary Team  Palliative Prophylaxis:   Aspiration, Bowel Regimen and Turn Reposition  Additional Recommendations (Limitations, Scope, Preferences):  Home with hospice if possible, if not then [facility]  Palliative Medicine will follow along during this hospitalization to help the family with continued Goals of care  Psycho-social/Spiritual:  Support System: Adequate Desire for further Chaplaincy support Prognosis: < 2 weeks  Discharge Planning: Hospice facility likely   Chief Complaint/ Primary Diagnoses: Present on Admission:  . Ischemic cardiomyopathy, EF 45-50% 2D 03/13/12 . Edema . DM (diabetes mellitus), type 2 with renal complications (HCC) . CAD (coronary artery disease) of bypass graft, along with subtotal left main occlusion and and RCA occlusion - graft dependent . Anemia due to poor nutrition . Acute renal failure superimposed on stage 3  chronic kidney disease (HCC) . Sepsis (HCC) . Dehydration . SIRS (systemic inflammatory response syndrome) (HCC)  I have reviewed the medical record, interviewed the patient and family, and examined the patient. The following aspects are pertinent.  Past Medical History  Diagnosis Date  . ST elevation  myocardial infarction (STEMI) of inferior wall (HCC) 05/2011; 03/10/2012    a) 11/'12: 100% RCA, dLM 80% -- POBA of RCA (for planned CABG, not done until re-admission with NSTEMI 1/'13);; b) 8/'13: 100% SVG-RCA (PCI & re-PCI), native RCA 100%; Patent LIMA-p-mLAD, SVG-OM  . CAD S/P percutaneous coronary angioplasty 11/'12; 8/31 & 9/1/'13    a) MV: 100% RCA-POBA, dLM left main 80%; b) 1/'13: NSTEMI --> CABG; c) 2013: 8/31 - 100% RCA & acute SVG-RCA, 100% SVG-OM, 90% LM, 80% p&mLAD, ~70% RI, 50% Cx --> PCI-SVG-RCA: Promus Element DES x 3 (prox 2.5 mm x 38 mm & 2.5 mm x 16 mm, distal 2.5 mm x 16 mm); on 9/1 - accute in-stent Thrombosis - Aspiration thrombectomy & PTCA  . S/P CABG x 4 08/05/11    Dr. Tyrone Sage: LIMA-p-mLAD, SVG-RPDA, SVG-OM; known  100% SVG-OM, Extensive PTCA of SVG-RCA;;  Hospital course complicated by Afib & PNA; after d/c cellulitis of SVG harvest site due to edema  . Episodic atrial fibrillation (HCC) 07/2011    Post-Op CABG  . Ischemic cardiomyopathy 03/2012    Echo: EF 45-50% - Mild basal- mid inferolateral Hypokinesis: Gr 1 DD, mildly increased PAP.  Marland Kitchen Dyslipidemia, goal LDL below 70     statin intolerance (lipitor, crestor drug reaction); Welchol & Zetia  . Hypertension, essential   . DM (diabetes mellitus), type 2 with renal complications (HCC)   . Chronic kidney disease (CKD) stage G3b/A1, moderately decreased glomerular filtration rate (GFR) between 30-44 mL/min/1.73 square meter and albuminuria creatinine ratio less than 30 mg/g   . History of pneumonia     post-op from CABG  . Hypothyroidism     on synthroid  . Headache(784.0)   . Osteoarthritis   . Anxiety     PRN Xanax   . Dermatophytosis of nail   . Gout     on Allopurinol  . Lower extremity edema     chronic  . GERD (gastroesophageal reflux disease) 04/26/2015  . Acute CHF (congestive heart failure) (HCC) 06/24/2015  . Delirium 07/30/2015  . Presence of permanent cardiac pacemaker   . Depressed 08/03/2015    Social History   Social History  . Marital Status: Widowed    Spouse Name: N/A  . Number of Children: N/A  . Years of Education: N/A   Social History Main Topics  . Smoking status: Never Smoker   . Smokeless tobacco: Never Used  . Alcohol Use: No  . Drug Use: No  . Sexual Activity: No   Other Topics Concern  . 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 She is very active up and around the house, does not do routine exercise.   Does not smoke or drink.   Family History  Problem Relation Age of Onset  . Breast cancer Mother   . Heart disease Father    Scheduled Meds: . allopurinol  100 mg Oral Daily  . aspirin  81 mg Oral Daily  . carvedilol  6.25 mg Oral BID WC  . enoxaparin (LOVENOX) injection  30 mg Subcutaneous Q24H  . insulin aspart  0-9 Units Subcutaneous 6 times per day  . levothyroxine  100 mcg Oral QAC breakfast  . megestrol  400 mg Oral Daily  . pantoprazole  40 mg Oral Daily  . piperacillin-tazobactam (ZOSYN)  IV  2.25 g Intravenous Q8H  . polyvinyl alcohol  1 drop Both Eyes TID  . [START ON 08/26/2015] vancomycin  1,000 mg Intravenous Q48H   Continuous Infusions: . sodium chloride 50 mL/hr at 08/25/15 0237   PRN Meds:.acetaminophen **OR** acetaminophen, HYDROcodone-acetaminophen, ondansetron **OR** ondansetron (ZOFRAN) IV, polyethylene glycol Medications Prior to Admission:  Prior to Admission medications   Medication Sig Start Date End Date Taking? Authorizing Provider  acetaminophen (TYLENOL) 325 MG tablet Take 650 mg by mouth 2 (two) times daily.   Yes Historical Provider, MD  allopurinol (ZYLOPRIM) 100 MG tablet Take 100 mg by mouth daily.    Yes Historical Provider, MD  aspirin 81 MG tablet Take 1 tablet (81 mg total) by mouth daily. 08/20/15  Yes Mihai Croitoru, MD  carvedilol (COREG) 6.25 MG tablet Take 1 tablet (6.25 mg total)  by mouth 2 (two) times daily with a meal. 08/15/14  Yes Marykay Lex, MD  colesevelam [facility]) 625 MG tablet Take 1,875 mg by mouth See admin instructions. Take 3 tablets (1875 mg) with breakfast and with lunch   Yes Historical Provider, MD  docusate sodium (COLACE) 100 MG capsule Take 100 mg by mouth daily.   Yes Historical Provider, MD  ferrous sulfate 325 (65 FE) MG tablet Take 1 tablet (325 mg total) by mouth daily with breakfast. 08/04/15  Yes Rolly Salter, MD  FLUoxetine (PROZAC) 10 MG capsule Take 1 capsule (10 mg total) by mouth daily. 08/03/15  Yes Rolly Salter, MD  folic acid (FOLVITE) 1 MG tablet Take 1 tablet (1 mg total) by mouth daily. 08/03/15  Yes Rolly Salter, MD  furosemide (LASIX) 20 MG tablet Take 2 tablets (40 mg total) by mouth daily. 08/20/15  Yes Mihai Croitoru, MD  levothyroxine (SYNTHROID, LEVOTHROID) 100 MCG tablet Take 100 mcg by mouth daily.    Yes Historical Provider, MD  megestrol (MEGACE) 400 MG/10ML suspension Take 10 mLs (400 mg total) by mouth daily. 08/04/15  Yes Rolly Salter, MD  mirtazapine (REMERON) 15 MG tablet Take 1 tablet (15 mg total) by mouth at bedtime. 08/03/15  Yes Rolly Salter, MD  pantoprazole (PROTONIX) 40 MG tablet Take 40 mg by mouth daily. 09/24/14  Yes Historical Provider, MD  polyethylene glycol (MIRALAX / GLYCOLAX) packet Take 17 g by mouth daily as needed for moderate constipation.   Yes Historical Provider, MD  polyvinyl alcohol (LIQUIFILM TEARS) 1.4 % ophthalmic solution Place 1 drop into both eyes 3 (three) times daily.   Yes Historical Provider, MD  potassium chloride (K-DUR) 10 MEQ tablet Take 1 tablet (10 mEq total) by mouth daily. 08/20/15  Yes Thurmon Fair, MD   Allergies  Allergen Reactions  . Statins Rash  . Tape Rash    Please use paper tape    Review of Systems: Patient is not awake. Unable to obtain  Physical Exam  Well-developed, elderly female, nonresponsive in deep sleep appears comfortable CV: Regular rate and  rhythm Resp:  No increased work of breathing, no w/c/r Abdomen:  Soft.  Slight grimace with palpation Extremities:  Edema 1+ in all extremities.  Slight mottling, slightly cool.   Vital Signs: BP 121/50 mmHg  Pulse 62  Temp(Src) 97.3 F (36.3 C) (Oral)  Resp 15  SpO2 98%  SpO2: SpO2: 98 % O2 Device:SpO2: 98 % O2 Flow Rate: .   IO: Intake/output summary:  Intake/Output Summary (Last 24 hours) at 08/25/15 1610 Last data filed at 08/25/15 0845  Gross per 24 hour  Intake      0 ml  Output   1000 ml  Net  -1000 ml    LBM: Last BM Date: 08/24/15 Baseline Weight:   Most recent weight:        Palliative Assessment/Data:  Flowsheet Rows        Most Recent Value   Intake Tab    Referral Department  Hospitalist   Unit at Time of Referral  Med/Surg Unit   Palliative Care Primary Diagnosis  Cardiac   Date Notified  08/24/15   Palliative Care Type  New Palliative care   Reason for referral  Clarify Goals of Care   Date of Admission  08/24/15   Date first seen by Palliative Care  08/24/15   # of days Palliative referral response time  0 Day(s)   # of days IP prior to Palliative referral  0   Clinical Assessment    Psychosocial & Spiritual Assessment    Palliative Care Outcomes       Additional Data Reviewed:  CBC:    Component Value Date/Time   WBC 6.1 08/25/2015 0755   HGB 9.8* 08/25/2015 0755   HCT 30.6* 08/25/2015 0755   PLT 129* 08/25/2015 0755   MCV 83.4 08/25/2015 0755   NEUTROABS 4.5 08/24/2015 1715   LYMPHSABS 0.9 08/24/2015 1715   MONOABS 0.6 08/24/2015 1715   EOSABS 0.1 08/24/2015 1715   BASOSABS 0.0 08/24/2015 1715   Comprehensive Metabolic Panel:    Component Value Date/Time   NA 148* 08/25/2015 0755   K 5.1 08/25/2015 0755   CL 122* 08/25/2015 0755   CO2 16* 08/25/2015 0755   BUN 21* 08/25/2015 0755   CREATININE 1.58* 08/25/2015 0755   CREATININE 1.57* 08/29/2011 1553   GLUCOSE 75 08/25/2015 0755   CALCIUM 9.2 08/25/2015 0755   AST 12*  08/25/2015 0755   ALT 10* 08/25/2015 0755   ALKPHOS 102 08/25/2015 0755   BILITOT 0.6 08/25/2015 0755   PROT 5.3* 08/25/2015 0755   ALBUMIN 2.2* 08/25/2015 0755     Time In: 9:30 Time Out: 10:40 Time Total: 70 min Greater than 50%  of this time was spent counseling and coordinating care related to the above assessment and plan.  Signed by:  Algis Downs, PA-C Palliative Medicine Pager: [phone]   08/25/2015, 9:28 AM  Please contact Palliative Medicine Team phone at [phone] for questions and concerns.

## 2015-08-25 NOTE — Progress Notes (Signed)
PROGRESS NOTE  [name] ZOX:[id number] DOB: 03-19-1921 DOA: 08/24/2015 PCP: Michiel Sites, MD  HPI: 82 yo F with CAD with prior STEMI, A fib, HTN, DM, Patient has been confused per family not been acting herself. She resides at [facility] ever since her discharge on 26 of January. Family states she is sluggish to respond. She was seen by cardiology 4 days ago. Per cardiology note should her weight has gone up 12 pounds. Unknown she was seen by cardiology and an 9 of February was diagnosed with acute on chronic diastolic heart failure her Lasix has recently been increased to 40 mg daily from 20  Subjective / 24 H Interval events - unable to wake up, poorly responsive. Intermittently trying to open eyes. Breathing comfortably  Assessment/Plan: Active Problems:   DM (diabetes mellitus), type 2 with renal complications (HCC)   Ischemic cardiomyopathy, EF 45-50% 2D 03/13/12   CAD (coronary artery disease) of bypass graft, along with subtotal left main occlusion and and RCA occlusion - graft dependent   Acute renal failure superimposed on stage 3 chronic kidney disease (HCC)   Edema   Anemia due to poor nutrition   Sepsis (HCC)   Dehydration   SIRS (systemic inflammatory response syndrome) (HCC)   SIRS - cannot completely exclude infectious process, continue antibiotics. - hypothermic on admission, improved - cultures pending  Acute encephalopathy  - the setting of SIRS, CT head unremarkable - patient poorly responsive, tries to open eyes, obtain EEG - may need MRI if not improving with antibiotics alone. GOC discussions ongoing as to how aggressive to be, palliative following.  Acute on chronic combined systolic and diastolic CHF due to Ischemic cardiomyopathy, EF 45-50% 2D 03/13/12  - patient likely has decreased by mouth intake for the past few days, Lasix as been recently increased.  - hydrated overnight, off fluids now - resume po Lasix tomorrow. Convert to IV if  unable to take PO.  DM (diabetes mellitus), type 2 with renal complications (HCC) order sliding scale   CAD (coronary artery disease) of bypass graft, along with subtotal left main occlusion and and RCA occlusion   Anemia due to poor nutrition  - appears to be at baseline continue to monitor   Acute renal failure superimposed on stage 3 chronic kidney disease (HCC)  - at baseline this morning  Right arm edema  - will keep elevated cardiology felt that this is secondary to venous occlusion. Family would like to get an ultrasound confirmed will order discussed her family likely patient will not be a candidate for any significant intervention    Diet: Diet Heart Room service appropriate?: Yes; Fluid consistency:: Thin Fluids: none  DVT Prophylaxis: Lovenox  Code Status: DNR Family Communication: d/w daughter bedside  Disposition Plan: home when ready   Barriers to discharge: EEG, [place]  Consultants:  Palliative care  Procedures:  None    Antibiotics Vancomycin 2/14 >> Zosyn 2/14 >>   Studies  Ct Head Wo Contrast  08/24/2015  CLINICAL DATA:  82 year old female with acute encephalopathy EXAM: CT HEAD WITHOUT CONTRAST TECHNIQUE: Contiguous axial images were obtained from the base of the skull through the vertex without intravenous contrast. COMPARISON:  Prior head CT 07/30/2015 FINDINGS: Negative for acute intracranial hemorrhage, acute infarction, mass, mass effect, hydrocephalus or midline shift. Gray-white differentiation is preserved throughout. Relatively mild atrophy and chronic microvascular ischemic white matter change for age. No focal new scalp hematoma or evidence of calvarial injury. Atherosclerotic calcifications in both cavernous carotid  arteries. IMPRESSION: No acute intracranial abnormality. Electronically Signed   By: Malachy Moan M.D.   On: 08/24/2015 23:38   Dg Chest Portable 1 View  08/24/2015  CLINICAL DATA:  Altered mental status, extensive cardiac  history EXAM: PORTABLE CHEST 1 VIEW COMPARISON:  08/02/2015 FINDINGS: Cardiac enlargement right-sided pacemaker status post CABG all stable. Blunting right costophrenic angle stable. Mild bibasilar atelectasis. No evidence of pulmonary edema. Right shoulder arthropathy. IMPRESSION: Mild atelectasis with numerous chronic findings as described above Electronically Signed   By: Esperanza Heir M.D.   On: 08/24/2015 18:36    Objective  Filed Vitals:   08/24/15 2230 08/24/15 2245 08/24/15 2354 08/25/15 0524  BP: 111/51 118/49 122/62 121/50  Pulse: 76 74 73 62  Temp:   97.5 F (36.4 C) 97.3 F (36.3 C)  TempSrc:   Oral Oral  Resp: 18 15    SpO2: 97% 97% 96% 98%    Intake/Output Summary (Last 24 hours) at 08/25/15 1341 Last data filed at 08/25/15 0845  Gross per 24 hour  Intake      0 ml  Output   1000 ml  Net  -1000 ml   Exam:  GENERAL: no apparent distress, appears comfortable, however unable to wake up   HEENT: no scleral icterus, PERRL  NECK: supple, no LAD  LUNGS: CTA biL, no wheezing  HEART: RRR without MRG, 2+ pitting LE edema  ABDOMEN: soft, non tender  EXTREMITIES: no clubbing / cyanosis  NEUROLOGIC: not following commands   Data Reviewed: Basic Metabolic Panel:  Recent Labs Lab 08/24/15 1715 08/25/15 0755  NA 145 148*  K 5.0 5.1  CL 122* 122*  CO2 16* 16*  GLUCOSE 74 75  BUN 24* 21*  CREATININE 1.69* 1.58*  CALCIUM 9.2 9.2  MG  --  2.1  PHOS  --  2.6   Liver Function Tests:  Recent Labs Lab 08/24/15 1715 08/25/15 0755  AST 14* 12*  ALT 10* 10*  ALKPHOS 108 102  BILITOT 0.4 0.6  PROT 5.1* 5.3*  ALBUMIN 2.4* 2.2*   No results for input(s): LIPASE, AMYLASE in the last 168 hours. No results for input(s): AMMONIA in the last 168 hours. CBC:  Recent Labs Lab 08/24/15 1715 08/25/15 0755  WBC 6.1 6.1  NEUTROABS 4.5  --   HGB 9.9* 9.8*  HCT 31.6* 30.6*  MCV 81.9 83.4  PLT 108* 129*   Cardiac Enzymes: No results for input(s): CKTOTAL,  CKMB, CKMBINDEX, TROPONINI in the last 168 hours. BNP (last 3 results)  Recent Labs  03/01/15 1849 06/24/15 1908 08/24/15 1715  BNP 224.5* 841.3* 869.5*    ProBNP (last 3 results) No results for input(s): PROBNP in the last 8760 hours.  CBG:  Recent Labs Lab 08/24/15 1709 08/24/15 2356 08/25/15 0046 08/25/15 0529 08/25/15 1209  GLUCAP 68 60* 148* 88 76    Recent Results (from the past 240 hour(s))  Urine culture     Status: None (Preliminary result)   Collection Time: 08/24/15  6:51 PM  Result Value Ref Range Status   Specimen Description URINE, CATHETERIZED  Final   Special Requests NONE  Final   Culture NO GROWTH < 24 HOURS  Final   Report Status PENDING  Incomplete  MRSA PCR Screening     Status: None   Collection Time: 08/25/15  8:06 AM  Result Value Ref Range Status   MRSA by PCR NEGATIVE NEGATIVE Final    Comment:        The GeneXpert  MRSA Assay (FDA approved for NASAL specimens only), is one component of a comprehensive MRSA colonization surveillance program. It is not intended to diagnose MRSA infection nor to guide or monitor treatment for MRSA infections.      Scheduled Meds: . allopurinol  100 mg Oral Daily  . aspirin  81 mg Oral Daily  . carvedilol  6.25 mg Oral BID WC  . enoxaparin (LOVENOX) injection  30 mg Subcutaneous Q24H  . insulin aspart  0-9 Units Subcutaneous 6 times per day  . levothyroxine  100 mcg Oral QAC breakfast  . megestrol  400 mg Oral Daily  . pantoprazole  40 mg Oral Daily  . piperacillin-tazobactam (ZOSYN)  IV  2.25 g Intravenous Q8H  . polyvinyl alcohol  1 drop Both Eyes TID  . [START ON 08/26/2015] vancomycin  1,000 mg Intravenous Q48H   Continuous Infusions:    Pamella Pert, MD [facility] Pager [phone]. If 7 PM - 7 AM, please contact night-coverage at [email or web address], password [facility] 08/25/2015, 1:41 PM  LOS: 1 day

## 2015-08-25 NOTE — Care Management Obs Status (Signed)
MEDICARE OBSERVATION STATUS NOTIFICATION   Patient Details  Name: [name] MRN: [id number] Date of Birth: 02-06-1921   Medicare Observation Status Notification Given:  Yes signed by daughter    Lawerance Sabal, RN 08/25/2015, 3:16 PM

## 2015-08-25 NOTE — Procedures (Signed)
ELECTROENCEPHALOGRAM REPORT  Patient: DEKEISHA CASABLANCA       Room #: 3O75 EEG No. ID: [id number] Age: 82 y.o.        Sex: female Referring Physician: Wendy Poet Report Date:  08/25/2015        Interpreting Physician: Aline Brochure  History: MARVALEE MCBROOM is an 82 y.o. female with multiple chronic medical problems admitted with acute encephalopathy with associated SIRS, dehydration and acute on chronic renal failure.  Indications for study:  Assess severity of encephalopathy; rule out seizure activity.  Technique: This is an 18 channel routine scalp EEG performed at the bedside with bipolar and monopolar montages arranged in accordance to the international 10/20 system of electrode placement.   Description: This EEG recording was performed during wakefulness. Predominant background activity consisted of low amplitude diffuse 1-2 Hz delta activity with superimposed diffuse theta activity which was most prominent and frequently rhythmic in the posterior head region. Photic stimulation was not performed. No epileptiform discharges were recorded.  Interpretation: This EEG showed mild generalized continuous nonspecific generalized slowing, which can be seen with metabolic as well as toxic and degenerative CNS disorders. No evidence of epileptic activity was recorded.   Venetia Maxon M.D. [facility] [phone]

## 2015-08-26 ENCOUNTER — Ambulatory Visit (HOSPITAL_COMMUNITY): Payer: Medicare Other

## 2015-08-26 DIAGNOSIS — I5043 Acute on chronic combined systolic (congestive) and diastolic (congestive) heart failure: Secondary | ICD-10-CM | POA: Diagnosis not present

## 2015-08-26 DIAGNOSIS — Z7982 Long term (current) use of aspirin: Secondary | ICD-10-CM | POA: Diagnosis not present

## 2015-08-26 DIAGNOSIS — R339 Retention of urine, unspecified: Secondary | ICD-10-CM | POA: Diagnosis not present

## 2015-08-26 DIAGNOSIS — E870 Hyperosmolality and hypernatremia: Secondary | ICD-10-CM | POA: Diagnosis not present

## 2015-08-26 DIAGNOSIS — A419 Sepsis, unspecified organism: Secondary | ICD-10-CM | POA: Diagnosis present

## 2015-08-26 DIAGNOSIS — Z515 Encounter for palliative care: Secondary | ICD-10-CM | POA: Diagnosis not present

## 2015-08-26 DIAGNOSIS — K219 Gastro-esophageal reflux disease without esophagitis: Secondary | ICD-10-CM | POA: Diagnosis present

## 2015-08-26 DIAGNOSIS — I5042 Chronic combined systolic (congestive) and diastolic (congestive) heart failure: Secondary | ICD-10-CM

## 2015-08-26 DIAGNOSIS — M7989 Other specified soft tissue disorders: Secondary | ICD-10-CM | POA: Insufficient documentation

## 2015-08-26 DIAGNOSIS — E1121 Type 2 diabetes mellitus with diabetic nephropathy: Secondary | ICD-10-CM | POA: Diagnosis not present

## 2015-08-26 DIAGNOSIS — Z9189 Other specified personal risk factors, not elsewhere classified: Secondary | ICD-10-CM

## 2015-08-26 DIAGNOSIS — T68XXXA Hypothermia, initial encounter: Secondary | ICD-10-CM

## 2015-08-26 DIAGNOSIS — Z66 Do not resuscitate: Secondary | ICD-10-CM | POA: Diagnosis present

## 2015-08-26 DIAGNOSIS — E11649 Type 2 diabetes mellitus with hypoglycemia without coma: Secondary | ICD-10-CM | POA: Diagnosis present

## 2015-08-26 DIAGNOSIS — I2510 Atherosclerotic heart disease of native coronary artery without angina pectoris: Secondary | ICD-10-CM | POA: Diagnosis present

## 2015-08-26 DIAGNOSIS — E860 Dehydration: Secondary | ICD-10-CM | POA: Diagnosis present

## 2015-08-26 DIAGNOSIS — E1122 Type 2 diabetes mellitus with diabetic chronic kidney disease: Secondary | ICD-10-CM | POA: Diagnosis present

## 2015-08-26 DIAGNOSIS — T68XXXD Hypothermia, subsequent encounter: Secondary | ICD-10-CM | POA: Diagnosis not present

## 2015-08-26 DIAGNOSIS — J690 Pneumonitis due to inhalation of food and vomit: Secondary | ICD-10-CM | POA: Diagnosis present

## 2015-08-26 DIAGNOSIS — M109 Gout, unspecified: Secondary | ICD-10-CM | POA: Diagnosis present

## 2015-08-26 DIAGNOSIS — Z955 Presence of coronary angioplasty implant and graft: Secondary | ICD-10-CM | POA: Diagnosis not present

## 2015-08-26 DIAGNOSIS — N179 Acute kidney failure, unspecified: Secondary | ICD-10-CM | POA: Diagnosis present

## 2015-08-26 DIAGNOSIS — I252 Old myocardial infarction: Secondary | ICD-10-CM | POA: Diagnosis not present

## 2015-08-26 DIAGNOSIS — Z950 Presence of cardiac pacemaker: Secondary | ICD-10-CM | POA: Diagnosis not present

## 2015-08-26 DIAGNOSIS — E118 Type 2 diabetes mellitus with unspecified complications: Secondary | ICD-10-CM | POA: Diagnosis not present

## 2015-08-26 DIAGNOSIS — I130 Hypertensive heart and chronic kidney disease with heart failure and stage 1 through stage 4 chronic kidney disease, or unspecified chronic kidney disease: Secondary | ICD-10-CM | POA: Diagnosis present

## 2015-08-26 DIAGNOSIS — F0390 Unspecified dementia without behavioral disturbance: Secondary | ICD-10-CM | POA: Diagnosis present

## 2015-08-26 DIAGNOSIS — I25810 Atherosclerosis of coronary artery bypass graft(s) without angina pectoris: Secondary | ICD-10-CM | POA: Diagnosis present

## 2015-08-26 DIAGNOSIS — E039 Hypothyroidism, unspecified: Secondary | ICD-10-CM | POA: Diagnosis present

## 2015-08-26 DIAGNOSIS — I255 Ischemic cardiomyopathy: Secondary | ICD-10-CM | POA: Diagnosis not present

## 2015-08-26 DIAGNOSIS — N184 Chronic kidney disease, stage 4 (severe): Secondary | ICD-10-CM | POA: Diagnosis present

## 2015-08-26 DIAGNOSIS — R680 Hypothermia, not associated with low environmental temperature: Secondary | ICD-10-CM | POA: Diagnosis present

## 2015-08-26 DIAGNOSIS — I509 Heart failure, unspecified: Secondary | ICD-10-CM | POA: Diagnosis not present

## 2015-08-26 DIAGNOSIS — R1310 Dysphagia, unspecified: Secondary | ICD-10-CM | POA: Insufficient documentation

## 2015-08-26 DIAGNOSIS — T502X5A Adverse effect of carbonic-anhydrase inhibitors, benzothiadiazides and other diuretics, initial encounter: Secondary | ICD-10-CM | POA: Diagnosis not present

## 2015-08-26 DIAGNOSIS — Z79818 Long term (current) use of other agents affecting estrogen receptors and estrogen levels: Secondary | ICD-10-CM | POA: Diagnosis not present

## 2015-08-26 DIAGNOSIS — G9340 Encephalopathy, unspecified: Secondary | ICD-10-CM | POA: Diagnosis not present

## 2015-08-26 DIAGNOSIS — F419 Anxiety disorder, unspecified: Secondary | ICD-10-CM | POA: Diagnosis present

## 2015-08-26 DIAGNOSIS — R4182 Altered mental status, unspecified: Secondary | ICD-10-CM | POA: Diagnosis present

## 2015-08-26 DIAGNOSIS — M1990 Unspecified osteoarthritis, unspecified site: Secondary | ICD-10-CM | POA: Diagnosis present

## 2015-08-26 LAB — BASIC METABOLIC PANEL
Anion gap: 5 (ref 5–15)
BUN: 19 mg/dL (ref 6–20)
CHLORIDE: 123 mmol/L — AB (ref 101–111)
CO2: 17 mmol/L — ABNORMAL LOW (ref 22–32)
CREATININE: 1.65 mg/dL — AB (ref 0.44–1.00)
Calcium: 9 mg/dL (ref 8.9–10.3)
GFR calc Af Amer: 30 mL/min — ABNORMAL LOW (ref >60)
GFR calc non Af Amer: 26 mL/min — ABNORMAL LOW (ref >60)
GLUCOSE: 91 mg/dL (ref 65–99)
POTASSIUM: 5.2 mmol/L — AB (ref 3.5–5.1)
SODIUM: 145 mmol/L (ref 135–145)

## 2015-08-26 LAB — CBC
HCT: 30 % — ABNORMAL LOW (ref 36.0–46.0)
HEMOGLOBIN: 9.6 g/dL — AB (ref 12.0–15.0)
MCH: 26.6 pg (ref 26.0–34.0)
MCHC: 32 g/dL (ref 30.0–36.0)
MCV: 83.1 fL (ref 78.0–100.0)
Platelets: 137 10*3/uL — ABNORMAL LOW (ref 150–400)
RBC: 3.61 MIL/uL — AB (ref 3.87–5.11)
RDW: 24.8 % — ABNORMAL HIGH (ref 11.5–15.5)
WBC: 4.4 10*3/uL (ref 4.0–10.5)

## 2015-08-26 LAB — HEMOGLOBIN A1C
Hgb A1c MFr Bld: 5.6 % (ref 4.8–5.6)
MEAN PLASMA GLUCOSE: 114 mg/dL

## 2015-08-26 LAB — URINE CULTURE: CULTURE: NO GROWTH

## 2015-08-26 LAB — GLUCOSE, CAPILLARY
GLUCOSE-CAPILLARY: 86 mg/dL (ref 65–99)
GLUCOSE-CAPILLARY: 124 mg/dL — AB (ref 65–99)
Glucose-Capillary: 96 mg/dL (ref 65–99)
Glucose-Capillary: 118 mg/dL — ABNORMAL HIGH (ref 65–99)

## 2015-08-26 MED ORDER — FUROSEMIDE 10 MG/ML IJ SOLN
40.0000 mg | Freq: Every day | INTRAMUSCULAR | Status: DC
  Administered 2015-08-26 – 2015-08-28 (×3): 40 mg via INTRAVENOUS
  Filled 2015-08-26 (×3): qty 4

## 2015-08-26 NOTE — Progress Notes (Signed)
VASCULAR LAB PRELIMINARY  PRELIMINARY  PRELIMINARY  PRELIMINARY  Bilateral upper extremity venous duplex completed.    Preliminary report:  Bilateral :  No evidence of DVT or superficial thrombosis.    Marie Borowski, RVS 08/26/2015, 5:16 PM

## 2015-08-26 NOTE — Progress Notes (Signed)
SLP Cancellation Note  Patient Details Name: [name] MRN: [id number] DOB: 1920-11-13   Cancelled treatment:       Reason Eval/Treat Not Completed: Patient was out of room at a procedure.  Will f/u for swallow eval next date.    Blenda Mounts Laurice 08/26/2015, 4:47 PM

## 2015-08-26 NOTE — Progress Notes (Signed)
Daily Progress Note   Patient Name: [name]       Date: 08/26/2015 DOB: 01/08/1921  Age: 82 y.o. MRN#: [id number] Attending Physician: Catarina Hartshorn, MD Primary Care Physician: Michiel Sites, MD Admit Date: 08/24/2015  Reason for Consultation/Follow-up: Establishing goals of care and Hospice Evaluation  Subjective: I have a hard time swallowing - its food and also sometimes water.    Per family patient has been having some difficulty swallowing for the last couple of months.  The patient denies odynaphagia.  She has not had this evaluated by an MD.  Daughter requests a swallow evaluation.  Interval Events:  Patient appears much improved.  Awake, alert and answering questions appropriately.  Was SOB earlier today.  Dr. Arbutus Leas spoke with me and is increasing her furosemide dose in order to treat the SOB.  Family understands.  Family planning on making arrangements for 24 hour care in the patient's home along with hospice services.   I will plan on filling out a MOST form with the patients daughter, Barrett Henle, on 2/16.    Length of Stay: 2 days  Current Medications: Scheduled Meds:  . allopurinol  100 mg Oral Daily  . aspirin  81 mg Oral Daily  . carvedilol  6.25 mg Oral BID WC  . enoxaparin (LOVENOX) injection  30 mg Subcutaneous Q24H  . furosemide  40 mg Intravenous Daily  . levothyroxine  100 mcg Oral QAC breakfast  . megestrol  400 mg Oral Daily  . pantoprazole  40 mg Oral Daily  . piperacillin-tazobactam (ZOSYN)  IV  2.25 g Intravenous Q8H  . polyvinyl alcohol  1 drop Both Eyes TID    Continuous Infusions:    PRN Meds: acetaminophen **OR** acetaminophen, albuterol, HYDROcodone-acetaminophen, ondansetron **OR** ondansetron (ZOFRAN) IV, polyethylene  glycol   Physical Exam            Awake, alert, pleasant bedbound elderly female in NAD. CV RRR Resp:  No increased work of breathing Upper Extremities:  Swollen bilaterally (right > left)  Vital Signs: BP 136/73 mmHg  Pulse 70  Temp(Src) 98 F (36.7 C) (Oral)  Resp 16  SpO2 97% SpO2: SpO2: 97 % O2 Device: O2 Device: Not Delivered O2 Flow Rate:    Intake/output summary:  Intake/Output Summary (Last 24 hours) at 08/26/15 1601 Last  data filed at 08/26/15 1436  Gross per 24 hour  Intake    480 ml  Output   1750 ml  Net  -1270 ml   LBM: Last BM Date: 08/26/15 Baseline Weight:   Most recent weight:         Palliative Assessment/Data: Flowsheet Rows        Most Recent Value   Intake Tab    Referral Department  Hospitalist   Unit at Time of Referral  Other (Comment)   Palliative Care Primary Diagnosis  Sepsis/Infectious Disease   Date Notified  08/24/15   Palliative Care Type  New Palliative care   Reason for referral  Clarify Goals of Care   Date of Admission  08/24/15   Date first seen by Palliative Care  08/25/15   # of days Palliative referral response time  1 Day(s)   # of days IP prior to Palliative referral  0   Clinical Assessment    Palliative Performance Scale Score  10%   Psychosocial & Spiritual Assessment    Palliative Care Outcomes    Patient/Family meeting held?  Yes   Who was at the meeting?  Daughter Barrett Henle   Palliative Care Outcomes  Clarified goals of care, Counseled regarding hospice      Additional Data Reviewed: CBC    Component Value Date/Time   WBC 4.4 08/26/2015 0751   RBC 3.61* 08/26/2015 0751   HGB 9.6* 08/26/2015 0751   HCT 30.0* 08/26/2015 0751   PLT 137* 08/26/2015 0751   MCV 83.1 08/26/2015 0751   MCH 26.6 08/26/2015 0751   MCHC 32.0 08/26/2015 0751   RDW 24.8* 08/26/2015 0751   LYMPHSABS 0.9 08/24/2015 1715   MONOABS 0.6 08/24/2015 1715   EOSABS 0.1 08/24/2015 1715   BASOSABS 0.0 08/24/2015 1715    CMP      Component Value Date/Time   NA 145 08/26/2015 0751   K 5.2* 08/26/2015 0751   CL 123* 08/26/2015 0751   CO2 17* 08/26/2015 0751   GLUCOSE 91 08/26/2015 0751   BUN 19 08/26/2015 0751   CREATININE 1.65* 08/26/2015 0751   CREATININE 1.57* 08/29/2011 1553   CALCIUM 9.0 08/26/2015 0751   PROT 5.3* 08/25/2015 0755   ALBUMIN 2.2* 08/25/2015 0755   AST 12* 08/25/2015 0755   ALT 10* 08/25/2015 0755   ALKPHOS 102 08/25/2015 0755   BILITOT 0.6 08/25/2015 0755   GFRNONAA 26* 08/26/2015 0751   GFRAA 30* 08/26/2015 0751       Problem List:  Patient Active Problem List   Diagnosis Date Noted  . Acute on chronic combined systolic and diastolic CHF (congestive heart failure) (HCC) 08/26/2015  . Arm swelling   . Hypothermia   . Acute encephalopathy   . Palliative care encounter   . Encounter for hospice care discussion   . Sepsis (HCC) 08/24/2015  . Dehydration 08/24/2015  . SIRS (systemic inflammatory response syndrome) (HCC) 08/24/2015  . Occlusion of right subclavian vein (HCC) 08/21/2015  . Anemia due to poor nutrition 08/03/2015  . 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 Depressed 08/03/2015  . Cardiac pacemaker in situ 08/03/2015  . Hypernatremia 08/03/2015  . Altered mental status 07/30/2015  . Elevated lactic acid level 07/30/2015  . Delirium 07/30/2015  . Edema   . Acute on chronic diastolic CHF (congestive heart failure) (HCC) 06/24/2015  . CHF exacerbation (HCC) 06/24/2015  . Abscess of chest wall 04/26/2015  . GERD (gastroesophageal reflux disease) 04/26/2015  . Hyperkalemia 04/19/2015  .  Acute renal failure superimposed on stage 3 chronic kidney disease (HCC) 04/19/2015  . Pacemaker infection (HCC) 04/14/2015  . AV block, 3rd degree (HCC)   . Bradycardia   . Symptomatic bradycardia 02/23/2015  . CAD (coronary artery disease) of bypass graft, along with subtotal left main occlusion and and RCA occlusion - graft dependent 08/03/2013  . Dermatophytosis of nail   .  Obesity (BMI 30-39.9) 02/15/2013  . Ischemic cardiomyopathy, EF 45-50% 2D 03/13/12 03/15/2012  . STEMI, 03/10/12 Rx'd with PCI with recurrance 03/11/12 - redo PCI of SVG-RCA 03/13/2012  . S/P CABG x 3, LIMA - LAD, SVG-OM, SVG- PDA 08/05/11 08/05/2011    Class: Hospitalized for  . DM (diabetes mellitus), type 2 with renal complications (HCC) 05/28/2011  . History of gout 05/28/2011  . Hypothyroidism 05/27/2011  . Inf STEMI #1 -PTCA to RCA (05/2011) + LM 80;NSTEMI (07/2011) - CABG x3;  inf STEMI #2 -- SVG-RCA  PCI with DES X 3 on 03/10/12; with re-occlusion 24hrs later (inferior STEMI #3) - redo thrombectomy/PTCA 05/26/2011  . Hypertensive heart disease with CHF (congestive heart failure) (HCC) 05/26/2011  . Dyslipidemia with statin intolerance 05/26/2011     Palliative Care Assessment & Plan    1.Code Status:  DNR  2. Goals of Care/Additional Recommendations:  Continue full scope treatment with DNR code status.  Arrange for hospice care in the patient's home.  Social work order placed.  Anticipate D/C on Saturday.  Will attempt to complete a MOST form with Barrett Henle on 2/16.  Have requested swallow eval with MBS if appropriate  3. Symptom Management:      Per primary team.  5. Prognosis: less than 6 months due to frequent hospitalizations, being bed bound, recurrent infections, aspiration  6. Discharge Planning:  To home with hospice services on Saturday.   Care plan was discussed with MD this am, RN & Family and bedside  Thank you for allowing the Palliative Medicine Team to assist in the care of this patient.   Time In: 3:30 Time Out: 4:05 Total Time 35 min Prolonged Time Billed  no         Stephani Police, PA-C  08/26/2015, 4:01 PM  Please contact Palliative Medicine Team phone at [phone] for questions and concerns.

## 2015-08-26 NOTE — Progress Notes (Signed)
PROGRESS NOTE  [name] ZOX:[id number] DOB: 1921/03/14 DOA: 08/24/2015 PCP: Michiel Sites, MD Brief History 82 year old female with a history of coronary artery disease with history of STEMI, atrial fibrillation, ischemic cardiomyopathy, complete heart block status post PPM, CKD stage [name] presented with worsening confusion. The patient was recently discharged to [facility] on 08/06/2015 after a hospital admission for dehydration. Notably, the patient was seen by cardiology who noted the patient to have gained 12 pounds since her discharge date. Her furosemide was increased from 20 mg to 40 mg daily. Assessment/Plan: Acute encephalopathy -Multifactorial with suspected aspiration and fluid overload -More awake and alert although pleasantly confused on 08/26/2015 Acute on chronic systolic and diastolic CHF -Patient remains clinically fluid overloaded -Start intravenous furosemide -Daily weights -repeat Echo -08/06/2015 discharge weight 137 pounds SIRS -pt presented with hypothermia -Urinalysis negative for pyuria -Blood cultures remain negative -Concern about aspiration pneumonitis -Continue Zosyn -Discontinue vancomycin Pulmonary infiltrates -Concern about aspiration pneumonitis -Continue Zosyn Diabetes mellitus type 2 -The patient has been mildly hypoglycemic secondary to poor oral intake -08/25/2015 hemoglobin A1c--5.6 -Discontinue CBGs -Discontinue sliding scale insulin CKD stage [name]-4 -Baseline creatinine 1.3-1.6 -Monitor with diuresis CAD (coronary artery disease)  -hx of bypass graft, along with subtotal left main occlusion and and RCA occlusion  -no angina presently RUE edema -check duplex     Family Communication:  No family at bedside Disposition Plan:   Home with hospice 2-3 days     Procedures/Studies: Ct Head Wo Contrast  08/24/2015  CLINICAL DATA:  82 year old female with acute encephalopathy EXAM: CT HEAD WITHOUT CONTRAST  TECHNIQUE: Contiguous axial images were obtained from the base of the skull through the vertex without intravenous contrast. COMPARISON:  Prior head CT 07/30/2015 FINDINGS: Negative for acute intracranial hemorrhage, acute infarction, mass, mass effect, hydrocephalus or midline shift. Gray-white differentiation is preserved throughout. Relatively mild atrophy and chronic microvascular ischemic white matter change for age. No focal new scalp hematoma or evidence of calvarial injury. Atherosclerotic calcifications in both cavernous carotid arteries. IMPRESSION: No acute intracranial abnormality. Electronically Signed   By: Malachy Moan M.D.   On: 08/24/2015 23:38   Ct Head Wo Contrast  07/30/2015  CLINICAL DATA:  Altered mental status, lower abdominal pain, with anorexia. Head trauma. History of hypertension, hyperlipidemia, diabetes, chronic kidney disease. EXAM: CT HEAD WITHOUT CONTRAST TECHNIQUE: Contiguous axial images were obtained from the base of the skull through the vertex without intravenous contrast. COMPARISON:  CT head March 01, 2015 FINDINGS: Mildly motion degraded examination. The ventricles and sulci are normal for age. No intraparenchymal hemorrhage, mass effect nor midline shift. Patchy supratentorial white matter hypodensities are less than expected for patient's age and though non-specific suggest sequelae of chronic small vessel ischemic disease. No acute large vascular territory infarcts. No abnormal extra-axial fluid collections. Basal cisterns are patent. Moderate calcific atherosclerosis of the carotid siphons. No skull fracture. The included ocular globes and orbital contents are non-suspicious. Soft tissue partially opacifies the included RIGHT maxillary sinus. The mastoid air cells are well aerated. Severe LEFT temporomandibular osteoarthrosis. IMPRESSION: Negative CT head for age. Electronically Signed   By: Awilda Metro M.D.   On: 07/30/2015 03:04   Dg Chest Portable 1  View  08/24/2015  CLINICAL DATA:  Altered mental status, extensive cardiac history EXAM: PORTABLE CHEST 1 VIEW COMPARISON:  08/02/2015 FINDINGS: Cardiac enlargement right-sided pacemaker status post CABG all stable. Blunting right costophrenic angle stable. Mild bibasilar atelectasis. No evidence of  pulmonary edema. Right shoulder arthropathy. IMPRESSION: Mild atelectasis with numerous chronic findings as described above Electronically Signed   By: Esperanza Heir M.D.   On: 08/24/2015 18:36   Dg Chest Port 1 View  08/02/2015  CLINICAL DATA:  Worsening dementia and delirium. EXAM: PORTABLE CHEST 1 VIEW COMPARISON:  07/29/2015 FINDINGS: Dual lead cardiac pacemaker is unchanged. The cardiac silhouette is stably enlarged. Mediastinal contours appear intact. The aorta is torturous and contains atherosclerotic calcifications. There is no evidence of focal airspace consolidation, pleural effusion or pneumothorax. Osseous structures are without acute abnormality. Soft tissues are grossly normal. IMPRESSION: Stably enlarged cardiac silhouette. No evidence of focal airspace consolidation. Electronically Signed   By: Ted Mcalpine M.D.   On: 08/02/2015 15:52   Dg Chest Port 1 View  07/30/2015  CLINICAL DATA:  82 year old female with altered mental status EXAM: PORTABLE CHEST 1 VIEW COMPARISON:  Radiograph dated 06/24/2015 FINDINGS: Single-view of the chest demonstrates mild central vascular prominence likely a degree of congestive changes. No focal consolidation, pleural effusion, or pneumothorax. Stable cardiomegaly. Right pectoral pacemaker device. No acute osseous pathology. IMPRESSION: Stable cardiomegaly with mild congestive changes. No focal consolidation. Electronically Signed   By: Elgie Collard M.D.   On: 07/30/2015 00:24         Subjective:.  patient is confused but alert and conversant. Denies any fevers, chest, chest pain, abdominal pain. Complains of shortness of breath. No diarrhea,  hemoptysis, vomiting, headache Objective: Filed Vitals:   08/25/15 2026 08/25/15 2350 08/26/15 0659 08/26/15 1037  BP: 189/87 121/73 169/89   Pulse: 79  84   Temp:      TempSrc:      Resp: 20 22 20    SpO2: 96%  98% 96%    Intake/Output Summary (Last 24 hours) at 08/26/15 1044 Last data filed at 08/26/15 1036  Gross per 24 hour  Intake     50 ml  Output   1315 ml  Net  -1265 ml   Weight change:  Exam:   General:  Pt is alert, follows commands appropriately, not in acute distress  HEENT: No icterus, No thrush, No neck mass, /AT  Cardiovascular: RRR, S1/S2, no rubs, no gallops  Respiratory: Bilateral crackles. No wheeze  Abdomen: Soft/+BS, non tender, non distended, no guarding; no hepatosplenomegaly  Extremities: 2+LE edema, No lymphangitis, No petechiae, No rashes, no synovitis; no clubbing or cyanosis  Data Reviewed: Basic Metabolic Panel:  Recent Labs Lab 08/24/15 1715 08/25/15 0755 08/26/15 0751  NA 145 148* 145  K 5.0 5.1 5.2*  CL 122* 122* 123*  CO2 16* 16* 17*  GLUCOSE 74 75 91  BUN 24* 21* 19  CREATININE 1.69* 1.58* 1.65*  CALCIUM 9.2 9.2 9.0  MG  --  2.1  --   PHOS  --  2.6  --    Liver Function Tests:  Recent Labs Lab 08/24/15 1715 08/25/15 0755  AST 14* 12*  ALT 10* 10*  ALKPHOS 108 102  BILITOT 0.4 0.6  PROT 5.1* 5.3*  ALBUMIN 2.4* 2.2*   No results for input(s): LIPASE, AMYLASE in the last 168 hours. No results for input(s): AMMONIA in the last 168 hours. CBC:  Recent Labs Lab 08/24/15 1715 08/25/15 0755 08/26/15 0751  WBC 6.1 6.1 4.4  NEUTROABS 4.5  --   --   HGB 9.9* 9.8* 9.6*  HCT 31.6* 30.6* 30.0*  MCV 81.9 83.4 83.1  PLT 108* 129* 137*   Cardiac Enzymes: No results for input(s): CKTOTAL, CKMB, CKMBINDEX,  TROPONINI in the last 168 hours. BNP: Invalid input(s): POCBNP CBG:  Recent Labs Lab 08/25/15 0046 08/25/15 0529 08/25/15 1209 08/25/15 1610 08/26/15 0802  GLUCAP 148* 88 76 66 86    Recent Results  (from the past 240 hour(s))  Blood culture (routine x 2)     Status: None (Preliminary result)   Collection Time: 08/24/15  5:15 PM  Result Value Ref Range Status   Specimen Description BLOOD LEFT FOREARM  Final   Special Requests BOTTLES DRAWN AEROBIC AND ANAEROBIC 5CC  Final   Culture NO GROWTH < 24 HOURS  Final   Report Status PENDING  Incomplete  Urine culture     Status: None (Preliminary result)   Collection Time: 08/24/15  6:51 PM  Result Value Ref Range Status   Specimen Description URINE, CATHETERIZED  Final   Special Requests NONE  Final   Culture NO GROWTH < 24 HOURS  Final   Report Status PENDING  Incomplete  Blood culture (routine x 2)     Status: None (Preliminary result)   Collection Time: 08/24/15  7:12 PM  Result Value Ref Range Status   Specimen Description BLOOD RIGHT FOREARM  Final   Special Requests BOTTLES DRAWN AEROBIC AND ANAEROBIC 5CC  Final   Culture NO GROWTH < 24 HOURS  Final   Report Status PENDING  Incomplete  MRSA PCR Screening     Status: None   Collection Time: 08/25/15  8:06 AM  Result Value Ref Range Status   MRSA by PCR NEGATIVE NEGATIVE Final    Comment:        The GeneXpert MRSA Assay (FDA approved for NASAL specimens only), is one component of a comprehensive MRSA colonization surveillance program. It is not intended to diagnose MRSA infection nor to guide or monitor treatment for MRSA infections.      Scheduled Meds: . allopurinol  100 mg Oral Daily  . aspirin  81 mg Oral Daily  . carvedilol  6.25 mg Oral BID WC  . enoxaparin (LOVENOX) injection  30 mg Subcutaneous Q24H  . furosemide  40 mg Oral Daily  . insulin aspart  0-9 Units Subcutaneous 6 times per day  . levothyroxine  100 mcg Oral QAC breakfast  . megestrol  400 mg Oral Daily  . pantoprazole  40 mg Oral Daily  . piperacillin-tazobactam (ZOSYN)  IV  2.25 g Intravenous Q8H  . polyvinyl alcohol  1 drop Both Eyes TID  . vancomycin  1,000 mg Intravenous Q48H   Continuous  Infusions:    Kennice Finnie, DO  [facility] Pager [phone]  If 7PM-7AM, please contact night-coverage [email or web address] Password [email or web address] 08/26/2015, 10:44 AM   LOS: 2 days

## 2015-08-27 ENCOUNTER — Ambulatory Visit (HOSPITAL_COMMUNITY): Payer: Medicare Other

## 2015-08-27 ENCOUNTER — Telehealth: Payer: Self-pay | Admitting: Cardiology

## 2015-08-27 DIAGNOSIS — I509 Heart failure, unspecified: Secondary | ICD-10-CM

## 2015-08-27 DIAGNOSIS — T68XXXD Hypothermia, subsequent encounter: Secondary | ICD-10-CM

## 2015-08-27 LAB — BASIC METABOLIC PANEL
Anion gap: 8 (ref 5–15)
BUN: 21 mg/dL — ABNORMAL HIGH (ref 6–20)
CALCIUM: 9.1 mg/dL (ref 8.9–10.3)
CO2: 18 mmol/L — ABNORMAL LOW (ref 22–32)
Chloride: 122 mmol/L — ABNORMAL HIGH (ref 101–111)
Creatinine, Ser: 1.69 mg/dL — ABNORMAL HIGH (ref 0.44–1.00)
GFR calc Af Amer: 29 mL/min — ABNORMAL LOW (ref >60)
GFR, EST NON AFRICAN AMERICAN: 25 mL/min — AB (ref >60)
GLUCOSE: 87 mg/dL (ref 65–99)
POTASSIUM: 4.6 mmol/L (ref 3.5–5.1)
Sodium: 148 mmol/L — ABNORMAL HIGH (ref 135–145)

## 2015-08-27 LAB — GLUCOSE, CAPILLARY
GLUCOSE-CAPILLARY: 102 mg/dL — AB (ref 65–99)
GLUCOSE-CAPILLARY: 107 mg/dL — AB (ref 65–99)
GLUCOSE-CAPILLARY: 75 mg/dL (ref 65–99)
GLUCOSE-CAPILLARY: 79 mg/dL (ref 65–99)
GLUCOSE-CAPILLARY: 88 mg/dL (ref 65–99)
Glucose-Capillary: 129 mg/dL — ABNORMAL HIGH (ref 65–99)

## 2015-08-27 NOTE — Consult Note (Signed)
Pharmacy Antibiotic Note  [name] is a 82 y.o. female admitted on 08/24/2015 with sepsis.  Pharmacy has been consulted for zosyn dosing for possible aspiration pneumonitis - Day #4. Cultures neg to date. Afeb, wbc wnl. SCr stable 1.69, CrCl~18.  Plan: Zosyn 2.25 g IV q8h - may need to adjust up if renal function improving F/u renal function, cultures, LOT, clinical progression  Weight: 144 lb 8 oz (65.545 kg)  Temp (24hrs), Avg:98.1 F (36.7 C), Min:98 F (36.7 C), Max:98.1 F (36.7 C)   Recent Labs Lab 08/24/15 1715 08/24/15 1728 08/24/15 2135 08/25/15 0755 08/26/15 0751 08/27/15 0710  WBC 6.1  --   --  6.1 4.4  --   CREATININE 1.69*  --   --  1.58* 1.65* 1.69*  LATICACIDVEN  --  0.58 0.51  --   --   --     Estimated Creatinine Clearance: 18.4 mL/min (by C-G formula based on Cr of 1.69).    Antimicrobials this admission: Vanc 2/13 >> 2/15 Zosyn 2/13 >>   Dose adjustments this admission: n/a  Microbiology results: 2/13 BCx2>>ngtd 2/13 UC>>neg 2/14 MRSA pcr: neg  [name], PharmD, [facility] Clinical Pharmacist Pager [phone] 08/27/2015 10:09 AM

## 2015-08-27 NOTE — Evaluation (Signed)
Clinical/Bedside Swallow Evaluation Patient Details  Name: [name] MRN: [id number] Date of Birth: [birth year]  Today's Date: 08/27/2015 Time: SLP Start Time (ACUTE ONLY): 1057 SLP Stop Time (ACUTE ONLY): 1106 SLP Time Calculation (min) (ACUTE ONLY): 9 min  Past Medical History:  Past Medical History  Diagnosis Date  . ST elevation myocardial infarction (STEMI) of inferior wall (HCC) 05/2011; 03/10/2012    a) 11/'12: 100% RCA, dLM 80% -- POBA of RCA (for planned CABG, not done until re-admission with NSTEMI 1/'13);; b) 8/'13: 100% SVG-RCA (PCI & re-PCI), native RCA 100%; Patent LIMA-p-mLAD, SVG-OM  . CAD S/P percutaneous coronary angioplasty 11/'12; 8/31 & 9/1/'13    a) MV: 100% RCA-POBA, dLM left main 80%; b) 1/'13: NSTEMI --> CABG; c) 2013: 8/31 - 100% RCA & acute SVG-RCA, 100% SVG-OM, 90% LM, 80% p&mLAD, ~70% RI, 50% Cx --> PCI-SVG-RCA: Promus Element DES x 3 (prox 2.5 mm x 38 mm & 2.5 mm x 16 mm, distal 2.5 mm x 16 mm); on 9/1 - accute in-stent Thrombosis - Aspiration thrombectomy & PTCA  . S/P CABG x 4 08/05/11    Dr. Tyrone Sage: LIMA-p-mLAD, SVG-RPDA, SVG-OM; known  100% SVG-OM, Extensive PTCA of SVG-RCA;;  Hospital course complicated by Afib & PNA; after d/c cellulitis of SVG harvest site due to edema  . Episodic atrial fibrillation (HCC) 07/2011    Post-Op CABG  . Ischemic cardiomyopathy 03/2012    Echo: EF 45-50% - Mild basal- mid inferolateral Hypokinesis: Gr 1 DD, mildly increased PAP.  Marland Kitchen Dyslipidemia, goal LDL below 70     statin intolerance (lipitor, crestor drug reaction); Welchol & Zetia  . Hypertension, essential   . DM (diabetes mellitus), type 2 with renal complications (HCC)   . Chronic kidney disease (CKD) stage G3b/A1, moderately decreased glomerular filtration rate (GFR) between 30-44 mL/min/1.73 square meter and albuminuria creatinine ratio less than 30 mg/g   . History of pneumonia     post-op from CABG  . Hypothyroidism     on synthroid  . Headache(784.0)   .  Osteoarthritis   . Anxiety     PRN Xanax   . Dermatophytosis of nail   . Gout     on Allopurinol  . Lower extremity edema     chronic  . GERD (gastroesophageal reflux disease) 04/26/2015  . Acute CHF (congestive heart failure) (HCC) 06/24/2015  . Delirium 07/30/2015  . Presence of permanent cardiac pacemaker   . Depressed 08/03/2015   Past Surgical History:  Past Surgical History  Procedure Laterality Date  . Cardiac catheterization  November 2012; August and September 2013    Patent LIMA-LAD, patent she had an OM, patent stented SVG-RCA; occluded native RCA, 90% ostial left main, tandem 80% proximal and mid LAD, 70% mid Circumflex  . Coronary angioplasty  November 2012    PTCA only of RCA and setting of inferior STEMI  . Coronary artery bypass graft  08/05/2011    Procedure: CORONARY ARTERY BYPASS GRAFTING (CABG);  Surgeon: [name], MD;  Location: [facility] OR;  Service: Open Heart Surgery;  Laterality: N/A;  coronary artery bypass graft times 4 using left internal mammary artery and right leg saphenous vein harvested endoscopically  . Joint replacement      both hips replaced  . I&d extremity  09/21/2011    Procedure: IRRIGATION AND DEBRIDEMENT EXTREMITY;  Surgeon: [name], MD;  Location: [facility] OR;  Service: Vascular;  Laterality: Right;  with wound vac placement  . Coronary angioplasty with stent  placement  03/10/2012     infferior STEMI: Occluded native RCA and SVG-RCA --> thrombectomy and 3 stent placement to the SVG-RCA (2.5 mm right 38 mm and 2 proximal distal overlapping 2.5 mm x 16 mm Promus DES stents:  . Coronary angioplasty  03/11/2012    Inferior STEMI #3: Reoccluded SVG-RCA; extensive thrombectomy and post dilation PTCA  . Transthoracic echocardiogram  September 2013    EF 45-50%, mild hypokinesis of the basal and mid inferolateral wall, grade 1 diastolic dysfunction, mildly increased artery pressures.  . Left heart catheterization with coronary angiogram N/A  05/26/2011    Procedure: LEFT HEART CATHETERIZATION WITH CORONARY ANGIOGRAM;  Surgeon: Marykay Lex, MD;  Location: [facility] CATH LAB;  Service: Cardiovascular;  Laterality: N/A;  . Percutaneous coronary intervention-balloon only N/A 05/26/2011    Procedure: PERCUTANEOUS CORONARY INTERVENTION-BALLOON ONLY;  Surgeon: Marykay Lex, MD;  Location: [facility] CATH LAB;  Service: Cardiovascular;  Laterality: N/A;  . Left heart cath N/A 03/10/2012    Procedure: LEFT HEART CATH;  Surgeon: Herby Abraham, MD;  Location: [facility] CATH LAB;  Service: Cardiovascular;  Laterality: N/A;  . Percutaneous coronary stent intervention (pci-s)  03/10/2012    Procedure: PERCUTANEOUS CORONARY STENT INTERVENTION (PCI-S);  Surgeon: Herby Abraham, MD;  Location: [facility] CATH LAB;  Service: Cardiovascular;;  . Left heart catheterization with coronary angiogram Bilateral 03/11/2012    Procedure: LEFT HEART CATHETERIZATION WITH CORONARY ANGIOGRAM;  Surgeon: Thurmon Fair, MD;  Location: [facility] CATH LAB;  Service: Cardiovascular;  Laterality: Bilateral;  . Ep implantable device N/A 02/24/2015    Procedure: Pacemaker Implant;  Surgeon: Thurmon Fair, MD;  Location: [facility] INVASIVE CV LAB;  Service: Cardiovascular;  Laterality: N/A;  . Ep implantable device N/A 04/17/2015    Procedure: Lead Extraction, Can Extraction;  Surgeon: Marinus Maw, MD;  Location: [facility] INVASIVE CV LAB;  Service: Cardiovascular;  Laterality: N/A;  . Cardiac catheterization N/A 04/17/2015    Procedure: Temporary Pacemaker;  Surgeon: Marinus Maw, MD;  Location: [facility] INVASIVE CV LAB;  Service: Cardiovascular;  Laterality: N/A;  . Ep implantable device N/A 04/21/2015    Procedure: Pacemaker Implant;  Surgeon: Marinus Maw, MD;  Location: [facility] INVASIVE CV LAB;  Service: Cardiovascular;  Laterality: N/A;  . Pacemaker lead removal  04/21/2015    Procedure: Pacemaker Lead Removal;  Surgeon: Marinus Maw, MD;  Location: [facility] INVASIVE CV LAB;  Service: Cardiovascular;;  LV    HPI:   82 year old female with complete heart block status post pacemaker placement, paroxysmal atrial fibrillation, diabetes mellitus, chronic kidney disease stage [name], mixed CHF, and hypothyroidism who has had 5 admissions in the past 6 months presents from SNF with altered mental status, hypothermia (93) and sepsis from unknown etiology.BSE completed 06/2015 with suspected primary esophageal component but recommend Dys 3 and thin liquids due to difficulty masticating meats. CXR 2/13 showed mild atelectasis/   Assessment / Plan / Recommendation Clinical Impression  Pt continues to show signs concerning for a primary esophageal component similar to previous evaluation, including globus sensation and frequent eructation particularly noted with thin liquids. Some immediate throat clearing is noted with straw sips of thin liquids, but it is alleviated with SLP removal of straw and Min cues for small sips. Pt politely declined soft solid trials this morning, stating she was too full; however, she did confirm that she still experiences some difficulty with mastication of harder textures, especially meats. Recommend Dys 3 diet and thin liquids by cup, with use of aspiration and  esophageal precautions.    Aspiration Risk  Mild aspiration risk    Diet Recommendation Dysphagia 3 (Mech soft);Thin liquid   Liquid Administration via: Cup;No straw Medication Administration: Whole meds with puree Supervision: Patient able to self feed;Staff to assist with self feeding;Full supervision/cueing for compensatory strategies Compensations: Slow rate;Small sips/bites;Follow solids with liquid Postural Changes: Seated upright at 90 degrees;Remain upright for at least 30 minutes after po intake    Other  Recommendations Oral Care Recommendations: Oral care BID   Follow up Recommendations  None    Frequency and Duration min 1 x/week  1 week       Prognosis Prognosis for Safe Diet Advancement: Fair Barriers to Reach  Goals: Other (Comment) (suspect primary esophageal component)      Swallow Study   General HPI: 82 year old female with complete heart block status post pacemaker placement, paroxysmal atrial fibrillation, diabetes mellitus, chronic kidney disease stage [name], mixed CHF, and hypothyroidism who has had 5 admissions in the past 6 months presents from SNF with altered mental status, hypothermia (93) and sepsis from unknown etiology.BSE completed 06/2015 with suspected primary esophageal component but recommend Dys 3 and thin liquids due to difficulty masticating meats. CXR 2/13 showed mild atelectasis/ Type of Study: Bedside Swallow Evaluation Previous Swallow Assessment: see HPI Diet Prior to this Study: Regular;Thin liquids Temperature Spikes Noted: No Respiratory Status: Room air History of Recent Intubation: No Behavior/Cognition: Alert;Cooperative;Pleasant mood Oral Cavity Assessment: Within Functional Limits Oral Care Completed by SLP: No Oral Cavity - Dentition: Adequate natural dentition Vision: Impaired for self-feeding Self-Feeding Abilities: Able to feed self;Needs assist Patient Positioning: Upright in bed Baseline Vocal Quality: Normal    Oral/Motor/Sensory Function Overall Oral Motor/Sensory Function: Within functional limits   Ice Chips Ice chips: Not tested   Thin Liquid Thin Liquid: Impaired Presentation: Cup;Self Fed;Straw Pharyngeal  Phase Impairments: Throat Clearing - Immediate    Nectar Thick Nectar Thick Liquid: Not tested   Honey Thick Honey Thick Liquid: Not tested   Puree Puree: Within functional limits Presentation: Self Fed;Spoon   Solid   GO   Solid: Not tested       Maxcine Ham, M.A. CCC-SLP [phone]  Maxcine Ham 08/27/2015,11:21 AM

## 2015-08-27 NOTE — Care Management Note (Signed)
Case Management Note  Patient Details  Name: [name] MRN: [id number] Date of Birth: February 14, 1921  Subjective/Objective:                 Spoke with daughter Okey Regal over the phone. She has decided to use [facility] for home hospice care after discharge. Referral placed. Hospice instructed to call daughter Pryor Ochoa to coordinate DC. Patient will be returning to her home [place] [address] with 24 hour support provided through multiple family members per Okey Regal. Patient continues IV Lasix and IV Abx at this time.   Action/Plan:  DC to home hospice.  Expected Discharge Date:                  Expected Discharge Plan:  Home w Hospice Care (Home Hosp vs. Residential Hospice)  In-House Referral:     Discharge planning Services  CM Consult  Post Acute Care Choice:  Hospice Choice offered to:  Adult Children Pryor Ochoa)  DME Arranged:    DME Agency:     HH Arranged:    HH Agency:     Status of Service:  Completed, signed off  Medicare Important Message Given:  Yes Date Medicare IM Given:    Medicare IM give by:    Date Additional Medicare IM Given:    Additional Medicare Important Message give by:     If discussed at Long Length of Stay Meetings, dates discussed:    Additional Comments:  Lawerance Sabal, RN 08/27/2015, 1:47 PM

## 2015-08-27 NOTE — Progress Notes (Signed)
Daily Progress Note   Patient Name: [name]       Date: 08/27/2015 DOB: July 04, 1921  Age: 82 y.o. MRN#: [id number] Attending Physician: Catarina Hartshorn, MD Primary Care Physician: Michiel Sites, MD Admit Date: 08/24/2015  82 year old female with complete heart block status post pacemaker placement, paroxysmal atrial fibrillation, diabetes mellitus, chronic kidney disease stage [name], mixed CHF, and hypothyroidism who has had 5 admissions in the past 6 months presents from SNF with altered mental status, hypothermia (93) and sepsis from unknown etiology. The patient is a DO NOT RESUSCITATE.   Reason for Consultation/Follow-up: Establishing goals of care and Hospice Evaluation  Subjective: "I can't see my food. I'm finding it by feel"   Patient asks for assistance eating.  Interval Events:  Upper extremity doppler is negative for clot.  Blood and urine cultures show no growth to date.  Patient appears much improved.  Awake, alert and answering questions appropriately.  We called her daughter [name] who will be by around 4 pm this afternoon.  Patient was able to speak with her daughter on the phone.   Length of Stay: 3 days  Current Medications: Scheduled Meds:  . allopurinol  100 mg Oral Daily  . aspirin  81 mg Oral Daily  . carvedilol  6.25 mg Oral BID WC  . enoxaparin (LOVENOX) injection  30 mg Subcutaneous Q24H  . furosemide  40 mg Intravenous Daily  . levothyroxine  100 mcg Oral QAC breakfast  . megestrol  400 mg Oral Daily  . pantoprazole  40 mg Oral Daily  . piperacillin-tazobactam (ZOSYN)  IV  2.25 g Intravenous Q8H  . polyvinyl alcohol  1 drop Both Eyes TID    Continuous Infusions:    PRN Meds: acetaminophen **OR** acetaminophen, albuterol,  HYDROcodone-acetaminophen, ondansetron **OR** ondansetron (ZOFRAN) IV, polyethylene glycol   Physical Exam            Awake, alert, pleasant bedbound elderly female in NAD. Attempting to stab her food with a fork but unable to see it clearly CV RRR Resp:  No increased work of breathing Upper Extremities:  Swollen bilaterally (right > left)  Vital Signs: BP 125/60 mmHg  Pulse 62  Temp(Src) 98.1 F (36.7 C) (Oral)  Resp 18  Wt 65.545 kg (144 lb 8 oz)  SpO2 97% SpO2: SpO2: 97 %  O2 Device: O2 Device: Not Delivered O2 Flow Rate:    Intake/output summary:   Intake/Output Summary (Last 24 hours) at 08/27/15 0935 Last data filed at 08/27/15 6045  Gross per 24 hour  Intake    750 ml  Output   2950 ml  Net  -2200 ml   LBM: Last BM Date: 08/26/15 Baseline Weight: Weight: 67.677 kg (149 lb 3.2 oz) Most recent weight: Weight: 65.545 kg (144 lb 8 oz)       Palliative Assessment/Data: Flowsheet Rows        Most Recent Value   Intake Tab    Referral Department  Hospitalist   Unit at Time of Referral  Other (Comment)   Palliative Care Primary Diagnosis  Sepsis/Infectious Disease   Date Notified  08/24/15   Palliative Care Type  New Palliative care   Reason for referral  Clarify Goals of Care   Date of Admission  08/24/15   Date first seen by Palliative Care  08/25/15   # of days Palliative referral response time  1 Day(s)   # of days IP prior to Palliative referral  0   Clinical Assessment    Palliative Performance Scale Score  10%   Psychosocial & Spiritual Assessment    Palliative Care Outcomes    Patient/Family meeting held?  Yes   Who was at the meeting?  Daughter [name]   Palliative Care Outcomes  Clarified goals of care, Counseled regarding hospice      Additional Data Reviewed: CBC    Component Value Date/Time   WBC 4.4 08/26/2015 0751   RBC 3.61* 08/26/2015 0751   HGB 9.6* 08/26/2015 0751   HCT 30.0* 08/26/2015 0751   PLT 137* 08/26/2015 0751   MCV  83.1 08/26/2015 0751   MCH 26.6 08/26/2015 0751   MCHC 32.0 08/26/2015 0751   RDW 24.8* 08/26/2015 0751   LYMPHSABS 0.9 08/24/2015 1715   MONOABS 0.6 08/24/2015 1715   EOSABS 0.1 08/24/2015 1715   BASOSABS 0.0 08/24/2015 1715    CMP     Component Value Date/Time   NA 148* 08/27/2015 0710   K 4.6 08/27/2015 0710   CL 122* 08/27/2015 0710   CO2 18* 08/27/2015 0710   GLUCOSE 87 08/27/2015 0710   BUN 21* 08/27/2015 0710   CREATININE 1.69* 08/27/2015 0710   CREATININE 1.57* 08/29/2011 1553   CALCIUM 9.1 08/27/2015 0710   PROT 5.3* 08/25/2015 0755   ALBUMIN 2.2* 08/25/2015 0755   AST 12* 08/25/2015 0755   ALT 10* 08/25/2015 0755   ALKPHOS 102 08/25/2015 0755   BILITOT 0.6 08/25/2015 0755   GFRNONAA 25* 08/27/2015 0710   GFRAA 29* 08/27/2015 0710       Problem List:  Patient Active Problem List   Diagnosis Date Noted  . Acute on chronic combined systolic and diastolic CHF (congestive heart failure) (HCC) 08/26/2015  . Arm swelling   . Hypothermia   . Dysphagia   . Acute encephalopathy   . Palliative care encounter   . Encounter for hospice care discussion   . Sepsis (HCC) 08/24/2015  . Dehydration 08/24/2015  . SIRS (systemic inflammatory response syndrome) (HCC) 08/24/2015  . Occlusion of right subclavian vein (HCC) 08/21/2015  . Anemia due to poor nutrition 08/03/2015  . 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 Depressed 08/03/2015  . Cardiac pacemaker in situ 08/03/2015  . Hypernatremia 08/03/2015  . Altered mental status 07/30/2015  . Elevated lactic acid level 07/30/2015  . Delirium 07/30/2015  . Edema   .  Acute on chronic diastolic CHF (congestive heart failure) (HCC) 06/24/2015  . CHF exacerbation (HCC) 06/24/2015  . Abscess of chest wall 04/26/2015  . GERD (gastroesophageal reflux disease) 04/26/2015  . Hyperkalemia 04/19/2015  . Acute renal failure superimposed on stage 3 chronic kidney disease (HCC) 04/19/2015  . Pacemaker infection (HCC) 04/14/2015   . AV block, 3rd degree (HCC)   . Bradycardia   . Symptomatic bradycardia 02/23/2015  . CAD (coronary artery disease) of bypass graft, along with subtotal left main occlusion and and RCA occlusion - graft dependent 08/03/2013  . Dermatophytosis of nail   . Obesity (BMI 30-39.9) 02/15/2013  . Ischemic cardiomyopathy, EF 45-50% 2D 03/13/12 03/15/2012  . STEMI, 03/10/12 Rx'd with PCI with recurrance 03/11/12 - redo PCI of SVG-RCA 03/13/2012  . S/P CABG x 3, LIMA - LAD, SVG-OM, SVG- PDA 08/05/11 08/05/2011    Class: Hospitalized for  . DM (diabetes mellitus), type 2 with renal complications (HCC) 05/28/2011  . History of gout 05/28/2011  . Hypothyroidism 05/27/2011  . Inf STEMI #1 -PTCA to RCA (05/2011) + LM 80;NSTEMI (07/2011) - CABG x3;  inf STEMI #2 -- SVG-RCA  PCI with DES X 3 on 03/10/12; with re-occlusion 24hrs later (inferior STEMI #3) - redo thrombectomy/PTCA 05/26/2011  . Hypertensive heart disease with CHF (congestive heart failure) (HCC) 05/26/2011  . Dyslipidemia with statin intolerance 05/26/2011     Palliative Care Assessment & Plan    1.Code Status:  DNR  2. Goals of Care/Additional Recommendations:  Continue full scope treatment with DNR code status.  Arrange for hospice care in the patient's home.  Social work order placed.  Anticipate D/C on Saturday.  Will attempt to complete a MOST form with [name] on 2/16.  Have requested swallow eval with MBS if appropriate  Please assist with feeding.  Patient unable to see.  3. Symptom Management:      Per primary team.  5. Prognosis: less than 6 months due to frequent hospitalizations, being bed bound, recurrent infections, aspiration  6. Discharge Planning:  To home with hospice services on Saturday.   Care plan was discussed with  RN & daughter  Thank you for allowing the Palliative Medicine Team to assist in the care of this patient.   Time In: 9:30 Time Out: 9:55 Total Time 25 min Prolonged Time Billed  no         Algis Downs, [place] Palliative [facility] Pager: [phone]    08/27/2015, 9:35 AM  Please contact Palliative Medicine Team phone at [phone] for questions and concerns.

## 2015-08-27 NOTE — Telephone Encounter (Signed)
New messsage  Will Dr. Herbie Baltimore be the attending physician while the pt is in hospice. Will he like to do the symptom management or the hospice staff.

## 2015-08-27 NOTE — Care Management Important Message (Signed)
Important Message  Patient Details  Name: [name] MRN: [id number] Date of Birth: July 25, 1920   Medicare Important Message Given:  Yes    Kyla Balzarine 08/27/2015, 12:21 PM

## 2015-08-27 NOTE — Progress Notes (Signed)
CSW received consult for home hospice- CSW informed RNCM who will set up home hospice services.  No further CSW needs at this time.  Merlyn Lot, LCSWA Clinical Social Worker [phone]

## 2015-08-27 NOTE — Progress Notes (Signed)
PROGRESS NOTE  [name] ZOX:[id number] DOB: [birth year] DOA: 08/24/2015 PCP: Michiel Sites, MD   Brief History 82 year old female with a history of coronary artery disease with history of STEMI, atrial fibrillation, ischemic cardiomyopathy, complete heart block status post PPM, CKD stage [name] presented with worsening confusion. The patient was recently discharged to [facility] on 08/06/2015 after a hospital admission for dehydration. Notably, the patient was seen by cardiology who noted the patient to have gained 12 pounds since her discharge date. Her furosemide was increased from 20 mg to 40 mg daily. Assessment/Plan: Acute encephalopathy -Multifactorial with suspected aspiration and fluid overload -More awake and alert  -back to baseline per daughter Acute on chronic systolic and diastolic CHF -Patient remains clinically fluid overloaded -Start intravenous furosemide -Daily weights--NEG 5 lbs -NEG 4L since admission -repeat Echo -08/06/2015 discharge weight 137 pounds SIRS -pt presented with hypothermia -Urinalysis negative for pyuria -Blood cultures remain negative -Concern about aspiration pneumonitis -Continue Zosyn -Discontinue vancomycin Pulmonary infiltrates -Concern about aspiration pneumonitis -Continue Zosyn Diabetes mellitus type 2 -The patient has been mildly hypoglycemic secondary to poor oral intake -08/25/2015 hemoglobin A1c--5.6 -Discontinue CBGs -Discontinue sliding scale insulin CKD stage [name]-4 -Baseline creatinine 1.3-1.6 -Monitor with diuresis CAD (coronary artery disease)  -hx of bypass graft, along with subtotal left main occlusion and and RCA occlusion  -no angina presently RUE edema -check duplex--NEG for DVT     Family Communication: No family at bedside Disposition Plan: Home with hospice 08/29/15   Procedures/Studies: Ct Head Wo Contrast  08/24/2015  CLINICAL DATA:  82 year old female with acute  encephalopathy EXAM: CT HEAD WITHOUT CONTRAST TECHNIQUE: Contiguous axial images were obtained from the base of the skull through the vertex without intravenous contrast. COMPARISON:  Prior head CT 07/30/2015 FINDINGS: Negative for acute intracranial hemorrhage, acute infarction, mass, mass effect, hydrocephalus or midline shift. Gray-white differentiation is preserved throughout. Relatively mild atrophy and chronic microvascular ischemic white matter change for age. No focal new scalp hematoma or evidence of calvarial injury. Atherosclerotic calcifications in both cavernous carotid arteries. IMPRESSION: No acute intracranial abnormality. Electronically Signed   By: Malachy Moan M.D.   On: 08/24/2015 23:38   Ct Head Wo Contrast  07/30/2015  CLINICAL DATA:  Altered mental status, lower abdominal pain, with anorexia. Head trauma. History of hypertension, hyperlipidemia, diabetes, chronic kidney disease. EXAM: CT HEAD WITHOUT CONTRAST TECHNIQUE: Contiguous axial images were obtained from the base of the skull through the vertex without intravenous contrast. COMPARISON:  CT head March 01, 2015 FINDINGS: Mildly motion degraded examination. The ventricles and sulci are normal for age. No intraparenchymal hemorrhage, mass effect nor midline shift. Patchy supratentorial white matter hypodensities are less than expected for patient's age and though non-specific suggest sequelae of chronic small vessel ischemic disease. No acute large vascular territory infarcts. No abnormal extra-axial fluid collections. Basal cisterns are patent. Moderate calcific atherosclerosis of the carotid siphons. No skull fracture. The included ocular globes and orbital contents are non-suspicious. Soft tissue partially opacifies the included RIGHT maxillary sinus. The mastoid air cells are well aerated. Severe LEFT temporomandibular osteoarthrosis. IMPRESSION: Negative CT head for age. Electronically Signed   By: Awilda Metro M.D.    On: 07/30/2015 03:04   Dg Chest Portable 1 View  08/24/2015  CLINICAL DATA:  Altered mental status, extensive cardiac history EXAM: PORTABLE CHEST 1 VIEW COMPARISON:  08/02/2015 FINDINGS: Cardiac enlargement right-sided pacemaker status post CABG all stable. Blunting right costophrenic angle stable. Mild  bibasilar atelectasis. No evidence of pulmonary edema. Right shoulder arthropathy. IMPRESSION: Mild atelectasis with numerous chronic findings as described above Electronically Signed   By: Esperanza Heir M.D.   On: 08/24/2015 18:36   Dg Chest Port 1 View  08/02/2015  CLINICAL DATA:  Worsening dementia and delirium. EXAM: PORTABLE CHEST 1 VIEW COMPARISON:  07/29/2015 FINDINGS: Dual lead cardiac pacemaker is unchanged. The cardiac silhouette is stably enlarged. Mediastinal contours appear intact. The aorta is torturous and contains atherosclerotic calcifications. There is no evidence of focal airspace consolidation, pleural effusion or pneumothorax. Osseous structures are without acute abnormality. Soft tissues are grossly normal. IMPRESSION: Stably enlarged cardiac silhouette. No evidence of focal airspace consolidation. Electronically Signed   By: Ted Mcalpine M.D.   On: 08/02/2015 15:52   Dg Chest Port 1 View  07/30/2015  CLINICAL DATA:  82 year old female with altered mental status EXAM: PORTABLE CHEST 1 VIEW COMPARISON:  Radiograph dated 06/24/2015 FINDINGS: Single-view of the chest demonstrates mild central vascular prominence likely a degree of congestive changes. No focal consolidation, pleural effusion, or pneumothorax. Stable cardiomegaly. Right pectoral pacemaker device. No acute osseous pathology. IMPRESSION: Stable cardiomegaly with mild congestive changes. No focal consolidation. Electronically Signed   By: Elgie Collard M.D.   On: 07/30/2015 00:24         Subjective: Patient denies fevers, chills, headache, chest pain, dyspnea, nausea, vomiting, diarrhea, abdominal pain,  dysuria, hematuria   Objective: Filed Vitals:   08/26/15 2135 08/27/15 0420 08/27/15 0500 08/27/15 1523  BP:  125/60  138/77  Pulse:  62  59  Temp:  98.1 F (36.7 C)  98.1 F (36.7 C)  TempSrc:  Oral    Resp:  18  18  Weight: 67.677 kg (149 lb 3.2 oz)  65.545 kg (144 lb 8 oz)   SpO2:  97%  98%    Intake/Output Summary (Last 24 hours) at 08/27/15 1807 Last data filed at 08/27/15 1521  Gross per 24 hour  Intake    390 ml  Output   3000 ml  Net  -2610 ml   Weight change:  Exam:   General:  Pt is alert, follows commands appropriately, not in acute distress  HEENT: No icterus, No thrush, No neck mass, [place]/AT  Cardiovascular: RRR, S1/S2, no rubs, no gallops  Respiratory: Bibasilar crackles but no wheezes. Good air movement  Abdomen: Soft/+BS, non tender, non distended, no guarding; no hepatosplenomegaly  Extremities: 1+LE edema, No lymphangitis, No petechiae, No rashes, no synovitis  Data Reviewed: Basic Metabolic Panel:  Recent Labs Lab 08/24/15 1715 08/25/15 0755 08/26/15 0751 08/27/15 0710  NA 145 148* 145 148*  K 5.0 5.1 5.2* 4.6  CL 122* 122* 123* 122*  CO2 16* 16* 17* 18*  GLUCOSE 74 75 91 87  BUN 24* 21* 19 21*  CREATININE 1.69* 1.58* 1.65* 1.69*  CALCIUM 9.2 9.2 9.0 9.1  MG  --  2.1  --   --   PHOS  --  2.6  --   --    Liver Function Tests:  Recent Labs Lab 08/24/15 1715 08/25/15 0755  AST 14* 12*  ALT 10* 10*  ALKPHOS 108 102  BILITOT 0.4 0.6  PROT 5.1* 5.3*  ALBUMIN 2.4* 2.2*   No results for input(s): LIPASE, AMYLASE in the last 168 hours. No results for input(s): AMMONIA in the last 168 hours. CBC:  Recent Labs Lab 08/24/15 1715 08/25/15 0755 08/26/15 0751  WBC 6.1 6.1 4.4  NEUTROABS 4.5  --   --  HGB 9.9* 9.8* 9.6*  HCT 31.6* 30.6* 30.0*  MCV 81.9 83.4 83.1  PLT 108* 129* 137*   Cardiac Enzymes: No results for input(s): CKTOTAL, CKMB, CKMBINDEX, TROPONINI in the last 168 hours. BNP: Invalid input(s):  POCBNP CBG:  Recent Labs Lab 08/27/15 0010 08/27/15 0414 08/27/15 0821 08/27/15 1206 08/27/15 1743  GLUCAP 107* 79 75 129* 88    Recent Results (from the past 240 hour(s))  Blood culture (routine x 2)     Status: None (Preliminary result)   Collection Time: 08/24/15  5:15 PM  Result Value Ref Range Status   Specimen Description BLOOD LEFT FOREARM  Final   Special Requests BOTTLES DRAWN AEROBIC AND ANAEROBIC 5CC  Final   Culture NO GROWTH 3 DAYS  Final   Report Status PENDING  Incomplete  Urine culture     Status: None   Collection Time: 08/24/15  6:51 PM  Result Value Ref Range Status   Specimen Description URINE, CATHETERIZED  Final   Special Requests NONE  Final   Culture NO GROWTH 2 DAYS  Final   Report Status 08/26/2015 FINAL  Final  Blood culture (routine x 2)     Status: None (Preliminary result)   Collection Time: 08/24/15  7:12 PM  Result Value Ref Range Status   Specimen Description BLOOD RIGHT FOREARM  Final   Special Requests BOTTLES DRAWN AEROBIC AND ANAEROBIC 5CC  Final   Culture NO GROWTH 3 DAYS  Final   Report Status PENDING  Incomplete  MRSA PCR Screening     Status: None   Collection Time: 08/25/15  8:06 AM  Result Value Ref Range Status   MRSA by PCR NEGATIVE NEGATIVE Final    Comment:        The GeneXpert MRSA Assay (FDA approved for NASAL specimens only), is one component of a comprehensive MRSA colonization surveillance program. It is not intended to diagnose MRSA infection nor to guide or monitor treatment for MRSA infections.      Scheduled Meds: . allopurinol  100 mg Oral Daily  . aspirin  81 mg Oral Daily  . carvedilol  6.25 mg Oral BID WC  . enoxaparin (LOVENOX) injection  30 mg Subcutaneous Q24H  . furosemide  40 mg Intravenous Daily  . levothyroxine  100 mcg Oral QAC breakfast  . megestrol  400 mg Oral Daily  . pantoprazole  40 mg Oral Daily  . piperacillin-tazobactam (ZOSYN)  IV  2.25 g Intravenous Q8H  . polyvinyl alcohol  1  drop Both Eyes TID   Continuous Infusions:    Brayan Votaw, DO  [facility] Pager [phone]  If 7PM-7AM, please contact night-coverage [email or web address] Password [email or web address] 08/27/2015, 6:07 PM   LOS: 3 days

## 2015-08-27 NOTE — Telephone Encounter (Signed)
SPOKE TO AMY ,  INFORMED HER WILL DEFER TO DR Herbie Baltimore , RN asked a contact was made to Dr Juleen China , patient's primary.  Amy states Dr Juleen China ,will not follow patient's in [facility]. patient is in hospital now under hospitalist's care  Aware will defer to Dr Herbie Baltimore

## 2015-08-27 NOTE — Progress Notes (Signed)
Echocardiogram 2D Echocardiogram has been performed.  [name] 08/27/2015, 3:08 PM

## 2015-08-28 LAB — BASIC METABOLIC PANEL
Anion gap: 10 (ref 5–15)
BUN: 20 mg/dL (ref 6–20)
CALCIUM: 9.6 mg/dL (ref 8.9–10.3)
CO2: 22 mmol/L (ref 22–32)
CREATININE: 1.85 mg/dL — AB (ref 0.44–1.00)
Chloride: 118 mmol/L — ABNORMAL HIGH (ref 101–111)
GFR calc Af Amer: 26 mL/min — ABNORMAL LOW (ref >60)
GFR, EST NON AFRICAN AMERICAN: 23 mL/min — AB (ref >60)
GLUCOSE: 77 mg/dL (ref 65–99)
POTASSIUM: 4.4 mmol/L (ref 3.5–5.1)
Sodium: 150 mmol/L — ABNORMAL HIGH (ref 135–145)

## 2015-08-28 LAB — GLUCOSE, CAPILLARY
GLUCOSE-CAPILLARY: 82 mg/dL (ref 65–99)
GLUCOSE-CAPILLARY: 72 mg/dL (ref 65–99)
GLUCOSE-CAPILLARY: 85 mg/dL (ref 65–99)
GLUCOSE-CAPILLARY: 92 mg/dL (ref 65–99)
Glucose-Capillary: 80 mg/dL (ref 65–99)
Glucose-Capillary: 85 mg/dL (ref 65–99)

## 2015-08-28 MED ORDER — ISOSORBIDE MONONITRATE ER 30 MG PO TB24
30.0000 mg | ORAL_TABLET | Freq: Every day | ORAL | Status: DC
  Administered 2015-08-29: 30 mg via ORAL
  Filled 2015-08-28 (×2): qty 1

## 2015-08-28 MED ORDER — DEXTROSE 5 % IV SOLN
INTRAVENOUS | Status: AC
  Administered 2015-08-28: 21:00:00 via INTRAVENOUS

## 2015-08-28 MED ORDER — HYDRALAZINE HCL 10 MG PO TABS
10.0000 mg | ORAL_TABLET | Freq: Two times a day (BID) | ORAL | Status: DC
  Administered 2015-08-28 – 2015-08-29 (×2): 10 mg via ORAL
  Filled 2015-08-28 (×4): qty 1

## 2015-08-28 NOTE — Telephone Encounter (Signed)
SPOKE TO AMY Dr Herbie Baltimore WILL BE ATTENDING PHYSICIAN, BUT WOULD LIKE HOSPICE STAFF TO MANAGE SYMPTOMS

## 2015-08-28 NOTE — Progress Notes (Signed)
Speech Language Pathology Treatment: Dysphagia  Patient Details Name: [name] MRN: [id number] DOB: 20-Dec-1920 Today's Date: 08/28/2015 Time: 1601-0932 SLP Time Calculation (min) (ACUTE ONLY): 20 min  Assessment / Plan / Recommendation Clinical Impression  Patient sitting upright in bed and clinician assisting and assessing her toleration of current Dys 3, thin liquid diet with her breakfast tray. Patient required assistance with self-feeding, especially with cup, as she exhibits UE tremor. Patient demonstrated prolonged mastication and oral manipulation/transit of bolus, however she did not have any residuals in oral cavity at completion of PO intake. Patient did not eat very much at all, and so concern now and in future will be her receiving enough  PO's for nutritional support. Patient exhibited one mild, delayed throat clear when taking sip of thin liquids, however this appeared to be secondary to her trying to take a large sip. She did not exhibit any further overt s/s aspiration with small sips of thin liquids.   HPI HPI: 82 year old female with complete heart block status post pacemaker placement, paroxysmal atrial fibrillation, diabetes mellitus, chronic kidney disease stage [name], mixed CHF, and hypothyroidism who has had 5 admissions in the past 6 months presents from SNF with altered mental status, hypothermia (93) and sepsis from unknown etiology.BSE completed 06/2015 with suspected primary esophageal component but recommend Dys 3 and thin liquids due to difficulty masticating meats. CXR 2/13 showed mild atelectasis/      SLP Plan  Continue with current plan of care     Recommendations  Diet recommendations: Dysphagia 3 (mechanical soft);Thin liquid Liquids provided via: Cup;No straw Medication Administration: Whole meds with puree Compensations: Slow rate;Small sips/bites;Follow solids with liquid Postural Changes and/or Swallow Maneuvers: Seated upright 90 degrees      Patient may benefit from two-handled cup or sippy cup due to UE tremors and difficulty managing styrofoam during this session.      Oral Care Recommendations: Oral care BID Follow up Recommendations: None (patient discharging home with hospice and 24 hour family support/assistance) Plan: Continue with current plan of care     GO                Pablo Lawrence 08/28/2015, 11:00 AM   Angela Nevin, MA, CCC-SLP 08/28/2015 11:05 AM

## 2015-08-28 NOTE — Progress Notes (Signed)
PROGRESS NOTE  [name] FREIHEIT PPG:[id number] DOB: Jun 20, 1921 DOA: 08/24/2015 PCP: Michiel Sites, MD  Brief History 82 year old female with a history of coronary artery disease with history of STEMI, atrial fibrillation, ischemic cardiomyopathy, complete heart block status post PPM, CKD stage [name] presented with worsening confusion. The patient was recently discharged to [facility] on 08/06/2015 after a hospital admission for dehydration. Notably, the patient was seen by cardiology who noted the patient to have gained 12 pounds since her discharge date. Her furosemide was increased from 20 mg to 40 mg daily prior to admission. Assessment/Plan: Acute encephalopathy -Multifactorial with suspected aspiration and fluid overload -More awake and alert  -back to baseline per daughter Acute on chronic systolic and diastolic CHF -Patient remains clinically fluid overloaded -Start intravenous furosemide -Daily weights--NEG 6 lbs -NEG 7.3L since admission -repeat Echo--EF 35-40%, grade 1 DD, AK of inferior myocardium -08/06/2015 discharge weight 137 pounds -Although the patient's EF has significantly decreased with new wall motion abnormality since her last echo--the plan is to continue medical treatment without any invasive procedures given the patient's advanced age and family wishes - furthermore, the plan is for discharge home with hospice care when patient is stable.  -Discontinue intravenous furosemide after today's doses as the patient appears euvolemic  -Unable to use ACE inhibitor secondary to renal failure  -Add hydralazine and Imdur  SIRS -pt presented with hypothermia -Urinalysis negative for pyuria -Blood cultures remain negative -Concern about aspiration pneumonitis -Continue Zosyn -Discontinue vancomycin Pulmonary infiltrates -Concern about aspiration pneumonitis -Continue Zosyn Diabetes mellitus type 2 -The patient has been mildly hypoglycemic secondary to  poor oral intake -08/25/2015 hemoglobin A1c--5.6 -Discontinue CBGs -Discontinue sliding scale insulin CKD stage [name]-4 -Baseline creatinine 1.3-1.6 -Monitor with diuresis CAD (coronary artery disease)  -hx of bypass graft, along with subtotal left main occlusion and and RCA occlusion  -no angina presently RUE edema -check duplex--NEG for DVT  Hypernatremia -Secondary to diuresis -Give judicious hypotonic fluid  DIspo--home with hospice 2/18 if stable     Procedures/Studies: Ct Head Wo Contrast  08/24/2015  CLINICAL DATA:  82 year old female with acute encephalopathy EXAM: CT HEAD WITHOUT CONTRAST TECHNIQUE: Contiguous axial images were obtained from the base of the skull through the vertex without intravenous contrast. COMPARISON:  Prior head CT 07/30/2015 FINDINGS: Negative for acute intracranial hemorrhage, acute infarction, mass, mass effect, hydrocephalus or midline shift. Gray-white differentiation is preserved throughout. Relatively mild atrophy and chronic microvascular ischemic white matter change for age. No focal new scalp hematoma or evidence of calvarial injury. Atherosclerotic calcifications in both cavernous carotid arteries. IMPRESSION: No acute intracranial abnormality. Electronically Signed   By: Malachy Moan M.D.   On: 08/24/2015 23:38   Ct Head Wo Contrast  07/30/2015  CLINICAL DATA:  Altered mental status, lower abdominal pain, with anorexia. Head trauma. History of hypertension, hyperlipidemia, diabetes, chronic kidney disease. EXAM: CT HEAD WITHOUT CONTRAST TECHNIQUE: Contiguous axial images were obtained from the base of the skull through the vertex without intravenous contrast. COMPARISON:  CT head March 01, 2015 FINDINGS: Mildly motion degraded examination. The ventricles and sulci are normal for age. No intraparenchymal hemorrhage, mass effect nor midline shift. Patchy supratentorial white matter hypodensities are less than expected for patient's age and  though non-specific suggest sequelae of chronic small vessel ischemic disease. No acute large vascular territory infarcts. No abnormal extra-axial fluid collections. Basal cisterns are patent. Moderate calcific atherosclerosis of the carotid siphons. No skull fracture. The included ocular  globes and orbital contents are non-suspicious. Soft tissue partially opacifies the included RIGHT maxillary sinus. The mastoid air cells are well aerated. Severe LEFT temporomandibular osteoarthrosis. IMPRESSION: Negative CT head for age. Electronically Signed   By: Awilda Metro M.D.   On: 07/30/2015 03:04   Dg Chest Portable 1 View  08/24/2015  CLINICAL DATA:  Altered mental status, extensive cardiac history EXAM: PORTABLE CHEST 1 VIEW COMPARISON:  08/02/2015 FINDINGS: Cardiac enlargement right-sided pacemaker status post CABG all stable. Blunting right costophrenic angle stable. Mild bibasilar atelectasis. No evidence of pulmonary edema. Right shoulder arthropathy. IMPRESSION: Mild atelectasis with numerous chronic findings as described above Electronically Signed   By: Esperanza Heir M.D.   On: 08/24/2015 18:36   Dg Chest Port 1 View  08/02/2015  CLINICAL DATA:  Worsening dementia and delirium. EXAM: PORTABLE CHEST 1 VIEW COMPARISON:  07/29/2015 FINDINGS: Dual lead cardiac pacemaker is unchanged. The cardiac silhouette is stably enlarged. Mediastinal contours appear intact. The aorta is torturous and contains atherosclerotic calcifications. There is no evidence of focal airspace consolidation, pleural effusion or pneumothorax. Osseous structures are without acute abnormality. Soft tissues are grossly normal. IMPRESSION: Stably enlarged cardiac silhouette. No evidence of focal airspace consolidation. Electronically Signed   By: Ted Mcalpine M.D.   On: 08/02/2015 15:52   Dg Chest Port 1 View  07/30/2015  CLINICAL DATA:  82 year old female with altered mental status EXAM: PORTABLE CHEST 1 VIEW COMPARISON:   Radiograph dated 06/24/2015 FINDINGS: Single-view of the chest demonstrates mild central vascular prominence likely a degree of congestive changes. No focal consolidation, pleural effusion, or pneumothorax. Stable cardiomegaly. Right pectoral pacemaker device. No acute osseous pathology. IMPRESSION: Stable cardiomegaly with mild congestive changes. No focal consolidation. Electronically Signed   By: Elgie Collard M.D.   On: 07/30/2015 00:24         Subjective: Patient denies fevers, chills, headache, chest pain, dyspnea, nausea, vomiting, diarrhea, abdominal pain, dysuria, hematuria   Objective: Filed Vitals:   08/27/15 1523 08/27/15 2142 08/28/15 0357 08/28/15 0540  BP: 138/77 115/62  139/96  Pulse: 59 64  75  Temp: 98.1 F (36.7 C) 97.9 F (36.6 C)  97.9 F (36.6 C)  TempSrc:  Oral  Oral  Resp: Weight:   62.869 kg (138 lb 9.6 oz)   SpO2: 98% 95%  97%    Intake/Output Summary (Last 24 hours) at 08/28/15 1824 Last data filed at 08/28/15 1642  Gross per 24 hour  Intake     50 ml  Output   2840 ml  Net  -2790 ml   Weight change: -4.808 kg (-10 lb 9.6 oz) Exam:   General:  Pt is alert, follows commands appropriately, not in acute distress  HEENT: No icterus, No thrush, No neck mass, [place]/AT  Cardiovascular: RRR, S1/S2, no rubs, no gallops  Respiratory: Fine bibasilar crackles without wheezing. Good air movement   Abdomen: Soft/+BS, non tender, non distended, no guarding  Extremities: trace LE edema, No lymphangitis, No petechiae, No rashes, no synovitis  Data Reviewed: Basic Metabolic Panel:  Recent Labs Lab 08/24/15 1715 08/25/15 0755 08/26/15 0751 08/27/15 0710 08/28/15 0617  NA 145 148* 145 148* 150*  K 5.0 5.1 5.2* 4.6 4.4  CL 122* 122* 123* 122* 118*  CO2 16* 16* 17* 18* 22  GLUCOSE 74 75 91 87 77  BUN 24* 21* 19 21* 20  CREATININE 1.69* 1.58* 1.65* 1.69* 1.85*  CALCIUM 9.2 9.2 9.0 9.1 9.6  MG  --  2.1  --   --   --   PHOS  --  2.6   --   --   --    Liver Function Tests:  Recent Labs Lab 08/24/15 1715 08/25/15 0755  AST 14* 12*  ALT 10* 10*  ALKPHOS 108 102  BILITOT 0.4 0.6  PROT 5.1* 5.3*  ALBUMIN 2.4* 2.2*   No results for input(s): LIPASE, AMYLASE in the last 168 hours. No results for input(s): AMMONIA in the last 168 hours. CBC:  Recent Labs Lab 08/24/15 1715 08/25/15 0755 08/26/15 0751  WBC 6.1 6.1 4.4  NEUTROABS 4.5  --   --   HGB 9.9* 9.8* 9.6*  HCT 31.6* 30.6* 30.0*  MCV 81.9 83.4 83.1  PLT 108* 129* 137*   Cardiac Enzymes: No results for input(s): CKTOTAL, CKMB, CKMBINDEX, TROPONINI in the last 168 hours. BNP: Invalid input(s): POCBNP CBG:  Recent Labs Lab 08/27/15 2357 08/28/15 0401 08/28/15 0805 08/28/15 1212 08/28/15 1745  GLUCAP 80 82 72 85 92    Recent Results (from the past 240 hour(s))  Blood culture (routine x 2)     Status: None (Preliminary result)   Collection Time: 08/24/15  5:15 PM  Result Value Ref Range Status   Specimen Description BLOOD LEFT FOREARM  Final   Special Requests BOTTLES DRAWN AEROBIC AND ANAEROBIC 5CC  Final   Culture NO GROWTH 4 DAYS  Final   Report Status PENDING  Incomplete  Urine culture     Status: None   Collection Time: 08/24/15  6:51 PM  Result Value Ref Range Status   Specimen Description URINE, CATHETERIZED  Final   Special Requests NONE  Final   Culture NO GROWTH 2 DAYS  Final   Report Status 08/26/2015 FINAL  Final  Blood culture (routine x 2)     Status: None (Preliminary result)   Collection Time: 08/24/15  7:12 PM  Result Value Ref Range Status   Specimen Description BLOOD RIGHT FOREARM  Final   Special Requests BOTTLES DRAWN AEROBIC AND ANAEROBIC 5CC  Final   Culture NO GROWTH 4 DAYS  Final   Report Status PENDING  Incomplete  MRSA PCR Screening     Status: None   Collection Time: 08/25/15  8:06 AM  Result Value Ref Range Status   MRSA by PCR NEGATIVE NEGATIVE Final    Comment:        The GeneXpert MRSA Assay  (FDA approved for NASAL specimens only), is one component of a comprehensive MRSA colonization surveillance program. It is not intended to diagnose MRSA infection nor to guide or monitor treatment for MRSA infections.      Scheduled Meds: . allopurinol  100 mg Oral Daily  . aspirin  81 mg Oral Daily  . carvedilol  6.25 mg Oral BID WC  . enoxaparin (LOVENOX) injection  30 mg Subcutaneous Q24H  . levothyroxine  100 mcg Oral QAC breakfast  . megestrol  400 mg Oral Daily  . pantoprazole  40 mg Oral Daily  . piperacillin-tazobactam (ZOSYN)  IV  2.25 g Intravenous Q8H  . polyvinyl alcohol  1 drop Both Eyes TID   Continuous Infusions:    [name] Bills, DO  [facility] Pager [phone]  If 7PM-7AM, please contact night-coverage [email or web address] Password [email or web address] 08/28/2015, 6:24 PM   LOS: 4 days

## 2015-08-28 NOTE — Telephone Encounter (Signed)
This is an absurd request of a specialist. Primary physicians should take on this responsibility. I will not leave her hanging, but find this absolutely absurd. I will need to talk with Dr. Juleen China.    Marykay Lex, M.D., M.S. Interventional Cardiologist   Pager # [phone] Phone # [phone] [address] [place], [place] [address]

## 2015-08-28 NOTE — Evaluation (Signed)
Physical Therapy Evaluation Patient Details Name: [name] MRN: [id number] DOB: 1920/12/15 Today's Date: 08/28/2015   History of Present Illness  Pt adm with acute encephalopathy and acute on chronic heart failure. PMH - STEMI, CABG, afib, DM, HTN, gout, pacer bil THR  Clinical Impression  Pt admitted with above diagnosis and presents to PT with functional limitations due to deficits listed below (See PT problem list). Pt needs skilled PT to maximize independence and safety to allow discharge to home with family and hospice.      Follow Up Recommendations Other (comment) (Home with hospice)    Equipment Recommendations  None recommended by PT    Recommendations for Other Services       Precautions / Restrictions Precautions Precautions: Fall Restrictions Weight Bearing Restrictions: No      Mobility  Bed Mobility Overal bed mobility: Needs Assistance Bed Mobility: Supine to Sit;Sit to Supine;Rolling Rolling: Min assist   Supine to sit: Mod assist Sit to supine: +2 for physical assistance;Mod assist   General bed mobility comments: Assist to bring legs off and to elevate trunk. Assist to bring legs back into bed and lower trunk.  Transfers Overall transfer level: Needs assistance Equipment used: Rolling walker (2 wheeled) Transfers: Sit to/from Stand Sit to Stand: +2 physical assistance;Min assist         General transfer comment: Assist to bring hips up  Ambulation/Gait             General Gait Details: Unable to attempt due to pt incontinent of stool.  Stairs            Wheelchair Mobility    Modified Rankin (Stroke Patients Only)       Balance Overall balance assessment: Needs assistance Sitting-balance support: Bilateral upper extremity supported;Feet supported Sitting balance-Leahy Scale: Poor Sitting balance - Comments: Sat EOB x 6-8 minutes with min A Postural control: Posterior lean Standing balance support: Bilateral  upper extremity supported Standing balance-Leahy Scale: Poor Standing balance comment: Walker and +2 min A                             Pertinent Vitals/Pain Pain Assessment: Faces Faces Pain Scale: Hurts little more Pain Location: pt didn't state. Going from sit to supine Pain Descriptors / Indicators: Grimacing Pain Intervention(s): Limited activity within patient's tolerance;Monitored during session;Repositioned    Home Living Family/patient expects to be discharged to:: Private residence Living Arrangements: Children Available Help at Discharge: Family Type of Home: House Home Access: Ramped entrance     Home Layout: One level Home Equipment: Environmental consultant - 2 wheels;Cane - single point;Wheelchair - manual;Bedside commode;Grab bars - tub/shower Additional Comments: Information from prior encounter    Prior Function Level of Independence: Needs assistance   Gait / Transfers Assistance Needed: Amb very short distance with walker with assist           Hand Dominance   Dominant Hand: Right    Extremity/Trunk Assessment   Upper Extremity Assessment: Generalized weakness           Lower Extremity Assessment: Generalized weakness      Cervical / Trunk Assessment: Kyphotic  Communication   Communication: No difficulties  Cognition Arousal/Alertness: Awake/alert Behavior During Therapy: Anxious Overall Cognitive Status: No family/caregiver present to determine baseline cognitive functioning Area of Impairment: Orientation;Memory;Following commands;Safety/judgement;Awareness;Problem solving Orientation Level: Disoriented to;Place;Time;Situation   Memory: Decreased short-term memory Following Commands: Follows one step commands consistently  Problem Solving: Slow processing;Requires verbal cues;Requires tactile cues      General Comments      Exercises        Assessment/Plan    PT Assessment Patient needs continued PT services  PT  Diagnosis Difficulty walking;Generalized weakness   PT Problem List Decreased strength;Decreased activity tolerance;Decreased balance;Decreased mobility;Decreased cognition;Decreased knowledge of use of DME  PT Treatment Interventions DME instruction;Gait training;Functional mobility training;Therapeutic activities;Therapeutic exercise;Balance training;Patient/family education   PT Goals (Current goals can be found in the Care Plan section) Acute Rehab PT Goals Patient Stated Goal: unable to state PT Goal Formulation: Patient unable to participate in goal setting Time For Goal Achievement: 09/04/15 Potential to Achieve Goals: Fair    Frequency Min 2X/week   Barriers to discharge        Co-evaluation               End of Session Equipment Utilized During Treatment: Gait belt Activity Tolerance: Patient limited by fatigue Patient left: in bed;with call bell/phone within reach;with bed alarm set Nurse Communication: Mobility status (stool)         Time: 3435-6861 PT Time Calculation (min) (ACUTE ONLY): 23 min   Charges:   PT Evaluation $PT Eval Moderate Complexity: 1 Procedure     PT G Codes:        Robt Okuda 2015-09-16, 3:57 PM [facility] PT [phone]

## 2015-08-28 NOTE — Progress Notes (Signed)
Notified by Delmer Islam of family request for [facility] services at home after discharge. Chart and patient information currently under review to confirm hospice eligibility.   Spoke with patient, at bedside and daughter via phone conversation to initiate education related to hospice philosophy, services and team approach to care. Family verbalized understanding of the information provided. Per discussion, plan is for discharge to home by [facility] tomorrow.  Daughter had an emergency and had to drive to [facility].  She plans on being back tomorrow for patient discharge.   Please send signed completed DNR form home with patient.  Patient will need prescriptions for discharge comfort medications.   DME needs discussed and family stated the patient wants to use her own bed and already has cane, walker, and bedside commode.  She stated they also are receiving a W/C that has ordered during her stay at [facility].  [facility] aware of the above.  Completed discharge summary will need to be faxed to [facility] at [phone] when final.  Please notify [facility] when patient is ready to leave unit at discharge-call [phone].   [facility] information and contact numbers have been given to patient and daughter during visit.   Please call with any questions.  Thank You,  Hessie Knows RN, BSN  [facility] Liaison  [phone]

## 2015-08-28 NOTE — Progress Notes (Signed)
Spoke with [facility], she states that patient will be fine for anticipated DC tomorrow (continues to receive IV Abx and IV lasix) with all DME delivered and follow up at home with hospice nurse.

## 2015-08-28 NOTE — Progress Notes (Signed)
Physical Therapy Treatment Patient Details Name: [name] MRN: [id number] DOB: May 02, 1921 Today's Date: 08/28/2015    History of Present Illness Pt adm with acute encephalopathy and acute on chronic heart failure. PMH - STEMI, CABG, afib, DM, HTN, gout, pacer bil THR    PT Comments    Pt with some progress but unable to amb due to stool incontinence.  Follow Up Recommendations  Other (comment) (Home with hospice)     Equipment Recommendations  None recommended by PT    Recommendations for Other Services       Precautions / Restrictions Precautions Precautions: Fall Restrictions Weight Bearing Restrictions: No    Mobility  Bed Mobility Overal bed mobility: Needs Assistance Bed Mobility: Sit to Supine;Rolling Rolling: Min assist   Supine to sit: Mod assist Sit to supine: +2 for physical assistance;Mod assist   General bed mobility comments: Assist to bring legs back into bed and lower trunk.  Transfers Overall transfer level: Needs assistance Equipment used: 2 person hand held assist Transfers: Sit to/from [employer] Sit to Stand: +2 physical assistance;Min assist Stand pivot transfers: +2 physical assistance;Min assist       General transfer comment: Assist to bring hips up. Pivoted with bil HHA  Ambulation/Gait             General Gait Details: Unable to attempt due to pt incontinent of stool.   Stairs            Wheelchair Mobility    Modified Rankin (Stroke Patients Only)       Balance Overall balance assessment: Needs assistance Sitting-balance support: Bilateral upper extremity supported;Feet supported Sitting balance-Leahy Scale: Poor Sitting balance - Comments: Sat EOB x 6-8 minutes with min A Postural control: Posterior lean Standing balance support: Bilateral upper extremity supported Standing balance-Leahy Scale: Poor Standing balance comment: Walker and +2 min A                     Cognition Arousal/Alertness: Awake/alert Behavior During Therapy: Anxious Overall Cognitive Status: No family/caregiver present to determine baseline cognitive functioning Area of Impairment: Orientation;Memory;Following commands;Safety/judgement;Awareness;Problem solving Orientation Level: Disoriented to;Place;Time;Situation   Memory: Decreased short-term memory Following Commands: Follows one step commands consistently     Problem Solving: Slow processing;Requires verbal cues;Requires tactile cues      Exercises      General Comments        Pertinent Vitals/Pain Pain Assessment: Faces Faces Pain Scale: Hurts little more Pain Location: Pt didn't state. Going from sit to supine Pain Descriptors / Indicators: Grimacing Pain Intervention(s): Limited activity within patient's tolerance;Monitored during session;Repositioned    Home Living Family/patient expects to be discharged to:: Private residence Living Arrangements: Children Available Help at Discharge: Family Type of Home: House Home Access: Ramped entrance   Home Layout: One level Home Equipment: Environmental consultant - 2 wheels;Cane - single point;Wheelchair - manual;Bedside commode;Grab bars - tub/shower Additional Comments: Information from prior encounter    Prior Function Level of Independence: Needs assistance  Gait / Transfers Assistance Needed: Amb very short distance with walker with assist       PT Goals (current goals can now be found in the care plan section) Acute Rehab PT Goals Patient Stated Goal: unable to state PT Goal Formulation: Patient unable to participate in goal setting Time For Goal Achievement: 09/04/15 Potential to Achieve Goals: Fair Progress towards PT goals: Progressing toward goals    Frequency  Min 2X/week    PT Plan Current plan remains  appropriate    Co-evaluation             End of Session Equipment Utilized During Treatment: Gait belt Activity Tolerance: Patient limited by  fatigue Patient left: in bed;with call bell/phone within reach;with bed alarm set     Time: 1415-1443 PT Time Calculation (min) (ACUTE ONLY): 28 min  Charges:  $Therapeutic Activity: 23-37 mins                    G Codes:      [name] 09/08/2015, 4:02 PM [employer] PT [phone]

## 2015-08-29 LAB — BASIC METABOLIC PANEL
ANION GAP: 8 (ref 5–15)
BUN: 21 mg/dL — AB (ref 6–20)
CALCIUM: 9.2 mg/dL (ref 8.9–10.3)
CO2: 22 mmol/L (ref 22–32)
Chloride: 116 mmol/L — ABNORMAL HIGH (ref 101–111)
Creatinine, Ser: 1.88 mg/dL — ABNORMAL HIGH (ref 0.44–1.00)
GFR calc Af Amer: 26 mL/min — ABNORMAL LOW (ref >60)
GFR, EST NON AFRICAN AMERICAN: 22 mL/min — AB (ref >60)
Glucose, Bld: 79 mg/dL (ref 65–99)
POTASSIUM: 3.9 mmol/L (ref 3.5–5.1)
SODIUM: 146 mmol/L — AB (ref 135–145)

## 2015-08-29 LAB — URINE MICROSCOPIC-ADD ON

## 2015-08-29 LAB — URINALYSIS, ROUTINE W REFLEX MICROSCOPIC
Bilirubin Urine: NEGATIVE
Glucose, UA: NEGATIVE mg/dL
Ketones, ur: NEGATIVE mg/dL
Nitrite: NEGATIVE
PROTEIN: NEGATIVE mg/dL
Specific Gravity, Urine: 1.016 (ref 1.005–1.030)
pH: 5 (ref 5.0–8.0)

## 2015-08-29 LAB — CULTURE, BLOOD (ROUTINE X 2)
CULTURE: NO GROWTH
Culture: NO GROWTH

## 2015-08-29 LAB — GLUCOSE, CAPILLARY
GLUCOSE-CAPILLARY: 88 mg/dL (ref 65–99)
GLUCOSE-CAPILLARY: 97 mg/dL (ref 65–99)
GLUCOSE-CAPILLARY: 89 mg/dL (ref 65–99)
Glucose-Capillary: 110 mg/dL — ABNORMAL HIGH (ref 65–99)
Glucose-Capillary: 85 mg/dL (ref 65–99)
Glucose-Capillary: 92 mg/dL (ref 65–99)

## 2015-08-29 MED ORDER — HALOPERIDOL LACTATE 5 MG/ML IJ SOLN
2.0000 mg | Freq: Once | INTRAMUSCULAR | Status: AC
  Administered 2015-08-29: 2 mg via INTRAVENOUS
  Filled 2015-08-29: qty 1

## 2015-08-29 MED ORDER — HALOPERIDOL LACTATE 5 MG/ML IJ SOLN
2.0000 mg | Freq: Four times a day (QID) | INTRAMUSCULAR | Status: DC | PRN
  Administered 2015-08-29 – 2015-08-30 (×2): 2 mg via INTRAVENOUS
  Filled 2015-08-29 (×2): qty 1

## 2015-08-29 MED ORDER — LORAZEPAM 2 MG/ML IJ SOLN
0.5000 mg | INTRAMUSCULAR | Status: DC | PRN
  Administered 2015-08-29: 0.5 mg via INTRAVENOUS
  Filled 2015-08-29 (×2): qty 1

## 2015-08-29 NOTE — Progress Notes (Signed)
PROGRESS NOTE  [name] BXI:[id number] DOB: 28-Dec-1920 DOA: 08/24/2015 PCP: Michiel Sites, MD  Brief History 82 year old female with a history of coronary artery disease with history of STEMI, atrial fibrillation, ischemic cardiomyopathy, complete heart block status post PPM, CKD stage [name] presented with worsening confusion. The patient was recently discharged to [facility] on 08/06/2015 after a hospital admission for dehydration. Notably, the patient was seen by cardiology who noted the patient to have gained 12 pounds since her discharge date. Her furosemide was increased from 20 mg to 40 mg daily prior to admission. Assessment/Plan: Acute encephalopathy -Multifactorial with suspected aspiration and fluid overload -More awake and alert  -08/29/15--pt more agitated and confused overnight--?sundowning vs Norco effect vs new UTI -repeat UA/urine culture -ativan 0.5mg  x 1 for agitation Acute on chronic systolic and diastolic CHF -Patient remains clinically fluid overloaded -Start intravenous furosemide -Daily weights--NEG 8 lbs -NEG 8.1L since admission -repeat Echo--EF 35-40%, grade 1 DD, AK of inferior myocardium -08/06/2015 discharge weight 137 pounds -08/29/15 weight 136 lbs -Although the patient's EF has significantly decreased with new wall motion abnormality since her last echo--the plan is to continue medical treatment without any invasive procedures given the patient's advanced age and family wishes - furthermore, the plan is for discharge home with hospice care when patient is stable.  -remain off diuretic today and recheck labs in am -Unable to use ACE inhibitor secondary to renal failure  -Add hydralazine and Imdur  SIRS -pt presented with hypothermia -Urinalysis negative for pyuria -Blood cultures remain negative -Concern about aspiration pneumonitis -Continue Zosyn -Discontinue vancomycin Pulmonary infiltrates -Concern about aspiration  pneumonitis -Continue Zosyn Diabetes mellitus type 2 -The patient has been mildly hypoglycemic secondary to poor oral intake -08/25/2015 hemoglobin A1c--5.6 -Discontinue CBGs -Discontinue sliding scale insulin CKD stage [name]-4 -Baseline creatinine 1.3-1.6 -Monitor with diuresis CAD (coronary artery disease)  -hx of bypass graft, along with subtotal left main occlusion and and RCA occlusion  -no angina presently RUE edema -check duplex--NEG for DVT Hypernatremia -Secondary to diuresis -Given judicious hypotonic fluid-->improved  DIspo--home with hospice 2/19 if stable    Procedures/Studies: Ct Head Wo Contrast  08/24/2015  CLINICAL DATA:  82 year old female with acute encephalopathy EXAM: CT HEAD WITHOUT CONTRAST TECHNIQUE: Contiguous axial images were obtained from the base of the skull through the vertex without intravenous contrast. COMPARISON:  Prior head CT 07/30/2015 FINDINGS: Negative for acute intracranial hemorrhage, acute infarction, mass, mass effect, hydrocephalus or midline shift. Gray-white differentiation is preserved throughout. Relatively mild atrophy and chronic microvascular ischemic white matter change for age. No focal new scalp hematoma or evidence of calvarial injury. Atherosclerotic calcifications in both cavernous carotid arteries. IMPRESSION: No acute intracranial abnormality. Electronically Signed   By: Malachy Moan M.D.   On: 08/24/2015 23:38   Dg Chest Portable 1 View  08/24/2015  CLINICAL DATA:  Altered mental status, extensive cardiac history EXAM: PORTABLE CHEST 1 VIEW COMPARISON:  08/02/2015 FINDINGS: Cardiac enlargement right-sided pacemaker status post CABG all stable. Blunting right costophrenic angle stable. Mild bibasilar atelectasis. No evidence of pulmonary edema. Right shoulder arthropathy. IMPRESSION: Mild atelectasis with numerous chronic findings as described above Electronically Signed   By: Esperanza Heir M.D.   On: 08/24/2015 18:36    Dg Chest Port 1 View  08/02/2015  CLINICAL DATA:  Worsening dementia and delirium. EXAM: PORTABLE CHEST 1 VIEW COMPARISON:  07/29/2015 FINDINGS: Dual lead cardiac pacemaker is unchanged. The cardiac silhouette is stably enlarged. Mediastinal contours  appear intact. The aorta is torturous and contains atherosclerotic calcifications. There is no evidence of focal airspace consolidation, pleural effusion or pneumothorax. Osseous structures are without acute abnormality. Soft tissues are grossly normal. IMPRESSION: Stably enlarged cardiac silhouette. No evidence of focal airspace consolidation. Electronically Signed   By: Ted Mcalpine M.D.   On: 08/02/2015 15:52         Subjective: Patient was agitated and confused overnight. She remains somewhat agitated this morning. However denied any headache, chest pain shortness breath, nausea, vomiting, abdominal pain. She was redirectable.  Objective: Filed Vitals:   08/29/15 0448 08/29/15 0448 08/29/15 0716 08/29/15 1249  BP: 94/60   122/65  Pulse: 34 60  59  Temp: 97.6 F (36.4 C)   98.5 F (36.9 C)  TempSrc: Oral     Resp: 15   19  Weight:   62 kg (136 lb 11 oz)   SpO2: 100% 99%  95%    Intake/Output Summary (Last 24 hours) at 08/29/15 1733 Last data filed at 08/29/15 1357  Gross per 24 hour  Intake 938.75 ml  Output    700 ml  Net 238.75 ml   Weight change:  Exam:   General:  Pt is alert, follows commands appropriately, not in acute distress  HEENT: No icterus, No thrush, No neck mass, [employer]/AT  Cardiovascular: RRR, S1/S2, no rubs, no gallops  Respiratory: Bibasilar Crackles without Wheezing. Good Air Movement  Abdomen: Soft/+BS, non tender, non distended, no guarding; no hepatosplenomegaly  Extremities: trace edema, No lymphangitis, No petechiae, No rashes, no synovitis  Data Reviewed: Basic Metabolic Panel:  Recent Labs Lab 08/25/15 0755 08/26/15 0751 08/27/15 0710 08/28/15 0617 08/29/15 0514  NA 148* 145  148* 150* 146*  K 5.1 5.2* 4.6 4.4 3.9  CL 122* 123* 122* 118* 116*  CO2 16* 17* 18* 22 22  GLUCOSE 75 91 87 77 79  BUN 21* 19 21* 20 21*  CREATININE 1.58* 1.65* 1.69* 1.85* 1.88*  CALCIUM 9.2 9.0 9.1 9.6 9.2  MG 2.1  --   --   --   --   PHOS 2.6  --   --   --   --    Liver Function Tests:  Recent Labs Lab 08/24/15 1715 08/25/15 0755  AST 14* 12*  ALT 10* 10*  ALKPHOS 108 102  BILITOT 0.4 0.6  PROT 5.1* 5.3*  ALBUMIN 2.4* 2.2*   No results for input(s): LIPASE, AMYLASE in the last 168 hours. No results for input(s): AMMONIA in the last 168 hours. CBC:  Recent Labs Lab 08/24/15 1715 08/25/15 0755 08/26/15 0751  WBC 6.1 6.1 4.4  NEUTROABS 4.5  --   --   HGB 9.9* 9.8* 9.6*  HCT 31.6* 30.6* 30.0*  MCV 81.9 83.4 83.1  PLT 108* 129* 137*   Cardiac Enzymes: No results for input(s): CKTOTAL, CKMB, CKMBINDEX, TROPONINI in the last 168 hours. BNP: Invalid input(s): POCBNP CBG:  Recent Labs Lab 08/28/15 2135 08/29/15 0046 08/29/15 0419 08/29/15 0759 08/29/15 1157  GLUCAP 85 110* 88 97 85    Recent Results (from the past 240 hour(s))  Blood culture (routine x 2)     Status: None   Collection Time: 08/24/15  5:15 PM  Result Value Ref Range Status   Specimen Description BLOOD LEFT FOREARM  Final   Special Requests BOTTLES DRAWN AEROBIC AND ANAEROBIC 5CC  Final   Culture NO GROWTH 5 DAYS  Final   Report Status 08/29/2015 FINAL  Final  Urine culture  Status: None   Collection Time: 08/24/15  6:51 PM  Result Value Ref Range Status   Specimen Description URINE, CATHETERIZED  Final   Special Requests NONE  Final   Culture NO GROWTH 2 DAYS  Final   Report Status 08/26/2015 FINAL  Final  Blood culture (routine x 2)     Status: None   Collection Time: 08/24/15  7:12 PM  Result Value Ref Range Status   Specimen Description BLOOD RIGHT FOREARM  Final   Special Requests BOTTLES DRAWN AEROBIC AND ANAEROBIC 5CC  Final   Culture NO GROWTH 5 DAYS  Final   Report  Status 08/29/2015 FINAL  Final  MRSA PCR Screening     Status: None   Collection Time: 08/25/15  8:06 AM  Result Value Ref Range Status   MRSA by PCR NEGATIVE NEGATIVE Final    Comment:        The GeneXpert MRSA Assay (FDA approved for NASAL specimens only), is one component of a comprehensive MRSA colonization surveillance program. It is not intended to diagnose MRSA infection nor to guide or monitor treatment for MRSA infections.      Scheduled Meds: . allopurinol  100 mg Oral Daily  . aspirin  81 mg Oral Daily  . carvedilol  6.25 mg Oral BID WC  . enoxaparin (LOVENOX) injection  30 mg Subcutaneous Q24H  . hydrALAZINE  10 mg Oral BID  . isosorbide mononitrate  30 mg Oral Daily  . levothyroxine  100 mcg Oral QAC breakfast  . pantoprazole  40 mg Oral Daily  . piperacillin-tazobactam (ZOSYN)  IV  2.25 g Intravenous Q8H  . polyvinyl alcohol  1 drop Both Eyes TID   Continuous Infusions:    Lebert Lovern, DO  [facility] Pager [phone]  If 7PM-7AM, please contact night-coverage [email or web address] Password [facility] 08/29/2015, 5:33 PM   LOS: 5 days

## 2015-08-30 LAB — BASIC METABOLIC PANEL
ANION GAP: 9 (ref 5–15)
BUN: 19 mg/dL (ref 6–20)
CALCIUM: 9.3 mg/dL (ref 8.9–10.3)
CO2: 21 mmol/L — ABNORMAL LOW (ref 22–32)
Chloride: 117 mmol/L — ABNORMAL HIGH (ref 101–111)
Creatinine, Ser: 1.64 mg/dL — ABNORMAL HIGH (ref 0.44–1.00)
GFR, EST AFRICAN AMERICAN: 30 mL/min — AB (ref >60)
GFR, EST NON AFRICAN AMERICAN: 26 mL/min — AB (ref >60)
Glucose, Bld: 106 mg/dL — ABNORMAL HIGH (ref 65–99)
Potassium: 3.7 mmol/L (ref 3.5–5.1)
Sodium: 147 mmol/L — ABNORMAL HIGH (ref 135–145)

## 2015-08-30 LAB — URINE CULTURE: Culture: >=100000

## 2015-08-30 LAB — CBC
HEMATOCRIT: 29.6 % — AB (ref 36.0–46.0)
Hemoglobin: 9.2 g/dL — ABNORMAL LOW (ref 12.0–15.0)
MCH: 25.4 pg — ABNORMAL LOW (ref 26.0–34.0)
MCHC: 31.1 g/dL (ref 30.0–36.0)
MCV: 81.8 fL (ref 78.0–100.0)
PLATELETS: 113 10*3/uL — AB (ref 150–400)
RBC: 3.62 MIL/uL — AB (ref 3.87–5.11)
RDW: 23.3 % — AB (ref 11.5–15.5)
WBC: 4.1 10*3/uL (ref 4.0–10.5)

## 2015-08-30 LAB — MAGNESIUM: Magnesium: 1.7 mg/dL (ref 1.7–2.4)

## 2015-08-30 LAB — GLUCOSE, CAPILLARY
GLUCOSE-CAPILLARY: 93 mg/dL (ref 65–99)
Glucose-Capillary: 111 mg/dL — ABNORMAL HIGH (ref 65–99)
Glucose-Capillary: 97 mg/dL (ref 65–99)
Glucose-Capillary: 112 mg/dL — ABNORMAL HIGH (ref 65–99)

## 2015-08-30 MED ORDER — LORAZEPAM 0.5 MG PO TABS: 0.5000 mg | ORAL_TABLET | Freq: Three times a day (TID) | ORAL | Status: AC

## 2015-08-30 MED ORDER — POTASSIUM CHLORIDE ER 10 MEQ PO TBCR: 10.0000 meq | EXTENDED_RELEASE_TABLET | Freq: Every day | ORAL | Status: DC

## 2015-08-30 MED ORDER — ISOSORBIDE MONONITRATE ER 30 MG PO TB24: 30.0000 mg | ORAL_TABLET | Freq: Every day | ORAL | Status: DC

## 2015-08-30 MED ORDER — HYDRALAZINE HCL 10 MG PO TABS: 10.0000 mg | ORAL_TABLET | Freq: Two times a day (BID) | ORAL | Status: DC

## 2015-08-30 MED ORDER — FUROSEMIDE 20 MG PO TABS: 40.0000 mg | ORAL_TABLET | Freq: Every day | ORAL | Status: DC

## 2015-08-30 MED ORDER — LORAZEPAM 2 MG/ML IJ SOLN
0.2500 mg | Freq: Once | INTRAMUSCULAR | Status: AC
  Administered 2015-08-30: 0.25 mg via INTRAVENOUS
  Filled 2015-08-30: qty 1

## 2015-08-30 NOTE — Consult Note (Signed)
Pharmacy Antibiotic Note  [name] is a 82 y.o. female admitted on 08/24/2015 with sepsis.  Pharmacy has been consulted for zosyn dosing for possible aspiration pneumonitis - Day #7. Cultures neg to date. Afeb, wbc wnl. SCr stable 1.64, CrCl~18.  Plan: Continue Zosyn 2.25 g IV q8h - may need to adjust if renal function improves F/u renal function, cultures, LOT, clinical progression  Weight: 137 lb 12.6 oz (62.5 kg)  Temp (24hrs), Avg:98.5 F (36.9 C), Min:98.5 F (36.9 C), Max:98.5 F (36.9 C)   Recent Labs Lab 08/24/15 1715 08/24/15 1728 08/24/15 2135 08/25/15 0755 08/26/15 0751 08/27/15 0710 08/28/15 0617 08/29/15 0514 08/30/15 0535  WBC 6.1  --   --  6.1 4.4  --   --   --  4.1  CREATININE 1.69*  --   --  1.58* 1.65* 1.69* 1.85* 1.88* 1.64*  LATICACIDVEN  --  0.58 0.51  --   --   --   --   --   --     Estimated Creatinine Clearance: 18.6 mL/min (by C-G formula based on Cr of 1.64).    Antimicrobials this admission: Vanc 2/13 >> 2/15 Zosyn 2/13 >>   Dose adjustments this admission: n/a  Microbiology results: 2/13 BCx2>>ngtd 2/13 UC>>neg 2/14 MRSA pcr: neg 2/18 urine>>sent   Thank you for allowing [place] to participate in this patients care. Signe Colt, PharmD Pager: [phone] 08/30/2015 11:51 AM

## 2015-08-30 NOTE — Discharge Summary (Signed)
Physician Discharge Summary  [name] MVH:[id number] DOB: 1920-07-26 DOA: 08/24/2015  PCP: Michiel Sites, MD  Admit date: 08/24/2015 Discharge date: 08/30/2015  Recommendations for Outpatient Follow-up:  1. Pt will need to follow up with PCP in 2 weeks post discharge 2. Please obtain BMP and CBC in one week  Discharge Diagnoses:  Acute encephalopathy -Multifactorial with suspected aspiration and fluid overload -Initially improved significantly but became more confused evening of 2/171/7 -08/29/15--pt more agitated and confused overnight--?sundowning vs Norco effect vs new UTI -repeat UA 6-30 WBC, urine culture neg at time of d/c but pt already on zosyn -08/30/15--pt more alert and less agitated, but still pleasantly confused -B12--732, TSH--0551 -pt on D#7 Zosyn -suspect degree of sundowning with waxing and waning mental status -renal function stable, afebrile and hemodynamically stable -08/30/15--discussed with daughter [name]--she felt comfortable taking pt home with hospice care.  [name] has also arranged 24/7 care for the patient -Rx Ativan 05.mg po q 8hrs prn agitation/anxiety  -CT brain neg for acute findings Acute on chronic systolic and diastolic CHF -Patient was clinically fluid overloaded -Start intravenous furosemide -Daily weights--NEG 8 lbs -NEG 8.1L since admission -repeat Echo--EF 35-40%, grade 1 DD, AK of inferior myocardium -08/06/2015 discharge weight 137 pounds -08/30/15 weight 137 lbs -Although the patient's EF has significantly decreased with new wall motion abnormality since her last echo--the plan is to continue medical treatment without any invasive procedures given the patient's advanced age and family wishes - furthermore, the plan is for discharge home with hospice care when patient is stable.  -restart lasix 40 mg daily on 08/31/15 -Unable to use ACE inhibitor secondary to renal failure  -Added hydralazine and Imdur  SIRS -pt  presented with hypothermia -Urinalysis negative for pyuria -Blood cultures remain negative -Concern about aspiration pneumonitis -Continue Zosyn--had nearly full 7 days -Discontinue vancomycin Pulmonary infiltrates -Concern about aspiration pneumonitis -Continue Zosyn--had nearly full 7 days Diabetes mellitus type 2 -The patient has been mildly hypoglycemic secondary to poor oral intake -08/25/2015 hemoglobin A1c--5.6 -Discontinue CBGs -Discontinue sliding scale insulin CKD stage [name]-4 -Baseline creatinine 1.3-1.6 -Monitor with diuresis -serum creatinjne 1.64 on day of discharge CAD (coronary artery disease)  -hx of bypass graft, along with subtotal left main occlusion and and RCA occlusion  -no angina presently RUE edema -check duplex--NEG for DVT Hypernatremia -Secondary to diuresis -Given judicious hypotonic fluid-->improved/stable  Discharge Condition: stable  Disposition: home with hospice Follow-up Information    Follow up with Hospice at [facility]   Specialty:  Hospice and Palliative Medicine   Why:  For home hospice care   Contact information:   [address] [place] [address] [phone]       Diet:dysphagia 3 with thin liquids Wt Readings from Last 3 Encounters:  08/30/15 62.5 kg (137 lb 12.6 oz)  08/20/15 67.813 kg (149 lb 8 oz)  08/20/15 65.545 kg (144 lb 8 oz)    History of present illness:  82 year old female with a history of coronary artery disease with history of STEMI, atrial fibrillation, ischemic cardiomyopathy, complete heart block status post PPM, CKD stage [name] presented with worsening confusion. The patient was recently discharged to [facility] on 08/06/2015 after a hospital admission for dehydration. Notably, the patient was seen by cardiology who noted the patient to have gained 12 pounds since her discharge date. Her furosemide was increased from 20 mg to 40 mg daily prior to admission.  The patient was fluid overloaded and  started on IV lasix with good clinical response.  Her mental  status improved initially, but then she became more confused evening of 08/28/15.  This may have been a function of sundowning with possible overdiuresis and increased serum creatinine.  Her lasix was held and pt given judicious fluids.  Her mental status showed some improvement with less agitation, but she remained pleasantly confused.  I spoke with pt's daughter and updated her.  She felt comfortable taking the pt home with hospice and she has arranged 24/7 care for the patient  Consultants: Palliative care medicine  Discharge Exam: Filed Vitals:   08/29/15 1249 08/30/15 0448  BP: 122/65 161/64  Pulse: 59 60  Temp: 98.5 F (36.9 C)   Resp: 19 18   Filed Vitals:   08/29/15 0716 08/29/15 1249 08/30/15 0448 08/30/15 0500  BP:  122/65 161/64   Pulse:  59 60   Temp:  98.5 F (36.9 C)    TempSrc:      Resp:  19 18   Weight: 62 kg (136 lb 11 oz)   62.5 kg (137 lb 12.6 oz)  SpO2:  95% 94%    General: A&O x 2, NAD, pleasant, cooperative Cardiovascular: RRR, no rub, no gallop, no S3 Respiratory: bibasilar crackles without wheeze Abdomen:soft, nontender, nondistended, positive bowel sounds Extremities: No edema, No lymphangitis, no petechiae  Discharge Instructions      Discharge Instructions    Diet - low sodium heart healthy    Complete by:  As directed      Increase activity slowly    Complete by:  As directed             Medication List    STOP taking these medications        mirtazapine 15 MG tablet  Commonly known as:  REMERON      TAKE these medications        acetaminophen 325 MG tablet  Commonly known as:  TYLENOL  Take 650 mg by mouth 2 (two) times daily.     allopurinol 100 MG tablet  Commonly known as:  ZYLOPRIM  Take 100 mg by mouth daily.     aspirin 81 MG tablet  Take 1 tablet (81 mg total) by mouth daily.     carvedilol 6.25 MG tablet  Commonly known as:  COREG  Take 1 tablet (6.25 mg  total) by mouth 2 (two) times daily with a meal.     colesevelam 625 MG tablet  Commonly known as:  WELCHOL  Take 1,875 mg by mouth See admin instructions. Take 3 tablets (1875 mg) with breakfast and with lunch     docusate sodium 100 MG capsule  Commonly known as:  COLACE  Take 100 mg by mouth daily.     ferrous sulfate 325 (65 FE) MG tablet  Take 1 tablet (325 mg total) by mouth daily with breakfast.     FLUoxetine 10 MG capsule  Commonly known as:  PROZAC  Take 1 capsule (10 mg total) by mouth daily.     folic acid 1 MG tablet  Commonly known as:  FOLVITE  Take 1 tablet (1 mg total) by mouth daily.     furosemide 20 MG tablet  Commonly known as:  LASIX  Take 2 tablets (40 mg total) by mouth daily.  Start taking on:  08/31/2015     hydrALAZINE 10 MG tablet  Commonly known as:  APRESOLINE  Take 1 tablet (10 mg total) by mouth 2 (two) times daily.     isosorbide mononitrate 30 MG 24 hr  tablet  Commonly known as:  IMDUR  Take 1 tablet (30 mg total) by mouth daily.     levothyroxine 100 MCG tablet  Commonly known as:  SYNTHROID, LEVOTHROID  Take 100 mcg by mouth daily.     LORazepam 0.5 MG tablet  Commonly known as:  ATIVAN  Take 1 tablet (0.5 mg total) by mouth every 8 (eight) hours.     megestrol 400 MG/10ML suspension  Commonly known as:  MEGACE  Take 10 mLs (400 mg total) by mouth daily.     pantoprazole 40 MG tablet  Commonly known as:  PROTONIX  Take 40 mg by mouth daily.     polyethylene glycol packet  Commonly known as:  MIRALAX / GLYCOLAX  Take 17 g by mouth daily as needed for moderate constipation.     polyvinyl alcohol 1.4 % ophthalmic solution  Commonly known as:  LIQUIFILM TEARS  Place 1 drop into both eyes 3 (three) times daily.     potassium chloride 10 MEQ tablet  Commonly known as:  K-DUR  Take 1 tablet (10 mEq total) by mouth daily.         The results of significant diagnostics from this hospitalization (including imaging,  microbiology, ancillary and laboratory) are listed below for reference.    Significant Diagnostic Studies: Ct Head Wo Contrast  08/24/2015  CLINICAL DATA:  82 year old female with acute encephalopathy EXAM: CT HEAD WITHOUT CONTRAST TECHNIQUE: Contiguous axial images were obtained from the base of the skull through the vertex without intravenous contrast. COMPARISON:  Prior head CT 07/30/2015 FINDINGS: Negative for acute intracranial hemorrhage, acute infarction, mass, mass effect, hydrocephalus or midline shift. Gray-white differentiation is preserved throughout. Relatively mild atrophy and chronic microvascular ischemic white matter change for age. No focal new scalp hematoma or evidence of calvarial injury. Atherosclerotic calcifications in both cavernous carotid arteries. IMPRESSION: No acute intracranial abnormality. Electronically Signed   By: Malachy Moan M.D.   On: 08/24/2015 23:38   Dg Chest Portable 1 View  08/24/2015  CLINICAL DATA:  Altered mental status, extensive cardiac history EXAM: PORTABLE CHEST 1 VIEW COMPARISON:  08/02/2015 FINDINGS: Cardiac enlargement right-sided pacemaker status post CABG all stable. Blunting right costophrenic angle stable. Mild bibasilar atelectasis. No evidence of pulmonary edema. Right shoulder arthropathy. IMPRESSION: Mild atelectasis with numerous chronic findings as described above Electronically Signed   By: Esperanza Heir M.D.   On: 08/24/2015 18:36   Dg Chest Port 1 View  08/02/2015  CLINICAL DATA:  Worsening dementia and delirium. EXAM: PORTABLE CHEST 1 VIEW COMPARISON:  07/29/2015 FINDINGS: Dual lead cardiac pacemaker is unchanged. The cardiac silhouette is stably enlarged. Mediastinal contours appear intact. The aorta is torturous and contains atherosclerotic calcifications. There is no evidence of focal airspace consolidation, pleural effusion or pneumothorax. Osseous structures are without acute abnormality. Soft tissues are grossly normal.  IMPRESSION: Stably enlarged cardiac silhouette. No evidence of focal airspace consolidation. Electronically Signed   By: Ted Mcalpine M.D.   On: 08/02/2015 15:52     Microbiology: Recent Results (from the past 240 hour(s))  Blood culture (routine x 2)     Status: None   Collection Time: 08/24/15  5:15 PM  Result Value Ref Range Status   Specimen Description BLOOD LEFT FOREARM  Final   Special Requests BOTTLES DRAWN AEROBIC AND ANAEROBIC 5CC  Final   Culture NO GROWTH 5 DAYS  Final   Report Status 08/29/2015 FINAL  Final  Urine culture     Status: None  Collection Time: 08/24/15  6:51 PM  Result Value Ref Range Status   Specimen Description URINE, CATHETERIZED  Final   Special Requests NONE  Final   Culture NO GROWTH 2 DAYS  Final   Report Status 08/26/2015 FINAL  Final  Blood culture (routine x 2)     Status: None   Collection Time: 08/24/15  7:12 PM  Result Value Ref Range Status   Specimen Description BLOOD RIGHT FOREARM  Final   Special Requests BOTTLES DRAWN AEROBIC AND ANAEROBIC 5CC  Final   Culture NO GROWTH 5 DAYS  Final   Report Status 08/29/2015 FINAL  Final  MRSA PCR Screening     Status: None   Collection Time: 08/25/15  8:06 AM  Result Value Ref Range Status   MRSA by PCR NEGATIVE NEGATIVE Final    Comment:        The GeneXpert MRSA Assay (FDA approved for NASAL specimens only), is one component of a comprehensive MRSA colonization surveillance program. It is not intended to diagnose MRSA infection nor to guide or monitor treatment for MRSA infections.      Labs: Basic Metabolic Panel:  Recent Labs Lab 08/25/15 0755 08/26/15 0751 08/27/15 0710 08/28/15 0617 08/29/15 0514 08/30/15 0535  NA 148* 145 148* 150* 146* 147*  K 5.1 5.2* 4.6 4.4 3.9 3.7  CL 122* 123* 122* 118* 116* 117*  CO2 16* 17* 18* 22 22 21*  GLUCOSE 75 91 87 77 79 106*  BUN 21* 19 21* 20 21* 19  CREATININE 1.58* 1.65* 1.69* 1.85* 1.88* 1.64*  CALCIUM 9.2 9.0 9.1 9.6 9.2  9.3  MG 2.1  --   --   --   --  1.7  PHOS 2.6  --   --   --   --   --    Liver Function Tests:  Recent Labs Lab 08/24/15 1715 08/25/15 0755  AST 14* 12*  ALT 10* 10*  ALKPHOS 108 102  BILITOT 0.4 0.6  PROT 5.1* 5.3*  ALBUMIN 2.4* 2.2*   No results for input(s): LIPASE, AMYLASE in the last 168 hours. No results for input(s): AMMONIA in the last 168 hours. CBC:  Recent Labs Lab 08/24/15 1715 08/25/15 0755 08/26/15 0751 08/30/15 0535  WBC 6.1 6.1 4.4 4.1  NEUTROABS 4.5  --   --   --   HGB 9.9* 9.8* 9.6* 9.2*  HCT 31.6* 30.6* 30.0* 29.6*  MCV 81.9 83.4 83.1 81.8  PLT 108* 129* 137* 113*   Cardiac Enzymes: No results for input(s): CKTOTAL, CKMB, CKMBINDEX, TROPONINI in the last 168 hours. BNP: Invalid input(s): POCBNP CBG:  Recent Labs Lab 08/29/15 2035 08/30/15 0112 08/30/15 0434 08/30/15 0800 08/30/15 1204  GLUCAP 92 111* 97 93 112*    Time coordinating discharge:  Greater than 30 minutes  Signed:  Zyonna Vardaman, DO [facility] Pager: [phone] 08/30/2015, 1:45 PM

## 2015-08-30 NOTE — Progress Notes (Signed)
Assumed care from Jamelle Rushing, RN.

## 2015-09-03 ENCOUNTER — Telehealth: Payer: Self-pay | Admitting: Cardiovascular Disease

## 2015-09-03 NOTE — Telephone Encounter (Signed)
I do not have a DPR listed for caller. Calls to patient listed # were unanswered.

## 2015-09-03 NOTE — Telephone Encounter (Signed)
Follow Up   Pt daughter called. States that the pt received a verbal clearance during OV with [name] on 08/20/2015. Pt daughter requests a call back to determine if the clearance is still ok. The pt has an evaluation on 09/11/2015 with Dr. Dione Booze for cataractso surgery. Please call back to discuss.

## 2015-09-07 ENCOUNTER — Telehealth: Payer: Self-pay | Admitting: Cardiology

## 2015-09-07 NOTE — Telephone Encounter (Signed)
I did not order any labs.  I have not seen her since before Hospice decision. I would say - OK to stopping Welchol, but chemistry labs will be helpful. Do not need Lipids.  Marykay Lex, MD

## 2015-09-07 NOTE — Telephone Encounter (Signed)
Will patient need to come to office  Visit 09/10/15 and 10/30/15/

## 2015-09-07 NOTE — Telephone Encounter (Signed)
Paul-Hospice nurse calling to see if pt's labs and appts are still necessary now that she is in Hospice-pls advise

## 2015-09-07 NOTE — Telephone Encounter (Addendum)
SPOKE TO Renae Fickle RN [facility]). PATIENT IS HOME - IN HOSPICE CARE WILL DEFER TO DR HARDING PATIENT HAS AN APPOINTMENT WITH BRYAN HAGER PA ON 09/10/15  AND LABS  10/2015 WITH DR [facility]  ALSO PAUL RN WANTED TO KNOW IF PATIENT NEEDS TO CONTINUE WELCHOL ?

## 2015-09-07 NOTE — Telephone Encounter (Signed)
I might be nice to at least come in for one of the visits.  3/2 is a 1/2 day for me, so if she comes in to see Judie Grieve as scheduled, I can pop in to say hi.   We can decide then if she needs the April visit.   I did not know that she was put on Hospice.   Marykay Lex, MD

## 2015-09-08 ENCOUNTER — Ambulatory Visit: Payer: Medicare Other | Admitting: Podiatry

## 2015-09-09 NOTE — Telephone Encounter (Signed)
Gave recommendations. He will have pt continue med for now until visit on 4/12 - they had already cancelled the 3/2 appt.

## 2015-09-09 NOTE — Telephone Encounter (Signed)
He said he waiting to hear if they could stop her Welchol.

## 2015-09-10 ENCOUNTER — Ambulatory Visit: Payer: Medicare Other | Admitting: Physician Assistant

## 2015-10-21 ENCOUNTER — Encounter: Payer: Self-pay | Admitting: Cardiology

## 2015-10-21 ENCOUNTER — Ambulatory Visit (INDEPENDENT_AMBULATORY_CARE_PROVIDER_SITE_OTHER): Payer: Medicare Other | Admitting: Cardiology

## 2015-10-21 VITALS — BP 102/60 | HR 60 | Ht 62.0 in | Wt 130.6 lb

## 2015-10-21 DIAGNOSIS — I2111 ST elevation (STEMI) myocardial infarction involving right coronary artery: Secondary | ICD-10-CM

## 2015-10-21 DIAGNOSIS — I2510 Atherosclerotic heart disease of native coronary artery without angina pectoris: Secondary | ICD-10-CM

## 2015-10-21 DIAGNOSIS — Z515 Encounter for palliative care: Secondary | ICD-10-CM

## 2015-10-21 DIAGNOSIS — Z7189 Other specified counseling: Secondary | ICD-10-CM

## 2015-10-21 DIAGNOSIS — I5042 Chronic combined systolic (congestive) and diastolic (congestive) heart failure: Secondary | ICD-10-CM

## 2015-10-21 DIAGNOSIS — I255 Ischemic cardiomyopathy: Secondary | ICD-10-CM

## 2015-10-21 DIAGNOSIS — I25709 Atherosclerosis of coronary artery bypass graft(s), unspecified, with unspecified angina pectoris: Secondary | ICD-10-CM

## 2015-10-21 DIAGNOSIS — Z951 Presence of aortocoronary bypass graft: Secondary | ICD-10-CM

## 2015-10-21 DIAGNOSIS — Z950 Presence of cardiac pacemaker: Secondary | ICD-10-CM

## 2015-10-21 DIAGNOSIS — I110 Hypertensive heart disease with heart failure: Secondary | ICD-10-CM

## 2015-10-21 DIAGNOSIS — E785 Hyperlipidemia, unspecified: Secondary | ICD-10-CM

## 2015-10-21 NOTE — Patient Instructions (Addendum)
Medications: Sliding scale Lasix: Weigh yourself when you get home, then Daily in the Morning. Your dry weight will be what your scale says on the day you return home.(here is 130 lbs - but use the home weight).   If you gain more than 3 pounds from dry weight: Increase the Lasix dosing to 40 mg (2 tab)  in the morning and 20 mg (1 tab) in the afternoon until weight returns to baseline dry weight.  If weight gain is greater than 5 pounds in 2 days: Increased to Lasix 40 mg (2 tab) twice a day and contact the office for further assistance if weight does not go down the next day.  If the weight goes down more than 3 pounds from dry weight: Hold Lasix until it returns to baseline dry weight  IF YOU ARE NOT EATING, DRINKING, OR FEELING WELL OK TO HOLD YOUR CARVEDILOL AND HYDRALAZINE  IF YOU ARE JUST FEELING DIZZY OR YOUR TOP NUMBER OF YOUR BLOOD PRESSURE IS BELOW 100 HOLD YOUR HYDRALAZINE ONLY   YOU ARE OK FOR YOUR CATARACT SURGERY  Your physician recommends that you schedule a follow-up appointment in: August OR September

## 2015-10-21 NOTE — Progress Notes (Signed)
PCP: Michiel Sites, MD  Clinic Note: Chief Complaint  Patient presents with  . Shortness of Breath    pt states very little but has gotten better, little light headed last week has not had it since then   . Chest Pain    no chest pain  . Edema    some last week     HPI: [name] is a 82 y.o. female with a PMH below who presents today for delayed post-hospital f/u.She has a history of coronary disease with at least 2 MIs and history of CABG with mild ischemic cardiomyopathy. She initially presented with an inferior STEMI found to have an occluded RCA with severe left main disease. Unfortunately she was discharged home after only PTCA of the RCA after she was initially turned down for surgery. She only then presented back with a non-ST elevation MI and heart failure that then led for her eventually being referred for CABG. She was troubled by A. fib postoperatively. He now is status post pacemaker placement for symptom management bradycardia. Over the past several years she's become more limited as far as her activity level based on her osteoarthritis symptoms most notably her right knee. She really has not had that much the way of any significant heart failure symptoms have seen her.  [name] was last seen by me on Feb 11, 2015 -->  she was doing pretty well the time. Stable bilateral swelling. She been to the emergency room in March 2016 and there was concern for possible stroke, but was not found to have an acute stroke. Since this visit, she has had a pretty complicated course of events: Recent Hospitalizations  Evaluations/:   Hospital 02/23/2015 - presented with high-grade AV block/oblique heart block was symptomatically bradycardia -->   02/24/2015:  permanent pacemaker placement by Dr. Royann Shivers on   [facility] 04/14/2015: Was seen in electrophysiology pacemaker clinic for what appeared to be a pacemaker pocket infection  04/17/2015: Temporary pacemaker  placement with pacemaker removal from left chest and lead extraction  (Dr. Lewayne Bunting) -- continued on IV antibiotics  04/21/2015:  Redo PPM placement on right chest wall [employer] Jana Half) dual-chamber pacemaker in VVI configuration (Dr. Ladona Ridgel) -->  admitted to skilled nursing facility (SNF) for generalized weakness and postop care. Left skilled nursing facility on 05/01/2015  Clinic 06/02/2015: Pacemaker follow-up with Dr. Royann Shivers; device was functioning well. 69% a pacing, 90% V-pacing, no A. fib or V. tach noted; was noted to have fluctuant fluid filling the right subclavian pocket as well, but not thought to be infectious. 100 was discontinued. Plan was to restart once pacemaker pocket was stable.  Remained euvolemic.  Brilinta resumed December 7   Hospital 12/14-16/2016: Admitted for acute on chronic CHF exacerbation; presented with exertional dyspnea and worsening edema. Improved with oxygen. Treated with Lasix and Augmentin that was continued for 7 days.  Hospital 01/19-23/2017: Altered mental status/agitation (DELERIUM) and aggressive behavior. Also noted hallucinating. Poor by mouth intake. Slight acute on chronic renal insufficiency. Was noted to have some hyponatremia (sodium bicarbonate tablet stopped, IV hydration).  Lasix made when necessary.  D/c to SNF [facility]).  Clinic 08/20/2015: Pacemaker follow-up with Dr. Royann Shivers.  Noted to have significant worsening lower extremity edema to the knees. Also hand swollen. 12 pound weight gain since January --> furosemide increased back to 40 mg daily with potassium supplementation and lab check. Concern for possible right subclavian vein occlusion.  Hostpital 02/13-19/2017:  Again presented with confusion and  delirium. Sluggish responsiveness. Noted to be hypothermic with temperature of 93.3. Renal insufficiency again noted. Was started on broad-spectrum antibiotics. --> Noted to have acute urinary retention --> on IV Lasix, not  output was roughly roughly 8 L out (however only 8 pound drop in weight).  EF noted to be reduced on echo compared to prior echoes. However decision per family was to avoid any invasive procedures.  Hydralazine/Imdur added for afterload reduction, as ARB/ACE inhibitor were held; it was concern for possible aspiration pneumonitis. Was treated for close to 7 days of Zosyn.  Palliative Care Consultation: Patient is already been noted to be DO NOT RESUSCITATE; plan was that the family did not want her to return to SNF on discharge. One to the patient to return home with 24-hour care --> and outpatient hospice was arranged. [facility])  Studies Reviewed:   08/26/2015 Upper Extremity Venous Duplex: No evidence of DVT in either upper extremity.  08/27/2015 2-D echo: EF 35-40%. Inferior akinesis (but poor quality windows). Grade 1 diastolic dysfunction. PA pressure roughly 45 mmHg - moderate pulmonary hypertension  Interval History: [name] presents today overall doing better than she had been doing. Since her last evaluation:  She is eating and drinking better now. She may be walks in the house from her chair to the kitchen or back to the bedroom. Other than that usually uses wheelchair to go anywhere else. She has not had any significant edema over the last couple weeks. Is pretty stable and she is now taking 40 mg of Lasix daily. Had some dizziness on occasion, but not routinely. Is usually positional. She denies any significant PND or orthopnea although she does sleep somewhat upright mostly to avoid aspiration. There is been no suggestion of an infant in the anginal type symptoms with rest or exertion. Nor has she noted any rapid or irregular heartbeat/palpitations. She has had some dizziness, but no symptoms that have been consistent with unresponsive type symptoms of syncope or near syncope. No TIA or amaurosis fugax symptoms.  There is one concerning factor that the daughter notes  that the patient has been having hallucinations more frequently. She is noting having "visitors usually the form of children on the come to her room and she talks to them. For the most part these are pleasant encounters, but they have there have been sometimes when she gets into arguments. She has had less episodes of agitation on the current dose of Ativan but this has made her somewhat confused on occasion.  We had a pretty long talk about overall wishes. Basically the the concept is hopefully to avoid hospitalizations unless she gets to be very sick. We won't do any invasive evaluations if it were to come to that.  ROS: A comprehensive was performed. Review of Systems  Constitutional: Positive for weight loss (Over the last several months, her by mouth intake has been worse). Negative for fever, chills and malaise/fatigue.  HENT: Positive for hearing loss. Negative for congestion, nosebleeds and tinnitus.   Eyes:       Poor vision due to cataracts  Respiratory: Positive for shortness of breath (Somewhat baseline shortness of breath). Negative for cough and wheezing.   Cardiovascular: Positive for leg swelling (Stable with standing Lasix and when necessary).  Gastrointestinal: Negative for heartburn, constipation, blood in stool and melena.  Genitourinary: Negative for dysuria, frequency, hematuria and flank pain.  Musculoskeletal: Positive for joint pain (Both knees, left knee worse than right). Negative for falls.  Neurological: Positive  for dizziness (Usually positional.). Negative for focal weakness and headaches.  Endo/Heme/Allergies: Does not bruise/bleed easily.  Psychiatric/Behavioral: Positive for depression, hallucinations (See history of present illness) and memory loss. The patient is nervous/anxious and has insomnia.   All other systems reviewed and are negative.   Past Medical History  Diagnosis Date  . ST elevation myocardial infarction (STEMI) of inferior wall (HCC) 05/2011;  03/10/2012    a) 11/'12: 100% RCA, dLM 80% -- POBA of RCA (for planned CABG, not done until re-admission with NSTEMI 1/'13);; b) 8/'13: 100% SVG-RCA (PCI & re-PCI), native RCA 100%; Patent LIMA-p-mLAD, SVG-OM  . CAD S/P percutaneous coronary angioplasty 11/'12; 8/31 & 9/1/'13    a) MV: 100% RCA-POBA, dLM left main 80%; b) 1/'13: NSTEMI --> CABG; c) 2013: 8/31 - 100% RCA & acute SVG-RCA, 100% SVG-OM, 90% LM, 80% p&mLAD, ~70% RI, 50% Cx --> PCI-SVG-RCA: Promus Element DES x 3 (prox 2.5 mm x 38 mm & 2.5 mm x 16 mm, distal 2.5 mm x 16 mm); on 9/1 - accute in-stent Thrombosis - Aspiration thrombectomy & PTCA  . S/P CABG x 4 08/05/11    Dr. Tyrone Sage: LIMA-p-mLAD, SVG-RPDA, SVG-OM; known  100% SVG-OM, Extensive PTCA of SVG-RCA;;  Hospital course complicated by Afib & PNA; after d/c cellulitis of SVG harvest site due to edema  . Episodic atrial fibrillation (HCC) 07/2011    Post-Op CABG  . Ischemic cardiomyopathy 03/2012; January 2017    a. Echo: EF 45-50% - Mild basal- mid inferolateral Hypokinesis: Gr 1 DD, mildly increased PAP.;; b. 02/2015: EF 55-60%. Normal wall motion and GR 1 DD.;; c. EF 35-40% (poor) entire inferior wall akinesis. GR 1 DD. Moderate pulmonary hypertension: PAP  ~ 45 mmHg  . Dyslipidemia, goal LDL below 70     statin intolerance (lipitor, crestor drug reaction); Welchol & Zetia  . Hypertension, essential   . DM (diabetes mellitus), type 2 with renal complications (HCC)   . Chronic kidney disease (CKD) stage G3b/A1, moderately decreased glomerular filtration rate (GFR) between 30-44 mL/min/1.73 square meter and albuminuria creatinine ratio less than 30 mg/g   . History of pneumonia     post-op from CABG  . Hypothyroidism     on synthroid  . Headache(784.0)   . Osteoarthritis   . Anxiety     PRN Xanax   . Dermatophytosis of nail   . Gout     on Allopurinol  . Lower extremity edema     chronic  . GERD (gastroesophageal reflux disease) 04/26/2015  . Acute CHF (congestive heart  failure) (HCC) 06/24/2015  . Delirium 07/30/2015  . Presence of permanent cardiac pacemaker   . Depressed 08/03/2015    Past Surgical History  Procedure Laterality Date  . Cardiac catheterization  November 2012; August and September 2013    Patent LIMA-LAD, patent she had an OM, patent stented SVG-RCA; occluded native RCA, 90% ostial left main, tandem 80% proximal and mid LAD, 70% mid Circumflex  . Coronary angioplasty  November 2012    PTCA only of RCA and setting of inferior STEMI  . Coronary artery bypass graft  08/05/2011    Procedure: CORONARY ARTERY BYPASS GRAFTING (CABG);  Surgeon: [name], MD;  Location[facility] OR;  Service: Open Heart Surgery;  Laterality: N/A;  coronary artery bypass graft times 4 using left internal mammary artery and right leg saphenous vein harvested endoscopically  . Joint replacement      both hips replaced  . I&d extremity  09/21/2011  Procedure: IRRIGATION AND DEBRIDEMENT EXTREMITY;  Surgeon: [name], MD;  Location: [facility] OR;  Service: Vascular;  Laterality: Right;  with wound vac placement  . Coronary angioplasty with stent placement  03/10/2012     infferior STEMI: Occluded native RCA and SVG-RCA --> thrombectomy and 3 stent placement to the SVG-RCA (2.5 mm right 38 mm and 2 proximal distal overlapping 2.5 mm x 16 mm Promus DES stents:  . Coronary angioplasty  03/11/2012    Inferior STEMI #3: Reoccluded SVG-RCA; extensive thrombectomy and post dilation PTCA  . Transthoracic echocardiogram  September 2013    EF 45-50%, mild hypokinesis of the basal and mid inferolateral wall, grade 1 diastolic dysfunction, mildly increased artery pressures.  . Left heart catheterization with coronary angiogram N/A 05/26/2011    Procedure: LEFT HEART CATHETERIZATION WITH CORONARY ANGIOGRAM;  Surgeon: Marykay Lex, MD;  Location: [facility] CATH LAB;  Service: Cardiovascular;  Laterality: N/A;  . Percutaneous coronary intervention-balloon only N/A 05/26/2011     Procedure: PERCUTANEOUS CORONARY INTERVENTION-BALLOON ONLY;  Surgeon: Marykay Lex, MD;  Location: [facility] CATH LAB;  Service: Cardiovascular;  Laterality: N/A;  . Left heart cath N/A 03/10/2012    Procedure: LEFT HEART CATH;  Surgeon: Herby Abraham, MD;  Location: [facility] CATH LAB;  Service: Cardiovascular;  Laterality: N/A;  . Percutaneous coronary stent intervention (pci-s)  03/10/2012    Procedure: PERCUTANEOUS CORONARY STENT INTERVENTION (PCI-S);  Surgeon: Herby Abraham, MD;  Location: [facility] CATH LAB;  Service: Cardiovascular;;  . Left heart catheterization with coronary angiogram Bilateral 03/11/2012    Procedure: LEFT HEART CATHETERIZATION WITH CORONARY ANGIOGRAM;  Surgeon: Thurmon Fair, MD;  Location: [facility] CATH LAB;  Service: Cardiovascular;  Laterality: Bilateral;  . Ep implantable device N/A 02/24/2015    Procedure: Pacemaker Implant;  Surgeon: Thurmon Fair, MD;  Location: [facility] INVASIVE CV LAB;  Service: Cardiovascular;  Laterality: N/A;  . Ep implantable device N/A 04/17/2015    Procedure: Lead Extraction, Can Extraction;  Surgeon: Marinus Maw, MD;  Location: [facility] INVASIVE CV LAB;  Service: Cardiovascular;  Laterality: N/A;  . Cardiac catheterization N/A 04/17/2015    Procedure: Temporary Pacemaker;  Surgeon: Marinus Maw, MD;  Location: [facility] INVASIVE CV LAB;  Service: Cardiovascular;  Laterality: N/A;  . Ep implantable device N/A 04/21/2015    Procedure: Pacemaker Implant;  Surgeon: Marinus Maw, MD;  Location: [facility] INVASIVE CV LAB;  Service: Cardiovascular;  Laterality: N/A;  . Pacemaker lead removal  04/21/2015    Procedure: Pacemaker Lead Removal;  Surgeon: Marinus Maw, MD;  Location: [facility] INVASIVE CV LAB;  Service: Cardiovascular;;  LV   . Transthoracic echocardiogram  January 2017     EF 35-40% (poor) entire inferior wall akinesis. GR 1 DD. Moderate pulmonary hypertension: PAP  ~ 45 mmHg    Prior to Admission medications   Medication Sig Start Date End Date Taking? Authorizing Provider    acetaminophen (TYLENOL) 325 MG tablet Take 650 mg by mouth 2 (two) times daily.   Yes Historical Provider, MD  aspirin 81 MG tablet Take 1 tablet (81 mg total) by mouth daily. 08/20/15  Yes Mihai Croitoru, MD  carvedilol (COREG) 6.25 MG tablet Take 1 tablet (6.25 mg total) by mouth 2 (two) times daily with a meal. 08/15/14  Yes Marykay Lex, MD  furosemide (LASIX) 20 MG tablet Take 2 tablets (40 mg total) by mouth daily. 08/31/15  Yes Catarina Hartshorn, MD  hydrALAZINE (APRESOLINE) 10 MG tablet Take 1 tablet (10 mg total) by  mouth 2 (two) times daily. 08/30/15  Yes Catarina Hartshorn, MD  isosorbide mononitrate (IMDUR) 30 MG 24 hr tablet Take 1 tablet (30 mg total) by mouth daily. 08/30/15  Yes Catarina Hartshorn, MD  LORazepam (ATIVAN) 0.5 MG tablet Take 1 tablet (0.5 mg total) by mouth every 8 (eight) hours. 08/30/15  Yes Catarina Hartshorn, MD  polyethylene glycol (MIRALAX / GLYCOLAX) packet Take 17 g by mouth daily as needed for moderate constipation.   Yes Historical Provider, MD  polyvinyl alcohol (LIQUIFILM TEARS) 1.4 % ophthalmic solution Place 1 drop into both eyes as directed.    Yes Historical Provider, MD  potassium chloride (K-DUR) 10 MEQ tablet Take 1 tablet (10 mEq total) by mouth daily. 08/30/15  Yes Catarina Hartshorn, MD   Allergies  Allergen Reactions  . Statins Rash  . Tape Rash    Please use paper tape   Social History   Social History  . Marital Status: Widowed    Spouse Name: N/A  . Number of Children: N/A  . Years of Education: N/A   Social History Main Topics  . Smoking status: Never Smoker   . Smokeless tobacco: Never Used  . Alcohol Use: No  . Drug Use: No  . Sexual Activity: No   Other Topics Concern  . 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 She is very active up and around the house, does not do routine exercise.   Does not smoke or drink.   Her husband died Jul 01, 2015.  Has not been eating or drinking well since.  usual caregiver is her daughter: Barrett Henle    family history includes Breast cancer in her mother; Heart disease in her father.   Wt Readings from Last 3 Encounters:  10/21/15 130 lb 9.6 oz (59.24 kg)  08/30/15 137 lb 12.6 oz (62.5 kg)  08/20/15 149 lb 8 oz (67.813 kg)    PHYSICAL EXAM BP 102/60 mmHg  Pulse 60  Ht 5\' 2"  (1.575 m)  Wt 130 lb 9.6 oz (59.24 kg)  BMI 23.88 kg/m2 General appearance: alert, cooperative, appears stated age, no distress.  Actually well-nourished and well-groomed appearing.  Neck: no adenopathy, no carotid bruit and no JVD Lungs: clear to auscultation bilaterally, normal percussion bilaterally and non-labored Heart: regular rate and rhythm, S1 & S2 normal, no murmur, click, rub or gallop; Non-displaced PMI Abdomen: soft, non-tender; bowel sounds normal; no masses,  no organomegaly; no HJR Extremities: extremities normal, atraumatic, no cyanosis, and edema ~1+ bilaterally Pulses: 2+ and symmetric; Skin: mobility and turgor normal, no evidence of bleeding or bruising, no lesions noted and temperature normal or  Neurologic: Mental status: Alert, oriented, thought content appropriate -- she is very quiet and reserved. Doesn't answer questions openly. But she does admit to having had some hallucination episodes. Does seem to be confused today.  Cranial nerves: normal (II-XII grossly intact)    Adult ECG Report N/A  Other studies Reviewed: Additional studies/ records that were reviewed today include:  Recent Labs:     Chemistry      Component Value Date/Time   NA 147* 08/30/2015 0535   K 3.7 08/30/2015 0535   CL 117* 08/30/2015 0535   CO2 21* 08/30/2015 0535   BUN 19 08/30/2015 0535   CREATININE 1.64* 08/30/2015 0535   CREATININE 1.57* 08/29/2011 1553      Component Value Date/Time   CALCIUM  9.3 08/30/2015 0535   ALKPHOS 102 08/25/2015 0755   AST 12* 08/25/2015 0755   ALT 10* 08/25/2015 0755    BILITOT 0.6 08/25/2015 0755        ASSESSMENT / PLAN: Problem List Items Addressed This Visit    STEMI, 03/10/12 Rx'd with PCI with recurrance 03/11/12 - redo PCI of SVG-RCA - Primary (Chronic)    History of 2 back-to-back MIs -followed by yet again another STEMI. . Initial event led to probably premature discharge and then she subsequently returned again with an MI which led to CABG.[place] had another MI when her vein graft closed down. She had multiple stents placed the right coronary artery vein graft. Her echocardiogram that was most recently done the time of her being somewhat sick showed a reduction in her EF again with inferior apical akinesis/akinesis with now an EF of 3540%.        Relevant Medications   furosemide (LASIX) 40 MG tablet   S/P CABG x 3, LIMA - LAD, SVG-OM, SVG- PDA 08/05/11 (Chronic)   Ischemic cardiomyopathy, EF 35-40 % 2D January 2017 - reduced from 45-50 in 2013 (Chronic)    She had been on pretty well without major heart failure symptoms. I think the problem is his if she is not feeling well, not eating and drinking well she then gets dehydrated on a standing dose of Lasix. She is on reasonable dose of beta blocker and now with hydralazine/Imdur for afterload reduction given her recent renal insufficiency. For now would like to continue with low-dose Lasix and use a sliding scale. -- See patient instructions      Relevant Medications   furosemide (LASIX) 40 MG tablet   Inf STEMI #1 -PTCA to RCA (05/2011) + LM 80;NSTEMI (07/2011) - CABG x3;  inf STEMI #2 -- SVG-RCA  PCI with DES X 3 on 03/10/12; with re-occlusion 24hrs later (inferior STEMI #3) - redo thrombectomy/PTCA (Chronic)    Thankfully no angina symptoms. Brilinta has been stopped, she remains on aspirin. To minimize bleeding issues in light of her now being on hospice care, I think this is reasonable. Is on carvedilol. If she were to notice dizziness with hypotension, would take a half a dose. She is on  low-dose hydralazine was Imdur. This would actually be held for hypotension. Mostly for longevity reasons, is reasonable for her not to be on statin.      Relevant Medications   furosemide (LASIX) 40 MG tablet   Hypertensive heart disease with CHF (congestive heart failure) (HCC) (Chronic)    If anything, she is borderline hypotensive today. She has had labile pressures. I would like to send her blood pressures less than 100 she should take half dose of carvedilol and hold her hydralazine      Relevant Medications   furosemide (LASIX) 40 MG tablet   Encounter for hospice care discussion    Based on previous discussions, the patient has been DNR/DNI. She had a palliative care consult during her last admission and when she was very sick. They made the decision and that she not go back to skilled nursing facility after leaving the hospital. She'll be discharged home with plans for home hospice. The patient's primary provider declined being the hospice attending, and I was asked to fill this role. I have agreed to fill the role for cardiac standpoint, but would have requested that the hospice team manage noncardiac issues. I'm concerned with her hallucinations and potential for urinary retention and other altered mental status  issues. We discussed whether or not she would actually return back to the hospital. This is still in the process of being determined  She is definitely not going to be undergoing any further invasive procedures. I think hospice care is definitely indicated as she has chronic multiple comorbidities, but currently a clinical standpoint is not appear to be in any imminent danger of death.      Dyslipidemia with statin intolerance (Chronic)    She had been on WelChol. I actually think that it be fine for her to stop the WelChol.  At this point I think are okay not checking lipids.      Chronic combined systolic and diastolic CHF (congestive heart failure) (HCC) (Chronic)     Intermittent episodes of dehydration and then heart failure. Her weight is actually pretty far down for her at this point in time. She sees relatively euvolemic. I thinks keeping a low-dose diuretic on board is reasonable with sliding scale. If she is not eating and drinking well, she should hold her Lasix. If she is hypotensive she should hold her hydralazine/Imdur.  Sliding scale Lasix: Weigh yourself when you get home, then Daily in the Morning. Your dry weight will be what your scale says on the day you return home.(here is 130 lbs - but use the home weight).   If you gain more than 3 pounds from dry weight: Increase the Lasix dosing to 40 mg (2 tab)  in the morning and 20 mg (1 tab) in the afternoon until weight returns to baseline dry weight.  If weight gain is greater than 5 pounds in 2 days: Increased to Lasix 40 mg (2 tab) twice a day and contact the office for further assistance if weight does not go down the next day.  If the weight goes down more than 3 pounds from dry weight: Hold Lasix until it returns to baseline dry weight      Relevant Medications   furosemide (LASIX) 40 MG tablet   Cardiac pacemaker in situ (Chronic)    Being followed by pacemaker clinic. Finally no signs of infection. No further syncopal episodes.      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      Relevant Medications   furosemide (LASIX) 40 MG tablet      Current medicines are reviewed at length with the patient today. (+/- concerns) n/a The following changes have been made:  Sliding scale Lasix: Weigh yourself when you get home, then Daily in the Morning. Your dry weight will be what your scale says on the day you return home.(here is 130 lbs - but use the home weight).   If you gain more than 3 pounds from dry weight: Increase the Lasix dosing to 40 mg (2 tab)  in the morning  and 20 mg (1 tab) in the afternoon until weight returns to baseline dry weight.  If weight gain is greater than 5 pounds in 2 days: Increased to Lasix 40 mg (2 tab) twice a day and contact the office for further assistance if weight does not go down the next day.  If the weight goes down more than 3 pounds from dry weight: Hold Lasix until it returns to baseline dry weight  IF YOU ARE NOT EATING, DRINKING, OR FEELING WELL OK TO HOLD YOUR CARVEDILOL AND HYDRALAZINE  IF YOU ARE JUST FEELING DIZZY OR  YOUR TOP NUMBER OF YOUR BLOOD PRESSURE IS BELOW 100 HOLD YOUR HYDRALAZINE ONLY   YOU ARE OK FOR YOUR CATARACT SURGERY   Studies Ordered:   No orders of the defined types were placed in this encounter.    This was a very lengthy clinic visit. The patient has had multiple hospitalizations and other evaluations that are pertinent to the reviewed.  With her multiple comorbidities, we had several discussions about her overall well-being and plans of care in the future. We discussed hospice care were discussed heart failure management and future plans for potential hospitalizations versus procedures. Well over 45 minutes was spent with the patient, and an additional close to 1 hour was spent in chart review and preparation.  Greater than 50% of the total time with the patient was spent in direct consultation and discussion.   Follow-up in August/September 2017     [facility], Piedad Climes, M.D., M.S. Interventional Cardiologist   Pager # [phone] Phone # [phone] [address] [place], [place] [address]

## 2015-10-27 ENCOUNTER — Encounter: Payer: Self-pay | Admitting: Cardiology

## 2015-10-27 DIAGNOSIS — Z7189 Other specified counseling: Secondary | ICD-10-CM | POA: Insufficient documentation

## 2015-10-27 NOTE — Assessment & Plan Note (Signed)
History of 2 back-to-back MIs -followed by yet again another STEMI. . Initial event led to probably premature discharge and then she subsequently returned again with an MI which led to CABG.[place] had another MI when her vein graft closed down. She had multiple stents placed the right coronary artery vein graft. Her echocardiogram that was most recently done the time of her being somewhat sick showed a reduction in her EF again with inferior apical akinesis/akinesis with now an EF of 3540%.

## 2015-10-27 NOTE — Assessment & Plan Note (Signed)
Based on previous discussions, the patient has been DNR/DNI. She had a palliative care consult during her last admission and when she was very sick. They made the decision and that she not go back to skilled nursing facility after leaving the hospital. She'll be discharged home with plans for home hospice. The patient's primary provider declined being the hospice attending, and I was asked to fill this role. I have agreed to fill the role for cardiac standpoint, but would have requested that the hospice team manage noncardiac issues. I'm concerned with her hallucinations and potential for urinary retention and other altered mental status issues. We discussed whether or not she would actually return back to the hospital. This is still in the process of being determined  She is definitely not going to be undergoing any further invasive procedures. I think hospice care is definitely indicated as she has chronic multiple comorbidities, but currently a clinical standpoint is not appear to be in any imminent danger of death.

## 2015-10-27 NOTE — Assessment & Plan Note (Signed)
Thankfully no angina symptoms. Brilinta has been stopped, she remains on aspirin. To minimize bleeding issues in light of her now being on hospice care, I think this is reasonable. Is on carvedilol. If she were to notice dizziness with hypotension, would take a half a dose. She is on low-dose hydralazine was Imdur. This would actually be held for hypotension. Mostly for longevity reasons, is reasonable for her not to be on statin.

## 2015-10-27 NOTE — Assessment & Plan Note (Signed)
She had been on pretty well without major heart failure symptoms. I think the problem is his if she is not feeling well, not eating and drinking well she then gets dehydrated on a standing dose of Lasix. She is on reasonable dose of beta blocker and now with hydralazine/Imdur for afterload reduction given her recent renal insufficiency. For now would like to continue with low-dose Lasix and use a sliding scale. -- See patient instructions

## 2015-10-27 NOTE — Assessment & Plan Note (Signed)
If anything, she is borderline hypotensive today. She has had labile pressures. I would like to send her blood pressures less than 100 she should take half dose of carvedilol and hold her hydralazine

## 2015-10-27 NOTE — Assessment & Plan Note (Signed)
Being followed by pacemaker clinic. Finally no signs of infection. No further syncopal episodes.

## 2015-10-27 NOTE — Assessment & Plan Note (Signed)
Intermittent episodes of dehydration and then heart failure. Her weight is actually pretty far down for her at this point in time. She sees relatively euvolemic. I thinks keeping a low-dose diuretic on board is reasonable with sliding scale. If she is not eating and drinking well, she should hold her Lasix. If she is hypotensive she should hold her hydralazine/Imdur.  Sliding scale Lasix: Weigh yourself when you get home, then Daily in the Morning. Your dry weight will be what your scale says on the day you return home.(here is 130 lbs - but use the home weight).   If you gain more than 3 pounds from dry weight: Increase the Lasix dosing to 40 mg (2 tab)  in the morning and 20 mg (1 tab) in the afternoon until weight returns to baseline dry weight.  If weight gain is greater than 5 pounds in 2 days: Increased to Lasix 40 mg (2 tab) twice a day and contact the office for further assistance if weight does not go down the next day.  If the weight goes down more than 3 pounds from dry weight: Hold Lasix until it returns to baseline dry weight

## 2015-10-27 NOTE — Assessment & Plan Note (Signed)
She had been on WelChol. I actually think that it be fine for her to stop the WelChol.  At this point I think are okay not checking lipids.

## 2015-10-27 NOTE — Assessment & Plan Note (Signed)
As well as she is doing currently, she doesn't seem to be in any eminent danger for dying, however she is extremely fragile. She requires a lot of ongoing monitoring in caregiving. I have agreed to be the attending of record for her hospice, but have asked that the hospice team manage noncardiac issues. The daughter tells me that the hospice M.D. has taken over several of these functions.

## 2015-10-27 NOTE — Assessment & Plan Note (Signed)
She had an occluded vein graft to the RCA treated with multiple stents.

## 2015-11-18 ENCOUNTER — Ambulatory Visit (INDEPENDENT_AMBULATORY_CARE_PROVIDER_SITE_OTHER): Admitting: *Deleted

## 2015-11-18 ENCOUNTER — Telehealth: Payer: Self-pay | Admitting: Cardiology

## 2015-11-18 DIAGNOSIS — R001 Bradycardia, unspecified: Secondary | ICD-10-CM

## 2015-11-18 DIAGNOSIS — Z950 Presence of cardiac pacemaker: Secondary | ICD-10-CM | POA: Diagnosis not present

## 2015-11-18 DIAGNOSIS — I441 Atrioventricular block, second degree: Secondary | ICD-10-CM | POA: Diagnosis not present

## 2015-11-18 NOTE — Progress Notes (Signed)
Remote pacemaker transmission.   

## 2015-11-18 NOTE — Telephone Encounter (Signed)
Renae Fickle called in wanting to speak with a nurse about possibly starting the pt on an Antibiotic due to some lab results he received from the pt. Please f/u with him.   Thanks

## 2015-11-18 NOTE — Telephone Encounter (Signed)
I did not see any Urinalysis results.  As I am not routinely in the office to handle these issues & am often not the type of MD to be handling such issues, I would like for the Hospice team to handle things like UTIs, Bronchitis, PNA, etc.  That is why I reluctantly signed as Attending.  This is a PCP role, not a Cardiologist role.  I signed in order to make sure that she was covered, but feel very strongly that her PCP abandoned her in this.  If there is a recommendation of Abx, I would be happy to approve.  Marykay Lex, MD

## 2015-11-18 NOTE — Telephone Encounter (Signed)
--   Spoke with [name] - their protocol is to consult with referring MD to see if they want to write orders -- Advised it does not appear that [name]. [name] writes her Hospice orders -- Per [name], results for urinalysis/urine culture were faxed to office for patient, he received results yesterday around 5pm -- Advised that I am not sure if these results were received yet or not @ our office, but the MD is not in the office to review and I would not advise delaying treatment waiting on him to review these results, especially given that cardiology does not generally write Rx for treatment of what sounds like a positive UTI nor does it appear that [name]. [name] has been writing Hospice orders for patient -- [name] voiced understanding and will consult Hospice MD  Per telephone note: 08/28/15 [name] Chad, RN at 08/28/2015 9:31 AM     Status: Signed       Expand All Collapse All   SPOKE TO [name] [name] WILL BE ATTENDING PHYSICIAN, BUT WOULD LIKE HOSPICE STAFF TO MANAGE SYMPTOMS

## 2015-11-20 ENCOUNTER — Telehealth: Payer: Self-pay | Admitting: *Deleted

## 2015-11-20 NOTE — Telephone Encounter (Signed)
LEFT  DETAILED MESSAGE ON  Renae Fickle ,RN'S VOICEMAIL TO [facility] CONCERNING U/A - WITH CULTURE WHEN RESULT ARE COMPLETED  ANY QUESTION MAY CALL BACK

## 2015-11-20 NOTE — Telephone Encounter (Signed)
-----   Message from Marykay Lex, MD sent at 11/19/2015 11:18 PM EDT ----- In order to know if we need to, & how to treat - we need Culture & Sensitivities.  Would still like to defer to Hospice team.  Bryan Lemma, MD

## 2015-12-07 LAB — CUP PACEART INCLINIC DEVICE CHECK
Battery Remaining Longevity: 119 mo
Battery Voltage: 2.79 V
Brady Statistic AP VP Percent: 58 %
Brady Statistic AS VP Percent: 42 %
Brady Statistic AS VS Percent: 0 %
Implantable Lead Implant Date: 20161011
Implantable Lead Model: 5076
Lead Channel Impedance Value: 549 Ohm
Lead Channel Setting Pacing Amplitude: 2 V
Lead Channel Setting Pacing Amplitude: 2.5 V
Lead Channel Setting Sensing Sensitivity: 4 mV
MDC IDC LEAD IMPLANT DT: 20161011
MDC IDC LEAD LOCATION: 753859
MDC IDC LEAD LOCATION: 753860
MDC IDC MSMT BATTERY IMPEDANCE: 105 Ohm
MDC IDC MSMT LEADCHNL RV IMPEDANCE VALUE: 543 Ohm
MDC IDC SESS DTM: 20170209181529
MDC IDC SET LEADCHNL RV PACING PULSEWIDTH: 0.4 ms
MDC IDC STAT BRADY AP VS PERCENT: 0 %

## 2015-12-16 ENCOUNTER — Encounter: Payer: Self-pay | Admitting: Cardiology

## 2015-12-22 ENCOUNTER — Observation Stay (HOSPITAL_COMMUNITY)

## 2015-12-22 ENCOUNTER — Encounter (HOSPITAL_COMMUNITY): Payer: Self-pay | Admitting: *Deleted

## 2015-12-22 ENCOUNTER — Observation Stay (HOSPITAL_COMMUNITY)
Admission: EM | Admit: 2015-12-22 | Discharge: 2015-12-24 | Disposition: A | Discharge status: Home / Self Care | Attending: Internal Medicine | Admitting: Internal Medicine

## 2015-12-22 DIAGNOSIS — F329 Major depressive disorder, single episode, unspecified: Secondary | ICD-10-CM | POA: Diagnosis not present

## 2015-12-22 DIAGNOSIS — I25729 Atherosclerosis of autologous artery coronary artery bypass graft(s) with unspecified angina pectoris: Secondary | ICD-10-CM | POA: Diagnosis not present

## 2015-12-22 DIAGNOSIS — F419 Anxiety disorder, unspecified: Secondary | ICD-10-CM | POA: Diagnosis not present

## 2015-12-22 DIAGNOSIS — I25709 Atherosclerosis of coronary artery bypass graft(s), unspecified, with unspecified angina pectoris: Secondary | ICD-10-CM | POA: Diagnosis present

## 2015-12-22 DIAGNOSIS — M19011 Primary osteoarthritis, right shoulder: Secondary | ICD-10-CM | POA: Insufficient documentation

## 2015-12-22 DIAGNOSIS — I272 Other secondary pulmonary hypertension: Secondary | ICD-10-CM | POA: Insufficient documentation

## 2015-12-22 DIAGNOSIS — K219 Gastro-esophageal reflux disease without esophagitis: Secondary | ICD-10-CM | POA: Diagnosis not present

## 2015-12-22 DIAGNOSIS — E785 Hyperlipidemia, unspecified: Secondary | ICD-10-CM | POA: Insufficient documentation

## 2015-12-22 DIAGNOSIS — E039 Hypothyroidism, unspecified: Secondary | ICD-10-CM | POA: Insufficient documentation

## 2015-12-22 DIAGNOSIS — R531 Weakness: Secondary | ICD-10-CM | POA: Diagnosis not present

## 2015-12-22 DIAGNOSIS — Z7982 Long term (current) use of aspirin: Secondary | ICD-10-CM | POA: Diagnosis not present

## 2015-12-22 DIAGNOSIS — M109 Gout, unspecified: Secondary | ICD-10-CM | POA: Insufficient documentation

## 2015-12-22 DIAGNOSIS — J320 Chronic maxillary sinusitis: Secondary | ICD-10-CM | POA: Insufficient documentation

## 2015-12-22 DIAGNOSIS — K122 Cellulitis and abscess of mouth: Secondary | ICD-10-CM | POA: Diagnosis present

## 2015-12-22 DIAGNOSIS — M1991 Primary osteoarthritis, unspecified site: Secondary | ICD-10-CM | POA: Diagnosis not present

## 2015-12-22 DIAGNOSIS — M50322 Other cervical disc degeneration at C5-C6 level: Secondary | ICD-10-CM | POA: Insufficient documentation

## 2015-12-22 DIAGNOSIS — Z950 Presence of cardiac pacemaker: Secondary | ICD-10-CM | POA: Diagnosis present

## 2015-12-22 DIAGNOSIS — R404 Transient alteration of awareness: Secondary | ICD-10-CM | POA: Diagnosis not present

## 2015-12-22 DIAGNOSIS — N183 Chronic kidney disease, stage 3 unspecified: Secondary | ICD-10-CM | POA: Diagnosis present

## 2015-12-22 DIAGNOSIS — I255 Ischemic cardiomyopathy: Secondary | ICD-10-CM | POA: Diagnosis present

## 2015-12-22 DIAGNOSIS — K113 Abscess of salivary gland: Secondary | ICD-10-CM | POA: Diagnosis not present

## 2015-12-22 DIAGNOSIS — I252 Old myocardial infarction: Secondary | ICD-10-CM | POA: Insufficient documentation

## 2015-12-22 DIAGNOSIS — M50323 Other cervical disc degeneration at C6-C7 level: Secondary | ICD-10-CM | POA: Insufficient documentation

## 2015-12-22 DIAGNOSIS — Z66 Do not resuscitate: Secondary | ICD-10-CM | POA: Diagnosis not present

## 2015-12-22 DIAGNOSIS — Z888 Allergy status to other drugs, medicaments and biological substances status: Secondary | ICD-10-CM | POA: Insufficient documentation

## 2015-12-22 DIAGNOSIS — Z951 Presence of aortocoronary bypass graft: Secondary | ICD-10-CM

## 2015-12-22 DIAGNOSIS — Z8614 Personal history of Methicillin resistant Staphylococcus aureus infection: Secondary | ICD-10-CM | POA: Insufficient documentation

## 2015-12-22 DIAGNOSIS — K1120 Sialoadenitis, unspecified: Secondary | ICD-10-CM | POA: Diagnosis present

## 2015-12-22 DIAGNOSIS — I5042 Chronic combined systolic (congestive) and diastolic (congestive) heart failure: Secondary | ICD-10-CM | POA: Diagnosis present

## 2015-12-22 DIAGNOSIS — I130 Hypertensive heart and chronic kidney disease with heart failure and stage 1 through stage 4 chronic kidney disease, or unspecified chronic kidney disease: Secondary | ICD-10-CM | POA: Diagnosis not present

## 2015-12-22 DIAGNOSIS — K115 Sialolithiasis: Secondary | ICD-10-CM | POA: Diagnosis not present

## 2015-12-22 DIAGNOSIS — I6522 Occlusion and stenosis of left carotid artery: Secondary | ICD-10-CM | POA: Insufficient documentation

## 2015-12-22 HISTORY — DX: Chronic kidney disease, stage 3 (moderate): N18.3

## 2015-12-22 HISTORY — DX: Chronic kidney disease, stage 3 unspecified: N18.30

## 2015-12-22 LAB — CBG MONITORING, ED: Glucose-Capillary: 118 mg/dL — ABNORMAL HIGH (ref 65–99)

## 2015-12-22 LAB — COMPREHENSIVE METABOLIC PANEL
ALBUMIN: 2.8 g/dL — AB (ref 3.5–5.0)
ALK PHOS: 104 U/L (ref 38–126)
ALT: 9 U/L — ABNORMAL LOW (ref 14–54)
ANION GAP: 11 (ref 5–15)
AST: 12 U/L — ABNORMAL LOW (ref 15–41)
BILIRUBIN TOTAL: 0.5 mg/dL (ref 0.3–1.2)
BUN: 27 mg/dL — ABNORMAL HIGH (ref 6–20)
CALCIUM: 9.6 mg/dL (ref 8.9–10.3)
CO2: 23 mmol/L (ref 22–32)
Chloride: 106 mmol/L (ref 101–111)
Creatinine, Ser: 1.61 mg/dL — ABNORMAL HIGH (ref 0.44–1.00)
GFR calc non Af Amer: 27 mL/min — ABNORMAL LOW (ref >60)
GFR, EST AFRICAN AMERICAN: 31 mL/min — AB (ref >60)
GLUCOSE: 118 mg/dL — AB (ref 65–99)
POTASSIUM: 4.2 mmol/L (ref 3.5–5.1)
Sodium: 140 mmol/L (ref 135–145)
TOTAL PROTEIN: 6.3 g/dL — AB (ref 6.5–8.1)

## 2015-12-22 LAB — CBC WITH DIFFERENTIAL/PLATELET
BASOS PCT: 0 %
Basophils Absolute: 0 10*3/uL (ref 0.0–0.1)
EOS ABS: 0.1 10*3/uL (ref 0.0–0.7)
Eosinophils Relative: 1 %
HEMATOCRIT: 34.7 % — AB (ref 36.0–46.0)
HEMOGLOBIN: 11.1 g/dL — AB (ref 12.0–15.0)
LYMPHS ABS: 1 10*3/uL (ref 0.7–4.0)
Lymphocytes Relative: 11 %
MCH: 25.6 pg — AB (ref 26.0–34.0)
MCHC: 32 g/dL (ref 30.0–36.0)
MCV: 80.1 fL (ref 78.0–100.0)
MONO ABS: 1 10*3/uL (ref 0.1–1.0)
MONOS PCT: 11 %
Neutro Abs: 6.8 10*3/uL (ref 1.7–7.7)
Neutrophils Relative %: 77 %
Platelets: 271 10*3/uL (ref 150–400)
RBC: 4.33 MIL/uL (ref 3.87–5.11)
RDW: 17.4 % — AB (ref 11.5–15.5)
WBC: 8.8 10*3/uL (ref 4.0–10.5)

## 2015-12-22 LAB — PROTIME-INR
INR: 1.34 (ref 0.00–1.49)
PROTHROMBIN TIME: 16.7 s — AB (ref 11.6–15.2)

## 2015-12-22 LAB — LACTIC ACID, PLASMA: Lactic Acid, Venous: 0.9 mmol/L (ref 0.5–2.0)

## 2015-12-22 LAB — APTT: APTT: 35 s (ref 24–37)

## 2015-12-22 MED ORDER — MORPHINE SULFATE (PF) 2 MG/ML IV SOLN: 2.0000 mg | INTRAVENOUS | Status: DC | PRN

## 2015-12-22 MED ORDER — ASPIRIN EC 81 MG PO TBEC
81.0000 mg | DELAYED_RELEASE_TABLET | Freq: Every day | ORAL | Status: DC
  Administered 2015-12-23 – 2015-12-24 (×2): 81 mg via ORAL
  Filled 2015-12-22 (×2): qty 1

## 2015-12-22 MED ORDER — HYDRALAZINE HCL 10 MG PO TABS
10.0000 mg | ORAL_TABLET | Freq: Two times a day (BID) | ORAL | Status: DC
  Administered 2015-12-22 – 2015-12-24 (×4): 10 mg via ORAL
  Filled 2015-12-22 (×4): qty 1

## 2015-12-22 MED ORDER — ACETAMINOPHEN 325 MG PO TABS: 650.0000 mg | ORAL_TABLET | Freq: Four times a day (QID) | ORAL | Status: DC | PRN

## 2015-12-22 MED ORDER — IOPAMIDOL (ISOVUE-300) INJECTION 61%
INTRAVENOUS | Status: AC
  Administered 2015-12-22: 75 mL
  Filled 2015-12-22: qty 75

## 2015-12-22 MED ORDER — HYDRALAZINE HCL 20 MG/ML IJ SOLN
5.0000 mg | INTRAMUSCULAR | Status: DC | PRN
  Filled 2015-12-22: qty 1

## 2015-12-22 MED ORDER — ACETAMINOPHEN 325 MG PO TABS: 650.0000 mg | ORAL_TABLET | Freq: Two times a day (BID) | ORAL | Status: DC

## 2015-12-22 MED ORDER — ONDANSETRON HCL 4 MG/2ML IJ SOLN: 4.0000 mg | Freq: Three times a day (TID) | INTRAMUSCULAR | Status: DC | PRN

## 2015-12-22 MED ORDER — CHLORHEXIDINE GLUCONATE 0.12 % MT SOLN
5.0000 mL | Freq: Four times a day (QID) | OROMUCOSAL | Status: DC
  Administered 2015-12-22 – 2015-12-23 (×3): 5 mL via OROMUCOSAL
  Filled 2015-12-22 (×3): qty 15

## 2015-12-22 MED ORDER — POLYVINYL ALCOHOL 1.4 % OP SOLN: 1.0000 [drp] | Freq: Every day | OPHTHALMIC | Status: DC | PRN

## 2015-12-22 MED ORDER — ISOSORBIDE MONONITRATE ER 30 MG PO TB24
30.0000 mg | ORAL_TABLET | Freq: Every day | ORAL | Status: DC
  Administered 2015-12-23 – 2015-12-24 (×2): 30 mg via ORAL
  Filled 2015-12-22 (×2): qty 1

## 2015-12-22 MED ORDER — CLINDAMYCIN PHOSPHATE 900 MG/50ML IV SOLN
900.0000 mg | Freq: Once | INTRAVENOUS | Status: AC
  Administered 2015-12-22: 900 mg via INTRAVENOUS
  Filled 2015-12-22: qty 50

## 2015-12-22 MED ORDER — CLINDAMYCIN PHOSPHATE 600 MG/50ML IV SOLN
600.0000 mg | Freq: Three times a day (TID) | INTRAVENOUS | Status: DC
  Administered 2015-12-23 – 2015-12-24 (×5): 600 mg via INTRAVENOUS
  Filled 2015-12-22 (×5): qty 50

## 2015-12-22 MED ORDER — CARVEDILOL 6.25 MG PO TABS
6.2500 mg | ORAL_TABLET | Freq: Two times a day (BID) | ORAL | Status: DC
  Administered 2015-12-23 – 2015-12-24 (×3): 6.25 mg via ORAL
  Filled 2015-12-22 (×3): qty 1

## 2015-12-22 MED ORDER — POLYETHYLENE GLYCOL 3350 17 G PO PACK: 17.0000 g | PACK | Freq: Every day | ORAL | Status: DC | PRN

## 2015-12-22 MED ORDER — ACETAMINOPHEN 650 MG RE SUPP: 650.0000 mg | Freq: Four times a day (QID) | RECTAL | Status: DC | PRN

## 2015-12-22 MED ORDER — LORAZEPAM 0.5 MG PO TABS
0.5000 mg | ORAL_TABLET | Freq: Three times a day (TID) | ORAL | Status: DC
  Administered 2015-12-22 – 2015-12-24 (×4): 0.5 mg via ORAL
  Filled 2015-12-22 (×4): qty 1

## 2015-12-22 MED ORDER — SODIUM CHLORIDE 0.9% FLUSH
3.0000 mL | Freq: Two times a day (BID) | INTRAVENOUS | Status: DC
  Administered 2015-12-22 – 2015-12-23 (×3): 3 mL via INTRAVENOUS

## 2015-12-22 MED ORDER — FUROSEMIDE 40 MG PO TABS
40.0000 mg | ORAL_TABLET | Freq: Every day | ORAL | Status: DC
  Administered 2015-12-23 – 2015-12-24 (×2): 40 mg via ORAL
  Filled 2015-12-22: qty 2
  Filled 2015-12-22: qty 1

## 2015-12-22 NOTE — ED Notes (Signed)
Attempted to call report. Floor RN unable to accept report.  

## 2015-12-22 NOTE — ED Notes (Signed)
CBG 118 

## 2015-12-22 NOTE — ED Provider Notes (Signed)
CSN: [id number]     Arrival date & time 12/22/15  1811 History   First MD Initiated Contact with Patient 12/22/15 1827     Chief Complaint  Patient presents with  . Weakness      Patient is a 82 y.o. female presenting with weakness. The history is provided by the patient.  Weakness This is a new problem. Pertinent negatives include no chest pain, no abdominal pain and no shortness of breath.  Patient was sent in by Dr. Lazarus Salines after removal of a salivary gland stone with infection on her right jaw. Sent in for IV antibiotics. Patient states she's felt a little weak. She's always had a little bit of swelling in the area but never swollen. Has been much more swollen over the last few days. No fevers or chills. She is under palliative care but would still want hospitalization. No difficulty swallowing.  Past Medical History  Diagnosis Date  . ST elevation myocardial infarction (STEMI) of inferior wall (HCC) 05/2011; 03/10/2012    a) 11/'12: 100% RCA, dLM 80% -- POBA of RCA (for planned CABG, not done until re-admission with NSTEMI 1/'13);; b) 8/'13: 100% SVG-RCA (PCI & re-PCI), native RCA 100%; Patent LIMA-p-mLAD, SVG-OM  . CAD S/P percutaneous coronary angioplasty 11/'12; 8/31 & 9/1/'13    a) MV: 100% RCA-POBA, dLM left main 80%; b) 1/'13: NSTEMI --> CABG; c) 2013: 8/31 - 100% RCA & acute SVG-RCA, 100% SVG-OM, 90% LM, 80% p&mLAD, ~70% RI, 50% Cx --> PCI-SVG-RCA: Promus Element DES x 3 (prox 2.5 mm x 38 mm & 2.5 mm x 16 mm, distal 2.5 mm x 16 mm); on 9/1 - accute in-stent Thrombosis - Aspiration thrombectomy & PTCA  . S/P CABG x 4 08/05/11    Dr. Tyrone Sage: LIMA-p-mLAD, SVG-RPDA, SVG-OM; known  100% SVG-OM, Extensive PTCA of SVG-RCA;;  Hospital course complicated by Afib & PNA; after d/c cellulitis of SVG harvest site due to edema  . Episodic atrial fibrillation (HCC) 07/2011    Post-Op CABG  . Ischemic cardiomyopathy 03/2012; January 2017    a. Echo: EF 45-50% - Mild basal- mid inferolateral  Hypokinesis: Gr 1 DD, mildly increased PAP.;; b. 02/2015: EF 55-60%. Normal wall motion and GR 1 DD.;; c. EF 35-40% (poor) entire inferior wall akinesis. GR 1 DD. Moderate pulmonary hypertension: PAP  ~ 45 mmHg  . Dyslipidemia, goal LDL below 70     statin intolerance (lipitor, crestor drug reaction); Welchol & Zetia  . Hypertension, essential   . DM (diabetes mellitus), type 2 with renal complications (HCC)   . Chronic kidney disease (CKD) stage G3b/A1, moderately decreased glomerular filtration rate (GFR) between 30-44 mL/min/1.73 square meter and albuminuria creatinine ratio less than 30 mg/g   . History of pneumonia     post-op from CABG  . Hypothyroidism     on synthroid  . Headache(784.0)   . Osteoarthritis   . Anxiety     PRN Xanax   . Dermatophytosis of nail   . Gout     on Allopurinol  . Lower extremity edema     chronic  . GERD (gastroesophageal reflux disease) 04/26/2015  . Acute CHF (congestive heart failure) (HCC) 06/24/2015  . Delirium 07/30/2015  . Presence of permanent cardiac pacemaker   . Depressed 08/03/2015   Past Surgical History  Procedure Laterality Date  . Cardiac catheterization  November 2012; August and September 2013    Patent LIMA-LAD, patent she had an OM, patent stented SVG-RCA; occluded native RCA, 90% ostial  left main, tandem 80% proximal and mid LAD, 70% mid Circumflex  . Coronary angioplasty  November 2012    PTCA only of RCA and setting of inferior STEMI  . Coronary artery bypass graft  08/05/2011    Procedure: CORONARY ARTERY BYPASS GRAFTING (CABG);  Surgeon: [name], MD;  Location: [facility] OR;  Service: Open Heart Surgery;  Laterality: N/A;  coronary artery bypass graft times 4 using left internal mammary artery and right leg saphenous vein harvested endoscopically  . Joint replacement      both hips replaced  . I&d extremity  09/21/2011    Procedure: IRRIGATION AND DEBRIDEMENT EXTREMITY;  Surgeon: [name], MD;  Location: [facility] OR;   Service: Vascular;  Laterality: Right;  with wound vac placement  . Coronary angioplasty with stent placement  03/10/2012     infferior STEMI: Occluded native RCA and SVG-RCA --> thrombectomy and 3 stent placement to the SVG-RCA (2.5 mm right 38 mm and 2 proximal distal overlapping 2.5 mm x 16 mm Promus DES stents:  . Coronary angioplasty  03/11/2012    Inferior STEMI #3: Reoccluded SVG-RCA; extensive thrombectomy and post dilation PTCA  . Transthoracic echocardiogram  September 2013    EF 45-50%, mild hypokinesis of the basal and mid inferolateral wall, grade 1 diastolic dysfunction, mildly increased artery pressures.  . Left heart catheterization with coronary angiogram N/A 05/26/2011    Procedure: LEFT HEART CATHETERIZATION WITH CORONARY ANGIOGRAM;  Surgeon: Marykay Lex, MD;  Location: [facility] CATH LAB;  Service: Cardiovascular;  Laterality: N/A;  . Percutaneous coronary intervention-balloon only N/A 05/26/2011    Procedure: PERCUTANEOUS CORONARY INTERVENTION-BALLOON ONLY;  Surgeon: Marykay Lex, MD;  Location: [facility] CATH LAB;  Service: Cardiovascular;  Laterality: N/A;  . Left heart cath N/A 03/10/2012    Procedure: LEFT HEART CATH;  Surgeon: Herby Abraham, MD;  Location: [facility] CATH LAB;  Service: Cardiovascular;  Laterality: N/A;  . Percutaneous coronary stent intervention (pci-s)  03/10/2012    Procedure: PERCUTANEOUS CORONARY STENT INTERVENTION (PCI-S);  Surgeon: Herby Abraham, MD;  Location: [facility] CATH LAB;  Service: Cardiovascular;;  . Left heart catheterization with coronary angiogram Bilateral 03/11/2012    Procedure: LEFT HEART CATHETERIZATION WITH CORONARY ANGIOGRAM;  Surgeon: Thurmon Fair, MD;  Location: [facility] CATH LAB;  Service: Cardiovascular;  Laterality: Bilateral;  . Ep implantable device N/A 02/24/2015    Procedure: Pacemaker Implant;  Surgeon: Thurmon Fair, MD;  Location: [facility] INVASIVE CV LAB;  Service: Cardiovascular;  Laterality: N/A;  . Ep implantable device N/A 04/17/2015     Procedure: Lead Extraction, Can Extraction;  Surgeon: Marinus Maw, MD;  Location: [facility] INVASIVE CV LAB;  Service: Cardiovascular;  Laterality: N/A;  . Cardiac catheterization N/A 04/17/2015    Procedure: Temporary Pacemaker;  Surgeon: Marinus Maw, MD;  Location: [facility] INVASIVE CV LAB;  Service: Cardiovascular;  Laterality: N/A;  . Ep implantable device N/A 04/21/2015    Procedure: Pacemaker Implant;  Surgeon: Marinus Maw, MD;  Location: [facility] INVASIVE CV LAB;  Service: Cardiovascular;  Laterality: N/A;  . Pacemaker lead removal  04/21/2015    Procedure: Pacemaker Lead Removal;  Surgeon: Marinus Maw, MD;  Location: [facility] INVASIVE CV LAB;  Service: Cardiovascular;;  LV   . Transthoracic echocardiogram  January 2017     EF 35-40% (poor) entire inferior wall akinesis. GR 1 DD. Moderate pulmonary hypertension: PAP  ~ 45 mmHg   Family History  Problem Relation Age of Onset  . Breast cancer Mother   .  Heart disease Father    Social History  Substance Use Topics  . Smoking status: Never Smoker   . Smokeless tobacco: Never Used  . Alcohol Use: No   OB History    No data available     Review of Systems  Constitutional: Positive for appetite change and fatigue.  Respiratory: Negative for shortness of breath.   Cardiovascular: Negative for chest pain.  Gastrointestinal: Negative for abdominal pain.  Musculoskeletal: Negative for back pain.  Skin: Positive for wound.  Neurological: Positive for weakness. Negative for speech difficulty.      Allergies  Statins and Tape  Home Medications   Prior to Admission medications   Medication Sig Start Date End Date Taking? Authorizing Provider  acetaminophen (TYLENOL) 325 MG tablet Take 650 mg by mouth 2 (two) times daily.    Historical Provider, MD  aspirin 81 MG tablet Take 1 tablet (81 mg total) by mouth daily. 08/20/15   Mihai Croitoru, MD  carvedilol (COREG) 6.25 MG tablet Take 1 tablet (6.25 mg total) by mouth 2 (two) times daily with a  meal. 08/15/14   Marykay Lex, MD  furosemide (LASIX) 40 MG tablet Take 40 mg by mouth daily. Or as directed by physician    Historical Provider, MD  hydrALAZINE (APRESOLINE) 10 MG tablet Take 1 tablet (10 mg total) by mouth 2 (two) times daily. 08/30/15   Catarina Hartshorn, MD  isosorbide mononitrate (IMDUR) 30 MG 24 hr tablet Take 1 tablet (30 mg total) by mouth daily. 08/30/15   Catarina Hartshorn, MD  LORazepam (ATIVAN) 0.5 MG tablet Take 1 tablet (0.5 mg total) by mouth every 8 (eight) hours. 08/30/15   Catarina Hartshorn, MD  polyethylene glycol [facility] Ethelene Hal) packet Take 17 g by mouth daily as needed for moderate constipation.    Historical Provider, MD  polyvinyl alcohol (LIQUIFILM TEARS) 1.4 % ophthalmic solution Place 1 drop into both eyes as directed.     Historical Provider, MD  potassium chloride (K-DUR) 10 MEQ tablet Take 1 tablet (10 mEq total) by mouth daily. 08/30/15   Catarina Hartshorn, MD   BP 134/60 mmHg  Pulse 67  Temp(Src) 97.5 F (36.4 C) (Oral)  Resp 18  SpO2 97% Physical Exam  Constitutional: She appears well-developed.  HENT:  Head: Atraumatic.  Large right-sided submandibular mass. There is an incision with a Penrose drain.  Eyes: EOM are normal.  Cardiovascular: Normal rate.   Pulmonary/Chest: Effort normal.  Abdominal: Soft.  Musculoskeletal: Normal range of motion.  Neurological: She is alert.  Skin: Skin is warm.    ED Course  Procedures (including critical care time) Labs Review Labs Reviewed  CBC WITH DIFFERENTIAL/PLATELET - Abnormal; Notable for the following:    Hemoglobin 11.1 (*)    HCT 34.7 (*)    MCH 25.6 (*)    RDW 17.4 (*)    All other components within normal limits  COMPREHENSIVE METABOLIC PANEL - Abnormal; Notable for the following:    Glucose, Bld 118 (*)    BUN 27 (*)    Creatinine, Ser 1.61 (*)    Total Protein 6.3 (*)    Albumin 2.8 (*)    AST 12 (*)    ALT 9 (*)    GFR calc non Af Amer 27 (*)    GFR calc Af Amer 31 (*)    All other components within  normal limits  CBG MONITORING, ED - Abnormal; Notable for the following:    Glucose-Capillary 118 (*)  All other components within normal limits    Imaging Review No results found. I have personally reviewed and evaluated these images and lab results as part of my medical decision-making.   EKG Interpretation None      MDM   Final diagnoses:  Infectious sialoadenitis of major salivary gland  Sialolithiasis of submandibular gland    Patient was sent from ENT for admission for IV antibiotics. Recommended clindamycin. Drain his artery been placed. CT scan will be done and Dr. Lazarus Salines with make further plans off of it.    Benjiman Core, MD 12/22/15 2049

## 2015-12-22 NOTE — H&P (Signed)
Patient presents with  . facial abscess    Patient here for evaluation of right facial abscess.   [name] is a 82 y.o. female who presents for facial abscess. Duration of 3-4 weeks. She has been in and out of palliative care for cardiac issues. Her daughter is accompanying her today. No medical therapy thus far. It started out as a small bump and has progressed to a large, firm and tender mass. Just today or yesterday it came to a head but no drainage. She states the back of her tongue on the right side is also sore and swollen. No fever. She is breathing and swallowing without difficulty. She reports having a small bump in her neck that she could feel for many years. It occasionally would flair up but never to this degree. No history of salivary stones. She does report a history of MRSA infection.  Non-smoker.  PMH/Meds/All/SocHx/FamHx/ROS:    PastMedicalHistory   No past medical history on file.     PastSurgicalHistory   No past surgical history on file.    No family history of bleeding disorders, wound healing problems or difficulty with anesthesia.    SocialHistory   Social History        Social History  . Marital status: Widowed    Spouse name: N/A  . Number of children: N/A  . Years of education: N/A      Occupational History  . Not on file.       Social History Main Topics  . Smoking status: Not on file  . Smokeless tobacco: Not on file  . Alcohol use Not on file  . Drug use: Not on file  . Sexual activity: Not on file       Other Topics Concern  . Not on file   Social History Narrative      No current outpatient prescriptions on file.  A complete ROS was performed with pertinent positives/negatives noted in the HPI. The remainder of the ROS are negative.   Physical Exam:    BP 146/55 (Site: Left arm, Position: Sitting)  Ht 1.575 m ( )  Wt 56.7 kg (125 lb)  BMI 22.86 kg/m2   General Awake, at baseline  alertness during examination.  Head/Face Right submandibular area with large, firm and tender 5-6cm abscess with erythema and head. No active drainage.  Eyes No scleral icterus or conjunctival hemorrhage. Globe position appears normal. EOMI.   Right Ear EAC patent, TM intact w/o inflammation. Middle ear well aerated.   Left Ear EAC patent, TM intact w/o inflammation. Middle ear well aerated.   Nose Patent, no polyps or masses seen on anterior rhinoscopy.  Oral cavity No mucosal lesions or tumors seen. Tongue midline. Missing teeth. Good dentition. Right posterior tongue is swollen and tender to palpation. Tenderness and fullness to the posterior right lingual gutter. Torus mandibularis.  Oropharynx Symmetric tonsils.   Neck No abnormal cervical lymphadenopathy. No thyromegaly. No thyroid masses palpated.  Cardio-vascular No cyanosis.  Pulmonary No audible stridor. Breathing easily with no labor.  Neuro Symmetric facial movement.   Psychiatry Appropriate affect and mood for clinic visit.    Independent Review of Additional Tests or Records:  None  Procedures:  Procedure Note - I&D of Right Submandibular Abscess and I&D of right floor of mouth with removal of large sialolith:  The risks/benefits and alternatives were explained. The patient understands and agrees to the procedure.  DETAILS OF PROCEDURE:   Right submandibular abscess sprayed with ethyl chloride  topical anesthetic, followed by local anesthetic with lidocaine 2% and epinephrine 1:100,000. Betadine swab applied to affected area. Waiting five minutes. Using 11 blade scapal, 2cm incision made at the head of the abscess. 10cc of purulent material drained. Using a hemostat, gently dissected into the abscess breaking up any loculations. Inserted penrose drain and secured with one nylon suture. Wrapped neck in gauze.   Bleeding: 4cc  Injected 2% lidocaine and epinephrine 1:100,000 into right floor of mouth. Waited ten minutes and  made a 2cm incision in right floor of mouth. Using hemostat, dissected out a large 2-3 centimeter salivary stone. Patient gargled with saline and hydrogen peroxide.  Bleeding: 3cc  The patient tolerated the procedure well.  No complications.  Impression & Plans:  [name] is a 82 y.o. female with large right submandibular abscess. This was incised and drained with penrose drain insertion. She also underwent incision and drainage of the right floor of mouth with removal of a large submandibular gland stone. Culture of the neck abscess obtained, both aerobic and anaerobic. Recommend patient be admitted to the hospital for IV antibiotics and observation, probably for a few nights. This was coordinated with [facility].  We will plan to see patient back in the office in one week for follow up. Patient and her daughter agree with the plan.  Aquilla Hacker, PA-C  Flo Shanks MD  [facility]

## 2015-12-22 NOTE — ED Notes (Signed)
Per EMS: pt coming from [facility] with c/o weakness. Pt was having a salivary stone removed on right side, pt also had infection drained from same salivary gland. Pt was sent to ED by MD at ENT for eval of increase weakness. Pt A&Ox4, respirations equal and unlabored, skin warm and dry

## 2015-12-22 NOTE — H&P (Signed)
History and Physical    [name]:[id number] DOB: [birth year] DOA: 12/22/2015  Referring MD/NP/PA:   PCP: Bryan Lemma, MD   Patient coming from:  The patient is coming from home.  At baseline, pt is partially dependent for her ADL.  Chief Complaint: Generalized weakness, right facial swelling, mouth pain, right submandible abscess  HPI: [name] is a 82 y.o. female with medical history significant of hypertension, hyperlipidemia, GERD, hypothyroidism, gout, depression, anxiety, CAD, s/p of CABG and stent, pacemaker placement secondary to third degree AV block, combined systolic and diastolic CHF, CKD-[name], who right facial swelling, mouth pain, right submandible abscess  Pt had a salivary stone and large right submandibular abscess. This was incised and drained with penrose drain insertion. She also underwent incision and drainage of the right floor of mouth with removal of a large submandibular gland stone by ENT, Dr. Lazarus Salines today.  She continue to have pain in mouth, right face and submandibular area. The pain is constant, moderate, nonradiating. It is not aggravated or alleviated with any known factors. The right face is swollen. Patient has generalized weakness, but no unilateral weakness or numbness. Patient does not have fever, chills. She denies chest pain, shortness of breath, nausea, vomiting, abdominal pain, diarrhea, symptoms of UTI. Dr. Lazarus Salines recommend patient be admitted to the hospital for IV antibiotics   ED Course: pt was found to have WBC 8.8, temperature normal, no tachycardia, no tachypnea, lactate 0.9, stable renal function. Pt is placed on tele bed for obs.  # Ct-neck soft tissue showed status post recent incision and drainage of RIGHT neck abscess with packing material in place. No residual fluid collection. RIGHT submandibular sialoadenitis and multiple sialoliths. Aberrant RIGHT subclavian artery.  Review of Systems:   General: no fevers,  chills, no changes in body weight, has poor appetite, has fatigue HEENT: no blurry vision, hearing changes or sore throat. Has pain over right face, mouth and right submandibular area. Pulm: no dyspnea, coughing, wheezing CV: no chest pain, no palpitations Abd: no nausea, vomiting, abdominal pain, diarrhea, constipation GU: no dysuria, burning on urination, increased urinary frequency, hematuria  Ext: has trace leg edema Neuro: no unilateral weakness, numbness, or tingling, no vision change or hearing loss Skin: no rash MSK: No muscle spasm, no deformity, no limitation of range of movement in spin Heme: No easy bruising.  Travel history: No recent long distant travel.  Allergy:  Allergies  Allergen Reactions  . Statins Rash  . Tape Rash    Please use paper tape    Past Medical History  Diagnosis Date  . ST elevation myocardial infarction (STEMI) of inferior wall (HCC) 05/2011; 03/10/2012    a) 11/'12: 100% RCA, dLM 80% -- POBA of RCA (for planned CABG, not done until re-admission with NSTEMI 1/'13);; b) 8/'13: 100% SVG-RCA (PCI & re-PCI), native RCA 100%; Patent LIMA-p-mLAD, SVG-OM  . CAD S/P percutaneous coronary angioplasty 11/'12; 8/31 & 9/1/'13    a) MV: 100% RCA-POBA, dLM left main 80%; b) 1/'13: NSTEMI --> CABG; c) 2013: 8/31 - 100% RCA & acute SVG-RCA, 100% SVG-OM, 90% LM, 80% p&mLAD, ~70% RI, 50% Cx --> PCI-SVG-RCA: Promus Element DES x 3 (prox 2.5 mm x 38 mm & 2.5 mm x 16 mm, distal 2.5 mm x 16 mm); on 9/1 - accute in-stent Thrombosis - Aspiration thrombectomy & PTCA  . S/P CABG x 4 08/05/11    Dr. Tyrone Sage: Gerri Lins, SVG-RPDA, SVG-OM; known  100% SVG-OM, Extensive PTCA of SVG-RCA;;  [facility]  course complicated by Afib & PNA; after d/c cellulitis of SVG harvest site due to edema  . Episodic atrial fibrillation (HCC) 07/2011    Post-Op CABG  . Ischemic cardiomyopathy 03/2012; January 2017    a. Echo: EF 45-50% - Mild basal- mid inferolateral Hypokinesis: Gr 1 DD, mildly  increased PAP.;; b. 02/2015: EF 55-60%. Normal wall motion and GR 1 DD.;; c. EF 35-40% (poor) entire inferior wall akinesis. GR 1 DD. Moderate pulmonary hypertension: PAP  ~ 45 mmHg  . Dyslipidemia, goal LDL below 70     statin intolerance (lipitor, crestor drug reaction); Welchol & Zetia  . Hypertension, essential   . DM (diabetes mellitus), type 2 with renal complications (HCC)   . Chronic kidney disease (CKD) stage G3b/A1, moderately decreased glomerular filtration rate (GFR) between 30-44 mL/min/1.73 square meter and albuminuria creatinine ratio less than 30 mg/g   . History of pneumonia     post-op from CABG  . Hypothyroidism     on synthroid  . Headache(784.0)   . Osteoarthritis   . Anxiety     PRN Xanax   . Dermatophytosis of nail   . Gout     on Allopurinol  . Lower extremity edema     chronic  . GERD (gastroesophageal reflux disease) 04/26/2015  . Acute CHF (congestive heart failure) (HCC) 06/24/2015  . Delirium 07/30/2015  . Presence of permanent cardiac pacemaker   . Depressed 08/03/2015  . CKD (chronic kidney disease), stage [name]     Past Surgical History  Procedure Laterality Date  . Cardiac catheterization  November 2012; August and September 2013    Patent LIMA-LAD, patent she had an OM, patent stented SVG-RCA; occluded native RCA, 90% ostial left main, tandem 80% proximal and mid LAD, 70% mid Circumflex  . Coronary angioplasty  November 2012    PTCA only of RCA and setting of inferior STEMI  . Coronary artery bypass graft  08/05/2011    Procedure: CORONARY ARTERY BYPASS GRAFTING (CABG);  Surgeon: [name], MD;  Location: [facility] OR;  Service: Open Heart Surgery;  Laterality: N/A;  coronary artery bypass graft times 4 using left internal mammary artery and right leg saphenous vein harvested endoscopically  . Joint replacement      both hips replaced  . I&d extremity  09/21/2011    Procedure: IRRIGATION AND DEBRIDEMENT EXTREMITY;  Surgeon: [name], MD;   Location: [facility] OR;  Service: Vascular;  Laterality: Right;  with wound vac placement  . Coronary angioplasty with stent placement  03/10/2012     infferior STEMI: Occluded native RCA and SVG-RCA --> thrombectomy and 3 stent placement to the SVG-RCA (2.5 mm right 38 mm and 2 proximal distal overlapping 2.5 mm x 16 mm Promus DES stents:  . Coronary angioplasty  03/11/2012    Inferior STEMI #3: Reoccluded SVG-RCA; extensive thrombectomy and post dilation PTCA  . Transthoracic echocardiogram  September 2013    EF 45-50%, mild hypokinesis of the basal and mid inferolateral wall, grade 1 diastolic dysfunction, mildly increased artery pressures.  . Left heart catheterization with coronary angiogram N/A 05/26/2011    Procedure: LEFT HEART CATHETERIZATION WITH CORONARY ANGIOGRAM;  Surgeon: Marykay Lex, MD;  Location: [facility] CATH LAB;  Service: Cardiovascular;  Laterality: N/A;  . Percutaneous coronary intervention-balloon only N/A 05/26/2011    Procedure: PERCUTANEOUS CORONARY INTERVENTION-BALLOON ONLY;  Surgeon: Marykay Lex, MD;  Location: [facility] CATH LAB;  Service: Cardiovascular;  Laterality: N/A;  . Left heart cath N/A 03/10/2012  Procedure: LEFT HEART CATH;  Surgeon: Herby Abraham, MD;  Location: [facility] CATH LAB;  Service: Cardiovascular;  Laterality: N/A;  . Percutaneous coronary stent intervention (pci-s)  03/10/2012    Procedure: PERCUTANEOUS CORONARY STENT INTERVENTION (PCI-S);  Surgeon: Herby Abraham, MD;  Location: [facility] CATH LAB;  Service: Cardiovascular;;  . Left heart catheterization with coronary angiogram Bilateral 03/11/2012    Procedure: LEFT HEART CATHETERIZATION WITH CORONARY ANGIOGRAM;  Surgeon: Thurmon Fair, MD;  Location: [facility] CATH LAB;  Service: Cardiovascular;  Laterality: Bilateral;  . Ep implantable device N/A 02/24/2015    Procedure: Pacemaker Implant;  Surgeon: Thurmon Fair, MD;  Location: [facility] INVASIVE CV LAB;  Service: Cardiovascular;  Laterality: N/A;  . Ep implantable device N/A  04/17/2015    Procedure: Lead Extraction, Can Extraction;  Surgeon: Marinus Maw, MD;  Location: [facility] INVASIVE CV LAB;  Service: Cardiovascular;  Laterality: N/A;  . Cardiac catheterization N/A 04/17/2015    Procedure: Temporary Pacemaker;  Surgeon: Marinus Maw, MD;  Location: [facility] INVASIVE CV LAB;  Service: Cardiovascular;  Laterality: N/A;  . Ep implantable device N/A 04/21/2015    Procedure: Pacemaker Implant;  Surgeon: Marinus Maw, MD;  Location: [facility] INVASIVE CV LAB;  Service: Cardiovascular;  Laterality: N/A;  . Pacemaker lead removal  04/21/2015    Procedure: Pacemaker Lead Removal;  Surgeon: Marinus Maw, MD;  Location: [facility] INVASIVE CV LAB;  Service: Cardiovascular;;  LV   . Transthoracic echocardiogram  January 2017     EF 35-40% (poor) entire inferior wall akinesis. GR 1 DD. Moderate pulmonary hypertension: PAP  ~ 45 mmHg    Social History:  reports that she has never smoked. She has never used smokeless tobacco. She reports that she does not drink alcohol or use illicit drugs.  Family History:  Family History  Problem Relation Age of Onset  . Breast cancer Mother   . Heart disease Father      Prior to Admission medications   Medication Sig Start Date End Date Taking? Authorizing Provider  acetaminophen (TYLENOL) 325 MG tablet Take 650 mg by mouth 2 (two) times daily.    Historical Provider, MD  aspirin 81 MG tablet Take 1 tablet (81 mg total) by mouth daily. 08/20/15   Mihai Croitoru, MD  carvedilol (COREG) 6.25 MG tablet Take 1 tablet (6.25 mg total) by mouth 2 (two) times daily with a meal. 08/15/14   Marykay Lex, MD  furosemide (LASIX) 40 MG tablet Take 40 mg by mouth daily. Or as directed by physician    Historical Provider, MD  hydrALAZINE (APRESOLINE) 10 MG tablet Take 1 tablet (10 mg total) by mouth 2 (two) times daily. 08/30/15   Catarina Hartshorn, MD  isosorbide mononitrate (IMDUR) 30 MG 24 hr tablet Take 1 tablet (30 mg total) by mouth daily. 08/30/15   Catarina Hartshorn, MD    LORazepam (ATIVAN) 0.5 MG tablet Take 1 tablet (0.5 mg total) by mouth every 8 (eight) hours. 08/30/15   Catarina Hartshorn, MD  polyethylene glycol [facility] Ethelene Hal) packet Take 17 g by mouth daily as needed for moderate constipation.    Historical Provider, MD  polyvinyl alcohol (LIQUIFILM TEARS) 1.4 % ophthalmic solution Place 1 drop into both eyes as directed.     Historical Provider, MD  potassium chloride (K-DUR) 10 MEQ tablet Take 1 tablet (10 mEq total) by mouth daily. 08/30/15   Catarina Hartshorn, MD    Physical Exam: Daiva Eves:   12/22/15 1828 12/22/15 2057 12/22/15 2314 12/23/15  0500  BP: 134/60 113/50 149/71 132/60  Pulse: 67 96 72 73  Temp: 97.5 F (36.4 C)  97.7 F (36.5 C) 98.2 F (36.8 C)  TempSrc: Oral  Oral Oral  Resp: 18 18 18 18   Height:   5\' 2"  (1.575 m)   Weight:   57.2 kg (126 lb 1.7 oz) 57.1 kg (125 lb 14.1 oz)  SpO2: 97% 96% 98% 98%   General: Not in acute distress HEENT: has swelling over right-sided submandibular area, s/p of an incision with a Penrose drain, with severe tenderness.       Eyes: PERRL, EOMI, no scleral icterus.       ENT: No discharge from the ears and nose, no pharynx injection, no tonsillar enlargement.        Neck: No JVD, no bruit, no mass felt. Heme: No neck lymph node enlargement. Cardiac: S1/S2, RRR, No murmurs, No gallops or rubs. Pulm: No rales, wheezing, rhonchi or rubs. Abd: Soft, nondistended, nontender, no rebound pain, no organomegaly, BS present. GU: No hematuria Ext: No pitting leg edema bilaterally. 2+DP/PT pulse bilaterally. Musculoskeletal: No joint deformities, No joint redness or warmth, no limitation of ROM in spin. Skin: No rashes.  Neuro: Alert, oriented X3, cranial nerves II-XII grossly intact, moves all extremities normally.  Psych: Patient is not psychotic, no suicidal or hemocidal ideation.  Labs on Admission: I have personally reviewed following labs and imaging studies  CBC:  Recent Labs Lab 12/22/15 1844  12/23/15 0421  WBC 8.8 7.6  NEUTROABS 6.8  --   HGB 11.1* 10.2*  HCT 34.7* 32.4*  MCV 80.1 80.2  PLT 271 289   Basic Metabolic Panel:  Recent Labs Lab 12/22/15 1844 12/23/15 0421  NA 140 140  K 4.2 4.0  CL 106 106  CO2 23 26  GLUCOSE 118* 94  BUN 27* 26*  CREATININE 1.61* 1.52*  CALCIUM 9.6 9.2   GFR: Estimated Creatinine Clearance: 18.7 mL/min (by C-G formula based on Cr of 1.52). Liver Function Tests:  Recent Labs Lab 12/22/15 1844  AST 12*  ALT 9*  ALKPHOS 104  BILITOT 0.5  PROT 6.3*  ALBUMIN 2.8*   No results for input(s): LIPASE, AMYLASE in the last 168 hours. No results for input(s): AMMONIA in the last 168 hours. Coagulation Profile:  Recent Labs Lab 12/22/15 2137  INR 1.34   Cardiac Enzymes: No results for input(s): CKTOTAL, CKMB, CKMBINDEX, TROPONINI in the last 168 hours. BNP (last 3 results) No results for input(s): PROBNP in the last 8760 hours. HbA1C: No results for input(s): HGBA1C in the last 72 hours. CBG:  Recent Labs Lab 12/22/15 1833  GLUCAP 118*   Lipid Profile: No results for input(s): CHOL, HDL, LDLCALC, TRIG, CHOLHDL, LDLDIRECT in the last 72 hours. Thyroid Function Tests: No results for input(s): TSH, T4TOTAL, FREET4, T3FREE, THYROIDAB in the last 72 hours. Anemia Panel: No results for input(s): VITAMINB12, FOLATE, FERRITIN, TIBC, IRON, RETICCTPCT in the last 72 hours. Urine analysis:    Component Value Date/Time   COLORURINE YELLOW 08/29/2015 0758   APPEARANCEUR CLOUDY* 08/29/2015 0758   LABSPEC 1.016 08/29/2015 0758   PHURINE 5.0 08/29/2015 0758   GLUCOSEU NEGATIVE 08/29/2015 0758   HGBUR MODERATE* 08/29/2015 0758   BILIRUBINUR NEGATIVE 08/29/2015 0758   KETONESUR NEGATIVE 08/29/2015 0758   PROTEINUR NEGATIVE 08/29/2015 0758   UROBILINOGEN 0.2 03/01/2015 2042   NITRITE NEGATIVE 08/29/2015 0758   LEUKOCYTESUR LARGE* 08/29/2015 0758   Sepsis Labs: @LABRCNTIP (procalcitonin:4,lacticidven:4) )No results found for  this or  any previous visit (from the past 240 hour(s)).   Radiological Exams on Admission: Ct Soft Tissue Neck W Contrast  12/22/2015  CLINICAL DATA:  RIGHT-sided neck abscess for years. Status post incision and drainage and removal of a 3 cm RIGHT sialolith. EXAM: CT NECK WITH CONTRAST TECHNIQUE: Multidetector CT imaging of the neck was performed using the standard protocol following the bolus administration of intravenous contrast. CONTRAST:  1 ISOVUE-300 IOPAMIDOL (ISOVUE-300) INJECTION 61% COMPARISON:  CT HEAD August 24, 2015 and CT cervical spine March 01, 2015 FINDINGS: Pharynx and larynx: Mild edema within RIGHT hypopharynx contiguous with the submandibular space. Epiglottis is not enlarged. Airway is widely patent. Normal appearance of the larynx. Salivary glands: Status post consistent drainage of RIGHT neck collection with pack material in place. Multiple RIGHT submandibular sialoliths, measuring up to 8 mm. Interval resection of dominant sialolith seen on prior CT. No ductal dilatation. Multiple sialolith extended into the distribution of RIGHT Wharton's duct. RIGHT submandibular gland is enlarged, and hyperemic without ductal dilatation, or mass. No focal fluid collection. Mildly edematous RIGHT parotid superficial lobe. Thyroid: Normal. Lymph nodes: No lymphadenopathy by CT size criteria though, RIGHT neck inflammation and post surgical change limits evaluation. Vascular: Mild calcific atherosclerosis of LEFT carotid bulb. Aberrant RIGHT subclavian artery coursing posterior to the trachea and esophagus. Limited intracranial: Nonacute. Moderate to severe calcific atherosclerosis of the included carotid siphons and LEFT vertebral artery. Visualized orbits: Normal. Mastoids and visualized paranasal sinuses: Chronic RIGHT maxillary sinusitis, with small LEFT maxillary sinus air-fluid level. Mastoid air cells are well aerated. 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 Skeleton: Severe  C5-6 and C6-7 degenerative discs. Severe LEFT upper cervical facet arthropathy. Status post median sternotomy. Severe RIGHT glenohumeral osteoarthrosis. Upper chest: Cardiac pacemaker RIGHT chest. The heart is likely an enlarged, partially imaged. Mild bronchial wall thickening. Moderate calcific atherosclerosis of the great vessels. IMPRESSION: Status post recent incision and drainage of RIGHT neck abscess with packing material in place. No residual fluid collection. RIGHT submandibular sialoadenitis and multiple sialoliths. Aberrant RIGHT subclavian artery. Electronically Signed   By: Awilda Metro M.D.   On: 12/22/2015 23:29     EKG: Not done in ED, will get one.   Assessment/Plan Principal Problem:   Submandibular abscess Active Problems:   S/P CABG x 3, LIMA - LAD, SVG-OM, SVG- PDA 08/05/11   Ischemic cardiomyopathy, EF 35-40 % 2D January 2017 - reduced from 45-50 in 2013   Atherosclerosis of coronary artery bypass graft with angina pectoris [facility])   Cardiac pacemaker in situ   Chronic combined systolic and diastolic CHF (congestive heart failure) (HCC)   Salivary stone   Generalized weakness   Sialoadenitis of submandibular gland   CKD (chronic kidney disease), stage [name]   Anxiety  Submandibular abscess: s/p of drainage. Still has significant pain. Pt is not septic. Hemodynamically stable. ENT, Dr. Lazarus Salines was consulted by EDP -Will place on telemetry bed for observation -Clindamycin IV -Follow-up blood culture -When necessary Zofran for nausea, morphine for pain -Follow-up ENT recommendation -consult to [facility]  Hx of CAD: s/p of CABG and stent. No CP -continue aspirin, Coreg, Imdur  Chronic combined systolic and diastolic congestive heart failure: 2-D echo on 08/27/15 showed EF of 35-40 percent with grade 1 diastolic dysfunction. The patient is taking Lasix 40 mg daily home. Patient has a trace amount of leg edema, no respiratory distress. No JVD. CHF is compensated on  admission. -Continue Lasix  -Continue aspirin and Coreg -Check BNP  HTN: -Continue home Coreg,  Lasix, hydralazine -IV hydralazine.  CKD-[name]: stable. Baseline creatinine 1.6-1.8. Her creatinine is 1.61 on admission. -Follow-up by BMP  Anxiety: -prn ativan  Generalized weakness: Due to ongoing infection -Treat underlying infectiion -PTOT   DVT ppx: SCD Code Status: DNR Family Communication: Yes, patient's daughter at bed side Disposition Plan:  Anticipate discharge back to previous home environment Consults called:  ENT,  Dr. Lenon Curt Admission status: Obs / tele   Date of Service 12/23/2015    Lorretta Harp [facility] Pager [phone]  If 7PM-7AM, please contact night-coverage [email or web address] Password [facility] 12/23/2015, 7:15 AM

## 2015-12-23 ENCOUNTER — Encounter (HOSPITAL_COMMUNITY): Payer: Self-pay | Admitting: Internal Medicine

## 2015-12-23 DIAGNOSIS — I255 Ischemic cardiomyopathy: Secondary | ICD-10-CM | POA: Diagnosis not present

## 2015-12-23 DIAGNOSIS — N183 Chronic kidney disease, stage 3 unspecified: Secondary | ICD-10-CM | POA: Diagnosis present

## 2015-12-23 DIAGNOSIS — I5042 Chronic combined systolic (congestive) and diastolic (congestive) heart failure: Secondary | ICD-10-CM | POA: Diagnosis not present

## 2015-12-23 DIAGNOSIS — K1120 Sialoadenitis, unspecified: Secondary | ICD-10-CM | POA: Diagnosis not present

## 2015-12-23 DIAGNOSIS — K115 Sialolithiasis: Secondary | ICD-10-CM | POA: Diagnosis not present

## 2015-12-23 DIAGNOSIS — F419 Anxiety disorder, unspecified: Secondary | ICD-10-CM | POA: Diagnosis present

## 2015-12-23 DIAGNOSIS — K122 Cellulitis and abscess of mouth: Secondary | ICD-10-CM | POA: Diagnosis not present

## 2015-12-23 LAB — BASIC METABOLIC PANEL
ANION GAP: 8 (ref 5–15)
BUN: 26 mg/dL — AB (ref 6–20)
CHLORIDE: 106 mmol/L (ref 101–111)
CO2: 26 mmol/L (ref 22–32)
Calcium: 9.2 mg/dL (ref 8.9–10.3)
Creatinine, Ser: 1.52 mg/dL — ABNORMAL HIGH (ref 0.44–1.00)
GFR calc Af Amer: 33 mL/min — ABNORMAL LOW (ref >60)
GFR calc non Af Amer: 29 mL/min — ABNORMAL LOW (ref >60)
Glucose, Bld: 94 mg/dL (ref 65–99)
POTASSIUM: 4 mmol/L (ref 3.5–5.1)
SODIUM: 140 mmol/L (ref 135–145)

## 2015-12-23 LAB — CBC
HEMATOCRIT: 32.4 % — AB (ref 36.0–46.0)
HEMOGLOBIN: 10.2 g/dL — AB (ref 12.0–15.0)
MCH: 25.2 pg — AB (ref 26.0–34.0)
MCHC: 31.5 g/dL (ref 30.0–36.0)
MCV: 80.2 fL (ref 78.0–100.0)
Platelets: 289 10*3/uL (ref 150–400)
RBC: 4.04 MIL/uL (ref 3.87–5.11)
RDW: 17.4 % — ABNORMAL HIGH (ref 11.5–15.5)
WBC: 7.6 10*3/uL (ref 4.0–10.5)

## 2015-12-23 LAB — BRAIN NATRIURETIC PEPTIDE: B NATRIURETIC PEPTIDE 5: 723.8 pg/mL — AB (ref 0.0–100.0)

## 2015-12-23 LAB — MRSA PCR SCREENING: MRSA by PCR: NEGATIVE

## 2015-12-23 NOTE — Care Management Obs Status (Signed)
MEDICARE OBSERVATION STATUS NOTIFICATION   Patient Details  Name: [name] MRN: [id number] Date of Birth: 1920-12-30   Medicare Observation Status Notification Given:  Yes    Cherrie Distance, RN 12/23/2015, 3:35 PM

## 2015-12-23 NOTE — Evaluation (Signed)
Occupational Therapy Evaluation Patient Details Name: [name] MRN: [id number] DOB: 1920-11-13 Today's Date: 12/23/2015    History of Present Illness Pt is a 82 y/o F s/p I&D Rt submandibular abscess and I& D of Rt floor of mouth w/ removal of larg sialolith.  She has been in and out of palliative care for cardiac issues. Pts' PMH includes CKD, depression, delirium, permanent cardiac pacemaker, acute CHF, gout, anxiety.    Clinical Impression   Patient presenting with decreased sit to/stand and decreased endurance. Patient overall min to total assist with ADLs PTA, pt overall supervision for sit to/from stands and functional ambulation/mobility using rollator. Patient currently functioning at an overall min assist level for sit to/from stands and stand pivot transfers (supervision PTA). Pt is at baseline for ADLs.  Patient will benefit from acute OT to increase overall independence in the areas of functional mobility and overall safety in order to safely discharge home with assistance from aides and caregivers.     Follow Up Recommendations  No OT follow up;Supervision/Assistance - 24 hour    Equipment Recommendations  None recommended by OT    Recommendations for Other Services  None at this time  Precautions / Restrictions Precautions Precautions: Fall Restrictions Weight Bearing Restrictions: No      Mobility Bed Mobility Overal bed mobility: Needs Assistance Bed Mobility: Supine to Sit     Supine to sit: Min guard;HOB elevated     General bed mobility comments: Increased time and effort  Transfers Overall transfer level: Needs assistance Equipment used: Rolling walker (2 wheeled) Transfers: Sit to/from [employer] Sit to Stand: Min assist Stand pivot transfers: Min assist       General transfer comment: Min assist to steady when standing and directional cues provided when pt preparing to perform pivot and sit.    Balance Overall balance  assessment: Needs assistance Sitting-balance support: No upper extremity supported;Feet unsupported Sitting balance-Leahy Scale: Fair Sitting balance - Comments: Min guard for safety   Standing balance support: Bilateral upper extremity supported;During functional activity Standing balance-Leahy Scale: Poor Standing balance comment: Relies on UE support    ADL Overall ADL's : At baseline;Needs assistance/impaired  General ADL Comments: Pt at baseline for BADLs. Pt requires min guard to min assist for sit to/from stands and stand pivot toilet transfers. Pt walking to/from BR with supervision from aide PTA.      Vision Additional Comments: poor/low vision - baseline          Pertinent Vitals/Pain Pain Assessment: No/denies pain Pain Score: 8  Pain Location: Rt knee w/ ambulation Pain Descriptors / Indicators: Aching;Grimacing;Moaning Pain Intervention(s): Limited activity within patient's tolerance;Repositioned;Monitored during session     Hand Dominance Right   Extremity/Trunk Assessment Upper Extremity Assessment Upper Extremity Assessment: Generalized weakness   Lower Extremity Assessment Lower Extremity Assessment: Generalized weakness   Cervical / Trunk Assessment Cervical / Trunk Assessment: Kyphotic   Communication Communication Communication: No difficulties   Cognition Arousal/Alertness: Awake/alert Behavior During Therapy: Flat affect Overall Cognitive Status: History of cognitive impairments - at baseline              Home Living Family/patient expects to be discharged to:: Private residence Living Arrangements: Other relatives;Children Available Help at Discharge: Family;Personal care attendant;Available 24 hours/day Type of Home: House Home Access: Ramped entrance     Home Layout: One level     Bathroom Shower/Tub: Producer, television/film/video: Handicapped height     Home Equipment: Cane - single  point;Bedside commode;Grab bars -  tub/shower;Walker - 4 wheels;Transport chair   Additional Comments: Niece at night and on the weekends.  Daughter w/ pt during the day during the week. Additionally, PCA and assist from palliative care.  Therefore, pt has 24/7 assist.      Prior Functioning/Environment Level of Independence: Needs assistance  Gait / Transfers Assistance Needed: Uses cane to assist in bathroom as WC cannot fit in bathroom.  Uses RW in home.  Is pushed in [facility] by family when knees are painful or when she becomes weak (h/o knees buckling) ADL's / Homemaking Assistance Needed: MWF in AM has PCA, TThSu aide for Palliative care for assist w/ bathing and dressing.        OT Diagnosis: Generalized weakness   OT Problem List: Decreased strength;Decreased activity tolerance;Impaired balance (sitting and/or standing);Impaired vision/perception;Decreased safety awareness;Decreased knowledge of use of DME or AE;Decreased knowledge of precautions;Pain   OT Treatment/Interventions: Self-care/ADL training;Therapeutic exercise;DME and/or AE instruction;Therapeutic activities;Patient/family education;Balance training    OT Goals(Current goals can be found in the care plan section) Acute Rehab OT Goals Patient Stated Goal: to go home OT Goal Formulation: With patient/family Time For Goal Achievement: 01/06/16 Potential to Achieve Goals: Good ADL Goals Pt Will Perform Grooming: with supervision;standing Pt Will Transfer to Toilet: with supervision;ambulating;bedside commode;regular height toilet Additional ADL Goal #1: Pt will be supervision with sit to/from stands during ADL to decrease burden of care   OT Frequency: Min 2X/week   Barriers to D/C: none known at this time   End of Session Equipment Utilized During Treatment: Rolling walker  Activity Tolerance: Patient tolerated treatment well Patient left: in chair;with call bell/phone within reach;with chair alarm set;with family/visitor present   Time: 9201-0071 OT  Time Calculation (min): 20 min Charges:  OT General Charges $OT Visit: 1 Procedure OT Evaluation $OT Eval Moderate Complexity: 1 Procedure G-Codes: OT G-codes **NOT FOR INPATIENT CLASS** Functional Limitation: Self care Self Care Current Status (Q1975): At least 20 percent but less than 40 percent impaired, limited or restricted (functional transfers) Self Care Goal Status (O8325): At least 1 percent but less than 20 percent impaired, limited or restricted (functional transfers)  Edwin Cap , MS, OTR/L, CLT Pager: [phone]  12/23/2015, 1:29 PM

## 2015-12-23 NOTE — Evaluation (Signed)
Physical Therapy Evaluation Patient Details Name: [name] MRN: [id number] DOB: September 21, 1920 Today's Date: 12/23/2015   History of Present Illness  Pt is a 82 y/o F s/p I&D Rt submandibular abscess and I& D of Rt floor of mouth w/ removal of larg sialolith.  She has been in and out of palliative care for cardiac issues. Pts' PMH includes CKD, depression, delirium, permanent cardiac pacemaker, acute CHF, gout, anxiety.     Clinical Impression  Pt admitted with above diagnosis. Pt currently with functional limitations due to the deficits listed below (see PT Problem List). [name] presents at her baseline level of functioning.  PTA pt required assist w/ ADLs and ambulation.  She currently requires min assist to stand and for safe short distance ambulation. Pt will benefit from skilled PT to increase their independence and safety with mobility to allow discharge to the venue listed below.      Follow Up Recommendations Home health PT;Supervision/Assistance - 24 hour (Although pt likely not eligible for HHPT, hospice pt)    Equipment Recommendations  None recommended by PT    Recommendations for Other Services       Precautions / Restrictions Precautions Precautions: Fall Restrictions Weight Bearing Restrictions: No      Mobility  Bed Mobility Overal bed mobility: Needs Assistance Bed Mobility: Supine to Sit     Supine to sit: Min guard;HOB elevated     General bed mobility comments: Increased time and effort  Transfers Overall transfer level: Needs assistance Equipment used: Rolling walker (2 wheeled) Transfers: Sit to/from Stand Sit to Stand: Min assist         General transfer comment: Min assist to steady when standing and directional cues provided when pt preparing to sit.  Ambulation/Gait Ambulation/Gait assistance: Min assist Ambulation Distance (Feet): 30 Feet Assistive device: Rolling walker (2 wheeled) Gait Pattern/deviations: Step-through  pattern;Decreased stride length;Trunk flexed   Gait velocity interpretation: Below normal speed for age/gender General Gait Details: Pt w/ flexed posture and requires min assist to maneuver around obstacles in room due to impaired vision.  Pt c/o Rt knee pain.  Stairs            Wheelchair Mobility    Modified Rankin (Stroke Patients Only)       Balance Overall balance assessment: Needs assistance Sitting-balance support: No upper extremity supported;Feet supported Sitting balance-Leahy Scale: Fair Sitting balance - Comments: Min guard for safety   Standing balance support: Bilateral upper extremity supported;During functional activity Standing balance-Leahy Scale: Poor Standing balance comment: Relies on UE support                             Pertinent Vitals/Pain Pain Assessment: 0-10 Pain Score: 8  Pain Location: Rt knee w/ ambulation Pain Descriptors / Indicators: Aching;Grimacing;Moaning Pain Intervention(s): Limited activity within patient's tolerance;Repositioned;Monitored during session    Home Living Family/patient expects to be discharged to:: Private residence Living Arrangements: Other relatives;Children Available Help at Discharge: Family;Personal care attendant;Available 24 hours/day Type of Home: House Home Access: Ramped entrance     Home Layout: One level Home Equipment: Cane - single point;Bedside commode;Grab bars - tub/shower;Walker - 4 wheels;Transport chair Additional Comments: Niece at night and on the weekends.  Daughter w/ pt during the day during the week. Additionally, PCA and assist from palliative care.  Therefore, pt has 24/7 assist.    Prior Function Level of Independence: Needs assistance   Gait / Transfers Assistance Needed:  Uses cane to assist in bathroom as WC cannot fit in bathroom.  Uses RW in home.  Is pushed in [facility] by family when knees are painful or when she becomes weak (h/o knees buckling)  ADL's / Homemaking  Assistance Needed: MWF in AM has PCA, TThSu aide for Palliative care for assist w/ bathing and dressing.        Hand Dominance   Dominant Hand: Right    Extremity/Trunk Assessment   Upper Extremity Assessment: Generalized weakness           Lower Extremity Assessment: Generalized weakness      Cervical / Trunk Assessment: Kyphotic  Communication   Communication: No difficulties  Cognition Arousal/Alertness: Awake/alert Behavior During Therapy: Flat affect Overall Cognitive Status: History of cognitive impairments - at baseline                      General Comments General comments (skin integrity, edema, etc.): Impaired vision, only able to see shadows     Exercises General Exercises - Lower Extremity Ankle Circles/Pumps: AROM;Both;10 reps;Seated (Encouraged pt to perform during the day for knee OA)      Assessment/Plan    PT Assessment Patient needs continued PT services  PT Diagnosis Difficulty walking;Acute pain;Generalized weakness   PT Problem List Decreased strength;Decreased range of motion;Decreased activity tolerance;Decreased balance;Decreased knowledge of use of DME;Decreased cognition;Decreased safety awareness;Pain  PT Treatment Interventions DME instruction;Gait training;Functional mobility training;Therapeutic activities;Therapeutic exercise;Balance training;Cognitive remediation;Patient/family education;Modalities   PT Goals (Current goals can be found in the Care Plan section) Acute Rehab PT Goals Patient Stated Goal: to go home PT Goal Formulation: With patient/family Time For Goal Achievement: 01/06/16 Potential to Achieve Goals: Good    Frequency Min 2X/week   Barriers to discharge        Co-evaluation               End of Session Equipment Utilized During Treatment: Gait belt Activity Tolerance: Patient limited by pain Patient left: in chair;with call bell/phone within reach;with chair alarm set;with family/visitor  present Nurse Communication: Mobility status    Functional Assessment Tool Used: Clinical Judgement Functional Limitation: Mobility: Walking and moving around Mobility: Walking and Moving Around Current Status [phone]): At least 20 percent but less than 40 percent impaired, limited or restricted Mobility: Walking and Moving Around Goal Status [phone]): At least 1 percent but less than 20 percent impaired, limited or restricted    Time: 1111-1136 PT Time Calculation (min) (ACUTE ONLY): 25 min   Charges:   PT Evaluation $PT Eval Low Complexity: 1 Procedure PT Treatments $Therapeutic Activity: 8-22 mins   PT G Codes:   PT G-Codes **NOT FOR INPATIENT CLASS** Functional Assessment Tool Used: Clinical Judgement Functional Limitation: Mobility: Walking and moving around Mobility: Walking and Moving Around Current Status (U9811): At least 20 percent but less than 40 percent impaired, limited or restricted Mobility: Walking and Moving Around Goal Status [phone]): At least 1 percent but less than 20 percent impaired, limited or restricted   Encarnacion Chu PT, DPT  Pager: [phone] Phone: [phone] 12/23/2015, 12:52 PM

## 2015-12-23 NOTE — Consult Note (Addendum)
WOC consult requested for submandibular wound which is post I&D and penrose insertion by the ENT team. This complex wound is beyond [facility] scope of practice; please refer to ENT for further questions regarding post-op dressing changes.  Thank-you, Cammie Mcgee, WOC nurse

## 2015-12-23 NOTE — Progress Notes (Addendum)
[id number] RN Visit  This is a non-related admission from 12/22/15 to [facility] diagnosis of CAD, per Dr. Barbee Shropshire.  Patient is a DNR.  Patient was admitted to hospital s/p incision and drainage of right submandibular sialoadentis for administration of IV antibiotiotics.  Patient seen in room, with daughter at bedside.   Patient resting and appears comfortable, so did not arouse.  Daughter verbalized that the patient had the procedure in the ENT office and experienced quite a bit of pain, so she was pleased to see her resting.  Large, clean, dry and intact bandage noted around neck.  Slight swelling present to Rt jaw.  Patient receiving Clindamycin 600 mg IV Q 8 hours.  She has not required any pain medication today, per chart review.  Cultures pending that were obtained in the office yesterday.  Daughter denied any hospice-related needs or concerns.  [facility] medication list and transfer summary placed on chart.  Please contact for any hospice-related questions or concerns.  Thank you,  Hessie Knows RN, BSN [facility] Liaison [phone]    @ 4:09 PM: Addendum: Per Dr. Barbee Shropshire, this is a related [facility]IP admission.

## 2015-12-23 NOTE — Progress Notes (Signed)
12/23/2015 10:23 AM  [name] [id number]  Hosp  Day 2    Temp:  [97.5 F (36.4 C)-98.2 F (36.8 C)] 98.2 F (36.8 C) (06/14 0500) Pulse Rate:  [67-96] 73 (06/14 0500) Resp:  [18] 18 (06/14 0500) BP: (113-149)/(50-71) 132/60 mmHg (06/14 0500) SpO2:  [96 %-98 %] 98 % (06/14 0500) Weight:  [57.1 kg (125 lb 14.1 oz)-57.2 kg (126 lb 1.7 oz)] 57.1 kg (125 lb 14.1 oz) (06/14 0500),     Intake/Output Summary (Last 24 hours) at 12/23/15 1023 Last data filed at 12/23/15 0827  Gross per 24 hour  Intake      0 ml  Output      0 ml  Net      0 ml    Results for orders placed or performed during the hospital encounter of 12/22/15 (from the past 24 hour(s))  CBG monitoring, ED     Status: Abnormal   Collection Time: 12/22/15  6:33 PM  Result Value Ref Range   Glucose-Capillary 118 (H) 65 - 99 mg/dL  CBC with Differential     Status: Abnormal   Collection Time: 12/22/15  6:44 PM  Result Value Ref Range   WBC 8.8 4.0 - 10.5 K/uL   RBC 4.33 3.87 - 5.11 MIL/uL   Hemoglobin 11.1 (L) 12.0 - 15.0 g/dL   HCT 40.9 (L) 81.1 - 91.4 %   MCV 80.1 78.0 - 100.0 fL   MCH 25.6 (L) 26.0 - 34.0 pg   MCHC 32.0 30.0 - 36.0 g/dL   RDW 78.2 (H) 95.6 - 21.3 %   Platelets 271 150 - 400 K/uL   Neutrophils Relative % 77 %   Neutro Abs 6.8 1.7 - 7.7 K/uL   Lymphocytes Relative 11 %   Lymphs Abs 1.0 0.7 - 4.0 K/uL   Monocytes Relative 11 %   Monocytes Absolute 1.0 0.1 - 1.0 K/uL   Eosinophils Relative 1 %   Eosinophils Absolute 0.1 0.0 - 0.7 K/uL   Basophils Relative 0 %   Basophils Absolute 0.0 0.0 - 0.1 K/uL  Comprehensive metabolic panel     Status: Abnormal   Collection Time: 12/22/15  6:44 PM  Result Value Ref Range   Sodium 140 135 - 145 mmol/L   Potassium 4.2 3.5 - 5.1 mmol/L   Chloride 106 101 - 111 mmol/L   CO2 23 22 - 32 mmol/L   Glucose, Bld 118 (H) 65 - 99 mg/dL   BUN 27 (H) 6 - 20 mg/dL   Creatinine, Ser 0.86 (H) 0.44 - 1.00 mg/dL   Calcium 9.6 8.9 - 57.8 mg/dL   Total  Protein 6.3 (L) 6.5 - 8.1 g/dL   Albumin 2.8 (L) 3.5 - 5.0 g/dL   AST 12 (L) 15 - 41 U/L   ALT 9 (L) 14 - 54 U/L   Alkaline Phosphatase 104 38 - 126 U/L   Total Bilirubin 0.5 0.3 - 1.2 mg/dL   GFR calc non Af Amer 27 (L) >60 mL/min   GFR calc Af Amer 31 (L) >60 mL/min   Anion gap 11 5 - 15  Protime-INR     Status: Abnormal   Collection Time: 12/22/15  9:37 PM  Result Value Ref Range   Prothrombin Time 16.7 (H) 11.6 - 15.2 seconds   INR 1.34 0.00 - 1.49  APTT     Status: None   Collection Time: 12/22/15  9:37 PM  Result Value Ref Range   aPTT 35 24 - 37 seconds  Lactic acid, plasma     Status: None   Collection Time: 12/22/15  9:45 PM  Result Value Ref Range   Lactic Acid, Venous 0.9 0.5 - 2.0 mmol/L  MRSA PCR Screening     Status: None   Collection Time: 12/23/15  2:43 AM  Result Value Ref Range   MRSA by PCR NEGATIVE NEGATIVE  Basic metabolic panel     Status: Abnormal   Collection Time: 12/23/15  4:21 AM  Result Value Ref Range   Sodium 140 135 - 145 mmol/L   Potassium 4.0 3.5 - 5.1 mmol/L   Chloride 106 101 - 111 mmol/L   CO2 26 22 - 32 mmol/L   Glucose, Bld 94 65 - 99 mg/dL   BUN 26 (H) 6 - 20 mg/dL   Creatinine, Ser 1.61 (H) 0.44 - 1.00 mg/dL   Calcium 9.2 8.9 - 09.6 mg/dL   GFR calc non Af Amer 29 (L) >60 mL/min   GFR calc Af Amer 33 (L) >60 mL/min   Anion gap 8 5 - 15  CBC     Status: Abnormal   Collection Time: 12/23/15  4:21 AM  Result Value Ref Range   WBC 7.6 4.0 - 10.5 K/uL   RBC 4.04 3.87 - 5.11 MIL/uL   Hemoglobin 10.2 (L) 12.0 - 15.0 g/dL   HCT 04.5 (L) 40.9 - 81.1 %   MCV 80.2 78.0 - 100.0 fL   MCH 25.2 (L) 26.0 - 34.0 pg   MCHC 31.5 30.0 - 36.0 g/dL   RDW 91.4 (H) 78.2 - 95.6 %   Platelets 289 150 - 400 K/uL  Brain natriuretic peptide     Status: Abnormal   Collection Time: 12/23/15  4:21 AM  Result Value Ref Range   B Natriuretic Peptide 723.8 (H) 0.0 - 100.0 pg/mL   CT neck:  Multiple residual stones in RIGHT submandibular gland and along  duct.  Soft tissue edema but no obvious drainable fluid collections.  Penrose drain in good position.    SUBJECTIVE:  Oral pain better.  Breathing OK.  Npo thus far.  OBJECTIVE:  Mod neck swelling.  Drain in place with mild-mod serosanguineous drainage. Min tongue swelling  IMPRESSION:  RIGHT submandibular sialolithiasis and sialadenitis with neck abscess.  PLAN:  IV antibiosis.  Advance diet and activity.  SCD hose.  In a younger/healthier pt, the treatment plan would be for resolution of acute infection, followed by RIGHT submandibular gland excision.  I am not sure she would tolerate this, but will discuss with Cardiology and with family.  Await cultures taken in office last evening.   Flo Shanks

## 2015-12-23 NOTE — Progress Notes (Signed)
PROGRESS NOTE                                                                                                                                                                                                             Patient Demographics:    [name], is a 82 y.o. female, DOB - 10-Oct-1920, ZOX:[id number]  Admit date - 12/22/2015   Admitting Physician Lorretta Harp, MD  Outpatient Primary MD for the patient is Bryan Lemma, MD  LOS -   Outpatient Specialists: Cardiology  Chief Complaint  Patient presents with  . Weakness       Brief Narrative   82 year old female with history of CAD status post CABG and stent, status post pacemaker for third-degree AV block, combined systolic and diastolic CHF, CK 80 stage [name], hypertension, hyperlipidemia, hypothyroidism, gout, anxiety, depression and GERD presented with right facial swelling with findings of submandibular abscess and salivery  Stone. Patient seen by ENT and underwent incision and drainage of the abscess with a Penrose drain insertion. She also underwent incision with removal of the large submandibular gland stone. Patient admitted further for IV antibiotics.   Subjective:   Patient complains of pain in the floor for mild, improved since admission. Remains afebrile.   Assessment  & Plan :    Principal Problem:   Submandibular abscess Status post drainage. Pain better. No signs of sepsis. Continue empiric IV clindamycin. When necessary morphine for pain. Follow-up with cultures. ENT following. Will discuss with cardiology if patient can tolerate right submandibular gland excision once acute infection has resolved..  Diet advanced.  Active Problems:   S/P CABG x 3, LIMA - LAD, SVG-OM, SVG- PDA 08/05/11 Continue aspirin, Coreg and Imdur    Ischemic cardiomyopathy, EF 35-40 % 2D January 2017 -  Appears euvolemic. Continue home dose Lasix. Resume aspirin and  Coreg.    CKD stage [name] Stable.  Generalized weakness PT evaluation.  Anxiety Continue when necessary Ativan  Essential hypertension Continue home blood pressure medications.   Code Status : DO NOT RESUSCITATE  Family Communication  : None at bedside  Disposition Plan  : Home possibly in the next 48 hours if better on IV antibiotics  Barriers For Discharge : IV antibiotics  Consults  : ENT  Procedures  : I&D of submandibular abscess  DVT Prophylaxis  :  Lovenox -  Lab Results  Component Value Date   PLT 289 12/23/2015    Antibiotics  :    Anti-infectives    Start     Dose/Rate Route Frequency Ordered Stop   12/23/15 0300  clindamycin (CLEOCIN) IVPB 600 mg     600 mg 100 mL/hr over 30 Minutes Intravenous Every 8 hours 12/22/15 2121     12/22/15 1845  clindamycin (CLEOCIN) IVPB 900 mg     900 mg 100 mL/hr over 30 Minutes Intravenous  Once 12/22/15 1840 12/22/15 2005        Objective:   Filed Vitals:   12/22/15 2057 12/22/15 2314 12/23/15 0500 12/23/15 1000  BP: 113/50 149/71 132/60 125/85  Pulse: 96 72 73 70  Temp:  97.7 F (36.5 C) 98.2 F (36.8 C) 97.7 F (36.5 C)  TempSrc:  Oral Oral Oral  Resp: 18 18 18    Height:  5\' 2"  (1.575 m)    Weight:  57.2 kg (126 lb 1.7 oz) 57.1 kg (125 lb 14.1 oz)   SpO2: 96% 98% 98% 97%    Wt Readings from Last 3 Encounters:  12/23/15 57.1 kg (125 lb 14.1 oz)  10/21/15 59.24 kg (130 lb 9.6 oz)  08/30/15 62.5 kg (137 lb 12.6 oz)     Intake/Output Summary (Last 24 hours) at 12/23/15 1347 Last data filed at 12/23/15 1338  Gross per 24 hour  Intake      0 ml  Output    302 ml  Net   -302 ml     Physical Exam  Gen: Elderly thin built female not in distress HEENT:Dressing over right submandibular area with serosanguineous discharge Chest: clear b/l, no added sounds CVS: N S1&S2, no murmurs, rubs or gallop GI: soft, NT, ND, BS+ Musculoskeletal: warm, no edema CNS: Alert and oriented, nonfocal    Data  Review:    CBC  Recent Labs Lab 12/22/15 1844 12/23/15 0421  WBC 8.8 7.6  HGB 11.1* 10.2*  HCT 34.7* 32.4*  PLT 271 289  MCV 80.1 80.2  MCH 25.6* 25.2*  MCHC 32.0 31.5  RDW 17.4* 17.4*  LYMPHSABS 1.0  --   MONOABS 1.0  --   EOSABS 0.1  --   BASOSABS 0.0  --     Chemistries   Recent Labs Lab 12/22/15 1844 12/23/15 0421  NA 140 140  K 4.2 4.0  CL 106 106  CO2 23 26  GLUCOSE 118* 94  BUN 27* 26*  CREATININE 1.61* 1.52*  CALCIUM 9.6 9.2  AST 12*  --   ALT 9*  --   ALKPHOS 104  --   BILITOT 0.5  --    ------------------------------------------------------------------------------------------------------------------ No results for input(s): CHOL, HDL, LDLCALC, TRIG, CHOLHDL, LDLDIRECT in the last 72 hours.  Lab Results  Component Value Date   HGBA1C 5.6 08/25/2015   ------------------------------------------------------------------------------------------------------------------ No results for input(s): TSH, T4TOTAL, T3FREE, THYROIDAB in the last 72 hours.  Invalid input(s): FREET3 ------------------------------------------------------------------------------------------------------------------ No results for input(s): VITAMINB12, FOLATE, FERRITIN, TIBC, IRON, RETICCTPCT in the last 72 hours.  Coagulation profile  Recent Labs Lab 12/22/15 2137  INR 1.34    No results for input(s): DDIMER in the last 72 hours.  Cardiac Enzymes No results for input(s): CKMB, TROPONINI, MYOGLOBIN in the last 168 hours.  Invalid input(s): CK ------------------------------------------------------------------------------------------------------------------    Component Value Date/Time   BNP 723.8* 12/23/2015 0421    Inpatient Medications  Scheduled Meds: . aspirin EC  81 mg Oral Daily  . carvedilol  6.25 mg  Oral BID WC  . chlorhexidine  5 mL Mouth/Throat QID  . clindamycin (CLEOCIN) IV  600 mg Intravenous Q8H  . furosemide  40 mg Oral Daily  . hydrALAZINE  10 mg  Oral BID  . isosorbide mononitrate  30 mg Oral Daily  . LORazepam  0.5 mg Oral Q8H  . sodium chloride flush  3 mL Intravenous Q12H   Continuous Infusions:  PRN Meds:.acetaminophen **OR** acetaminophen, hydrALAZINE, morphine injection, ondansetron, polyethylene glycol, polyvinyl alcohol  Micro Results Recent Results (from the past 240 hour(s))  MRSA PCR Screening     Status: None   Collection Time: 12/23/15  2:43 AM  Result Value Ref Range Status   MRSA by PCR NEGATIVE NEGATIVE Final    Comment:        The GeneXpert MRSA Assay (FDA approved for NASAL specimens only), is one component of a comprehensive MRSA colonization surveillance program. It is not intended to diagnose MRSA infection nor to guide or monitor treatment for MRSA infections.     Radiology Reports Ct Soft Tissue Neck W Contrast  12/22/2015  CLINICAL DATA:  RIGHT-sided neck abscess for years. Status post incision and drainage and removal of a 3 cm RIGHT sialolith. EXAM: CT NECK WITH CONTRAST TECHNIQUE: Multidetector CT imaging of the neck was performed using the standard protocol following the bolus administration of intravenous contrast. CONTRAST:  1 ISOVUE-300 IOPAMIDOL (ISOVUE-300) INJECTION 61% COMPARISON:  CT HEAD August 24, 2015 and CT cervical spine March 01, 2015 FINDINGS: Pharynx and larynx: Mild edema within RIGHT hypopharynx contiguous with the submandibular space. Epiglottis is not enlarged. Airway is widely patent. Normal appearance of the larynx. Salivary glands: Status post consistent drainage of RIGHT neck collection with pack material in place. Multiple RIGHT submandibular sialoliths, measuring up to 8 mm. Interval resection of dominant sialolith seen on prior CT. No ductal dilatation. Multiple sialolith extended into the distribution of RIGHT Wharton's duct. RIGHT submandibular gland is enlarged, and hyperemic without ductal dilatation, or mass. No focal fluid collection. Mildly edematous RIGHT parotid  superficial lobe. Thyroid: Normal. Lymph nodes: No lymphadenopathy by CT size criteria though, RIGHT neck inflammation and post surgical change limits evaluation. Vascular: Mild calcific atherosclerosis of LEFT carotid bulb. Aberrant RIGHT subclavian artery coursing posterior to the trachea and esophagus. Limited intracranial: Nonacute. Moderate to severe calcific atherosclerosis of the included carotid siphons and LEFT vertebral artery. Visualized orbits: Normal. Mastoids and visualized paranasal sinuses: Chronic RIGHT maxillary sinusitis, with small LEFT maxillary sinus air-fluid level. Mastoid air cells are well aerated. 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 Skeleton: Severe C5-6 and C6-7 degenerative discs. Severe LEFT upper cervical facet arthropathy. Status post median sternotomy. Severe RIGHT glenohumeral osteoarthrosis. Upper chest: Cardiac pacemaker RIGHT chest. The heart is likely an enlarged, partially imaged. Mild bronchial wall thickening. Moderate calcific atherosclerosis of the great vessels. IMPRESSION: Status post recent incision and drainage of RIGHT neck abscess with packing material in place. No residual fluid collection. RIGHT submandibular sialoadenitis and multiple sialoliths. Aberrant RIGHT subclavian artery. Electronically Signed   By: Awilda Metro M.D.   On: 12/22/2015 23:29    Time Spent in minutes  25   Eddie North M.D on 12/23/2015 at 1:47 PM  Between 7am to 7pm - Pager - [phone]  After 7pm go to [email or web address] - password [facility]  [facility] -  Office  [phone]

## 2015-12-23 NOTE — Consult Note (Signed)
   [facility] CM Inpatient Consult   12/23/2015  ESMAY TENBRINK Oct 29, 1920 [id number]  Patient screened for potential [facility] Care Management services. Patient is eligible for [facility] Care Management services under patient's [facility] Medicare/ACO Registry plan.Chart review reveals patient is a 82 y.o. female with medical history significant of hypertension, hyperlipidemia, GERD, hypothyroidism, gout, depression, anxiety, CAD, s/p of CABG and stent, pacemaker placement secondary to third degree AV block, combined systolic and diastolic HF, CKD-[name], who right facial swelling, mouth pain, right submandible abscess. Patient was discussed in progression meeting today for being active with [facility] so, therefore, [facility] Care Management services for community follow up will not be appropriate as the patient is receiving full care management with Hospice.  Went to speak with the patient and she was sound asleep this writer did not disturb.  If patient's disposition needs is no longer with hospice care please contact or for questions contact:   Charlesetta Shanks, RN BSN CCM [facility]  [phone] business mobile phone Toll free office [phone]

## 2015-12-23 NOTE — Progress Notes (Signed)
Pt has a lesion to the right labia majora. Pt's daughter states they put neosporin ointment on it at home. Dr. Gonzella Lex has been notified via text page. No response at this time.

## 2015-12-23 NOTE — Progress Notes (Signed)
Patient arrived to 3E28 from [facility]. Alert and oriented X 4. VSS. No complaints of pain. Patient has a drain to right cheek with red purulent drainage. V paced on telemetry. No skin breakdown noted. Patient is a palliative care patient. Patient oriented to call system.

## 2015-12-24 ENCOUNTER — Telehealth: Payer: Self-pay | Admitting: Cardiology

## 2015-12-24 DIAGNOSIS — K115 Sialolithiasis: Secondary | ICD-10-CM | POA: Diagnosis not present

## 2015-12-24 DIAGNOSIS — I255 Ischemic cardiomyopathy: Secondary | ICD-10-CM | POA: Diagnosis not present

## 2015-12-24 DIAGNOSIS — K1120 Sialoadenitis, unspecified: Secondary | ICD-10-CM | POA: Diagnosis not present

## 2015-12-24 DIAGNOSIS — K122 Cellulitis and abscess of mouth: Secondary | ICD-10-CM | POA: Diagnosis not present

## 2015-12-24 LAB — CUP PACEART REMOTE DEVICE CHECK
Battery Remaining Longevity: 113 mo
Brady Statistic AP VP Percent: 66 %
Brady Statistic AP VS Percent: 0 %
Brady Statistic AS VS Percent: 0 %
Implantable Lead Implant Date: 20161011
Implantable Lead Location: 753860
Lead Channel Impedance Value: 540 Ohm
Lead Channel Pacing Threshold Amplitude: 0.5 V
Lead Channel Pacing Threshold Pulse Width: 0.4 ms
Lead Channel Sensing Intrinsic Amplitude: 2.8 mV
Lead Channel Setting Pacing Amplitude: 2 V
Lead Channel Setting Pacing Pulse Width: 0.4 ms
MDC IDC LEAD IMPLANT DT: 20161011
MDC IDC LEAD LOCATION: 753859
MDC IDC MSMT BATTERY IMPEDANCE: 130 Ohm
MDC IDC MSMT BATTERY VOLTAGE: 2.79 V
MDC IDC MSMT LEADCHNL RA IMPEDANCE VALUE: 516 Ohm
MDC IDC MSMT LEADCHNL RA PACING THRESHOLD AMPLITUDE: 0.375 V
MDC IDC MSMT LEADCHNL RV PACING THRESHOLD PULSEWIDTH: 0.4 ms
MDC IDC SESS DTM: 20170508143655
MDC IDC SET LEADCHNL RV PACING AMPLITUDE: 2.5 V
MDC IDC SET LEADCHNL RV SENSING SENSITIVITY: 4 mV
MDC IDC STAT BRADY AS VP PERCENT: 34 %

## 2015-12-24 MED ORDER — BACITRACIN-NEOMYCIN-POLYMYXIN OINTMENT TUBE
TOPICAL_OINTMENT | Freq: Two times a day (BID) | CUTANEOUS | Status: DC
  Administered 2015-12-24: 08:00:00 via TOPICAL
  Filled 2015-12-24: qty 15

## 2015-12-24 MED ORDER — SACCHAROMYCES BOULARDII 250 MG PO CAPS: 250.0000 mg | ORAL_CAPSULE | Freq: Two times a day (BID) | ORAL | Status: AC

## 2015-12-24 MED ORDER — CLINDAMYCIN HCL 300 MG PO CAPS: 600.0000 mg | ORAL_CAPSULE | Freq: Three times a day (TID) | ORAL | Status: AC

## 2015-12-24 MED ORDER — HYDROCODONE-ACETAMINOPHEN 5-325 MG PO TABS: 1.0000 | ORAL_TABLET | Freq: Four times a day (QID) | ORAL | Status: AC | PRN

## 2015-12-24 NOTE — Telephone Encounter (Signed)
I saw the admission & d/c note.  Thanks.  DH

## 2015-12-24 NOTE — Progress Notes (Signed)
D/c instructions discussed with pt and her daughter. All questions answered and they verbalized understanding. Pt's daughter states that Dr. Lazarus Salines gave her instructions on the submandibular abscess dressing change.

## 2015-12-24 NOTE — Telephone Encounter (Signed)
Tried [employer] back, no answer.  Called patient's home number and her daughter Okey Regal answered. She said Renae Fickle was from Hospice and he was just giving [place] an update. No issues to address at this time.

## 2015-12-24 NOTE — Discharge Summary (Signed)
Physician Discharge Summary  KHRYSTYNE ARPIN QMV:[id number] DOB: [birth year] DOA: 12/22/2015  PCP: Bryan Lemma, MD  Admit date: 12/22/2015 Discharge date: 12/24/2015  Admitted From: Home with hospice Disposition:  Home with hospice  Recommendations for Outpatient Follow-up:  1. Follow up with home hospice. Completes ten-day course of antibiotics on 01/01/2016. Please follow with abscess culture results. 2. Follow up with ENT Dr. Lazarus Salines on 6/19  Home Health:no Equipment/Devices:none  Discharge Condition: (Stable/ hospice)  CODE STATUS:DNR Diet recommendation: Heart Healthy / Carb Modified    Discharge Diagnoses:  Principal Problem:   Submandibular abscess   Active Problems:   S/P CABG x 3, LIMA - LAD, SVG-OM, SVG- PDA 08/05/11   Ischemic cardiomyopathy, EF 35-40 % 2D January 2017 - reduced from 45-50 in 2013   Atherosclerosis of coronary artery bypass graft with angina pectoris [facility])   Cardiac pacemaker in situ   Chronic combined systolic and diastolic CHF (congestive heart failure) (HCC)   Salivary stone   Generalized weakness   Sialoadenitis of submandibular gland   CKD (chronic kidney disease), stage [name]   Anxiety  Brief/Interim Summary: 82 year old female with history of CAD status post CABG and stent, status post pacemaker for third-degree AV block, combined systolic and diastolic CHF, CKD stage [name], hypertension, hyperlipidemia, hypothyroidism, gout, anxiety, depression and GERD presented with right facial swelling with findings of submandibular abscess and salivery Stone. Patient seen by ENT and underwent incision and drainage of the abscess with a Penrose drain insertion. She also underwent incision with removal of the large submandibular gland stone. Patient admitted further for IV antibiotics.  Principal Problem:  Submandibular abscess with salivary stone. Status post I&D and penrose drain placed. Pain better. No signs of sepsis. Placed on empiric IV  clindamycin and when necessary morphine for pain. Abscess cultures pending and can be followed up as outpatient. Patient hemodynamically stable and tolerating diet. Will discharge her on oral Vicodin as needed for pain and oral clindamycin to complete total ten-day course of antibiotics. Seen by ENT this morning and removed the Penrose drain. Recommend follow-up in the office on 6/19. Patient is followed actively by home hospice and will arrange home health nurse for follow-up on her wound.  Discussed with ENT. Recommends she would need removal of her submandibular gland in an ideal situation. However given her advanced age, comorbidities and hospice he recommends to see howe she does clincially. Needs daily dressing changes.  Active Problems:  S/P CABG x 3, LIMA - LAD, SVG-OM, SVG- PDA 08/05/11 Continue aspirin, Coreg and Imdur   Ischemic cardiomyopathy, EF 35-40 % 2D January 2017 -  Appears euvolemic. Continue home dose Lasix. Resume aspirin and Coreg.    CKD stage [name] Stable.   Anxiety Continue when necessary Ativan  Essential hypertension Continue home blood pressure medications.     Family Communication : None at bedside   Consults : ENT ( Dr Lazarus Salines)  Procedures : I&D of submandibular abscess  Discharge Instructions     Medication List    TAKE these medications        acetaminophen 325 MG tablet  Commonly known as:  TYLENOL  Take 650 mg by mouth 2 (two) times daily.     aspirin 81 MG tablet  Take 1 tablet (81 mg total) by mouth daily.     carvedilol 6.25 MG tablet  Commonly known as:  COREG  Take 1 tablet (6.25 mg total) by mouth 2 (two) times daily with a meal.     clindamycin 300  MG capsule  Commonly known as:  CLEOCIN  Take 2 capsules (600 mg total) by mouth 3 (three) times daily.     furosemide 40 MG tablet  Commonly known as:  LASIX  Take 40 mg by mouth daily. Or as directed by physician     hydrALAZINE 10 MG tablet  Commonly known as:   APRESOLINE  Take 1 tablet (10 mg total) by mouth 2 (two) times daily.     HYDROcodone-acetaminophen 5-325 MG tablet  Commonly known as:  NORCO/VICODIN  Take 1 tablet by mouth every 6 (six) hours as needed for moderate pain or severe pain.     isosorbide mononitrate 30 MG 24 hr tablet  Commonly known as:  IMDUR  Take 1 tablet (30 mg total) by mouth daily.     LORazepam 0.5 MG tablet  Commonly known as:  ATIVAN  Take 1 tablet (0.5 mg total) by mouth every 8 (eight) hours.     polyethylene glycol packet  Commonly known as:  MIRALAX / GLYCOLAX  Take 17 g by mouth every Monday, Wednesday, and Friday.     polyvinyl alcohol 1.4 % ophthalmic solution  Commonly known as:  LIQUIFILM TEARS  Place 1 drop into both eyes daily as needed for dry eyes.     saccharomyces boulardii 250 MG capsule  Commonly known as:  FLORASTOR  Take 1 capsule (250 mg total) by mouth 2 (two) times daily.           Follow-up Information    Please follow up.   Why:  home hospice     Allergies  Allergen Reactions  . Statins Rash  . Tape Rash    Please use paper tape    Consultations:   Procedures/Studies: Ct Soft Tissue Neck W Contrast  12/22/2015  CLINICAL DATA:  RIGHT-sided neck abscess for years. Status post incision and drainage and removal of a 3 cm RIGHT sialolith. EXAM: CT NECK WITH CONTRAST TECHNIQUE: Multidetector CT imaging of the neck was performed using the standard protocol following the bolus administration of intravenous contrast. CONTRAST:  1 ISOVUE-300 IOPAMIDOL (ISOVUE-300) INJECTION 61% COMPARISON:  CT HEAD August 24, 2015 and CT cervical spine March 01, 2015 FINDINGS: Pharynx and larynx: Mild edema within RIGHT hypopharynx contiguous with the submandibular space. Epiglottis is not enlarged. Airway is widely patent. Normal appearance of the larynx. Salivary glands: Status post consistent drainage of RIGHT neck collection with pack material in place. Multiple RIGHT submandibular  sialoliths, measuring up to 8 mm. Interval resection of dominant sialolith seen on prior CT. No ductal dilatation. Multiple sialolith extended into the distribution of RIGHT Wharton's duct. RIGHT submandibular gland is enlarged, and hyperemic without ductal dilatation, or mass. No focal fluid collection. Mildly edematous RIGHT parotid superficial lobe. Thyroid: Normal. Lymph nodes: No lymphadenopathy by CT size criteria though, RIGHT neck inflammation and post surgical change limits evaluation. Vascular: Mild calcific atherosclerosis of LEFT carotid bulb. Aberrant RIGHT subclavian artery coursing posterior to the trachea and esophagus. Limited intracranial: Nonacute. Moderate to severe calcific atherosclerosis of the included carotid siphons and LEFT vertebral artery. Visualized orbits: Normal. Mastoids and visualized paranasal sinuses: Chronic RIGHT maxillary sinusitis, with small LEFT maxillary sinus air-fluid level. Mastoid air cells are well aerated. 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 Skeleton: Severe C5-6 and C6-7 degenerative discs. Severe LEFT upper cervical facet arthropathy. Status post median sternotomy. Severe RIGHT glenohumeral osteoarthrosis. Upper chest: Cardiac pacemaker RIGHT chest. The heart is likely an enlarged, partially imaged. Mild bronchial wall thickening. Moderate calcific  atherosclerosis of the great vessels. IMPRESSION: Status post recent incision and drainage of RIGHT neck abscess with packing material in place. No residual fluid collection. RIGHT submandibular sialoadenitis and multiple sialoliths. Aberrant RIGHT subclavian artery. Electronically Signed   By: Awilda Metro M.D.   On: 12/22/2015 23:29     Subjective:   Discharge Exam: Filed Vitals:   12/23/15 2035 12/24/15 0800  BP: 111/66 130/60  Pulse: 60   Temp: 98.2 F (36.8 C) 98.2 F (36.8 C)  Resp: 18    Filed Vitals:   12/23/15 1000 12/23/15 1646 12/23/15 2035 12/24/15 0800  BP: 125/85  106/54 111/66 130/60  Pulse: 70 59 60   Temp: 97.7 F (36.5 C) 98.1 F (36.7 C) 98.2 F (36.8 C) 98.2 F (36.8 C)  TempSrc: Oral Oral Oral Oral  Resp:  18 18   Height:      Weight:      SpO2: 97% 99% 100% 98%    Gen: Elderly thin built female not in distress HEENT:Dressing over right submandibular area with no discharge. Mild swelling. Penrose drain removed by ENT Chest: clear b/l, no added sounds CVS: N S1&S2, no murmurs, rubs or gallop GI: soft, NT, ND, BS+ Musculoskeletal: warm, no edema CNS: Alert and oriented, nonfocal   The results of significant diagnostics from this hospitalization (including imaging, microbiology, ancillary and laboratory) are listed below for reference.     Microbiology: Recent Results (from the past 240 hour(s))  Culture, blood (Routine X 2) w Reflex to ID Panel     Status: None (Preliminary result)   Collection Time: 12/22/15  9:32 PM  Result Value Ref Range Status   Specimen Description BLOOD RIGHT ANTECUBITAL  Final   Special Requests BOTTLES DRAWN AEROBIC ONLY 5CC  Final   Culture NO GROWTH < 24 HOURS  Final   Report Status PENDING  Incomplete  Culture, blood (Routine X 2) w Reflex to ID Panel     Status: None (Preliminary result)   Collection Time: 12/22/15  9:37 PM  Result Value Ref Range Status   Specimen Description BLOOD RIGHT HAND  Final   Special Requests BOTTLES DRAWN AEROBIC ONLY 5CC  Final   Culture NO GROWTH < 24 HOURS  Final   Report Status PENDING  Incomplete  MRSA PCR Screening     Status: None   Collection Time: 12/23/15  2:43 AM  Result Value Ref Range Status   MRSA by PCR NEGATIVE NEGATIVE Final    Comment:        The GeneXpert MRSA Assay (FDA approved for NASAL specimens only), is one component of a comprehensive MRSA colonization surveillance program. It is not intended to diagnose MRSA infection nor to guide or monitor treatment for MRSA infections.      Labs: BNP (last 3 results)  Recent Labs   06/24/15 1908 08/24/15 1715 12/23/15 0421  BNP 841.3* 869.5* 723.8*   Basic Metabolic Panel:  Recent Labs Lab 12/22/15 1844 12/23/15 0421  NA 140 140  K 4.2 4.0  CL 106 106  CO2 23 26  GLUCOSE 118* 94  BUN 27* 26*  CREATININE 1.61* 1.52*  CALCIUM 9.6 9.2   Liver Function Tests:  Recent Labs Lab 12/22/15 1844  AST 12*  ALT 9*  ALKPHOS 104  BILITOT 0.5  PROT 6.3*  ALBUMIN 2.8*   No results for input(s): LIPASE, AMYLASE in the last 168 hours. No results for input(s): AMMONIA in the last 168 hours. CBC:  Recent Labs Lab 12/22/15 1844  12/23/15 0421  WBC 8.8 7.6  NEUTROABS 6.8  --   HGB 11.1* 10.2*  HCT 34.7* 32.4*  MCV 80.1 80.2  PLT 271 289   Cardiac Enzymes: No results for input(s): CKTOTAL, CKMB, CKMBINDEX, TROPONINI in the last 168 hours. BNP: Invalid input(s): POCBNP CBG:  Recent Labs Lab 12/22/15 1833  GLUCAP 118*   D-Dimer No results for input(s): DDIMER in the last 72 hours. Hgb A1c No results for input(s): HGBA1C in the last 72 hours. Lipid Profile No results for input(s): CHOL, HDL, LDLCALC, TRIG, CHOLHDL, LDLDIRECT in the last 72 hours. Thyroid function studies No results for input(s): TSH, T4TOTAL, T3FREE, THYROIDAB in the last 72 hours.  Invalid input(s): FREET3 Anemia work up No results for input(s): VITAMINB12, FOLATE, FERRITIN, TIBC, IRON, RETICCTPCT in the last 72 hours. Urinalysis    Component Value Date/Time   COLORURINE YELLOW 08/29/2015 0758   APPEARANCEUR CLOUDY* 08/29/2015 0758   LABSPEC 1.016 08/29/2015 0758   PHURINE 5.0 08/29/2015 0758   GLUCOSEU NEGATIVE 08/29/2015 0758   HGBUR MODERATE* 08/29/2015 0758   BILIRUBINUR NEGATIVE 08/29/2015 0758   KETONESUR NEGATIVE 08/29/2015 0758   PROTEINUR NEGATIVE 08/29/2015 0758   UROBILINOGEN 0.2 03/01/2015 2042   NITRITE NEGATIVE 08/29/2015 0758   LEUKOCYTESUR LARGE* 08/29/2015 0758   Sepsis Labs Invalid input(s): PROCALCITONIN,  WBC,  LACTICIDVEN Microbiology Recent  Results (from the past 240 hour(s))  Culture, blood (Routine X 2) w Reflex to ID Panel     Status: None (Preliminary result)   Collection Time: 12/22/15  9:32 PM  Result Value Ref Range Status   Specimen Description BLOOD RIGHT ANTECUBITAL  Final   Special Requests BOTTLES DRAWN AEROBIC ONLY 5CC  Final   Culture NO GROWTH < 24 HOURS  Final   Report Status PENDING  Incomplete  Culture, blood (Routine X 2) w Reflex to ID Panel     Status: None (Preliminary result)   Collection Time: 12/22/15  9:37 PM  Result Value Ref Range Status   Specimen Description BLOOD RIGHT HAND  Final   Special Requests BOTTLES DRAWN AEROBIC ONLY 5CC  Final   Culture NO GROWTH < 24 HOURS  Final   Report Status PENDING  Incomplete  MRSA PCR Screening     Status: None   Collection Time: 12/23/15  2:43 AM  Result Value Ref Range Status   MRSA by PCR NEGATIVE NEGATIVE Final    Comment:        The GeneXpert MRSA Assay (FDA approved for NASAL specimens only), is one component of a comprehensive MRSA colonization surveillance program. It is not intended to diagnose MRSA infection nor to guide or monitor treatment for MRSA infections.      Time coordinating discharge: Over 30 minutes  SIGNED:   Eddie North, MD  [facility] 12/24/2015, 10:19 AM Pager   If 7PM-7AM, please contact night-coverage [email or web address] Password [email or web address]

## 2015-12-24 NOTE — Telephone Encounter (Signed)
New message    Admit day before yesterday - right jaw - bacterial infection salvia gland   Patient discharge today back home to hospice.

## 2015-12-24 NOTE — Progress Notes (Signed)
[id number] RN Visit  This is a related admission from 12/22/15 to [facility] diagnosis of CAD, per Dr. Barbee Shropshire. Patient is a DNR. Patient was admitted to hospital s/p incision and drainage of right submandibular sialoadentis for administration of IV antibiotiotics.  Patient seen in room, with no family present at time of visit. Patient sitting up in bed with pleasant affect.  She is able to engage in conversation and denied any pain to incision site/Rt jaw area.  She voiced feeling "stiff" in her joints from laying in the bed.  Encourage her to use pain medication if needed and engage in activity as tolerated.  Patient verbalized understanding.  She is hopeful for a discharge today. Large, clean, dry and intact bandage around neck, with minimal serosanguinous breakthrough drainage noted. Slight swelling present to Rt jaw. Patient continues to receive Clindamycin 600 mg IV Q 8 hours. She has not required any pain medication the past 24 hours, per chart review. Cultures remain pending. Patient ate approximately 50% of her dinner tray yesterday evening.  Patient denied any needs or concerns at this time.  [facility] will continue to follow and anticipate any discharge needs.  [facility] medication list and transfer summary placed on chart.  Please contact for any hospice-related questions or concerns.  Thank you,  Hessie Knows RN, BSN [facility] Liaison [phone]

## 2015-12-24 NOTE — Progress Notes (Signed)
12/24/2015 10:40 AM  [name], Ezzie Dural [id number]  Hops Day 3    Temp:  [98.1 F (36.7 C)-98.2 F (36.8 C)] 98.2 F (36.8 C) (06/15 0800) Pulse Rate:  [59-60] 60 (06/14 2035) Resp:  [18] 18 (06/14 2035) BP: (106-130)/(54-66) 130/60 mmHg (06/15 0800) SpO2:  [98 %-100 %] 98 % (06/15 0800),     Intake/Output Summary (Last 24 hours) at 12/24/15 1040 Last data filed at 12/24/15 0929  Gross per 24 hour  Intake    280 ml  Output    452 ml  Net   -172 ml    No results found for this or any previous visit (from the past 24 hour(s)).  SUBJECTIVE:  Less pain. Breathing well. Ambulatory.  Wants to go home.  OBJECTIVE:   Min drainage.  Mod RIGHT neck swelling.  Drain removed.  Dressing replaced.  IMPRESSION:  Satisfactory check  PLAN:  May take 4 weeks or more for swelling to go down.  Will wait and see how she is clinically before addressing the issue of submandibular gland excision.  Await outpatient  Culture results.  Should be OK for discharge home on [place].  Daily dressing change.  Recheck my office Monday please.  Flo Shanks

## 2015-12-26 NOTE — Progress Notes (Signed)
Quick Note:  Blood cultures negative at 4 days. Hopefully they will continue to be negative.  Bryan Lemma, MD  ______

## 2015-12-27 LAB — CULTURE, BLOOD (ROUTINE X 2)
CULTURE: NO GROWTH
Culture: NO GROWTH

## 2016-01-01 ENCOUNTER — Telehealth: Payer: Self-pay | Admitting: Cardiology

## 2016-01-01 NOTE — Telephone Encounter (Signed)
Faxed signed orders regarding changes to treatments/medications to [facility] [place]

## 2016-02-17 ENCOUNTER — Telehealth: Payer: Self-pay | Admitting: Cardiology

## 2016-02-17 ENCOUNTER — Ambulatory Visit (INDEPENDENT_AMBULATORY_CARE_PROVIDER_SITE_OTHER): Admitting: *Deleted

## 2016-02-17 DIAGNOSIS — I442 Atrioventricular block, complete: Secondary | ICD-10-CM | POA: Diagnosis not present

## 2016-02-17 NOTE — Telephone Encounter (Signed)
Confirmed remote transmission w/ pt caregiver.   

## 2016-02-17 NOTE — Progress Notes (Signed)
Remote pacemaker transmission.   

## 2016-02-18 ENCOUNTER — Encounter: Payer: Self-pay | Admitting: Cardiology

## 2016-02-22 LAB — CUP PACEART REMOTE DEVICE CHECK
Battery Impedance: 130 Ohm
Battery Remaining Longevity: 112 mo
Brady Statistic AS VS Percent: 0 %
Implantable Lead Implant Date: 20161011
Implantable Lead Implant Date: 20161011
Implantable Lead Location: 753859
Lead Channel Impedance Value: 516 Ohm
Lead Channel Impedance Value: 517 Ohm
Lead Channel Setting Pacing Amplitude: 2 V
Lead Channel Setting Pacing Amplitude: 2.5 V
Lead Channel Setting Pacing Pulse Width: 0.4 ms
MDC IDC LEAD LOCATION: 753860
MDC IDC MSMT BATTERY VOLTAGE: 2.79 V
MDC IDC MSMT LEADCHNL RA PACING THRESHOLD AMPLITUDE: 0.625 V
MDC IDC MSMT LEADCHNL RA PACING THRESHOLD PULSEWIDTH: 0.4 ms
MDC IDC MSMT LEADCHNL RA SENSING INTR AMPL: 2.8 mV
MDC IDC MSMT LEADCHNL RV PACING THRESHOLD AMPLITUDE: 0.5 V
MDC IDC MSMT LEADCHNL RV PACING THRESHOLD PULSEWIDTH: 0.4 ms
MDC IDC SESS DTM: 20170809143722
MDC IDC SET LEADCHNL RV SENSING SENSITIVITY: 4 mV
MDC IDC STAT BRADY AP VP PERCENT: 66 %
MDC IDC STAT BRADY AP VS PERCENT: 0 %
MDC IDC STAT BRADY AS VP PERCENT: 33 %

## 2016-03-09 ENCOUNTER — Encounter: Payer: Self-pay | Admitting: Cardiology

## 2016-03-09 ENCOUNTER — Ambulatory Visit (INDEPENDENT_AMBULATORY_CARE_PROVIDER_SITE_OTHER): Admitting: Cardiology

## 2016-03-09 VITALS — BP 87/60 | HR 62 | Ht 62.0 in | Wt 128.0 lb

## 2016-03-09 DIAGNOSIS — I110 Hypertensive heart disease with heart failure: Secondary | ICD-10-CM | POA: Diagnosis not present

## 2016-03-09 DIAGNOSIS — I25709 Atherosclerosis of coronary artery bypass graft(s), unspecified, with unspecified angina pectoris: Secondary | ICD-10-CM | POA: Diagnosis not present

## 2016-03-09 DIAGNOSIS — I255 Ischemic cardiomyopathy: Secondary | ICD-10-CM

## 2016-03-09 DIAGNOSIS — Z515 Encounter for palliative care: Secondary | ICD-10-CM

## 2016-03-09 DIAGNOSIS — I442 Atrioventricular block, complete: Secondary | ICD-10-CM

## 2016-03-09 DIAGNOSIS — I2119 ST elevation (STEMI) myocardial infarction involving other coronary artery of inferior wall: Secondary | ICD-10-CM

## 2016-03-09 MED ORDER — CARVEDILOL 3.125 MG PO TABS
3.1250 mg | ORAL_TABLET | Freq: Two times a day (BID) | ORAL | 11 refills | Status: AC
Start: 2016-03-09 — End: 2016-06-07

## 2016-03-09 NOTE — Patient Instructions (Signed)
CHANGE IN MEDICATIONS  STOP  ISOSORBIDE MN STOP HYDRALAZINE STOP CARVEDILOL 6.25 MG TABLET  DECREASE CARVEDILOL  TO 3.125 MG ONE TABLET TWICE A DAY  NO OTHER CHANGES AT CURRENT TIME    Your physician wants you to follow-up in: 12 MONTHS WITH DR HARDING - 30 MINYou will receive a reminder letter in the mail two months in advance. If you don't receive a letter, please call our office to schedule the follow-up appointment.   If you need a refill on your cardiac medications before your next appointment, please call your pharmacy.

## 2016-03-09 NOTE — Progress Notes (Signed)
PCP: Bryan Lemma, MD  Clinic Note: Chief Complaint  Patient presents with  . Follow-up    weakness, cramping in legs.  . Coronary Artery Disease    Palliative care    HPI: [name] is a 82 y.o. female with a PMH below who presents today for Four-month follow-up of her CAD status post several MIs and CABG. Following her last hospitalization in February 2017 for confusion and delirium and likely sepsis, she was discharged on hospice care. Her last visit with me in April was the first follow-up since that discharge. Please see my note from April for full detailed late 2016 in early 2017 history. She initially had a pacemaker placed in August 2016 for high-grade AV block and symptomatically bradycardia was then noted to have a pacemaker pocket infection and after a course of IV antibiotics was taken for a redo pacemaker placement in October. This was in the right side. She subsequently had several hospitalizations for first heart failure and then delirium and then sepsis. She now has had issues with significant delirium with having hallucinations and essentially failure to thrive. Currently on hospice care with most reportedly managed by hospice care. She is DNR/DNI.  Starasia ASHAUNTE STANDLEY was last seen on 10/21/2015. She was apparently eating quite well at that time. The main concern for the family was her delirium. She was on low dose of Lasix  Recent Hospitalizations: He was actually hospitalized for a oral ulcer treated with I&D by ENT.  Studies Reviewed: None since February  Interval History: Mrs. Marut returns today really more weak and less energetic/more lethargic than even her previous visit. She is currently in a wheelchair. Her daughter states that she is very unsteady with walking but is mostly limited by severe right knee pain. She really has not had any anginal symptoms with rest or with the amount of exertion she is able to do. No heart failure symptoms of PND,  orthopnea or significant edema with her current dose of Lasix. She has not noted any rapid irregular heartbeats, palpitations or syncope/near syncope. She has probably had some near falls due to dizziness and loss of balance, but no loss of consciousness.  No melena, hematochezia, hematuria, or epstaxis. No claudication.  She is still not eating very well, and is slowly losing weight. Her daughter isn't sure that she is losing very much weight. She has had some more than usual dizzy spells and hypotension. The home health hospice team was concerned about her antihypertensive agents. For her hallucinations she was started on Haldol and is now more sleepy than usual.  ROS: A comprehensive was performed. Pertinent positives noted above. Review of Systems  Constitutional: Positive for malaise/fatigue and weight loss. Negative for chills and fever.  Eyes: Negative for blurred vision.  Respiratory: Negative for shortness of breath and wheezing.   Cardiovascular: Positive for leg swelling.       Otherwise negative Per history of present illness  Gastrointestinal: Positive for abdominal pain and nausea. Negative for constipation and diarrhea.  Genitourinary: Negative for dysuria.  Musculoskeletal: Positive for joint pain (Mostly her right knee. This really limits any activity. Taking standing dose of Tylenol and occasional Vicodin.).  Neurological: Positive for dizziness and weakness. Negative for focal weakness and loss of consciousness.  Endo/Heme/Allergies: Does not bruise/bleed easily.  Psychiatric/Behavioral: Positive for depression, hallucinations and memory loss.       Now very somnolent    Past Medical History:  Diagnosis Date  . Acute CHF (congestive  heart failure) (HCC) 06/24/2015  . Anxiety    PRN Xanax   . CAD S/P percutaneous coronary angioplasty 11/'12; 8/31 & 9/1/'13   a) MV: 100% RCA-POBA, dLM left main 80%; b) 1/'13: NSTEMI --> CABG; c) 2013: 8/31 - 100% RCA & acute SVG-RCA,  100% SVG-OM, 90% LM, 80% p&mLAD, ~70% RI, 50% Cx --> PCI-SVG-RCA: Promus Element DES x 3 (prox 2.5 mm x 38 mm & 2.5 mm x 16 mm, distal 2.5 mm x 16 mm); on 9/1 - accute in-stent Thrombosis - Aspiration thrombectomy & PTCA  . Chronic kidney disease (CKD) stage G3b/A1, moderately decreased glomerular filtration rate (GFR) between 30-44 mL/min/1.73 square meter and albuminuria creatinine ratio less than 30 mg/g   . CKD (chronic kidney disease), stage [name]   . Delirium 07/30/2015  . Depressed 08/03/2015  . Dermatophytosis of nail   . DM (diabetes mellitus), type 2 with renal complications (HCC)   . Dyslipidemia, goal LDL below 70    statin intolerance (lipitor, crestor drug reaction); Welchol & Zetia  . Episodic atrial fibrillation (HCC) 07/2011   Post-Op CABG  . GERD (gastroesophageal reflux disease) 04/26/2015  . Gout    on Allopurinol  . Headache(784.0)   . History of pneumonia    post-op from CABG  . Hypertension, essential   . Hypothyroidism    on synthroid  . Ischemic cardiomyopathy 03/2012; January 2017   a. Echo: EF 45-50% - Mild basal- mid inferolateral Hypokinesis: Gr 1 DD, mildly increased PAP.;; b. 02/2015: EF 55-60%. Normal wall motion and GR 1 DD.;; c. EF 35-40% (poor) entire inferior wall akinesis. GR 1 DD. Moderate pulmonary hypertension: PAP  ~ 45 mmHg  . Lower extremity edema    chronic  . Osteoarthritis   . Presence of permanent cardiac pacemaker   . S/P CABG x 4 08/05/11   Dr. Tyrone SageGerhardt: LIMA-p-mLAD, SVG-RPDA, SVG-OM; known  100% SVG-OM, Extensive PTCA of SVG-RCA;;  Hospital course complicated by Afib & PNA; after d/c cellulitis of SVG harvest site due to edema  . ST elevation myocardial infarction (STEMI) of inferior wall (HCC) 05/2011; 03/10/2012   a) 11/'12: 100% RCA, dLM 80% -- POBA of RCA (for planned CABG, not done until re-admission with NSTEMI 1/'13);; b) 8/'13: 100% SVG-RCA (PCI & re-PCI), native RCA 100%; Patent LIMA-p-mLAD, SVG-OM    Past Surgical History:  Procedure  Laterality Date  . CARDIAC CATHETERIZATION  November 2012; August and September 2013   Patent LIMA-LAD, patent she had an OM, patent stented SVG-RCA; occluded native RCA, 90% ostial left main, tandem 80% proximal and mid LAD, 70% mid Circumflex  . CARDIAC CATHETERIZATION N/A 04/17/2015   Procedure: Temporary Pacemaker;  Surgeon: Marinus MawGregg W Taylor, MD;  [facility];  Service: Cardiovascular;  Laterality: N/A;  . CORONARY ANGIOPLASTY  November 2012   PTCA only of RCA and setting of inferior STEMI  . CORONARY ANGIOPLASTY  03/11/2012   Inferior STEMI #3: Reoccluded SVG-RCA; extensive thrombectomy and post dilation PTCA  . CORONARY ANGIOPLASTY WITH STENT PLACEMENT  03/10/2012    infferior STEMI: Occluded native RCA and SVG-RCA --> thrombectomy and 3 stent placement to the SVG-RCA (2.5 mm right 38 mm and 2 proximal distal overlapping 2.5 mm x 16 mm Promus DES stents:  . CORONARY ARTERY BYPASS GRAFT  08/05/2011   Procedure: CORONARY ARTERY BYPASS GRAFTING (CABG);  Surgeon: Delight OvensEdward B Gerhardt, MD;  [facility];  Service: Open Heart Surgery;  Laterality: N/A;  coronary artery bypass graft times 4 using left  internal mammary artery and right leg saphenous vein harvested endoscopically  . EP IMPLANTABLE DEVICE N/A 02/24/2015   Procedure: Pacemaker Implant;  Surgeon: Thurmon Fair, MD;  Location: [facility] INVASIVE CV LAB;  Service: Cardiovascular;  Laterality: N/A;  . EP IMPLANTABLE DEVICE N/A 04/17/2015   Procedure: Lead Extraction, Can Extraction;  Surgeon: Marinus Maw, MD;  Location: [facility] INVASIVE CV LAB;  Service: Cardiovascular;  Laterality: N/A;  . EP IMPLANTABLE DEVICE N/A 04/21/2015   Procedure: Pacemaker Implant;  Surgeon: Marinus Maw, MD;  Location: [facility] INVASIVE CV LAB;  Service: Cardiovascular;  Laterality: N/A;  . I&D EXTREMITY  09/21/2011   Procedure: IRRIGATION AND DEBRIDEMENT EXTREMITY;  Surgeon: [name], MD;  Location[facility] OR;  Service: Vascular;  Laterality: Right;  with  wound vac placement  . JOINT REPLACEMENT     both hips replaced  . LEFT HEART CATH N/A 03/10/2012   Procedure: LEFT HEART CATH;  Surgeon: Herby Abraham, MD;  Location: [facility] CATH LAB;  Service: Cardiovascular;  Laterality: N/A;  . LEFT HEART CATHETERIZATION WITH CORONARY ANGIOGRAM N/A 05/26/2011   Procedure: LEFT HEART CATHETERIZATION WITH CORONARY ANGIOGRAM;  Surgeon: Marykay Lex, MD;  Location: [facility] CATH LAB;  Service: Cardiovascular;  Laterality: N/A;  . LEFT HEART CATHETERIZATION WITH CORONARY ANGIOGRAM Bilateral 03/11/2012   Procedure: LEFT HEART CATHETERIZATION WITH CORONARY ANGIOGRAM;  Surgeon: Thurmon Fair, MD;  Location: [facility] CATH LAB;  Service: Cardiovascular;  Laterality: Bilateral;  . PACEMAKER LEAD REMOVAL  04/21/2015   Procedure: Pacemaker Lead Removal;  Surgeon: Marinus Maw, MD;  Location: [facility] INVASIVE CV LAB;  Service: Cardiovascular;;  LV   . PERCUTANEOUS CORONARY INTERVENTION-BALLOON ONLY N/A 05/26/2011   Procedure: PERCUTANEOUS CORONARY INTERVENTION-BALLOON ONLY;  Surgeon: Marykay Lex, MD;  Location: [facility] CATH LAB;  Service: Cardiovascular;  Laterality: N/A;  . PERCUTANEOUS CORONARY STENT INTERVENTION (PCI-S)  03/10/2012   Procedure: PERCUTANEOUS CORONARY STENT INTERVENTION (PCI-S);  Surgeon: Herby Abraham, MD;  Location: [facility] CATH LAB;  Service: Cardiovascular;;  . TRANSTHORACIC ECHOCARDIOGRAM  September 2013   EF 45-50%, mild hypokinesis of the basal and mid inferolateral wall, grade 1 diastolic dysfunction, mildly increased artery pressures.  Krystal Clark ECHOCARDIOGRAM  January 2017    EF 35-40% (poor) entire inferior wall akinesis. GR 1 DD. Moderate pulmonary hypertension: PAP  ~ 45 mmHg    Prior to Admission medications   Medication Sig Start Date End Date Taking? Authorizing Provider  acetaminophen (TYLENOL) 325 MG tablet Take 650 mg by mouth 2 (two) times daily.   Yes Historical Provider, MD  aspirin 81 MG tablet Take 1 tablet (81 mg total) by mouth daily.  08/20/15  Yes Mihai Croitoru, MD  furosemide (LASIX) 40 MG tablet Take 40 mg by mouth daily. Or as directed by physician   Yes Historical Provider, MD  haloperidol (HALDOL) 5 MG tablet TAKE 1 TABLET EVERY 4 HOURS AS NEEDED FOR HALLUCINATIONS 03/02/16  Yes Historical Provider, MD  HYDROcodone-acetaminophen (NORCO/VICODIN) 5-325 MG tablet Take 1 tablet by mouth every 6 (six) hours as needed for moderate pain or severe pain. 12/24/15  Yes Nishant Dhungel, MD  LORazepam (ATIVAN) 0.5 MG tablet Take 1 tablet (0.5 mg total) by mouth every 8 (eight) hours. Patient taking differently: Take 0.5 mg by mouth every 6 (six) hours as needed for anxiety.  08/30/15  Yes Catarina Hartshorn, MD  polyethylene glycol [facility] Ethelene Hal) packet Take 17 g by mouth every Monday, Wednesday, and Friday.    Yes Historical Provider, MD  polyvinyl alcohol (  LIQUIFILM TEARS) 1.4 % ophthalmic solution Place 1 drop into both eyes daily as needed for dry eyes.    Yes Historical Provider, MD  saccharomyces boulardii (FLORASTOR) 250 MG capsule Take 1 capsule (250 mg total) by mouth 2 (two) times daily. 12/24/15  Yes Nishant Dhungel, MD  carvedilol (COREG) 3.125 MG tablet Take 1 tablet (3.125 mg total) by mouth 2 (two) times daily. 03/09/16 06/07/16  Marykay Lex, MD   Allergies  Allergen Reactions  . Statins Rash  . Tape Rash    Please use paper tape     Social History   Social History  . Marital status: Widowed    Spouse name: N/A  . Number of children: N/A  . Years of education: N/A   Social History Main Topics  . Smoking status: Never Smoker  . Smokeless tobacco: Never Used  . Alcohol use No  . Drug use: No  . Sexual activity: No   Other Topics Concern  . 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 She is very active up and around the house, does not do routine exercise.   Does not smoke or drink.     Family History  Problem Relation Age of Onset  . Breast cancer Mother   . Heart disease Father      Wt Readings from Last 3 Encounters:  03/09/16 128 lb (58.1 kg)  12/23/15 125 lb 14.1 oz (57.1 kg)  10/21/15 130 lb 9.6 oz (59.2 kg)    PHYSICAL EXAM BP (!) 87/60   Pulse 62   Ht 5\' 2"  (1.575 m)   Wt 128 lb (58.1 kg) Comment: UNABLE TO STAND  BMI 23.41 kg/m  General appearance: alert, cooperative, appears stated age, no distress.  Actually well-nourished and well-groomed appearing.  Neck: no adenopathy, no carotid bruit and no JVD Lungs: clear to auscultation bilaterally, normal percussion bilaterally and non-labored Heart: regular rate and rhythm, S1 & S2 normal, no murmur, click, rub or gallop; Non-displaced PMI Abdomen: soft, non-tender; bowel sounds normal; no masses,  no organomegaly; no HJR Extremities: extremities normal, atraumatic, no cyanosis, and edema ~1+ bilaterally Pulses: 2+ and symmetric; Skin: mobility and turgor normal, no evidence of bleeding or bruising, no lesions noted and temperature normal or  Neurologic: Mental status: Alert, oriented to place and time.  -- she is very quiet and reserved. Doesn't answer questions openly. But she does admit to having had some hallucination episodes. Does seem to be confused today.  Cranial nerves: normal (II-XII grossly intact)   Adult ECG Report \\not  checked   Other studies Reviewed: Additional studies/ records that were reviewed today include:  Recent Labs:  n/a     ASSESSMENT / PLAN: Problem List Items Addressed This Visit    STEMI, 03/10/12 Rx'd with PCI with recurrance 03/11/12 - redo PCI of SVG-RCA (Chronic)    Back to back to back MIs. One STEMI 1 non-STEMI and mother STEMI. Thankfully no further episodes. No plans for further invasive evaluation or even noninvasive evaluation.      Relevant Medications   carvedilol (COREG) 3.125 MG tablet   Palliative care encounter - Primary    Spent a long time  again talking about goals of care. Certainly there are times when hospitalization is important for instance the need for ENT to implants that her oral ulcer. Otherwise we're trying to avoid and minimize hospitalizations  or even doctor visits. Scaling back medications to allow for more blood pressure and use of care. Confirm DNR/DNI. Confirmed no plans for further noninvasive or invasive cardiac evaluation besides following her pacemaker. Probably would not opt for pacemaker replacement.      Ischemic cardiomyopathy, EF 35-40 % 2D January 2017 - reduced from 45-50 in 2013 (Chronic)    Actually no true heart failure symptoms. Trace edema. Only taking 40 mg Lasix most of times a day but sometimes taking an additional dose for edema. We are discontinuing the hydralazine nitrate combination for afterload reduction to allow for better blood pressure. Also producing carvedilol by one half.      Relevant Medications   carvedilol (COREG) 3.125 MG tablet   Hypertensive heart disease with CHF (congestive heart failure) (HCC) (Chronic)    Now hypotensive. Reducing blood pressure medications.      Relevant Medications   carvedilol (COREG) 3.125 MG tablet   AV block, 3rd degree (HCC)    Status post pacemaker. She will continue to have her scans done to ensure that his functioning properly. No signs of infection at this point.      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 No further heart failure symptoms. His point time with palliative care being the main focus, I think we can start pulling off some medications to help improve quality of life. She's now having hypotension episodes. Plan is to reduce carvedilol to 3.125 mg twice a day, DC Imdur and hydralazine  No plans for further hospitalizations, noninvasive or invasive studies. Palliative management of heart failure and  angina. If she has anginal symptoms we would restart Imdur.      Relevant Medications   carvedilol (COREG) 3.125 MG tablet    Other Visit Diagnoses   None.    <no information>   Given the sensitive nature of this visit, close to 45 minutes was spent in direct consultation with the patient and her daughter. We discussed goals of care and medication adjustments. Over 75% of the time was spent in discussion.   Current medicines are reviewed at length with the patient today. (+/- concerns) Very dizzy. Hypotensive. The following changes have been made:  CHANGE IN MEDICATIONS  STOP  ISOSORBIDE MN STOP HYDRALAZINE STOP CARVEDILOL 6.25 MG TABLET  DECREASE CARVEDILOL  TO 3.125 MG ONE TABLET TWICE A DAY  NO OTHER CHANGES AT CURRENT TIME    Your physician wants you to follow-up in: 12 MONTHS WITH DR HARDING - 30 MIN   Studies Ordered:   No orders of the defined types were placed in this encounter.     Bryan Lemma, M.D., M.S. Interventional Cardiologist   Pager # [phone] Phone # [phone] [address] [place], [place] [address]

## 2016-03-11 ENCOUNTER — Encounter: Payer: Self-pay | Admitting: Cardiology

## 2016-03-11 NOTE — Assessment & Plan Note (Signed)
Status post pacemaker. She will continue to have her scans done to ensure that his functioning properly. No signs of infection at this point.

## 2016-03-11 NOTE — Assessment & Plan Note (Signed)
Actually no true heart failure symptoms. Trace edema. Only taking 40 mg Lasix most of times a day but sometimes taking an additional dose for edema. We are discontinuing the hydralazine nitrate combination for afterload reduction to allow for better blood pressure. Also producing carvedilol by one half.

## 2016-03-11 NOTE — Assessment & Plan Note (Signed)
No further angina. No further heart failure symptoms. His point time with palliative care being the main focus, I think we can start pulling off some medications to help improve quality of life. She's now having hypotension episodes. Plan is to reduce carvedilol to 3.125 mg twice a day, DC Imdur and hydralazine  No plans for further hospitalizations, noninvasive or invasive studies. Palliative management of heart failure and angina. If she has anginal symptoms we would restart Imdur.

## 2016-03-11 NOTE — Assessment & Plan Note (Signed)
Spent a long time again talking about goals of care. Certainly there are times when hospitalization is important for instance the need for ENT to implants that her oral ulcer. Otherwise we're trying to avoid and minimize hospitalizations or even doctor visits. Scaling back medications to allow for more blood pressure and use of care. Confirm DNR/DNI. Confirmed no plans for further noninvasive or invasive cardiac evaluation besides following her pacemaker. Probably would not opt for pacemaker replacement.

## 2016-03-11 NOTE — Assessment & Plan Note (Signed)
Back to back to back MIs. One STEMI 1 non-STEMI and mother STEMI. Thankfully no further episodes. No plans for further invasive evaluation or even noninvasive evaluation.

## 2016-03-11 NOTE — Assessment & Plan Note (Signed)
Now hypotensive. Reducing blood pressure medications.

## 2016-03-21 ENCOUNTER — Telehealth: Payer: Self-pay | Admitting: Cardiology

## 2016-03-21 NOTE — Telephone Encounter (Signed)
03/21/2016 Received death certificate on [name] for Dr. Herbie Baltimore to sign so it was give to him.. cbr

## 2016-04-10 DEATH — deceased

## 2016-05-18 ENCOUNTER — Encounter

## 2017-07-23 IMAGING — DX DG CHEST 2V
2 series · 2 of 2 positions shown · non-contrast
Comparison: 02/25/2015 and earlier.

CLINICAL DATA: Acute onset of chest pain earlier today. Patient had
a fall yesterday and was floor for 4 hr prior to EMS arrival, and
since vital signs were stable, she was not transported to the
emergency department. Prior CABG. Indwelling pacemaker placed 5 days
ago.

EXAM:
CHEST  2 VIEW

[chest lat]
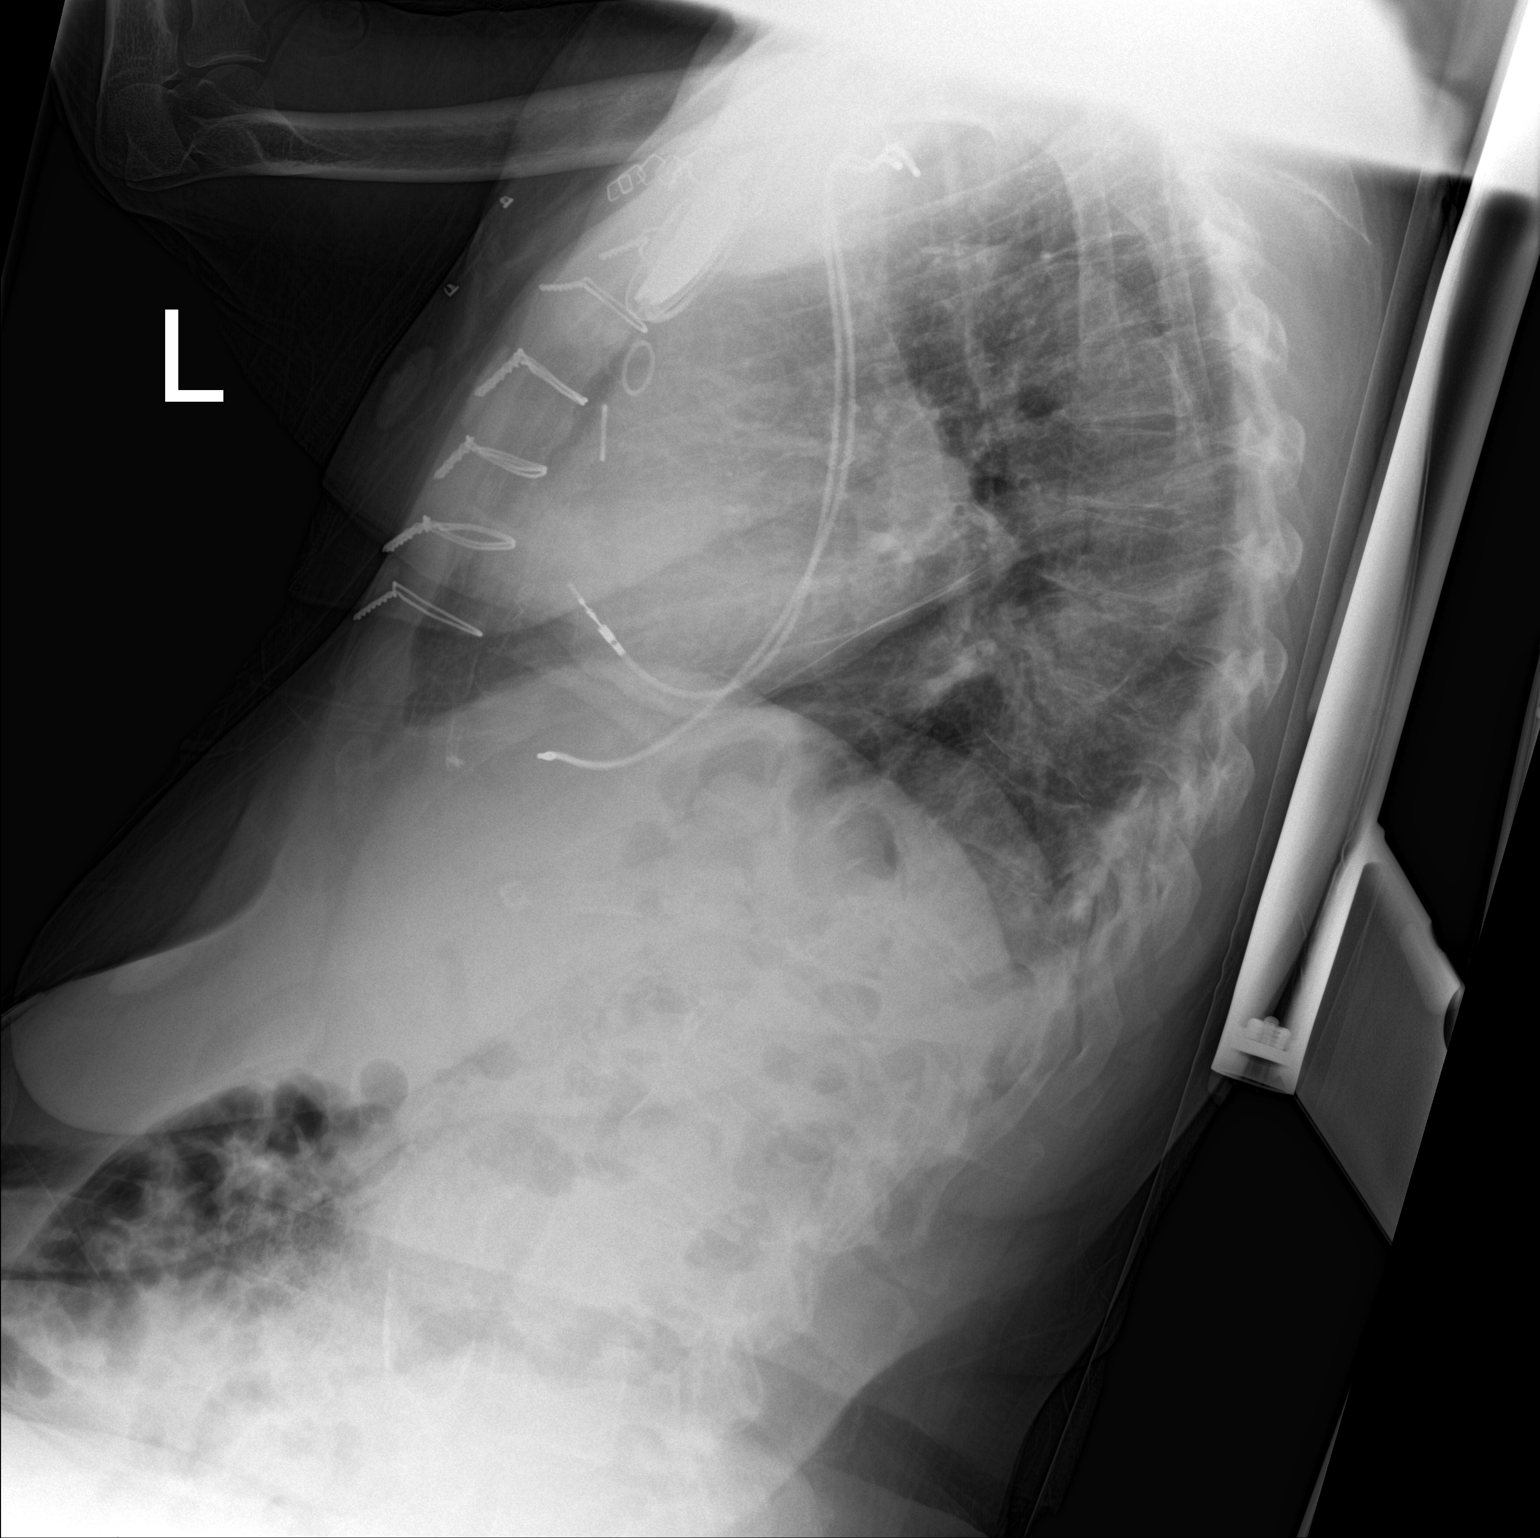

[chest ap]
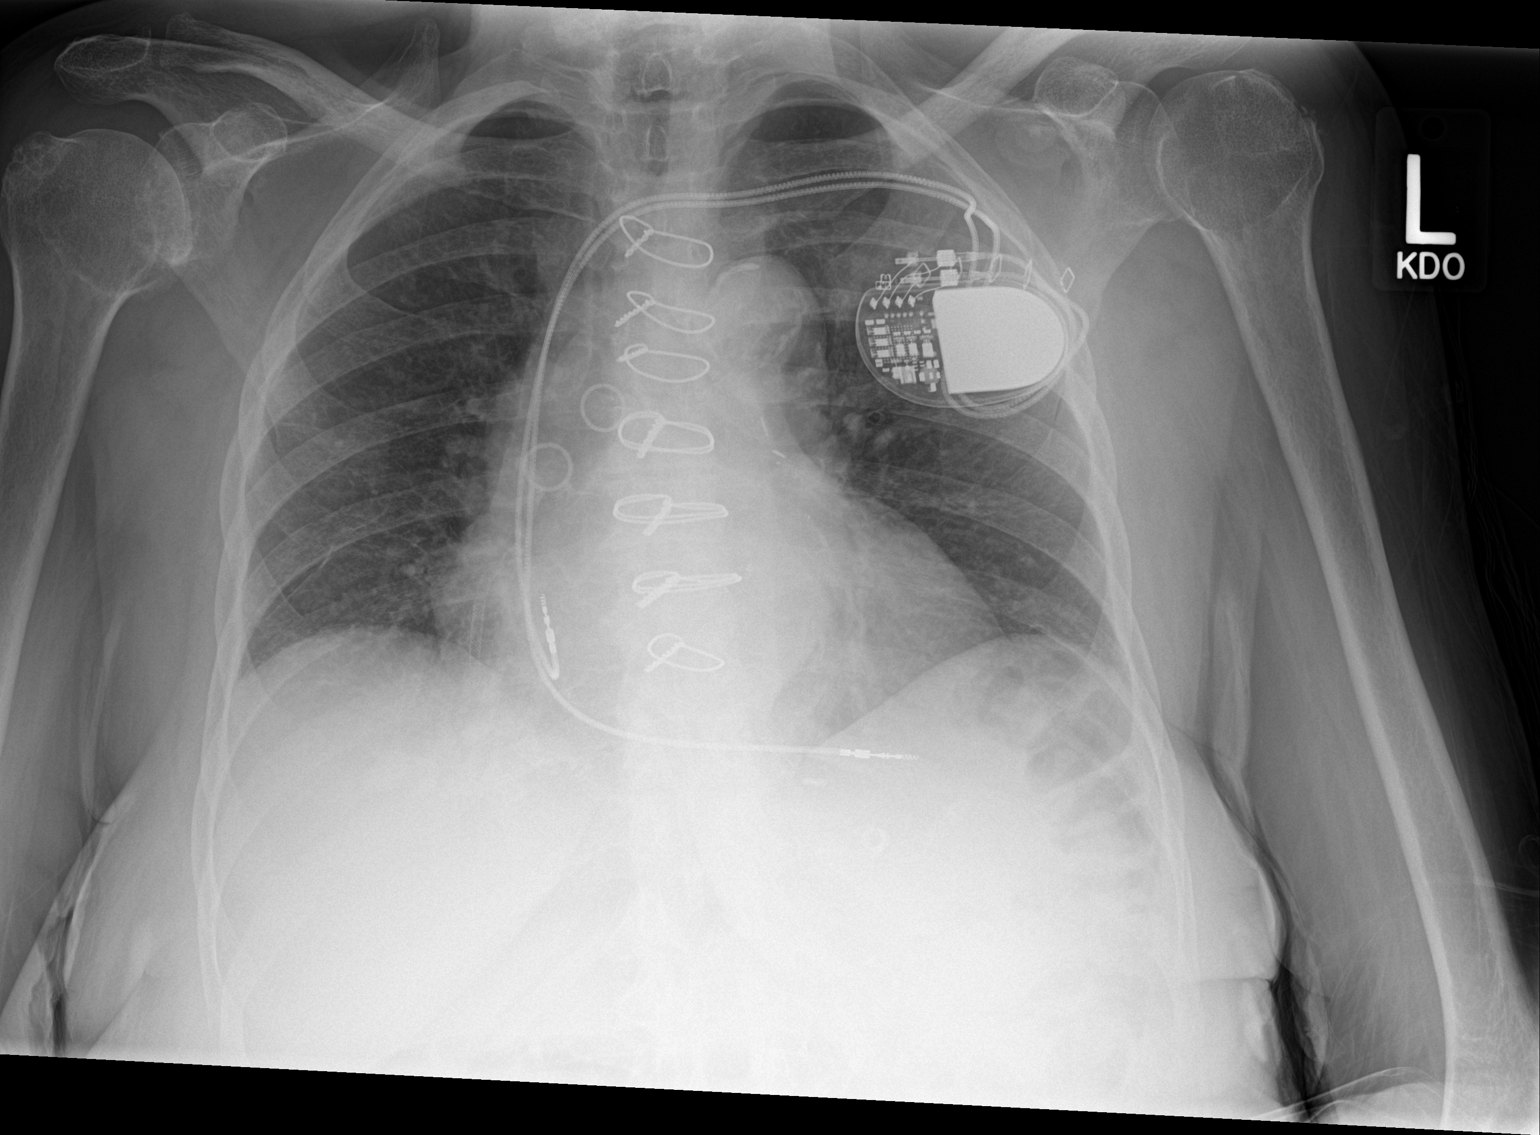

[2 of 2 positions shown; findings below may reference images not displayed]

FINDINGS: Sternotomy for CABG. Cardiac silhouette moderately enlarged,
unchanged. Thoracic aorta atherosclerotic and mildly tortuous,
unchanged. Hilar and mediastinal contours otherwise unremarkable.
Left subclavian dual lead transvenous pacemaker with the lead tips
at the expected location of the right atrial appendage and RV apex,
unchanged. Stable linear scarring in the right middle lobe. Lungs
otherwise clear. No localized airspace consolidation. No pleural
effusions. No pneumothorax. Normal pulmonary vascularity.
Degenerative changes involving the thoracic spine. Right coronary
artery stent noted.
IMPRESSION: Stable moderate cardiomegaly without evidence of pulmonary edema.
Stable scarring in the right middle lobe. No acute cardiopulmonary
disease.

## 2017-07-23 IMAGING — DX DG HAND COMPLETE 3+V*L*
3 series · 3 of 3 positions shown · non-contrast
Comparison: 02/11/2010.

CLINICAL DATA: [AGE] who fell earlier today and injured the
left hand. Initial encounter.

EXAM:
LEFT HAND - COMPLETE 3+ VIEW

[hand pa]
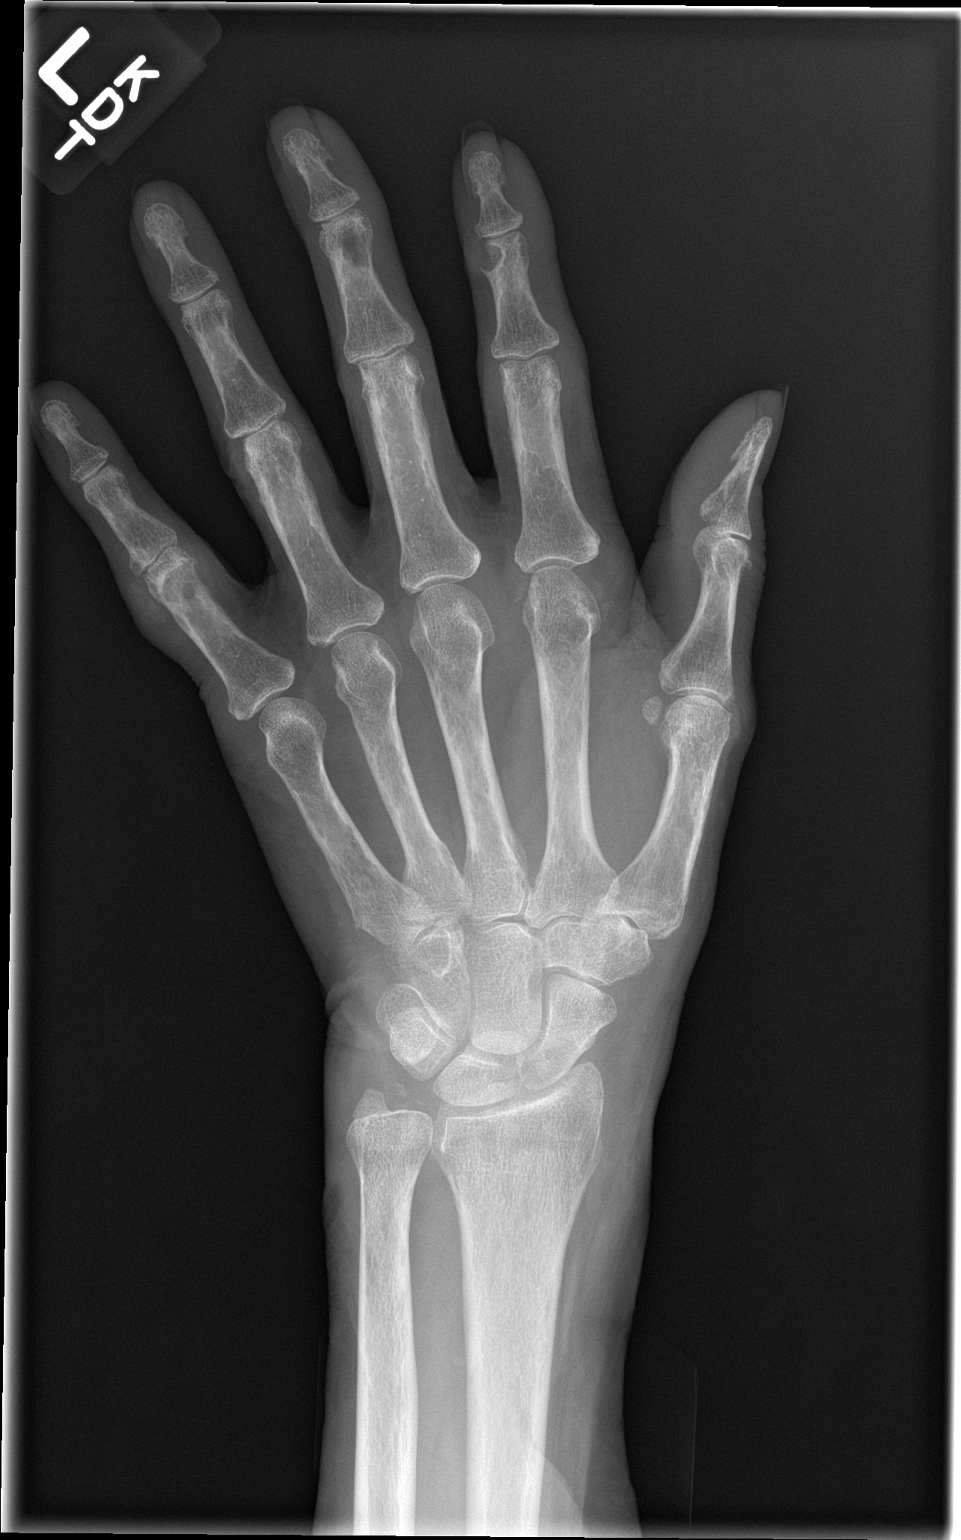

[hand obl]
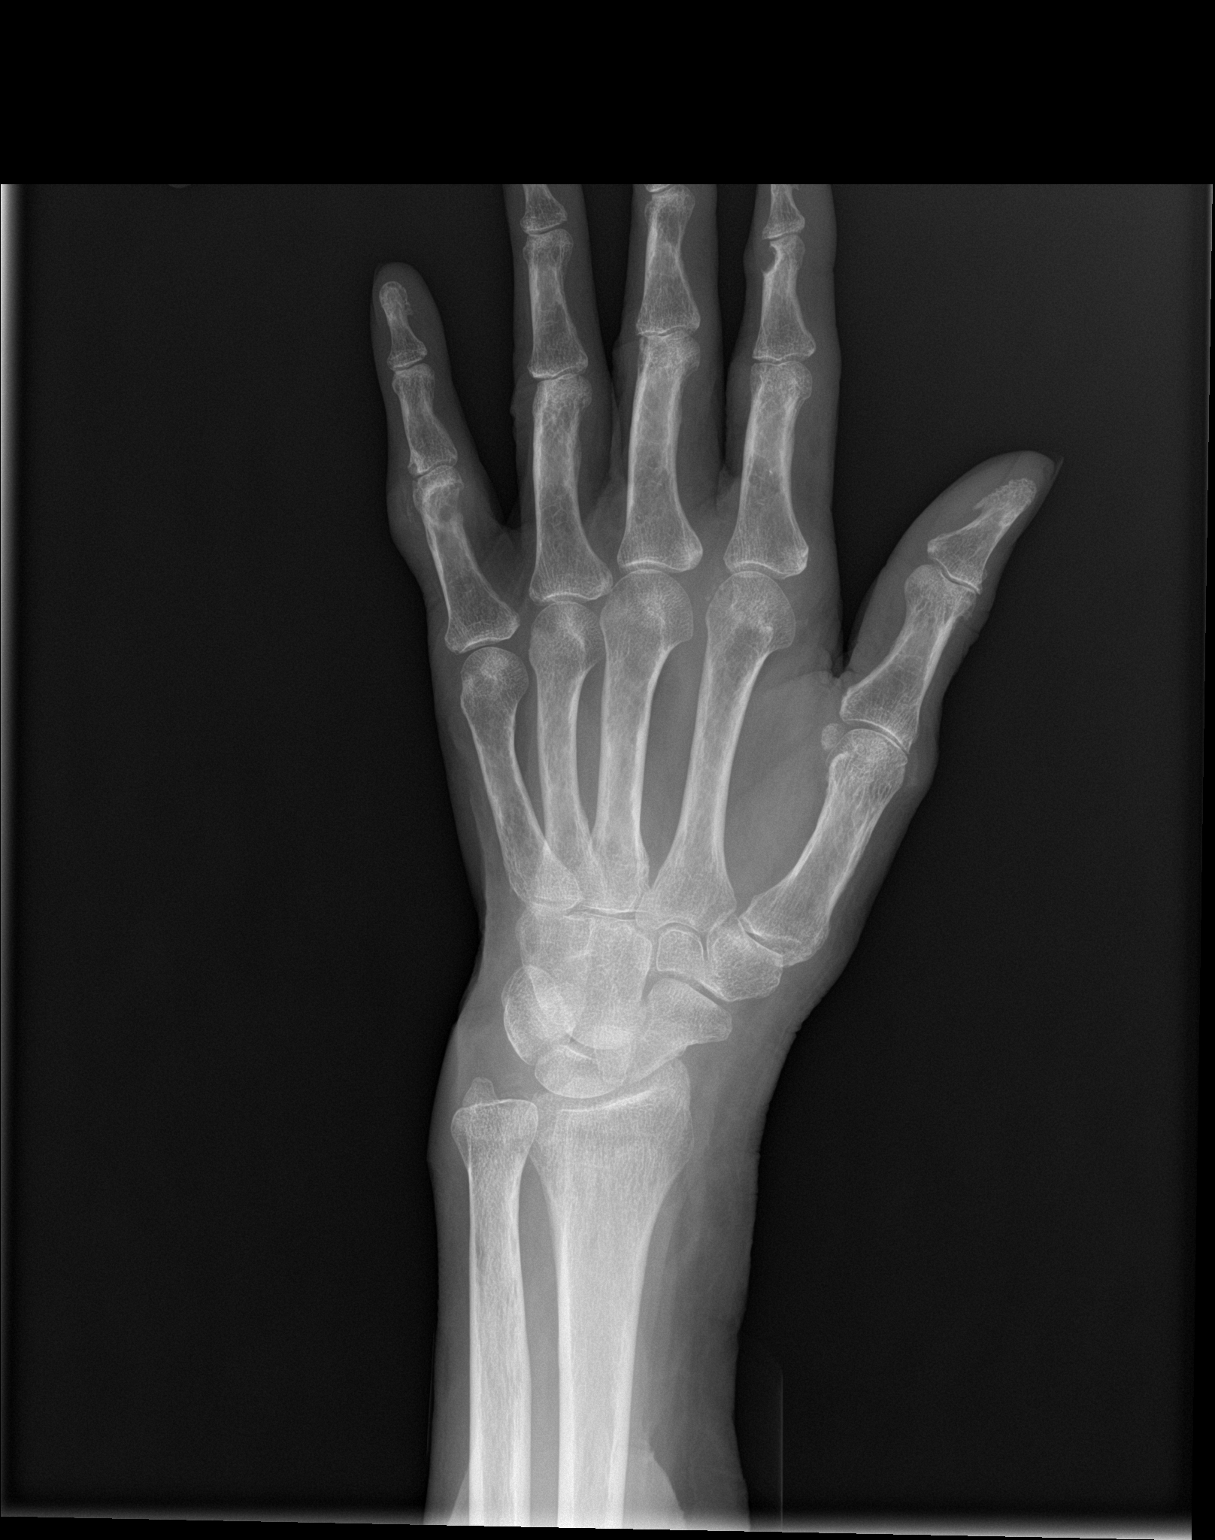

[hand lat]
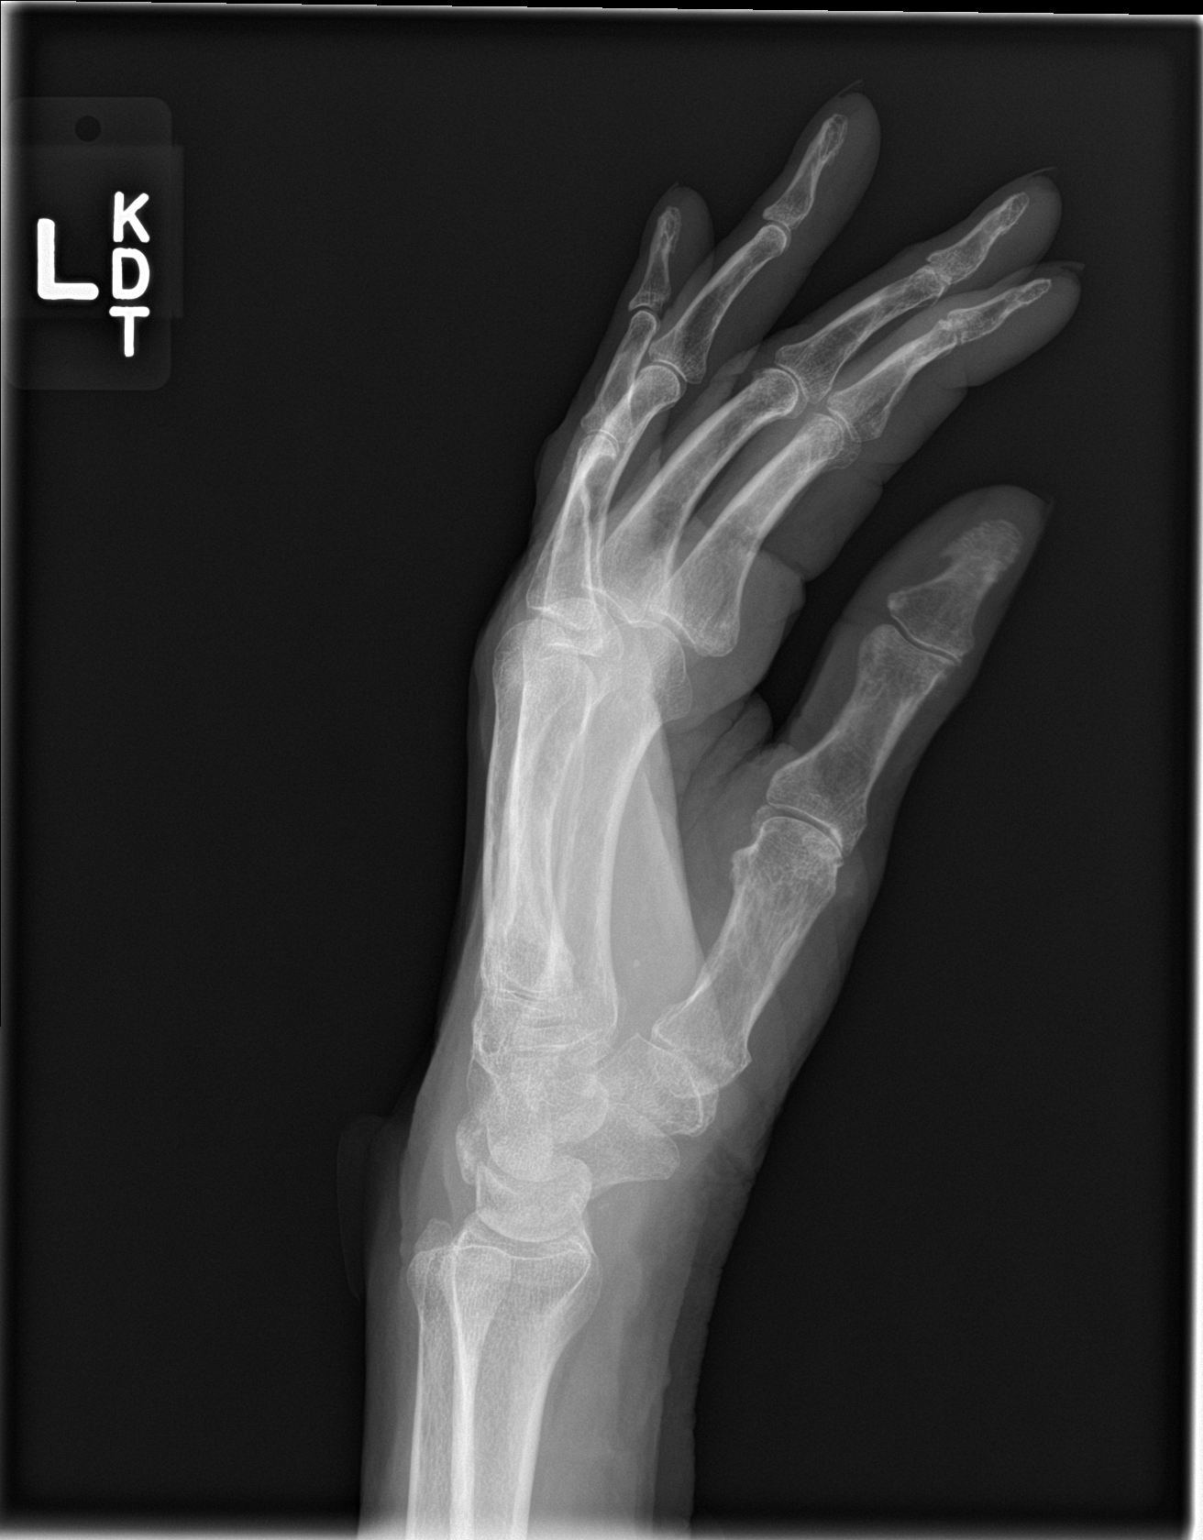

[3 of 3 positions shown; findings below may reference images not displayed]

FINDINGS: No evidence of acute fracture or dislocation. Osseous
mineralization. Mild narrowing of IP joint spaces involving all the
fingers and the thumb. Mild to moderate narrowing of the 1st MCP
joint space. Remaining joint spaces well preserved. Erosions with
overhanging edges involving the head of the middle phalanx of the
index finger and the head of the proximal phalanx of the small
finger. Partially calcified soft tissue tophus at the PIP joint of
the small finger.
IMPRESSION: 1. No acute osseous abnormality.
2. Findings consistent with mixed arthropathy consisting of mild
osteoarthritis and gout.
